# Patient Record
Sex: Male | Born: 1975
Health system: Southern US, Community
[De-identification: ages and names within clinical notes are randomized; demographics above are authoritative.]

## PROBLEM LIST (undated history)

## (undated) DIAGNOSIS — E781 Pure hyperglyceridemia: Secondary | ICD-10-CM

## (undated) DIAGNOSIS — G4730 Sleep apnea, unspecified: Secondary | ICD-10-CM

## (undated) DIAGNOSIS — F419 Anxiety disorder, unspecified: Secondary | ICD-10-CM

## (undated) DIAGNOSIS — C4490 Unspecified malignant neoplasm of skin, unspecified: Secondary | ICD-10-CM

## (undated) DIAGNOSIS — F32A Depression, unspecified: Secondary | ICD-10-CM

## (undated) DIAGNOSIS — K859 Acute pancreatitis without necrosis or infection, unspecified: Secondary | ICD-10-CM

## (undated) DIAGNOSIS — I10 Essential (primary) hypertension: Secondary | ICD-10-CM

## (undated) DIAGNOSIS — B019 Varicella without complication: Secondary | ICD-10-CM

## (undated) DIAGNOSIS — F329 Major depressive disorder, single episode, unspecified: Secondary | ICD-10-CM

## (undated) HISTORY — PX: LAPAROSCOPIC GASTRIC SLEEVE RESECTION: SHX5895

## (undated) HISTORY — PX: VASECTOMY: SHX75

## (undated) HISTORY — DX: Sleep apnea, unspecified: G47.30

## (undated) HISTORY — DX: Essential (primary) hypertension: I10

## (undated) HISTORY — PX: CHOLECYSTECTOMY: SHX55

## (undated) HISTORY — DX: Varicella without complication: B01.9

---

## 1997-10-11 DIAGNOSIS — G4730 Sleep apnea, unspecified: Secondary | ICD-10-CM

## 1997-10-11 HISTORY — DX: Sleep apnea, unspecified: G47.30

## 2001-10-11 HISTORY — PX: TONSILLECTOMY: SUR1361

## 2005-09-16 ENCOUNTER — Ambulatory Visit: Payer: Self-pay | Admitting: Urology

## 2006-10-11 HISTORY — PX: LUMBAR DISC SURGERY: SHX700

## 2007-02-20 ENCOUNTER — Emergency Department: Payer: Self-pay | Admitting: Emergency Medicine

## 2007-02-21 ENCOUNTER — Ambulatory Visit: Payer: Self-pay | Admitting: Family Medicine

## 2008-03-02 ENCOUNTER — Emergency Department: Payer: Self-pay | Admitting: Emergency Medicine

## 2008-03-04 ENCOUNTER — Inpatient Hospital Stay: Payer: Self-pay | Admitting: Internal Medicine

## 2010-09-27 ENCOUNTER — Inpatient Hospital Stay: Payer: Self-pay | Admitting: Internal Medicine

## 2011-05-11 ENCOUNTER — Inpatient Hospital Stay: Payer: Self-pay | Admitting: Student

## 2011-05-12 LAB — HM DIABETES EYE EXAM

## 2011-07-28 ENCOUNTER — Ambulatory Visit (INDEPENDENT_AMBULATORY_CARE_PROVIDER_SITE_OTHER): Payer: PRIVATE HEALTH INSURANCE | Admitting: Internal Medicine

## 2011-07-28 ENCOUNTER — Encounter: Payer: Self-pay | Admitting: Internal Medicine

## 2011-07-28 VITALS — BP 140/86 | HR 82 | Temp 98.9°F | Wt 247.0 lb

## 2011-07-28 DIAGNOSIS — G4730 Sleep apnea, unspecified: Secondary | ICD-10-CM | POA: Insufficient documentation

## 2011-07-28 DIAGNOSIS — F419 Anxiety disorder, unspecified: Secondary | ICD-10-CM

## 2011-07-28 DIAGNOSIS — E781 Pure hyperglyceridemia: Secondary | ICD-10-CM

## 2011-07-28 DIAGNOSIS — Z23 Encounter for immunization: Secondary | ICD-10-CM

## 2011-07-28 DIAGNOSIS — I10 Essential (primary) hypertension: Secondary | ICD-10-CM

## 2011-07-28 DIAGNOSIS — F411 Generalized anxiety disorder: Secondary | ICD-10-CM

## 2011-07-28 DIAGNOSIS — E119 Type 2 diabetes mellitus without complications: Secondary | ICD-10-CM

## 2011-07-28 MED ORDER — ALPRAZOLAM 0.5 MG PO TABS: 0.5000 mg | ORAL_TABLET | Freq: Every evening | ORAL | Status: AC | PRN

## 2011-07-28 MED ORDER — SERTRALINE HCL 50 MG PO TABS: 50.0000 mg | ORAL_TABLET | Freq: Every day | ORAL | Status: DC

## 2011-07-28 NOTE — Patient Instructions (Signed)
Start with 1/2 tab of sertraline daily for one week, then increase to a whole tablet.  Start with 1/2 tablet daily of alprazolam for intense emotional periods

## 2011-07-28 NOTE — Progress Notes (Signed)
  Subjective:    Patient ID: Jeanpaul Biehl, male    DOB: 1976/08/22, 35 y.o.   MRN: [id number]  HPI    Review of Systems     Objective:   Physical Exam        Assessment & Plan:   Subjective:     Ceaser Ebeling is a 35 y.o. male who presents for new evaluation and treatment of anxiety disorder. He has the following anxiety symptoms: fatigue, feelings of losing control, insomnia and irritable. Onset of symptoms was approximately 3 months ago. Symptoms have been gradually worsening since that time. He denies current suicidal and homicidal ideation. Family history significant for no psychiatric illness. Risk factors: none. Previous treatment includes none. He complains of the following medication side effects: none. The following portions of the patient's history were reviewed and updated as appropriate: allergies, current medications, past family history, past social history, past surgical history and problem list.  Review of Systems A comprehensive review of systems was negative.    Objective:    BP 140/86  Pulse 82  Temp(Src) 98.9 F (37.2 C) (Oral)  Wt 247 lb (112.038 kg)  SpO2 97% General appearance: alert, cooperative and appears stated age Head: Normocephalic, without obvious abnormality, atraumatic Eyes: conjunctivae/corneas clear. PERRL, EOM's intact. Fundi benign. Neck: no adenopathy, no carotid bruit, no JVD, supple, symmetrical, trachea midline and thyroid not enlarged, symmetric, no tenderness/mass/nodules Lungs: clear to auscultation bilaterally Heart: regular rate and rhythm, S1, S2 normal, no murmur, click, rub or gallop and regular rate and rhythm Abdomen: soft, non-tender; bowel sounds normal; no masses,  no organomegaly Extremities: extremities normal, atraumatic, no cyanosis or edema    Assessment:    anxiety disorder. Possible organic contributing causes are: endocrine/metabolic, nutritional.   Plan:    Medications: benzodiazepines alprazolam and  Zoloft.

## 2011-07-31 ENCOUNTER — Encounter: Payer: Self-pay | Admitting: Internal Medicine

## 2011-07-31 DIAGNOSIS — E781 Pure hyperglyceridemia: Secondary | ICD-10-CM | POA: Insufficient documentation

## 2011-07-31 DIAGNOSIS — E1149 Type 2 diabetes mellitus with other diabetic neurological complication: Secondary | ICD-10-CM | POA: Insufficient documentation

## 2011-07-31 DIAGNOSIS — IMO0002 Reserved for concepts with insufficient information to code with codable children: Secondary | ICD-10-CM | POA: Insufficient documentation

## 2011-07-31 DIAGNOSIS — I10 Essential (primary) hypertension: Secondary | ICD-10-CM | POA: Insufficient documentation

## 2011-07-31 NOTE — Assessment & Plan Note (Signed)
He takes fenofibrate for hypertriglyceridema.  fasting lipids due prior to next visit.

## 2011-07-31 NOTE — Assessment & Plan Note (Addendum)
Mildly elevated today, but he is very anxious.  Will reevaluate at next visit.

## 2011-07-31 NOTE — Assessment & Plan Note (Signed)
He is due for hgba1c priro to next visit

## 2011-08-30 ENCOUNTER — Encounter: Payer: Self-pay | Admitting: Internal Medicine

## 2011-08-30 ENCOUNTER — Ambulatory Visit (INDEPENDENT_AMBULATORY_CARE_PROVIDER_SITE_OTHER): Payer: PRIVATE HEALTH INSURANCE | Admitting: Internal Medicine

## 2011-08-30 DIAGNOSIS — E119 Type 2 diabetes mellitus without complications: Secondary | ICD-10-CM

## 2011-08-30 DIAGNOSIS — F411 Generalized anxiety disorder: Secondary | ICD-10-CM

## 2011-08-30 DIAGNOSIS — F419 Anxiety disorder, unspecified: Secondary | ICD-10-CM

## 2011-08-30 DIAGNOSIS — E781 Pure hyperglyceridemia: Secondary | ICD-10-CM

## 2011-08-30 MED ORDER — ALPRAZOLAM 0.5 MG PO TABS: 0.5000 mg | ORAL_TABLET | Freq: Every evening | ORAL | Status: DC | PRN

## 2011-08-30 NOTE — Assessment & Plan Note (Signed)
Managed by Dr. Eligah East at [facility] next one due in December

## 2011-08-30 NOTE — Patient Instructions (Signed)
First let's try taking zoloft in the morning with breakfast.  If this doesn't help, you may use the alprazolam at night as needed

## 2011-08-30 NOTE — Assessment & Plan Note (Signed)
Severe, with elevations to 550 0 aggravated by loss of glycemic control.  Managed by Hulda Marin at [facility] clinic with lovaza and gemfibrozil

## 2011-08-30 NOTE — Progress Notes (Signed)
Subjective:    Patient ID: Sergio [name], male    DOB: 1976-05-01, 35 y.o.   MRN: [id number]  HPIMr. Poe retursn for followup on initiation of SSRI therapy foe generalized anxiety.  He was started on zoloft and prn alprazolam one month ago and states that the difference in his mood is significant.  He takes the zoloft in the evening and has noted that occasionally he has used the alprzolam at night and has had imporved rest and wonders whtehter this can be continued safely.  He has decreased mood lability, decreased crying,  And decreased irritability with family.  Past Medical History  Diagnosis Date  . Chronic pancreatitis May 2009    secondary to hypertirglyceridemia   . Gastric ulcer 2009    by endo  . Sleep apnea 1999    uses CPAP, auto titrates,  [name]  . Chicken pox   . Diabetes mellitus   . HTN (hypertension)   . Hypertriglyceridemia     severe   Current Outpatient Prescriptions on File Prior to Visit  Medication Sig Dispense Refill  . aspirin EC 81 MG tablet Take 81 mg by mouth daily.        [name] gemfibrozil (LOPID) 600 MG tablet Take 600 mg by mouth 2 (two) times daily before a meal.        . insulin aspart (NOVOLOG) 100 UNIT/ML injection Inject into the skin 3 (three) times daily before meals. 12 units - for blood sugar 25 points over 150 take 2 additional units       . insulin glargine (LANTUS SOLOSTAR) 100 UNIT/ML injection Inject 45 Units into the skin at bedtime.        [name] lisinopril (PRINIVIL,ZESTRIL) 20 MG tablet Take 20 mg by mouth daily.        . metFORMIN (GLUCOPHAGE) 1000 MG tablet Take 1,000 mg by mouth 2 (two) times daily with a meal.        . omega-3 acid ethyl esters (LOVAZA) 1 G capsule Take 4 g by mouth daily.        [name] omeprazole (PRILOSEC) 20 MG capsule Take 20 mg by mouth daily.        . sertraline (ZOLOFT) 50 MG tablet Take 1 tablet (50 mg total) by mouth daily.  30 tablet  2    Review of Systems  Constitutional: Negative for fever, chills, diaphoresis,  activity change, appetite change, fatigue and unexpected weight change.  HENT: Negative for hearing loss, ear pain, nosebleeds, congestion, sore throat, facial swelling, rhinorrhea, sneezing, drooling, mouth sores, trouble swallowing, neck pain, neck stiffness, dental problem, voice change, postnasal drip, sinus pressure, tinnitus and ear discharge.   Eyes: Negative for photophobia, pain, discharge, redness, itching and visual disturbance.  Respiratory: Negative for apnea, cough, choking, chest tightness, shortness of breath, wheezing and stridor.   Cardiovascular: Negative for chest pain, palpitations and leg swelling.  Gastrointestinal: Negative for nausea, vomiting, abdominal pain, diarrhea, constipation, blood in stool, abdominal distention, anal bleeding and rectal pain.  Genitourinary: Negative for dysuria, urgency, frequency, hematuria, flank pain, decreased urine volume, scrotal swelling, difficulty urinating and testicular pain.  Musculoskeletal: Negative for myalgias, back pain, joint swelling, arthralgias and gait problem.  Skin: Negative for color change, rash and wound.  Neurological: Negative for dizziness, tremors, seizures, syncope, speech difficulty, weakness, light-headedness, numbness and headaches.  Psychiatric/Behavioral: Negative for suicidal ideas, hallucinations, behavioral problems, confusion, sleep disturbance, dysphoric mood, decreased concentration and agitation. The patient is not nervous/anxious.  Objective:   Physical Exam  Constitutional: He appears well-developed and well-nourished.  Eyes: Pupils are equal, round, and reactive to light.  Psychiatric: He has a normal mood and affect. His speech is normal and behavior is normal. Judgment and thought content normal. Cognition and memory are normal.          Assessment & Plan:  Anxiety:  improved with trial of zoloft 50 mg .  Since he is having mild sleep diminismnet.  Have suggested taking the zoloft in  the morning.  If this does not imporve the sleep, he can use the alrtazolam as needed for improvement in sleep.

## 2011-08-31 ENCOUNTER — Other Ambulatory Visit: Payer: Self-pay | Admitting: Internal Medicine

## 2011-08-31 NOTE — Telephone Encounter (Signed)
Ok to phone in refill of alprazolam

## 2011-11-03 ENCOUNTER — Other Ambulatory Visit: Payer: Self-pay | Admitting: Internal Medicine

## 2011-11-05 DIAGNOSIS — E781 Pure hyperglyceridemia: Secondary | ICD-10-CM | POA: Insufficient documentation

## 2012-03-17 ENCOUNTER — Other Ambulatory Visit: Payer: Self-pay | Admitting: Internal Medicine

## 2012-03-18 MED ORDER — ALPRAZOLAM 0.5 MG PO TABS: 0.5000 mg | ORAL_TABLET | Freq: Every evening | ORAL | Status: DC | PRN

## 2012-04-27 ENCOUNTER — Ambulatory Visit (INDEPENDENT_AMBULATORY_CARE_PROVIDER_SITE_OTHER): Payer: PRIVATE HEALTH INSURANCE | Admitting: Internal Medicine

## 2012-04-27 ENCOUNTER — Encounter: Payer: Self-pay | Admitting: Internal Medicine

## 2012-04-27 VITALS — BP 122/82 | HR 84 | Temp 98.8°F | Ht 70.0 in | Wt 265.0 lb

## 2012-04-27 DIAGNOSIS — E669 Obesity, unspecified: Secondary | ICD-10-CM

## 2012-04-27 DIAGNOSIS — I10 Essential (primary) hypertension: Secondary | ICD-10-CM

## 2012-04-27 DIAGNOSIS — E1165 Type 2 diabetes mellitus with hyperglycemia: Secondary | ICD-10-CM

## 2012-04-27 DIAGNOSIS — F411 Generalized anxiety disorder: Secondary | ICD-10-CM

## 2012-04-27 DIAGNOSIS — E781 Pure hyperglyceridemia: Secondary | ICD-10-CM

## 2012-04-27 DIAGNOSIS — IMO0002 Reserved for concepts with insufficient information to code with codable children: Secondary | ICD-10-CM

## 2012-04-27 DIAGNOSIS — IMO0001 Reserved for inherently not codable concepts without codable children: Secondary | ICD-10-CM

## 2012-04-27 NOTE — Patient Instructions (Addendum)
We are changing from gemfibrozil to trilipix once daily for triglycerides  .  No changes to insulin yet. We need one week of blood sugars  (fasting,  Pre meal and 2 hr post prandial same meal)    E Mail your sugar logs weekly using MyChart   Stay at current dose of zoloft for now.   Consider a Low Glycemic Index Diet and eating 6 smaller meals daily .  This frequent feeding stimulates your metabolism and the lower glycemic index foods will lower your blood sugars:   This is an example of my daily  "Low GI"  Diet:  All of the foods can be found at grocery stores and in bulk at [employer]  club   7 AM Breakfast:  Low carbohydrate Protein  Shakes (I recommend the EAS AdvantEdge "Carb Control" shakes  Or the low carb shakes by Atkins.   Both are available everywhere:  In  cases at [employer]  Or in 4 packs at grocery stores and pharmacies  2.5 carbs  (Alternative is  a toasted Arnold's Sandwhich Thin w/ peanut butter, a "Bagel Thin" with cream cheese and salmon) or  a scrambled egg burrito made with a low carb tortilla .  Avoid cereal and bananas, oatmeal too unless the old fashioned kind that takes 30-40 minutes to prepare.  the rest is overly processed, has minimal fiber, and loaded with carbohydrates!   10 AM: Protein bar by Atkins (the snack size, under 200 cal.  There are many varieties , available widely again or in bulk in limited varieties at [employer])  Other so called "protein bars" tend to be loaded with carbohydrates.  Remember, in food advertising, the word "energy" is synonymous for " carbohydrate."  Lunch: sandwich of [place], (or any lunchmeat or canned tuna), fresh avocado and cheese on a lower carbohydrate pita bread, flatbread, or tortilla . Ok to use mayonnaise. The bread is the only source or carbohydrate that can be decreased (Joseph's makes a pita bread and a flat bread  Are 50 cal and 4 net carbs ; Toufayan makes a low carb flatbread 100 cal and 9 net carbs  and  Mission makes a low carb whole wheat  tortilla  210 cal and 6 net carbs)  3 PM:  Mid day :  Another proteintttt bThe brear,  Or a  cheese stick (100 cal, 0 carbs),  Or 1 ounce of  almonds, walnuts, pistachios, pecans, peanuts,  Macadamia nuts. Or a Dannon light n Fit greek yogurt, 80 cal 8 net carbs . Avoid "granola"; the dried cranberries and raisins are loaded with carbohydrates.    6 PM  Dinner:  "mean and green:"  Meat/chicken/fish or a high protein legume; , with a green salad, and a low GI  Veggie (broccoli, cauliflower, green beans, spinach, brussel sprouts. Lima beans) : Avoid "Low fat dressings, Reyne Dumas and [address]! They are loaded with sugar! Instead use ranch, vinagrette,  Blue cheese, etc  9 PM snack : Breyer's "low carb" fudgsicle or  ice cream bar (Carb Smart line), or  Weight Watcher's ice cream bar , or anouther "no sugar added" ice cream; or another protein shake or a serving of fresh fruit with whipped cream (Avoid bananas, pineapple, grapes  and watermelon on a regular basis because they are high in sugar)   Remember that snack Substitutions should be less than 15 to 20 carbs  Per serving. Remember to subtract fiber grams to get the "net carbs."

## 2012-04-28 LAB — COMPREHENSIVE METABOLIC PANEL
ALT: 28 IU/L (ref 0–44)
AST: 18 IU/L (ref 0–40)
Albumin/Globulin Ratio: 1.4 (ref 1.1–2.5)
Alkaline Phosphatase: 48 IU/L (ref 44–102)
BUN/Creatinine Ratio: 12 (ref 8–19)
CO2: 19 mmol/L (ref 19–28)
Creatinine, Ser: 0.81 mg/dL (ref 0.76–1.27)
GFR calc non Af Amer: 116 mL/min/{1.73_m2} (ref >59)
Globulin, Total: 3.2 g/dL (ref 1.5–4.5)
Potassium: 4.4 mmol/L (ref 3.5–5.2)
Sodium: 138 mmol/L (ref 134–144)

## 2012-04-28 LAB — LIPID PANEL: HDL: 60 mg/dL (ref >39)

## 2012-04-28 LAB — MICROALBUMIN, URINE: Microalbumin, Urine: 126.9 ug/mL — ABNORMAL HIGH (ref 0.0–17.0)

## 2012-04-28 LAB — HEMOGLOBIN A1C
Est. average glucose Bld gHb Est-mCnc: 212 mg/dL
Hgb A1c MFr Bld: 9 % — ABNORMAL HIGH (ref 4.8–5.6)

## 2012-04-29 ENCOUNTER — Encounter: Payer: Self-pay | Admitting: Internal Medicine

## 2012-04-29 DIAGNOSIS — F411 Generalized anxiety disorder: Secondary | ICD-10-CM | POA: Insufficient documentation

## 2012-04-29 DIAGNOSIS — E669 Obesity, unspecified: Secondary | ICD-10-CM | POA: Insufficient documentation

## 2012-04-29 DIAGNOSIS — F419 Anxiety disorder, unspecified: Secondary | ICD-10-CM | POA: Insufficient documentation

## 2012-04-29 NOTE — Assessment & Plan Note (Signed)
I have addressed  BMI and recommended a low glycemic index diet utilizing smaller more frequent meals to increase metabolism.  I have also recommended that patient start exercising with a goal of 30 minutes of aerobic exercise a minimum of 5 days per week.  

## 2012-04-29 NOTE — Assessment & Plan Note (Signed)
Well-controlled. No medication changes today.

## 2012-04-29 NOTE — Progress Notes (Signed)
Patient ID: Sergio [name], male   DOB: [birth year], 37 y.o.   MRN: [id number]  Patient Active Problem List  Diagnosis  . Sleep apnea  . Diabetes mellitus type 2, uncontrolled  . Hypertriglyceridemia  . HTN (hypertension)    Subjective:  CC:   Chief Complaint  Patient presents with  . Follow-up    HPI:   Sergio [name] a 36 y.o. male who presents Uncontrolled diabetes and hypertriglyceridemia. Sergio [name] is accompanied by his wife Sergio [name] who is gravely concerned that his failure to control his diabetes and hyper lipidemia is going to cause his death. Both conditions are managed by Dr. Eligah East at [facility] the patient has not kept his follow up appointments with him. According to Sergio [name] he has been hospitalized several times for severe hypertriglyceridemia it causing pancreatitis. He was last seen by me in November for management of depression and was started on Zoloft. We brought him in today to have a family discussion. I suggested to him that the reason is not taking care of his illnesses is due to some underlying unresolved issue or in adequately treated depression. He reports today that he had been behaving in a passively suicidal way by not taking care of his diabetes and hypertriglyceridemia. He had been not himself or something he had done in during the past week he and his wife have discussed whatever this thing was and are getting past it.  He is remorseful today and crying a bit but appears to be emotionally stable.     Past Medical History  Diagnosis Date  . Chronic pancreatitis May 2009    secondary to hypertirglyceridemia   . Gastric ulcer 2009    by endo  . Sleep apnea 1999    uses CPAP, auto titrates,  Sergio [name])  . Chicken pox   . Diabetes mellitus   . HTN (hypertension)   . Hypertriglyceridemia     severe    Past Surgical History  Procedure Date  . Tonsillectomy 2003  . Vasectomy   . Lumbar disc surgery 2008         The following portions of the patient's history were  reviewed and updated as appropriate: Allergies, current medications, and problem list.    Review of Systems:   12 Pt  review of systems was negative except those addressed in the HPI,     History   Social History  . Marital Status: Married    Spouse Name: N/A    Number of Children: 3  . Years of Education: N/A   Occupational History  . Surveyor, minerals    Social History Main Topics  . Smoking status: Never Smoker   . Smokeless tobacco: Never Used  . Alcohol Use: Yes     once month  . Drug Use: No  . Sexually Active: Not on file   Other Topics Concern  . Not on file   Social History Narrative   Daily Caffeine Use:  2-3 week/SodaRegular Exercise -  NO    Objective:  BP 122/82  Pulse 84  Temp 98.8 F (37.1 C) (Oral)  Ht 5\' 10"  (1.778 m)  Wt 265 lb (120.203 kg)  BMI 38.02 kg/m2  SpO2 97%  General appearance: alert, cooperative and appears stated age Ears: normal TM's and external ear canals both ears Throat: lips, mucosa, and tongue normal; teeth and gums normal Neck: no adenopathy, no carotid bruit, supple, symmetrical, trachea midline and thyroid not enlarged, symmetric, no tenderness/mass/nodules Back:  symmetric, no curvature. ROM normal. No CVA tenderness. Lungs: clear to auscultation bilaterally Heart: regular rate and rhythm, S1, S2 normal, no murmur, click, rub or gallop Abdomen: soft, non-tender; bowel sounds normal; no masses,  no organomegaly Pulses: 2+ and symmetric Skin: Skin color, texture, turgor normal. No rashes or lesions Lymph nodes: Cervical, supraclavicular, and axillary nodes normal. Foot exam:  Nails are well trimmed,  No callouses,  Sensation intact to microfilament  Assessment and Plan:  HTN (hypertension) Well-controlled. No medication changes today.  Diabetes mellitus type 2, uncontrolled His Hemoglobin A1c is 9.0 on current regimen. We will not change his dose until we see a  week of blood sugars both fasting and pre-and post prandial.  He is taking metformin Lantus NovoLog sliding scale baby aspirin ACE inhibitor. Foot exam was done today. He has normal sensation.  Hypertriglyceridemia Severe with triglycerides of 1689 today. He was previously taking gemfibrozil.   I have given him samples of Trilipix which use to take daily and return in 6 weeks for repeat fasting lipids  Generalized anxiety disorder Improved since his reconciled with his wife over whatever incident he did in the past that has caused him so much emotional anguish  We will continue the Zoloft and and alprazolam for now.  Obesity (BMI 30-39.9) I have addressed  BMI and recommended a low glycemic index diet utilizing smaller more frequent meals to increase metabolism.  I have also recommended that patient start exercising with a goal of 30 minutes of aerobic exercise a minimum of 5 days per week.     Updated Medication List Outpatient Encounter Prescriptions as of 04/27/2012  Medication Sig Dispense Refill  . ALPRAZolam (XANAX) 0.5 MG tablet Take 1 tablet (0.5 mg total) by mouth at bedtime as needed.  30 tablet  5  . aspirin EC 81 MG tablet Take 81 mg by mouth daily.        . carvedilol (COREG) 6.25 MG tablet Take 6.25 mg by mouth 2 (two) times daily with a meal.        . insulin aspart (NOVOLOG) 100 UNIT/ML injection Inject into the skin 3 (three) times daily before meals. 12 units - for blood sugar 25 points over 150 take 2 additional units       . insulin glargine (LANTUS SOLOSTAR) 100 UNIT/ML injection Inject 45 Units into the skin at bedtime.        [name] lisinopril (PRINIVIL,ZESTRIL) 20 MG tablet Take 20 mg by mouth daily.        . metFORMIN (GLUCOPHAGE) 1000 MG tablet Take 1,000 mg by mouth 2 (two) times daily with a meal.        . omega-3 acid ethyl esters (LOVAZA) 1 G capsule Take 4 g by mouth daily.        [name] omeprazole (PRILOSEC) 20 MG capsule Take 20 mg by mouth daily.        . sertraline  (ZOLOFT) 50 MG tablet TAKE 1 TABLET EVERY DAY  30 tablet  6  . DISCONTD: gemfibrozil (LOPID) 600 MG tablet Take 600 mg by mouth 2 (two) times daily before a meal.       . DISCONTD: ALPRAZolam (XANAX) 0.5 MG tablet Take 1 tablet (0.5 mg total) by mouth at bedtime as needed.  30 tablet  5  . DISCONTD: ALPRAZolam (XANAX) 0.5 MG tablet TAKE ONE TABLET AT BEDTIME AS NEEDED    FOR SLEEP  30 tablet  3

## 2012-04-29 NOTE — Assessment & Plan Note (Signed)
Severe with triglycerides of 1689 today. He was previously taking gemfibrozil.   I have given him samples of Trilipix which use to take daily and return in 6 weeks for repeat fasting lipids

## 2012-04-29 NOTE — Assessment & Plan Note (Addendum)
His Hemoglobin A1c is 9.0 on current regimen. We will not change his dose until we see a week of blood sugars both fasting and pre-and post prandial.  He is taking metformin Lantus NovoLog sliding scale baby aspirin ACE inhibitor. Foot exam was done today. He has normal sensation.

## 2012-04-29 NOTE — Assessment & Plan Note (Signed)
Improved since his reconciled with his wife over whatever incident he did in the past that has caused him so much emotional anguish  We will continue the Zoloft and and alprazolam for now.

## 2012-04-30 ENCOUNTER — Encounter: Payer: Self-pay | Admitting: Internal Medicine

## 2012-05-29 ENCOUNTER — Ambulatory Visit (INDEPENDENT_AMBULATORY_CARE_PROVIDER_SITE_OTHER): Payer: PRIVATE HEALTH INSURANCE | Admitting: Internal Medicine

## 2012-05-29 ENCOUNTER — Encounter: Payer: Self-pay | Admitting: Internal Medicine

## 2012-05-29 VITALS — BP 110/70 | HR 80 | Temp 97.6°F | Resp 18 | Wt 249.0 lb

## 2012-05-29 DIAGNOSIS — R197 Diarrhea, unspecified: Secondary | ICD-10-CM

## 2012-05-29 DIAGNOSIS — K859 Acute pancreatitis without necrosis or infection, unspecified: Secondary | ICD-10-CM | POA: Insufficient documentation

## 2012-05-29 DIAGNOSIS — Z8719 Personal history of other diseases of the digestive system: Secondary | ICD-10-CM

## 2012-05-29 DIAGNOSIS — IMO0002 Reserved for concepts with insufficient information to code with codable children: Secondary | ICD-10-CM

## 2012-05-29 DIAGNOSIS — E1165 Type 2 diabetes mellitus with hyperglycemia: Secondary | ICD-10-CM

## 2012-05-29 DIAGNOSIS — IMO0001 Reserved for inherently not codable concepts without codable children: Secondary | ICD-10-CM

## 2012-05-29 MED ORDER — SERTRALINE HCL 100 MG PO TABS: 100.0000 mg | ORAL_TABLET | Freq: Every day | ORAL | Status: DC

## 2012-05-29 NOTE — Assessment & Plan Note (Signed)
Recent episode May 21 2012, admitted to [facility] , secondary to hypertriglyceridemia. Fenofribrate was changed to gemfibrozil, likely bc Sergio [name] presumed his lipid had not responded to Trilipix which he had only been taking since July 21

## 2012-05-29 NOTE — Patient Instructions (Signed)
  Increase the baseline aspart  dose from 12 units to 15 units as the base.   Stop the additional  Once your post prandials area all under 150 .  If you start having lows,  Reduce the baseline dose to 12 units again   Th alternative is to leave the base at 12 units and increase the Lantus to 50 units

## 2012-05-29 NOTE — Assessment & Plan Note (Signed)
hgba1c was 9.0 on July 21.  Will increase Lantus to 50 units from 45 given current post prandials in the 200 to 230 range.  continue sliding scale Apidra tid.

## 2012-05-29 NOTE — Assessment & Plan Note (Signed)
Secondary to loss of exocrine function, hopefully temporarily.  Advised to call if not resumed in a week or two.

## 2012-05-29 NOTE — Progress Notes (Signed)
Patient ID: Sergio [name], male   DOB: 1976/05/22, 36 y.o.   MRN: [id number]   Patient Active Problem List  Diagnosis  . Sleep apnea  . Diabetes mellitus type 2, uncontrolled  . Hypertriglyceridemia  . HTN (hypertension)  . Generalized anxiety disorder  . Obesity (BMI 30-39.9)  . Diarrhea  . History of pancreatitis    Subjective:  CC:   Chief Complaint  Patient presents with  . Follow-up    Hospital    HPI:   Sergio [name] a 36 y.o. male who presents Sergio [name] was admitted to [facility] one week ago yesterday for pancreatitis secondary to hypertriglyceridemia. He presented with severe abdominal pain and nausea without diuresis. He spent 4 days in the hospital on IV fluids and IV Dilaudid and had a case of respiratory depression secondary to overuse of his dilaudid PCA. He was discharged home on Thursday but has been staying with his parents since then. His medications were changed from Trilipix to gemfibrozil. He has an appointment with his lipidologist in the next 2-3 weeks. He has been having some postprandial diarrhea but not after every meal.  he has had pancreatitis in the past and the diarrhea has been a temporary condition.   His blood sugars have been ranging from from 150 fasting,  to 200 male and 250 postprandially.   He is using 45 units of Lantus daily and short acting insulin prior to each meal starting with 12 units at baseline and using a sliding scale for hyperglycemia.      Past Medical History  Diagnosis Date  . Chronic pancreatitis May 2009    secondary to hypertirglyceridemia   . Gastric ulcer 2009    by endo  . Sleep apnea 1999    uses CPAP, auto titrates,  Sergio [name])  . Chicken pox   . Diabetes mellitus   . HTN (hypertension)   . Hypertriglyceridemia     severe    Past Surgical History  Procedure Date  . Tonsillectomy 2003  . Vasectomy   . Lumbar disc surgery 2008         The following portions of the patient's history were  reviewed and updated as appropriate: Allergies, current medications, and problem list.    Review of Systems:   12 Pt  review of systems was negative except those addressed in the HPI,     History   Social History  . Marital Status: Married    Spouse Name: N/A    Number of Children: 3  . Years of Education: N/A   Occupational History  . Surveyor, minerals    Social History Main Topics  . Smoking status: Never Smoker   . Smokeless tobacco: Never Used  . Alcohol Use: Yes     once month  . Drug Use: No  . Sexually Active: Not on file   Other Topics Concern  . Not on file   Social History Narrative   Daily Caffeine Use:  2-3 week/SodaRegular Exercise -  NO    Objective:  BP 110/70  Pulse 80  Temp 97.6 F (36.4 C) (Oral)  Resp 18  Wt 249 lb (112.946 kg)  SpO2 97%  General appearance: alert, cooperative and appears stated age Ears: normal TM's and external ear canals both ears Throat: lips, mucosa, and tongue normal; teeth and gums normal Neck: no adenopathy, no carotid bruit, supple, symmetrical, trachea midline and thyroid not enlarged, symmetric, no tenderness/mass/nodules Back: symmetric, no  curvature. ROM normal. No CVA tenderness. Lungs: clear to auscultation bilaterally Heart: regular rate and rhythm, S1, S2 normal, no murmur, click, rub or gallop Abdomen: soft, non-tender; bowel sounds normal; no masses,  no organomegaly Pulses: 2+ and symmetric Skin: Skin color, texture, turgor normal. No rashes or lesions Lymph nodes: Cervical, supraclavicular, and axillary nodes normal.  Assessment and Plan:  History of pancreatitis Recent episode May 21 2012, admitted to [facility] , secondary to hypertriglyceridemia. Fenofribrate was changed to gemfibrozil, likely bc Sergio [name] presumed his lipid had not responded to Trilipix which he had only been taking since July 21   Diabetes mellitus type 2, uncontrolled hgba1c was  9.0 on July 21.  Will increase Lantus to 50 units from 45 given current post prandials in the 200 to 230 range.  continue sliding scale Apidra tid.   Diarrhea Secondary to loss of exocrine function, hopefully temporarily.  Advised to call if not resumed in a week or two.    Updated Medication List Outpatient Encounter Prescriptions as of 05/29/2012  Medication Sig Dispense Refill  . ALPRAZolam (XANAX) 0.5 MG tablet Take 1 tablet (0.5 mg total) by mouth at bedtime as needed.  30 tablet  5  . aspirin EC 81 MG tablet Take 81 mg by mouth daily.        . carvedilol (COREG) 6.25 MG tablet Take 6.25 mg by mouth 2 (two) times daily with a meal.        . gemfibrozil (LOPID) 600 MG tablet Take 600 mg by mouth 2 (two) times daily before a meal.      . insulin aspart (NOVOLOG) 100 UNIT/ML injection Inject into the skin 3 (three) times daily before meals. 12 units - for blood sugar 25 points over 150 take 2 additional units       . insulin glargine (LANTUS SOLOSTAR) 100 UNIT/ML injection Inject 45 Units into the skin at bedtime.        [name] lisinopril (PRINIVIL,ZESTRIL) 20 MG tablet Take 20 mg by mouth daily.        . metFORMIN (GLUCOPHAGE) 1000 MG tablet Take 1,000 mg by mouth 2 (two) times daily with a meal.        . omega-3 acid ethyl esters (LOVAZA) 1 G capsule Take 4 g by mouth daily.        [name] omeprazole (PRILOSEC) 20 MG capsule Take 20 mg by mouth daily.        . sertraline (ZOLOFT) 100 MG tablet Take 1 tablet (100 mg total) by mouth daily.  90 tablet  3  . DISCONTD: sertraline (ZOLOFT) 50 MG tablet TAKE 1 TABLET EVERY DAY  30 tablet  6

## 2012-06-30 ENCOUNTER — Ambulatory Visit (INDEPENDENT_AMBULATORY_CARE_PROVIDER_SITE_OTHER): Payer: PRIVATE HEALTH INSURANCE | Admitting: Internal Medicine

## 2012-06-30 ENCOUNTER — Encounter: Payer: Self-pay | Admitting: Internal Medicine

## 2012-06-30 VITALS — BP 140/80 | HR 88 | Temp 98.0°F | Resp 16 | Wt 258.5 lb

## 2012-06-30 DIAGNOSIS — R197 Diarrhea, unspecified: Secondary | ICD-10-CM

## 2012-06-30 DIAGNOSIS — IMO0002 Reserved for concepts with insufficient information to code with codable children: Secondary | ICD-10-CM

## 2012-06-30 DIAGNOSIS — Z8719 Personal history of other diseases of the digestive system: Secondary | ICD-10-CM

## 2012-06-30 DIAGNOSIS — IMO0001 Reserved for inherently not codable concepts without codable children: Secondary | ICD-10-CM

## 2012-06-30 DIAGNOSIS — E1165 Type 2 diabetes mellitus with hyperglycemia: Secondary | ICD-10-CM

## 2012-06-30 DIAGNOSIS — I10 Essential (primary) hypertension: Secondary | ICD-10-CM

## 2012-06-30 DIAGNOSIS — E781 Pure hyperglyceridemia: Secondary | ICD-10-CM

## 2012-06-30 LAB — LIPID PANEL: Total CHOL/HDL Ratio: 23

## 2012-06-30 NOTE — Assessment & Plan Note (Signed)
Improved by report of recent  blood sugars. He is not due for another  A1c until late October early November. He will return in December for followup.

## 2012-06-30 NOTE — Assessment & Plan Note (Signed)
Secondary to hypertriglyceridemia. Hospitalized at [facility] several months ago.  Sergio [name] He's currently asymptomatic but remains at high risk due to his hypertriglyceridemia.

## 2012-06-30 NOTE — Patient Instructions (Addendum)
I have given you samples of creon,  Which is a pancreatic enzyme supplement you take before each meal if you have diarrhea related to eating

## 2012-06-30 NOTE — Assessment & Plan Note (Signed)
Resolved, likely secondary to transient loss of exocrine function during pancreatitis episode. I have given her samples of Creon to use if he has  a recurrence of postprandial diarrhea.

## 2012-06-30 NOTE — Progress Notes (Signed)
Patient ID: Sergio [name], male   DOB: 14-May-1976, 36 y.o.   MRN: [id number]  Patient Active Problem List  Diagnosis  . Sleep apnea  . Diabetes mellitus type 2, uncontrolled  . Hypertriglyceridemia  . HTN (hypertension)  . Generalized anxiety disorder  . Obesity (BMI 30-39.9)  . Diarrhea  . History of pancreatitis    Subjective:  CC:   Chief Complaint  Patient presents with  . Follow-up    HPI:   Sergio [name] a 36 y.o. male who presents 1 month follow up on anxiety  Uncontrolled diabetes, hypertriglyceridemia and diarrhea. His diarrhea has completely resolved. His blood sugars are under better control and he reports that his morning sugars are averaging 1:30 to 140 with no hypoglycemic events. His pre-meal CBGs have been under 200 and he has rarely required more than 12 units of short acting before meals. He has gained some weight. His lipidologist, Dr. Eligah East wants him to follow a fat-free diet and reduce the size of his meals in order to lose weight. He says a fat-free diet is necessary because he is lacking lipase so he cannot metabolize the fat. His weight loss goal is been defined as 2 pounds per month per Dr. Eligah East.  He is  is requesting fasting lipids to be done today. morning sugars 130 to 140,  No lows.,  Pre meals have rarely required more than 12 units .  Has gained weight.  Dr. Eligah East wants him on a fat free diet and portion control.  Says he is lacking lipase so he cannot metabolize the fat. With goal of 2 lbs per month.  Regarding his depression he feels dramatically better on 100 mg of Zoloft. His wife and children going on vacation next week and he affect is markedly better today.  He is making good eye contact. He states that he is using alprazolam very infrequently less than 3 times per week. He is tolerating the Zoloft well.   Past Medical History  Diagnosis Date  . Chronic pancreatitis May 2009    secondary to hypertirglyceridemia   . Gastric ulcer 2009    by endo   . Sleep apnea 1999    uses CPAP, auto titrates,  [name]  . Chicken pox   . Diabetes mellitus   . HTN (hypertension)   . Hypertriglyceridemia     severe    Past Surgical History  Procedure Date  . Tonsillectomy 2003  . Vasectomy   . Lumbar disc surgery 2008         The following portions of the patient's history were reviewed and updated as appropriate: Allergies, current medications, and problem list.    Review of Systems:   12 Pt  review of systems was negative except those addressed in the HPI,     History   Social History  . Marital Status: Married    Spouse Name: N/A    Number of Children: 3  . Years of Education: N/A   Occupational History  . Surveyor, minerals    Social History Main Topics  . Smoking status: Never Smoker   . Smokeless tobacco: Never Used  . Alcohol Use: Yes     once month  . Drug Use: No  . Sexually Active: Not on file   Other Topics Concern  . Not on file   Social History Narrative   Daily Caffeine Use:  2-3 week/SodaRegular Exercise -  NO    Objective:  BP  140/80  Pulse 88  Temp 98 F (36.7 C) (Oral)  Resp 16  Wt 258 lb 8 oz (117.255 kg)  SpO2 97%  General appearance: alert, cooperative and appears stated age Ears: normal TM's and external ear canals both ears Throat: lips, mucosa, and tongue normal; teeth and gums normal Neck: no adenopathy, no carotid bruit, supple, symmetrical, trachea midline and thyroid not enlarged, symmetric, no tenderness/mass/nodules Back: symmetric, no curvature. ROM normal. No CVA tenderness. Lungs: clear to auscultation bilaterally Heart: regular rate and rhythm, S1, S2 normal, no murmur, click, rub or gallop Abdomen: soft, non-tender; bowel sounds normal; no masses,  no organomegaly Pulses: 2+ and symmetric Skin: Skin color, texture, turgor normal. No rashes or lesions Lymph nodes: Cervical, supraclavicular, and axillary nodes  normal.  Assessment and Plan:  Diabetes mellitus type 2, uncontrolled Improved by report of recent  blood sugars. He is not due for another  A1c until late October early November. He will return in December for followup.  Hypertriglyceridemia Lipids were retested today and his manual triglycerides are 10,160.  He is taking gemfibrozil and has been instructed to follow a fat free diet by Dr. Eligah East. He remains at high risk for recurrent pancreatitis.  HTN (hypertension) well controlled   Diarrhea Resolved, likely secondary to transient loss of exocrine function during pancreatitis episode. I have given her samples of Creon to use if he has  a recurrence of postprandial diarrhea.  History of pancreatitis Secondary to hypertriglyceridemia. Hospitalized at [facility] several months ago.  [name] He's currently asymptomatic but remains at high risk due to his hypertriglyceridemia.   Updated Medication List Outpatient Encounter Prescriptions as of 06/30/2012  Medication Sig Dispense Refill  . ALPRAZolam (XANAX) 0.5 MG tablet Take 1 tablet (0.5 mg total) by mouth at bedtime as needed.  30 tablet  5  . aspirin EC 81 MG tablet Take 81 mg by mouth daily.        . carvedilol (COREG) 6.25 MG tablet Take 6.25 mg by mouth 2 (two) times daily with a meal.        . gemfibrozil (LOPID) 600 MG tablet Take 600 mg by mouth 2 (two) times daily before a meal.      . insulin aspart (NOVOLOG) 100 UNIT/ML injection Inject into the skin 3 (three) times daily before meals. 12 units - for blood sugar 25 points over 150 take 2 additional units       . insulin glargine (LANTUS SOLOSTAR) 100 UNIT/ML injection Inject 50 Units into the skin at bedtime.       [name] lisinopril (PRINIVIL,ZESTRIL) 20 MG tablet Take 20 mg by mouth daily.        . metFORMIN (GLUCOPHAGE) 1000 MG tablet Take 1,000 mg by mouth 2 (two) times daily with a meal.        . omega-3 acid ethyl esters (LOVAZA) 1 G capsule Take 4 g by mouth daily.        [name] omeprazole  (PRILOSEC) 20 MG capsule Take 20 mg by mouth daily.        . sertraline (ZOLOFT) 100 MG tablet Take 1 tablet (100 mg total) by mouth daily.  90 tablet  3     Orders Placed This Encounter  Procedures  . LDL cholesterol, direct  . Lipid panel    No Follow-up on file.

## 2012-06-30 NOTE — Assessment & Plan Note (Signed)
well controlled  

## 2012-06-30 NOTE — Assessment & Plan Note (Signed)
Lipids were retested today and his manual triglycerides are 10,160.  He is taking gemfibrozil and has been instructed to follow a fat free diet by Dr. Eligah East. He remains at high risk for recurrent pancreatitis.

## 2012-08-17 ENCOUNTER — Telehealth: Payer: Self-pay

## 2012-08-17 ENCOUNTER — Ambulatory Visit (INDEPENDENT_AMBULATORY_CARE_PROVIDER_SITE_OTHER): Payer: No Typology Code available for payment source | Admitting: Psychology

## 2012-08-17 DIAGNOSIS — F331 Major depressive disorder, recurrent, moderate: Secondary | ICD-10-CM

## 2012-08-17 NOTE — Telephone Encounter (Signed)
Patient stated that he went to [facility] and saw the physcian that he was referred to see and she wants you to call her to discuss some medication changes for patient.

## 2012-08-21 ENCOUNTER — Inpatient Hospital Stay: Payer: Self-pay | Admitting: Internal Medicine

## 2012-08-21 LAB — CBC
HCT: 38.2 % — ABNORMAL LOW (ref 40.0–52.0)
MCV: 76 fL — ABNORMAL LOW (ref 80–100)
Platelet: 296 10*3/uL (ref 150–440)
RBC: 5.03 10*6/uL (ref 4.40–5.90)
RDW: 16.9 % — ABNORMAL HIGH (ref 11.5–14.5)
WBC: 7.8 10*3/uL (ref 3.8–10.6)

## 2012-08-21 LAB — LIPASE, BLOOD: Lipase: 451 U/L — ABNORMAL HIGH (ref 73–393)

## 2012-08-21 LAB — COMPREHENSIVE METABOLIC PANEL
Alkaline Phosphatase: 68 U/L (ref 50–136)
Anion Gap: 8 (ref 7–16)
BUN: 11 mg/dL (ref 7–18)
Bilirubin,Total: 0.3 mg/dL (ref 0.2–1.0)
Co2: 25 mmol/L (ref 21–32)
Creatinine: 0.78 mg/dL (ref 0.60–1.30)
EGFR (African American): >60
EGFR (Non-African Amer.): >60
Glucose: 270 mg/dL — ABNORMAL HIGH (ref 65–99)
SGOT(AST): 20 U/L (ref 15–37)
SGPT (ALT): 28 U/L (ref 12–78)
Sodium: 133 mmol/L — ABNORMAL LOW (ref 136–145)

## 2012-08-21 LAB — URINALYSIS, COMPLETE
Bilirubin,UR: NEGATIVE
Blood: NEGATIVE
Glucose,UR: >=500 mg/dL (ref 0–75)
Leukocyte Esterase: NEGATIVE
Nitrite: NEGATIVE
RBC,UR: <1 /HPF (ref 0–5)
Specific Gravity: 1.027 (ref 1.003–1.030)
Squamous Epithelial: <1
WBC UR: 1 /HPF (ref 0–5)

## 2012-08-21 LAB — LIPID PANEL
HDL Cholesterol: 20 mg/dL — ABNORMAL LOW (ref 40–60)
Triglycerides: 710 mg/dL — ABNORMAL HIGH (ref 0–200)

## 2012-08-21 LAB — LACTATE DEHYDROGENASE: LDH: 123 U/L (ref 85–241)

## 2012-08-21 NOTE — Telephone Encounter (Signed)
Behavioral phyiscian name is Evalina Field at the [facility] office, [phone]

## 2012-08-21 NOTE — Telephone Encounter (Signed)
Please get me her phone number so I can call after hours,  or have her fax me her comments.

## 2012-08-22 LAB — BASIC METABOLIC PANEL
Anion Gap: 12 (ref 7–16)
Co2: 22 mmol/L (ref 21–32)
EGFR (African American): >60
EGFR (Non-African Amer.): >60
Osmolality: 277 (ref 275–301)
Sodium: 136 mmol/L (ref 136–145)

## 2012-08-22 LAB — CBC WITH DIFFERENTIAL/PLATELET
Basophil #: 0 10*3/uL (ref 0.0–0.1)
Basophil %: 0.4 %
Eosinophil #: 0.1 10*3/uL (ref 0.0–0.7)
Eosinophil %: 1.2 %
HCT: 33.8 % — ABNORMAL LOW (ref 40.0–52.0)
HGB: 12.3 g/dL — ABNORMAL LOW (ref 13.0–18.0)
Lymphocyte #: 2.1 10*3/uL (ref 1.0–3.6)
Lymphocyte %: 24.7 %
MCH: 28.1 pg (ref 26.0–34.0)
MCHC: 36.6 g/dL — ABNORMAL HIGH (ref 32.0–36.0)
MCV: 77 fL — ABNORMAL LOW (ref 80–100)
Monocyte #: 0.4 x10 3/mm (ref 0.2–1.0)
Monocyte %: 5.1 %
Neutrophil #: 5.9 10*3/uL (ref 1.4–6.5)
Neutrophil %: 68.6 %
Platelet: 304 10*3/uL (ref 150–440)
RBC: 4.39 10*6/uL — ABNORMAL LOW (ref 4.40–5.90)
RDW: 17.1 % — ABNORMAL HIGH (ref 11.5–14.5)
WBC: 9.6 10*3/uL (ref 3.8–10.6)

## 2012-08-22 LAB — LIPASE, BLOOD: Lipase: 200 U/L (ref 73–393)

## 2012-08-23 LAB — CALCIUM: Calcium, Total: 8.6 mg/dL (ref 8.5–10.1)

## 2012-08-24 LAB — BASIC METABOLIC PANEL
Anion Gap: 10 (ref 7–16)
Calcium, Total: 8.2 mg/dL — ABNORMAL LOW (ref 8.5–10.1)
Creatinine: 0.45 mg/dL — ABNORMAL LOW (ref 0.60–1.30)
EGFR (African American): >60
Glucose: 113 mg/dL — ABNORMAL HIGH (ref 65–99)
Potassium: 3.9 mmol/L (ref 3.5–5.1)

## 2012-08-25 LAB — COMPREHENSIVE METABOLIC PANEL
Anion Gap: 9 (ref 7–16)
Bilirubin,Total: 0.4 mg/dL (ref 0.2–1.0)
Calcium, Total: 8.2 mg/dL — ABNORMAL LOW (ref 8.5–10.1)
Chloride: 103 mmol/L (ref 98–107)
Co2: 24 mmol/L (ref 21–32)
Creatinine: 0.69 mg/dL (ref 0.60–1.30)
EGFR (African American): >60
EGFR (Non-African Amer.): >60
Glucose: 108 mg/dL — ABNORMAL HIGH (ref 65–99)
Osmolality: 271 (ref 275–301)
Potassium: 3.9 mmol/L (ref 3.5–5.1)
Sodium: 136 mmol/L (ref 136–145)
Total Protein: 7.3 g/dL (ref 6.4–8.2)

## 2012-08-25 LAB — TRIGLYCERIDES: Triglycerides: 1495 mg/dL — ABNORMAL HIGH (ref 0–200)

## 2012-08-27 LAB — TRIGLYCERIDES: Triglycerides: 961 mg/dL — ABNORMAL HIGH (ref 0–200)

## 2012-08-28 LAB — TRIGLYCERIDES: Triglycerides: 813 mg/dL — ABNORMAL HIGH (ref 0–200)

## 2012-09-02 ENCOUNTER — Ambulatory Visit (INDEPENDENT_AMBULATORY_CARE_PROVIDER_SITE_OTHER): Payer: No Typology Code available for payment source | Admitting: Psychology

## 2012-09-02 DIAGNOSIS — F331 Major depressive disorder, recurrent, moderate: Secondary | ICD-10-CM

## 2012-09-13 ENCOUNTER — Ambulatory Visit (INDEPENDENT_AMBULATORY_CARE_PROVIDER_SITE_OTHER): Payer: No Typology Code available for payment source | Admitting: Psychology

## 2012-09-13 DIAGNOSIS — F331 Major depressive disorder, recurrent, moderate: Secondary | ICD-10-CM

## 2012-09-18 ENCOUNTER — Ambulatory Visit: Payer: PRIVATE HEALTH INSURANCE | Admitting: Internal Medicine

## 2012-09-20 ENCOUNTER — Ambulatory Visit (INDEPENDENT_AMBULATORY_CARE_PROVIDER_SITE_OTHER): Payer: No Typology Code available for payment source | Admitting: Psychology

## 2012-09-20 DIAGNOSIS — F331 Major depressive disorder, recurrent, moderate: Secondary | ICD-10-CM

## 2012-09-21 ENCOUNTER — Ambulatory Visit: Payer: Self-pay | Admitting: Psychology

## 2012-09-28 ENCOUNTER — Ambulatory Visit: Payer: No Typology Code available for payment source | Admitting: Psychology

## 2012-10-03 ENCOUNTER — Ambulatory Visit: Payer: No Typology Code available for payment source | Admitting: Psychology

## 2012-10-12 ENCOUNTER — Ambulatory Visit (INDEPENDENT_AMBULATORY_CARE_PROVIDER_SITE_OTHER): Payer: No Typology Code available for payment source | Admitting: Psychology

## 2012-10-12 DIAGNOSIS — F331 Major depressive disorder, recurrent, moderate: Secondary | ICD-10-CM

## 2012-11-08 ENCOUNTER — Ambulatory Visit: Payer: No Typology Code available for payment source | Admitting: Psychology

## 2012-11-15 ENCOUNTER — Ambulatory Visit (INDEPENDENT_AMBULATORY_CARE_PROVIDER_SITE_OTHER): Payer: No Typology Code available for payment source | Admitting: Psychology

## 2012-11-15 DIAGNOSIS — F331 Major depressive disorder, recurrent, moderate: Secondary | ICD-10-CM

## 2012-11-22 ENCOUNTER — Ambulatory Visit: Payer: No Typology Code available for payment source | Admitting: Psychology

## 2012-11-26 ENCOUNTER — Other Ambulatory Visit: Payer: Self-pay

## 2012-11-29 ENCOUNTER — Ambulatory Visit (INDEPENDENT_AMBULATORY_CARE_PROVIDER_SITE_OTHER): Payer: No Typology Code available for payment source | Admitting: Psychology

## 2012-11-29 DIAGNOSIS — F509 Eating disorder, unspecified: Secondary | ICD-10-CM

## 2012-12-06 ENCOUNTER — Ambulatory Visit (INDEPENDENT_AMBULATORY_CARE_PROVIDER_SITE_OTHER): Payer: No Typology Code available for payment source | Admitting: Psychology

## 2012-12-06 DIAGNOSIS — F509 Eating disorder, unspecified: Secondary | ICD-10-CM

## 2012-12-12 ENCOUNTER — Encounter: Payer: Self-pay | Admitting: Internal Medicine

## 2012-12-12 ENCOUNTER — Ambulatory Visit: Payer: Self-pay | Admitting: Internal Medicine

## 2012-12-12 ENCOUNTER — Ambulatory Visit (INDEPENDENT_AMBULATORY_CARE_PROVIDER_SITE_OTHER): Payer: PRIVATE HEALTH INSURANCE | Admitting: Internal Medicine

## 2012-12-12 VITALS — BP 140/102 | HR 88 | Temp 98.2°F | Wt 259.0 lb

## 2012-12-12 DIAGNOSIS — E1165 Type 2 diabetes mellitus with hyperglycemia: Secondary | ICD-10-CM

## 2012-12-12 DIAGNOSIS — IMO0002 Reserved for concepts with insufficient information to code with codable children: Secondary | ICD-10-CM

## 2012-12-12 DIAGNOSIS — IMO0001 Reserved for inherently not codable concepts without codable children: Secondary | ICD-10-CM

## 2012-12-12 DIAGNOSIS — E781 Pure hyperglyceridemia: Secondary | ICD-10-CM

## 2012-12-12 MED ORDER — INSULIN ASPART PROT & ASPART (70-30 MIX) 100 UNIT/ML ~~LOC~~ SUSP: SUBCUTANEOUS | Status: DC

## 2012-12-12 MED ORDER — GABAPENTIN 300 MG PO CAPS: 300.0000 mg | ORAL_CAPSULE | Freq: Three times a day (TID) | ORAL | Status: DC

## 2012-12-12 NOTE — Patient Instructions (Addendum)
We are starting gabapentin (generic Neurontin)  300 mg at bedtime to manage the neuropathy  continue lantus  Start 70/30 insulin for twice daily use:  Start with  20 units before breakfast and 30 before dinner, instead of the short acting insulin   We will adjust your doses weekly based on your blood sugars   Please get fasting labs done this week and fax to me at [phone]

## 2012-12-12 NOTE — Assessment & Plan Note (Signed)
With medication noncompiance resulting in recurrent pancreatitis.  Return for fasting lipids,  tricor changed to gemfribrozil

## 2012-12-12 NOTE — Progress Notes (Signed)
Patient ID: Sergio [name], male   DOB: 1976-08-30, 37 y.o.   MRN: [id number]  Patient Active Problem List  Diagnosis  . Sleep apnea  . DM (diabetes mellitus), type 2, uncontrolled w/neurologic complication  . Hypertriglyceridemia  . HTN (hypertension)  . Generalized anxiety disorder  . Obesity (BMI 30-39.9)  . Diarrhea  . History of pancreatitis    Subjective:  CC:   Chief Complaint  Patient presents with  . Pain    Pain in his hands and feet, interferring with him sleeping at night    HPI:   Sergio [name] a 37 y.o. male who presents with bilateral foot pain burning   For the past month. Has been moving up the leg.  Has some numbness due to  Back issues.  Has been noncompliant with tid insulin and also with her insulin and cholesterol medications  His sugars have been averaging around 350.    Past Medical History  Diagnosis Date  . Chronic pancreatitis May 2009    secondary to hypertirglyceridemia   . Gastric ulcer 2009    by endo  . Sleep apnea 1999    uses CPAP, auto titrates,  [name]  . Chicken pox   . Diabetes mellitus   . HTN (hypertension)   . Hypertriglyceridemia     severe    Past Surgical History  Procedure Laterality Date  . Tonsillectomy  2003  . Vasectomy    . Lumbar disc surgery  2008       The following portions of the patient's history were reviewed and updated as appropriate: Allergies, current medications, and problem list.    Review of Systems:    Patient denies headache, fevers, malaise, unintentional weight loss, skin rash, eye pain, sinus congestion and sinus pain, sore throat, dysphagia,  hemoptysis , cough, dyspnea, wheezing, chest pain, palpitations, orthopnea, edema, abdominal pain, nausea, melena, diarrhea, constipation, flank pain, dysuria, hematuria, urinary  Frequency, nocturia, numbness, tingling, seizures,  Focal weakness, Loss of consciousness,  Tremor, insomnia, depression, anxiety, and suicidal ideation.      History    Social History  . Marital Status: Married    Spouse Name: N/A    Number of Children: 3  . Years of Education: N/A   Occupational History  . Surveyor, minerals    Social History Main Topics  . Smoking status: Never Smoker   . Smokeless tobacco: Never Used  . Alcohol Use: Yes     Comment: once month  . Drug Use: No  . Sexually Active: Not on file   Other Topics Concern  . Not on file   Social History Narrative   Daily Caffeine Use:  2-3 week/Soda   Regular Exercise -  NO    Objective:  BP 140/102  Pulse 88  Temp(Src) 98.2 F (36.8 C) (Oral)  Wt 259 lb (117.482 kg)  BMI 37.16 kg/m2  SpO2 95%  General appearance: alert, cooperative and appears stated age Ears: normal TM's and external ear canals both ears Throat: lips, mucosa, and tongue normal; teeth and gums normal Neck: no adenopathy, no carotid bruit, supple, symmetrical, trachea midline and thyroid not enlarged, symmetric, no tenderness/mass/nodules Back: symmetric, no curvature. ROM normal. No CVA tenderness. Lungs: clear to auscultation bilaterally Heart: regular rate and rhythm, S1, S2 normal, no murmur, click, rub or gallop Abdomen: soft, non-tender; bowel sounds normal; no masses,  no organomegaly Pulses: 2+ and symmetric Skin: Skin color, texture, turgor normal. No rashes or lesions  Lymph nodes: Cervical, supraclavicular, and axillary nodes normal. Foot exam:  Nails are well trimmed,  No callouses,  Sensation is intact in 5 of 10 locations bilaterally    Assessment and Plan:  Hypertriglyceridemia With medication noncompiance resulting in recurrent pancreatitis.  Return for fasting lipids,  tricor changed to gemfribrozil   DM (diabetes mellitus), type 2, uncontrolled w/neurologic complication Changing tid lispro to 70/30 bid 20 and 30 units/  Continue Lantus. Adjust insulin via mychart weekly. neurontin for new onset neuropathy   Updated  Medication List Outpatient Encounter Prescriptions as of 12/12/2012  Medication Sig Dispense Refill  . ALPRAZolam (XANAX) 0.5 MG tablet Take 1 tablet (0.5 mg total) by mouth at bedtime as needed.  30 tablet  5  . aspirin EC 81 MG tablet Take 81 mg by mouth daily.        . carvedilol (COREG) 6.25 MG tablet Take 6.25 mg by mouth 2 (two) times daily with a meal.        . gemfibrozil (LOPID) 600 MG tablet Take 600 mg by mouth 2 (two) times daily before a meal.      . insulin aspart (NOVOLOG) 100 UNIT/ML injection Inject into the skin 3 (three) times daily before meals. 12 units - for blood sugar 25 points over 150 take 2 additional units       . insulin glargine (LANTUS SOLOSTAR) 100 UNIT/ML injection Inject 50 Units into the skin at bedtime.       [name] lisinopril (PRINIVIL,ZESTRIL) 20 MG tablet Take 20 mg by mouth daily.        . metFORMIN (GLUCOPHAGE) 1000 MG tablet Take 1,000 mg by mouth 2 (two) times daily with a meal.        . omega-3 acid ethyl esters (LOVAZA) 1 G capsule Take 4 g by mouth daily.        [name] omeprazole (PRILOSEC) 20 MG capsule Take 20 mg by mouth daily.        . sertraline (ZOLOFT) 100 MG tablet Take 1 tablet (100 mg total) by mouth daily.  90 tablet  3  . gabapentin (NEURONTIN) 300 MG capsule Take 1 capsule (300 mg total) by mouth 3 (three) times daily.  90 capsule  3  . insulin aspart protamine-insulin aspart (NOVOLOG MIX 70/30 FLEXPEN) (70-30) 100 UNIT/ML injection 20 units before breakfast ,  30 units before supper  15 mL  12   No facility-administered encounter medications on file as of 12/12/2012.     No orders of the defined types were placed in this encounter.    No Follow-up on file.

## 2012-12-12 NOTE — Assessment & Plan Note (Addendum)
Changing tid lispro to 70/30 bid 20 and 30 units/  Continue Lantus. Adjust insulin via mychart weekly. neurontin for new onset neuropathy

## 2012-12-14 ENCOUNTER — Encounter: Payer: Self-pay | Admitting: Internal Medicine

## 2013-01-22 ENCOUNTER — Other Ambulatory Visit: Payer: Self-pay | Admitting: *Deleted

## 2013-01-23 MED ORDER — ALPRAZOLAM 0.5 MG PO TABS: 0.5000 mg | ORAL_TABLET | Freq: Every evening | ORAL | Status: DC | PRN

## 2013-01-23 NOTE — Telephone Encounter (Signed)
Ok to refill,  Authorized in epic 

## 2013-01-24 NOTE — Telephone Encounter (Signed)
Refill faxed to pharmacy on 01/23/13

## 2013-02-14 LAB — CBC
HCT: 34.3 % — ABNORMAL LOW (ref 40.0–52.0)
MCV: 75 fL — ABNORMAL LOW (ref 80–100)
RBC: 4.58 10*6/uL (ref 4.40–5.90)

## 2013-02-14 LAB — COMPREHENSIVE METABOLIC PANEL
Albumin: 3.9 g/dL (ref 3.4–5.0)
Alkaline Phosphatase: 54 U/L (ref 50–136)
Anion Gap: 10 (ref 7–16)
BUN: 11 mg/dL (ref 7–18)
Bilirubin,Total: 0.3 mg/dL (ref 0.2–1.0)
Calcium, Total: 8.9 mg/dL (ref 8.5–10.1)
Chloride: 101 mmol/L (ref 98–107)
Co2: 25 mmol/L (ref 21–32)
EGFR (African American): >60
EGFR (Non-African Amer.): >60
Glucose: 279 mg/dL — ABNORMAL HIGH (ref 65–99)
Osmolality: 281 (ref 275–301)
SGOT(AST): 24 U/L (ref 15–37)
Total Protein: 8 g/dL (ref 6.4–8.2)

## 2013-02-14 LAB — LIPASE, BLOOD: Lipase: >3000 U/L (ref 73–393)

## 2013-02-15 ENCOUNTER — Inpatient Hospital Stay: Payer: Self-pay | Admitting: Student

## 2013-02-15 LAB — BASIC METABOLIC PANEL
BUN: 8 mg/dL (ref 7–18)
Calcium, Total: 6.4 mg/dL — CL (ref 8.5–10.1)
Co2: 25 mmol/L (ref 21–32)
EGFR (African American): >60
EGFR (Non-African Amer.): >60
Glucose: 179 mg/dL — ABNORMAL HIGH (ref 65–99)
Sodium: 136 mmol/L (ref 136–145)

## 2013-02-15 LAB — LIPID PANEL: Cholesterol: 409 mg/dL — ABNORMAL HIGH (ref 0–200)

## 2013-02-15 LAB — HEMOGLOBIN: HGB: 14.7 g/dL (ref 13.0–18.0)

## 2013-02-16 LAB — CBC WITH DIFFERENTIAL/PLATELET
Basophil %: 0.4 %
Eosinophil #: 0 10*3/uL (ref 0.0–0.7)
HCT: 36.1 % — ABNORMAL LOW (ref 40.0–52.0)
Lymphocyte #: 1.3 10*3/uL (ref 1.0–3.6)
MCH: 26.9 pg (ref 26.0–34.0)
MCHC: 35 g/dL (ref 32.0–36.0)
Monocyte %: 5 %
Neutrophil %: 78.3 %
Platelet: 232 10*3/uL (ref 150–440)
RDW: 17 % — ABNORMAL HIGH (ref 11.5–14.5)
WBC: 8.3 10*3/uL (ref 3.8–10.6)

## 2013-02-16 LAB — BASIC METABOLIC PANEL
BUN: 7 mg/dL (ref 7–18)
Calcium, Total: 6.9 mg/dL — CL (ref 8.5–10.1)
Co2: 24 mmol/L (ref 21–32)
Creatinine: 0.83 mg/dL (ref 0.60–1.30)
EGFR (African American): >60
EGFR (Non-African Amer.): >60
EGFR (Non-African Amer.): >60
Glucose: 209 mg/dL — ABNORMAL HIGH (ref 65–99)
Potassium: 3.6 mmol/L (ref 3.5–5.1)
Potassium: 3.5 mmol/L (ref 3.5–5.1)
Sodium: 136 mmol/L (ref 136–145)
Sodium: 133 mmol/L — ABNORMAL LOW (ref 136–145)

## 2013-02-16 LAB — URINALYSIS, COMPLETE
Bacteria: NONE SEEN
Blood: NEGATIVE
Glucose,UR: >=500 mg/dL (ref 0–75)
Nitrite: NEGATIVE
WBC UR: 4 /HPF (ref 0–5)

## 2013-02-17 LAB — CBC WITH DIFFERENTIAL/PLATELET
Basophil #: 0.1 10*3/uL (ref 0.0–0.1)
Basophil %: 1.1 %
Eosinophil %: 0 %
HCT: 33.6 % — ABNORMAL LOW (ref 40.0–52.0)
Lymphocyte #: 0.5 10*3/uL — ABNORMAL LOW (ref 1.0–3.6)
Lymphocyte %: 6.4 %
MCH: 28.2 pg (ref 26.0–34.0)
MCHC: 36.5 g/dL — ABNORMAL HIGH (ref 32.0–36.0)
Monocyte #: 0.6 x10 3/mm (ref 0.2–1.0)
Neutrophil #: 6.6 10*3/uL — ABNORMAL HIGH (ref 1.4–6.5)
Neutrophil %: 84.3 %
RBC: 4.35 10*6/uL — ABNORMAL LOW (ref 4.40–5.90)
WBC: 7.8 10*3/uL (ref 3.8–10.6)

## 2013-02-17 LAB — HEPATIC FUNCTION PANEL A (ARMC)
Bilirubin, Direct: <0.05 mg/dL (ref 0.00–0.20)
Bilirubin,Total: 0.6 mg/dL (ref 0.2–1.0)
SGOT(AST): 49 U/L — ABNORMAL HIGH (ref 15–37)
Total Protein: 6.5 g/dL (ref 6.4–8.2)

## 2013-02-17 LAB — LIPASE, BLOOD: Lipase: 144 U/L (ref 73–393)

## 2013-02-17 LAB — ALBUMIN: Albumin: 2.1 g/dL — ABNORMAL LOW (ref 3.4–5.0)

## 2013-02-17 LAB — BASIC METABOLIC PANEL
Anion Gap: 7 (ref 7–16)
Calcium, Total: 6.7 mg/dL — CL (ref 8.5–10.1)
Chloride: 97 mmol/L — ABNORMAL LOW (ref 98–107)
Co2: 25 mmol/L (ref 21–32)
EGFR (African American): >60
Osmolality: 260 (ref 275–301)

## 2013-02-17 LAB — TRIGLYCERIDES: Triglycerides: 2417 mg/dL — ABNORMAL HIGH (ref 0–200)

## 2013-02-18 LAB — BASIC METABOLIC PANEL
Anion Gap: 8 (ref 7–16)
Calcium, Total: 8 mg/dL — ABNORMAL LOW (ref 8.5–10.1)
Chloride: 96 mmol/L — ABNORMAL LOW (ref 98–107)
Co2: 28 mmol/L (ref 21–32)
EGFR (African American): >60
EGFR (Non-African Amer.): >60
Osmolality: 264 (ref 275–301)
Sodium: 132 mmol/L — ABNORMAL LOW (ref 136–145)

## 2013-02-19 LAB — BASIC METABOLIC PANEL
Anion Gap: 8 (ref 7–16)
BUN: 8 mg/dL (ref 7–18)
Calcium, Total: 8.6 mg/dL (ref 8.5–10.1)
Chloride: 100 mmol/L (ref 98–107)
Co2: 28 mmol/L (ref 21–32)
Creatinine: 0.69 mg/dL (ref 0.60–1.30)
EGFR (African American): >60
EGFR (Non-African Amer.): >60
Potassium: 2.9 mmol/L — ABNORMAL LOW (ref 3.5–5.1)
Sodium: 136 mmol/L (ref 136–145)

## 2013-02-20 ENCOUNTER — Telehealth: Payer: Self-pay | Admitting: Internal Medicine

## 2013-02-20 LAB — TRIGLYCERIDES: Triglycerides: 812 mg/dL — ABNORMAL HIGH (ref 0–200)

## 2013-02-20 NOTE — Telephone Encounter (Signed)
hosptial follow up discharged 02/20/13/[facility] nurse stated she will fax notes 5/13/[facility]  Appointment 02/27/13 with Raquel

## 2013-02-21 NOTE — Telephone Encounter (Signed)
Received discharge notes will give to Orville Govern NP for visit scheduled 02/27/13 .

## 2013-02-27 ENCOUNTER — Ambulatory Visit: Payer: Self-pay | Admitting: Adult Health

## 2013-03-01 ENCOUNTER — Telehealth: Payer: Self-pay | Admitting: *Deleted

## 2013-03-01 NOTE — Telephone Encounter (Signed)
Spoke with pt, states doing well with no complaints. Has been following up with Dr. Tedd Sias and Dr. Markham Jordan, which is why his appointment here was rescheduled to 03/19/13. Advised to call back with any concerns or problems.

## 2013-03-01 NOTE — Telephone Encounter (Signed)
Called and left message for pt, following up on hospital visit, discharged 02/20/13. Requested a call back to see how pt has been since discharge. Has a followup scheduled 03/19/13.

## 2013-03-19 ENCOUNTER — Ambulatory Visit (INDEPENDENT_AMBULATORY_CARE_PROVIDER_SITE_OTHER): Payer: PRIVATE HEALTH INSURANCE | Admitting: Internal Medicine

## 2013-03-19 ENCOUNTER — Encounter: Payer: Self-pay | Admitting: Internal Medicine

## 2013-03-19 VITALS — BP 124/88 | HR 77 | Temp 98.5°F | Resp 14 | Wt 244.8 lb

## 2013-03-19 DIAGNOSIS — I10 Essential (primary) hypertension: Secondary | ICD-10-CM

## 2013-03-19 DIAGNOSIS — IMO0002 Reserved for concepts with insufficient information to code with codable children: Secondary | ICD-10-CM

## 2013-03-19 DIAGNOSIS — E781 Pure hyperglyceridemia: Secondary | ICD-10-CM

## 2013-03-19 DIAGNOSIS — E1149 Type 2 diabetes mellitus with other diabetic neurological complication: Secondary | ICD-10-CM

## 2013-03-19 DIAGNOSIS — E1142 Type 2 diabetes mellitus with diabetic polyneuropathy: Secondary | ICD-10-CM

## 2013-03-19 DIAGNOSIS — E1165 Type 2 diabetes mellitus with hyperglycemia: Secondary | ICD-10-CM

## 2013-03-19 DIAGNOSIS — G4730 Sleep apnea, unspecified: Secondary | ICD-10-CM

## 2013-03-19 NOTE — Progress Notes (Signed)
Patient ID: Sergio [name], male   DOB: 1976-05-20, 37 y.o.   MRN: [id number]  Patient Active Problem List   Diagnosis Date Noted  . Diarrhea 05/29/2012  . History of pancreatitis 05/29/2012  . Generalized anxiety disorder 04/29/2012  . Obesity (BMI 30-39.9) 04/29/2012  . DM (diabetes mellitus), type 2, uncontrolled w/neurologic complication   . Hypertriglyceridemia   . HTN (hypertension)   . Sleep apnea     Subjective:  CC:   Chief Complaint  Patient presents with  . Follow-up    HPI:   Sergio [name] a 37 y.o. male who presentsHospital discharge for pancreatitis secondary to hypertriglyceridemia and HONK.  Wife was less supportive/enabling and it was a WAKE UP call.  Was seen by Endocrine and psychiatry (Dr Guss Bunde) .  Had several panic attacks during his stay  Because his wife [name] did not stay with him overnight, once when his CPAP tubing got coiled around his neck. And once when the 02 had to be taken off to gewt to the bathroom.  Had some respiratory depression from either the dilaudid or the IV fluids requiring 02 supplementation Has been in a 12 step program.  Church based program for addiction  .  Meets weekly with sponsor,  Saturday mornign breakfast meeting with guys,  Seeing a psychologist at [facility]. Prefers the [employer] to Evalina Field,  bc she is more confrontational .  Seeing Dr.  Tedd Sias for management of uncontrolled diabetes with plans for an insulin pump   Past Medical History  Diagnosis Date  . Chronic pancreatitis May 2009    secondary to hypertirglyceridemia   . Gastric ulcer 2009    by endo  . Sleep apnea 1999    uses CPAP, auto titrates,  [name]  . Chicken pox   . Diabetes mellitus   . HTN (hypertension)   . Hypertriglyceridemia     severe    Past Surgical History  Procedure Laterality Date  . Tonsillectomy  2003  . Vasectomy    . Lumbar disc surgery  2008    The following portions of the patient's history were reviewed and updated as  appropriate: Allergies, current medications, and problem list.    Review of Systems:  Patient denies headache, fevers, malaise, unintentional weight loss, skin rash, eye pain, sinus congestion and sinus pain, sore throat, dysphagia,  hemoptysis , cough, dyspnea, wheezing, chest pain, palpitations, orthopnea, edema, abdominal pain, nausea, melena, diarrhea, constipation, flank pain, dysuria, hematuria, urinary  Frequency, nocturia, numbness, tingling, seizures,  Focal weakness, Loss of consciousness,  Tremor, insomnia, depression, anxiety, and suicidal ideation.     History   Social History  . Marital Status: Married    Spouse Name: N/A    Number of Children: 3  . Years of Education: N/A   Occupational History  . Surveyor, minerals    Social History Main Topics  . Smoking status: Never Smoker   . Smokeless tobacco: Never Used  . Alcohol Use: Yes     Comment: once month  . Drug Use: No  . Sexually Active: Not on file   Other Topics Concern  . Not on file   Social History Narrative   Daily Caffeine Use:  2-3 week/Soda   Regular Exercise -  NO    Objective:  BP 124/88  Pulse 77  Temp(Src) 98.5 F (36.9 C) (Oral)  Resp 14  Wt 244 lb 12 oz (111.018 kg)  BMI 35.12 kg/m2  SpO2 98%  General appearance: alert, cooperative and appears stated age Ears: normal TM's and external ear canals both ears Throat: lips, mucosa, and tongue normal; teeth and gums normal Neck: no adenopathy, no carotid bruit, supple, symmetrical, trachea midline and thyroid not enlarged, symmetric, no tenderness/mass/nodules Back: symmetric, no curvature. ROM normal. No CVA tenderness. Lungs: clear to auscultation bilaterally Heart: regular rate and rhythm, S1, S2 normal, no murmur, click, rub or gallop Abdomen: soft, non-tender; bowel sounds normal; no masses,  no organomegaly Pulses: 2+ and symmetric Skin: Skin color, texture, turgor normal. No  rashes or lesions Lymph nodes: Cervical, supraclavicular, and axillary nodes normal.  Assessment and Plan:  DM (diabetes mellitus), type 2, uncontrolled w/neurologic complication Using only 60  Units of lantus basal and  25 units of novolog tid with prn  sliding scale ,  .  Blood sugars are now 150 in the morning,  awating ump.  Wt is stable   Hypertriglyceridemia Familial, with recurrent admission for pancreatitis secondary to noncompliance with medications and food addictions. .  Patient is now atteding addiction ssupport grousp and psychotherapy after wife treated him with "tough love" during recent hospitalizations.   Sleep apnea Managed with CPAP.  He had an episode of terror during hospitalization due to coiling of tubing around his neck.   HTN (hypertension) Well controlled on current regimen. Renal function stable, no changes today.  A total of 40 minutes was spent with patient more than half of which was spent in counseling, reviewing records from other prviders and coordination of care.   Updated Medication List Outpatient Encounter Prescriptions as of 03/19/2013  Medication Sig Dispense Refill  . ALPRAZolam (XANAX) 0.5 MG tablet Take 1 tablet (0.5 mg total) by mouth at bedtime as needed.  30 tablet  5  . aspirin EC 81 MG tablet Take 81 mg by mouth daily.        . carvedilol (COREG) 6.25 MG tablet Take 6.25 mg by mouth 2 (two) times daily with a meal.        . gabapentin (NEURONTIN) 300 MG capsule Take 1 capsule (300 mg total) by mouth 3 (three) times daily.  90 capsule  3  . gemfibrozil (LOPID) 600 MG tablet Take 600 mg by mouth 2 (two) times daily before a meal.      . insulin aspart (NOVOLOG) 100 UNIT/ML injection Inject into the skin 3 (three) times daily before meals. 12 units - for blood sugar 25 points over 150 take 2 additional units       . insulin glargine (LANTUS SOLOSTAR) 100 UNIT/ML injection Inject 50 Units into the skin at bedtime.       [name] lisinopril  (PRINIVIL,ZESTRIL) 20 MG tablet Take 20 mg by mouth daily.        . metFORMIN (GLUCOPHAGE) 1000 MG tablet Take 1,000 mg by mouth 2 (two) times daily with a meal.        . omega-3 acid ethyl esters (LOVAZA) 1 G capsule Take 4 g by mouth daily.        [name] omeprazole (PRILOSEC) 20 MG capsule Take 20 mg by mouth daily.        . sertraline (ZOLOFT) 100 MG tablet Take 1 tablet (100 mg total) by mouth daily.  90 tablet  3  . insulin aspart protamine-insulin aspart (NOVOLOG MIX 70/30 FLEXPEN) (70-30) 100 UNIT/ML injection 20 units before breakfast ,  30 units before supper  15 mL  12   No facility-administered encounter medications on file as of 03/19/2013.  No orders of the defined types were placed in this encounter.    No Follow-up on file.

## 2013-03-19 NOTE — Assessment & Plan Note (Signed)
Well controlled on current regimen. Renal function stable, no changes today. 

## 2013-03-19 NOTE — Assessment & Plan Note (Signed)
Managed with CPAP.  He had an episode of terror during hospitalization due to coiling of tubing around his neck.

## 2013-03-19 NOTE — Assessment & Plan Note (Signed)
Using only 60  Units of lantus basal and  25 units of novolog tid with prn  sliding scale ,  .  Blood sugars are now 150 in the morning,  awating ump.  Wt is stable

## 2013-03-19 NOTE — Assessment & Plan Note (Signed)
Familial, with recurrent admission for pancreatitis secondary to noncompliance with medications and food addictions. .  Patient is now atteding addiction ssupport grousp and psychotherapy after wife treated him with "tough love" during recent hospitalizations.

## 2013-07-06 ENCOUNTER — Other Ambulatory Visit: Payer: Self-pay | Admitting: Internal Medicine

## 2013-08-10 DIAGNOSIS — Z0279 Encounter for issue of other medical certificate: Secondary | ICD-10-CM

## 2013-08-13 ENCOUNTER — Telehealth: Payer: Self-pay | Admitting: Internal Medicine

## 2013-08-13 NOTE — Telephone Encounter (Signed)
Patient notified form s ready as requested placed up front for pick up

## 2013-08-13 NOTE — Telephone Encounter (Signed)
The patient is wanting to know if his forms are ready for the [facility]

## 2013-08-16 ENCOUNTER — Other Ambulatory Visit: Payer: Self-pay

## 2013-08-27 ENCOUNTER — Inpatient Hospital Stay: Payer: Self-pay | Admitting: Internal Medicine

## 2013-08-27 LAB — COMPREHENSIVE METABOLIC PANEL
Calcium, Total: 8.6 mg/dL (ref 8.5–10.1)
Chloride: 102 mmol/L (ref 98–107)
EGFR (African American): >60
EGFR (Non-African Amer.): >60
Osmolality: 278 (ref 275–301)
Potassium: 3.5 mmol/L (ref 3.5–5.1)
SGOT(AST): 26 U/L (ref 15–37)
SGPT (ALT): 32 U/L (ref 12–78)
Sodium: 134 mmol/L — ABNORMAL LOW (ref 136–145)
Total Protein: 7.9 g/dL (ref 6.4–8.2)

## 2013-08-27 LAB — URINALYSIS, COMPLETE
Bacteria: NONE SEEN
Bilirubin,UR: NEGATIVE
Glucose,UR: >=500 mg/dL (ref 0–75)
Leukocyte Esterase: NEGATIVE
Nitrite: NEGATIVE
Protein: 30
RBC,UR: <1 /HPF (ref 0–5)
Squamous Epithelial: NONE SEEN
WBC UR: <1 /HPF (ref 0–5)

## 2013-08-27 LAB — CBC
Platelet: 346 10*3/uL (ref 150–440)
RBC: 4.72 10*6/uL (ref 4.40–5.90)
RDW: 15.9 % — ABNORMAL HIGH (ref 11.5–14.5)

## 2013-08-27 LAB — TRIGLYCERIDES: Triglycerides: >4000 mg/dL — ABNORMAL HIGH (ref 0–200)

## 2013-08-27 LAB — LIPASE, BLOOD: Lipase: 1016 U/L — ABNORMAL HIGH (ref 73–393)

## 2013-08-28 LAB — BASIC METABOLIC PANEL
Anion Gap: 7 (ref 7–16)
BUN: 9 mg/dL (ref 7–18)
Creatinine: 0.87 mg/dL (ref 0.60–1.30)
EGFR (African American): >60
Osmolality: 276 (ref 275–301)
Potassium: 3.7 mmol/L (ref 3.5–5.1)

## 2013-08-28 LAB — WBC: WBC: 8.7 10*3/uL (ref 3.8–10.6)

## 2013-08-30 LAB — LIPASE, BLOOD: Lipase: 339 U/L (ref 73–393)

## 2013-09-12 ENCOUNTER — Ambulatory Visit: Payer: Self-pay | Admitting: Internal Medicine

## 2014-02-08 LAB — TROPONIN I

## 2014-02-09 ENCOUNTER — Inpatient Hospital Stay: Payer: Self-pay | Admitting: Internal Medicine

## 2014-02-09 LAB — COMPREHENSIVE METABOLIC PANEL
ANION GAP: 7 (ref 7–16)
Albumin: 2.8 g/dL — ABNORMAL LOW (ref 3.4–5.0)
Albumin: 3 g/dL — ABNORMAL LOW (ref 3.4–5.0)
Anion Gap: 10 (ref 7–16)
Bilirubin,Total: 2.3 mg/dL — ABNORMAL HIGH (ref 0.2–1.0)
Bilirubin,Total: 1.9 mg/dL — ABNORMAL HIGH (ref 0.2–1.0)
CHLORIDE: 84 mmol/L — AB (ref 98–107)
CHLORIDE: 92 mmol/L — AB (ref 98–107)
CO2: 27 mmol/L (ref 21–32)
Co2: 25 mmol/L (ref 21–32)
GLUCOSE: 557 mg/dL — AB (ref 65–99)
GLUCOSE: 324 mg/dL — AB (ref 65–99)
POTASSIUM: 3.2 mmol/L — AB (ref 3.5–5.1)
Potassium: 3.3 mmol/L — ABNORMAL LOW (ref 3.5–5.1)
SODIUM: 119 mmol/L — AB (ref 136–145)
SODIUM: 126 mmol/L — AB (ref 136–145)

## 2014-02-09 LAB — URINALYSIS, COMPLETE
Bacteria: NONE SEEN
Bilirubin,UR: NEGATIVE
Ketone: NEGATIVE
Leukocyte Esterase: NEGATIVE
Nitrite: NEGATIVE
PH: 5 (ref 4.5–8.0)
RBC,UR: NONE SEEN /HPF (ref 0–5)
Specific Gravity: 1.027 (ref 1.003–1.030)
Squamous Epithelial: <1

## 2014-02-09 LAB — CBC WITH DIFFERENTIAL/PLATELET
Bands: 1 %
Eosinophil: 5 %
HCT: 40.3 % (ref 40.0–52.0)
HGB: 13.5 g/dL (ref 13.0–18.0)
Lymphocytes: 39 %
MCH: 26.8 pg (ref 26.0–34.0)
MCHC: 33.5 g/dL (ref 32.0–36.0)
MCV: 80 fL (ref 80–100)
MONOS PCT: 4 %
PLATELETS: 329 10*3/uL (ref 150–440)
RBC: 5.03 10*6/uL (ref 4.40–5.90)
RDW: 15.4 % — AB (ref 11.5–14.5)
SEGMENTED NEUTROPHILS: 51 %
WBC: 8.9 10*3/uL (ref 3.8–10.6)

## 2014-02-09 LAB — LIPASE, BLOOD: Lipase: 298 U/L (ref 73–393)

## 2014-02-09 LAB — MAGNESIUM: Magnesium: 1.7 mg/dL — ABNORMAL LOW

## 2014-02-10 LAB — BASIC METABOLIC PANEL
ANION GAP: 3 — AB (ref 7–16)
Chloride: 95 mmol/L — ABNORMAL LOW (ref 98–107)
Co2: 28 mmol/L (ref 21–32)
Glucose: 278 mg/dL — ABNORMAL HIGH (ref 65–99)
POTASSIUM: 3.8 mmol/L (ref 3.5–5.1)
SODIUM: 126 mmol/L — AB (ref 136–145)

## 2014-02-10 LAB — CBC WITH DIFFERENTIAL/PLATELET
HCT: 36.6 % — ABNORMAL LOW (ref 40.0–52.0)
HGB: 12.1 g/dL — AB (ref 13.0–18.0)
Lymphocytes: 29 %
MCH: 26.5 pg (ref 26.0–34.0)
MCHC: 33.1 g/dL (ref 32.0–36.0)
MCV: 80 fL (ref 80–100)
Monocytes: 5 %
Platelet: 242 10*3/uL (ref 150–440)
RBC: 4.56 10*6/uL (ref 4.40–5.90)
RDW: 15.9 % — AB (ref 11.5–14.5)
Segmented Neutrophils: 62 %
VARIANT LYMPHOCYTE - H1-RLYMPH: 4 %
WBC: 9.8 10*3/uL (ref 3.8–10.6)

## 2014-02-10 LAB — LIPASE, BLOOD: LIPASE: 233 U/L (ref 73–393)

## 2014-02-10 LAB — BILIRUBIN, TOTAL: Bilirubin,Total: 1.7 mg/dL — ABNORMAL HIGH (ref 0.2–1.0)

## 2014-02-11 LAB — HEMOGLOBIN A1C: Hemoglobin A1C: 9.6 % — ABNORMAL HIGH (ref 4.2–6.3)

## 2014-02-11 LAB — HEMOGLOBIN: HGB: 11.9 g/dL — ABNORMAL LOW (ref 13.0–18.0)

## 2014-02-11 LAB — LIPASE, BLOOD: Lipase: 122 U/L (ref 73–393)

## 2014-02-12 ENCOUNTER — Telehealth: Payer: Self-pay | Admitting: *Deleted

## 2014-02-12 NOTE — Telephone Encounter (Signed)
Called patient to verify reason for admission for Acute Pancreatitis, Patient in hospital for approximately 48 HRs. Patient at home stated feeling much better, offered to schedule Hospital follow up patient declined at this time stated when he was sure of schedule he will call back and schedule appointment. FYI

## 2014-03-16 ENCOUNTER — Inpatient Hospital Stay: Payer: Self-pay | Admitting: Family Medicine

## 2014-03-16 LAB — CBC WITH DIFFERENTIAL/PLATELET
BANDS NEUTROPHIL: 1 %
BASOS ABS: 1 %
EOS PCT: 2 %
HCT: 41.8 % (ref 40.0–52.0)
LYMPHS PCT: 24 %
MCV: 80 fL (ref 80–100)
MONOS PCT: 4 %
Myelocyte: 1 %
Platelet: 301 10*3/uL (ref 150–440)
RBC: 5.24 10*6/uL (ref 4.40–5.90)
RDW: 16.2 % — AB (ref 11.5–14.5)
Segmented Neutrophils: 67 %
WBC: 10.9 10*3/uL — AB (ref 3.8–10.6)

## 2014-03-16 LAB — URINALYSIS, COMPLETE
BACTERIA: NONE SEEN
BILIRUBIN, UR: NEGATIVE
Glucose,UR: >=500 mg/dL (ref 0–75)
Hyaline Cast: 2
LEUKOCYTE ESTERASE: NEGATIVE
Nitrite: NEGATIVE
PH: 5 (ref 4.5–8.0)
Protein: 100
RBC,UR: 1 /HPF (ref 0–5)
Specific Gravity: 1.028 (ref 1.003–1.030)
WBC UR: NONE SEEN /HPF (ref 0–5)

## 2014-03-16 LAB — PROTIME-INR
INR: 1.1
Prothrombin Time: 14 secs (ref 11.5–14.7)

## 2014-03-16 LAB — COMPREHENSIVE METABOLIC PANEL
Albumin: 2.9 g/dL — ABNORMAL LOW (ref 3.4–5.0)
Anion Gap: 4 — ABNORMAL LOW (ref 7–16)
Bilirubin,Total: 1.8 mg/dL — ABNORMAL HIGH (ref 0.2–1.0)
CHLORIDE: 90 mmol/L — AB (ref 98–107)
CO2: 28 mmol/L (ref 21–32)
Glucose: 449 mg/dL — ABNORMAL HIGH (ref 65–99)
OSMOLALITY: 261 (ref 275–301)
POTASSIUM: 3.9 mmol/L (ref 3.5–5.1)
SODIUM: 122 mmol/L — AB (ref 136–145)
TOTAL PROTEIN: 7.3 g/dL (ref 6.4–8.2)

## 2014-03-16 LAB — LIPASE, BLOOD: Lipase: 211 U/L (ref 73–393)

## 2014-03-16 LAB — TROPONIN I

## 2014-03-16 LAB — TRIGLYCERIDES: Triglycerides: >4000 mg/dL — ABNORMAL HIGH (ref 0–200)

## 2014-03-17 LAB — COMPREHENSIVE METABOLIC PANEL
ANION GAP: 4 — AB (ref 7–16)
Albumin: 3 g/dL — ABNORMAL LOW (ref 3.4–5.0)
Alkaline Phosphatase: 56 U/L
Bilirubin,Total: 0.9 mg/dL (ref 0.2–1.0)
CHLORIDE: 96 mmol/L — AB (ref 98–107)
Co2: 31 mmol/L (ref 21–32)
Glucose: 225 mg/dL — ABNORMAL HIGH (ref 65–99)
Potassium: 3.7 mmol/L (ref 3.5–5.1)
Sodium: 131 mmol/L — ABNORMAL LOW (ref 136–145)
Total Protein: 7 g/dL (ref 6.4–8.2)

## 2014-03-17 LAB — CBC WITH DIFFERENTIAL/PLATELET
BASOS PCT: 0.3 %
Basophil #: 0 10*3/uL (ref 0.0–0.1)
EOS PCT: 1.2 %
Eosinophil #: 0.1 10*3/uL (ref 0.0–0.7)
HCT: 37.4 % — ABNORMAL LOW (ref 40.0–52.0)
HGB: 12 g/dL — AB (ref 13.0–18.0)
LYMPHS PCT: 18.1 %
Lymphocyte #: 2.1 10*3/uL (ref 1.0–3.6)
MCH: 25.5 pg — ABNORMAL LOW (ref 26.0–34.0)
MCHC: 32.1 g/dL (ref 32.0–36.0)
MCV: 80 fL (ref 80–100)
Monocyte #: 0.8 x10 3/mm (ref 0.2–1.0)
Monocyte %: 6.7 %
Neutrophil #: 8.5 10*3/uL — ABNORMAL HIGH (ref 1.4–6.5)
Neutrophil %: 73.7 %
PLATELETS: 230 10*3/uL (ref 150–440)
RBC: 4.7 10*6/uL (ref 4.40–5.90)
RDW: 16.1 % — ABNORMAL HIGH (ref 11.5–14.5)
WBC: 11.5 10*3/uL — ABNORMAL HIGH (ref 3.8–10.6)

## 2014-03-17 LAB — LIPASE, BLOOD: LIPASE: 1361 U/L — AB (ref 73–393)

## 2014-03-17 LAB — TRIGLYCERIDES: Triglycerides: 3755 mg/dL — ABNORMAL HIGH (ref 0–200)

## 2014-03-17 LAB — MAGNESIUM: MAGNESIUM: 1.3 mg/dL — AB

## 2014-03-18 LAB — BASIC METABOLIC PANEL
Anion Gap: 2 — ABNORMAL LOW (ref 7–16)
BUN: 8 mg/dL (ref 7–18)
CHLORIDE: 95 mmol/L — AB (ref 98–107)
CREATININE: 0.97 mg/dL (ref 0.60–1.30)
Calcium, Total: 7.3 mg/dL — ABNORMAL LOW (ref 8.5–10.1)
Co2: 30 mmol/L (ref 21–32)
EGFR (African American): >60
EGFR (Non-African Amer.): >60
GLUCOSE: 118 mg/dL — AB (ref 65–99)
Osmolality: 255 (ref 275–301)
Potassium: 3.6 mmol/L (ref 3.5–5.1)
Sodium: 127 mmol/L — ABNORMAL LOW (ref 136–145)

## 2014-03-18 LAB — TRIGLYCERIDES: TRIGLYCERIDES: 2319 mg/dL — AB (ref 0–200)

## 2014-03-18 LAB — MAGNESIUM: Magnesium: 1.9 mg/dL

## 2014-03-18 LAB — LIPASE, BLOOD: LIPASE: 162 U/L (ref 73–393)

## 2014-03-19 LAB — LIPASE, BLOOD: Lipase: 111 U/L (ref 73–393)

## 2014-03-19 LAB — BASIC METABOLIC PANEL
Anion Gap: 5 — ABNORMAL LOW (ref 7–16)
BUN: 6 mg/dL — AB (ref 7–18)
CHLORIDE: 95 mmol/L — AB (ref 98–107)
Calcium, Total: 7.9 mg/dL — ABNORMAL LOW (ref 8.5–10.1)
Co2: 28 mmol/L (ref 21–32)
Creatinine: 0.75 mg/dL (ref 0.60–1.30)
EGFR (African American): >60
Glucose: 104 mg/dL — ABNORMAL HIGH (ref 65–99)
OSMOLALITY: 255 (ref 275–301)
POTASSIUM: 3.3 mmol/L — AB (ref 3.5–5.1)
SODIUM: 128 mmol/L — AB (ref 136–145)

## 2014-03-19 LAB — HEMOGLOBIN A1C: HEMOGLOBIN A1C: 9.7 % — AB (ref 4.2–6.3)

## 2014-03-19 LAB — TRIGLYCERIDES: TRIGLYCERIDES: 1873 mg/dL — AB (ref 0–200)

## 2014-03-20 LAB — BASIC METABOLIC PANEL
ANION GAP: 6 — AB (ref 7–16)
BUN: 6 mg/dL — ABNORMAL LOW (ref 7–18)
CREATININE: 0.77 mg/dL (ref 0.60–1.30)
Calcium, Total: 8 mg/dL — ABNORMAL LOW (ref 8.5–10.1)
Chloride: 99 mmol/L (ref 98–107)
Co2: 29 mmol/L (ref 21–32)
EGFR (Non-African Amer.): >60
Glucose: 131 mg/dL — ABNORMAL HIGH (ref 65–99)
Osmolality: 268 (ref 275–301)
POTASSIUM: 3.2 mmol/L — AB (ref 3.5–5.1)
Sodium: 134 mmol/L — ABNORMAL LOW (ref 136–145)

## 2014-03-20 LAB — PLATELET COUNT: PLATELETS: 164 10*3/uL (ref 150–440)

## 2014-03-20 LAB — LIPASE, BLOOD: Lipase: 122 U/L (ref 73–393)

## 2014-03-20 LAB — TRIGLYCERIDES: Triglycerides: 1389 mg/dL — ABNORMAL HIGH (ref 0–200)

## 2014-03-21 LAB — LIPASE, BLOOD: Lipase: 128 U/L (ref 73–393)

## 2014-03-21 LAB — BASIC METABOLIC PANEL
Anion Gap: 7 (ref 7–16)
BUN: 5 mg/dL — ABNORMAL LOW (ref 7–18)
Calcium, Total: 8.5 mg/dL (ref 8.5–10.1)
Chloride: 101 mmol/L (ref 98–107)
Co2: 28 mmol/L (ref 21–32)
Creatinine: 0.84 mg/dL (ref 0.60–1.30)
EGFR (Non-African Amer.): >60
Glucose: 121 mg/dL — ABNORMAL HIGH (ref 65–99)
Osmolality: 270 (ref 275–301)
POTASSIUM: 3.3 mmol/L — AB (ref 3.5–5.1)
Sodium: 136 mmol/L (ref 136–145)

## 2014-03-21 LAB — TRIGLYCERIDES: TRIGLYCERIDES: 950 mg/dL — AB (ref 0–200)

## 2014-03-22 LAB — BASIC METABOLIC PANEL
ANION GAP: 7 (ref 7–16)
BUN: 7 mg/dL (ref 7–18)
CALCIUM: 8.6 mg/dL (ref 8.5–10.1)
CHLORIDE: 100 mmol/L (ref 98–107)
CREATININE: 0.82 mg/dL (ref 0.60–1.30)
Co2: 29 mmol/L (ref 21–32)
EGFR (African American): >60
EGFR (Non-African Amer.): >60
GLUCOSE: 158 mg/dL — AB (ref 65–99)
OSMOLALITY: 273 (ref 275–301)
Potassium: 3.9 mmol/L (ref 3.5–5.1)
Sodium: 136 mmol/L (ref 136–145)

## 2014-03-22 LAB — TRIGLYCERIDES: Triglycerides: 777 mg/dL — ABNORMAL HIGH (ref 0–200)

## 2014-03-23 LAB — BASIC METABOLIC PANEL
ANION GAP: 6 — AB (ref 7–16)
BUN: 9 mg/dL (ref 7–18)
CO2: 30 mmol/L (ref 21–32)
Calcium, Total: 8.8 mg/dL (ref 8.5–10.1)
Chloride: 101 mmol/L (ref 98–107)
Creatinine: 0.99 mg/dL (ref 0.60–1.30)
EGFR (African American): >60
GLUCOSE: 109 mg/dL — AB (ref 65–99)
Osmolality: 273 (ref 275–301)
Potassium: 3.7 mmol/L (ref 3.5–5.1)
SODIUM: 137 mmol/L (ref 136–145)

## 2014-03-23 LAB — LIPASE, BLOOD: LIPASE: 97 U/L (ref 73–393)

## 2014-03-23 LAB — PLATELET COUNT: Platelet: 267 10*3/uL (ref 150–440)

## 2014-03-23 LAB — TRIGLYCERIDES: Triglycerides: 828 mg/dL — ABNORMAL HIGH (ref 0–200)

## 2014-04-09 ENCOUNTER — Telehealth: Payer: Self-pay | Admitting: Internal Medicine

## 2014-04-09 DIAGNOSIS — E1149 Type 2 diabetes mellitus with other diabetic neurological complication: Secondary | ICD-10-CM

## 2014-04-09 DIAGNOSIS — E1165 Type 2 diabetes mellitus with hyperglycemia: Secondary | ICD-10-CM

## 2014-04-09 DIAGNOSIS — IMO0002 Reserved for concepts with insufficient information to code with codable children: Secondary | ICD-10-CM

## 2014-04-09 DIAGNOSIS — K859 Acute pancreatitis without necrosis or infection, unspecified: Secondary | ICD-10-CM

## 2014-04-09 MED ORDER — OMEGA-3-ACID ETHYL ESTERS 1 G PO CAPS: 4.0000 g | ORAL_CAPSULE | Freq: Every day | ORAL | Status: DC

## 2014-05-08 ENCOUNTER — Ambulatory Visit (INDEPENDENT_AMBULATORY_CARE_PROVIDER_SITE_OTHER): Payer: BC Managed Care – PPO | Admitting: Adult Health

## 2014-05-08 ENCOUNTER — Encounter: Payer: Self-pay | Admitting: Adult Health

## 2014-05-08 VITALS — BP 120/81 | HR 87 | Temp 98.7°F | Resp 16 | Ht 70.0 in | Wt 255.5 lb

## 2014-05-08 DIAGNOSIS — J0100 Acute maxillary sinusitis, unspecified: Secondary | ICD-10-CM

## 2014-05-08 MED ORDER — AMOXICILLIN-POT CLAVULANATE 875-125 MG PO TABS: 1.0000 | ORAL_TABLET | Freq: Two times a day (BID) | ORAL | Status: DC

## 2014-05-08 NOTE — Progress Notes (Signed)
Pre visit review using our clinic review tool, if applicable. No additional management support is needed unless otherwise documented below in the visit note. 

## 2014-05-08 NOTE — Progress Notes (Signed)
Patient ID: Sergio [name], male   DOB: 1976-01-24, 38 y.o.   MRN: [id number]   Subjective:    Patient ID: Sergio [name], male    DOB: 1976-03-05, 38 y.o.   MRN: [id number]  HPI  Sinus pressure, HA, sore throat, post nasal drip, greenish sinus drainage. No fever, chills. Has felt warm. Has taken Advil without any relief.  Past Medical History  Diagnosis Date  . Chronic pancreatitis May 2009    secondary to hypertirglyceridemia   . Gastric ulcer 2009    by endo  . Sleep apnea 1999    uses CPAP, auto titrates,  [place])  . Chicken pox   . Diabetes mellitus   . HTN (hypertension)   . Hypertriglyceridemia     severe    Current Outpatient Prescriptions on File Prior to Visit  Medication Sig Dispense Refill  . ALPRAZolam (XANAX) 0.5 MG tablet Take 1 tablet (0.5 mg total) by mouth at bedtime as needed.  30 tablet  5  . aspirin EC 81 MG tablet Take 81 mg by mouth daily.        . carvedilol (COREG) 6.25 MG tablet Take 6.25 mg by mouth 2 (two) times daily with a meal.        . gabapentin (NEURONTIN) 300 MG capsule TAKE ONE CAPSULE THREE TIMES A DAY  90 capsule  1  . gemfibrozil (LOPID) 600 MG tablet Take 600 mg by mouth 2 (two) times daily before a meal.      . lisinopril (PRINIVIL,ZESTRIL) 20 MG tablet Take 20 mg by mouth daily.        . metFORMIN (GLUCOPHAGE) 1000 MG tablet Take 1,000 mg by mouth 2 (two) times daily with a meal.        . omega-3 acid ethyl esters (LOVAZA) 1 G capsule Take 4 capsules (4 g total) by mouth daily.  120 capsule  11  . omeprazole (PRILOSEC) 20 MG capsule Take 20 mg by mouth daily.        . sertraline (ZOLOFT) 100 MG tablet Take 1 tablet (100 mg total) by mouth daily.  90 tablet  3   No current facility-administered medications on file prior to visit.     Review of Systems  Constitutional: Negative for fever and chills.  HENT: Positive for congestion, ear pain, postnasal drip, rhinorrhea and sinus pressure. Negative for sore throat.   Respiratory: Positive  for cough. Negative for shortness of breath and wheezing.   Gastrointestinal: Negative.   All other systems reviewed and are negative.      Objective:  BP 120/81  Pulse 87  Temp(Src) 98.7 F (37.1 C) (Oral)  Resp 16  Ht 5\' 10"  (1.778 m)  Wt 255 lb 8 oz (115.894 kg)  BMI 36.66 kg/m2  SpO2 97%   Physical Exam  Constitutional: He is oriented to person, place, and time. He appears well-developed and well-nourished. No distress.  HENT:  Head: Normocephalic and atraumatic.  Right Ear: External ear normal.  Left Ear: External ear normal.  Mouth/Throat: No oropharyngeal exudate.  Eyes: Conjunctivae and EOM are normal.  Neck: Normal range of motion. Neck supple.  Cardiovascular: Normal rate, regular rhythm and normal heart sounds.  Exam reveals no gallop and no friction rub.   No murmur heard. Pulmonary/Chest: Effort normal and breath sounds normal. No respiratory distress. He has no wheezes. He has no rales.  Musculoskeletal: Normal range of motion.  Lymphadenopathy:    He has no cervical adenopathy.  Neurological: He is  alert and oriented to person, place, and time.  Skin: Skin is warm and dry.  Psychiatric: He has a normal mood and affect. His behavior is normal. Judgment and thought content normal.      Assessment & Plan:   1. Acute maxillary sinusitis, recurrence not specified Augmentin bid x 7 days RTC if no improvement within 4-5 days

## 2014-05-20 ENCOUNTER — Encounter: Payer: Self-pay | Admitting: *Deleted

## 2014-05-27 ENCOUNTER — Inpatient Hospital Stay: Payer: Self-pay | Admitting: Internal Medicine

## 2014-05-27 LAB — CBC WITH DIFFERENTIAL/PLATELET
Basophil: 1 %
Comment - H1-Com1: NORMAL
EOS PCT: 2 %
HCT: 36.4 % — AB (ref 40.0–52.0)
LYMPHS PCT: 44 %
MCV: 78 fL — ABNORMAL LOW (ref 80–100)
MONOS PCT: 5 %
PLATELETS: 358 10*3/uL (ref 150–440)
RBC: 4.65 10*6/uL (ref 4.40–5.90)
RDW: 16.5 % — ABNORMAL HIGH (ref 11.5–14.5)
SEGMENTED NEUTROPHILS: 48 %
WBC: 9.2 10*3/uL (ref 3.8–10.6)

## 2014-05-27 LAB — COMPREHENSIVE METABOLIC PANEL
ALBUMIN: 2.2 g/dL — AB (ref 3.4–5.0)
ANION GAP: 10 (ref 7–16)
Albumin: 2.5 g/dL — ABNORMAL LOW (ref 3.4–5.0)
Anion Gap: 13 (ref 7–16)
Bilirubin,Total: 1.5 mg/dL — ABNORMAL HIGH (ref 0.2–1.0)
Bilirubin,Total: 2 mg/dL — ABNORMAL HIGH (ref 0.2–1.0)
CHLORIDE: 93 mmol/L — AB (ref 98–107)
CO2: 23 mmol/L (ref 21–32)
CO2: 24 mmol/L (ref 21–32)
Chloride: 89 mmol/L — ABNORMAL LOW (ref 98–107)
Glucose: 443 mg/dL — ABNORMAL HIGH (ref 65–99)
Glucose: 637 mg/dL (ref 65–99)
POTASSIUM: 3.4 mmol/L — AB (ref 3.5–5.1)
POTASSIUM: 3.5 mmol/L (ref 3.5–5.1)
SODIUM: 125 mmol/L — AB (ref 136–145)
Sodium: 127 mmol/L — ABNORMAL LOW (ref 136–145)

## 2014-05-27 LAB — URINALYSIS, COMPLETE
BACTERIA: NONE SEEN
BILIRUBIN, UR: NEGATIVE
Blood: NEGATIVE
Glucose,UR: >=500 mg/dL (ref 0–75)
KETONE: NEGATIVE
LEUKOCYTE ESTERASE: NEGATIVE
NITRITE: NEGATIVE
Ph: 5 (ref 4.5–8.0)
Specific Gravity: 1.03 (ref 1.003–1.030)
WBC UR: 1 /HPF (ref 0–5)

## 2014-05-27 LAB — LIPID PANEL
Cholesterol: 281 mg/dL — ABNORMAL HIGH (ref 0–200)
HDL Cholesterol: 15 mg/dL — ABNORMAL LOW (ref 40–60)
Triglycerides: >4000 mg/dL — ABNORMAL HIGH (ref 0–200)

## 2014-05-27 LAB — LIPASE, BLOOD
LIPASE: 372 U/L (ref 73–393)
Lipase: 256 U/L (ref 73–393)

## 2014-05-28 LAB — LIPASE, BLOOD: Lipase: 551 U/L — ABNORMAL HIGH (ref 73–393)

## 2014-05-28 LAB — COMPREHENSIVE METABOLIC PANEL
Albumin: 5.8 g/dL — ABNORMAL HIGH (ref 3.4–5.0)
Anion Gap: 8 (ref 7–16)
BILIRUBIN TOTAL: 0.9 mg/dL (ref 0.2–1.0)
Chloride: 95 mmol/L — ABNORMAL LOW (ref 98–107)
Co2: 26 mmol/L (ref 21–32)
Glucose: 252 mg/dL — ABNORMAL HIGH (ref 65–99)
Potassium: 4.1 mmol/L (ref 3.5–5.1)
Sodium: 129 mmol/L — ABNORMAL LOW (ref 136–145)

## 2014-05-29 LAB — TRIGLYCERIDES: Triglycerides: 3085 mg/dL — ABNORMAL HIGH (ref 0–200)

## 2014-05-30 LAB — BASIC METABOLIC PANEL
Anion Gap: 6 — ABNORMAL LOW (ref 7–16)
BUN: 7 mg/dL (ref 7–18)
CREATININE: 0.71 mg/dL (ref 0.60–1.30)
Calcium, Total: 7.9 mg/dL — ABNORMAL LOW (ref 8.5–10.1)
Chloride: 98 mmol/L (ref 98–107)
Co2: 29 mmol/L (ref 21–32)
EGFR (African American): >60
EGFR (Non-African Amer.): >60
GLUCOSE: 159 mg/dL — AB (ref 65–99)
OSMOLALITY: 268 (ref 275–301)
Potassium: 4.4 mmol/L (ref 3.5–5.1)
Sodium: 133 mmol/L — ABNORMAL LOW (ref 136–145)

## 2014-06-01 LAB — LIPASE, BLOOD: LIPASE: 96 U/L (ref 73–393)

## 2014-06-02 LAB — TRIGLYCERIDES: Triglycerides: 1990 mg/dL — ABNORMAL HIGH (ref 0–200)

## 2014-07-19 ENCOUNTER — Other Ambulatory Visit: Payer: Self-pay | Admitting: Internal Medicine

## 2014-07-21 ENCOUNTER — Encounter (HOSPITAL_COMMUNITY): Payer: Self-pay | Admitting: Emergency Medicine

## 2014-07-21 ENCOUNTER — Inpatient Hospital Stay (HOSPITAL_COMMUNITY)
Admission: EM | Admit: 2014-07-21 | Discharge: 2014-07-27 | DRG: 440 | Disposition: A | Discharge status: Home / Self Care | Payer: BC Managed Care – PPO | Attending: Internal Medicine | Admitting: Internal Medicine

## 2014-07-21 DIAGNOSIS — Z9641 Presence of insulin pump (external) (internal): Secondary | ICD-10-CM | POA: Diagnosis present

## 2014-07-21 DIAGNOSIS — IMO0002 Reserved for concepts with insufficient information to code with codable children: Secondary | ICD-10-CM | POA: Diagnosis present

## 2014-07-21 DIAGNOSIS — I10 Essential (primary) hypertension: Secondary | ICD-10-CM | POA: Diagnosis present

## 2014-07-21 DIAGNOSIS — G4730 Sleep apnea, unspecified: Secondary | ICD-10-CM | POA: Diagnosis present

## 2014-07-21 DIAGNOSIS — Z8711 Personal history of peptic ulcer disease: Secondary | ICD-10-CM

## 2014-07-21 DIAGNOSIS — E1165 Type 2 diabetes mellitus with hyperglycemia: Secondary | ICD-10-CM | POA: Diagnosis present

## 2014-07-21 DIAGNOSIS — Z794 Long term (current) use of insulin: Secondary | ICD-10-CM

## 2014-07-21 DIAGNOSIS — R109 Unspecified abdominal pain: Secondary | ICD-10-CM | POA: Diagnosis present

## 2014-07-21 DIAGNOSIS — E669 Obesity, unspecified: Secondary | ICD-10-CM

## 2014-07-21 DIAGNOSIS — R197 Diarrhea, unspecified: Secondary | ICD-10-CM

## 2014-07-21 DIAGNOSIS — Z9114 Patient's other noncompliance with medication regimen: Secondary | ICD-10-CM | POA: Diagnosis present

## 2014-07-21 DIAGNOSIS — Z886 Allergy status to analgesic agent status: Secondary | ICD-10-CM

## 2014-07-21 DIAGNOSIS — F411 Generalized anxiety disorder: Secondary | ICD-10-CM

## 2014-07-21 DIAGNOSIS — E781 Pure hyperglyceridemia: Secondary | ICD-10-CM

## 2014-07-21 DIAGNOSIS — F329 Major depressive disorder, single episode, unspecified: Secondary | ICD-10-CM | POA: Diagnosis present

## 2014-07-21 DIAGNOSIS — Z6835 Body mass index (BMI) 35.0-35.9, adult: Secondary | ICD-10-CM

## 2014-07-21 DIAGNOSIS — E1141 Type 2 diabetes mellitus with diabetic mononeuropathy: Secondary | ICD-10-CM

## 2014-07-21 DIAGNOSIS — K859 Acute pancreatitis without necrosis or infection, unspecified: Secondary | ICD-10-CM | POA: Diagnosis present

## 2014-07-21 DIAGNOSIS — E1149 Type 2 diabetes mellitus with other diabetic neurological complication: Secondary | ICD-10-CM | POA: Diagnosis present

## 2014-07-21 DIAGNOSIS — K861 Other chronic pancreatitis: Secondary | ICD-10-CM | POA: Diagnosis present

## 2014-07-21 LAB — COMPREHENSIVE METABOLIC PANEL
ALK PHOS: 69 U/L (ref 39–117)
ALT: 15 U/L (ref 0–53)
AST: 18 U/L (ref 0–37)
Albumin: 4 g/dL (ref 3.5–5.2)
Anion gap: 19 — ABNORMAL HIGH (ref 5–15)
BUN: 11 mg/dL (ref 6–23)
CO2: 20 mEq/L (ref 19–32)
Calcium: 8.7 mg/dL (ref 8.4–10.5)
Chloride: 95 mEq/L — ABNORMAL LOW (ref 96–112)
Creatinine, Ser: 0.76 mg/dL (ref 0.50–1.35)
GLUCOSE: 353 mg/dL — AB (ref 70–99)
POTASSIUM: 3.9 meq/L (ref 3.7–5.3)
SODIUM: 134 meq/L — AB (ref 137–147)
Total Bilirubin: 0.2 mg/dL — ABNORMAL LOW (ref 0.3–1.2)
Total Protein: 8.3 g/dL (ref 6.0–8.3)

## 2014-07-21 LAB — CBC
HEMATOCRIT: 38.3 % — AB (ref 39.0–52.0)
Hemoglobin: 14.5 g/dL (ref 13.0–17.0)
MCH: 30 pg (ref 26.0–34.0)
MCHC: 38.8 g/dL — ABNORMAL HIGH (ref 30.0–36.0)
MCV: 77.8 fL — ABNORMAL LOW (ref 78.0–100.0)
Platelets: 263 10*3/uL (ref 150–400)
RBC: 4.92 MIL/uL (ref 4.22–5.81)
RDW: 15.5 % (ref 11.5–15.5)
WBC: 7.4 10*3/uL (ref 4.0–10.5)

## 2014-07-21 LAB — GLUCOSE, CAPILLARY
GLUCOSE-CAPILLARY: 207 mg/dL — AB (ref 70–99)
GLUCOSE-CAPILLARY: 167 mg/dL — AB (ref 70–99)
Glucose-Capillary: 149 mg/dL — ABNORMAL HIGH (ref 70–99)

## 2014-07-21 LAB — CBG MONITORING, ED
Glucose-Capillary: 337 mg/dL — ABNORMAL HIGH (ref 70–99)
Glucose-Capillary: 279 mg/dL — ABNORMAL HIGH (ref 70–99)
Glucose-Capillary: 244 mg/dL — ABNORMAL HIGH (ref 70–99)

## 2014-07-21 LAB — LIPASE, BLOOD: Lipase: 20 U/L (ref 11–59)

## 2014-07-21 MED ORDER — SODIUM CHLORIDE 0.9 % IV SOLN
INTRAVENOUS | Status: DC
  Administered 2014-07-21 – 2014-07-25 (×11): via INTRAVENOUS

## 2014-07-21 MED ORDER — GABAPENTIN 300 MG PO CAPS
300.0000 mg | ORAL_CAPSULE | Freq: Every day | ORAL | Status: DC
  Administered 2014-07-21 – 2014-07-26 (×6): 300 mg via ORAL
  Filled 2014-07-21 (×7): qty 1

## 2014-07-21 MED ORDER — LISINOPRIL 20 MG PO TABS
20.0000 mg | ORAL_TABLET | Freq: Every day | ORAL | Status: DC
  Administered 2014-07-21 – 2014-07-27 (×7): 20 mg via ORAL
  Filled 2014-07-21 (×7): qty 1

## 2014-07-21 MED ORDER — ENOXAPARIN SODIUM 60 MG/0.6ML ~~LOC~~ SOLN
60.0000 mg | SUBCUTANEOUS | Status: DC
  Administered 2014-07-21 – 2014-07-26 (×6): 60 mg via SUBCUTANEOUS
  Filled 2014-07-21 (×7): qty 0.6

## 2014-07-21 MED ORDER — ASPIRIN EC 81 MG PO TBEC
81.0000 mg | DELAYED_RELEASE_TABLET | Freq: Every day | ORAL | Status: DC
  Administered 2014-07-21 – 2014-07-27 (×7): 81 mg via ORAL
  Filled 2014-07-21 (×7): qty 1

## 2014-07-21 MED ORDER — SERTRALINE HCL 100 MG PO TABS
100.0000 mg | ORAL_TABLET | Freq: Every day | ORAL | Status: DC
  Administered 2014-07-21 – 2014-07-27 (×7): 100 mg via ORAL
  Filled 2014-07-21 (×7): qty 1

## 2014-07-21 MED ORDER — HYDROMORPHONE HCL 1 MG/ML IJ SOLN
1.0000 mg | Freq: Once | INTRAMUSCULAR | Status: AC
  Administered 2014-07-21: 1 mg via INTRAVENOUS
  Filled 2014-07-21: qty 1

## 2014-07-21 MED ORDER — SODIUM CHLORIDE 0.9 % IV SOLN
Freq: Once | INTRAVENOUS | Status: AC
  Administered 2014-07-21: 03:00:00 via INTRAVENOUS

## 2014-07-21 MED ORDER — ONDANSETRON HCL 4 MG/2ML IJ SOLN
4.0000 mg | Freq: Once | INTRAMUSCULAR | Status: AC
  Administered 2014-07-21: 4 mg via INTRAVENOUS
  Filled 2014-07-21: qty 2

## 2014-07-21 MED ORDER — ACETAMINOPHEN 650 MG RE SUPP
650.0000 mg | Freq: Four times a day (QID) | RECTAL | Status: DC | PRN
  Filled 2014-07-21: qty 1

## 2014-07-21 MED ORDER — GEMFIBROZIL 600 MG PO TABS
600.0000 mg | ORAL_TABLET | Freq: Two times a day (BID) | ORAL | Status: DC
  Administered 2014-07-21 – 2014-07-27 (×12): 600 mg via ORAL
  Filled 2014-07-21 (×15): qty 1

## 2014-07-21 MED ORDER — PANTOPRAZOLE SODIUM 40 MG PO TBEC
40.0000 mg | DELAYED_RELEASE_TABLET | Freq: Every day | ORAL | Status: DC
  Administered 2014-07-21 – 2014-07-27 (×7): 40 mg via ORAL
  Filled 2014-07-21 (×7): qty 1

## 2014-07-21 MED ORDER — ONDANSETRON HCL 4 MG PO TABS: 4.0000 mg | ORAL_TABLET | Freq: Four times a day (QID) | ORAL | Status: DC | PRN

## 2014-07-21 MED ORDER — CARVEDILOL 6.25 MG PO TABS
6.2500 mg | ORAL_TABLET | Freq: Two times a day (BID) | ORAL | Status: DC
  Administered 2014-07-21 – 2014-07-27 (×12): 6.25 mg via ORAL
  Filled 2014-07-21 (×15): qty 1

## 2014-07-21 MED ORDER — ENOXAPARIN SODIUM 40 MG/0.4ML ~~LOC~~ SOLN: 40.0000 mg | SUBCUTANEOUS | Status: DC

## 2014-07-21 MED ORDER — SODIUM CHLORIDE 0.9 % IV SOLN
INTRAVENOUS | Status: DC
Start: 2014-07-21 — End: 2014-07-21

## 2014-07-21 MED ORDER — ALPRAZOLAM 0.5 MG PO TABS
0.5000 mg | ORAL_TABLET | Freq: Three times a day (TID) | ORAL | Status: DC | PRN
  Administered 2014-07-21 – 2014-07-26 (×11): 0.5 mg via ORAL
  Filled 2014-07-21 (×11): qty 1

## 2014-07-21 MED ORDER — SODIUM CHLORIDE 0.9 % IV BOLUS (SEPSIS)
1000.0000 mL | Freq: Once | INTRAVENOUS | Status: AC
  Administered 2014-07-21: 1000 mL via INTRAVENOUS

## 2014-07-21 MED ORDER — ACETAMINOPHEN 325 MG PO TABS
650.0000 mg | ORAL_TABLET | Freq: Four times a day (QID) | ORAL | Status: DC | PRN
  Administered 2014-07-23 – 2014-07-24 (×2): 650 mg via ORAL
  Filled 2014-07-21 (×3): qty 2

## 2014-07-21 MED ORDER — HYDROMORPHONE HCL 2 MG/ML IJ SOLN
2.0000 mg | INTRAMUSCULAR | Status: DC | PRN
  Administered 2014-07-21 – 2014-07-22 (×4): 2 mg via INTRAVENOUS
  Filled 2014-07-21 (×4): qty 1

## 2014-07-21 MED ORDER — SODIUM CHLORIDE 0.9 % IV SOLN
Freq: Once | INTRAVENOUS | Status: AC
  Administered 2014-07-21: 05:00:00 via INTRAVENOUS

## 2014-07-21 MED ORDER — HYDROMORPHONE HCL 1 MG/ML IJ SOLN
1.0000 mg | INTRAMUSCULAR | Status: DC | PRN
  Administered 2014-07-21 (×2): 1 mg via INTRAVENOUS
  Filled 2014-07-21 (×3): qty 1

## 2014-07-21 MED ORDER — OMEGA-3-ACID ETHYL ESTERS 1 G PO CAPS
4.0000 g | ORAL_CAPSULE | Freq: Every day | ORAL | Status: DC
  Administered 2014-07-21 – 2014-07-26 (×6): 4 g via ORAL
  Filled 2014-07-21 (×7): qty 4

## 2014-07-21 MED ORDER — INSULIN ASPART 100 UNIT/ML ~~LOC~~ SOLN: 0.0000 [IU] | Freq: Three times a day (TID) | SUBCUTANEOUS | Status: DC

## 2014-07-21 MED ORDER — ONDANSETRON HCL 4 MG/2ML IJ SOLN
4.0000 mg | Freq: Four times a day (QID) | INTRAMUSCULAR | Status: DC | PRN
  Administered 2014-07-21 – 2014-07-23 (×3): 4 mg via INTRAVENOUS
  Filled 2014-07-21 (×3): qty 2

## 2014-07-21 NOTE — ED Provider Notes (Signed)
Patient signed out to me by Sergio Glen, NP-C  Plan: Discharge of patient's symptoms are improving, admit if pain is not improving and remains uncontrolled.  Patient is a 38 year old male with chronic pancreatitis due to to IDDM who presents with 3 days of worsening abdominal pain, nausea, vomiting. Patient treated in the ER with pain medicine, insulin boluses given by himself or insulin pump, fluid resuscitation. On my exam patient is well-appearing in no obvious distress. Patient still complaining of 8/10 pain in his epigastric and left upper quadrant region. Patient stating that he has had multiple admissions in the past [facility], and he feels that this time he may need to be admitted for pain control and fluid resuscitation. Patient states she typically has to receive 7-8 L of fluid and typically receives an insulin drip as well. Patient's lipase is not elevated today, and patient states that it is typically not elevated when he goes through his episodes. We will consult medicine for admission.  Medicine agrees to admit patient to [place] floor.The patient appears reasonably stabilized for admission considering the current resources, flow, and capabilities available in the ED at this time, and I doubt any other [facility] requiring further screening and/or treatment in the ED prior to admission.  BP 129/75  Pulse 87  Temp(Src) 98.1 F (36.7 C) (Oral)  Resp 18  Ht 6' (1.829 m)  Wt 260 lb (117.935 kg)  BMI 35.25 kg/m2  SpO2 96%  Signed,  Dahlia Bailiff, PA-C 5:41 PM  This patient seen and discussed with Dr. Davonna Belling, M.D.  Carrie Mew, PA-C 07/21/14 [phone]

## 2014-07-21 NOTE — ED Provider Notes (Signed)
CSN: [id number]     Arrival date & time 07/21/14  0137 History   First MD Initiated Contact with Patient 07/21/14 0211     Chief Complaint  Patient presents with  . Abdominal Pain     (Consider location/radiation/quality/duration/timing/severity/associated sxs/prior Treatment) HPI Comments: Patient with chronic pancreatitis due to DM states for the past 3 days has had increased pain nausea and vomiting  Has tried bowel rest without resolution Has no antiemetics or pan medication at home has not contacted his PCP   Patient is a 38 y.o. male presenting with abdominal pain. The history is provided by the patient.  Abdominal Pain Pain location:  Epigastric and LUQ Pain quality: aching and sharp   Pain severity:  Moderate Onset quality:  Gradual Duration:  3 days Timing:  Constant Progression:  Unchanged Chronicity:  Recurrent Context: retching   Relieved by:  Nothing Worsened by:  Eating Ineffective treatments: bowel rest. Associated symptoms: nausea and vomiting   Associated symptoms: no chest pain, no chills, no diarrhea, no dysuria, no fever and no shortness of breath   Risk factors: obesity   Risk factors comment:  Diabetes   Past Medical History  Diagnosis Date  . Chronic pancreatitis May 2009    secondary to hypertirglyceridemia   . Gastric ulcer 2009    by endo  . Sleep apnea 1999    uses CPAP, auto titrates,  [place]s)  . Chicken pox   . Diabetes mellitus   . HTN (hypertension)   . Hypertriglyceridemia     severe   Past Surgical History  Procedure Laterality Date  . Tonsillectomy  2003  . Vasectomy    . Lumbar disc surgery  2008   Family History  Problem Relation Age of Onset  . Adopted: Yes  . Hypertension Mother   . Hyperlipidemia Mother   . Hypertension Father   . Hyperlipidemia Father    History  Substance Use Topics  . Smoking status: Never Smoker   . Smokeless tobacco: Never Used  . Alcohol Use: Yes     Comment: once month    Review of  Systems  Constitutional: Negative for fever and chills.  Respiratory: Negative for shortness of breath.   Cardiovascular: Negative for chest pain.  Gastrointestinal: Positive for nausea, vomiting and abdominal pain. Negative for diarrhea.  Genitourinary: Negative for dysuria.  Musculoskeletal: Negative for back pain.  All other systems reviewed and are negative.     Allergies  Codeine and Ivp dye  Home Medications   Prior to Admission medications   Medication Sig Start Date End Date Taking? Authorizing Provider  ALPRAZolam Duanne Moron) 0.5 MG tablet Take 0.5 mg by mouth 3 (three) times daily as needed for anxiety.   Yes Historical Provider, MD  aspirin EC 81 MG tablet Take 81 mg by mouth daily.     Yes Historical Provider, MD  carvedilol (COREG) 6.25 MG tablet Take 6.25 mg by mouth 2 (two) times daily with a meal.     Yes Historical Provider, MD  gabapentin (NEURONTIN) 300 MG capsule Take 300 mg by mouth at bedtime.   Yes Historical Provider, MD  gemfibrozil (LOPID) 600 MG tablet Take 600 mg by mouth 2 (two) times daily before a meal.   Yes Historical Provider, MD  lisinopril (PRINIVIL,ZESTRIL) 20 MG tablet Take 20 mg by mouth daily.     Yes Historical Provider, MD  metFORMIN (GLUCOPHAGE) 500 MG tablet Take 500 mg by mouth daily with breakfast.   Yes Historical Provider, MD  omega-3 acid ethyl esters (LOVAZA) 1 G capsule Take 4 capsules (4 g total) by mouth daily. 04/09/14  Yes Crecencio Mc, MD  omeprazole (PRILOSEC) 20 MG capsule Take 20 mg by mouth daily.     Yes Historical Provider, MD  sertraline (ZOLOFT) 100 MG tablet Take 1 tablet (100 mg total) by mouth daily. 05/29/12  Yes Crecencio Mc, MD  UNABLE TO FIND Humulin R 500 U via pump.   Yes Historical Provider, MD  ALPRAZolam Duanne Moron) 0.5 MG tablet Take 1 tablet (0.5 mg total) by mouth at bedtime as needed. 01/22/13   Crecencio Mc, MD   BP 110/66  Pulse 83  Temp(Src) 98.2 F (36.8 C) (Oral)  Resp 18  Ht 6' (1.829 m)  Wt 260 lb  (117.935 kg)  BMI 35.25 kg/m2  SpO2 94% Physical Exam  Nursing note and vitals reviewed. Constitutional: He is oriented to person, place, and time. He appears well-developed and well-nourished.  HENT:  Head: Normocephalic.  Eyes: Pupils are equal, round, and reactive to light.  Neck: Normal range of motion.  Cardiovascular: Normal rate and regular rhythm.   Pulmonary/Chest: Effort normal and breath sounds normal.  Abdominal: Soft. Bowel sounds are normal. He exhibits no distension. There is tenderness in the epigastric area and left upper quadrant.  Musculoskeletal: Normal range of motion.  Neurological: He is alert and oriented to person, place, and time.  Skin: Skin is warm and dry.    ED Course  Procedures (including critical care time) Labs Review Labs Reviewed  CBC - Abnormal; Notable for the following:    HCT 38.3 (*)    MCV 77.8 (*)    MCHC 38.8 (*)    All other components within normal limits  COMPREHENSIVE METABOLIC PANEL - Abnormal; Notable for the following:    Sodium 134 (*)    Chloride 95 (*)    Glucose, Bld 353 (*)    Total Bilirubin 0.2 (*)    Anion gap 19 (*)    All other components within normal limits  GLUCOSE, CAPILLARY - Abnormal; Notable for the following:    Glucose-Capillary 207 (*)    All other components within normal limits  GLUCOSE, CAPILLARY - Abnormal; Notable for the following:    Glucose-Capillary 167 (*)    All other components within normal limits  CBG MONITORING, ED - Abnormal; Notable for the following:    Glucose-Capillary 337 (*)    All other components within normal limits  CBG MONITORING, ED - Abnormal; Notable for the following:    Glucose-Capillary 279 (*)    All other components within normal limits  CBG MONITORING, ED - Abnormal; Notable for the following:    Glucose-Capillary 244 (*)    All other components within normal limits  LIPASE, BLOOD  CBC  COMPREHENSIVE METABOLIC PANEL  LIPID PANEL    Imaging Review No  results found.   EKG Interpretation None     After 1 L fluid, patient's blood sugar is still 279.  He was instructed to give himself an insulin bolus to his insulin pump, which he did receive 2.1 cc of his insulin in approximately 30 minutes.  He will have another CBG.  His labs have been reviewed.  He does not have overwhelming infection.  Lipase is normal, which is consistent with a patient with chronic pancreatitis.  LFTs appear to be all within normal parameters, Colace to get this patient comfortable enough that he can tolerate by mouth fluids and be discharged home  with pain control and antiemetics MDM   Final diagnoses:  Recurrent pancreatitis         Garald Balding, NP 07/21/14 2148

## 2014-07-21 NOTE — ED Notes (Signed)
Pt called out to nurse asking if the PA approved pain meds yet. Informed him I have spoken with the PA multiple times informing him of the pt's concerns. PA stated that he has to discuss pt with the attending to make a decision. Pt does not verbally acknowledge, just rolls his eyes and closes them.

## 2014-07-21 NOTE — ED Notes (Signed)
Nausea also feels better

## 2014-07-21 NOTE — ED Notes (Signed)
Hospitalist at bedside 

## 2014-07-21 NOTE — ED Notes (Signed)
Pt arrived to the ED with a complaint of abdominal pain.  Pain is located upper medial stomach radiating to the back.  Pt has chronic pancreatitis.  Pt states that he has had symptoms for a couple of days.

## 2014-07-21 NOTE — H&P (Addendum)
PCP:   [name], MD   Chief Complaint:  Abdominal pain  HPI: 38 y/o male who   has a past medical history of Chronic pancreatitis (May 2009); Gastric ulcer (2009); Sleep apnea (1999); Chicken pox; Diabetes mellitus; HTN (hypertension); and Hypertriglyceridemia. Today presents to the ED with chief complaint of abdominal pain. Patient says that he has history of recurrent pancreatitis, due to hypertriglyceridemia. Usually his triglyceride level are in thousands, his last triglyceride level was 3000 2 months ago. Patient's lipase level is 20 in the ED, he also had an MRI of the abdomen done in June 2015 which only showed hepatic steatosis. Patient says that he usually knows when he is getting the attack of pancreatitis, and lipase usually do not go up. Is complaining of epigastric pain radiating to back,  9/10 in intensity. He also had nausea vomiting but no blood in the vomitus. He denies diarrhea. No chest pain or shortness of breath.   Allergies:   Allergies  Allergen Reactions  . Codeine Anaphylaxis  . Ivp Dye [Iodinated Diagnostic Agents] Other (See Comments)    Kidneys stop working      Past Medical History  Diagnosis Date  . Chronic pancreatitis May 2009    secondary to hypertirglyceridemia   . Gastric ulcer 2009    by endo  . Sleep apnea 1999    uses CPAP, auto titrates,  Humphrey Rolls)  . Chicken pox   . Diabetes mellitus   . HTN (hypertension)   . Hypertriglyceridemia     severe    Past Surgical History  Procedure Laterality Date  . Tonsillectomy  2003  . Vasectomy    . Lumbar disc surgery  2008    Prior to Admission medications   Medication Sig Start Date End Date Taking? Authorizing Provider  ALPRAZolam Duanne Moron) 0.5 MG tablet Take 0.5 mg by mouth 3 (three) times daily as needed for anxiety.   Yes Historical Provider, MD  aspirin EC 81 MG tablet Take 81 mg by mouth daily.     Yes Historical Provider, MD  carvedilol (COREG) 6.25 MG tablet Take 6.25 mg by mouth 2  (two) times daily with a meal.     Yes Historical Provider, MD  gabapentin (NEURONTIN) 300 MG capsule Take 300 mg by mouth at bedtime.   Yes Historical Provider, MD  gemfibrozil (LOPID) 600 MG tablet Take 600 mg by mouth 2 (two) times daily before a meal.   Yes Historical Provider, MD  lisinopril (PRINIVIL,ZESTRIL) 20 MG tablet Take 20 mg by mouth daily.     Yes Historical Provider, MD  metFORMIN (GLUCOPHAGE) 500 MG tablet Take 500 mg by mouth daily with breakfast.   Yes Historical Provider, MD  omega-3 acid ethyl esters (LOVAZA) 1 G capsule Take 4 capsules (4 g total) by mouth daily. 04/09/14  Yes Crecencio Mc, MD  omeprazole (PRILOSEC) 20 MG capsule Take 20 mg by mouth daily.     Yes Historical Provider, MD  sertraline (ZOLOFT) 100 MG tablet Take 1 tablet (100 mg total) by mouth daily. 05/29/12  Yes Crecencio Mc, MD  UNABLE TO FIND Humulin R 500 U via pump.   Yes Historical Provider, MD  ALPRAZolam Duanne Moron) 0.5 MG tablet Take 1 tablet (0.5 mg total) by mouth at bedtime as needed. 01/22/13   Crecencio Mc, MD    Social History:  reports that he has never smoked. He has never used smokeless tobacco. He reports that he drinks alcohol. He reports that he does  not use illicit drugs.  Family History  Problem Relation Age of Onset  . Adopted: Yes  . Hypertension Mother   . Hyperlipidemia Mother   . Hypertension Father   . Hyperlipidemia Father      All the positives are listed in BOLD  Review of Systems:  HEENT: Headache, blurred vision, runny nose, sore throat Neck: Hypothyroidism, hyperthyroidism,,lymphadenopathy Chest : Shortness of breath, history of COPD, Asthma Heart : Chest pain, history of coronary arterey disease GI:  Nausea, vomiting, diarrhea, constipation, GERD GU: Dysuria, urgency, frequency of urination, hematuria Neuro: Stroke, seizures, syncope Psych: Depression, anxiety, hallucinations   Physical Exam: Blood pressure 129/75, pulse 87, temperature 98.1 F (36.7 C),  temperature source Oral, resp. rate 18, weight 117.935 kg (260 lb), SpO2 96.00%. Constitutional:   Patient is a well-developed and well-nourished male  in no acute distress and cooperative with exam. Head: Normocephalic and atraumatic Mouth: Mucus membranes moist Eyes: PERRL, EOMI, conjunctivae normal Neck: Supple, No Thyromegaly Cardiovascular: RRR, S1 normal, S2 normal Pulmonary/Chest: CTAB, no wheezes, rales, or rhonchi Abdominal: Soft. Positive epigastric tenderness on palpation, non-distended, bowel sounds are normal, no masses, organomegaly, or guarding present.  Neurological: A&O x3, Strenght is normal and symmetric bilaterally, cranial nerve II-XII are grossly intact, no focal motor deficit, sensory intact to light touch bilaterally.  Extremities : No Cyanosis, Clubbing or Edema  Labs on Admission:  Basic Metabolic Panel:  Recent Labs Lab 07/21/14 0232  NA 134*  K 3.9  CL 95*  CO2 20  GLUCOSE 353*  BUN 11  CREATININE 0.76  CALCIUM 8.7   Liver Function Tests:  Recent Labs Lab 07/21/14 0232  AST 18  ALT 15  ALKPHOS 69  BILITOT 0.2*  PROT 8.3  ALBUMIN 4.0    Recent Labs Lab 07/21/14 0232  LIPASE 20   No results found for this basename: AMMONIA,  in the last 168 hours CBC:  Recent Labs Lab 07/21/14 0232  WBC 7.4  HGB 14.5  HCT 38.3*  MCV 77.8*  PLT 263   Cardiac Enzymes: No results found for this basename: CKTOTAL, CKMB, CKMBINDEX, TROPONINI,  in the last 168 hours  BNP (last 3 results) No results found for this basename: PROBNP,  in the last 8760 hours CBG:  Recent Labs Lab 07/21/14 0233 07/21/14 0428 07/21/14 0618  GLUCAP 337* 279* 244*    Radiological Exams on Admission: No results found.     Assessment/Plan Principal Problem:   Recurrent pancreatitis Active Problems:   Sleep apnea   DM (diabetes mellitus), type 2, uncontrolled w/neurologic complication   Hypertriglyceridemia   HTN (hypertension)   Pancreatitis,  acute  Recurrent acute on chronic pancreatitis Likely due to hypertriglyceridemia Liver enzymes are normal Will admit the patient to MedSurg floor start IV normal saline at 125 mL per hour. Dilaudid 1 mg IV every 4 hours when necessary and Zofran 4 mg IV every 6 hours when necessary for nausea and vomiting.  Diabetes mellitus Patient has insulin pump, patient is kept n.p.o. will stop the pump in the hospital, and start lantus 10 units subcut daily. Sliding-scale insulin with NovoLog  Sleep apnea Patient uses CPAP at bedtime. We'll start CPAP each bedtime.  Hypertriglyceridemia Patient has history of hypertriglyceridemia, will obtain fasting lipid profile in a.m.  Hypertension Blood pressure is controlled, continue Coreg and lisinopril  Depression Continue Xanax 0.5 mg 3 times a day when necessary, sertraline 100 mg by mouth daily.  DVT prophylaxis Lovenox  Code status: Patient is full code  Family discussion: No family at bedside  Time Spent on Admission: 60 minutes  [facility] Pager: [phone] 07/21/2014, 10:41 AM  If 7PM-7AM, please contact night-coverage  [email or web address]  Password [email or web address]

## 2014-07-21 NOTE — ED Notes (Signed)
Secretary reports receiving RN is in room with a pt. Took contact info. RN to return call.

## 2014-07-21 NOTE — ED Notes (Signed)
Pt reports he normally gets 7-8 bags of fluid and wants another dose of dilaudid. Pt sts he always gets admitted to [facility] or ICU. Pt informed that his labwork is WNL, that we do not usually give that much fluid in the ER, and that I was informed by previous shift RN that the NP planned on discharging the pt after his 3rd bolus was completed. Pt continues to state that he does not want to be discharged. Joe, PA made aware. PA in to speak with pt.

## 2014-07-22 LAB — COMPREHENSIVE METABOLIC PANEL
ALBUMIN: 3.5 g/dL (ref 3.5–5.2)
ALK PHOS: 59 U/L (ref 39–117)
ALT: 22 U/L (ref 0–53)
AST: 31 U/L (ref 0–37)
Anion gap: 11 (ref 5–15)
BILIRUBIN TOTAL: 0.3 mg/dL (ref 0.3–1.2)
BUN: 9 mg/dL (ref 6–23)
CHLORIDE: 101 meq/L (ref 96–112)
CO2: 25 meq/L (ref 19–32)
Calcium: 8.4 mg/dL (ref 8.4–10.5)
Creatinine, Ser: 0.77 mg/dL (ref 0.50–1.35)
GFR calc Af Amer: >90 mL/min (ref >90)
Glucose, Bld: 211 mg/dL — ABNORMAL HIGH (ref 70–99)
POTASSIUM: 4.4 meq/L (ref 3.7–5.3)
Sodium: 137 mEq/L (ref 137–147)
Total Protein: 7.2 g/dL (ref 6.0–8.3)

## 2014-07-22 LAB — LIPID PANEL
CHOLESTEROL: 641 mg/dL — AB (ref 0–200)
LDL CALC: UNDETERMINED mg/dL (ref 0–99)
Triglycerides: >5000 mg/dL — ABNORMAL HIGH (ref <150)
VLDL: UNDETERMINED mg/dL (ref 0–40)

## 2014-07-22 LAB — GLUCOSE, CAPILLARY
GLUCOSE-CAPILLARY: 152 mg/dL — AB (ref 70–99)
Glucose-Capillary: 132 mg/dL — ABNORMAL HIGH (ref 70–99)
Glucose-Capillary: 152 mg/dL — ABNORMAL HIGH (ref 70–99)
Glucose-Capillary: 153 mg/dL — ABNORMAL HIGH (ref 70–99)
Glucose-Capillary: 188 mg/dL — ABNORMAL HIGH (ref 70–99)

## 2014-07-22 MED ORDER — HYDROMORPHONE HCL 1 MG/ML IJ SOLN
1.0000 mg | INTRAMUSCULAR | Status: DC | PRN
  Administered 2014-07-22 (×2): 2 mg via INTRAVENOUS
  Administered 2014-07-22: 1 mg via INTRAVENOUS
  Administered 2014-07-23 – 2014-07-26 (×17): 2 mg via INTRAVENOUS
  Administered 2014-07-26: 1 mg via INTRAVENOUS
  Administered 2014-07-26 (×2): 2 mg via INTRAVENOUS
  Administered 2014-07-26: 1 mg via INTRAVENOUS
  Administered 2014-07-27 (×2): 2 mg via INTRAVENOUS
  Filled 2014-07-22 (×9): qty 2
  Filled 2014-07-22: qty 1
  Filled 2014-07-22 (×7): qty 2
  Filled 2014-07-22 (×2): qty 1
  Filled 2014-07-22 (×3): qty 2
  Filled 2014-07-22: qty 1
  Filled 2014-07-22 (×4): qty 2
  Filled 2014-07-22: qty 1

## 2014-07-22 MED ORDER — INSULIN PUMP
Freq: Three times a day (TID) | SUBCUTANEOUS | Status: DC
  Administered 2014-07-22 (×3): via SUBCUTANEOUS
  Administered 2014-07-23: 1.1 via SUBCUTANEOUS
  Administered 2014-07-25: 1 via SUBCUTANEOUS
  Administered 2014-07-25: 0.6 via SUBCUTANEOUS
  Administered 2014-07-26: 3.8 via SUBCUTANEOUS
  Administered 2014-07-26: 0.6 via SUBCUTANEOUS
  Administered 2014-07-26: 17:00:00 via SUBCUTANEOUS
  Administered 2014-07-26: 0.5 via SUBCUTANEOUS
  Administered 2014-07-27: 5.2 via SUBCUTANEOUS
  Administered 2014-07-27: 5.8 via SUBCUTANEOUS
  Filled 2014-07-22: qty 1

## 2014-07-22 NOTE — Progress Notes (Signed)
PROGRESS NOTE  Sergio [name] UXN:[id number] DOB: 03/04/1976 DOA: 07/21/2014 PCP: [name], MD  HPI: 38 y/o male who has a past medical history of Chronic pancreatitis (May 2009); Gastric ulcer (2009); Sleep apnea (1999); Chicken pox; Diabetes mellitus; HTN (hypertension); and Hypertriglyceridemia admitted with acute pancreatitis.   Subjective/ 24 H Interval events - better this morning but still with significant epigastric pain  Assessment/Plan: Recurrent acute on chronic pancreatitis  - Likely due to hypertriglyceridemia  - continue insulin, on Pump with U500, if pump malfunctions needs insulin gtt - continue supportive treatment - apparently did not tolerate statins in the past Diabetes mellitus - continue pump Sleep apnea Patient uses CPAP at bedtime.  Hypertriglyceridemia - repeat TG tomorrow Hypertension Blood pressure is controlled, continue Coreg and lisinopril  Depression Continue Xanax 0.5 mg 3 times a day when necessary, sertraline 100 mg by mouth daily.   Diet: NPO Fluids: NS 125 cc/h DVT Prophylaxis: Lovenox  Code Status: Full Family Communication: d/w wife bedside  Disposition Plan: inpatient, home when ready   Consultants:  None   Procedures:  None    Antibiotics None    Studies  Filed Vitals:   07/21/14 1430 07/21/14 2132 07/21/14 2248 07/22/14 0525  BP:  110/66  111/73  Pulse:  83 79 75  Temp:  98.2 F (36.8 C)  97.5 F (36.4 C)  TempSrc:  Oral  Oral  Resp:  18 16 18   Height: 6' (1.829 m)     Weight: 117.935 kg (260 lb)     SpO2:  94% 97% 92%   No intake or output data in the 24 hours ending 07/22/14 1400 Filed Weights   07/21/14 0142 07/21/14 1430  Weight: 117.935 kg (260 lb) 117.935 kg (260 lb)    Exam:  General:  NAD  Cardiovascular: RRR  Respiratory: CTA biL  Abdomen: tender to palpation in epigastric area  MSK: no edema  Data Reviewed: Basic Metabolic Panel:  Recent Labs Lab 07/21/14 0232 07/22/14 0525  NA  134* 137  K 3.9 4.4  CL 95* 101  CO2 20 25  GLUCOSE 353* 211*  BUN 11 9  CREATININE 0.76 0.77  CALCIUM 8.7 8.4   Liver Function Tests:  Recent Labs Lab 07/21/14 0232 07/22/14 0525  AST 18 31  ALT 15 22  ALKPHOS 69 59  BILITOT 0.2* 0.3  PROT 8.3 7.2  ALBUMIN 4.0 3.5    Recent Labs Lab 07/21/14 0232  LIPASE 20   CBC:  Recent Labs Lab 07/21/14 0232 07/22/14 0525  WBC 7.4 5.1  HGB 14.5 12.0*  HCT 38.3* 36.5*  MCV 77.8* 80.0  PLT 263 194   CBG:  Recent Labs Lab 07/21/14 1252 07/21/14 1658 07/21/14 2130 07/22/14 0733 07/22/14 1231  GLUCAP 207* 167* 149* 188* 152*   Studies: No results found.  No results found.  Scheduled Meds: . aspirin EC  81 mg Oral Daily  . carvedilol  6.25 mg Oral BID WC  . enoxaparin (LOVENOX) injection  60 mg Subcutaneous Q24H  . gabapentin  300 mg Oral QHS  . gemfibrozil  600 mg Oral BID AC  . insulin pump   Subcutaneous TID AC, HS, 0200  . lisinopril  20 mg Oral Daily  . omega-3 acid ethyl esters  4 g Oral Daily  . pantoprazole  40 mg Oral Daily  . sertraline  100 mg Oral Daily   Continuous Infusions: . sodium chloride 125 mL/hr at 07/22/14 1206    Principal Problem:  Recurrent pancreatitis Active Problems:   Sleep apnea   DM (diabetes mellitus), type 2, uncontrolled w/neurologic complication   Hypertriglyceridemia   HTN (hypertension)   Pancreatitis, acute   Time spent: [place], MD [facility] Pager [phone]. If 7 PM - 7 AM, please contact night-coverage at [email or web address], password [facility] 07/22/2014, 2:00 PM  LOS: 1 day

## 2014-07-22 NOTE — Progress Notes (Signed)
RT checked with patient about his home CPAP. Patient stated he was fine. Patient is tolerating well. RT will assess and monitor as needed.

## 2014-07-22 NOTE — Care Management Note (Addendum)
    Page 1 of 1   07/22/2014     3:29:34 PM CARE MANAGEMENT NOTE 07/22/2014  Patient:  Sergio [name], Sergio [name]   Account Number:  [id number]  Date Initiated:  07/22/2014  Documentation initiated by:  Dessa Phi  Subjective/Objective Assessment:   38 Y/O M ADMITTED W/ABD PAIN.OI:ZTIWPYK PANCREATITIS.     Action/Plan:   FROM HOME.HAS CPAP,GLUCOMETER.HAS PCP, PHARMACY.   Anticipated DC Date:  07/25/2014   Anticipated DC Plan:  [place]  CM consult      Choice offered to / List presented to:             Status of service:  In process, will continue to follow Medicare Important Message given?   (If response is "NO", the following Medicare IM given date fields will be blank) Date Medicare IM given:   Medicare IM given by:   Date Additional Medicare IM given:   Additional Medicare IM given by:    Discharge Disposition:    Per UR Regulation:  Reviewed for med. necessity/level of care/duration of stay  If discussed at [place] of Stay Meetings, dates discussed:    Comments:  07/22/14 Arlester Keehan RN,BSN NCM [phone] TC ADMIT-CONFIRMED [email or web address] ANTICIPATED D/C NEEDS.

## 2014-07-22 NOTE — Progress Notes (Signed)
Inpatient Diabetes Program Recommendations  AACE/ADA: New Consensus Statement on Inpatient Glycemic Control (2013)  Target Ranges:  Prepandial:   less than 140 mg/dL      Peak postprandial:   less than 180 mg/dL (1-2 hours)      Critically ill patients:  140 - 180 mg/dL   Reason for Visit: Consult regarding insulin pump  Diabetes history: Type 2 diabetes Outpatient Diabetes medications: U500 insulin delivered via Insulin pump Current orders for Inpatient glycemic control: Insulin pump managed by patient  Note:  Patient receptive to my visit.  Insulin pump settings are as follows: Basal rates:   12 midnight to 0700 = 0.425 u/hr    0700 to 2200 = 0.475    2200 to 2400 = 0.425  Insulin to CHO ratio = 1:13  Sensitivity factor = 52  Patient states he is due to change set today.  Per patient and wife has plenty of pump supplies at bedside and plenty of insulin in pump.  They realize that when patient needs to refill pump, will need to have U 500 insulin from home since U 500 is not stocked in the hospital.  Informed patient of the supportive role of the Inpatient Diabetes Department and suggested he request a Diabetes Coordinator by paged between 8 am and 10 pm if he has any problems or concern with his glycemic control.    If for some reason insulin pump needs to be removed from patient, MD needs to remember that patient's insulin is five times the concentration of hospital-stocked insulins.  The GlucoStabilizer would be an effective temporary alternative to insulin pump therapy.   Thank you.  Lexani Corona S. Marcelline Mates, RN, CNS, CDE Inpatient Diabetes Program, team pager [phone]

## 2014-07-22 NOTE — ED Provider Notes (Signed)
Medical screening examination/treatment/procedure(s) were performed by non-physician practitioner and as supervising physician I was immediately available for consultation/collaboration.   EKG Interpretation None        Merryl Hacker, MD 07/22/14 1941

## 2014-07-23 ENCOUNTER — Inpatient Hospital Stay (HOSPITAL_COMMUNITY): Payer: BC Managed Care – PPO

## 2014-07-23 LAB — CBC
HEMATOCRIT: 36.5 % — AB (ref 39.0–52.0)
HEMATOCRIT: 37 % — AB (ref 39.0–52.0)
Hemoglobin: 12 g/dL — ABNORMAL LOW (ref 13.0–17.0)
Hemoglobin: 12.1 g/dL — ABNORMAL LOW (ref 13.0–17.0)
MCH: 26.3 pg (ref 26.0–34.0)
MCH: 25.7 pg — AB (ref 26.0–34.0)
MCHC: 32.9 g/dL (ref 30.0–36.0)
MCHC: 32.7 g/dL (ref 30.0–36.0)
MCV: 80 fL (ref 78.0–100.0)
MCV: 78.7 fL (ref 78.0–100.0)
PLATELETS: 194 10*3/uL (ref 150–400)
Platelets: 174 10*3/uL (ref 150–400)
RBC: 4.56 MIL/uL (ref 4.22–5.81)
RBC: 4.7 MIL/uL (ref 4.22–5.81)
RDW: 16.1 % — ABNORMAL HIGH (ref 11.5–15.5)
RDW: 15.6 % — ABNORMAL HIGH (ref 11.5–15.5)
WBC: 5.1 10*3/uL (ref 4.0–10.5)
WBC: 5.4 10*3/uL (ref 4.0–10.5)

## 2014-07-23 LAB — GLUCOSE, CAPILLARY
GLUCOSE-CAPILLARY: 148 mg/dL — AB (ref 70–99)
GLUCOSE-CAPILLARY: 133 mg/dL — AB (ref 70–99)
Glucose-Capillary: 168 mg/dL — ABNORMAL HIGH (ref 70–99)
Glucose-Capillary: 136 mg/dL — ABNORMAL HIGH (ref 70–99)
Glucose-Capillary: 131 mg/dL — ABNORMAL HIGH (ref 70–99)

## 2014-07-23 LAB — LIPASE, BLOOD: Lipase: 24 U/L (ref 11–59)

## 2014-07-23 LAB — TRIGLYCERIDES: TRIGLYCERIDES: 4478 mg/dL — AB (ref <150)

## 2014-07-23 NOTE — Progress Notes (Signed)
Patient self administers CPAP. Patient has own machine, tubing, and mask.

## 2014-07-23 NOTE — Progress Notes (Signed)
Inpatient Diabetes Program Recommendations  AACE/ADA: New Consensus Statement on Inpatient Glycemic Control (2013)  Target Ranges:  Prepandial:   less than 140 mg/dL      Peak postprandial:   less than 180 mg/dL (1-2 hours)      Critically ill patients:  140 - 180 mg/dL   Reason for Visit: Insulin pump support  Note:  Visited patient in room.  Denies any pump-related issues.   Thank you.  Khila Papp S. Marcelline Mates, RN, CNS, CDE Inpatient Diabetes Program, team pager [phone]

## 2014-07-23 NOTE — ED Provider Notes (Signed)
Medical screening examination/treatment/procedure(s) were conducted as a shared visit with non-physician practitioner(s) and myself.  I personally evaluated the patient during the encounter.   EKG Interpretation None     Patient with acute on chronic abdominal pain. Likely pancreatitis with hyperglycemia. Lipase is not elevated, but has had previous workup and previous pancreatitis. Likely will raise soon. Will admit.  Jasper Riling. Alvino Chapel, [place] 07/23/14 [phone]

## 2014-07-23 NOTE — Progress Notes (Signed)
PROGRESS NOTE  Sergio [name] EXB:[id number] DOB: October 31, 1975 DOA: 07/21/2014 PCP: [name], MD  HPI: 38 y/o male who has a past medical history of Chronic pancreatitis (May 2009); Gastric ulcer (2009); Sleep apnea (1999); Chicken pox; Diabetes mellitus; HTN (hypertension); and Hypertriglyceridemia admitted with acute pancreatitis due to significant hypertriglyceridemia.   Subjective/ 24 H Interval events - better this morning but still with significant epigastric pain - states not ready to eat today as he is afraid  Assessment/Plan: Recurrent acute on chronic pancreatitis  - Likely due to hypertriglyceridemia  - continuous insulin, on Pump with U500, if pump malfunctions needs insulin gtt - continue supportive treatment - apparently did not tolerate statins in the past - TG improving to 4478 this morning Diabetes mellitus - continue pump Sleep apnea Patient uses CPAP at bedtime.  Hypertriglyceridemia - repeat TG tomorrow Hypertension Blood pressure is controlled, continue Coreg and lisinopril  Depression Continue Xanax 0.5 mg 3 times a day when necessary, sertraline 100 mg by mouth daily.   Diet: NPO Fluids: NS 125 cc/h DVT Prophylaxis: Lovenox  Code Status: Full Family Communication: none this morning  Disposition Plan: inpatient, home when ready   Consultants:  None   Procedures:  None    Antibiotics None    Studies  Filed Vitals:   07/22/14 1500 07/22/14 2145 07/23/14 0605 07/23/14 1500  BP: 123/69 116/59 120/66 122/82  Pulse: 81 78 65 69  Temp: 98.7 F (37.1 C) 99.1 F (37.3 C) 98.5 F (36.9 C) 98.1 F (36.7 C)  TempSrc: Oral Oral Oral Oral  Resp: 18 18 18 18   Height:      Weight:      SpO2: 92% 90% 92% 95%    Intake/Output Summary (Last 24 hours) at 07/23/14 1624 Last data filed at 07/23/14 1000  Gross per 24 hour  Intake 2362.5 ml  Output    700 ml  Net 1662.5 ml   Filed Weights   07/21/14 0142 07/21/14 1430  Weight: 117.935 kg (260  lb) 117.935 kg (260 lb)   Exam:  General:  NAD  Cardiovascular: RRR  Respiratory: CTA biL  Abdomen: tender to palpation in epigastric area  MSK: no edema  Data Reviewed: Basic Metabolic Panel:  Recent Labs Lab 07/21/14 0232 07/22/14 0525 07/23/14 0534  NA 134* 137 135*  K 3.9 4.4 5.6*  CL 95* 101 98  CO2 20 25 22   GLUCOSE 353* 211* 176*  BUN 11 9 7   CREATININE 0.76 0.77 0.65  CALCIUM 8.7 8.4 8.4   Liver Function Tests:  Recent Labs Lab 07/21/14 0232 07/22/14 0525 07/23/14 0534  AST 18 31 84*  ALT 15 22 <5  ALKPHOS 69 59 48  BILITOT 0.2* 0.3 0.4  PROT 8.3 7.2 7.7  ALBUMIN 4.0 3.5 3.5    Recent Labs Lab 07/21/14 0232 07/23/14 0534  LIPASE 20 24   CBC:  Recent Labs Lab 07/21/14 0232 07/22/14 0525 07/23/14 0534  WBC 7.4 5.1 5.4  HGB 14.5 12.0* 12.1*  HCT 38.3* 36.5* 37.0*  MCV 77.8* 80.0 78.7  PLT 263 194 174   CBG:  Recent Labs Lab 07/22/14 2012 07/22/14 2342 07/23/14 0408 07/23/14 0758 07/23/14 1206  GLUCAP 132* 152* 148* 168* 133*   Studies: No results found.  No results found.  Scheduled Meds: . aspirin EC  81 mg Oral Daily  . carvedilol  6.25 mg Oral BID WC  . enoxaparin (LOVENOX) injection  60 mg Subcutaneous Q24H  . gabapentin  300 mg Oral  QHS  . gemfibrozil  600 mg Oral BID AC  . insulin pump   Subcutaneous TID AC, HS, 0200  . lisinopril  20 mg Oral Daily  . omega-3 acid ethyl esters  4 g Oral Daily  . pantoprazole  40 mg Oral Daily  . sertraline  100 mg Oral Daily   Continuous Infusions: . sodium chloride 125 mL/hr at 07/23/14 1202   Principal Problem:   Recurrent pancreatitis Active Problems:   Sleep apnea   DM (diabetes mellitus), type 2, uncontrolled w/neurologic complication   Hypertriglyceridemia   HTN (hypertension)   Pancreatitis, acute  Time spent: [place], MD [facility] Pager [phone]. If 7 PM - 7 AM, please contact night-coverage at [email or web address], password [facility] 07/23/2014,  4:24 PM  LOS: 2 days

## 2014-07-24 ENCOUNTER — Telehealth: Payer: Self-pay | Admitting: Internal Medicine

## 2014-07-24 ENCOUNTER — Inpatient Hospital Stay (HOSPITAL_COMMUNITY): Payer: BC Managed Care – PPO

## 2014-07-24 LAB — COMPREHENSIVE METABOLIC PANEL
AST: 84 U/L — ABNORMAL HIGH (ref 0–37)
Albumin: 3.5 g/dL (ref 3.5–5.2)
Alkaline Phosphatase: 48 U/L (ref 39–117)
Anion gap: 15 (ref 5–15)
BILIRUBIN TOTAL: 0.4 mg/dL (ref 0.3–1.2)
BUN: 7 mg/dL (ref 6–23)
CALCIUM: 8.4 mg/dL (ref 8.4–10.5)
CO2: 22 mEq/L (ref 19–32)
Chloride: 98 mEq/L (ref 96–112)
Creatinine, Ser: 0.65 mg/dL (ref 0.50–1.35)
GFR calc non Af Amer: >90 mL/min (ref >90)
GLUCOSE: 176 mg/dL — AB (ref 70–99)
Potassium: 5.6 mEq/L — ABNORMAL HIGH (ref 3.7–5.3)
Sodium: 135 mEq/L — ABNORMAL LOW (ref 137–147)
Total Protein: 7.7 g/dL (ref 6.0–8.3)

## 2014-07-24 LAB — BASIC METABOLIC PANEL
Anion gap: 15 (ref 5–15)
BUN: 7 mg/dL (ref 6–23)
CHLORIDE: 100 meq/L (ref 96–112)
CO2: 23 mEq/L (ref 19–32)
CREATININE: 0.72 mg/dL (ref 0.50–1.35)
Calcium: 8.6 mg/dL (ref 8.4–10.5)
GFR calc Af Amer: >90 mL/min (ref >90)
GFR calc non Af Amer: >90 mL/min (ref >90)
GLUCOSE: 110 mg/dL — AB (ref 70–99)
Potassium: 4.4 mEq/L (ref 3.7–5.3)
Sodium: 138 mEq/L (ref 137–147)

## 2014-07-24 LAB — GLUCOSE, CAPILLARY
GLUCOSE-CAPILLARY: 91 mg/dL (ref 70–99)
Glucose-Capillary: 106 mg/dL — ABNORMAL HIGH (ref 70–99)
Glucose-Capillary: 118 mg/dL — ABNORMAL HIGH (ref 70–99)
Glucose-Capillary: 138 mg/dL — ABNORMAL HIGH (ref 70–99)
Glucose-Capillary: 82 mg/dL (ref 70–99)
Glucose-Capillary: 95 mg/dL (ref 70–99)
Glucose-Capillary: 98 mg/dL (ref 70–99)

## 2014-07-24 LAB — CBC
HEMATOCRIT: 35 % — AB (ref 39.0–52.0)
HEMOGLOBIN: 12.3 g/dL — AB (ref 13.0–17.0)
MCH: 28 pg (ref 26.0–34.0)
MCHC: 35.1 g/dL (ref 30.0–36.0)
MCV: 79.5 fL (ref 78.0–100.0)
Platelets: 192 10*3/uL (ref 150–400)
RBC: 4.4 MIL/uL (ref 4.22–5.81)
RDW: 16 % — ABNORMAL HIGH (ref 11.5–15.5)
WBC: 4.6 10*3/uL (ref 4.0–10.5)

## 2014-07-24 LAB — TRIGLYCERIDES: Triglycerides: 3327 mg/dL — ABNORMAL HIGH (ref <150)

## 2014-07-24 LAB — POTASSIUM: Potassium: 4.5 mEq/L (ref 3.7–5.3)

## 2014-07-24 MED ORDER — COLESEVELAM HCL 625 MG PO TABS
625.0000 mg | ORAL_TABLET | Freq: Two times a day (BID) | ORAL | Status: DC
  Administered 2014-07-24 – 2014-07-25 (×2): 625 mg via ORAL
  Filled 2014-07-24 (×4): qty 1

## 2014-07-24 NOTE — Telephone Encounter (Signed)
Pharmacy requesting refill on Zoloft and Omeprazole ok to fill?

## 2014-07-24 NOTE — Progress Notes (Signed)
PROGRESS NOTE  Sergio [name]:[id number] DOB: 1976-06-01 DOA: 07/21/2014 PCP: [name], MD  Assessment/Plan: Acute on chronic pancreatitis -Secondary to hypertriglyceridemia -Check abdominal ultrasound -Clinically improving -Plan to advance to clear liquids 07/25/2014 if continues to improve clinically  -continue insulin with insulin pump, U500  diabetes mellitus type 2, uncontrolled  -Hemoglobin A1c  -Continue insulin pump basal rate  Hypertriglyceridemia  -Improving with oral agents  -Continue Lopid--may consider switching to fenofibrate if statin needs to be used in future -continue Lovaza -add Welchol and titrate as tolerated Hypertension  -Controlled  -Continue carvedilol and lisinopril  Depression  -Continue Xanax 0.5 mg 3 times a day when necessary, sertraline 100 mg by mouth daily.    Family Communication:   Pt at beside Disposition Plan:   Home when medically stable       Procedures/Studies: US Abdomen Limited Ruq  07-30-2014   CLINICAL DATA:  Upper abdominal pain. History of chronic pancreatitis  EXAM: US ABDOMEN LIMITED - RIGHT UPPER QUADRANT  COMPARISON:  CT abdomen and pelvis March 17, 1999  FINDINGS: Gallbladder:  Gallbladder is somewhat distended with a mild degree of sludge present. There are no gallstones present. No gallbladder wall thickening or pericholecystic fluid. No sonographic Murphy sign noted.  Common bile duct:  Diameter: 6 mm. There is no intrahepatic or extrahepatic biliary duct dilatation.  Liver:  The liver is enlarged measuring approximately 26 cm in length. There is diffuse increased echogenicity consistent with hepatic steatosis. Areas of relatively decreased echogenicity in the liver appear consistent with fatty sparing. No focal liver lesions are identified beyond what is felt to represent fatty sparing.  Most of the pancreas is obscured by gas. Pancreas cannot be adequately assessed on this study  IMPRESSION: Enlarged liver  with what most likely represents fatty sparing near the gallbladder fossa. Gallbladder distended with a small amount of sludge. No gallstones or gallbladder wall thickening. No biliary duct dilatation. Pancreas could be assessed.   Electronically Signed   By: Lowella Grip M.D.   On: Jul 30, 2014 17:36         Subjective:  patient states abdominal pain is 50% better. Has not had any vomiting in 48 hours. Denies fevers, chills, chest pain, shortness breath, dysuria, hematuria, hematochezia, melena.  Objective: Filed Vitals:   30-Jul-2014 1500 07/30/14 2156 07/24/14 0522 07/24/14 1428  BP: 122/82 130/79 115/76 89/51  Pulse: 69 75 65 67  Temp: 98.1 F (36.7 C) 98.4 F (36.9 C) 97.7 F (36.5 C) 98.3 F (36.8 C)  TempSrc: Oral Oral Oral Oral  Resp: 18 18 18 18   Height:      Weight:      SpO2: 95% 91% 92% 91%    Intake/Output Summary (Last 24 hours) at 07/24/14 1528 Last data filed at 07/24/14 0800  Gross per 24 hour  Intake   3000 ml  Output    900 ml  Net   2100 ml   Weight change:  Exam:   General:  Pt is alert, follows commands appropriately, not in acute distress  HEENT: No icterus, No thrush,  [place]/AT  Cardiovascular: RRR, S1/S2, no rubs, no gallops  Respiratory: CTA bilaterally, no wheezing, no crackles, no rhonchi  Abdomen: Soft/+BS, non tender, non distended, no guarding  Extremities: No edema, No lymphangitis, No petechiae, No rashes, no synovitis  Data Reviewed: Basic Metabolic Panel:  Recent Labs Lab 07/21/14 0232 07/22/14 0525 07/30/14 0534 07/30/2014 1900 07/24/14 0433  NA 134*  137 135*  --  138  K 3.9 4.4 5.6* 4.5 4.4  CL 95* 101 98  --  100  CO2 20 25 22   --  23  GLUCOSE 353* 211* 176*  --  110*  BUN 11 9 7   --  7  CREATININE 0.76 0.77 0.65  --  0.72  CALCIUM 8.7 8.4 8.4  --  8.6   Liver Function Tests:  Recent Labs Lab 07/21/14 0232 07/22/14 0525 07/23/14 0534  AST 18 31 84*  ALT 15 22 <5  ALKPHOS 69 59 48  BILITOT 0.2* 0.3 0.4    PROT 8.3 7.2 7.7  ALBUMIN 4.0 3.5 3.5    Recent Labs Lab 07/21/14 0232 07/23/14 0534  LIPASE 20 24   No results found for this basename: AMMONIA,  in the last 168 hours CBC:  Recent Labs Lab 07/21/14 0232 07/22/14 0525 07/23/14 0534 07/24/14 0433  WBC 7.4 5.1 5.4 4.6  HGB 14.5 12.0* 12.1* 12.3*  HCT 38.3* 36.5* 37.0* 35.0*  MCV 77.8* 80.0 78.7 79.5  PLT 263 194 174 192   Cardiac Enzymes: No results found for this basename: CKTOTAL, CKMB, CKMBINDEX, TROPONINI,  in the last 168 hours BNP: No components found with this basename: POCBNP,  CBG:  Recent Labs Lab 07/23/14 2041 07/23/14 2355 07/24/14 0401 07/24/14 0741 07/24/14 1143  GLUCAP 131* 138* 118* 98 106*    No results found for this or any previous visit (from the past 240 hour(s)).   Scheduled Meds: . aspirin EC  81 mg Oral Daily  . carvedilol  6.25 mg Oral BID WC  . colesevelam  625 mg Oral BID WC  . enoxaparin (LOVENOX) injection  60 mg Subcutaneous Q24H  . gabapentin  300 mg Oral QHS  . gemfibrozil  600 mg Oral BID AC  . insulin pump   Subcutaneous TID AC, HS, 0200  . lisinopril  20 mg Oral Daily  . omega-3 acid ethyl esters  4 g Oral Daily  . pantoprazole  40 mg Oral Daily  . sertraline  100 mg Oral Daily   Continuous Infusions: . sodium chloride 125 mL/hr at 07/24/14 0522     Brylyn Novakovich, DO  [facility] Pager [phone]  If 7PM-7AM, please contact night-coverage [email or web address] Password [email or web address] 07/24/2014, 3:28 PM   LOS: 3 days

## 2014-07-24 NOTE — Telephone Encounter (Signed)
Patient has not had an appt with me in over one  Year so I cannot/ refill meds. He is currently in the hospital and will need to get the rx's from them.

## 2014-07-25 LAB — BASIC METABOLIC PANEL
Anion gap: 17 — ABNORMAL HIGH (ref 5–15)
BUN: 7 mg/dL (ref 6–23)
CHLORIDE: 100 meq/L (ref 96–112)
CO2: 21 mEq/L (ref 19–32)
CREATININE: 0.68 mg/dL (ref 0.50–1.35)
Calcium: 8.4 mg/dL (ref 8.4–10.5)
Glucose, Bld: 89 mg/dL (ref 70–99)
Potassium: 3.5 mEq/L — ABNORMAL LOW (ref 3.7–5.3)
Sodium: 138 mEq/L (ref 137–147)

## 2014-07-25 LAB — GLUCOSE, CAPILLARY
GLUCOSE-CAPILLARY: 79 mg/dL (ref 70–99)
GLUCOSE-CAPILLARY: 144 mg/dL — AB (ref 70–99)
Glucose-Capillary: 102 mg/dL — ABNORMAL HIGH (ref 70–99)
Glucose-Capillary: 81 mg/dL (ref 70–99)
Glucose-Capillary: 83 mg/dL (ref 70–99)

## 2014-07-25 LAB — TRIGLYCERIDES: Triglycerides: 2676 mg/dL — ABNORMAL HIGH (ref <150)

## 2014-07-25 MED ORDER — COLESEVELAM HCL 625 MG PO TABS
1250.0000 mg | ORAL_TABLET | Freq: Two times a day (BID) | ORAL | Status: DC
  Administered 2014-07-25 – 2014-07-27 (×4): 1250 mg via ORAL
  Filled 2014-07-25 (×6): qty 2

## 2014-07-25 NOTE — Telephone Encounter (Signed)
Pharmacy Notified

## 2014-07-25 NOTE — Progress Notes (Signed)
PROGRESS NOTE  Sergio [name] MCN:[id number] DOB: [birth year] DOA: 07/21/2014 PCP: [name], MD  Assessment/Plan: Acute on chronic pancreatitis  -Secondary to hypertriglyceridemia  -Check abdominal ultrasound--negative for cholelithiasis or cholecystitis  -Clinically improving  -Plan to advance to clear liquids  -continue insulin with insulin pump, U500  diabetes mellitus type 2, uncontrolled  -Hemoglobin A1c  -Continue insulin pump basal rate  Hypertriglyceridemia  -Improving with oral agents  -Continue Lopid--may consider switching to fenofibrate if statin needs to be used in future  -continue Lovaza  -titrate Welchol up to 1250mg  bid Hypertension  -Controlled  -Continue carvedilol and lisinopril  Depression  -Continue Xanax 0.5 mg 3 times a day when necessary, sertraline 100 mg by mouth daily.  Family Communication: Mother updated at beside  Disposition Plan: Home when medically stable          Procedures/Studies: US Abdomen Complete  07/24/2014   CLINICAL DATA:  Acute pancreatitis  EXAM: ULTRASOUND ABDOMEN COMPLETE  COMPARISON:  None.  FINDINGS: Gallbladder: No gallstones or wall thickening visualized. No sonographic Murphy sign noted.  Common bile duct: Diameter: 5.7 mm  Liver: No focal lesion identified. Heterogeneous, increased hepatic parenchymal echogenicity.  IVC: Not visualized secondary to overlying bowel gas.  Pancreas: Suboptimally evaluated secondary to overlying bowel gas.  Spleen: Size and appearance within normal limits.  Right Kidney: Length: 14.8 cm. Echogenicity within normal limits. No mass or hydronephrosis visualized.  Left Kidney: Length: 14.6 cm. Echogenicity within normal limits. No mass or hydronephrosis visualized.  Abdominal aorta: No aneurysm visualized.  Other findings: None.  IMPRESSION: 1. Hepatic steatosis. 2. No cholelithiasis. 3. Suboptimally evaluated pancreas secondary to overlying bowel gas. 4.   Electronically Signed   By:  Kathreen Devoid   On: 07/24/2014 21:28   US Abdomen Limited Ruq  07/23/2014   CLINICAL DATA:  Upper abdominal pain. History of chronic pancreatitis  EXAM: US ABDOMEN LIMITED - RIGHT UPPER QUADRANT  COMPARISON:  CT abdomen and pelvis March 17, 1999  FINDINGS: Gallbladder:  Gallbladder is somewhat distended with a mild degree of sludge present. There are no gallstones present. No gallbladder wall thickening or pericholecystic fluid. No sonographic Murphy sign noted.  Common bile duct:  Diameter: 6 mm. There is no intrahepatic or extrahepatic biliary duct dilatation.  Liver:  The liver is enlarged measuring approximately 26 cm in length. There is diffuse increased echogenicity consistent with hepatic steatosis. Areas of relatively decreased echogenicity in the liver appear consistent with fatty sparing. No focal liver lesions are identified beyond what is felt to represent fatty sparing.  Most of the pancreas is obscured by gas. Pancreas cannot be adequately assessed on this study  IMPRESSION: Enlarged liver with what most likely represents fatty sparing near the gallbladder fossa. Gallbladder distended with a small amount of sludge. No gallstones or gallbladder wall thickening. No biliary duct dilatation. Pancreas could be assessed.   Electronically Signed   By: Lowella Grip M.D.   On: 07/23/2014 17:36         Subjective: Patient continues to feel better. He still has some mild abdominal pain and epigastric area. Denies any fevers, chills, chest pain, shortness breath, nausea, vomiting, diarrhea, dysuria. He feels hungry.  Objective: Filed Vitals:   07/24/14 1844 07/24/14 2155 07/25/14 0556 07/25/14 0855  BP: 107/75 127/77 127/86 128/82  Pulse:  68 63 72  Temp:  98.4 F (36.9 C) 97.7 F (36.5 C)   TempSrc:  Oral Oral  Resp:  18 20   Height:      Weight:      SpO2:  95% 94%     Intake/Output Summary (Last 24 hours) at 07/25/14 1153 Last data filed at 07/25/14 0900  Gross per 24 hour    Intake   2750 ml  Output   4275 ml  Net  -1525 ml   Weight change:  Exam:   General:  Pt is alert, follows commands appropriately, not in acute distress  HEENT: No icterus, No thrush, [place]/AT  Cardiovascular: RRR, S1/S2, no rubs, no gallops  Respiratory: CTA bilaterally, no wheezing, no crackles, no rhonchi  Abdomen: Soft/+BS, non tender, non distended, no guarding  Extremities: No edema, No lymphangitis, No petechiae, No rashes, no synovitis  Data Reviewed: Basic Metabolic Panel:  Recent Labs Lab 07/21/14 0232 07/22/14 0525 07/23/14 0534 07/23/14 1900 07/24/14 0433 07/25/14 0445  NA 134* 137 135*  --  138 138  K 3.9 4.4 5.6* 4.5 4.4 3.5*  CL 95* 101 98  --  100 100  CO2 20 25 22   --  23 21  GLUCOSE 353* 211* 176*  --  110* 89  BUN 11 9 7   --  7 7  CREATININE 0.76 0.77 0.65  --  0.72 0.68  CALCIUM 8.7 8.4 8.4  --  8.6 8.4   Liver Function Tests:  Recent Labs Lab 07/21/14 0232 07/22/14 0525 07/23/14 0534  AST 18 31 84*  ALT 15 22 <5  ALKPHOS 69 59 48  BILITOT 0.2* 0.3 0.4  PROT 8.3 7.2 7.7  ALBUMIN 4.0 3.5 3.5    Recent Labs Lab 07/21/14 0232 07/23/14 0534  LIPASE 20 24   No results found for this basename: AMMONIA,  in the last 168 hours CBC:  Recent Labs Lab 07/21/14 0232 07/22/14 0525 07/23/14 0534 07/24/14 0433  WBC 7.4 5.1 5.4 4.6  HGB 14.5 12.0* 12.1* 12.3*  HCT 38.3* 36.5* 37.0* 35.0*  MCV 77.8* 80.0 78.7 79.5  PLT 263 194 174 192   Cardiac Enzymes: No results found for this basename: CKTOTAL, CKMB, CKMBINDEX, TROPONINI,  in the last 168 hours BNP: No components found with this basename: POCBNP,  CBG:  Recent Labs Lab 07/24/14 1625 07/24/14 1948 07/24/14 2333 07/25/14 0433 07/25/14 0742  GLUCAP 82 95 91 102* 81    No results found for this or any previous visit (from the past 240 hour(s)).   Scheduled Meds: . aspirin EC  81 mg Oral Daily  . carvedilol  6.25 mg Oral BID WC  . colesevelam  1,250 mg Oral BID WC  .  enoxaparin (LOVENOX) injection  60 mg Subcutaneous Q24H  . gabapentin  300 mg Oral QHS  . gemfibrozil  600 mg Oral BID AC  . insulin pump   Subcutaneous TID AC, HS, 0200  . lisinopril  20 mg Oral Daily  . omega-3 acid ethyl esters  4 g Oral Daily  . pantoprazole  40 mg Oral Daily  . sertraline  100 mg Oral Daily   Continuous Infusions: . sodium chloride 125 mL/hr at 07/24/14 0522     Edia Pursifull, DO  [facility] Pager [phone]  If 7PM-7AM, please contact night-coverage [email or web address] Password [email or web address] 07/25/2014, 11:53 AM   LOS: 4 days

## 2014-07-26 LAB — GLUCOSE, CAPILLARY
GLUCOSE-CAPILLARY: 140 mg/dL — AB (ref 70–99)
GLUCOSE-CAPILLARY: 155 mg/dL — AB (ref 70–99)
GLUCOSE-CAPILLARY: 181 mg/dL — AB (ref 70–99)
Glucose-Capillary: 177 mg/dL — ABNORMAL HIGH (ref 70–99)

## 2014-07-26 LAB — BASIC METABOLIC PANEL
ANION GAP: 17 — AB (ref 5–15)
BUN: 5 mg/dL — ABNORMAL LOW (ref 6–23)
CHLORIDE: 100 meq/L (ref 96–112)
CO2: 22 meq/L (ref 19–32)
CREATININE: 0.79 mg/dL (ref 0.50–1.35)
Calcium: 9.1 mg/dL (ref 8.4–10.5)
GFR calc Af Amer: >90 mL/min (ref >90)
GFR calc non Af Amer: >90 mL/min (ref >90)
Glucose, Bld: 124 mg/dL — ABNORMAL HIGH (ref 70–99)
POTASSIUM: 4 meq/L (ref 3.7–5.3)
Sodium: 139 mEq/L (ref 137–147)

## 2014-07-26 LAB — TRIGLYCERIDES: Triglycerides: 2546 mg/dL — ABNORMAL HIGH (ref <150)

## 2014-07-26 LAB — HEMOGLOBIN A1C
Hgb A1c MFr Bld: 9.6 % — ABNORMAL HIGH (ref <5.7)
Mean Plasma Glucose: 229 mg/dL — ABNORMAL HIGH (ref <117)

## 2014-07-26 NOTE — Progress Notes (Signed)
Pt on home CPAP, machine appears to be in good working order.  Pt stated that he preferred to place himself on CPAP when ready.  The Pt stated he did not need anything for his CPAP (when I asked) and stated he preferred no humidification.  Pt was instructed to contact me should he need anything throughout the night.

## 2014-07-26 NOTE — Progress Notes (Signed)
Inpatient Diabetes Program Recommendations  AACE/ADA: New Consensus Statement on Inpatient Glycemic Control (2013)  Target Ranges:  Prepandial:   less than 140 mg/dL      Peak postprandial:   less than 180 mg/dL (1-2 hours)      Critically ill patients:  140 - 180 mg/dL     Results for DAVIONNE, DOWTY (MRN [id number]) as of 07/26/2014 11:40  Ref. Range 07/25/2014 07:42 07/25/2014 11:43 07/25/2014 17:08 07/25/2014 20:45  Glucose-Capillary Latest Range: 70-99 mg/dL 81 79 83 144 (H)     **Patient's CBGs well controlled here in hospital.  Patient is using Insulin pump with U-500 insulin to control CBGs.  **Per patient, patient changed his set/site/insulin on 10/14.  Has extra insulin pump supplies and U-500 insulin with him at bedside.  Remains alert and oriented and able to independently manage his insulin pump.    Will follow Wyn Quaker RN, MSN, CDE Diabetes Coordinator Inpatient Diabetes Program Team Pager: [phone] (8a-10p)

## 2014-07-26 NOTE — Progress Notes (Signed)
PROGRESS NOTE  Sergio [name] BLT:[id number] DOB: [birth year] DOA: 07/21/2014 PCP: [name], MD  Assessment/Plan: Acute on chronic pancreatitis  -Secondary to hypertriglyceridemia  -Check abdominal ultrasound--negative for cholelithiasis or cholecystitis  -Clinically improving  -advance to full liquids  -continue insulin with insulin pump, U500  diabetes mellitus type 2, uncontrolled  -Hemoglobin A1c--9.6 -Continue insulin pump basal rate  Hypertriglyceridemia  -Improving with oral agents  -Continue Lopid--may consider switching to fenofibrate if statin needs to be used in future  -continue Lovaza  -titrate Welchol up to 1250mg  bid  Hypertension  -Controlled  -Continue carvedilol and lisinopril  Depression  -Continue Xanax 0.5 mg 3 times a day when necessary, sertraline 100 mg by mouth daily.  Family Communication: Mother updated at beside  Disposition Plan: Home when medically stable        Procedures/Studies: US Abdomen Complete  07/24/2014   CLINICAL DATA:  Acute pancreatitis  EXAM: ULTRASOUND ABDOMEN COMPLETE  COMPARISON:  None.  FINDINGS: Gallbladder: No gallstones or wall thickening visualized. No sonographic Murphy sign noted.  Common bile duct: Diameter: 5.7 mm  Liver: No focal lesion identified. Heterogeneous, increased hepatic parenchymal echogenicity.  IVC: Not visualized secondary to overlying bowel gas.  Pancreas: Suboptimally evaluated secondary to overlying bowel gas.  Spleen: Size and appearance within normal limits.  Right Kidney: Length: 14.8 cm. Echogenicity within normal limits. No mass or hydronephrosis visualized.  Left Kidney: Length: 14.6 cm. Echogenicity within normal limits. No mass or hydronephrosis visualized.  Abdominal aorta: No aneurysm visualized.  Other findings: None.  IMPRESSION: 1. Hepatic steatosis. 2. No cholelithiasis. 3. Suboptimally evaluated pancreas secondary to overlying bowel gas. 4.   Electronically Signed   By: Kathreen Devoid   On: 07/24/2014 21:28   US Abdomen Limited Ruq  07/23/2014   CLINICAL DATA:  Upper abdominal pain. History of chronic pancreatitis  EXAM: US ABDOMEN LIMITED - RIGHT UPPER QUADRANT  COMPARISON:  CT abdomen and pelvis March 17, 1999  FINDINGS: Gallbladder:  Gallbladder is somewhat distended with a mild degree of sludge present. There are no gallstones present. No gallbladder wall thickening or pericholecystic fluid. No sonographic Murphy sign noted.  Common bile duct:  Diameter: 6 mm. There is no intrahepatic or extrahepatic biliary duct dilatation.  Liver:  The liver is enlarged measuring approximately 26 cm in length. There is diffuse increased echogenicity consistent with hepatic steatosis. Areas of relatively decreased echogenicity in the liver appear consistent with fatty sparing. No focal liver lesions are identified beyond what is felt to represent fatty sparing.  Most of the pancreas is obscured by gas. Pancreas cannot be adequately assessed on this study  IMPRESSION: Enlarged liver with what most likely represents fatty sparing near the gallbladder fossa. Gallbladder distended with a small amount of sludge. No gallstones or gallbladder wall thickening. No biliary duct dilatation. Pancreas could be assessed.   Electronically Signed   By: Lowella Grip M.D.   On: 07/23/2014 17:36         Subjective: Patient says that abdominal pain continues to improve. He denies any nausea, vomiting, diarrhea. Denies any fevers, chills, chest pain, shortness breath.  Objective: Filed Vitals:   07/25/14 1415 07/25/14 2153 07/26/14 0525 07/26/14 1400  BP: 108/64 124/78 135/88 125/83  Pulse: 65 61 62 64  Temp: 98.2 F (36.8 C) 98.7 F (37.1 C) 98.8 F (37.1 C) 98.5 F (36.9 C)  TempSrc: Axillary Oral Oral Oral  Resp: 20 18 18  18  Height:      Weight:      SpO2: 97% 96% 97% 97%    Intake/Output Summary (Last 24 hours) at 07/26/14 1815 Last data filed at 07/26/14 1400  Gross per 24 hour    Intake 2106.67 ml  Output    900 ml  Net 1206.67 ml   Weight change:  Exam:   General:  Pt is alert, follows commands appropriately, not in acute distress  HEENT: No icterus, No thrush, /AT  Cardiovascular: RRR, S1/S2, no rubs, no gallops  Respiratory: CTA bilaterally, no wheezing, no crackles, no rhonchi  Abdomen: Soft/+BS, epigastric tenderness without any peritoneal signs, non distended, no guarding  Extremities: No edema, No lymphangitis, No petechiae, No rashes, no synovitis  Data Reviewed: Basic Metabolic Panel:  Recent Labs Lab 07/22/14 0525 07/23/14 0534 07/23/14 1900 07/24/14 0433 07/25/14 0445 07/26/14 0540  NA 137 135*  --  138 138 139  K 4.4 5.6* 4.5 4.4 3.5* 4.0  CL 101 98  --  100 100 100  CO2 25 22  --  23 21 22   GLUCOSE 211* 176*  --  110* 89 124*  BUN 9 7  --  7 7 5*  CREATININE 0.77 0.65  --  0.72 0.68 0.79  CALCIUM 8.4 8.4  --  8.6 8.4 9.1   Liver Function Tests:  Recent Labs Lab 07/21/14 0232 07/22/14 0525 07/23/14 0534  AST 18 31 84*  ALT 15 22 <5  ALKPHOS 69 59 48  BILITOT 0.2* 0.3 0.4  PROT 8.3 7.2 7.7  ALBUMIN 4.0 3.5 3.5    Recent Labs Lab 07/21/14 0232 07/23/14 0534  LIPASE 20 24   No results found for this basename: AMMONIA,  in the last 168 hours CBC:  Recent Labs Lab 07/21/14 0232 07/22/14 0525 07/23/14 0534 07/24/14 0433  WBC 7.4 5.1 5.4 4.6  HGB 14.5 12.0* 12.1* 12.3*  HCT 38.3* 36.5* 37.0* 35.0*  MCV 77.8* 80.0 78.7 79.5  PLT 263 194 174 192   Cardiac Enzymes: No results found for this basename: CKTOTAL, CKMB, CKMBINDEX, TROPONINI,  in the last 168 hours BNP: No components found with this basename: POCBNP,  CBG:  Recent Labs Lab 07/25/14 1708 07/25/14 2045 07/26/14 0806 07/26/14 1219 07/26/14 1642  GLUCAP 83 144* 140* 155* 181*    No results found for this or any previous visit (from the past 240 hour(s)).   Scheduled Meds: . aspirin EC  81 mg Oral Daily  . carvedilol  6.25 mg Oral BID  WC  . colesevelam  1,250 mg Oral BID WC  . enoxaparin (LOVENOX) injection  60 mg Subcutaneous Q24H  . gabapentin  300 mg Oral QHS  . gemfibrozil  600 mg Oral BID AC  . insulin pump   Subcutaneous TID AC, HS, 0200  . lisinopril  20 mg Oral Daily  . omega-3 acid ethyl esters  4 g Oral Daily  . pantoprazole  40 mg Oral Daily  . sertraline  100 mg Oral Daily   Continuous Infusions: . sodium chloride 125 mL/hr at 07/25/14 2136     Tkai Large, DO  [facility] Pager [phone]  If 7PM-7AM, please contact night-coverage [email or web address] Password [email or web address] 07/26/2014, 6:15 PM   LOS: 5 days

## 2014-07-26 NOTE — Discharge Summary (Signed)
Physician Discharge Summary  Sergio [name] IPJ:[id number] DOB: [birth year] DOA: 07/21/2014  PCP: [name], MD  Admit date: 07/21/2014 Discharge date: 07/27/14 Recommendations for Outpatient Follow-up:  1. Pt will need to follow up with PCP in 2 weeks post discharge 2. Please obtain BMP, CBC, triglycerides in one week  Discharge Diagnoses:  Acute on chronic pancreatitis  -Secondary to hypertriglyceridemia  -Check abdominal ultrasound--negative for cholelithiasis or cholecystitis  -Clinically improving  -advance to soft diet which pt tolerated -continue insulin with insulin pump, U500  diabetes mellitus type 2, uncontrolled  -Hemoglobin A1c--9.6  -Continue insulin pump basal rate  -As the patient's diet was advanced, the patient was instructed to restart his bolusing regimen -The patient was instructed to keep a glycemic log before each meal and at bedtime with which he will take to his endocrinologist for further adjustment of his insulin regimen- Hypertriglyceridemia  -initial presentation with triglycerides >5000 -triglycerides 2542 on day of d/c, but anticipate that this will continue to decrease -Improving with oral agents  -Continue Lopid- -continue Lovaza  -d/c Welchol -Tried to introduce niacin, but patient stated that he was not able to tolerate this in the past and did not want to try it -This may need to be readdressed in the future if his shortness rest do not continue to improve -advised pt to f/u with PCP to check LFTs in 1 week Hypertension  -Controlled  -Continue carvedilol and lisinopril  Depression  -Continue Xanax 0.5 mg 3 times a day when necessary, sertraline 100 mg by mouth daily.   Discharge Condition: Stable  Disposition:  home  Diet: Carb modified Wt Readings from Last 3 Encounters:  07/21/14 117.935 kg (260 lb)  05/08/14 115.894 kg (255 lb 8 oz)  03/19/13 111.018 kg (244 lb 12 oz)    History of present illness:  38 y/o male who has a  past medical history of Chronic pancreatitis (May 2009); Gastric ulcer (2009); Sleep apnea (1999); Chicken pox; Diabetes mellitus; HTN (hypertension); and Hypertriglyceridemia admitted with acute pancreatitis due to significant hypertriglyceridemia. Abdominal ultrasound was obtained and was negative for cholelithiasis or choledocholithiasis or cholecystitis. Initial triglycerides were >5000. The patient was treated conservatively with bowel rest, IV fluids, and antiemetics. He was continued on his insulin pump. The patient was started on Lopid and Lovaza for his triglycerides.  The patient's triglycerides gradually improved.I tried to introduce niacin during the admission, but the patient stated that he has tried this before and was not able to tolerate it. At some point in the outpatient setting, this may need to be readdressed or reintroduced if his triglycerides do not continue to improve. The patient endorsed dietary indiscretion and intermittent noncompliance with his medications at home. The patient slowly improved. His diet was gradually advanced to a soft diet which he tolerated.     Discharge Exam: Filed Vitals:   07/27/14 0603  BP: 111/80  Pulse: 65  Temp: 97.5 F (36.4 C)  Resp: 18   Filed Vitals:   07/26/14 0525 07/26/14 1400 07/26/14 2104 07/27/14 0603  BP: 135/88 125/83 116/71 111/80  Pulse: 62 64 64 65  Temp: 98.8 F (37.1 C) 98.5 F (36.9 C) 98.5 F (36.9 C) 97.5 F (36.4 C)  TempSrc: Oral Oral Oral Oral  Resp: 18 18 20 18   Height:      Weight:      SpO2: 97% 97% 94% 97%   General: A&O x 3, NAD, pleasant, cooperative Cardiovascular: RRR, no rub, no gallop, no S3 Respiratory: CTAB, no  wheeze, no rhonchi Abdomen:soft, epigastric tenderness without any guarding  nondistended, positive bowel sounds Extremities: No edema, No lymphangitis, no petechiae  Discharge Instructions      Discharge Instructions   Diet - low sodium heart healthy    Complete by:  As directed        Increase activity slowly    Complete by:  As directed             Medication List         ALPRAZolam 0.5 MG tablet  Commonly known as:  XANAX  Take 0.5 mg by mouth 3 (three) times daily as needed for anxiety.     ALPRAZolam 0.5 MG tablet  Commonly known as:  XANAX  Take 1 tablet (0.5 mg total) by mouth at bedtime as needed.     aspirin EC 81 MG tablet  Take 81 mg by mouth daily.     carvedilol 6.25 MG tablet  Commonly known as:  COREG  Take 6.25 mg by mouth 2 (two) times daily with a meal.     gabapentin 300 MG capsule  Commonly known as:  NEURONTIN  Take 300 mg by mouth at bedtime.     gemfibrozil 600 MG tablet  Commonly known as:  LOPID  Take 1 tablet (600 mg total) by mouth 2 (two) times daily before a meal.     lisinopril 20 MG tablet  Commonly known as:  PRINIVIL,ZESTRIL  Take 20 mg by mouth daily.     metFORMIN 500 MG tablet  Commonly known as:  GLUCOPHAGE  Take 500 mg by mouth daily with breakfast.     niacin 500 MG CR tablet  Commonly known as:  NIASPAN  Take 1 tablet (500 mg total) by mouth at bedtime.     omega-3 acid ethyl esters 1 G capsule  Commonly known as:  LOVAZA  Take 4 capsules (4 g total) by mouth daily.     omeprazole 20 MG capsule  Commonly known as:  PRILOSEC  Take 20 mg by mouth daily.     sertraline 100 MG tablet  Commonly known as:  ZOLOFT  Take 1 tablet (100 mg total) by mouth daily.     UNABLE TO FIND  Humulin R 500 U via pump.         The results of significant diagnostics from this hospitalization (including imaging, microbiology, ancillary and laboratory) are listed below for reference.    Significant Diagnostic Studies: US Abdomen Complete  07/24/2014   CLINICAL DATA:  Acute pancreatitis  EXAM: ULTRASOUND ABDOMEN COMPLETE  COMPARISON:  None.  FINDINGS: Gallbladder: No gallstones or wall thickening visualized. No sonographic Murphy sign noted.  Common bile duct: Diameter: 5.7 mm  Liver: No focal lesion  identified. Heterogeneous, increased hepatic parenchymal echogenicity.  IVC: Not visualized secondary to overlying bowel gas.  Pancreas: Suboptimally evaluated secondary to overlying bowel gas.  Spleen: Size and appearance within normal limits.  Right Kidney: Length: 14.8 cm. Echogenicity within normal limits. No mass or hydronephrosis visualized.  Left Kidney: Length: 14.6 cm. Echogenicity within normal limits. No mass or hydronephrosis visualized.  Abdominal aorta: No aneurysm visualized.  Other findings: None.  IMPRESSION: 1. Hepatic steatosis. 2. No cholelithiasis. 3. Suboptimally evaluated pancreas secondary to overlying bowel gas. 4.   Electronically Signed   By: Kathreen Devoid   On: 07/24/2014 21:28   US Abdomen Limited Ruq  07/23/2014   CLINICAL DATA:  Upper abdominal pain. History of chronic pancreatitis  EXAM: US ABDOMEN LIMITED -  RIGHT UPPER QUADRANT  COMPARISON:  CT abdomen and pelvis March 17, 1999  FINDINGS: Gallbladder:  Gallbladder is somewhat distended with a mild degree of sludge present. There are no gallstones present. No gallbladder wall thickening or pericholecystic fluid. No sonographic Murphy sign noted.  Common bile duct:  Diameter: 6 mm. There is no intrahepatic or extrahepatic biliary duct dilatation.  Liver:  The liver is enlarged measuring approximately 26 cm in length. There is diffuse increased echogenicity consistent with hepatic steatosis. Areas of relatively decreased echogenicity in the liver appear consistent with fatty sparing. No focal liver lesions are identified beyond what is felt to represent fatty sparing.  Most of the pancreas is obscured by gas. Pancreas cannot be adequately assessed on this study  IMPRESSION: Enlarged liver with what most likely represents fatty sparing near the gallbladder fossa. Gallbladder distended with a small amount of sludge. No gallstones or gallbladder wall thickening. No biliary duct dilatation. Pancreas could be assessed.   Electronically  Signed   By: Lowella Grip M.D.   On: 07/23/2014 17:36     Microbiology: No results found for this or any previous visit (from the past 240 hour(s)).   Labs: Basic Metabolic Panel:  Recent Labs Lab 07/23/14 0534  07/24/14 0433 07/25/14 0445 07/26/14 0540 07/27/14 0520  NA 135*  --  138 138 139 135*  K 5.6*  < > 4.4 3.5* 4.0 3.6*  CL 98  --  100 100 100 98  CO2 22  --  23 21 22 22   GLUCOSE 176*  --  110* 89 124* 122*  BUN 7  --  7 7 5* 5*  CREATININE 0.65  --  0.72 0.68 0.79 0.75  CALCIUM 8.4  --  8.6 8.4 9.1 8.8  < > = values in this interval not displayed. Liver Function Tests:  Recent Labs Lab 07/21/14 0232 07/22/14 0525 07/23/14 0534  AST 18 31 84*  ALT 15 22 <5  ALKPHOS 69 59 48  BILITOT 0.2* 0.3 0.4  PROT 8.3 7.2 7.7  ALBUMIN 4.0 3.5 3.5    Recent Labs Lab 07/21/14 0232 07/23/14 0534  LIPASE 20 24   No results found for this basename: AMMONIA,  in the last 168 hours CBC:  Recent Labs Lab 07/21/14 0232 07/22/14 0525 07/23/14 0534 07/24/14 0433  WBC 7.4 5.1 5.4 4.6  HGB 14.5 12.0* 12.1* 12.3*  HCT 38.3* 36.5* 37.0* 35.0*  MCV 77.8* 80.0 78.7 79.5  PLT 263 194 174 192   Cardiac Enzymes: No results found for this basename: CKTOTAL, CKMB, CKMBINDEX, TROPONINI,  in the last 168 hours BNP: No components found with this basename: POCBNP,  CBG:  Recent Labs Lab 07/26/14 1219 07/26/14 1642 07/26/14 2101 07/27/14 0808 07/27/14 1202  GLUCAP 155* 181* 177* 146* 183*    Time coordinating discharge:  Greater than 30 minutes  Signed:  Yuritzi Kamp, DO [facility] Pager: [phone] 07/27/2014, 2:16 PM

## 2014-07-27 LAB — GLUCOSE, CAPILLARY
Glucose-Capillary: 146 mg/dL — ABNORMAL HIGH (ref 70–99)
Glucose-Capillary: 183 mg/dL — ABNORMAL HIGH (ref 70–99)

## 2014-07-27 LAB — BASIC METABOLIC PANEL
Anion gap: 15 (ref 5–15)
BUN: 5 mg/dL — ABNORMAL LOW (ref 6–23)
CALCIUM: 8.8 mg/dL (ref 8.4–10.5)
CO2: 22 mEq/L (ref 19–32)
CREATININE: 0.75 mg/dL (ref 0.50–1.35)
Chloride: 98 mEq/L (ref 96–112)
GFR calc Af Amer: >90 mL/min (ref >90)
Glucose, Bld: 122 mg/dL — ABNORMAL HIGH (ref 70–99)
Potassium: 3.6 mEq/L — ABNORMAL LOW (ref 3.7–5.3)
SODIUM: 135 meq/L — AB (ref 137–147)

## 2014-07-27 LAB — TRIGLYCERIDES: Triglycerides: 2542 mg/dL — ABNORMAL HIGH (ref <150)

## 2014-07-27 MED ORDER — FENOFIBRATE 145 MG PO TABS: 145.0000 mg | ORAL_TABLET | Freq: Every day | ORAL | Status: DC

## 2014-07-27 MED ORDER — SERTRALINE HCL 100 MG PO TABS: 100.0000 mg | ORAL_TABLET | Freq: Every day | ORAL | Status: DC

## 2014-07-27 MED ORDER — OMEGA-3-ACID ETHYL ESTERS 1 G PO CAPS: 4.0000 g | ORAL_CAPSULE | Freq: Every day | ORAL | Status: DC

## 2014-07-27 MED ORDER — NIACIN ER (ANTIHYPERLIPIDEMIC) 500 MG PO TBCR: 500.0000 mg | EXTENDED_RELEASE_TABLET | Freq: Every day | ORAL | Status: DC

## 2014-07-27 MED ORDER — NIACIN ER 500 MG PO CPCR
500.0000 mg | ORAL_CAPSULE | Freq: Every day | ORAL | Status: DC
  Filled 2014-07-27: qty 1

## 2014-07-27 MED ORDER — FENOFIBRATE 160 MG PO TABS
160.0000 mg | ORAL_TABLET | Freq: Every day | ORAL | Status: DC
  Administered 2014-07-27: 160 mg via ORAL
  Filled 2014-07-27: qty 1

## 2014-07-27 MED ORDER — GEMFIBROZIL 600 MG PO TABS: 600.0000 mg | ORAL_TABLET | Freq: Two times a day (BID) | ORAL | Status: DC

## 2014-07-27 MED ORDER — COLESEVELAM HCL 625 MG PO TABS: 1875.0000 mg | ORAL_TABLET | Freq: Two times a day (BID) | ORAL | Status: DC

## 2014-09-03 ENCOUNTER — Inpatient Hospital Stay: Payer: Self-pay | Admitting: Internal Medicine

## 2014-09-03 LAB — URINALYSIS, COMPLETE
BACTERIA: NONE SEEN
BLOOD: NEGATIVE
Bilirubin,UR: NEGATIVE
Glucose,UR: >=500 mg/dL (ref 0–75)
Ketone: NEGATIVE
LEUKOCYTE ESTERASE: NEGATIVE
Nitrite: NEGATIVE
PH: 5 (ref 4.5–8.0)
Protein: 100
RBC,UR: NONE SEEN /HPF (ref 0–5)
Specific Gravity: 1.024 (ref 1.003–1.030)
WBC UR: 1 /HPF (ref 0–5)

## 2014-09-03 LAB — LIPASE, BLOOD: Lipase: 307 U/L (ref 73–393)

## 2014-09-03 LAB — CBC
HCT: 37.6 % — ABNORMAL LOW (ref 40.0–52.0)
MCV: 78 fL — ABNORMAL LOW (ref 80–100)
PLATELETS: 277 10*3/uL (ref 150–440)
RBC: 4.8 10*6/uL (ref 4.40–5.90)
RDW: 16.9 % — ABNORMAL HIGH (ref 11.5–14.5)
WBC: 8.7 10*3/uL (ref 3.8–10.6)

## 2014-09-03 LAB — LIPID PANEL
Cholesterol: 362 mg/dL — ABNORMAL HIGH (ref 0–200)
HDL Cholesterol: 18 mg/dL — ABNORMAL LOW (ref 40–60)
Triglycerides: >4000 mg/dL — ABNORMAL HIGH (ref 0–200)

## 2014-09-03 LAB — COMPREHENSIVE METABOLIC PANEL
ALBUMIN: 2.5 g/dL — AB (ref 3.4–5.0)
Anion Gap: 12 (ref 7–16)
BILIRUBIN TOTAL: 1.6 mg/dL — AB (ref 0.2–1.0)
Chloride: 90 mmol/L — ABNORMAL LOW (ref 98–107)
Co2: 23 mmol/L (ref 21–32)
GLUCOSE: 454 mg/dL — AB (ref 65–99)
Potassium: 3.3 mmol/L — ABNORMAL LOW (ref 3.5–5.1)
Sodium: 125 mmol/L — ABNORMAL LOW (ref 136–145)

## 2014-09-03 LAB — TROPONIN I: Troponin-I: <0.02 ng/mL

## 2014-09-04 LAB — COMPREHENSIVE METABOLIC PANEL
Albumin: 2.7 g/dL — ABNORMAL LOW (ref 3.4–5.0)
Alkaline Phosphatase: 58 U/L
Anion Gap: 5 — ABNORMAL LOW (ref 7–16)
Bilirubin,Total: 0.6 mg/dL (ref 0.2–1.0)
Chloride: 95 mmol/L — ABNORMAL LOW (ref 98–107)
Co2: 31 mmol/L (ref 21–32)
Glucose: 238 mg/dL — ABNORMAL HIGH (ref 65–99)
Potassium: 4.1 mmol/L (ref 3.5–5.1)
SGOT(AST): 89 U/L — ABNORMAL HIGH (ref 15–37)
Sodium: 131 mmol/L — ABNORMAL LOW (ref 136–145)

## 2014-09-04 LAB — HEMOGLOBIN A1C: Hemoglobin A1C: 9 % — ABNORMAL HIGH (ref 4.2–6.3)

## 2014-09-04 LAB — LIPID PANEL: Cholesterol: 357 mg/dL — ABNORMAL HIGH (ref 0–200)

## 2014-09-04 LAB — LIPASE, BLOOD

## 2014-09-05 LAB — BASIC METABOLIC PANEL
ANION GAP: 10 (ref 7–16)
BUN: 5 mg/dL — ABNORMAL LOW (ref 7–18)
Chloride: 98 mmol/L (ref 98–107)
Co2: 23 mmol/L (ref 21–32)
Creatinine: 0.29 mg/dL — ABNORMAL LOW (ref 0.60–1.30)
EGFR (Non-African Amer.): >60
Glucose: 199 mg/dL — ABNORMAL HIGH (ref 65–99)
Osmolality: 266 (ref 275–301)
Potassium: 5.2 mmol/L — ABNORMAL HIGH (ref 3.5–5.1)
Sodium: 131 mmol/L — ABNORMAL LOW (ref 136–145)

## 2014-09-05 LAB — TRIGLYCERIDES: Triglycerides: 3449 mg/dL — ABNORMAL HIGH (ref 0–200)

## 2014-09-06 LAB — BASIC METABOLIC PANEL
Anion Gap: 5 — ABNORMAL LOW (ref 7–16)
BUN: 5 mg/dL — ABNORMAL LOW (ref 7–18)
Calcium, Total: 7.3 mg/dL — ABNORMAL LOW (ref 8.5–10.1)
Chloride: 99 mmol/L (ref 98–107)
Co2: 29 mmol/L (ref 21–32)
Creatinine: 0.45 mg/dL — ABNORMAL LOW (ref 0.60–1.30)
Glucose: 160 mg/dL — ABNORMAL HIGH (ref 65–99)
OSMOLALITY: 267 (ref 275–301)
Potassium: 3.8 mmol/L (ref 3.5–5.1)
Sodium: 133 mmol/L — ABNORMAL LOW (ref 136–145)

## 2014-09-06 LAB — PLATELET COUNT: PLATELETS: 198 10*3/uL (ref 150–440)

## 2014-09-06 LAB — TRIGLYCERIDES: Triglycerides: 2839 mg/dL — ABNORMAL HIGH (ref 0–200)

## 2014-09-07 LAB — TRIGLYCERIDES: Triglycerides: 1883 mg/dL — ABNORMAL HIGH (ref 0–200)

## 2014-09-08 LAB — TRIGLYCERIDES: TRIGLYCERIDES: 1363 mg/dL — AB (ref 0–200)

## 2014-10-01 ENCOUNTER — Ambulatory Visit: Payer: Self-pay | Admitting: Specialist

## 2014-10-25 ENCOUNTER — Ambulatory Visit: Payer: Self-pay | Admitting: Internal Medicine

## 2014-10-25 LAB — IRON AND TIBC

## 2014-10-25 LAB — CBC CANCER CENTER
EOS PCT: 2 %
HCT: 39.8 % — AB (ref 40.0–52.0)
Lymphocytes: 39 %
MCV: 78 fL — AB (ref 80–100)
Monocytes: 3 %
OTHER CELLS BLOOD: 6 %
Platelet: 401 x10 3/mm (ref 150–440)
RBC: 5.11 10*6/uL (ref 4.40–5.90)
RDW: 17.1 % — AB (ref 11.5–14.5)
Segmented Neutrophils: 50 %
WBC: 7.9 x10 3/mm (ref 3.8–10.6)

## 2014-10-25 LAB — APTT: ACTIVATED PTT: 36.6 s — AB (ref 23.6–35.9)

## 2014-10-25 LAB — SEDIMENTATION RATE: Erythrocyte Sed Rate: 1 mm/hr (ref 0–15)

## 2014-10-28 ENCOUNTER — Inpatient Hospital Stay: Payer: Self-pay | Admitting: Internal Medicine

## 2014-10-28 LAB — COMPREHENSIVE METABOLIC PANEL
ANION GAP: 10 (ref 7–16)
Albumin: 2.7 g/dL — ABNORMAL LOW (ref 3.4–5.0)
BILIRUBIN TOTAL: 2.3 mg/dL — AB (ref 0.2–1.0)
CHLORIDE: 85 mmol/L — AB (ref 98–107)
CO2: 27 mmol/L (ref 21–32)
Glucose: 418 mg/dL — ABNORMAL HIGH (ref 65–99)
POTASSIUM: 3.3 mmol/L — AB (ref 3.5–5.1)
SODIUM: 122 mmol/L — AB (ref 136–145)
Total Protein: 7 g/dL (ref 6.4–8.2)

## 2014-10-28 LAB — URINALYSIS, COMPLETE
BILIRUBIN, UR: NEGATIVE
Bacteria: NONE SEEN
Blood: NEGATIVE
Glucose,UR: >=500 mg/dL (ref 0–75)
LEUKOCYTE ESTERASE: NEGATIVE
Nitrite: NEGATIVE
Ph: 6 (ref 4.5–8.0)
Protein: 100
RBC,UR: 1 /HPF (ref 0–5)
Specific Gravity: 1.023 (ref 1.003–1.030)
Squamous Epithelial: <1
WBC UR: <1 /HPF (ref 0–5)

## 2014-10-28 LAB — CBC WITH DIFFERENTIAL/PLATELET
Basophil #: 0.1 10*3/uL (ref 0.0–0.1)
Basophil %: 0.8 %
Eosinophil #: 0.1 10*3/uL (ref 0.0–0.7)
Eosinophil %: 2 %
HCT: 39.2 % — AB (ref 40.0–52.0)
HGB: 16.1 g/dL (ref 13.0–18.0)
LYMPHS ABS: 2.3 10*3/uL (ref 1.0–3.6)
Lymphocyte %: 34.7 %
MONO ABS: 0.3 x10 3/mm (ref 0.2–1.0)
Monocyte %: 4.3 %
NEUTROS PCT: 58.2 %
Neutrophil #: 3.9 10*3/uL (ref 1.4–6.5)
Platelet: 195 10*3/uL (ref 150–440)
RDW: 17.2 % — AB (ref 11.5–14.5)
WBC: 6.7 10*3/uL (ref 3.8–10.6)

## 2014-10-28 LAB — TRIGLYCERIDES: Triglycerides: >4000 mg/dL — ABNORMAL HIGH (ref 0–200)

## 2014-10-28 LAB — LIPASE, BLOOD: LIPASE: 2049 U/L — AB (ref 73–393)

## 2014-10-29 LAB — COMPREHENSIVE METABOLIC PANEL
AST: 145 U/L — AB (ref 15–37)
Albumin: 2 g/dL — ABNORMAL LOW (ref 3.4–5.0)
Anion Gap: 6 — ABNORMAL LOW (ref 7–16)
BILIRUBIN TOTAL: 2 mg/dL — AB (ref 0.2–1.0)
CHLORIDE: 97 mmol/L — AB (ref 98–107)
Co2: 25 mmol/L (ref 21–32)
Creatinine: 1 mg/dL (ref 0.60–1.30)
EGFR (Non-African Amer.): >60
Glucose: 337 mg/dL — ABNORMAL HIGH (ref 65–99)
POTASSIUM: 3.3 mmol/L — AB (ref 3.5–5.1)
Sodium: 128 mmol/L — ABNORMAL LOW (ref 136–145)

## 2014-10-29 LAB — CBC WITH DIFFERENTIAL/PLATELET
Basophil: 1 %
EOS PCT: 1 %
HCT: 37.4 % — AB (ref 40.0–52.0)
HGB: 12.4 g/dL — AB (ref 13.0–18.0)
LYMPHS PCT: 43 %
MCH: 27.4 pg (ref 26.0–34.0)
MCHC: 33.3 g/dL (ref 32.0–36.0)
MCV: 82 fL (ref 80–100)
MONOS PCT: 5 %
Platelet: 258 10*3/uL (ref 150–440)
RBC: 4.55 10*6/uL (ref 4.40–5.90)
RDW: 22.9 % — AB (ref 11.5–14.5)
Segmented Neutrophils: 50 %
WBC: 5.3 10*3/uL (ref 3.8–10.6)

## 2014-10-29 LAB — TRIGLYCERIDES: Triglycerides: >4000 mg/dL — ABNORMAL HIGH (ref 0–200)

## 2014-10-30 LAB — COMPREHENSIVE METABOLIC PANEL
Albumin: 2.2 g/dL — ABNORMAL LOW (ref 3.4–5.0)
Anion Gap: 4 — ABNORMAL LOW (ref 7–16)
BILIRUBIN TOTAL: 1.1 mg/dL — AB (ref 0.2–1.0)
CHLORIDE: 97 mmol/L — AB (ref 98–107)
CO2: 28 mmol/L (ref 21–32)
Glucose: 301 mg/dL — ABNORMAL HIGH (ref 65–99)
Potassium: 3.9 mmol/L (ref 3.5–5.1)
Sodium: 129 mmol/L — ABNORMAL LOW (ref 136–145)

## 2014-10-30 LAB — BASIC METABOLIC PANEL
Anion Gap: 1 — ABNORMAL LOW (ref 7–16)
Anion Gap: 5 — ABNORMAL LOW (ref 7–16)
CHLORIDE: 95 mmol/L — AB (ref 98–107)
CO2: 34 mmol/L — AB (ref 21–32)
Chloride: 96 mmol/L — ABNORMAL LOW (ref 98–107)
Co2: 27 mmol/L (ref 21–32)
Glucose: 258 mg/dL — ABNORMAL HIGH (ref 65–99)
Glucose: 259 mg/dL — ABNORMAL HIGH (ref 65–99)
POTASSIUM: 4.9 mmol/L (ref 3.5–5.1)
Potassium: 3.9 mmol/L (ref 3.5–5.1)
SODIUM: 131 mmol/L — AB (ref 136–145)
Sodium: 127 mmol/L — ABNORMAL LOW (ref 136–145)

## 2014-10-30 LAB — TRIGLYCERIDES: Triglycerides: >4000 mg/dL — ABNORMAL HIGH (ref 0–200)

## 2014-10-30 LAB — LIPASE, BLOOD: Lipase: 141 U/L (ref 73–393)

## 2014-10-31 LAB — BASIC METABOLIC PANEL
ANION GAP: 6 — AB (ref 7–16)
Anion Gap: 8 (ref 7–16)
CHLORIDE: 94 mmol/L — AB (ref 98–107)
Chloride: 93 mmol/L — ABNORMAL LOW (ref 98–107)
Co2: 30 mmol/L (ref 21–32)
Co2: 27 mmol/L (ref 21–32)
GLUCOSE: 232 mg/dL — AB (ref 65–99)
GLUCOSE: 205 mg/dL — AB (ref 65–99)
POTASSIUM: 4.9 mmol/L (ref 3.5–5.1)
Potassium: 4.5 mmol/L (ref 3.5–5.1)
SODIUM: 129 mmol/L — AB (ref 136–145)
Sodium: 129 mmol/L — ABNORMAL LOW (ref 136–145)

## 2014-10-31 LAB — LIPASE, BLOOD: LIPASE: 35 U/L — AB (ref 73–393)

## 2014-10-31 LAB — TRIGLYCERIDES: TRIGLYCERIDES: 3797 mg/dL — AB (ref 0–200)

## 2014-11-01 LAB — BASIC METABOLIC PANEL
ANION GAP: 7 (ref 7–16)
Anion Gap: 6 — ABNORMAL LOW (ref 7–16)
CO2: 33 mmol/L — AB (ref 21–32)
Chloride: 95 mmol/L — ABNORMAL LOW (ref 98–107)
Chloride: 95 mmol/L — ABNORMAL LOW (ref 98–107)
Co2: 33 mmol/L — ABNORMAL HIGH (ref 21–32)
Creatinine: 0.53 mg/dL — ABNORMAL LOW (ref 0.60–1.30)
EGFR (Non-African Amer.): >60
GLUCOSE: 135 mg/dL — AB (ref 65–99)
GLUCOSE: 146 mg/dL — AB (ref 65–99)
Potassium: 3.6 mmol/L (ref 3.5–5.1)
Potassium: 3.6 mmol/L (ref 3.5–5.1)
SODIUM: 135 mmol/L — AB (ref 136–145)
Sodium: 134 mmol/L — ABNORMAL LOW (ref 136–145)

## 2014-11-01 LAB — HEPATIC FUNCTION PANEL A (ARMC)
ALBUMIN: 3 g/dL — AB (ref 3.4–5.0)
ALK PHOS: 47 U/L
Bilirubin,Total: 0.7 mg/dL (ref 0.2–1.0)
SGOT(AST): 53 U/L — ABNORMAL HIGH (ref 15–37)
Total Protein: 6.8 g/dL (ref 6.4–8.2)

## 2014-11-01 LAB — TRIGLYCERIDES: Triglycerides: 2586 mg/dL — ABNORMAL HIGH (ref 0–200)

## 2014-11-02 LAB — CBC WITH DIFFERENTIAL/PLATELET
Basophil #: 0.1 10*3/uL (ref 0.0–0.1)
Basophil %: 1.7 %
EOS ABS: 0.1 10*3/uL (ref 0.0–0.7)
Eosinophil %: 3.3 %
HCT: 36.8 % — ABNORMAL LOW (ref 40.0–52.0)
HGB: 12.5 g/dL — ABNORMAL LOW (ref 13.0–18.0)
LYMPHS ABS: 1.6 10*3/uL (ref 1.0–3.6)
LYMPHS PCT: 47.9 %
MCH: 27.3 pg (ref 26.0–34.0)
MCHC: 34 g/dL (ref 32.0–36.0)
MCV: 80 fL (ref 80–100)
Monocyte #: 0.4 x10 3/mm (ref 0.2–1.0)
Monocyte %: 11.1 %
NEUTROS ABS: 1.2 10*3/uL — AB (ref 1.4–6.5)
Neutrophil %: 36 %
Platelet: 156 10*3/uL (ref 150–440)
RBC: 4.58 10*6/uL (ref 4.40–5.90)
RDW: 17 % — ABNORMAL HIGH (ref 11.5–14.5)
WBC: 3.3 10*3/uL — AB (ref 3.8–10.6)

## 2014-11-02 LAB — BASIC METABOLIC PANEL
ANION GAP: 9 (ref 7–16)
BUN: 3 mg/dL — ABNORMAL LOW (ref 7–18)
CALCIUM: 7.6 mg/dL — AB (ref 8.5–10.1)
CO2: 29 mmol/L (ref 21–32)
Chloride: 99 mmol/L (ref 98–107)
Creatinine: 0.8 mg/dL (ref 0.60–1.30)
EGFR (Non-African Amer.): >60
Glucose: 181 mg/dL — ABNORMAL HIGH (ref 65–99)
Osmolality: 275 (ref 275–301)
Potassium: 3.5 mmol/L (ref 3.5–5.1)
Sodium: 137 mmol/L (ref 136–145)

## 2014-11-02 LAB — TRIGLYCERIDES: Triglycerides: 1807 mg/dL — ABNORMAL HIGH (ref 0–200)

## 2014-11-03 LAB — CBC WITH DIFFERENTIAL/PLATELET
BASOS ABS: 0.1 10*3/uL (ref 0.0–0.1)
Basophil %: 1.6 %
EOS PCT: 4.1 %
Eosinophil #: 0.1 10*3/uL (ref 0.0–0.7)
HCT: 37.1 % — ABNORMAL LOW (ref 40.0–52.0)
HGB: 12.1 g/dL — ABNORMAL LOW (ref 13.0–18.0)
LYMPHS ABS: 1.7 10*3/uL (ref 1.0–3.6)
Lymphocyte %: 50.8 %
MCH: 26.3 pg (ref 26.0–34.0)
MCHC: 32.5 g/dL (ref 32.0–36.0)
MCV: 81 fL (ref 80–100)
Monocyte #: 0.3 x10 3/mm (ref 0.2–1.0)
Monocyte %: 8.4 %
Neutrophil #: 1.1 10*3/uL — ABNORMAL LOW (ref 1.4–6.5)
Neutrophil %: 35.1 %
PLATELETS: 154 10*3/uL (ref 150–440)
RBC: 4.58 10*6/uL (ref 4.40–5.90)
RDW: 17.4 % — ABNORMAL HIGH (ref 11.5–14.5)
WBC: 3.3 10*3/uL — ABNORMAL LOW (ref 3.8–10.6)

## 2014-11-03 LAB — TRIGLYCERIDES: TRIGLYCERIDES: 1348 mg/dL — AB (ref 0–200)

## 2014-11-03 LAB — BASIC METABOLIC PANEL
ANION GAP: 5 — AB (ref 7–16)
BUN: 3 mg/dL — AB (ref 7–18)
CHLORIDE: 100 mmol/L (ref 98–107)
CO2: 33 mmol/L — AB (ref 21–32)
Calcium, Total: 7.8 mg/dL — ABNORMAL LOW (ref 8.5–10.1)
Creatinine: 0.96 mg/dL (ref 0.60–1.30)
EGFR (African American): >60
EGFR (Non-African Amer.): >60
GLUCOSE: 136 mg/dL — AB (ref 65–99)
OSMOLALITY: 274 (ref 275–301)
Potassium: 3.6 mmol/L (ref 3.5–5.1)
Sodium: 138 mmol/L (ref 136–145)

## 2014-11-04 LAB — CBC WITH DIFFERENTIAL/PLATELET
Basophil #: 0 10*3/uL (ref 0.0–0.1)
Basophil %: 0.8 %
EOS ABS: 0.2 10*3/uL (ref 0.0–0.7)
Eosinophil %: 4.2 %
HCT: 37.9 % — ABNORMAL LOW (ref 40.0–52.0)
HGB: 12.4 g/dL — ABNORMAL LOW (ref 13.0–18.0)
Lymphocyte #: 1.8 10*3/uL (ref 1.0–3.6)
Lymphocyte %: 44.7 %
MCH: 26.2 pg (ref 26.0–34.0)
MCHC: 32.7 g/dL (ref 32.0–36.0)
MCV: 80 fL (ref 80–100)
MONO ABS: 0.3 x10 3/mm (ref 0.2–1.0)
Monocyte %: 7 %
NEUTROS PCT: 43.3 %
Neutrophil #: 1.8 10*3/uL (ref 1.4–6.5)
Platelet: 163 10*3/uL (ref 150–440)
RBC: 4.74 10*6/uL (ref 4.40–5.90)
RDW: 16.9 % — ABNORMAL HIGH (ref 11.5–14.5)
WBC: 4.1 10*3/uL (ref 3.8–10.6)

## 2014-11-04 LAB — PROTIME-INR
INR: 1
Prothrombin Time: 12.8 secs (ref 11.5–14.7)

## 2014-11-04 LAB — BASIC METABOLIC PANEL
ANION GAP: 7 (ref 7–16)
BUN: 7 mg/dL (ref 7–18)
CREATININE: 0.9 mg/dL (ref 0.60–1.30)
Calcium, Total: 8.5 mg/dL (ref 8.5–10.1)
Chloride: 100 mmol/L (ref 98–107)
Co2: 30 mmol/L (ref 21–32)
EGFR (Non-African Amer.): >60
Glucose: 136 mg/dL — ABNORMAL HIGH (ref 65–99)
Osmolality: 274 (ref 275–301)
Potassium: 3.6 mmol/L (ref 3.5–5.1)
Sodium: 137 mmol/L (ref 136–145)

## 2014-11-04 LAB — LIPASE, BLOOD: Lipase: 45 U/L — ABNORMAL LOW (ref 73–393)

## 2014-11-04 LAB — TRIGLYCERIDES: Triglycerides: 1201 mg/dL — ABNORMAL HIGH (ref 0–200)

## 2014-11-11 ENCOUNTER — Ambulatory Visit: Payer: Self-pay | Admitting: Internal Medicine

## 2014-12-10 ENCOUNTER — Ambulatory Visit
Admit: 2014-12-10 | Disposition: A | Discharge status: Home / Self Care | Payer: Self-pay | Attending: Internal Medicine | Admitting: Internal Medicine

## 2014-12-17 DIAGNOSIS — N401 Enlarged prostate with lower urinary tract symptoms: Secondary | ICD-10-CM | POA: Insufficient documentation

## 2014-12-17 DIAGNOSIS — N365 Urethral false passage: Secondary | ICD-10-CM | POA: Insufficient documentation

## 2014-12-17 DIAGNOSIS — R339 Retention of urine, unspecified: Secondary | ICD-10-CM | POA: Insufficient documentation

## 2015-01-28 ENCOUNTER — Encounter: Payer: Self-pay | Admitting: Internal Medicine

## 2015-01-28 ENCOUNTER — Telehealth: Payer: Self-pay | Admitting: Internal Medicine

## 2015-01-28 NOTE — Consult Note (Signed)
CC: pancreatitis due to hypertriglycerides.  Feel much better, able to space out pain medicine longer.  Started advancing of diet slowly.  I will sign off.  Dr. Candace Cruise is on call this weekend but will not see unless called about a GI problem.  Electronic Signatures: Manya Silvas (MD)  (Signed on 15-Nov-13 18:40)  Authored  Last Updated: [id number] 18:40 by Manya Silvas (MD)

## 2015-01-28 NOTE — Telephone Encounter (Signed)
Pt is complaining of pain in his ear. Wanted to know if Dr. Derrel Nip would call in a precription? I scheduled him an appointment for tomorrow at 4:15p.m.

## 2015-01-28 NOTE — Consult Note (Signed)
CC: abd pain, he still has about same pain, lipase down to 200, US showed hepatosplenomegaly.  Fatty liver also.  No biliary disease.  Continue current therapy course, hold EGD for now.  Abd seems a little less tender on my exam.  Electronic Signatures: Manya Silvas (MD)  (Signed on 12-Nov-13 18:03)  Authored  Last Updated: 12-Nov-13 18:03 by Manya Silvas (MD)

## 2015-01-28 NOTE — Consult Note (Signed)
CC: pancreatitis due to hypertriglycerides,  pt abd starting to feel better.  Still on clear liq diet and insulin pump.  Abd still tender but not quite as much.  No other suggestions at this time.  Electronic Signatures: Manya Silvas (MD)  (Signed on [phone] 17:07)  Authored  Last Updated: 14-Nov-13 17:07 by Manya Silvas (MD)

## 2015-01-28 NOTE — Consult Note (Signed)
PATIENT NAME:  Sergio [name], Sergio [name] MR#:  [id number] DATE OF BIRTH:  1976-01-09  DATE OF CONSULTATION:  08/21/2012  REFERRING PHYSICIAN:   CONSULTING PHYSICIAN:  Manya Silvas, MD  HISTORY OF PRESENT ILLNESS: Patient is a 39 year old white male who was admitted for abdominal pain. He has a history of attacks of pancreatitis related to very elevated triglyceride levels. He was admitted about 15 months ago to [facility] with triglycerides extremely high causing pancreatitis. He follows at [place] with Dr. Arther Dames in the past. He spent a week at [facility] earlier this year. Patient takes his nighttime insulin but he does not take mealtime insulin. This has been his habit for a couple of months so far.    Two days ago he developed pain in the epigastric area, progressively got worse. He quit eating Sunday and was admitted to the hospital today.   REVIEW OF SYSTEMS: He denies vomiting. He is nauseated. No diarrhea. No fever. No asthma, wheezing, emphysema, chronic cough, shortness of breath. No exertional chest pains or skipping irregular heartbeats or known cardiac disease. No dysuria, hematuria, prostate trouble or kidney stones   HABITS: Does not smoke, does not drink.   PAST MEDICAL HISTORY:  1. Familial hypertriglyceridemia.  2. Type 2 diabetes. 3. Obesity.  4. Obstructive sleep apnea.  5. Hypertension.  6. Peptic ulcer disease on endoscopy in the past.   PAST SURGICAL HISTORY:  1. Tonsillectomy.  2. Back surgery.  3. Vasectomy.   ALLERGIES: IV contrast causes kidney weakness, codeine causes respiratory distress.   HOME MEDICATIONS:  1. Xanax 0.5 mg at bedtime p.r.n. for sleep.  2. Coreg 6.25 mg b.i.d.  3. Gemfibrozil 600 mg p.o. b.i.d.  4. Lantus 50 units subcutaneously at bedtime.  5. Lisinopril 20 mg a day.  6. Lovaza 1000 mg 4 capsules a day.  7. Metformin 1000 mg p.o. b.i.d.  8. Omeprazole 20 mg a day.  9. Zoloft 100 mg a day. 10. Supposedly NovoLog FlexPen 6 units 3 times a day before  meals. He has not been taking this for a couple of months.   FAMILY HISTORY: Patient is adopted.   SOCIAL HISTORY: Lives at home with his wife.   PHYSICAL EXAMINATION:  GENERAL: White male in no acute distress, pleasant., good historian.   HEENT: Sclerae nonicteric. Conjunctivae negative. Tongue negative. The head is atraumatic.   CHEST: Clear.   HEART: No murmurs, gallops, clicks, or rubs.   ABDOMEN: Obese. Bowel sounds are diminished. There is tenderness across the upper abdomen and in the right upper and right mid quadrants.   LABORATORY, DIAGNOSTIC AND RADIOLOGICAL DATA: White count 7.8, platelet count 296, cholesterol 546, LDL is not calculated, sodium 133, potassium 4, chloride 100, bicarbonate 25, BUN 11, creatinine 0.78, glucose 217, calcium 9.1, ALT 28, AST 20, alkaline phosphatase 68, total bilirubin 0.3, albumin 4.3, lipase 451, LDH 123.   ASSESSMENT: Abdominal pain across the abdomen in a patient with previous pancreatitis with hypertriglyceridemia who has not been compliant with diabetic therapy. Likely he does have some degree of pancreatitis with or without significantly elevated lipase. I cannot discount the possibility that he has a biliary tract problem contributing to this.   RECOMMENDATIONS:  1. Agree with insulin drip.  2. Ultrasound of the abdomen tomorrow.  3. If that is negative try MRI of the abdomen Wednesday. I would treat him with IV PPIs for now. Do not see an indication for endoscopy at this time. Will follow with you.  ____________________________ Manya Silvas,  MD rte:cms D: 08/21/2012 19:05:06 ET T: 08/22/2012 05:39:44 ET JOB#: [id number]  cc: Manya Silvas, MD, <Dictator> Manya Silvas MD ELECTRONICALLY SIGNED 08/23/2012 17:21

## 2015-01-28 NOTE — Discharge Summary (Signed)
PATIENT NAME:  Sergio [name], Sergio [name] MR#:  [id number] DATE OF BIRTH:  03/06/1976  DATE OF ADMISSION:  08/21/2012 DATE OF DISCHARGE:  08/28/2012  ADMITTING DIAGNOSIS: Acute pancreatitis.  DISCHARGE DIAGNOSES: 1. Acute on chronic pancreatitis due to hypertriglyceridemia.  2. Diabetes mellitus, insulin-dependent. Hemoglobin A1c 9.3.  3. Hypertension.  4. Obstructive sleep apnea, on CPAP at night.  5. History of depression.   DISCHARGE CONDITION: Stable.   DISCHARGE MEDICATIONS: The patient is to resume his outpatient medications which are:  1. Gemfibrozil 600 mg p.o. twice daily. 2. Metformin 1 gram p.o. twice daily. 3. Omeprazole 20 mg p.o. daily.  4. Lovaza 1 gram 4 capsules once daily. 5. Carvedilol 6.25 mg twice daily. 6. Sertraline 100 mg p.o. daily. 7. Alprazolam 0.5 mg 1 tablet at bedtime as needed.  8. Lisinopril 20 mg p.o. daily.  9. Lantus insulin 60 units subcutaneously daily at bedtime. This is a new dose.  10. NovoLog FlexPen 12 units subcutaneously 3 times daily before meals as well as sliding scale insulin which would be 2 units subcutaneously up to 4 times day as needed for blood glucose levels going higher than 150 during Accu-Cheks to add 2 units subcutaneously for each 25 grams above 150.   DO NOT TAKE: The patient is to stop his insulin Lantus 50 units subcutaneously daily dose.   DIET: 2 gram salt, low fat, low cholesterol, carbohydrate controlled diet, regular consistency.   ACTIVITY LIMITATIONS: As tolerated. The patient was advised to advance his physical activity and to lose weight if possible.   FOLLOW-UP: Follow-up appointment at [facility] Endocr[facility]ology in two days after discharge.   CONSULTANTS:  1. Dr. Gabriel Carina, Endocrinology  2. Dr. Gaylyn Cheers, Gastroenterology   RADIOLOGIC STUDIES: Ultrasound of abdomen, general survey, 08/22/2012, revealed limited study due to the patient's body habitus, splenomegaly as well as hepatomegaly with fatty infiltrative changes  noted. Gallbladder exhibited no evidence of stones or acute cholecystitis. Pancreas and abdominal aorta and inferior vena cava are obscured by bowel gas. Kidneys exhibited no acute abnormalities.   REASON FOR ADMISSION: The patient is a 39 year old Caucasian male with past medical history significant for history of diabetes mellitus, history of chronic pancreatitis in the past who presented to the hospital with complaints of abdominal pains. Please refer to Dr. Wyatt Portela admission note on 08/21/2012. He was also complaining of nausea as well as vomiting.   LABORATORY, DIAGNOSTIC, AND RADIOLOGICAL DATA: On arrival to the hospital the patient's lipase level was elevated to 450. The patient's labs overall were unremarkable except sodium level was somewhat low at 133 signifying dehydration. The patient's glucose level was high at 270. The patient's liver enzymes were unremarkable. Cholesterol level was high at 546, triglycerides were high at 710, HDL was low at 20, LDL was 123. The patient's white blood cell count was normal at 7.8. Hemoglobin was not possible to be checked, however, hematocrit was checked at 38.2. Platelet count was normal at 296 with low MCV of 76. Urinalysis revealed yellow clear urine, more than 500 glucose, negative for bilirubin, trace ketones were seen as well as specific gravity of 1.027, pH 5.0, negative for blood, 100 mg/dL protein, negative for nitrites or leukocyte esterase, less than 1 red blood cell, 1 white blood cell, trace bacteria, less than 1 epithelial cell as well as mucus was present.  HOSPITAL COURSE: The patient was admitted to the hospital. He was started on IV fluids as well as was kept n.p.o. Pain medications were also initiated. The patient was  seen by endocrinologist, Dr. Gabriel Carina, who saw the patient in consultation the same day on 08/21/2012. She felt because of the patient's significant hypertriglyceridemia the patient is supposed to be started on insulin IV drip.  Although the patient reported lab values at around 710, it was felt to be inaccurate. Dr. Gabriel Carina spoke to the lab and realized that the lab had difficulty running the sample because it was extremely lipemic. For this reason, it was sent to [facility] for a second assessment. However, according to Dr. Gabriel Carina, she felt that the patient's triglycerides were very likely above 1000 and even higher possibly. During the patient's prior hospitalization in August 2012, it was noted that the patient's initial triglyceride level was greater than 4000. Given the evidence of hypertriglyceridemia as well as pancreatitis, Dr. Gabriel Carina felt that certainly it is warranted to pursue lowering his triglyceride level as much as possible. For this reason she initiated IV insulin as well as one of the methods of lowering the patient's high triglycerides. She also recommended to continue the patient's gemfibrozil as well as Lovaza. With this therapy the patient improved. His condition improved and he was restarted on oral diet subsequently. His pain also improved as well as his lipase level and triglyceride level. Triglyceride levels were followed while the patient was in the hospital almost daily and even though initially they were elevated to 700's they were noted to be much higher, as high as 2392 on 08/26/2012. With therapy, however, the patient's triglyceride level improved and on 08/28/2012 level is 813. It was felt to be improvement from prior triglyceride level and because the patient's abdominal pain normalized and the patient had no significant discomfort even with normal regular diet it was felt that he is stable to be discharged home. The patient is to continue low fat, low cholesterol, diabetic diet. He is to follow-up with his primary care physician at [facility] as well as Endocrinology at [facility]. The patient is to continue his usual outpatient medications.  For diabetes control, the  patient's hemoglobin A1c was checked and was found to be high at 9.3. It was felt that the patient's diabetic medication, insulin Lantus, should be advanced. The patient's insulin Lantus was advanced to 60 units subcutaneously at bedtime. However, the patient was recommended to get lifestyle education outpatient center and advance his diabetic medications depending on his needs. Even though I thought that the patient would benefit from outpatient lifestyle intervention, the patient was reluctant to have any other treatment done at [facility]. He decided to return back to his [facility] endocrinologist. The patient is to follow-up with Endocrinology in the next few days after discharge. The patient is to continue gemfibrozil as well as Fish Oil.   In regards to hypertension, the patient is to continue outpatient medications.   In regards to obstructive sleep apnea, the patient is to continue CPAP at night.   The patient is to follow-up with [facility], Dr. Laurence Aly.   VITAL SIGNS ON THE DAY OF DISCHARGE: Temperature 97, pulse 70, respiration rate 20, blood pressure 136/81, saturation 97% on room air at rest.      TIME SPENT: 40 minutes.   ____________________________ Theodoro Grist, MD rv:drc D: 08/28/2012 18:33:09 ET T: 08/29/2012 10:01:04 ET JOB#: [id number]  cc: Theodoro Grist, MD, <Dictator> [facility] Endocrin[facility]  Monserat Prestigiacomo MD ELECTRONICALLY SIGNED 09/08/2012 13:13

## 2015-01-28 NOTE — H&P (Signed)
PATIENT NAME:  Sergio [name], Sergio [name] MR#:  [id number] DATE OF BIRTH:  1976/06/08  DATE OF ADMISSION:  08/21/2012  PRIMARY CARE PHYSICIAN:  Non-local.  CHIEF COMPLAINT: Abdominal pain, nausea, vomiting.   HISTORY OF PRESENT ILLNESS: Mr. Sergio [name] is a 39 year old Caucasian male with history of hypertriglyceridemia with chronic pancreatitis, type 2 diabetes mellitus on insulin at home, and hypertension who follows at [facility], and comes to the hospital secondary to abdominal pain and nausea for two days now. The patient had four or five admissions for acute pancreatitis with similar presentations in the last couple of years; the last one was in 05/2012 at [facility]. According to the wife the patient has been depressed lately and he stopped taking his medications which include insulin and hypertriglyceridemia medications, which triggers these episodes. His triglyceride level at this time is pending, though his lipase is only elevated at 450 because of his chronic pancreatitis.  He is clearly having severe abdominal pain with nausea at this time. He is being admitted for the same.  In the past also of note the patient was on insulin drip for decreasing his triglyceride level while he was hospitalized.   PAST MEDICAL HISTORY:  1. Familial hypertriglyceridemia.  2. Type 2 diabetes mellitus.  3. Obesity.  4. Obstructive sleep apnea.  5. Multiple admissions for acute pancreatitis, the last one being in 05/2012 at [facility].  6. Hypertension.   PAST SURGICAL HISTORY:  1. Tonsillectomy. 2. Back surgery.  3. Vasectomy.    ALLERGIES TO MEDICATIONS: IV contrast, codeine causes respiratory distress.  CURRENT HOME MEDICATIONS:   1. Xanax 0.5 mg at bedtime as needed for sleep.  2. Coreg 6.25 mg b.i.d.  3. Gemfibrozil 600 mg p.o. b.i.d.  4. Lantus 50 units subcutaneous at bedtime.  5. Lisinopril 20 mg p.o. daily.  6. Lovaza 1000 mg 4 capsules daily.  7. Metformin 1000 mg p.o. b.i.d.  8. NovoLog Flex pen 6 units 3 times a day  before meals.  9. Omeprazole 20 mg p.o. daily.  10. Zoloft 100 mg p.o. daily.   SOCIAL HISTORY: Lives at home with his wife. No history of any smoking, alcohol, or drug use.   FAMILY HISTORY: The patient is adopted, does not know anything about his family.     REVIEW OF SYSTEMS: CONSTITUTIONAL: Positive for fatigue, weakness. No fever. EYES:  No blurred vision, double vision, inflammation, glaucoma, or cataracts. ENT: No tinnitus, ear pain, hearing loss, epistaxis, or discharge. RESPIRATORY: No cough, wheeze, hemoptysis, or chronic obstructive pulmonary disease. CARDIOVASCULAR: No chest pain, orthopnea, edema, arrhythmia, palpitations, or syncope. GI: Positive for nausea. No vomiting. No diarrhea. Positive for abdominal pain. No hematemesis or rectal bleed.  GU: No dysuria or hematuria. Positive for oliguria today from dehydration. ENDOCRINE: No polyuria, nocturia, thyroid problems, heat or cold intolerance. HEMATOLOGIC: No anemia, easy bruising or bleeding. SKIN: No acne, rash, or lesions. MUSCULOSKELETAL: No neck, back, shoulder pain, arthritis, or gout. NEUROLOGIC: No numbness, weakness, cerebrovascular accident, transient ischemic attack, or seizures. PSYCHOLOGICAL: No anxiety, insomnia, or depression.   PHYSICAL EXAMINATION:  VITAL SIGNS: Temperature 96.6 degrees Fahrenheit, pulse 87, respirations 18, blood pressure 139/93, pulse oximetry 96% on room air.   GENERAL: Well-built, well-nourished male lying in bed, not in any acute distress.   HEENT: Normocephalic, atraumatic. Pupils equal, round, reacting to light. Anicteric sclerae. Extraocular movements intact. Oropharynx clear without erythema, mass, or exudates.   NECK: Supple. No thyromegaly, JVD, or carotid bruits. No lymphadenopathy.   LUNGS: Moving air bilaterally. No wheeze  or crackles. No use of accessory muscles for breathing.   CARDIOVASCULAR: S1, S2 regular rate and rhythm. No murmurs, rubs, or gallops.   ABDOMEN: Soft,  tenderness in the right upper quadrant. He has just received his pain medication. No hepatosplenomegaly. Hypoactive bowel sounds.   EXTREMITIES: No pedal edema. No clubbing or cyanosis. 2+ dorsalis pedis pulses palpable bilaterally.   SKIN: No acne, rash, or lesions.   LYMPH: No cervical lymphadenopathy.   NEUROLOGIC: Cranial nerves intact. No focal motor or sensory deficits.   PSYCHOLOGICAL: The patient is awake, alert, oriented times three.   LABORATORY DATA: WBC 7.8, hemoglobin-unable to calculate due to severe lipemia. Platelet count is 296. Cholesterol is 546. LDL is not calculated due to elevated LDL level. Sodium 133, potassium 4, chloride 100, bicarbonate 25, BUN 11, creatinine 0.78, glucose 217, calcium 9.1. ALT 28, AST 20, alkaline phosphatase 68, total bili 0.3, albumin 4.3. Lipase elevated at 451, LDH 123.  ASSESSMENT AND PLAN: 39 year old male with past medical history of hypertriglyceridemia with chronic pancreatitis, hypertension, and type 2 diabetes mellitus coming in with nausea and abdominal pain, lipase 450. 1. Acute on chronic abdominal pain due to hypertriglyceridemia, levels are pending. We will admit the patient, keep him n.p.o., IV fluids, and GI consult. Follow up lipase in the morning.  Because the patient had similar symptoms on past admissions CT is not being done now and also the patient is allergic to IV contrast. Continue pain medications, nausea medications.  If the triglyceride levels come up we will have to ask endocrinology to see if insulin drip can help in bringing the levels down.  On past admissions his levels have been as high as greater than 4000.  2. Diabetes mellitus. Lantus, half the dose as n.p.o. now and metformin held.  Hemoglobin A1c is ordered and again, endocrinology consult to see if the patient needs an insulin drip.  3. Hypertension. Hold p.o. medications. 4. Depression. Hold Zoloft for now until able to take clear liquids and more p.o.  medications.  5. Hypertriglyceridemia. We will have to continue oral medications to get the levels down. Follow up the levels. 6. CODE STATUS: FULL CODE.  TIME SPENT ON ADMISSION: 50 minutes.   ____________________________ Gladstone Lighter, MD rk:bjt D: 08/21/2012 15:39:48 ET T: 08/21/2012 16:48:10 ET JOB#: [id number]  cc: Gladstone Lighter, MD, <Dictator> Gladstone Lighter MD ELECTRONICALLY SIGNED 08/24/2012 13:38

## 2015-01-28 NOTE — Consult Note (Signed)
PATIENT NAME:  Sergio [name], Sergio [name] MR#:  [id number] DATE OF BIRTH:  [birth year]  DATE OF CONSULTATION:  08/21/2012  REFERRING PHYSICIAN:  Gladstone Lighter, M.D.  CONSULTING PHYSICIAN:  A. Lavone Orn, MD  CHIEF COMPLAINT: Severe hypertriglyceridemia and pancreatitis.   HISTORY OF PRESENT ILLNESS: This is a 39 year old male with a history of familial hypertriglyceridemia who presented with abdominal pain and nausea for two days. He has a history of recurrent pancreatitis and was admitted to this hospital on two prior occasions for this condition, August 2012 and December 2011. Symptoms were similar to prior presentations. He suspected he had uncontrolled hypertriglyceridemia as the cause of his symptoms. He has noted eruptive xanthomas worsening over the last month. He has not been following a low cholesterol diet. Initial labs showed a glucose of 270, triglycerides 710, and lipase of 451 with a hemoglobin A1c of 9.3%. He does have chronic type 2 diabetes which is typically managed with multiple daily injections of insulin and metformin. He reports noncompliance with his mealtime NovoLog although has been compliant with taking all of his oral medications. During a prior admission in August 2012, his severe hypertriglyceridemia was managed with IV insulin. Question for this consultant is whether is IV insulin was again warranted.   Patient follows with Dr. Nadyne Coombes at [facility]. He reports he was last seen there about two months ago. His hypertriglyceridemia is managed with gemfibrozil and Lovaza.    PAST MEDICAL HISTORY:  1. Familial hypertriglyceridemia.  2. Type 2 diabetes.  3. Morbid obesity.  4. Recurrent pancreatitis.  5. Obstructive sleep apnea.  6. Hypertension.   PAST SURGICAL HISTORY:  1. Tonsillectomy. 2. Two back surgery.  3. Vasectomy.   ALLERGIES: Codeine.   CURRENT INPATIENT MEDICATION: Protonix 40 mg IV every 12 hours   OUTPATIENT MEDICATIONS:  1. Metformin 1 gram  b.i.d.  2. Gemfibrozil 600 mg b.i.d.  3. Lantus 50 units at bedtime.  4. NovoLog 12 units t.i.d. before meals plus sliding scale.  5. Lovaza 4 grams daily.  6. Coreg 6.25 mg b.i.d.  7. Lisinopril 20 mg daily.  8. Omeprazole 20 mg daily.  9. Zoloft 100 mg daily.   SOCIAL HISTORY: Patient is married. He does not smoke or drink alcohol.   FAMILY HISTORY: Not known, patient is adopted.    REVIEW OF SYSTEMS: GENERAL: No recent weight gain or loss. No fever. HEENT: No blurred vision. No sore throat. NECK: No neck pain. No dysphasia. CARDIAC: No chest pain. No palpitations. PULMONARY: No cough, no shortness of breath. ABDOMEN: Diffuse abdominal discomfort with poor appetite. He has had nausea without emesis. GENITOURINARY: He has not been able to urinate all day. He denies any lower pelvic discomfort. He denies history of hematuria. No recent dysuria. ENDOCRINE: No heat or cold intolerance. HEMATOLOGIC: No recent bleeding or easy bruisability. SKIN: Again he has these eruptive xanthomas which has been worsening over the last month. MUSCULOSKELETAL: No weakness. No edema.   PHYSICAL EXAMINATION:  VITAL SIGNS: Weight 263 pounds, temperature 98.2, pulse 84, respirations 20, blood pressure 104/69, pulse oximetry 94% on room air.   GENERAL: Morbidly obese white male in no acute distress.   HEENT: Extraocular movements are intact. Oropharynx is clear. Mucous membranes moist.   NECK: Supple. No thyromegaly.   CARDIAC: Regular rate and rhythm without audible murmur. No carotid bruit.   PULMONARY: Clear to auscultation bilaterally. No wheeze or rhonchi.   ABDOMEN: Diffusely soft, nontender, nondistended.   EXTREMITIES: No edema is present.   SKIN:  Hypopigmented papular round lesions scattered over the extremities and trunk as well as both on the tendons and other surfaces. No rash is present.   LABORATORY, DIAGNOSTIC AND RADIOLOGICAL DATA: Glucose 270, BUN 11, sodium 133, potassium 4, total  cholesterol 546, triglyceride 710, HDL 30, lipase 451, hemoglobin A1c 9.3%, albumin 4.3, hematocrit 38.2%. Urinalysis showed greater than 500 glucose, 100 protein, negative nitrite, negative leukocyte esterase.   ASSESSMENT: This is a 39 year old male with familial hypertriglyceridemia, obesity, and type 2 diabetes presenting with nausea and abdominal pain consistent with acute pancreatitis likely due to his severe hypertriglyceridemia.   RECOMMENDATIONS:  1. Regarding his elevated triglycerides, it is thought that lab reported value of 710 is inaccurate. I spoke to the lab and they had a lot of difficulty running his sample as it was extremely lipemic. They do plan to send it out to [facility] for a second assessment. I presume his triglycerides are certainly over 5701 and perhaps even higher. During his previous hospitalization in August 2012 it should be noted that his initial triglyceride level was greater than 4000.  2. Given evidence of severe hypertriglyceridemia with pancreatitis, certainly is warranted that we pursue lowering his triglycerides as much as possible. I will initiate IV insulin as one method of lowering the triglycerides. I recommend continuing his gemfibrozil as well as the Lovaza.  3. Diabetes type 2, uncontrolled with elevated sugars. This will also be managed with IV insulin in addition to continuing his metformin.  4. Due to concerns about oliguria, this may be due to dehydration versus bladder outlet obstruction. He has had no pelvic pains so it is more likely this is dehydration although I did agree to get a bladder scan. He has been given a lot of narcotics today and this can cause urinary retention.     Thank you for your kind request for consultation. I will follow along with you.   ____________________________ A. Lavone Orn, MD ams:cms D: 08/21/2012 19:49:11 ET T: 08/22/2012 10:00:48 ET JOB#: [id number]  cc: A. Lavone Orn, MD, <Dictator>  [name]  MD ELECTRONICALLY SIGNED 08/22/2012 13:33

## 2015-01-29 ENCOUNTER — Ambulatory Visit (INDEPENDENT_AMBULATORY_CARE_PROVIDER_SITE_OTHER): Payer: BLUE CROSS/BLUE SHIELD | Admitting: Internal Medicine

## 2015-01-29 ENCOUNTER — Encounter: Payer: Self-pay | Admitting: Internal Medicine

## 2015-01-29 VITALS — BP 120/94 | HR 85 | Temp 98.5°F | Resp 16 | Ht 71.0 in | Wt 212.5 lb

## 2015-01-29 DIAGNOSIS — E1141 Type 2 diabetes mellitus with diabetic mononeuropathy: Secondary | ICD-10-CM

## 2015-01-29 DIAGNOSIS — I10 Essential (primary) hypertension: Secondary | ICD-10-CM | POA: Diagnosis not present

## 2015-01-29 DIAGNOSIS — J0100 Acute maxillary sinusitis, unspecified: Secondary | ICD-10-CM | POA: Diagnosis not present

## 2015-01-29 DIAGNOSIS — Z9884 Bariatric surgery status: Secondary | ICD-10-CM

## 2015-01-29 DIAGNOSIS — E1149 Type 2 diabetes mellitus with other diabetic neurological complication: Secondary | ICD-10-CM

## 2015-01-29 DIAGNOSIS — E781 Pure hyperglyceridemia: Secondary | ICD-10-CM | POA: Diagnosis not present

## 2015-01-29 DIAGNOSIS — Z9049 Acquired absence of other specified parts of digestive tract: Secondary | ICD-10-CM

## 2015-01-29 DIAGNOSIS — E1165 Type 2 diabetes mellitus with hyperglycemia: Secondary | ICD-10-CM

## 2015-01-29 DIAGNOSIS — IMO0002 Reserved for concepts with insufficient information to code with codable children: Secondary | ICD-10-CM

## 2015-01-29 DIAGNOSIS — Z9889 Other specified postprocedural states: Secondary | ICD-10-CM

## 2015-01-29 MED ORDER — LEVOFLOXACIN 500 MG PO TABS: 500.0000 mg | ORAL_TABLET | Freq: Every day | ORAL | Status: DC

## 2015-01-29 MED ORDER — BENZONATATE 200 MG PO CAPS: 200.0000 mg | ORAL_CAPSULE | Freq: Three times a day (TID) | ORAL | Status: DC | PRN

## 2015-01-29 NOTE — Progress Notes (Signed)
Pre-visit discussion using our clinic review tool. No additional management support is needed unless otherwise documented below in the visit note.  

## 2015-01-29 NOTE — Progress Notes (Signed)
Patient ID: Sergio [name], male   DOB: 01-11-1976, 39 y.o.   MRN: [id number]  Patient Active Problem List   Diagnosis Date Noted  . Sinusitis, acute maxillary 02/01/2015  . S/P bariatric surgery 02/01/2015  . S/P laparoscopic cholecystectomy 02/01/2015  . Pancreatitis, acute 07/21/2014  . Diarrhea 05/29/2012  . Recurrent pancreatitis 05/29/2012  . Generalized anxiety disorder 04/29/2012  . Obesity (BMI 30-39.9) 04/29/2012  . DM (diabetes mellitus), type 2, uncontrolled w/neurologic complication   . Hypertriglyceridemia   . HTN (hypertension)   . Sleep apnea     Subjective:  CC:   Chief Complaint  Patient presents with  . Otalgia    Right ear   . Sinusitis    nasal discharge sinus pressure, yellow to green mucus.    HPI:   Sergio [name] is a 39 y.o. male who presents for   Multiple issues,  Patient was last seen June 2014.  Since then he has had multiple hospitalizaitons for trigcyeride induced pancreatitis, both at [facility] and at [facility] Largely due to medication noncompliance.  He underwent bariatric surgery,  s/p gastric sleeve and cholecystectomy in Feb 2016 at [facility].  The recovery was complicated by urinary retention secondary to narcotics and unfortunately he suffered catheter induced urethral trauma requiring urologic evaluation but no surgery., which resolved under the supervision of Dr. Jacqlyn Larsen.  He reports that his previously uncontrolled Type 2 DM has resolved with his weight loss,  But his endocrinologist [name], whoh he saw  Yesterday, did not agree with his decision to stop metformin and fenofibrate . He states that his blood sugars are running normal except for one postprandial high at 174 . Average n his metter is 112. His fasting lab appt is tomorrow .   He has been under the care of a psychiatrist , Dr Derenda Mis in [facility], who is is seeing weekly  For management of GAD and ADD with zoloft and adderall  Cc today is concern for sinusitis.  Reports sinus pressure with  persistent coough and bilateral ear pain ,  Right greater than left.  Symptoms started 5 days ago with sneezing,  Itching eyes,  Rhinorrhea and congestion. He is not taking anything OTC .  He denies fevers but has purulent sinus drainage.   Request for DMV form to be filled out.  He has had to have annual medical authorization to drive .     Past Medical History  Diagnosis Date  . Chronic pancreatitis May 2009    secondary to hypertirglyceridemia   . Gastric ulcer 2009    by endo  . Sleep apnea 1999    uses CPAP, auto titrates,  Humphrey Rolls)  . Chicken pox   . Diabetes mellitus   . HTN (hypertension)   . Hypertriglyceridemia     severe    Past Surgical History  Procedure Laterality Date  . Tonsillectomy  2003  . Vasectomy    . Lumbar disc surgery  2008       The following portions of the patient's history were reviewed and updated as appropriate: Allergies, current medications, and problem list.    Review of Systems:   Patient denies headache, fevers, malaise, unintentional weight loss, skin rash, eye pain, sinus congestion and sinus pain, sore throat, dysphagia,  hemoptysis , cough, dyspnea, wheezing, chest pain, palpitations, orthopnea, edema, abdominal pain, nausea, melena, diarrhea, constipation, flank pain, dysuria, hematuria, urinary  Frequency, nocturia, numbness, tingling, seizures,  Focal weakness, Loss of consciousness,  Tremor, insomnia, depression,  anxiety, and suicidal ideation.     History   Social History  . Marital Status: Married    Spouse Name: N/A  . Number of Children: 3  . Years of Education: N/A   Occupational History  . Corporate treasurer    Social History Main Topics  . Smoking status: Never Smoker   . Smokeless tobacco: Never Used  . Alcohol Use: Yes     Comment: once month  . Drug Use: No  . Sexual Activity: Not on file   Other Topics Concern  . Not on file   Social History Narrative    Daily Caffeine Use:  2-3 week/Soda   Regular Exercise -  NO    Objective:  Filed Vitals:   01/29/15 1629  BP: 120/94  Pulse: 85  Temp: 98.5 F (36.9 C)  Resp: 16     General appearance: alert, cooperative and appears stated age Ears: normal TM's and external ear canals both ears Throat: lips, mucosa, and tongue normal; teeth and gums normal Neck: no adenopathy, no carotid bruit, supple, symmetrical, trachea midline and thyroid not enlarged, symmetric, no tenderness/mass/nodules Back: symmetric, no curvature. ROM normal. No CVA tenderness. Lungs: clear to auscultation bilaterally Heart: regular rate and rhythm, S1, S2 normal, no murmur, click, rub or gallop Abdomen: soft, non-tender; bowel sounds normal; no masses,  no organomegaly Pulses: 2+ and symmetric Skin: Skin color, texture, turgor normal. No rashes or lesions Lymph nodes: Cervical, supraclavicular, and axillary nodes normal.  Assessment and Plan:  Sinusitis, acute maxillary Given chronicity of symptoms, development of facial pain and exam consistent with bacterial URI,  Will treat with empiric antibiotics, decongestants, and saline lavage.  Adding steroid nasal spray if not already taking.    DM (diabetes mellitus), type 2, uncontrolled w/neurologic complication Previously uncontrolled, requiring use of insulin pump, last A1c was 9.8 in December ,  DM managed by Dr Lavone Orn.  Since his bariatric inducedd weight loss his BS have been controlled with diet alon and his most recent A1c is still pending. Advised him to follow up with [name]..  Foot exam was normal today.    Hypertriglyceridemia He was advised to continue fenofibrate but has chosen to discontinue medication an dis scheduled for fasting labs tomorrow.  Low GI diet reviewed an dneeed for regular exercise    HTN (hypertension) Well controlled on current regimen. Renal function  reportedly  stable, no changes today.     Updated Medication  List Outpatient Encounter Prescriptions as of 01/29/2015  Medication Sig  . ALPRAZolam (XANAX) 0.5 MG tablet Take 0.5 mg by mouth 3 (three) times daily as needed for anxiety.  [name] amphetamine-dextroamphetamine (ADDERALL) 10 MG tablet Take 10 mg by mouth daily.  . sertraline (ZOLOFT) 100 MG tablet Take 1 tablet (100 mg total) by mouth daily. (Patient taking differently: Take 50 mg by mouth daily. )  . aspirin EC 81 MG tablet Take 81 mg by mouth daily.    . benzonatate (TESSALON) 200 MG capsule Take 1 capsule (200 mg total) by mouth 3 (three) times daily as needed for cough.  . carvedilol (COREG) 6.25 MG tablet Take 6.25 mg by mouth 2 (two) times daily with a meal.    . gabapentin (NEURONTIN) 300 MG capsule Take 300 mg by mouth at bedtime.  [name] levofloxacin (LEVAQUIN) 500 MG tablet Take 1 tablet (500 mg total) by mouth daily.  [name] lisinopril (PRINIVIL,ZESTRIL) 20 MG tablet Take 20 mg by mouth  daily.    . metFORMIN (GLUCOPHAGE) 500 MG tablet Take 500 mg by mouth daily with breakfast.  . omeprazole (PRILOSEC) 20 MG capsule Take 20 mg by mouth daily.    [name] UNABLE TO FIND Humulin R 500 U via pump.  . [DISCONTINUED] ALPRAZolam (XANAX) 0.5 MG tablet Take 1 tablet (0.5 mg total) by mouth at bedtime as needed.  . [DISCONTINUED] gemfibrozil (LOPID) 600 MG tablet Take 1 tablet (600 mg total) by mouth 2 (two) times daily before a meal. (Patient not taking: Reported on 01/29/2015)  . [DISCONTINUED] niacin (NIASPAN) 500 MG CR tablet Take 1 tablet (500 mg total) by mouth at bedtime. (Patient not taking: Reported on 01/29/2015)  . [DISCONTINUED] omega-3 acid ethyl esters (LOVAZA) 1 G capsule Take 4 capsules (4 g total) by mouth daily. (Patient not taking: Reported on 01/29/2015)   A total of 40 minutes was spent with patient more than half of which was spent in counseling patient on the above mentioned issues , reviewing and explaining recent labs and imaging studies done, and coordination of care.  No orders of the  defined types were placed in this encounter.    No Follow-up on file.

## 2015-01-29 NOTE — Telephone Encounter (Signed)
No problem. i do not call in antibiotics anyway

## 2015-01-29 NOTE — Patient Instructions (Signed)
I am treating you for bacterial sinusitis which is probably a  complication from your allergies causing   persistent sinus congestion.   I am prescribing an antibiotic (levaquin)   To manage the infection and the inflammation in your ear/sinuses.   I also advise use of the following OTC meds to help with your other symptoms.   Take generic OTC benadryl 25 mg at bedtime  for the drainage,  Sudafed PE 20 mg every 6 hours  if needed for congestion,   flush your sinuses twice daily with NeilMed sinus rinse  (do over the sink because if you do it right you will spit out globs of mucus)  Use benzonatate capsules   FOR THE  COUGH.    Please take a probiotic ( Align, Flora que or Culturelle) OR A GENERIC EQUIVALENT for three weeks since you are taking an  antibiotic to prevent a very serious antibiotic associated infection  Called clostridium dificile colitis that can cause diarrhea, multi organ failure, sepsis and death if not managed.

## 2015-01-29 NOTE — Telephone Encounter (Signed)
Patient scheduled at 4.15 . This came in 4.53 I did not see yesterday sorry for late notice.

## 2015-01-31 NOTE — Consult Note (Signed)
PATIENT NAME:  Sergio [name], Sergio [name] MR#:  [id number] DATE OF BIRTH:  08-12-1976  DATE OF CONSULTATION:  02/15/2013  CONSULTING PHYSICIAN:  A. Lavone Orn, MD  REFERRING PHYSICIAN: Phillips Climes, M.D.   PRIMARY ENDOCRINOLOGY: Dr. Arther Dames at [facility]  CHIEF COMPLAINT:  Pancreatitis.    HISTORY OF PRESENT ILLNESS: This is a 39 year old male with a history of type 2 diabetes, obesity, and familial hypertriglyceridemia admitted with abdominal pain and nausea and vomiting. The patient feels quite unwell and was unable to provide any of his history. I interviewed his wife. She states he reported the abdominal pain to her last night. He had one episode of emesis. On presentation, he was found to be hemodynamically stable with the epigastric pain. Labs revealed a glucose of 279, bicarbonate 25, AST 24, ALT 31, lipase greater than 3000, and triglycerides greater than 4000. CT scan of the abdomen revealed changes consistent with acute pancreatitis. The patient was admitted to the Intensive Care Unit and placed on IV fluids and IV insulin. He has been kept n.p.o. He still reports 10/10 epigastric pain.   The patient has a history of recurrent pancreatitis and was last hospitalized for this condition in November 2013. In the past, he typically improved after several days of bowel rest and IV insulin to treat his hypertriglyceridemia.   Since his last hospitalization, his prandial insulin was changed from NovoLog to Humalog 70/30 mix at a dose of 20 units at breakfast and 30 units at supper, in addition to continuing the Lantus and metformin. The patient has not been taking his 70/30 mix in several weeks. Wife believes that he has been depressed. He has not been checking his blood sugars. He has not had a recent followup with his endocrinologist.    PAST MEDICAL HISTORY: 1. Obesity.  2. Type 2 diabetes.  3. Familial hypertriglyceridemia.  4. OSA. 5. Recurrent acute pancreatitis.  6. Hypertension.   PAST  SURGICAL HISTORY: 1. Tonsillectomy.  2. Back surgery.  3. Vasectomy.   ALLERGIES: CODEINE AND IV CONTRAST.  HOME MEDICATIONS: 1. Lantus 60 units at bedtime.  2. Humalog mix 20 units at breakfast and 30 units at supper.  3. Metformin 1000 mg b.i.d.  4. Gemfibrozil 600 mg b.i.d.  5. Lovaza 4 grams daily.  6. Sertraline 100 mg daily.  7. Lisinopril 20 mg daily.  8. Alprazolam 0.5 mg at bedtime p.r.n.  9. Coreg 6.25 b.i.d.  SOCIAL HISTORY: The patient is married. He lives with his family including his wife and children. No tobacco use.   FAMILY HISTORY: The patient is adopted and is not knowledgeable about family history.   REVIEW OF SYSTEMS:  GENERAL: Difficult to obtain as the patient was in significant pain and did not want to be interviewed. In summary, he denies shortness of breath or cough. He reports poor appetite. Denies change in bowel habits. No recent fever.   PHYSICAL EXAMINATION:  VITAL SIGNS: Height 72 inches, weight 256 pounds, BMI 34.8, pulse 104 to 111, respirations 19, blood pressure 134/79, and oxygen saturation 88% on 2 liters nasal cannula, temperature 99.3.   GENERAL: Obese white male in no acute distress, appears uncomfortable.   HEENT: Extraocular movements are intact.   OROPHARYNX: Clear. Mucous membranes moist.   NECK: Supple. No appreciable thyromegaly.   CARDIAC: Tachycardia, regular rhythm. No carotid bruit.   PULMONARY: Clear bilaterally. No wheeze.   ABDOMEN: Diffuse tenderness, decreased bowel sounds. No distention.   EXTREMITIES: No edema is present.   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 No rash is apparent.   GENERAL: Alert, oriented.   LABORATORY DATA/RADIOLOGY:  As per history of present illness.   ASSESSMENT: A 40 year old male with obesity, familial hypertriglyceridemia, uncontrolled diabetes, recurrent pancreatitis, admitted for acute pancreatitis due to  hypertriglyceridemia. Likely cause is medication noncompliance.   RECOMMENDATIONS: 1. Agree with use of IV insulin. Triglycerides should slowly improve in the upcoming days.  2. Bowel rest and conservative care for acute pancreatitis as you were doing. 3. Anticipate that IV insulin can be stopped once his clinical condition improves and triglycerides are less than 1000. At that point, could use gemfibrozil twice daily and restart fish oil.  4. Long-term, counseling for depression could potentially help with medication compliance. He has had several hospitalizations this year for this condition; and despite his suffering, he still seems to not be making an effort in terms of self-care.   I will follow along with you.   ____________________________ A. Lavone Orn, MD ams:rw D: 02/15/2013 15:52:00 ET T: 02/15/2013 16:10:58 ET JOB#: [id number]  cc: A. Lavone Orn, MD, <Dictator> Dr. Arther Dames at [place]. Lavone Orn MD ELECTRONICALLY SIGNED 02/22/2013 8:19

## 2015-01-31 NOTE — Consult Note (Signed)
Brief Consult Note: Diagnosis: Acute Pancreatitis, Hypertriglyceridemia.   Consult note dictated.   Discussed with Attending MD.   Comments: Patient seen and examined. Acute pancreatitis, recurrent, secondary to Hypertriglyceridemia. TGs >4000, Lipase >3000. C/o abdominal pain, nausea. Currently on insulin drip which we agree with. Endocrine note read and appreciated as well. CT reviewed, no current evidence of abscess or pseudocyst. Continue IVF and NPO status for now. Stressed the importance of medication compliance. Full consult being dictated.  Electronic Signatures: Sherald Barge (PA-C)  (Signed 08-May-14 14:39)  Authored: Brief Consult Note   Last Updated: 08-May-14 14:39 by Sherald Barge (PA-C)

## 2015-01-31 NOTE — Consult Note (Signed)
Pt CC pancreatitis due to elevatged lipids.  Lipase down significantly, TGY still very high, on insulin drip, on 4L bicap.  Abd a littler less tender.Dr. Allen Norris on call but will not see unless you  call about a problem.  Electronic Signatures: Manya Silvas (MD)  (Signed on 09-May-14 18:29)  Authored  Last Updated: 09-May-14 18:29 by Manya Silvas (MD)

## 2015-01-31 NOTE — Consult Note (Signed)
PATIENT NAME:  Sergio [name], Sergio [name] MR#:  [id number] DATE OF BIRTH:  [birth year]  DATE OF CONSULTATION:  08/27/2013  REFERRING PHYSICIAN:  Fritzi Mandes, MD CONSULTING PHYSICIAN:  A. Lavone Orn, MD  CHIEF COMPLAINT: Acute pancreatitis.   HISTORY OF PRESENT ILLNESS: This is a 39 year old male with a history of type 2 diabetes and recurrent pancreatitis in the setting of familial hypertriglyceridemia who presented to the ED today with one day of abdominal pain and nausea and vomiting. He had decreased appetite for several days. No fevers. Initially found to have a triglyceride level greater than 4000, lipase level greater than 1000 and was hemodynamically stable. The patient was admitted and started on IV fluids.   He has type 2 diabetes which is managed with a [employer] insulin pump. I reviewed his insulin pump settings, and they have not recently been changed. He uses Humulin U-500 insulin in the  insulin pump, approximately a total of 150 units is average typical use. In reviewing his bolus history in the pump, I can see he did not have any insulin boluses from November 11 through November 16. Yesterday, he had 1 bolus and then this morning he had 1 bolus as well. He states he has been doing well with keeping to his diet and keeping up with his medications. He seems unaware that he had not bolused any insulin in the last 5 days.   PAST MEDICAL HISTORY: 1.  Type 2 diabetes.  2.  Familial hypertriglyceridemia.  3.  Recurrent acute pancreatitis.  4.  Obstructive sleep apnea.  5.  Morbid obesity.   PAST SURGICAL HISTORY: 1.  Tonsillectomy.  2.  Back surgery.  3.  Vasectomy.   ALLERGIES: CODEINE AND IV CONTRAST DYE.   SOCIAL HISTORY: The patient is married. He is employed. He has not used tobacco or alcohol.   FAMILY HISTORY: Not known, the patient is adopted.   OUTPATIENT MEDICATIONS: 1.  Humulin U-500 insulin in insulin pump.   2.  Sertraline 100 mg daily.  3.  Omeprazole 20 mg daily.  4.   Metformin 1000 mg b.i.d.  5.  Lovaza 1000 mg 4 capsules once daily.  6.  Lisinopril 20 mg daily.  7.  Gemfibrozil 600 mg b.i.d.  8.  Coreg 6.25 mg b.i.d.  9.  Aspirin 81 mg daily.  10.  Alprazolam 0.5 mg daily.   REVIEW OF SYSTEMS:  GENERAL: No weight loss. No fever.  HEENT: No blurred vision. No sore throat.  NECK: No neck pain. No dysphasia.  CARDIAC: No chest pain. No palpitation.  PULMONARY: No cough. No shortness of breath.  ABDOMEN:  Poor appetite, he has abdominal pain. He has nausea and vomiting.  EXTREMITIES: Denies leg swelling.  SKIN: Denies rash or recent skin changes.  ENDOCRINE:  No heat or cold intolerance.  GENITOURINARY: Denies dysuria or hematuria.  NEUROLOGIC: No tremor or recent falls.   PHYSICAL EXAMINATION: VITAL SIGNS: Height 70.9 inches, weight 280 pounds, BMI 39.1. Temp 98.5, pulse 80, respirations 20, blood pressure 108/70.  GENERAL: Well-developed, white male in no acute distress.    HEENT: EOMI.  Oropharynx is clear.  NECK: Supple.  CARDIAC: Regular rate and rhythm.  PULMONARY: Clear to auscultation bilaterally. No wheeze.  ABDOMEN: Diffusely tender, no guarding, decreased bowel sounds, no focal mass.  EXTREMITIES: No edema is present.  SKIN: Several xanthomata are present on the extremities. No new rash.   LABORATORY DATA: Initial blood sugar was 276. Repeat fingerstick blood sugar at 11:30 today was 153.  BUN 13, creatinine 0.85, sodium 134, potassium 3.5. EGFR greater than 60. Triglycerides greater than 4000. Lipase 1016. Calcium 8.6. AST 26, ALT 32, albumin 3.7. Hematocrit 36.6, WBC 9.3, platelets 346. UA notable for greater than 500 glucose, 1+ ketones, pH 5, protein 30.   RADIOLOGY: Right upper quadrant ultrasound showed no cholelithiasis or acute cholecystitis, markedly hyperechoic parenchyma of the liver concerning for severe hepatic steatosis, as well as hepatomegaly.   ASSESSMENT: 1.  Acute pancreatitis due to severe hypertriglyceridemia.  2.   Familial hypertriglyceridemia, uncontrolled.  3.  Type 2 diabetes.  4.  Morbid obesity.  5.  Hepatic steatosis.   RECOMMENDATIONS: 1.  Suspect cause of severe hypertriglyceridemia may be due in part to recent worsening of blood sugars in setting of inadequate use of insulin. In reviewing the bolus history, he has not been bolusing insulin for the last several days. I discussed this with him and he seems unaware of it. I will have to review his understanding of adequate use of the insulin pump.  2.  Continue use of insulin pump at this time.  3.  Continue fluids as you are.  4.  Continue bowel rest. Recommend he be kept n.p.o. until abdominal pain is improved and clinical status permits.  5.  Could restart gemfibrozil and metformin, likely tomorrow if tolerating p.o.   Thank you for the kind request for consultation. I will follow along with you.   ____________________________ A. Lavone Orn, MD ams:dmm D: 08/27/2013 21:33:36 ET T: 08/27/2013 21:55:18 ET JOB#: [id number]  cc: A. Lavone Orn, MD, <Dictator> [name] MD ELECTRONICALLY SIGNED 09/08/2013 10:16

## 2015-01-31 NOTE — Consult Note (Signed)
CC: pancreatitis, hypertriglyceridemia, diabetes, depression, severe non compliance.  Spoke to wife, recommend psy consult when he is improved in his abd pain and is near discharge.  He was seeing a therapist and doing better but slacked off recently.  Nothing new to add to your management.  See PA note for our consult.  Electronic Signatures: Manya Silvas (MD)  (Signed on 08-May-14 17:36)  Authored  Last Updated: 08-May-14 17:36 by Manya Silvas (MD)

## 2015-01-31 NOTE — Consult Note (Signed)
PATIENT NAME:  Sergio [name], Sergio [name] MR#:  [id number] DATE OF BIRTH:  [birth year]  DATE OF CONSULTATION:  02/17/2013  CONSULTING PHYSICIAN:  Lindsee Labarre K. Sebron Mcmahill, MD  SUBJECTIVE:  The patient was seen in consultation for pancreatitis and not taking medications and noncompliant with medications, severely depressed.  The patient is a 39 year old white male who is employed as a Occupational hygienist for [employer].  The patient is married for 10 years and lives with his wife and their 3 children.    CHIEF COMPLAINT: "I came here for pancreatitis."  According to information obtained from the charts and consult, the patient is noncompliant with medications and does not take his medications for diabetes and comes back as readmission.    PAST PSYCHIATRIC HISTORY: No previous history of inpatient on psychiatry.  No history of suicide attempts.  He is being followed by Dr. Rexene Edison at [facility].  Last appointment was a month ago, next appointment is to be made.  The patient reports that he has been stabilized on Zoloft 15 mg, but he is noncompliant with his medications.    ALCOHOL AND DRUGS:  Denied.   MENTAL STATUS EXAM:  The patient is alert and oriented to place, person and time, aware of the situation that brought him for admission.  Affect is appropriate with his mood, which is low and down, though he denies feeling depressed, denies feeling hopeless and helpless, denies feeling worthless and useless.  He denies any ideas or plans to hurt himself or others.  No psychosis.  He does not appear to know the actual consequences of him not taking his medication, though judgment and insight appear to be guarded.  Memory is intact.  Cognition is intact.    IMPRESSION: Mood disorder secondary to medical illness, diabetes mellitus.   RECOMMENDATIONS:   1.  Continue Zoloft 100 mg p.o. per day.     2.  Recommend counseling on a 1 on 1 basis so that he may get insight into his behavior and  learn consequences and so that he will have better insight into his illness and will take better care of himself and will be compliant with medications.     ____________________________ Wallace Cullens. Franchot Mimes, MD skc:cb D: 02/17/2013 22:23:57 ET T: 02/18/2013 15:41:33 ET JOB#: [id number]  cc: Arlyn Leak K. Franchot Mimes, MD, <Dictator> Dewain Penning MD ELECTRONICALLY SIGNED 02/18/2013 19:56

## 2015-01-31 NOTE — Consult Note (Signed)
I will sign off.  REconsult if needed.  Electronic Signatures: Manya Silvas (MD)  (Signed on 13-May-14 07:44)  Authored  Last Updated: 13-May-14 07:44 by Manya Silvas (MD)

## 2015-01-31 NOTE — H&P (Signed)
PATIENT NAME:  Sergio [name], Sergio [name] MR#:  [id number] DATE OF BIRTH:  Jul 11, 1976  DATE OF ADMISSION:  02/15/2013  REFERRING PHYSICIAN:  Dr. Charlesetta Ivory.   PRIMARY CARE PHYSICIAN:  Dr. [name].   PRIMARY ENDOCRINOLOGY:  Dr. Arther Dames at [facility].   PRIMARY GASTROENTEROLOGY PHYSICIAN:  Dr. Vira Agar.  CHIEF COMPLAINT:  Abdominal pain, nausea, vomiting.   HISTORY OF PRESENT ILLNESS:  This is a 39 year old male with significant past medical history of hypertriglyceridemia, with admissions for recurrent pancreatitis, type 2 diabetes on insulin, hypertension, the patient's primary endocrinology at [facility], the patient has history of multiple admissions in the past due to recurrent pancreatitis from his hypertriglyceridemia, the patient presents today with symptoms developed at 9:00 p.m. including abdominal pain, nausea, vomiting, and sharp pain in the epigastric area, started at 9, the patient reports these symptoms resemble the previous episodes of pancreatitis he had, last time the patient was in November of last year at [facility] for similar episodes, where he was seen by endocrinology Dr. Gabriel Carina where he was started on insulin drip to lower his triglycerides, the patient was in significant pain upon presentation to ED, where he was given multiple doses of Dilaudid which did control his abdominal pain, the patient had elevated lipase level at 3000, his glucose level was at 279, the patient's LFTs were within normal limits, the patient's hemoglobin was unmeasurable secondary to his significant lipemia, as well patient reports he has been compliant with his medication where he is on Lovaza, and gemfibrozil for his hypertriglyceridemia, the patient reports he did develop eruptive xanthoma over the last two months.   PAST MEDICAL HISTORY: 1.  Familial hypertriglyceridemia.  2.  Type 2 diabetes.  3.  Obesity.  4.  Obstructive sleep apnea.  5.  Multiple admissions for acute pancreatitis, last one in  November 2013.  6.  Hypertension.   PAST SURGICAL HISTORY:  1.  Tonsillectomy.  2.  Back surgery.  3.  Vasectomy.   ALLERGIES TO MEDICATIONS:   1.  IV CONTRAST.  2.  CODEINE.   HOME MEDICATIONS: 1.  Lisinopril 20 mg oral daily.  2.  Sertraline 100 mg oral daily.  3.  Lantus 60 units subQ at bedtime.  4.  Metformin 1 gram oral 2 times a day.  5.  NovoLog FlexPen 12 units subQ 3 times a day before meals, as well NovoLog sliding scale.  6.  Gemfibrozil 600 mg oral 2 times a day.  7.  Alprazolam 0.5 mg at bedtime as needed for sleep.  8.  Coreg 6.25 mg oral every 12 hours.  9.  Lovaza 1000 mg oral 4 capsules daily.  10.  Omeprazole 20 mg oral daily.   SOCIAL HISTORY:  The patient lives at home with his wife.  No history of smoking, alcohol or drug use.   FAMILY HISTORY:  The patient is adopted, does not know anything about his family.   REVIEW OF SYSTEMS: CONSTITUTIONAL:  Significant for fatigue, weakness.  Denies any weight gain, weight loss, fever or chills.  EYES:  Denies blurry vision, double vision, pain, inflammation.  EARS, NOSE, THROAT:  Denies tinnitus, ear pain, hearing loss, allergy, epistaxis or discharge.  RESPIRATORY:  Denies cough, wheezing, hemoptysis, dyspnea.  CARDIOVASCULAR:  Denies chest pain, orthopnea, edema, arrhythmia, palpitations, syncope.  GASTROINTESTINAL:  Has complaints of nausea, vomiting, sharp abdominal pain.  Denies hematemesis, melena, bright red blood per rectum or melena or history of gastric ulcer or jaundice, diarrhea or constipation.  ENDOCRINE:  Denies polyuria, polydipsia, heat or cold intolerance.  Has history of diabetes, on insulin.  HEMATOLOGY:  Denies anemia, easy bruising, bleeding diathesis.  INTEGUMENTARY:  Denies rash or acne, has erupted xanthoma developed over the last two months.  MUSCULOSKELETAL:  Denies any arthritis, cramps, gout or limited activity.  NEUROLOGIC:  Denies CVA, TIA, seizures, vertigo, ataxia, dementia, or  headache.  PSYCHIATRIC:  Has history of anxiety and depression.  Denies schizophrenia, substance or alcohol abuse.   PHYSICAL EXAMINATION: VITAL SIGNS:  Temperature 98.3, pulse 96, respiratory rate 21, blood pressure 119/75, saturating 98% on room air.  GENERAL:  Obese male in mild distress due to pain.  HEENT:  Head atraumatic, normocephalic.  Pupils are equal, reactive to light.  Pink conjunctivae.  Anicteric sclerae.  Oropharynx clear without any erythema or exudate.  NECK:  Supple.  No thyromegaly.  No JVD.  No carotid bruits.  No lymphadenopathy.  LUNGS:  The patient has good air entry bilaterally.  Clear to auscultation.  No wheezing or crackles, use of accessory muscles.  CARDIOVASCULAR:  S1, S2 heard.  Regular rate and rhythm.  No rubs, murmurs or gallops.  ABDOMEN:  Tenderness to palpation in the epigastric area, but no rebound, no guarding.  No hepatosplenomegaly could be appreciated.  Bowel sounds auscultated, mildly hypoactive.  EXTREMITIES:  No edema.  No clubbing.  No cyanosis.  +2 dorsalis pedis pulses felt bilaterally.  SKIN:  No acne.  No rash.  Has eruptive xanthoma lesions in the upper and lower extremities.  LYMPHATICS:  No cervical lymphadenopathy.   NEUROLOGIC:  Cranial nerves grossly intact.  No focal motor or sensory deficits.  PSYCHOLOGICAL:  Awake, alert x 3.  Intact judgment and insight.   PERTINENT LABORATORY DATA:  Glucose 279, BUN 11, creatinine 0.83, sodium 136, potassium 3.6, chloride 101, CO2 25, total protein 8, alkaline phosphatase 54, AST 24, ALT 31, total bilirubin 0.3.  White blood cells 7.3.  Hemoglobin cannot be determined secondary to his significant lipemia.  Hematocrit 34.3, platelets 302.    ASSESSMENT AND PLAN: 1.  Abdominal pain, nausea, vomiting, this is due to acute recurrent pancreatitis secondary to his familial hypertriglyceridemia, the patient will be admitted, will be kept nothing by mouth except meds, will be started on aggressive IV fluid  hydration, will receive a total of 3 liters bolus and he will be kept on 100 mL per hour, we will continue to follow his lipase level, as well, we will recheck his lipid panel, we will have him on as needed pain medicine and nausea medicine, we will try all measures to lower the patient's hypertriglyceridemia as it is the main culprit for his recurrent pancreatitis, the patient will be started on IV insulin drip for that, as we will consult endocrinology service.  2.  Diabetes mellitus.  The patient will be started on insulin drip.  3.  Hypertension.  We will hold his by mouth meds for now until he is more stable.  4.  Depression.  We will hold sertraline for now until he is more stable.  5.  Hypertriglyceridemia, we will continue patient on Lovaza and gemfibrozil, and we will have him on insulin drip.  6.  Deep vein thrombosis prophylaxis.  SubQ heparin.  7.  Gastrointestinal prophylaxis.  On PPI.  8.  CODE STATUS:  The patient is FULL CODE.   Total time spent on admission and patient care 55 minutes.    ____________________________ Albertine Patricia, MD dse:ea D: 02/15/2013 03:14:38 ET T: 02/15/2013  04:08:11 ET JOB#: [id number]  cc: Albertine Patricia, MD, <Dictator> Menashe Kafer Graciela Husbands MD ELECTRONICALLY SIGNED 02/21/2013 7:30

## 2015-01-31 NOTE — Discharge Summary (Signed)
PATIENT NAME:  Sergio [name], Sergio [name] MR#:  [id number] DATE OF BIRTH:  1976-01-14  DATE OF ADMISSION:  02/15/2013  DATE OF DISCHARGE:   02/20/2013  CONSULTANTS:  Dr. Franchot Mimes from psychiatry, Dr. Vira Agar from GI, Dr. Gabriel Carina from endocrinology.   CHIEF COMPLAINT:  Abdominal pain, nausea and vomiting.   PRIMARY CARE PHYSICIAN:   Dr. Derrel Nip.   DISCHARGE DIAGNOSES: 1.  Acute pancreatitis, likely secondary to severe hypertriglyceridemia.  2.  Acute respiratory failure.  3.  Obstructive sleep apnea on CPAP.  4.  Uncontrolled diabetes.  5.  Familial hypertriglyceridemia.  6.  Obesity.  7.  History of recurrent pancreatitis.  8.  Hypertension.  9.  Depression.  10.  Noncompliance with medications and diet.   DISCHARGE MEDICATIONS:  Gemfibrozil 600 mg 2 times a day, metformin 1000 mg 2 times a day, omeprazole 20 mg daily, Lovaza 4 grams daily, carvedilol 6.25 mg every 12 hours, sertraline 100 mg daily, alprazolam 0.5 mg 1 tab once a day at bedtime as needed for sleep, lisinopril 20 mg daily, Lantus 60 units at bedtime, NovoLog FlexPen 2 units 4 times a day as needed for glucose level above 150, NovoLog FlexPen 20 units a.c. t.i.d. before meals, Percocet 325/5 mg 1 tab every 6 hours as needed for pain.   DIET:  Low fat, ADA, low cholesterol diet.   ACTIVITY:  As tolerated.   DISCHARGE FOLLOWUP:  Please follow with PCP and Dr. Gabriel Carina and psychiatry within 1 to 2 weeks.   DISPOSITION:  Discharge to home.   CODE STATUS: Full code.   SIGNIFICANT LABORATORIES: Initial triglycerides was greater than 4000, initial lipase greater than 3000. BUN on arrival 11, creatinine 0.83. HDL 13, total cholesterol 409. LFTs on arrival within normal limits. Initial WBC 7.3 and hematocrit was 34. Echocardiogram on May 12 showed a normal EF, borderline concentric LVH. Last triglycerides, today, of 812. Hemoglobin A1c was 10.2. CAT scan abdomen and pelvis without contrast showed pancreatitis, severe changes without any evidence  of abscess, pseudocyst or hemorrhage. X-ray of the chest showed shallow inspiration, which accentuates the lung findings.   HISTORY OF PRESENT ILLNESS AND HOSPITAL COURSE: For full details of H and P, please see the dictation on May 08 by Dr. Waldron Labs, but briefly, this is a 39 year old obese Caucasian male with a history of hypertriglyceridemia, which is familial; recurrent pancreatitis, diabetes, who presented with the above chief complaint.  The patient stated that he had been noncompliant with medications and diet and came in with acute pancreatitis and was admitted to the hospitalist service with aggressive IV fluid resuscitation, as his lipase was greater than 3000. The patient received 3 liter of boluses and was started on IV fluids with opiates and Zofran for symptomatic relief. He was also started on an insulin drip and endocrine was consulted. The patient apparently has had depression and been noncompliant with diet and medications, and his wife stated that he also has noncompliance with diet and eats anything he wants. His hemoglobin A1c was greater than 10. Here, he was started on insulin drip, as his triglycerides were significantly elevated and slowly came down with insulin drip. He did drop his sats having acute respiratory failure, which was likely secondary to not only his underlying obstructive sleep apnea and CPAP needs, which likely contributed, but also possible acute lung injury from pancreatic inflammation and required oxygen; however, he is off of it now. For his obstructive sleep apnea, his CPAP was continued. The patient was transitioned onto his outpatient  regimen of insulin, gemfibrozil and Lovaza after discontinuation of the insulin drip, and at this point, his diet has been bumped up. He will need psyche eval as an outpatient for his noncompliance and food addiction, which the wife thinks is possibly contributing to this. Furthermore, his depression has to be addressed as an  outpatient. He did have bouts of hypokalemia, which was repleted with p.o.  and IV potassium.    TOTAL TIME SPENT: 33 minutes.    ____________________________ Vivien Presto, MD sa:dmm D: 02/20/2013 18:08:36 ET T: 02/20/2013 19:33:51 ET JOB#: [id number]  cc: Vivien Presto, MD, <Dictator> [name], MD A. Lavone Orn, MD K[facility] MD ELECTRONICALLY SIGNED 03/13/2013 12:27

## 2015-01-31 NOTE — Consult Note (Signed)
Chief Complaint and History:  Referring Physician Dr. Waldron Labs   Chief Complaint hypertriglyceridemia-induced pancreatitis   Allergies:  Codeine: Hives  Assessment/Plan:  Assessment/Plan This is a 39 yo M who was seen in consultation for the above complaint. Pt known to me from prior hospitalizations. has type 2 diabetes, familial hypertriglyceridemia, and recurrent pancreatitis. He's not been taking his insulin. Wife claims he's been quite depressed. He had 1 day of abd pain and N/V. Chart reviewed, pt & wife were interviewed and pt was examined. Now on IV insulin drip and kept NPO.  A/ Type 2 diabetes, uncontrolled Familial hypertriglyceridemia Recurrent acute pancreatitis due to hyperTg Morbid obesity Medication non-compliance Depression  P/ Agree with current plan of IV insulin to improve Tg. Would continue with it until Tg level at least <1000 and his clinical condition much improved. Check Tg level q1-2 days. Agree with conservative care with IVF and NPO for pancreatitis. Once clinical condition improved and he is ready for a diet, we can transition to SQ insulin and restart gemfibrozil and fish oil.  I will follow with you. Full consult will be dictated.   Electronic Signatures: Judi Cong (MD)  (Signed [phone] 14:02)  Authored: Chief Complaint and History, ALLERGIES, Assessment/Plan   Last Updated: 08-May-14 14:02 by Judi Cong (MD)

## 2015-01-31 NOTE — Consult Note (Signed)
Chief Complaint and History:  Referring Physician Dr. Posey Pronto   Chief Complaint hypertriglyceride-induced pancreatitis   Allergies:  Codeine: Hives  Assessment/Plan:  Assessment/Plan 39 yo M seen in consultation for acute pancreatitis due to severe hypertriglyceridemia. This is recurrent acute pancreatitis, as he has had at least 3 episodes in the last year for the same. He has type 2 diabetes managed with a [employer] insulin pump. He presented with several days feeling unwell, and 1-2 days abd pain and N/V. Found to have Tg >4000, lipase >1000, and BG 276. In reviewing his pump, he had not bolused insulin from 11/11 - yesterday afternoon.   A/ 1. Acute pancreatitis 2. Severe hypertriglyercidemia 3. Uncontrolled diabetes  P/ Suspect high Tg may be due to non-compliance with his insulin pump as well as potentially with oral medications. Recommend continuing insulin pump use, as you are with RN monitoring of FSBS qACHS. Continue IVF and NPO> Expect that after a few days of bowel rest his condition will improve.  Full consult to be dictated,   Case Discussed With patient, attending physician   Electronic Signatures: Judi Cong (MD)  (Signed [phone] 21:23)  Authored: Chief Complaint and History, ALLERGIES, Assessment/Plan   Last Updated: 17-Nov-14 21:23 by Judi Cong (MD)

## 2015-01-31 NOTE — Consult Note (Signed)
PATIENT NAME:  Sergio [name], DOBEK MR#:  [id number] DATE OF BIRTH:  March 24, 1976  DATE OF CONSULTATION:  02/15/2013  REFERRING PHYSICIAN:  Loletha Grayer, MD CONSULTING PHYSICIAN:  Gaylyn Cheers, MD /  Corky Sox. Nishka Heide, PA-C  REASON FOR CONSULTATION: Acute pancreatitis.   HISTORY OF PRESENT ILLNESS: This is a pleasant 39 year old gentleman with a past medical history significant for recurrent acute pancreatitis caused by hypertriglyceridemia. Most recently was in November 2013 where he required admission and an insulin drip. He does admit that he has not been as compliant with his medications as he should be and has not been taking his insulin. Per medical records, his family members have reported some recent depression as well. Upon initial presentation, he was complaining of severe abdominal pain with accompanying nausea and vomiting. Upon further work-up, his triglycerides were found to be greater than 4000 and his lipase level was found to greater than 3000. Of note, his LFTs were within normal limits. He does not have an elevated white count. A CT of the abdomen and pelvis was obtained with oral contrast to evaluate for any necrosis and pseudocyst and findings were negative for abscess, pseudocyst or hemorrhage. There was diffuse pancreatic and peripancreatic edema however consistent with acute pancreatitis. He was admitted to the CCU and started on an insulin drip. Endocrine was also consulted and there note is appreciated. Since being admitted, the patient feels very mild improvement, but still has complaints of pretty severe abdominal pain. No fever or chills. No chest pain or shortness of breath. No further vomiting.   ALLERGIES: IV CONTRAST AND CODEINE.   HOME MEDICATIONS: Lantus, metformin, NovoLog, gemfibrozil, sertraline, lisinopril, alprazolam, Coreg, Lovaza and omeprazole.   PAST SURGICAL HISTORY: Tonsillectomy, back surgery and vasectomy.   PAST MEDICAL HISTORY: Diabetes mellitus type 2 on  insulin as well as oral, hypertriglyceridemia causing recurrent episodes of acute pancreatitis requiring multiple admissions, obstructive sleep apnea, hypertension and obesity.   SOCIAL HISTORY: Denies any current alcohol, tobacco or illicit drug use.   FAMILY HISTORY: The patient was adopted.   REVIEW OF SYSTEMS: A 10 system review was obtained on the patient. All pertinent positives are mentioned above and otherwise negative.   PHYSICAL EXAMINATION:  VITAL SIGNS: Blood pressure 131/71, heart rate 108, respirations 24, temperature 99.3, pulse ox 89%.  GENERAL: This is a pleasant 39 year old gentleman resting quietly and comfortably in the CCU, currently on A breathing machine, in mild distress.  HEAD: Atraumatic, normocephalic.  NECK: Supple. No lymphadenopathy noted. The patient is alert.  LUNGS: Respirations are even and unlabored, clear to auscultation in bilateral anterior lung fields.  HEART:  Regular rate and rhythm. S1 and S2 noted. The patient has been having intermittent sinus tachycardia.  ABDOMEN: Soft, significantly tender to even light palpation with some degree of guarding with this. No rebound. Normoactive bowel sounds are noted in all 4 quadrants.  RECTAL: Deferred.  EXTREMITIES: Negative for lower extremity edema, 2+ pulses noted bilaterally.  PSYCHIATRIC: Appropriate mood and affect.   LABORATORY DATA: White blood cells 7.3, hematocrit 34.3 and platelets 302. Sodium 136, potassium 3.6, BUN 11, creatinine 0.83 and glucose is 279. LFTs are within normal limits. Lipase greater than 3000.  Total cholesterol 409.  Glucose 279.  BMI is 34.8.  Triglycerides were greater than 4000.   IMAGING: CT of the abdomen and pelvis with oral contrast only was obtained. Diffuse pancreatic and peripancreatic edema was noted consistent with acute pancreatitis. There was no evidence of a pseudocyst, abscess or hemorrhage.  ASSESSMENT: 1.  Acute pancreatitis, this is recurrent. Likely the  etiology is hypertriglyceridemia as currently his triglycerides are greater than 4000. He does admit noncompliance with his home medications.  2.  Abdominal pain, likely secondary to above.  3.  Type 2 diabetes mellitus.   PLAN: I have discussed this patient's case in detail with Dr. Gaylyn Cheers who is involved in the development of the patient's plan of care. At this time, we did read and appreciate endocrinology's consult as well and we do agree with the patient being placed on an insulin drip. We do recommend continuing conservative management with IV hydration as well as keeping him n.p.o. status. We did stress the importance of compliance with his medications as the likely etiology is his severe hypertriglyceridemia. We did review the CT scan and there does not appear to be any evidence of pseudocyst, abscess or hemorrhage. However, we did discuss this risk of long-term damage if he continues to have episodes of acute pancreatitis. He verbalized understanding. Continue symptomatic management with pain control and antiemetics. We will continue to monitor this patient throughout hospitalization and make further recommendations per clinical course.   Thank you so much for this consultation and for allowing [place] to participate in the patient's plan of care. ____________________________ Corky Sox. Ysidro Ramsay, PA-C kme:sb D: 02/15/2013 14:53:06 ET T: 02/15/2013 15:07:35 ET JOB#: [id number]  cc: Corky Sox. Zamari Vea, PA-C, <Dictator> Benedict PA ELECTRONICALLY SIGNED 02/16/2013 11:51

## 2015-01-31 NOTE — Discharge Summary (Signed)
PATIENT NAME:  Sergio [name], Sergio [name] MR#:  [id number] DATE OF BIRTH:  1975/12/12  DATE OF ADMISSION:  08/27/2013 DATE OF DISCHARGE:  08/30/2013  PRESENTING COMPLAINT: Abdominal pain.   DISCHARGE DIAGNOSES:  1. Acute recurrent pancreatitis.  2. Familial hypertriglyceridemia.  3. Type 2 diabetes, on insulin pump.  4. Obesity.   CONDITION ON DISCHARGE: Fair.   MEDICATIONS:  1. Gemfibrozil 600 mg b.i.d.  2. Metformin 1000 mg b.i.d.  3. Omeprazole 20 mg p.o. daily.  4. Lovaza 1000 mg 4 capsules daily.  5. Carvedilol 6.25 b.i.d.  6. Sertraline 100 mg daily.  7. Alprazolam 0.5 mg 1 tablet once a day at bedtime as needed.  8. Lisinopril 20 mg p.o. daily.  9. Humulin R recombinant 500 units through insulin pump as before.  10. Aspirin 81 mg daily.  11. Gabapentin 300 mg daily.  12. Percocet 5/325 one pill q.6 hours as needed.   FOLLOWUP:  1. With Dr. Derrel Nip in 2 to 4 weeks.  2. With Dr. Gabriel Carina as needed.   DIET: Low-fat, low-cholesterol, ADA 1800 calorie diet.   ENDOCRINOLOGY CONSULTATION: With Dr. Gabriel Carina.   LABORATORIES: Lipase 339. Basic metabolic panel within normal limits except glucose of 155. White count was 8.7. UA negative for UTI. Ultrasound of the abdomen shows severe hepatic steatosis. Triglycerides on admission were more than 4000.   BRIEF SUMMARY OF HOSPITAL COURSE: Sergio [name] is a 39 year old, obese, Caucasian gentleman with history of recurrent pancreatitis and history of diabetes on insulin pump, familial hyperlipidemia. Comes in with:  1. Acute recurrent pancreatitis secondary to his familial hypertriglyceridemia and possible this time due to malfunction on his insulin pump. The patient was kept n.p.o. His lipase was 1000 on admission, went up to more than 4000, came down to 336 prior to discharge. He was given IV p.r.n. Dilaudid and p.o. Percocet. Symptoms improved. He was able to tolerate p.o. diet prior to discharge.  2. Type 2 diabetes: Dr. Gabriel Carina saw the patient.  Apparently, his insulin pump was having a malfunction which was adjusted. Sugars much better. Symptoms improved for his pancreatitis, and the patient will continue his insulin pump as before. Check his sugars and follow up with Dr. Gabriel Carina.  3. History of familial hypertriglyceridemia: On Lovaza and gemfibrozil.  4. Hypertension: On Coreg and lisinopril.   Hospital stay otherwise remained stable. The patient remained a FULL CODE.   TIME SPENT: 40 minutes.   ____________________________ Hart Rochester Posey Pronto, MD sap:gb D: 08/31/2013 07:23:41 ET T: 08/31/2013 07:31:22 ET JOB#: [id number]  cc: Ross Bender A. Posey Pronto, MD, <Dictator> A. Lavone Orn, MD Ilda Basset MD ELECTRONICALLY SIGNED 09/03/2013 14:16

## 2015-01-31 NOTE — H&P (Signed)
PATIENT NAME:  Sergio [name], PAPARELLA MR#:  [id number] DATE OF BIRTH:  1976/04/08  DATE OF ADMISSION:  08/27/2013  PRIMARY CARE PHYSICIAN: Dr. Derrel Nip.   CHIEF COMPLAINT: Abdominal pain.   HISTORY OF PRESENT ILLNESS: Sergio [name] is a 39 year old Caucasian gentleman with past medical history of recurrent pancreatitis secondary to familial hypertriglyceridemia, history of type 2 diabetes, on insulin pump, morbid obesity who comes to the Emergency Room with abdominal pain, which has been going on since yesterday. The patient says his abdominal pain at this time is 3/10. He received some Dilaudid in the Emergency Room. I also talked with his wife, who says the patient has been following up his diet at home, but does not know, "whether he is noncompliant at times with food" which the patient tells me he once in a while he tries to eat fatty food. The patient is hemodynamically stable, comes in with epigastric pain. He was found to have glucose of 276 and his lipase is 1000. Ultrasound of the abdomen shows hepatic steatosis. He denies any nausea or vomiting. He is being admitted for acute recurrent pancreatitis.   PAST MEDICAL HISTORY:  1. Obesity. 2. Type 2 diabetes, on insulin pump.  3. Familial  hypertriglyceridemia.  4. Obstructive sleep apnea.  5. Recurrent acute pancreatitis due to elevated triglycerides.  6. Hypertension.   PAST SURGICAL HISTORY: Tonsillectomy, back surgery and vasectomy.   ALLERGIES: CODEINE AND IV CONTRAST DYE.   SOCIAL HISTORY: He is married, lives with his wife and children. No tobacco or alcohol use.   MEDICATIONS: 1. Sertraline 100 mg p.o. daily.  2. Omeprazole 20 mg daily.  3. Metformin 1000 mg b.i.d.  4. Lovaza 1000 mg 4 capsules once a day.  5. Lisinopril 20 mg p.o. daily.  6. Humulin R insulin pump.  7. Gemfibrozil 600 mg 1 tablet 2 times a day.   8. Carvedilol 6.25 b.i.d.  9. Aspirin 81 mg daily.  10. Alprazolam  0.5 mg once a day at bedtime.   FAMILY HISTORY: The  patient is adopted does not know about his family.   REVIEW OF SYSTEMS:  CONSTITUTIONAL: No fever, fatigue, weakness.  EYES: No blurred or double vision.  ENT: No tinnitus, ear pain, hearing loss.  RESPIRATORY: No cough, wheeze, hemoptysis.  CARDIOVASCULAR: No chest pain, orthopnea, edema. Positive for hypertension.  GASTROINTESTINAL: Positive for epigastric pain. No nausea, vomiting, GERD or rectal bleed.  GENITOURINARY: No dysuria, hematuria and polyuria.  ENDOCRINE: No polyuria, nocturia or thyroid problems.  HEMATOLOGY: No anemia or easy bruising or bleeding disorders.  SKIN: No acne or rash.  MUSCULOSKELETAL: No arthritis or back pain or gout.  NEUROLOGIC: No CVA, TIA, tremors or vertigo.  PSYCHIATRIC: No anxiety. Positive for mild depression.   All other systems reviewed and negative.   PHYSICAL EXAM: GENERAL: The patient is awake, alert, oriented x 3, not in acute distress.  VITAL SIGNS: Afebrile, pulse is 89. Blood pressure is 128/85. Pulse oximetry is 92% on room air.  HEENT: Atraumatic, normocephalic. Pupils PERRLA  EOMI  intact. Oral mucosa is moist.  NECK: Supple. No JVD. No carotid bruit.  LUNGS: Clear to auscultation bilaterally. No rales, rhonchi, respiratory distress or labored breathing.  CARDIOVASCULAR: Both heart sounds are normal. Rate, rhythm is regular. PMI not lateralized. Chest is nontender.  EXTREMITIES: Good pedal pulses, good femoral pulses. No lower extremity edema.  ABDOMEN: Soft and there is mild tenderness present up in the epigastric area. No guarding, rigidity or any abnormal mass felt. Normal bowel  sounds.  EXTREMITIES: Good pedal pulses, good femoral pulses. No lower extremity edema.  NEUROLOGIC: Grossly intact cranial nerves II through XII. No motor or sensory deficits.  PSYCHIATRIC: The patient is awake, alert, oriented x 3.  SKIN: Warm and dry. I do not see any xanthomas on his arms.   Urinalysis negative for urinary tract infection.    Abdominal ultrasound shows severe hepatic steatosis. No cholelithiasis or acute cholecystitis.    Hematocrit is 36.6,  MCV is 78. White count is 9.3.   Comprehensive metabolic panel within normal limits. Glucose is 276, sodium is 134. Lipase is 1016.   Triglycerides are more than 4000.   ASSESSMENT AND PLAN: A 39 year old [place] with history of type 2 diabetes, on insulin pump, hypertension and hyperlipidemia, comes in with:  1. Abdominal pain and nausea, which appears to be due to acute recurrent pancreatitis secondary to his familial hypertriglyceridemia. We will admit the patient to the medical floor. Keep him n.p.o, give IV fluids. Give p.r.n. Dilaudid around-the-clock. I will let  the patient managae his insulin drip, insulin pump and have endocrinology see the patient. We will  continue to check lipase, keep the patient n.p.o. except meds and ice chips, and start diet once his pain is under control.  2. Type 2 diabetes. The patient will be continued on his insulin pump with Accu-Chek a.c. and at bedtime. I will hold off on metformin for now.  3. Hypertension. We will continue his oral medications.  4. Depression. We will continue sertraline and Xanax.  5. Hypertriglyceridemia. The patient is already on Lovaza and gemfibrozil.  6. Deep venous thrombosis prophylaxis. The patient is ambulatory.  7. Further work-up according to the patient's clinical course.   Hospital admission plan was discussed with the patient.  The patient's wife was present in the room.  TIME SPENT: 50 minutes.    ____________________________ Hart Rochester Posey Pronto, MD sap:sg D: 08/27/2013 10:48:32 ET T: 08/27/2013 11:30:14 ET JOB#: [id number]  cc: Billiejo Sorto A. Posey Pronto, MD, <Dictator> [name], MD   Ilda Basset MD ELECTRONICALLY SIGNED 09/03/2013 14:16

## 2015-02-01 DIAGNOSIS — J0100 Acute maxillary sinusitis, unspecified: Secondary | ICD-10-CM | POA: Insufficient documentation

## 2015-02-01 DIAGNOSIS — Z9884 Bariatric surgery status: Secondary | ICD-10-CM | POA: Insufficient documentation

## 2015-02-01 DIAGNOSIS — Z9049 Acquired absence of other specified parts of digestive tract: Secondary | ICD-10-CM | POA: Insufficient documentation

## 2015-02-01 NOTE — Discharge Summary (Signed)
PATIENT NAME:  Sergio [name], Sergio [name] MR#:  [id number] DATE OF BIRTH:  1975/10/18  DATE OF ADMISSION:  03/16/2014  DATE OF DISCHARGE:  03/24/2014  ADMISSION DIAGNOSIS: Acute pancreatitis.  DISCHARGE DIAGNOSES: 1.  Acute pancreatitis with possible pseudocyst. 2.  Hypertriglyceridemia.  3.  Uncontrolled diabetes, with A1c of 9.7.  4.  History of OSA.  5.  Hypertension.   CONSULTATIONS:  1.  Dr. Gabriel Carina 2.  GI.   Please refer to the interim summary dictated already on June 13 by Dr. Tressia Miners.   HOSPITAL COURSE: This is a very pleasant, 39 year old male with a history of pancreatitis in the past, and diabetes, who presented with abdominal pain, found to have acute pancreatitis. For further details, please refer to the H and P and also the dictated interim summary.   1.  Acute pancreatitis. The patient had recurrent pancreatitis. His pancreatitis is likely secondary to elevated triglycerides, as his triglyceride level was greater than 4000. The reason for his elevated triglyceride level is probably due to his uncontrolled diabetes. Therefore, Dr. Candace Cruise and Dr. Gabriel Carina from Endocrinology were consulted to help [place] with the triglyceridemia and diabetes control, and Dr. Candace Cruise for GI. The patient had an MRCP as well as a CT scan. Dr. Marina Gravel from Surgery was also consulted regarding the MRCP. Again, the pancreatitis is likely secondary to elevated triglycerides and no surgical treatment. It does appear that he may have a small pseudocyst appearing, and will need a repeat CAT scan in about 6 weeks.  2. Uncontrolled diabetes. His A1c was 9.7. He was seen by Dr. Gabriel Carina. He was initially on an insulin drip. He was switched over to his insulin pump, and medication changes were made as per Dr. Gabriel Carina.  3.  Hypertension. The patient was continued on outpatient medications.  4.  Sleep apnea. The patient is on a CPAP machine at night.  5.  Hypertriglyceridemia. The patient was continued on his outpatient medications, including  Lovaza and gemfibrozil,  but was also added simvastatin.  DISCHARGE MEDICATIONS:  1.  Coreg 6.25 b.i.d.  2.  Zoloft 100 mg daily. 3.  Xanax 0.5 mg at bedtime p.r.n.  4.  Lisinopril 20 mg daily.  5.  Humulin R insulin pump.  6.  Aspirin 81 mg daily.  7.  Gabapentin 300 mg daily.  8.  Lovaza 1000 mg 4 tablets daily.  9.  Omeprazole 20 mg daily.  10.  Gemfibrozil 600 mg b.i.d.  11.  Simvastatin 40 mg at bedtime.  12.  Ciprofloxacin 500 mg q. 12 hours for 5 days. 13.  Flagyl 500 mg q. 8 hours for 5 days.  14.  Percocet 5/325 q. 6 hours p.r.n. pain, #20.   INSTRUCTIONS: Discharge diet: Low fat, low cholesterol, ADA diet. Discharge activity as tolerated. Discharge followup:  The patient is to follow up in 3 weeks with Dr. Candace Cruise, in one week with Dr. Derrel Nip, and in one week with Dr. Gabriel Carina.    TIME SPENT: 35 minutes.   The patient was stable for discharge.    ____________________________ Rohen Kimes P. Benjie Karvonen, MD spm:mr D: 03/24/2014 10:31:00 ET T: 03/24/2014 19:06:52 ET JOB#: [id number]  cc: [name], MD Lupita Dawn. Candace Cruise, MD A. Lavone Orn, MD Bowe Sidor P. Benjie Karvonen, MD, <Dictator>     Donell Beers Hernando Reali MD ELECTRONICALLY SIGNED 03/25/2014 12:44

## 2015-02-01 NOTE — H&P (Signed)
PATIENT NAME:  Sergio [name], BRUM MR#:  [id number] DATE OF BIRTH:  Dec 25, 1975  DATE OF ADMISSION:  03/16/2014  This is a 39 year old gentleman with history of hypertriglyceridemia, pancreatitis (recurrent), type 2 diabetes, and obesity. The patient comes today with a history of abdominal pain.   PRIMARY CARE PHYSICIAN: [name], MD  ENDOCRINOLOGIST: [name], MD  HISTORY OF PRESENT ILLNESS: This is a 39 year old gentleman with known history of familial hypertriglyceridemia, recurrent pancreatitis, type 2 diabetes requiring insulin pump, morbid obesity. Comes today to Emergency Department with significant abdominal pain that started last night. The patient had pain 8 out of 10 located in the epigastric area, radiating a little bit to the mesogastric. The patient states that this is different than prior, as the position of the pain is a little bit lower. It is sharp, cramplike occasionally, not alleviated with movement or food, actually worse with pressure on the abdomen, and had some relief after Dilaudid was given. The patient complains of significant nausea but no vomiting. He states that he had a very large steak last night, and he knew that he should not do that. The patient has overall been seen in the Emergency Department, where he was found to have pancreatitis with triglycerides of more than 4000 and a lipase of 211. His CT scan shows acute pancreatitis surrounding the head of the uncinate process of the pancreas and a small amount of mesenteric inflammation. His lipase is normal right now. The patient is admitted for evaluation and control of these problems.   REVIEW OF SYSTEMS: CONSTITUTIONAL: No fever, fatigue, weight loss or weight gain.  EYES: No blurry vision, double vision.  EARS, NOSE, THROAT: No tinnitus or difficulty swallowing.  RESPIRATORY: No cough, wheezing or hemoptysis.  CARDIOVASCULAR: No chest pain or orthopnea. No palpitations.  GASTROINTESTINAL: Positive nausea.  Positive abdominal pain as described above. No hematemesis. No melena. No jaundice. No significant hemorrhoids or hernias.  GENITOURINARY: No dysuria, hematuria, changes in frequency.  ENDOCRINE: No polyuria, polydipsia or polyphagia. The patient has diabetes.  HEMATOLOGIC AND LYMPHATIC: No anemia, easy bruising or bleeding.  SKIN: No rashes or petechiae.  MUSCULOSKELETAL: No significant neck pain or back pain.  NEUROLOGIC: No numbness, tingling or CVA.  PSYCHIATRIC: No insomnia. The patient denies depression.   PAST MEDICAL HISTORY:  1.  Obesity.  2.  Familial hypertriglyceridemia.  3.  Type 2 diabetes, insulin-dependent.  4.  Obstructive sleep apnea.  5.  Acute recurrent pancreatitis due to elevated triglycerides.  6.  Hypertension.   PAST SURGICAL HISTORY: 1.  Tonsillectomy.  2.  Back surgery.  3.  Vasectomy.   ALLERGIES: CODEINE AND IV CONTRAST.   SOCIAL HISTORY: The patient is married, lives with his wife. Does not smoke, does not drink.   CURRENT MEDICATIONS: Include sertraline 100 mg once a day, senna once a day, oxycodone 5 mg every 4 hours (he is not taking anymore), omeprazole 20 mg once daily, metformin 1000 mg 2 times a day, Lovaza 1000 mg 4 capsules once daily, lisinopril 20 mg once a day, Humulin R insulin pump (settings are from midnight to 4:30 a.m. 0.85 units/hour, from 4:30 a.m. to noon 0.9 units/hour, from noon to 12 midnight 0.8 units/hour. The patient uses U-500 insulin).   PHYSICAL EXAMINATION:  VITAL SIGNS: Blood pressure 140/73, pulse 86, respirations 20, temperature 99.3, oxygen saturation 94% on room air.  GENERAL: The patient is alert, oriented x 3. No acute distress. No respiratory distress. Hemodynamically stable.  HEENT: Pupils are equal  and reactive. Extraocular movements are intact. Mucosae are moist. Anicteric sclerae. Pink conjunctivae. No oral lesions. No oropharyngeal exudates.  NECK: Supple. No JVD. No thyromegaly. No adenopathy. No carotid bruits.   CARDIOVASCULAR: Regular rate and rhythm. No murmurs, rubs or gallops are appreciated at this moment. No tenderness to palpation of anterior chest wall.  LUNGS: Clear without any wheezing or crepitus. No use of accessory muscles.  ABDOMEN: Soft, mildly tender to palpation at the epigastric area but no rebound tenderness. No guarding. Bowel sounds are positive. Mild distention. No hepatosplenomegaly.  GENITAL: Deferred.  EXTREMITIES: No edema, cyanosis or clubbing. Pulses +2. Capillary refill less than 3.  NEUROLOGIC: Cranial nerves II through XII intact. Grossly normal exam. Strength is equal in all 4 extremities.  PSYCHIATRIC: No agitation. The patient has very flat affect.  LYMPHATIC: Negative for lymphadenopathy in the neck or supraclavicular areas.  MUSCULOSKELETAL: No joint effusions or joint swelling.  SKIN: No rashes or petechiae. The patient does not seem to have any xanthomas at this moment that are active.   LABORATORY, DIAGNOSTIC, AND RADIOLOGICAL DATA: Glucose 449. BUN and creatinine are pending. Sodium 122, chloride 90. Lipase is 212. Triglycerides 4000. Bilirubin 1.8 and albumin 2.9. White count 10.9, hematocrit 41, platelet count 301.   CT of the abdomen and pelvis: As mentioned above.   EKG: Normal sinus rhythm. No ST depression or elevation.   ASSESSMENT AND PLAN: A 39 year old gentleman with recurrent pancreatitis, familial hypertriglyceridemia, comes with new acute-onset pancreatitis.  1.  Abdominal pain: This is patient who comes with a history of previous pancreatitis. His lipase is negative, but his triglycerides are above 4000. The patient had a CT scan that actually showed pancreatitis on the head of the pancreas and with some inflammation around the intestinal areas secondary to this pancreatitis. The patient is going to be admitted with IV fluids, IV pain medications including Dilaudid, aggressive hydration, and followup lipase. His triglycerides are also going to be  followed up. The patient is on insulin pump, and we need to make sure that he gets his total doses of insulin and sliding scale to correct this hypertriglyceridemia. There are some issues that are of concern. To be worth mentioning is probably noncompliance with diet, as the patient had a large steak yesterday, and in the past on the previous notes it says that he has not been taking his medications. For now we are going to admit, treat it, and monitor closely. The patient is going to be n.p.o. Endocrinology consultation placed.  2.  Diabetes: Continue IV fluids, as his blood sugars are 500. Continue insulin pump.  3.  Hypertriglyceridemia: As mentioned above, it is secondary to familial hypertension. His triglycerides are above 4000. The patient is going to continue his insulin pump for now. If we feel like this is not sufficient will transition him to insulin drip that we can manage by ourselves, but the patient wants to try his insulin pump first and we are going to give him the benefit of the doubt.  4.  Hyponatremia: Corrected, sodium is around 129. This is likely also secondary to intravascular volume depletion. We corrected his sodium with the triglycerides and with the glucose and it is  still low.  5.  Hyperbilirubinemia: Likely secondary to the inflammatory process with the pancreatitis. Continue to monitor. No stones or dilation of the bile duct.  6.  Slight elevation of white count of 10.9: Continue to monitor, as the patient also had a temperature of 99.3. The  patient will not be on antibiotics for now, but if there is any worsening, we will start antibiotics. 7.  Deep venous thrombosis prophylaxis with heparin.  8.  Gastrointestinal prophylaxis with Pepcid.   I spent about 50 minutes with this patient.    ____________________________ [place] Sink, MD rsg:jcm D: 03/16/2014 17:23:18 ET T: 03/16/2014 17:57:28 ET JOB#: [id number]  cc: [place] Sink, MD,  <Dictator> Sladen Plancarte America Brown MD ELECTRONICALLY SIGNED 03/17/2014 19:34

## 2015-02-01 NOTE — Consult Note (Signed)
Chief Complaint:  Subjective/Chief Complaint Overall better. On CPAP. Less abd pain. Trig level coming down.   VITAL SIGNS/ANCILLARY NOTES: **Vital Signs.:   11-Jun-15 03:00  Pulse Pulse 70  Respirations Respirations 16  Systolic BP Systolic BP 462  Diastolic BP (mmHg) Diastolic BP (mmHg) 863  Mean BP 115  Pulse Ox % Pulse Ox % 99  Pulse Ox Activity Level  At rest  Oxygen Delivery Room Air/ 21 %; Non-invasive ventilation (CPAP/BIPAP)  Nurse Fingerstick (mg/dL) FSBS (fasting range 65-99 mg/dL) 114   Brief Assessment:  GEN no acute distress   Cardiac Regular   Respiratory clear BS   Gastrointestinal less abd tenderness   Lab Results: Routine Chem:  10-Jun-15 04:47   Triglycerides, Serum  1389 (Result(s) reported on 20 Mar 2014 at 05:35AM.)  Lipase 122 (Result(s) reported on 20 Mar 2014 at 05:30AM.)  Glucose, Serum  131  BUN  6  Creatinine (comp) 0.77  Sodium, Serum  134  Potassium, Serum  3.2  Chloride, Serum 99  CO2, Serum 29  Calcium (Total), Serum  8.0  Anion Gap  6  Osmolality (calc) 268  eGFR (African American) >60  eGFR (Non-African American) >60 (eGFR values <28m/min/1.73 m2 may be an indication of chronic kidney disease (CKD). Calculated eGFR is useful in patients with stable renal function. The eGFR calculation will not be reliable in acutely ill patients when serum creatinine is changing rapidly. It is not useful in  patients on dialysis. The eGFR calculation may not be applicable to patients at the low and high extremes of body sizes, pregnant women, and vegetarians.)  Routine Hem:  10-Jun-15 04:47   Platelet Count (CBC) 164 (Result(s) reported on 20 Mar 2014 at [facility])   Radiology Results: CT:    06-Jun-15 15:04, CT Abdomen and Pelvis Without Contrast  CT Abdomen and Pelvis Without Contrast   REASON FOR EXAM:    (1) abd pain; (2) pel pain  COMMENTS:  lab unable to attain bun/creat due to high triglycrides     PROCEDURE: CT  - CT ABDOMEN AND  PELVIS W0  - Mar 16 2014  3:04PM     CLINICAL DATA:  Abdominal pain and nausea; history of pancreatitis    EXAM:  CT ABDOMEN AND PELVIS WITHOUT CONTRAST    TECHNIQUE:  Multidetector CT imaging of the abdomen and pelvis was performed  following the standard protocol without IV contrast. Oral contrast  was administered.  COMPARISON:  Feb 15, 2013 CT abdomen and pelvis    FINDINGS:  There is patchy bibasilar lung atelectatic change.    Liver is enlarged measuring 26.7 cm in length. There is diffuse  fatty change in the liver. No focal liver lesions are identified on  this non contrast enhanced study. There is no biliary duct  dilatation. Gallbladder wall is not thickened.    The spleen is enlarged, measuring 17.0 x 12.7 x 8.1 cm. No focal  splenic lesions are identified on this noncontrast enhanced study.  There are small splenules in the left upper quadrant.    There is inflammation surrounding the head and uncinate process of  the pancreas. The previous extensive inflammation surrounding the  remainder the pancreas is not present at this time. There is some  rather minimal stranding in the mesenteric between the stomach and  pancreas, markedly improved from the previous study. There is no  pancreatic duct dilatation. No mass or pseudocyst is seen in the  pancreatic region.    Adrenals appear normal. Kidneys  bilaterally show no mass or  hydronephrosis on either side. There is no renal or ureteral  calculus on either side.    In the pelvis, very bladder is midline with normal wall thickness.  There is no pelvic mass or fluid collection. The appendix appears  normal.  There is no bowel obstruction.  No free air or portal venous air.    There is no ascites, adenopathy, or abscess in the abdomen or  pelvis. There is no abdominal aortic aneurysm. There is degenerative  change in the lumbar spine. There are no blastic or lytic bone  lesions.     IMPRESSION:  There is acute  pancreatitis, primarily surrounding the head and  uncinate process of the pancreas. A small amount of mesenteric  inflammation is seen between the stomach and pancreatic body. There  is much less pancreatitis than on the prior study, however. No  pseudocyst or mass is seen. The greatest degree of inflammation is  located posterior to the uncinate process of the pancreas where  there is moderate mesenteric thickening. There is inflammatory  change extending to the level of the third portion of the duodenum,  although the third portion the duodenum does not appear  significantly thickened on this study.    There is generalized hepatomegaly and splenomegaly. There is diffuse  fatty change in the liver. Question a degree of underlying  cirrhosis.    There is no appreciable ascites.    There is no abscess or bowel obstruction. Appendix appears normal.  No renal or ureteral calculus. No hydronephrosis.      Electronically Signed    By: Lowella Grip M.D.    On: 03/16/2014 15:19         Verified By: Leafy Kindle. WOODRUFF, M.D.,   Assessment/Plan:  Assessment/Plan:  Assessment Acute pancreatitis. Slowly improving.   Plan Continue IV hydration. Moniter K level. For MRCP today.   Electronic Signatures: Verdie Shire (MD)  (Signed 11-Jun-15 07:21)  Authored: Chief Complaint, VITAL SIGNS/ANCILLARY NOTES, Brief Assessment, Lab Results, Radiology Results, Assessment/Plan   Last Updated: 11-Jun-15 07:21 by Verdie Shire (MD)

## 2015-02-01 NOTE — Assessment & Plan Note (Signed)
Well controlled on current regimen. Renal function  reportedly  stable, no changes today.

## 2015-02-01 NOTE — Consult Note (Signed)
PATIENT NAME:  Sergio [name], Sergio [name] MR#:  [id number] DATE OF BIRTH:  06-26-1976  DATE OF CONSULTATION:  09/03/2014  REFERRING PHYSICIAN:  Hillary Bow, MD.  CONSULTING PHYSICIAN:  A. Lavone Orn, MD  CHIEF COMPLAINT: Hypertriglyceridemia-induced acute pancreatitis and diabetes.   HISTORY OF PRESENT ILLNESS: This is a 39 year old male seen in consultation for the above complaints. He has a history of long-standing type 2 diabetes and recurrent acute pancreatitis in the setting of uncontrolled familial hypertriglyceridemia. The patient admitted with 2 days of epigastric abdominal pain similar to prior episodes of acute pancreatitis. He had nausea and vomiting yesterday. Poor appetite today, though was eating yesterday. He has long been noncompliant with his medical management of diabetes. Also not compliant with his recommended diet. He has been started on IV fluids and has been given Dilaudid for epigastric pain. The patient somewhat altered due to the Dilaudid. Wife is at the bedside, she is able to assist with the history. He does have a MiniMed Medtronic insulin pump. He follows with me for pump management, though not very compliant with keeping regular office visits. I reviewed his insulin pump as well as his blood glucose meter. He has not been checking blood sugars regularly at all. Has only checked 4 blood sugars over the last month and those blood sugars have been in the range of 180-350. He typically is bolusing insulin with his insulin pump without checking blood sugars or counting carbohydrates, 1-2 times daily. Currently the patient is maintained on his insulin pump and over the course of the day blood sugars have been in the 179-215 range.   PAST MEDICAL HISTORY:  1.  Familial hypertriglyceridemia.  2.  Recurrent acute pancreatitis.  3.  Type 2 diabetes mellitus.  4.  Morbid obesity.  5.  Obstructive sleep apnea.  6.  Hypertension.  7.  Anxiety and depression.  8.  Fatty liver disease.    PAST SURGICAL HISTORY:  1.  Tonsillectomy.  2.  Back surgery.  3.  Vasectomy.   SOCIAL HISTORY: The patient is married. He is employed. No tobacco use or alcohol use.   FAMILY HISTORY: The patient was adopted, details of family history are not known.   CURRENT INPATIENT MEDICATIONS:  1. Humulin RU-500 insulin via insulin pump.  2. Coreg 6.25 mg q. 12 hours.  3. Gemfibrozil 600 mg b.i.d.  4. Lisinopril 20 mg daily.  5. Gabapentin 300 mg at bedtime.  6. Omega-3 fatty acid 4 grams at bedtime.    7. Zoloft 100 mg daily.  8. Aspirin 81 mg daily.  9. Heparin subcutaneous 5000 units q. 8 hours.  10. Metformin 1000 mg daily.  11. Normal saline 0.9% at 150 mL/hour.   ALLERGIES: CODEINE.   REVIEW OF SYSTEMS: Unable to provide.   PHYSICAL EXAMINATION:  VITAL SIGNS: Height 71.9 inches, weight 252 pounds, BMI 34.2. Temperature 98.2, pulse 82, respirations 19, blood pressure 117/67.  GENERAL: Obese white male in no acute distress.  HEENT: EOMI. Oropharynx is clear. Mucous membranes moist.  NECK: Supple. No thyromegaly.  CARDIAC:  Regular rate and rhythm without murmur.  PULMONARY: Clear to auscultation bilaterally. No wheeze.  ABDOMEN: Epigastric tenderness to palpation, mild and without guarding. No distention.  SKIN: The patient has erupted xanthoma which are flesh-colored papules on the olecranon and forearms.  NEUROLOGIC: The patient is drowsy, unable to perform thorough neurologic exam. PSYCHIATRIC: He is calm. He is arousable and cooperative.  EXTREMITIES: No peripheral edema is present.   LABORATORY DATA: Glucose 454,  sodium 125, potassium 3.3, CO2 23. Lipase 307. Triglycerides greater than 4000, total cholesterol 362. Platelets 277,000, hematocrit 37.6. Urine, no ketones or bilirubin, greater than 500 glucose, negative nitrite and leukocyte esterase.   ASSESSMENT:  1.  Acute pancreatitis due to hypertriglyceridemia.  2.  Familial hypertriglyceridemia.  3.  Uncontrolled type  2 diabetes.  4.  Morbid obesity.  5.  Medication and dietary noncompliance.   RECOMMENDATIONS:  1.  Blood sugar is reasonably controlled on his insulin pump. Okay to continue pump at this time. However, should his pain worsen or clinical situation worsen, we could consider more rapid improvement in managing triglyceride levels with IV insulin and IV dextrose to allow to give high doses of the IV insulin.  2.  Recommended to patient that he not eat until his abdominal pain has resolved.  3.  Continue metformin.  4.  Continue gemfibrozil and fish oil.  5.  When diet is advanced, he should follow a low-carbohydrate, low-fat diet.   I will follow along with you. I will arrange for outpatient refresher with a nutritionist as well as with the insulin pump clinical diabetes educator.    ____________________________ A. Lavone Orn, MD ams:bu D: 09/03/2014 17:28:41 ET T: 09/03/2014 18:50:16 ET JOB#: [id number]  cc: A. Lavone Orn, MD, <Dictator> [name] MD ELECTRONICALLY SIGNED 09/11/2014 17:36

## 2015-02-01 NOTE — H&P (Signed)
PATIENT NAME:  Sergio [name], ARQUETTE MR#:  [id number] DATE OF BIRTH:  May 13, 1976  DATE OF ADMISSION:  09/03/2014  REFERRING PHYSICIAN: Charlesetta Ivory, MD   PRIMARY CARE PHYSICIAN: [name], MD   ADMISSION DIAGNOSIS: Intractable pain secondary to chronic pancreatitis.   HISTORY OF PRESENT ILLNESS: This is a 39 year old Caucasian male who presents to the Emergency Department with mid epigastric pain. The patient has a history of hypertriglyceridemia which is frequently associated with pancreatitis. Evaluation in the Emergency Department revealed a lipase that was not very impressive at 307, but clinical symptoms of pancreatitis. His pain was not relieved after 3 doses of Dilaudid and so the Emergency Department staff called for admission.   REVIEW OF SYSTEMS: CONSTITUTIONAL: The patient denies fever or fatigue.  EYES: Denies inflammation or blurred vision. EARS, NOSE AND THROAT: Denies tinnitus or sore throat.  RESPIRATORY: Denies cough or shortness of breath.  CARDIOVASCULAR: Denies chest pain or palpitations. GASTROINTESTINAL: Admits to some nausea, but denies vomiting or diarrhea. The patient admits to abdominal pain that is localized to the mid epigastrium and radiates some to the back.  GENITOURINARY: The patient denies dysuria, increased frequency, or hesitancy of urination.  ENDOCRINE: Denies polyuria, polydipsia.  INTEGUMENT: Denies rashes or lesions. HEMATOLOGIC AND LYMPHATIC: The patient denies easy bruising or bleeding.  MUSCULOSKELETAL: Denies arthralgias or myalgias.  NEUROLOGIC: Denies numbness in his extremities or dysarthria.  PSYCHIATRIC: Denies depression or suicidal ideation.   PAST MEDICAL HISTORY: Diabetes type 2, hypertriglyceridemia, hypertension, diabetic neuropathy, obstructive sleep apnea, and depression.   PAST SURGICAL HISTORY: Laminectomy, tonsillectomy and adenoidectomy as well as vasectomy.   FAMILY HISTORY: The patient is adopted and does not know his birth  family history.   SOCIAL HISTORY: The patient is a nonsmoker. He does not drink or do any drugs. He is married with children all of whom are in good health.   MEDICATIONS: 1.  Alprazolam 0.5 mg 1 tablet p.o. at bedtime as needed for sleep.  2.  Aspirin 81 mg 1 tab p.o. daily.  3.  Carvedilol 6.25 mg 1 tab p.o. b.i.d.  4.  Gabapentin 300 mg 1 capsule p.o. at bedtime.  5.  Gemfibrozil 600 mg 1 tablet p.o. b.i.d.  6.  Humulin R subcutaneous injections via insulin pump.  7.  Lovaza 1000 mg 4 capsules p.o. daily.  8.  Metformin 1000 mg 1 tablet p.o. at bedtime.  9.  Omeprazole 20 mg 1 capsule p.o. daily.  10.  Sertraline 100 mg 1 tablet p.o. daily.   ALLERGIES: CODEINE AND INTRAVENOUS DYE.   PERTINENT LABORATORY RESULTS AND RADIOGRAPHIC FINDINGS: Serum glucose 454. BUN is not calculable as is serum creatinine. Serum sodium 125, serum potassium 3.3, chloride 90, bicarb 23. Calcium is not calculable. Lipase 307. Serum albumin 2.5, total bili 1.6. Liver enzymes are not calculable. Troponin is negative. White blood cell count is 8.7. Hemoglobin is not calculable but hematocrit is 37.6, platelet count 277,000 and MCV 78. Urinalysis is negative for infection.   The patient does not have any imaging for this admission.   PHYSICAL EXAMINATION: VITAL SIGNS: Temperature is 98.3, pulse 86, respirations 20, blood pressure 149/94, and pulse ox is 94% on room air.  GENERAL: The patient is alert and oriented x3, in no apparent distress.  HEENT: Normocephalic, atraumatic. Pupils equal, round, and reactive to light and accommodation. Extraocular movements are intact. Mucous membranes are moist.  NECK: Trachea is midline. No adenopathy.  CHEST: Symmetric, atraumatic.  CARDIOVASCULAR: Regular rate and rhythm. Normal S1,  S2. No rubs, clicks, or murmurs appreciated.  LUNGS: Clear to auscultation bilaterally. Normal effort and excursion.  ABDOMEN: Positive bowel sounds. Soft, diffusely tender in right flank as  well as right upper quadrant radiating to the mid epigastrium. There is no voluntary guarding or rebound tenderness. He does not have any hepatosplenomegaly.  GENITOURINARY: Deferred.  MUSCULOSKELETAL: The patient moves all 4 extremities equally. There is 5/5 strength in upper and lower extremities bilaterally.  SKIN: No rashes or lesions.  EXTREMITIES: No clubbing, cyanosis, or edema.  NEUROLOGIC: Cranial nerves II through XII are grossly intact.  PSYCHIATRIC: Mood is normal. Affect is congruent.   ASSESSMENT AND PLAN: This is a 39 year old male with chronic pancreatitis secondary to hypertriglyceridemia who has intractable pain.  1.  Intractable pain secondary to chronic pancreatitis. Our goal is to manage the patient's pain so that he may start eating by mouth again.  2.  Pseudohyponatremia, secondary to high cholesterol.  3.  Diabetes type 2 with hyperglycemia. I have placed the patient on intravenous fluid and will allow him to deliver insulin via his pump based on dosing regimen to take into account his insulin sensitivity factor or correction formula for periods of time when he is hyperglycemic. Dr. Gabriel Carina has been involved in the past, and I have used her recommendations with this admission. I will leave official endocrine consultation to the discretion of the primary care team. Also continue metformin.  4.  Hypertension. Continue lisinopril and Coreg.  5.  Hypertriglyceridemia. Continue gemfibrozil. It is unclear why the patient is also not on statin therapy as he states that he has not had any cramping or side effects to this medication in the past. Also continue omega-3 fatty acids.  6.  Depression. Continue sertraline.  7.  Deep vein thrombosis prophylaxis. Lovenox.  8.  Gastrointestinal prophylaxis. None as the patient is not critically ill.   CODE STATUS: The patient is a FULL code.   TIME SPENT ON ADMISSION ORDERS AND PATIENT CARE: Approximately 35  minutes. ____________________________ Norva Riffle. Marcille Blanco, MD msd:sb D: 09/03/2014 08:33:49 ET T: 09/03/2014 08:51:28 ET JOB#: [id number]  cc: Norva Riffle. Marcille Blanco, MD, <Dictator> Norva Riffle Courtez Twaddle MD ELECTRONICALLY SIGNED 09/11/2014 23:53

## 2015-02-01 NOTE — Consult Note (Signed)
PATIENT NAME:  Sergio [name], Sergio [name] MR#:  [id number] DATE OF BIRTH:  11/03/1975  DATE OF CONSULTATION:  03/18/2014  REQUESTING PHYSICIAN:  Palestine Sink, MD  CONSULTING PHYSICIAN:  A. Lavone Orn, MD  CHIEF COMPLAINT: Hypertriglyceridemia and pancreatitis.   HISTORY OF PRESENT ILLNESS: This is a 39 year old male seen in consultation for acute pancreatitis induced by severe hypertriglyceridemia. The patient has a history of familial hypertriglyceridemia with recurrent pancreatitis, type 2 diabetes, and morbid obesity. He developed severe epigastric abdominal pain 3 days ago with nausea, vomiting, presented to the Emergency Department and was found to have an elevated lipase of 211, triglycerides greater than 4000, and inflammation surrounding the head and uncinate process of the pancreas consisting with acute pancreatitis. He was placed on IV insulin and IV fluids and his outpatient gemfibrozil and fish oil were continued. Blood sugars have been in good range. He is now on IV insulin in addition to 10% dextrose with blood sugars in the 75-130 range over the last 10 hours. Triglycerides have improved to 2319 and lipase level has normalized.   PAST MEDICAL HISTORY:  1. Familial hypertriglyceridemia.  2. Type 2 diabetes mellitus.  3. Obesity.  4. Fatty liver disease.  5. Recurrent pancreatitis.  6. Hypertension.  7. Obstructive sleep apnea.   PAST SURGICAL HISTORY:  1. Tonsillectomy.  2. Back surgery.  3. Vasectomy.   ALLERGIES: CODEINE AND IV CONTRAST.   SOCIAL HISTORY: The patient is married. He lives with his family. No tobacco use. No alcohol use. He is employed.   CURRENT MEDICATIONS:  1. IV regular insulin.  2. Neurontin 300 mg daily.  3. Lopid 600 mg b.i.d.  4. Zestril 20 mg daily.  5. Coreg 6.25 mg b.i.d.  6. Pepcid 20 mg b.i.d.  7. Flagyl 100 mg q. 8 hours.  8. Omega-3 fatty acids 2 grams daily.  9. Zoloft 100 mg daily.  10. Heparin 5000 units subcutaneously q. 8  hours.  11. D10 at 75 mL/hour.  12. 0.9% normal saline at 65 mL/hour.   REVIEW OF SYSTEMS:  GENERAL: No weight loss. No fever.  HEENT: No blurred vision. No sore throat.  NECK: No neck pain. No dysphagia.  CARDIAC: No chest pain.  No palpitation.  PULMONARY: No cough. No shortness of breath.  ABDOMEN: He has epigastric abdominal pain. Nausea has resolved.  EXTREMITIES: Denies lower extremity edema.  SKIN: Reports papules on the elbows and ankles, long-standing and chronic, not particularly worsened from prior. No acute rash or skin changes.  ENDOCRINE:  Denies heat or cold intolerance.  GENITOURINARY: Reports urine appears dark. Denies dysuria.   PHYSICAL EXAMINATION:  VITAL SIGNS: Height 71.9 inches, weight 269 pounds, BMI 36. Temperature 98.5, pulse 71, respirations 16, blood pressure 91/61, pulse oximetry 99%.  GENERAL: Obese white male in no acute distress.  HEENT: EOMI. Oropharynx is clear. Mucous membranes moist.  NECK: Supple. No thyromegaly.  CARDIAC: Regular rate and rhythm. No murmur.  PULMONARY: Clear to auscultation bilaterally. No wheeze.  ABDOMEN: Epigastric tenderness, decreased bowel sounds.  EXTREMITIES: No peripheral edema is present.  SKIN:  Tendinous xanthomas are present on the olecranon and achilles. No rash is present. No ecchymotic lesions are present.  NEUROLOGICAL:  No dysarthria. No tremor.   LABORATORY DATA: Glucose 118, BUN 8, creatinine 0.9, sodium 127, potassium 3.6, chloride 95, calcium 7.3, triglycerides 2319, lipase 162.   ASSESSMENT: A 39 year old male with familial hypertriglyceridemia, type 2 diabetes, obesity, NASH, admitted for recurrent acute pancreatitis.   RECOMMENDATIONS:  1.  Agree with use of IV insulin targeting a normal range of blood sugar in the 80-180 range. Agree with concurrent 10% dextrose to avoid hypoglycemia.  2. Once his abdominal pain has resolved and triglycerides are less than 1000, we can transition him back to his insulin  pump. Spoke with the patient and his wife about this.  They state he was managing well on his pump prior to this episode. They have all the pump supplies they need. They will bring them in and leave them at the bedside once we are ready. Anticipate this will take another 48 or more hours.  3. Continue Lopid.  4. Increase fish oil  to 4 grams daily.  5. Increase IV fluids to 100 mL/hour.  6. We will obtain a hemoglobin A1c, as this has not been done in recent months.   Thank you for the kind request for consultation. I will follow along with you.    ____________________________ A. Lavone Orn, MD ams:dd D: 03/18/2014 17:51:39 ET T: 03/18/2014 20:36:47 ET JOB#: [id number]  cc: A. Lavone Orn, MD, <Dictator> [name] MD ELECTRONICALLY SIGNED 03/20/2014 17:14

## 2015-02-01 NOTE — Consult Note (Signed)
Pt seen and examined. Full consult to follow. Pt with recurrent pancreatitis, presumably from high triglyceride levels. Not well under control. No alcohol. No GB disease. Has had multiple CT's and GB u/s over the years. Still with abd pain though less than few days ago. On IVF. Started clears. Have to worry about patient developing chronic pancreatitis later. Will order MRCP to evaluate CBD and PD and make sure there is no disruption of PD leading to recurrent bouts. Continue IV hydration. Control cholesterol better. Will follow. THanks.   Electronic Signatures: Verdie Shire (MD) (Signed on 10-Jun-15 12:21)  Authored   Last Updated: 11-Jun-15 07:22 by Verdie Shire (MD)

## 2015-02-01 NOTE — Assessment & Plan Note (Signed)
He was advised to continue fenofibrate but has chosen to discontinue medication an dis scheduled for fasting labs tomorrow.  Low GI diet reviewed an dneeed for regular exercise

## 2015-02-01 NOTE — Discharge Summary (Signed)
PATIENT NAME:  Sergio [name], Sergio [name] MR#:  [id number] DATE OF BIRTH:  11/24/1975  DATE OF ADMISSION:  02/09/2014 DATE OF DISCHARGE:  02/11/2014  ADMITTING DIAGNOSIS: Acute pancreatitis.   DISCHARGE DIAGNOSES:  1. Recurrent hypertriglyceridemia related to pancreatitis.  2. Hyperlipidemia.  3. Hypertriglyceridemia.  4.  Diabetes mellitus, insulin-dependent, uncontrolled with hemoglobin A1c 9.6 on insulin pump.  5. Dehydration.  6. Hyponatremia related to dehydration as well as pseudohyponatremia due to hyperlipidemia.  7. Hypertension.  8. Obesity.  9. Obstructive sleep apnea on CPAP at night.  DISCHARGE CONDITION: Stable.   MEDICATIONS: The patient is continue metformin 1 gram twice daily, carvedilol 6.25 mg twice daily, sertraline 100 mg p.o. daily, alprazolam 0.5 mg at bedtime as needed, lisinopril 20 mg p.o. daily, Humulin R for insulin pump,  aspirin 81 mg p.o. daily, gabapentin 300 mg p.o. once daily,  gemfibrozil 600 mg twice daily, Lovaza 1 gram 4 capsules once daily, omeprazole 10 mg p.o. daily, oxycodone 5 mg every four hours as needed, Colace 100 mg twice daily, Senna 8.6 mg once at bedtime.   HOME OXYGEN: None.   DIET: 2 grams salt, low-fat, low-cholesterol, carbohydrate-controlled diet, full liquid diet initially. Advance to regular consistency as tolerated. Diet consistency would be thin. The patient was recommended to back-up his diet to liquidy diet if abdominal pain recurs.   ACTIVITY LIMITATIONS: As tolerated.    FOLLOWUP APPOINTMENT: With Dr. Derrel Nip in two days after discharge.   CONSULTANTS: Care management, social work.   RADIOLOGIC STUDIES: None.   The patient is a 39 year old Caucasian male with history of hypertriglyceridemia who presents to the hospital with recurrent pancreatitis with abdominal pain as well as nausea. Please refer to Dr. Graciela Husbands admission on 02/09/2014. On arrival to the Emergency Room, the patient's vital signs were temperature of 98.4, pulse was  84, respiration rate was 20, blood pressure 130/81. Saturation was 93% on room air. Physical exam revealed epigastric tenderness on palpation, otherwise unremarkable. The patient's lab data done in the Emergency Room, revealed high glucose level of 567, sodium 119, potassium 3.4, otherwise BMP was unremarkable. However,  the patient's BUN as well as creatinine levels were difficult to see and estimated GFR was not done. The patient's osmolality as well as calcium level was also impossible to see due to severe hypertriglyceridemia. HDL was found to be 36. However,  the patient's blood specimen was lipemic and no further testing could be performed. The patient' total bilirubin level was 2.3. Albumin level was 2.8. Troponin level was less than 0.02. White blood cell count was normal at 8.9, hemoglobin was 13.5, platelet count 229. Urinalysis was unremarkable. EKG showed poor data quality. Interpretation may be adversely affected. Normal sinus rhythm at 88 beats per minute, normal axis, no acute ST-T changes were noted. The patient was admitted to the hospital for further evaluation. He was initiated on  IV fluids and given insulin for severe hyperglycemia. His hemoglobin A1c was checked and was found to be 9.6. The patient was treated initially with n.p.o. and then later on a clear liquid diet was initiated when his symptoms subsided. Of note, the patient's lipase level done on admission was normal at 298.  However, since symptoms were recurrent and very similar to his prior symptoms, it was thought that the patient has recurrent pancreatitis. With conservative therapy, his pancreatitis improved. Because he did have mild elevation of total bilirubin the patient underwent ultrasonic evaluation of his right upper quadrant on 02/10/2014, which revealed only hepatic steatosis; otherwise,  no abnormalities were found. The patient's diet was advanced to low fat, low cholesterol diet on 02/11/2014. There is no significant  resulting discomfort. The patient was felt to be stable to be discharged home today on 02/11/2014. He is to continue therapy with cholesterol-lowering medications. He was also recommended to watch his diet very closely. In regards to hyperlipidemia, but also due to diabetes mellitus, which was very poorly controlled. He was advised to follow up with an endocrinologist in the next few days after discharge; Dr. Gabriel Carina or other primary endocrinologist to address insulin pump issues, possibly advance insulin pump dosage.    In regards to his recurrent pancreatitis, he is also to follow up with Dr. Vira Agar and possibly discuss with him cholecystectomy. It appeared that the patient was not using his usual antihyperlipidemic medications such as gemfibrozil and Lovaza. I discussed this verification with him and he assured me that he does have prescriptions for those medications. I discussed that it is absolutely imperative for him to continue a strict diet, as well as  his hypertriglyceridemia controlled with medication. However, I told him that poor diabetes mellitus control can increase his hypertriglyceridemia level significantly.  In regards to dehydration, the patient  was rehydrated with IV fluids and his hyponatremia improved.  For history of hypertension, obesity, obstructive sleep apnea, the patient is to continue his outpatient management. He is being discharged in stable condition with the above-mentioned medications and follow-up. On the day of discharge, temperature was 98, pulse was 82, respiratory rate was 18, blood pressure 116/74, saturation was 96% on CPAP at rest.   TIME SPENT: 40 minutes. ____________________________ Theodoro Grist, MD rv:sg D: 02/11/2014 19:21:00 ET T: 02/12/2014 07:07:20 ET JOB#: [id number]  cc: [name], MD Theodoro Grist, MD, <Dictator>     [place] MD ELECTRONICALLY SIGNED 02/28/2014 15:16

## 2015-02-01 NOTE — Assessment & Plan Note (Signed)
Previously uncontrolled, requiring use of insulin pump, last A1c was 9.8 in December ,  DM managed by Dr Lavone Orn.  Since his bariatric inducedd weight loss his BS have been controlled with diet alon and his most recent A1c is still pending. Advised him to follow up with [name]..  Foot exam was normal today.

## 2015-02-01 NOTE — Discharge Summary (Signed)
PATIENT NAME:  Sergio [name], Sergio [name] MR#:  [id number] DATE OF BIRTH:  1976-09-24  DATE OF ADMISSION:  05/27/2014 DATE OF DISCHARGE:  06/02/2014  PRIMARY CARE PHYSICIAN: [name], MD  ENDOCRINOLOGIST: [name], MD  FINAL DIAGNOSES:  1.  Acute pancreatitis secondary to hypertriglyceridemia.  2.  Diabetes.  3.  Hypertriglyceridemia.  4.  Sleep apnea.  5.  Hypertension.  6.  Hyponatremia.   MEDICATIONS ON DISCHARGE: Include Coreg 6.25 mg every 12 hours, Zoloft 100 mg daily, Xanax 0.5 mg at bedtime as needed for sleep, lisinopril 20 mg daily, aspirin 81 mg daily, Lovaza 1000 mg 4 capsules once a day, omeprazole 20 mg daily, gemfibrozil 600 mg twice a day, gabapentin 300 mg at bedtime, Humulin R subcutaneous insulin pump as directed by Dr. Gabriel Carina, Percocet 5/325 one tablet every 4 hours as needed for moderate pain.   DIET: Regular consistency, low-sodium, low-carbohydrate diet.   ACTIVITY: As tolerated.   FOLLOWUP: With Dr. Gabriel Carina next week; 1-2 weeks with Dr. Derrel Nip.   HOSPITAL COURSE: The patient was admitted 05/27/2014, discharged 06/02/2014, came in with pancreatitis, hypertriglyceridemia and abdominal pain. Admitted with acute pancreatitis, started on IV fluids and pain medications IV.   LABORATORY AND RADIOLOGICAL DATA: Lipase initially was 372 on the 16th. Glucose 673, sodium 125, potassium 3.5, chloride 89, CO2 of 23, total bilirubin 1.7. Other parts of the chemistry, the blood was so lipemic that it could not be resulted. White blood cell count 9.2, hematocrit 36.4, platelet count of 358,000. Urinalysis greater than 500 mg/dL of glucose. Triglycerides greater than 4000. Triglycerides upon discharge 1990. Lipase normalized at 96.   HOSPITAL COURSE PER PROBLEM LIST:  1.  For the patient's acute pancreatitis, secondary to hypertriglyceridemia, the patient was given IV fluid hydration, pain medications IV. The patient had slight pain upon discharge, but managed with oral pain  medications. He was tolerating diet with no further pain and was discharged home in stable condition.  2.  For his diabetes, he is on the insulin pump, managed by Dr. Gabriel Carina.  3.  For his hypertriglyceridemia greater than 4000 on presentation, his blood was very lipemic. It took a few days to actually get chemistries and blood counts to show up with results upon discharge as triglycerides were 1990. I did stress diet, exercise and weight loss and continuing his Lovaza and gemfibrozil.  4.  Sleep apnea, wear CPAP at night.  5.  Hypertension. Blood pressure stable.  6.  Hyponatremia. This had improved with better control of his sugars.  TIME SPENT ON DISCHARGE: Thirty-five minutes.    ____________________________ Tana Conch. Leslye Peer, MD rjw:TT D: 06/02/2014 14:02:29 ET T: 06/02/2014 22:00:34 ET JOB#: [id number]  cc: Tana Conch. Leslye Peer, MD, <Dictator> [name], MD A. Lavone Orn, MD  Marisue Brooklyn MD ELECTRONICALLY SIGNED 06/14/2014 15:06

## 2015-02-01 NOTE — Discharge Summary (Signed)
PATIENT NAME:  Sergio [name], MENDEZ MR#:  [id number] DATE OF BIRTH:  [birth year]  DATE OF ADMISSION:  09/03/2014 DATE OF DISCHARGE:  09/08/2014  DISCHARGE DIAGNOSES:  1.  Acute pancreatitis.  2.  Severe hypertriglyceridemia.  3.  Diabetes mellitus.  4.  Hypertension.  5.  Obesity.  6.  Noncompliance.   DISCHARGE MEDICATIONS:  1.  Coreg 6.25 oral 2 times a day.  2.  Sertraline 100 mg oral once a day. 3.  Alprazolam 0.5 once a day at bedtime as needed.  4.  Lisinopril 20 mg daily.  5.  Aspirin 81 mg daily.  6.  Lovaza 4000 mg once a day.  7.  Omeprazole 20 mg daily.  8.  Gemfibrozil 600 mg oral 2 times a day.  9.  Gabapentin 300 mg oral once a day.  10.  Insulin pump continue as before.  11.  Metformin 1000 mg oral once a day.  12.  Acetaminophen/ oxycodone 325/10 one tablet 4 times a day as needed.   DISCHARGE INSTRUCTIONS: Low-fat, low-cholesterol, carb -controlled diet. Activity as tolerated. Follow up with Dr. Gabriel Carina in 1 to 2 weeks. Need to be compliant with checking blood sugars and diabetes. The patient has been counseled extensively regarding lifestyle changes, weight loss and exercise and diet program.   IMAGING STUDIES: None.   ADMITTING HISTORY AND PHYSICAL: Please see detailed H and P dictated by Dr. Marcille Blanco. In brief, a 39 year old morbidly obese Caucasian male patient with history of hypertriglyceridemia, prior history of recurrent pancreatitis, presented to the Emergency Room complaining of acute onset of abdominal pain, which is progressively worsening along with nausea and vomiting similar to his prior episodes of pancreatitis. The patient was found to have severe hypertriglyceridemia, admitted for acute pancreatitis although his lipase levels were normal.   HOSPITAL COURSE:  Acute pancreatitis secondary to severe hypertriglyceridemia. Initially, the patient was treated symptomatically with IV fluids and nausea medication and pain medication. But with worsening triglyceride  levels, his insulin pump was stopped, started on an insulin drip along with D5. Because his blood sugars were fairly stable, he had q. 1 hour Accu-Cheks. Triglycerides have improved significantly, around 1800. Pain is well controlled. He has tolerated food and is being discharged back home. I have  had a lengthy discussion with him on multiple occasions regarding lifestyle changes, losing weight, regular exercise and being compliant with his Accu-Cheks and diabetes control.   His hypertension was fairly controlled in the hospital stay.   Prior to discharge, the patient has mild tenderness in the epigastric area. No discrete guarding. Bowel sounds present. Lungs are clear.   TIME SPENT ON DAY OF DISCHARGE IN DISCHARGE ACTIVITY: Forty minutes.   ____________________________ Sergio Alf Lynix Bonine, MD srs:TT D: 09/09/2014 14:45:57 ET T: 09/09/2014 16:26:21 ET JOB#: [id number]  cc: Alveta Heimlich R. Fusae Florio, MD, <Dictator> A. Lavone Orn, MD Alveta Heimlich Arlice Colt MD ELECTRONICALLY SIGNED 09/26/2014 13:05

## 2015-02-01 NOTE — Consult Note (Signed)
Chief Complaint:  Subjective/Chief Complaint Pt feeling better. MRI results discussed with patient. No problems with PD or CBD but early pseudocyst possible.   VITAL SIGNS/ANCILLARY NOTES: **Vital Signs.:   12-Jun-15 12:00  Vital Signs Type Q 4hr  Temperature Temperature (F) 98.7  Celsius 37  Temperature Source oral  Pulse Pulse 73  Respirations Respirations 18  Systolic BP Systolic BP 147  Diastolic BP (mmHg) Diastolic BP (mmHg) 72  Mean BP 90  Pulse Ox % Pulse Ox % 95  Pulse Ox Activity Level  At rest  Oxygen Delivery Room Air/ 21 %   Brief Assessment:  GEN no acute distress   Cardiac Regular   Respiratory clear BS   Gastrointestinal minimal tenderness   Lab Results: Routine Chem:  11-Jun-15 04:52   Glucose, Serum  121  BUN  5  Creatinine (comp) 0.84  Sodium, Serum 136  Potassium, Serum  3.3  Chloride, Serum 101  CO2, Serum 28  Calcium (Total), Serum 8.5  Anion Gap 7  Osmolality (calc) 270  eGFR (African American) >60  eGFR (Non-African American) >60 (eGFR values <29m/min/1.73 m2 may be an indication of chronic kidney disease (CKD). Calculated eGFR is useful in patients with stable renal function. The eGFR calculation will not be reliable in acutely ill patients when serum creatinine is changing rapidly. It is not useful in  patients on dialysis. The eGFR calculation may not be applicable to patients at the low and high extremes of body sizes, pregnant women, and vegetarians.)  Triglycerides, Serum  950 (Result(s) reported on 21 Mar 2014 at 0[facility])  Lipase 128 (Result(s) reported on 21 Mar 2014 at 0[facility])  12-Jun-15 07:37   Glucose, Serum  158  BUN 7  Creatinine (comp) 0.82  Sodium, Serum 136  Potassium, Serum 3.9  Chloride, Serum 100  CO2, Serum 29  Calcium (Total), Serum 8.6  Anion Gap 7  Osmolality (calc) 273  eGFR (African American) >60  eGFR (Non-African American) >60 (eGFR values <667mmin/1.73 m2 may be an indication of chronic kidney  disease (CKD). Calculated eGFR is useful in patients with stable renal function. The eGFR calculation will not be reliable in acutely ill patients when serum creatinine is changing rapidly. It is not useful in  patients on dialysis. The eGFR calculation may not be applicable to patients at the low and high extremes of body sizes, pregnant women, and vegetarians.)  Triglycerides, Serum  777 (Result(s) reported on 22 Mar 2014 at 08:27AM.)   Radiology Results: MRI:    11-Jun-15 12:06, MRCP MR Cholangiogram  MRCP MR Cholangiogram   REASON FOR EXAM:    recurrent pancreatitis  COMMENTS:       PROCEDURE: MR  - MR MRCP  - Mar 21 2014 12:06PM     CLINICAL DATA:  Abdominal pain for 1 week.  Recurrent pancreatitis.    EXAM:  MRI ABDOMEN WITHOUT AND WITH CONTRAST (INCLUDING MRCP)    TECHNIQUE:  Multiplanar multisequence MR imaging of the abdomen was performed  both before and after the administration of intravenous contrast.  Heavily T2-weighted images of the biliary and pancreatic ducts were  obtained, and three-dimensional MRCP images were rendered by post  processing.    CONTRAST:  20 cc MultiHance    COMPARISON:  03/16/2014 CT.  02/10/2014 ultrasound.    FINDINGS:  Portions of exam are mild to moderately motion degraded.    Development of patchy bibasilar airspace disease. Trace bilateral  pleural fluid which may be physiologic.    Hepatomegaly and moderate  to marked hepatic steatosis. The liver  measures 25.2 cm craniocaudal.  Splenomegaly, with the spleen measuring 17.1 cm craniocaudal.    Normal stomach, gallbladder.    Moderate peripancreatic edema, most apparent adjacent the pancreatic  head and uncinate process. Extension of edema along the right-sided  anterior pararenal space.    A tiny focus of T2 hyperintensity and postcontrast hypo enhancement  is identified within the inferior aspect of the uncinate process of  the pancreas. This measures 1.2 cm on image  25/series 15 and image  28/series 24.    All regional vascular structures enhance normally.  No biliary ductal dilatation or evidence choledocholithiasis. The  common duct measures 6 mm on image 23/series 8.    Normal adrenal glands.  Tiny bilateral renal cysts.    No abdominal adenopathy or ascites.     IMPRESSION:  1. Mild to moderately motion degraded exam.  2. Similar mild to moderate pancreatitis, most apparent within the  head and uncinate process.  3. An area of uncinate process T2 hyperintensity and hypoenhancement  is favored to represent a developing pseudocyst. An area of  pancreatic necrosis could look similar.  4. No evidence of cholelithiasis, biliary obstruction, or  choledocholithiasis.  5. Hepatomegaly and hepatic steatosis.      Electronically Signed    By: Abigail Miyamoto M.D.    On: 03/21/2014 12:32         Verified By: Areta Haber, M.D.,   Assessment/Plan:  Assessment/Plan:  Assessment Pancreatitis resolving slowly. Early signs of chronic pancreatitis on MRCP?   Plan Advance diet gradually as tolerated. Wil need repeat CT in few months to see if pseudocyst present. Will follow. Thanks.   Electronic Signatures: Verdie Shire (MD)  (Signed 12-Jun-15 14:24)  Authored: Chief Complaint, VITAL SIGNS/ANCILLARY NOTES, Brief Assessment, Lab Results, Radiology Results, Assessment/Plan   Last Updated: 12-Jun-15 14:24 by Verdie Shire (MD)

## 2015-02-01 NOTE — Consult Note (Signed)
PATIENT NAME:  Sergio [name], Sergio [name] MR#:  [id number] DATE OF BIRTH:  Feb 28, 1976  DATE OF CONSULTATION:  05/27/2014  REFERRING PHYSICIAN:  [name], MD CONSULTING PHYSICIAN:  A. Lavone Orn, MD  CHIEF COMPLAINT: Uncontrolled diabetes and abdominal pain.   HISTORY OF PRESENT ILLNESS: This is a 39 year old male seen in consultation at the request of Dr. Margaretmary Eddy, who presented today with abdominal pain. The patient has a history of familial hypertriglyceridemia with recurrent acute pancreatitis as well as type 2 diabetes managed with an insulin pump. He presented with progressive worsening abdominal pain over the last several days. He has not had any nausea, vomiting. He has had poor p.o. intake for the last day. Denies fevers or chills. States pain was slightly different than typical abdominal pain. Pain is located in the epigastric region.  He has a history, as mentioned, of recurrent acute pancreatitis, has been hospitalized for this several times per year, with last hospitalization about 4 months ago. Also with a history of type 2 diabetes. The patient was started on insulin pump last year. He follows with me in the endocrinology clinic, although has been fairly noncompliant. In reviewing his insulin pump use today, I see that he has not been taking insulin for his carbohydrate intake recently. I suspect he infrequently checks blood sugars. He does have his insulin pump in place. Last hemoglobin A1c on 02/21/2014 was 10.1%. Diabetes is chronically uncontrolled. On presenting to the ED, an initial serum blood sugar was greater than 600. He has been given subcutaneous insulin and IV fluids, and currently blood sugars are in the 200 range. His insulin pump has been removed.   PAST MEDICAL HISTORY:  1.  Familial hypertriglyceridemia.  2.  Acute recurrent pancreatitis.  3.  Type 2 diabetes mellitus.  4.  Obesity.  5.  Fatty liver disease.  6.  Hypertension.  7.  Obstructive sleep apnea.   PAST SURGICAL  HISTORY: 1.  Tonsillectomy.  2.  Back surgery.  3.  Vasectomy.   ALLERGIES: CODEINE AND IV CONTRAST.   SOCIAL HISTORY: The patient is married. He lives with his family. No tobacco or alcohol use.   CURRENT MEDICATIONS:  1.   Normal saline 0.9% at 180 mL/hr.  2.  Coreg 6.25 mg b.i.d.  3.  Gemfibrozil 600 mg b.i.d.  4.  Dilaudid 1 mg as needed.  5.  Lisinopril 20 mg daily.  6.  Pantoprazole 40 mg b.i.d.  7.  Zoloft 100 mg daily.  8.  NovoLog insulin sliding scale.  REVIEW OF SYSTEMS:  GENERAL: No weight loss. No fever.  HEENT: No blurred vision. No sore throat.  NECK: No neck pain or dysphagia.  CARDIAC: No chest pain or palpitation.  PULMONARY: No cough or shortness of breath.  ABDOMEN: He reports epigastric abdominal pain, reports nausea, reports poor appetite.  EXTREMITIES: Denies lower extremity swelling.  SKIN: Denies recent skin changes or rash.  ENDOCRINE: Denies heat or cold intolerance.  HEMATOLOGIC: Denies easy bruisability or recent bleeding.  GENITOURINARY: Denies dysuria.   PHYSICAL EXAMINATION:  VITAL SIGNS: Height 71.9 inches, weight 252 pounds. Temperature 98.2, pulse 71, respirations 19, BP 117/66, pulse oximetry 94%.  GENERAL: Well-developed white male in no acute distress.  HEENT: EOMI. Oropharynx is clear. Mucous membranes moist.  NECK: Supple. No thyromegaly.  CARDIAC: Regular rate and rhythm without murmur.  PULMONARY: Clear to auscultation bilaterally. No wheeze.  ABDOMEN: He has epigastric tenderness to palpation. He has positive bowel sounds.  EXTREMITIES: No peripheral edema is  present.  SKIN: No rash or dermatopathy is present. Tendinous xanthomas are present on the flexor surfaces, especially upper extremities.  NEUROLOGIC: No focal deficits. Alert and oriented.  PSYCHIATRIC: Calm and cooperative.   LABORATORY DATA: Glucose 443, sodium 127, potassium 3.4, chloride 93, CO2 of 24. Triglycerides greater than 4000. Total cholesterol 281. Albumin 2.2.  Hematocrit 36.4%, MCV 78. Urinalysis notable for greater than 500 glucose, otherwise negative.   ASSESSMENT: A 39 year old male with familial hypertriglyceridemia, history of acute pancreatitis, admitted with abdominal pain and poor p.o. intake; however, with normal lipase level. May have mild pancreatitis for alternative cause of his abdominal pain. Secondary issue is uncontrolled diabetes. Third issue is severe hypertriglyceridemia, which likely is at baseline, with levels in the 5000-10,000 range.   RECOMMENDATIONS:  1.  Continue with IV fluids as you are.  2.  Continue with pain control as you are.  3.  Continue with keeping patient n.p.o. as you are.  4.  Recommend restarting patient's insulin pump. Insulin therapy will actually additionally help lower his triglyceride levels. No clear role for ever stopping his insulin pump. Pump is designed to give a basal amount of insulin to control blood sugars in fasting state. Therefore, even if patient has a prolonged fast, he really should not develop hypoglycemia if pump is programmed correctly. I had him restart the insulin pump while I watched him at the bedside.  5.  Will stop the insulin sliding scale.  6.  Will have nursing continue to check finger stick blood sugars before meals and at bedtime.  7.  Patient to manage high blood sugars, as needed, with calculator on insulin pump. 8.  Counseled patient as well as his wife on the importance of proper use of the insulin pump. He has a good awareness. He also has good insight as to his noncompliance. In the past, he has had psychologic counseling for this. At this point, there does not seem to be an acute psychiatric need. 9.  As patient's pancreatitis is, at best, mild, I would expect a very short hospital stay as per primary team.  10.  Will additionally add fish oil to assist with lowering triglyceride levels. He should be taking 4 g daily.  Thank you for the kind request for consultation. I will  follow along with you.   ____________________________ A. Lavone Orn, MD ams:ST D: 05/27/2014 21:17:49 ET T: 05/27/2014 21:47:00 ET JOB#: [id number]  cc: A. Lavone Orn, MD, <Dictator> [name] MD ELECTRONICALLY SIGNED 06/10/2014 21:54

## 2015-02-01 NOTE — Consult Note (Signed)
PATIENT NAME:  Sergio [name], Sergio [name] MR#:  [id number] DATE OF BIRTH:  07-Jan-1976  DATE OF CONSULTATION:  03/20/2014  REFERRING PHYSICIAN:   CONSULTING PHYSICIAN:  Lupita Dawn. Candace Cruise, MD  REASON FOR CONSULTATION: Recurrent pancreatitis.   HISTORY OF PRESENT ILLNESS: The patient is a 39 year old white male with known history of hypertriglyceridemia, recurrent pancreatitis and type 2 diabetes who got admitted on June 6th with acute abdominal pain. This pain started the night before admission. The patient's pain was in the epigastric area. This started after he ate some steak. He also has significant nausea but no vomiting.   When he was brought in, ER ordered some blood tests. His lipase was 211, but his triglyceride level was greater than 4000. CT scan of the abdomen showed acute pancreatitis involving the head of the pancreas. As a result, the patient was brought in for admission, evaluation and management.   According to the wife, the patient has had recurrent pancreatitis, at least for 5 to 6 years. The patient has required multiple admissions to the hospital for the same reasons. Several CT scans have been done over the years a few which confirmed acute pancreatitis. Ultrasounds were also done several different times. Gallbladder always looked normal. There was no evidence of stones or sludge in either the ultrasound or CT scan.   The patient denies alcohol use. There is no family history of pancreatitis. Since being admitted the pain is still there, but it is less than when he presented to the Emergency Room.   REVIEW OF SYSTEMS: There are no fevers or chills or weight changes. There are no visual or hearing changes. No coughing or shortness of breath. GI symptoms are notable for nausea and abdominal pain. There is no change in the bowel habits. There is no gross hematochezia or melena. The rest of the review of systems is negative.   PAST MEDICAL HISTORY: Notable for familial hypertriglyceridemia. He has type  2 diabetes. He also has obstructive sleep apnea for which he uses CPAP at night. Other history includes hypertension, obesity, and recurrent pancreatitis.   PAST SURGICAL HISTORY: Includes vasectomy, back surgery, and tonsillectomy.  ALLERGIES: CODEINE, IV CONTRAST.  SOCIAL HISTORY: He does not smoke or drink.    MEDICATIONS AT HOME: Include sertraline 100 mg daily, oxycodone as needed, omeprazole 20 mg daily, metformin 1000 mg 2 tablets a day, Lovaza 1000 mg 4 capsules daily, lisinopril 20 mg daily, insulin pump.   PHYSICAL EXAMINATION: GENERAL: The patient is an obese male in no acute distress.  VITAL SIGNS: He is afebrile. His vital signs are stable.  HEENT: Normocephalic, atraumatic head. Pupils are equally reactive. Throat was clear.  NECK: Supple.  CARDIAC: Regular rate and rhythm.  LUNGS: Clear bilaterally.  ABDOMEN: Shows moderate tenderness in the epigastrium. There is no rebound or guarding. He has active bowel sounds. There is slight distention of the abdomen. There is no hepatomegaly.  EXTREMITIES: No clubbing, cyanosis, or edema.  NEUROLOGIC: Nonfocal.  SKIN: Normal.   DIAGNOSTIC DATA: In terms of the labs, from June 10th, sodium was 134, potassium 3.2, chloride 99, CO2 29, BUN 6, glucose 133, and triglyceride level is 1389. It was greater than 4000 when he presented. Lipase was 311 on admission, but it did go up to 1363 by June the 7th. It went back to normal at 162 by June 8th.    In terms of the CT scan, there is evidence of acute pancreatitis primarily involving the head of the uncinate  process  of the pancreas. There was some mesenteric inflammation. No pseudocyst or mass was seen.   ASSESSMENT AND PLAN: This is a patient with acute but recurrent pancreatitis that is probably related to hypertriglyceridemia which has not been adequately controlled despite multiple hospitalizations for the same thing. The patient was started on some clear liquid diet, which he was able to  tolerate. I am concerned that the patient may develop chronic pancreatitis later because of the recurrent pancreatitis. Will order a MRCP to evaluate the common bile duct and the pancreatic duct to make sure there is no obstruction of the pancreatic duct leading to recurrent bouts. We will also check for any sludge or stone present in the bile duct. I agree with aggressive IV hydration. We need to control the cholesterol level better. Hopefully, over the next few days, the pancreatitis will gradually improve.   Thank you for the referral.  ____________________________ Lupita Dawn. Candace Cruise, MD pyo:sb D: 03/21/2014 07:29:47 ET T: 03/21/2014 07:44:04 ET JOB#: [id number]  cc: Lupita Dawn. Candace Cruise, MD, <Dictator> Lupita Dawn Sharifa Bucholz MD ELECTRONICALLY SIGNED 03/22/2014 11:46

## 2015-02-01 NOTE — H&P (Signed)
PATIENT NAME:  Sergio [name], ONG MR#:  [id number] DATE OF BIRTH:  06-21-1976  DATE OF ADMISSION:  02/09/2014  REFERRING PHYSICIAN: Dr. Robet Leu  PRIMARY CARE PHYSICIAN: Dr. Derrel Nip   ENDOCRINOLOGIST: Dr. Gabriel Carina   CHIEF COMPLAINT: Abdominal pain, nausea.   HISTORY OF PRESENT ILLNESS: This 39 year old male, with known history of recurrent pancreatitis secondary to familial hypertriglyceridemia, history of type 2 diabetes, on insulin pump, morbid obesity, who comes to the Emergency Room today complaining of abdominal pain and nausea. Denies any vomiting. The patient reports symptoms have been going on for a few days.  This time, lipase was within normal level at 324. The patient was found to have elevated glucose level of 557 and sodium level at 119. The patient required multiple IV pain medications in the Emergency Department, so hospitalist requested to admit the patient.   PAST MEDICAL HISTORY: 1.  Obesity.  2.  Type 2 diabetes on insulin pump.  3.  Familial hypertriglyceridemia.  4.  Obstructive sleep apnea.  5.  Recurrent acute pancreatitis due to elevated triglycerides.  6.  Hypertension.    PAST SURGICAL HISTORY: 1.  Tonsillectomy.  2.  Back surgery.  3.  Vasectomy.    ALLERGIES: CODEINE AND IV CONTRAST DYE.   SOCIAL HISTORY: Married, lives with his wife. No tobacco. No alcohol use.   HOME MEDICATIONS: Aspirin 81 mg daily, lisinopril 20 mg daily, gabapentin 300 mg daily, sertraline 100 mg daily, insulin pump, metformin 1 gram oral 2 times a day, gemfibrozil 600 mg 2 times a day, alprazolam 0.5 mg at bedtime as needed, Coreg 6.25 mg every 12 hours, Lovaza 1000 mg 4 capsules daily, omeprazole 20 mg oral daily.   REVIEW OF SYSTEMS:  CONSTITUTIONAL:  Denies fever, chills, fatigue, weakness.  EYES: Denies blurry vision, double vision, inflammation, glaucoma.  ENT: Denies tinnitus, ear pain, hearing loss, epistaxis.  RESPIRATORY: Denies cough, wheezing, hemoptysis.  CARDIOVASCULAR:  Denies chest pain, orthopnea, edema.  GASTROINTESTINAL: Reports nausea, abdominal pain. Denies vomiting, diarrhea, hematemesis, melena.  GENITOURINARY: Denies dysuria, hematuria, renal colic.  ENDOCRINE: Denies polyuria or polydipsia, heat or cold intolerance.  HEMATOLOGIC: Denies anemia, easy bruising, bleeding diathesis.  INTEGUMENT: Denies acne, rash or skin lesion.  MUSCULOSKELETAL: Denies any joint effusion erythema, any arthritis, gout or cramps.  NEUROLOGIC: Denies CVA, transient ischemic attack, headache, tremors.  PSYCHIATRIC: Reports history of depression. Denies anxiety or schizophrenia.   PHYSICAL EXAMINATION: VITAL SIGNS: Temperature 98.4, pulse 84, respiratory rate 20, blood pressure 130/81, O2 saturation 93% on room air.  GENERAL: Obese male, looks comfortable in bed, in no apparent distress.  HEENT: Head atraumatic, normocephalic. Pupils equal, reactive to light. Pink conjunctivae. Anicteric sclerae. Moist oral mucosa.  NECK: Supple. No thyromegaly. No JVD.  CHEST: Good air entry bilaterally. No wheezing, rales, rhonchi.  CARDIOVASCULAR: S1, S2 heard. No rubs, murmur or gallops.  ABDOMEN: He has epigastric tenderness to palpation. Bowel sounds present. No rebound, no guarding.  EXTREMITIES: No edema. No clubbing. No cyanosis. Pedal and radial pulses felt bilaterally.  PSYCHIATRIC: Appropriate affect. Awake, alert x3. Intact judgment and insight.  NEUROLOGIC: Cranial nerves grossly intact. Motor 5/5. No focal deficits.  SKIN: Warm and dry.   PERTINENT LABORATORY DATA: Chloride 84, CO2 25, sodium 119, potassium 3.3, glucose 557, lipase 324. Troponin less than 0.02. White blood cell 8.9, hemoglobin 13.5, hematocrit 40.3, platelet 329.   ASSESSMENT AND PLAN: 1.  Abdominal pain. Even though the patient has negative lipase, but it might be due to recurrent pancreatitis. We will repeat  lipase level in the morning. As well, patient will be admitted as he is requiring multiple IV  pain medications. So, he will be admitted, kept on clear liquid diet, on aggressive IV hydration. We will follow up lipase in the morning. As well, we will start him on insulin drip to help lower his triglycerides. As well, we will keep him on as-needed pain and nausea medicine.  2.  Diabetes mellitus, uncontrolled. Will start the patient on insulin drip.  3.  Hypertension. Blood pressure acceptable. Continue with home medications.   4.  Depression. Continue with sertraline.  5.  Hypertriglyceridemia. Continue with Lovaza and gemfibrozil.  6.  Deep venous thrombosis prophylaxis. Subcutaneous heparin.   CODE STATUS: Full code.   TOTAL TIME SPENT ON ADMISSION AND PATIENT CARE: 50 minutes.      ____________________________ Albertine Patricia, MD dse:cg D: 02/09/2014 03:43:11 ET T: 02/09/2014 04:25:15 ET JOB#: [id number]  cc: Albertine Patricia, MD, <Dictator> Isidro Monks Graciela Husbands MD ELECTRONICALLY SIGNED 02/11/2014 0:07

## 2015-02-01 NOTE — Assessment & Plan Note (Signed)
Given chronicity of symptoms, development of facial pain and exam consistent with bacterial URI,  Will treat with empiric antibiotics, decongestants, and saline lavage.  Adding steroid nasal spray if not already taking.  °

## 2015-02-01 NOTE — H&P (Signed)
PATIENT NAME:  Sergio [name], Sergio [name] MR#:  [id number] DATE OF BIRTH:  [birth year]  DATE OF ADMISSION:  05/27/2014  REFERRING PHYSICIAN: Dr. Charlesetta Ivory.   PRIMARY CARE DOCTOR: Dr. [name]  ADMITTING DIAGNOSIS: Pancreatitis, hypertriglyceridemia and abdominal pain.   HISTORY OF PRESENT ILLNESS: This is a 39 year old Caucasian male who presents to the Emergency Department with mid epigastric pain as well as nausea. The patient has a history of chronic pancreatitis and has been diagnosed with hypertriglyceridemia. He states that in his abdominal pain began two days ago and has progressively worsened. He has had normal bowel movements and not been able to vomit, which he which he hoped would relieve his pain. The patient states his diabetes has been well controlled, as he has an insulin pump. He was surprised to find his sugar to be significantly elevated in the Emergency Department. He denies any antecedent illnesses. His last episode of pancreatitis was four months ago.   REVIEW OF SYSTEMS:  CONSTITUTIONAL: The patient denies fever or weakness.  ENT: The patient denies nosebleeds or sore throat.  EYES: The patient denies decrease in visual acuity or inflammation.  RESPIRATORY: The patient denies cough or shortness of breath.  CARDIOVASCULAR: The patient denies chest pain or palpitations.  GASTROINTESTINAL: The patient admits to nausea and abdominal pain but denies vomiting.  GENITOURINARY: The patient denies dysuria, increased frequency or hesitancy.  ENDOCRINE: The patient denies polyuria or nocturia.  HEMATOLOGIC AND LYMPHATIC: The patient denies easy bruising or bleeding.  MUSCULOSKELETAL: The patient denies arthralgias or myalgias.  INTEGUMENT: The patient denies rashes or lesions.  NEUROLOGIC: The patient denies numbness or weakness.  PSYCHIATRIC: The patient denies depression or suicidal ideation.   PAST MEDICAL HISTORY: Significant for diabetes type 2, hypertriglyceridemia,  hypertension, depression, diabetic neuropathy, obstructive sleep apnea.   PAST SURGICAL HISTORY: Tonsillectomy and adenoidectomy, laminectomy and vasectomy.   FAMILY HISTORY: The patient is adopted and is unaware of his family history.   SOCIAL HISTORY: The patient is married with children, all of whom are in good health. He does not smoke, drink or do any drugs.   MEDICATIONS:  1.  Citrulline 100 mg 1 tab p.o. daily.  2.  Omeprazole 20 mg 1 capsule p.o. daily. 3.  Lovaza 1000 mg 4 capsules p.o. daily. 4.  Lisinopril 20 mg 1 tab p.o. daily.  5.  Humulin R delivered via insulin pump.  6.  Gemfibrozil 600 mg 1 tab p.o. b.i.d.  7.  Gabapentin 300 mg 1 capsule p.o. daily.  8.  Carvedilol 6.25 mg 1 tab p.o. b.i.d.  9.  Aspirin 81 mg 1 tablet p.o. daily.  10.  Alprazolam 0.5 mg 1 tab p.o. at bedtime as needed for sleep.    ALLERGIES: CODEINE AND INTRAVENOUS DYE.   PERTINENT LABORATORY RESULTS AND RADIOLOGIC FINDINGS: Initial blood sugar 637, currently 223. Sodium initially 125, now 127, potassium 3.4, cholesterol 281, triglycerides more than 4000, HDL 15. Lipase is was initially 372, now is 256. White blood cell count 9.2, hematocrit is 36.4, MCV 78. Urinalysis is negative. Lactic acid is 1.5.    PHYSICAL EXAMINATION: VITAL SIGNS: Temperature 98.8, pulse 86, respirations 18, blood pressure 135/88, pulse oximetry is 99% on room air.  GENERAL: The patient is alert and oriented x3. He is in mild distress from abdominal pain.   HEENT: Normocephalic, atraumatic. Pupils equal, round, and reactive to light. Extraocular movements are intact. Mucous membranes are moist.  NECK: Trachea is midline. No adenopathy.  CHEST: Symmetric and atraumatic.  CARDIOVASCULAR: Regular rate and rhythm. Normal S1, S2. No rubs, clicks or murmurs.  LUNGS: Clear to auscultation bilaterally. Normal effort and excursion.  ABDOMEN: Positive bowel sounds. Soft, nondistended. No hepatosplenomegaly. The patient is exquisitely  tender in the mid epigastrium. He does not have any guarding or rebound tenderness.  GENITOURINARY: Deferred.  MUSCULOSKELETAL: The patient moves all four extremities equally.  SKIN: No rashes or lesions. The port for the insulin pump is in place.  EXTREMITIES: No clubbing, cyanosis, or edema.  NEUROLOGIC: Cranial nerves II through XII are grossly intact.  PSYCHIATRIC: Mood is normal. Affect is congruent.   ASSESSMENT AND PLAN: This is a 39 year old male with chronic pancreatitis secondary to hypertriglyceridemia.   1.  Pancreatitis. No sepsis. Repeat BMP showed some mild improvement in his sodium, which was low due to initial hyperglycemia. He had no anion gap. Lactic acidosis is likely secondary to some dehydration. We will aggressively hydrate the patient. He will remain on nothing by mouth except for medications for now.  2.  Diabetes, type 2. We will place the patient on sliding scale insulin for now. He will not use his pump.  3.  Hypertriglyceridemia. The patient's lipid profile is reportedly better today that it has been in the past, but obviously contributes to his pancreatitis.  4.  Deep vein thrombosis prophylaxis. Sequential compression devices.  5.  Gastrointestinal prophylaxis. IV Nexium.   TIME SPENT ON ADMISSION ORDERS AND PATIENT CARE: 40 minutes.    ____________________________ Norva Riffle. Marcille Blanco, MD msd:cg D: 05/27/2014 08:04:02 ET T: 05/27/2014 08:35:18 ET JOB#: [id number]  cc: Norva Riffle. Marcille Blanco, MD, <Dictator> Norva Riffle Omaya Nieland MD ELECTRONICALLY SIGNED 05/28/2014 5:13

## 2015-02-03 ENCOUNTER — Telehealth: Payer: Self-pay | Admitting: Internal Medicine

## 2015-02-03 LAB — SURGICAL PATHOLOGY

## 2015-02-03 NOTE — Telephone Encounter (Signed)
My portion of [place] DMV form has been completed. Please charge $29 and make copy for chart.  In red folder

## 2015-02-04 ENCOUNTER — Telehealth: Payer: Self-pay | Admitting: Internal Medicine

## 2015-02-04 NOTE — Telephone Encounter (Signed)
Patient has been notified to pick up form for DMV. A copy sent to billing and to scan

## 2015-02-04 NOTE — Telephone Encounter (Signed)
Called patient and left voicemail to let him know that he could come by and pick up DMV paperwork from the front desk. 02/04/15  AN

## 2015-02-09 DIAGNOSIS — Z7689 Persons encountering health services in other specified circumstances: Secondary | ICD-10-CM

## 2015-02-09 NOTE — Discharge Summary (Signed)
PATIENT NAME:  Sergio [name], Sergio [name] MR#:  [id number] DATE OF BIRTH:  [birth year]  DATE OF ADMISSION:  10/28/2014 DATE OF DISCHARGE:  11/05/2014  DISCHARGE DIAGNOSES:  1. Acute pancreatitis due to hypertriglyceridemia.  2. Hypertension.  3. Diabetes.  4. Reflux esophagitis.   DISCHARGE MEDICATIONS:  1. Carvedilol 6.25 mg oral tablet every 12 hours.  2. Alprazolam 0.5 mg oral tablet once a day.  3. Lisinopril 20 mg once a day.  4. Aspirin 81 mg once a day.  5. Lovaza 1000 mg oral capsule once a day.  6. Gemfibrozil 600 mg oral tablet 2 times a day.  7. Humulin R subcutaneous pump of insulin as directed by Dr. Gabriel Carina in the office.  8. Gabapentin 300 mg oral capsule once a day.  9. Sertraline 100 mg oral tablet once a day.  10. Acetaminophen and oxycodone 325/5 mg oral 4 times a day as needed for pain.  11. Sucralfate 1 gram oral 4 times a day before meals and at bedtime.  12. Pantoprazole 40 mg delayed-release 2 times a day   DIET ON DISCHARGE: Low-sodium, low-fat, low-cholesterol, carbohydrate-controlled diet.   DIET CONSISTENCY: Regular.   ACTIVITY: As tolerated.   TIMEFRAME TO FOLLOWUP: Within 1 to 2 weeks in GI clinic. Advised to follow with Dr. Donnella Sham and Dr. Helene Kelp Tullo's office.   HISTORY OF PRESENTING ILLNESS: A 39 year old Caucasian male with history of type 2 diabetes, insulin-requiring, complicated by peripheral neuropathy, hypertriglyceridemia, presenting to emergency room with abdominal pain. Pain was in epigastric location, going to the back, sharp, 9 to 10 in intensity. Was with any oral intake. No relieving factor. Associated with nausea and vomiting. Admitted with pancreatitis. His triglyceride level was noted to be more than 4000.   HOSPITAL COURSE AND STAY: Because of hypertriglyceridemia pancreatitis, he was admitted to the critical care unit and started and continued on insulin IV drip to help with hypertriglyceridemia. His triglyceride level gradually came down, up to  1200, and then we stopped his insulin drip. Continued oral medications.   For pancreatitis, we managed symptomatically for pain and nausea medication and with IV fluid. Lipase level came down within just 1 to 2 days, but his pain was still there, which was significant, and not coming down and he was not able to tolerate oral intake, so we suspected him having some other etiology for that, suspicious for ulcer or gastritis was there, and so started him on sucralfate and b.i.d. PPI therapy and GI consult was called in. GI saw the patient and they did endoscopy after the weekend, which showed mild reflux esophagitis, but not much finding, so suggested to upgrade his diet and discharge him home and follow in the office, advised to continue sucralfate and b.i.d. PPI therapy, which we continued on discharge.   Pseudohyponatremia, likely because of hypoglycemia. This was present on admission, collected in hospital.   Hypertension. We continued carvedilol and lisinopril.   Type 2 diabetes. Initially, he was on insulin drip, but later on once we started on diet, we resumed his insulin pump, what he was taking before admission.   Depression and anxiety. Continued Zoloft and Xanax.   [place]: GI consult with Dr. Donnella Sham.   IMPORTANT LABORATORY RESULTS: WBC count 7.9, RBC is 5.11, platelet count is 401. Activated PTT 36.6, potassium 3.3, CO2 is 27, chloride is 85. Lipase is 2049, and triglycerides were more than 4000 on admission. On October 30, 2014, sodium 129 and potassium 3.9. Triglyceride level came down to  1800 on November 02, 2014, and 1348 on November 03, 2014.   TOTAL TIME SPENT ON THIS DISCHARGE: 50 minutes.   ____________________________ Ceasar Lund Anselm Jungling, MD vgv:ap D: 11/08/2014 14:00:33 ET T: 11/08/2014 15:17:09 ET JOB#: [id number]  cc: Ceasar Lund. Anselm Jungling, MD, <Dictator> Lollie Sails, MD [name], MD Vaughan Basta MD ELECTRONICALLY SIGNED  11/25/2014 9:56

## 2015-02-09 NOTE — Consult Note (Signed)
Chief Complaint:  Subjective/Chief Complaint seen for pancreatitis, epigastric pain.  about the same as yesterday.  describes pain as 4-5/10.  no n/v passing flatus. breathing better today, no sob.   VITAL SIGNS/ANCILLARY NOTES: **Vital Signs.:   25-Jan-16 05:07  Temperature Temperature (F) 98.3  Celsius 36.8  Temperature Source oral  Pulse Pulse 56  Respirations Respirations 20  Systolic BP Systolic BP 130  Diastolic BP (mmHg) Diastolic BP (mmHg) 67  Mean BP 80  Pulse Ox % Pulse Ox % 97  Pulse Ox Activity Level  At rest  Oxygen Delivery Room Air/ 21 %  Pulse Ox Heart Rate 71   Brief Assessment:  Cardiac Regular   Respiratory clear BS   Gastrointestinal details normal Soft  Nondistended  No rebound tenderness  mild tenderness epigastrum, llq, rlq.  no masses rebound or organomegaly   Lab Results: Routine Chem:  25-Jan-16 04:27   Triglycerides, Serum  1201 (Result(s) reported on 04 Nov 2014 at 04:51AM.)  Lipase  45 (Result(s) reported on 04 Nov 2014 at 05:04AM.)  Glucose, Serum  136  BUN 7  Creatinine (comp) 0.90  Sodium, Serum 137  Potassium, Serum 3.6  Chloride, Serum 100  CO2, Serum 30  Calcium (Total), Serum 8.5  Anion Gap 7  Osmolality (calc) 274  eGFR (African American) >60  eGFR (Non-African American) >60 (eGFR values <84m/min/1.73 m2 may be an indication of chronic kidney disease (CKD). Calculated eGFR, using the MRDR Study equation, is useful in  patients with stable renal function. The eGFR calculation will not be reliable in acutely ill patients when serum creatinine is changing rapidly. It is not useful in patients on dialysis. The eGFR calculation may not be applicable to patients at the low and high extremes of body sizes, pregnant women, and vegetarians.)  Routine Coag:  25-Jan-16 04:27   Prothrombin 12.8  INR 1.0 (INR reference interval applies to patients on anticoagulant therapy. A single INR therapeutic range for coumarins is not optimal for  all indications; however, the suggested range for most indications is 2.0 - 3.0. Exceptions to the INR Reference Range may include: Prosthetic heart valves, acute myocardial infarction, prevention of myocardial infarction, and combinations of aspirin and anticoagulant. The need for a higher or lower target INR must be assessed individually. Reference: The Pharmacology and Management of the Vitamin K  antagonists: the seventh ACCP Conference on Antithrombotic and Thrombolytic Therapy. CQMVHQ.4696Sept:126 (3suppl): [id number] A HCT value >55% may artifactually increase the PT.  In one study,  the increase was an average of 25%. Reference:  "Effect on Routine and Special Coagulation Testing Values of Citrate Anticoagulant Adjustment in Patients with High HCT Values." American Journal of Clinical Pathology 2006;126:400-405.)  Routine Hem:  25-Jan-16 04:27   WBC (CBC) 4.1  RBC (CBC) 4.74  Hemoglobin (CBC)  12.4  Hematocrit (CBC)  37.9  Platelet Count (CBC) 163  MCV 80  MCH 26.2  MCHC 32.7  RDW  16.9  Neutrophil % 43.3  Lymphocyte % 44.7  Monocyte % 7.0  Eosinophil % 4.2  Basophil % 0.8  Neutrophil # 1.8  Lymphocyte # 1.8  Monocyte # 0.3  Eosinophil # 0.2  Basophil # 0.0 (Result(s) reported on 04 Nov 2014 at 05:04AM.)   Assessment/Plan:  Assessment/Plan:  Assessment 1) pancreatitis, secondary to hypertriglycerides.  lipase normal, continued epigastric pain, not yet at baseline.  concern for possible other etiology/gastric.  H/O [place]  2) OSA/on cpap 3) patient considering bariatric surgery.  4) medical issues including IDDM, htn,  depression.   Plan 1) egd today.  I have discussed the risks benefits dn complications of egd to include not limited to bleeding infection perforation and sedation and he wishes to proceed.   Electronic Signatures: Loistine Simas (MD)  (Signed 25-Jan-16 12:01)  Authored: Chief Complaint, VITAL SIGNS/ANCILLARY NOTES, Brief Assessment, Lab Results,  Assessment/Plan   Last Updated: 25-Jan-16 12:01 by Loistine Simas (MD)

## 2015-02-09 NOTE — Consult Note (Signed)
PATIENT NAME:  Sergio [name], Sergio [name] MR#:  [id number] DATE OF BIRTH:  1976/06/01  DATE OF CONSULTATION:  11/05/2014  REQUESTING PROVIDER:   Gladstone Lighter, MD CONSULTING PHYSICIAN:  A. Lavone Orn, MD  CHIEF COMPLAINT: Hypertriglyceridemia.   HISTORY OF PRESENT ILLNESS: This is a 39 year old male with familial hypertriglyceridemia and type 2 diabetes, admitted on 10/28/2014, with acute pancreatitis. He has a history of recurrent acute pancreatitis in the setting of severely uncontrolled hypertriglyceridemia. The patient follows with me at [facility]. Diabetes is managed with an insulin pump. Chart was reviewed. It is noted that the patient's presents with an initial triglycerides of over 4000, lipase 2049, and blood glucose 418. His insulin pump was stopped. He was placed on insulin drip. As blood sugars normalized, IV dextrose was added. The patient was continued on IV insulin until January 24 and on January 25, the patient was restarted on his insulin pump. Blood sugars today have been in the range of 130-218. He has had gradual improvement over the course of the hospitalization in his abdominal pain as well as  normalization of his lipase level and improvement in the triglycerides. Triglyceride level yesterday was 1201. He is now consuming a full diet.   PAST MEDICAL HISTORY: 1.  Type 2 diabetes mellitus.  2.  Familial hypertriglyceridemia.  3.  Obesity.  4.  Acute recurrent pancreatitis in setting of hypertriglyceridemia.  5.  Hypertension.  6.  Obstructive sleep apnea on CPAP.  7.  Depression.   SOCIAL HISTORY: The patient is married. He has several children. He is employed. No tobacco use. No alcohol use.   FAMILY HISTORY: The patient is adopted.   CURRENT MEDICATIONS: 1.  NovoLog insulin via insulin pump.  2.  Coreg 6.25 mg q. 12 hours.  3.  Neurontin 300 mg at bedtime.  4.  Lopid 600 mg b.i.d.  5.  Zestril 20 mg daily.  6.  Omega-3 fatty acid 4 grams daily.  7.   Protonix 40 mg b.i.d.  8.  Zoloft 100 mg daily.  9.  Carafate 1 gram q. a.c., at bedtime.  10.  Aspirin 81 mg daily.  11.  Heparin 5000 units subcutaneous q. 8 hours.   REVIEW OF SYSTEMS:  GENERAL: He has had weight loss. Reports fatigue.  HEENT: Denies blurred vision. Denies sore throat.  NECK: Denies neck pain or dysphagia.  CARDIAC: Denies chest pain or palpitation.  PULMONARY: Denies cough or shortness of breath.  ABDOMEN: Abdominal pain chiefly resolved. No nausea.  EXTREMITIES: Denies lower extremity swelling.  SKIN: No recent skin changes. No rash.  ENDOCRINE:  Denies heat and cold intolerance.  GENITOURINARY: Denies dysuria and hematuria.  NEUROLOGIC: No recent falls. No tremor.   PHYSICAL EXAMINATION: VITAL SIGNS: Height 70 inches, weight 258 pounds, BMI 37, temperature 98.2, pulse 57, respirations 17, BP 102/65.  GENERAL: Obese white male in no acute distress.  HEENT: EOMI, oropharynx is clear.  NECK: Supple. No thyromegaly.  CARDIAC: Regular rate and rhythm without murmur.  PULMONARY: Clear to auscultation bilaterally. No wheeze.  ABDOMEN: Diffusely soft, nontender, nondistended.  EXTREMITIES: No peripheral edema is present.  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 No rash.  PSYCHIATRIC: Calm, cooperative, alert, and oriented.   LABORATORY DATA: Glucose 136, BUN 7, creatinine 0.9, sodium 137, potassium 3.6, chloride 100, bicarbonate 30, triglycerides 1201, lipase 45, hematocrit 37.9. WBC 4.1, platelets 163,000.  INR 1.0.   ASSESSMENT: 1.  Familial hypertriglyceridemia.  2.  Acute pancreatitis due  to hypertriglyceridemia, resolved.  3.  Diabetes mellitus, type 2, uncontrolled.  4.  Obesity.  5.  Use of insulin pump.   RECOMMENDATIONS: 1.  Patient much improved now. Agree with resumption of his outpatient insulin pump.  2.  Continue fibrate and fish oil.  3. Advised to follow a low-carbohydrate, low-fat diet. The patient has been  counseled extensively in the past. He can arrange for additional counseling by nutritionist on followup.  4.  Patient at risk for chronic pancreatitis. He has also been advised about this previously as well. 5.  I strongly recommend he follow a low-carbohydrate, low-fat diet and improve glycemic control to avoid recurrent pancreatitis, which will provoke chronic pancreatitis eventually. 6.  Needs more consistent blood sugar monitoring, q. a.c., at bedtime. Needs better compliance with use of insulin pump. Needs to enter all carbohydrates into pump. Has been educated about use of pump in the past and we can certainly arrange for additional followup as an outpatient for counseling regarding pump use if needed.  7.  Anticipate a followup with patient next 2-4 weeks as outpatient.   Thank you for the kind request for consultation    ____________________________ A. Lavone Orn, MD ams:LT D: 11/05/2014 17:41:27 ET T: 11/05/2014 18:59:47 ET JOB#: [id number]  cc: A. Lavone Orn, MD, <Dictator> [name] MD ELECTRONICALLY SIGNED 11/13/2014 13:03

## 2015-02-09 NOTE — Consult Note (Signed)
Chief Complaint:  Subjective/Chief Complaint seen for pancreatitis.  continues with epigastric abdominal pain, 4/10-improving.  no emesis, minimal nauea, tolerating clears.  passing flatus.   VITAL SIGNS/ANCILLARY NOTES: **Vital Signs.:   24-Jan-16 11:36  Temperature Temperature (F) 97.8  Celsius 36.5  Pulse Pulse 61  Respirations Respirations 18  Systolic BP Systolic BP 048  Diastolic BP (mmHg) Diastolic BP (mmHg) 70  Mean BP 83  Pulse Ox % Pulse Ox % 97  Oxygen Delivery Room Air/ 21 %   Brief Assessment:  Cardiac Regular   Respiratory clear BS   Gastrointestinal details normal Soft  Nondistended  Bowel sounds normal  No rebound tenderness  mild epigastric/ruq discomfort to palpation   Lab Results: Routine Chem:  24-Jan-16 06:22   Glucose, Serum  136  BUN  3  Creatinine (comp) 0.96  Sodium, Serum 138  Potassium, Serum 3.6  Chloride, Serum 100  CO2, Serum  33  Calcium (Total), Serum  7.8  Anion Gap  5  Osmolality (calc) 274  eGFR (African American) >60  eGFR (Non-African American) >60 (eGFR values <57m/min/1.73 m2 may be an indication of chronic kidney disease (CKD). Calculated eGFR, using the MRDR Study equation, is useful in  patients with stable renal function. The eGFR calculation will not be reliable in acutely ill patients when serum creatinine is changing rapidly. It is not useful in patients on dialysis. The eGFR calculation may not be applicable to patients at the low and high extremes of body sizes, pregnant women, and vegetarians.)  Triglycerides, Serum  1348 (Result(s) reported on 03 Nov 2014 at 07:08AM.)  Routine Hem:  24-Jan-16 06:22   WBC (CBC)  3.3  RBC (CBC) 4.58  Hemoglobin (CBC)  12.1  Hematocrit (CBC)  37.1  Platelet Count (CBC) 154  MCV 81  MCH 26.3  MCHC 32.5  RDW  17.4  Neutrophil % 35.1  Lymphocyte % 50.8  Monocyte % 8.4  Eosinophil % 4.1  Basophil % 1.6  Neutrophil #  1.1  Lymphocyte # 1.7  Monocyte # 0.3  Eosinophil # 0.1   Basophil # 0.1 (Result(s) reported on 03 Nov 2014 at 06:39AM.)   Radiology Results: [place]    23-Jan-16 09:31, [place]  [place]   REASON FOR EXAM:    recurrent pancreatitis, r/o pancreatic pseudocyst.  COMMENTS:       PROCEDURE: [place]  - Nov 02 2014  9:31AM     CLINICAL DATA:  History of recurrent pancreatitis, evaluate for  possible pancreatic pseudocyst    EXAM:  ULTRASOUND ABDOMEN COMPLETE    COMPARISON:  07/24/2014    FINDINGS:  Gallbladder: Minimal gallbladder sludge is noted. No wall thickening  or cholelithiasis is seen.    Common bile duct: Diameter: 4 mm    Liver: Diffusely increased in echogenicity consistent with fatty  infiltration. No definitive mass lesion is noted.    IVC: Not well visualized    Pancreas: Not well visualized    Spleen: Size and appearance within normal limits.    Right Kidney: Length: 12.9 cm.. Echogenicity within normal limits.  No mass or hydronephrosis visualized.  Left Kidney: Length: 13.2 cm.. Echogenicity within normal limits. No  mass or hydronephrosis visualized.    Abdominal aorta: No aneurysm visualized.    Other findings: None.     IMPRESSION:  Minimal gallbladder sludge.    Fatty infiltration of the liver.    Pancreas is not well visualized due to overlying  bowel gas.    Electronically Signed    By: Inez Catalina M.D.    On: 11/02/2014 09:39         Verified By: Everlene Farrier, M.D.,   Assessment/Plan:  Assessment/Plan:  Assessment 1) recurrent pancreatitis, secondary to hypertriglycerides.  slow resolution of abdominal pain, concern for possible pud.  2) OSA/on cpap 3) patient considering bariatric surgery.  4) medical issues including IDDM, htn, depression.   Plan 1) possible EGD tomorrow pm, depending on respiratory status.  will recheck labs in am. I have discussed the risks benefits and complications of egd to include not limited to bleeding  infection perforation and sedation and he wishes to proceed.   Electronic Signatures: Loistine Simas (MD)  (Signed 24-Jan-16 13:28)  Authored: Chief Complaint, VITAL SIGNS/ANCILLARY NOTES, Brief Assessment, Lab Results, Radiology Results, Assessment/Plan   Last Updated: 24-Jan-16 13:28 by Loistine Simas (MD)

## 2015-02-09 NOTE — Consult Note (Signed)
Chief Complaint and History:  Referring Physician Dr. Tressia Miners   Chief Complaint Hypertriglyceridemia   Allergies:  Codeine: Hives  IVP Dye: Other  Assessment/Plan:  Assessment/Plan 39 yo M seen in consultation for hypertriglyceridemia -- reviewed chart, interviewed and examined patient.  Recommendations: -Agree with DC medications -Needs better compliance with insulin pump. Stressed at length the need to caount carbs and bolus insulin before meals appropriately. Needs to check sugars at least 4x daily. -Will have him meet as out-pt with CDE for nutritonal couseling -Will see him in f/up in 2-4 weeks.  Full consult will be dictated.   Electronic Signatures: Judi Cong (MD)  (Signed 26-Jan-16 17:27)  Authored: Chief Complaint and History, ALLERGIES, Assessment/Plan   Last Updated: 26-Jan-16 17:27 by Judi Cong (MD)

## 2015-02-09 NOTE — Consult Note (Signed)
Chief Complaint:  Subjective/Chief Complaint some increased abdominal discomfort after [place] this am.  pain currently 5/10.  no nauea or emesis,   VITAL SIGNS/ANCILLARY NOTES: **Vital Signs.:   23-Jan-16 08:00  Temperature Temperature (F) 98.3  Celsius 36.8  Pulse Pulse 60  Respirations Respirations 12  Systolic BP Systolic BP 932  Diastolic BP (mmHg) Diastolic BP (mmHg) 77  Mean BP 90  Pulse Ox % Pulse Ox % 100  Pulse Ox Activity Level  At rest  Oxygen Delivery Non-invasive ventilation (CPAP/BIPAP)   Brief Assessment:  Cardiac Regular   Respiratory clear BS   Gastrointestinal details normal Soft  Nondistended  No masses palpable  Bowel sounds normal  No rebound tenderness  mild to moderate discomfort in the epigastrum.   Lab Results: Routine Chem:  23-Jan-16 04:51   Glucose, Serum  181  BUN  3  Creatinine (comp) 0.80  Sodium, Serum 137  Potassium, Serum 3.5  Chloride, Serum 99  CO2, Serum 29  Calcium (Total), Serum  7.6  Anion Gap 9  Osmolality (calc) 275  eGFR (African American) >60  eGFR (Non-African American) >60 (eGFR values <27m/min/1.73 m2 may be an indication of chronic kidney disease (CKD). Calculated eGFR, using the MRDR Study equation, is useful in  patients with stable renal function. The eGFR calculation will not be reliable in acutely ill patients when serum creatinine is changing rapidly. It is not useful in patients on dialysis. The eGFR calculation may not be applicable to patients at the low and high extremes of body sizes, pregnant women, and vegetarians.)  Result Comment POTASSIUM/BUN/CREATININE - Slight hemolysis, interpret results with  - caution.  Result(s) reported on 02 Nov 2014 at 06:13AM.  Result Comment CBC - SMEAR SCANNED  Result(s) reported on 02 Nov 2014 at [facility]  Triglycerides, Serum  1807 (Result(s) reported on 02 Nov 2014 at 06:14AM.)  Routine Hem:  23-Jan-16 04:51   WBC (CBC)  3.3  RBC (CBC) 4.58  Hemoglobin (CBC)  12.5   Hematocrit (CBC)  36.8  Platelet Count (CBC) 156  MCV 80  MCH 27.3  MCHC 34.0  RDW  17.0  Neutrophil % 36.0  Lymphocyte % 47.9  Monocyte % 11.1  Eosinophil % 3.3  Basophil % 1.7  Neutrophil #  1.2  Lymphocyte # 1.6  Monocyte # 0.4  Eosinophil # 0.1  Basophil # 0.1   Radiology Results: [place]    23-Jan-16 09:31, [place] General [place]  [place]   REASON FOR EXAM:    recurrent pancreatitis, r/o pancreatic pseudocyst.  COMMENTS:       PROCEDURE: [place] - [place]DOMEN GENERAL SURVEY  - Nov 02 2014  9:31AM     CLINICAL DATA:  History of recurrent pancreatitis, evaluate for  possible pancreatic pseudocyst    EXAM:  ULTRASOUND ABDOMEN COMPLETE    COMPARISON:  07/24/2014    FINDINGS:  Gallbladder: Minimal gallbladder sludge is noted. No wall thickening  or cholelithiasis is seen.    Common bile duct: Diameter: 4 mm    Liver: Diffusely increased in echogenicity consistent with fatty  infiltration. No definitive mass lesion is noted.    IVC: Not well visualized    Pancreas: Not well visualized    Spleen: Size and appearance within normal limits.    Right Kidney: Length: 12.9 cm.. Echogenicity within normal limits.  No mass or hydronephrosis visualized.  Left Kidney: Length: 13.2 cm.. Echogenicity within normal limits. No  mass or hydronephrosis visualized.    Abdominal  aorta: No aneurysm visualized.    Other findings: None.     IMPRESSION:  Minimal gallbladder sludge.    Fatty infiltration of the liver.    Pancreas is not well visualized due to overlying bowel gas.    Electronically Signed    By: Inez Catalina M.D.    On: 11/02/2014 09:39         Verified By: Everlene Farrier, M.D.,   Assessment/Plan:  Assessment/Plan:  Assessment 1) recurrent pancreatitis, likely related to hypertriglyceridemia. stable, triglycerides improving slowly, still elevated.  Pancreas not well visualized on [place], CBD normal dimension. Of note there is some  gallbladder sludge-this not seen on last [place], likely not contributatory to current episode.  2) IDDM   Plan 1) continue current, will plan for egd early in the week hopefully monday.  continue ppi and carafate as you are. following.   Electronic Signatures: Loistine Simas (MD)  (Signed 23-Jan-16 13:14)  Authored: Chief Complaint, VITAL SIGNS/ANCILLARY NOTES, Brief Assessment, Lab Results, Radiology Results, Assessment/Plan   Last Updated: 23-Jan-16 13:14 by Loistine Simas (MD)

## 2015-02-09 NOTE — Consult Note (Signed)
Brief Consult Note: Diagnosis: recurrent pancreatitis secondary to hypertriglyceridemia, epigastric abdominal pain.   Patient was seen by consultant.   Consult note dictated.   Recommend further assessment or treatment.   Comments: Please see full Gi consult [phone].  Patietn admitted with epigastric abd pain and evaluation c/w recurrent a pancreatitis from elevated triglycerides.   Triglycerides stiil very elevated (over 2500 today, threshold for pancreatitis from this etiology though to be about 600), with resolution of lipase.  Positive h/o DU and esophagitis, h pylori negativwe in 02/2008.  Patietn takes a ppi daily and does not take nsaids except 81 asa.   Recommend abd [place] for imaging of pancreas as previous studies showed concern for pseudocyst, EGD on monday, continue current meds/ppi/carafate.  Will follow with you.  Electronic Signatures: Loistine Simas (MD)  (Signed 22-Jan-16 15:27)  Authored: Brief Consult Note   Last Updated: 22-Jan-16 15:27 by Loistine Simas (MD)

## 2015-02-09 NOTE — H&P (Signed)
PATIENT NAME:  Sergio [name], Sergio [name] MR#:  [id number] DATE OF BIRTH:  1976-04-08  DATE OF ADMISSION:  10/28/2014  REFERRING PHYSICIAN: Larae Grooms, MD   PRIMARY CARE PHYSICIAN: [name], MD  CHIEF COMPLAINT: Abdominal pain.  HISTORY OF PRESENT ILLNESS: A 39 year old Caucasian male with history of type 2 diabetes insulin requiring complicated by peripheral neuropathy, as well as hypertriglyceridemia, presenting with abdominal pain. He states "I have pancreatitis" and describes 1 day duration of abdominal pain, epigastric in location, radiation to the back, sharp in quality, 9-10 intensity, worse with p.o. intake, no relieving factors, associated with nausea and vomiting. He has had a multitude of admissions related to pancreatitis secondary to hypertriglyceridemia with triglycerides registering greater than 4000. After receiving some IV Dilaudid, his pain has decreased to a 4/10.   REVIEW OF SYSTEMS: CONSTITUTIONAL: Denies fevers, chills, fatigue. Positive for weakness. EYES: Denies blurred vision, double vision, eye pain. EARS, NOSE, AND THROAT: Denies tinnitus, ear pain, hearing loss. RESPIRATORY: Denies cough, wheeze, shortness of breath. CARDIOVASCULAR: Denies chest pain, palpitations, edema. GASTROINTESTINAL: Positive for nausea, vomiting, abdominal pain as described above. Denies any diarrhea or constipation.  GENITOURINARY: Denies dysuria or hematuria.  ENDOCRINE: Denies nocturia or thyroid problems. HEMATOLOGIC AND LYMPHATICS: Denies easy bruising or bleeding. SKIN: Denies rash or lesion. MUSCULOSKELETAL: Denies pain in neck, back, shoulder, knees, hips or arthritic symptoms. NEUROLOGICAL: Denies paralysis, paresthesias. PSYCHIATRIC: Denies anxiety or depression.  Otherwise, full review of systems performed by me is negative.   PAST MEDICAL HISTORY: Includes type 2 diabetes insulin requiring complicated by peripheral neuropathy, hypertriglyceridemia with recurrent pancreatitis,  essential hypertension, obstructive sleep apnea on CPAP, as well as depression not otherwise specified.   SOCIAL HISTORY: Denies any alcohol, tobacco or drug usage.   FAMILY HISTORY: The patient is adopted, does not know his family history.   ALLERGIES: CODEINE AS WELL AS IV CONTRAST DYE.   HOME MEDICATIONS: Include aspirin 81 mg p.o. daily, lisinopril 20 mg p.o. daily, gabapentin 300 mg p.o. at bedtime, sertraline 100 mg p.o. at bedtime, Humulin insulin via insulin pump, gemfibrozil 600 mg p.o. b.i.d., alprazolam 0.5 mg p.o. at bedtime as needed for sleep, Coreg 6.25 mg p.o. b.i.d., Lovaza 1000 mg 4 capsules daily, Prilosec 20 mg p.o. daily.   PHYSICAL EXAMINATION:  VITAL SIGNS: Temperature 98.3, heart rate of 92, respirations of 18, blood pressure 135/93, saturating 96% on room air. Weight 115.7 kg, BMI of 36.6.   GENERAL: Well-nourished, well-developed Caucasian gentleman who is currently in minimal distress given abdominal pain.  HEAD: Normocephalic, atraumatic.  EYES: Pupils equal, round, reactive to light. Extraocular muscles intact. No scleral icterus.  MOUTH: Moist mucosal membranes. Dentition intact. No abscess noted.  EARS, NOSE, THROAT: Clear without exudates. No external lesions.  NECK: Supple. No thyromegaly. No nodules. No JVD.  PULMONARY: Clear to auscultation bilaterally without wheezes, rales or rhonchi. No use of accessory muscles. Good respiratory effort.  CHEST: Nontender to palpation.  CARDIOVASCULAR: S1, S2, regular rate and rhythm. No murmurs, rubs or gallops. No edema. Pedal pulses 2+ bilaterally. ABDOMEN: Soft, nondistended. Minimal tenderness to palpation over epigastric region without rebound, guarding or motion tenderness. Hypoactive bowel sounds. No hepatosplenomegaly.  MUSCULOSKELETAL: No swelling, clubbing or edema. Range of motion full in all extremities.  NEUROLOGIC: Cranial nerves II-XII intact. No gross focal neurological deficits. Sensation intact. Reflexes  intact  SKIN: Area over the right elbow of erupted xanthoma. Otherwise, no further lesions, rashes or cyanosis. Skin warm, dry. Turgor intact.  PSYCHIATRIC: Mood and affect within  normal limits. The patient is awake, alert, oriented x 3. Insight and judgment intact.   LABORATORY DATA: Some of his information is still pending; however, sodium of 122, potassium 3.3, chloride of 85, bicarb of 27, and glucose of 418. Lipase of 2049. LFTs: Albumin of 2.7, bilirubin of 2.3 WBC of 6.7, hemoglobin 16.1, platelets of 195,000. Urinalysis negative for evidence of infection.   ASSESSMENT AND PLAN: A 39 year old Caucasian gentleman with history of diabetes insulin requiring complicated by neuropathy as well as hypertriglyceridemia with recurrent pancreatitis.  1.  Acute pancreatitis secondary hypertriglyceridemia. Documented triglycerides greater than 4000 in the past. We will initiate insulin drip at 0.1 units/kg/hour, which is a non-titratable drip with goal blood glucose between 150 and 200. We will give him dextrose if required. Will stay on insulin drip until triglycerides are less than 500 and followup the triglyceride level every 24 hours. Continue his gemfibrozil as well as Lovaza. For now, IV fluid hydration with normal saline and provide pain medications as required.  2.  Type 2 diabetes complicated by neuropathy. Hold his insulin home dose oral agents. Add insulin drip as stated above.  3.  Hypertension. Lisinopril and Coreg.  4.  Hypokalemia. Replace potassium, goal 4 to 5.  5.  Neuropathy. Continue with gabapentin.  6.  Gastroesophageal reflux disease. PPI therapy. 7.  Venous thromboembolism prophylaxis with heparin subcutaneous.   CODE STATUS: The patient is a full code.   TIME SPENT: Forty-five minutes.    ____________________________ Aaron Mose. Hower, MD dkh:TT D: 10/28/2014 21:50:33 ET T: 10/28/2014 22:08:04 ET JOB#: [id number]  cc: Aaron Mose. Hower, MD, <Dictator> DAVID Woodfin Ganja  MD ELECTRONICALLY SIGNED 10/30/2014 3:20

## 2015-02-09 NOTE — Consult Note (Signed)
PATIENT NAME:  Sergio [name], Sergio [name] MR#:  [id number] DATE OF BIRTH:  [birth year]  DATE OF CONSULTATION:  11/01/2014  REFERRING PHYSICIAN:  Dr. Lavetta Nielsen CONSULTING PHYSICIAN:  Lollie Sails, MD  REASON FOR CONSULTATION: Concern for possible gastric ulcer/peptic ulcer disease.   HISTORY OF PRESENT ILLNESS: Sergio [name] is a 39 year old Caucasian male who was in his usual state of health until a couple of days ago. At that time, he began to have some epigastric pain associated with nausea and vomiting. He had had similar symptoms in the past and a known history of pancreatitis. He felt this was the same thing and he came to the hospital. He was admitted on January 18th, that is 4 days ago. Although his pancreatic enzymes have trended down, he continues to have significant pain. Currently, he states he is a 5 to 6/10. His history of pancreatitis is secondary to hypertriglyceridemia. He states the last time he had any emesis was yesterday. He denies any hematemesis or black stools. He has a history of peptic ulcer disease with an EGD being done on 03/04/2008 showing a duodenal ulcer, as well as some reflux-related esophagitis. He has been taking omeprazole on a regular basis at home. It is of note that biopsies taken during his EGD in 2009 were negative for Helicobacter pylori. He does take an 81 mg aspirin daily, but denies the use of other aspirins or NSAID products. His last bout of pancreatitis was in December of 2015. Currently, he is hemodynamically stable, feeling some better than on admission, although continues with some epigastric abdominal pain.   PAST MEDICAL HISTORY: 1.  Type 2 insulin requiring diabetes with multiple complications including peripheral neuropathy and hypertriglyceridemia.  2.  Recurrent pancreatitis secondary to hypertriglyceridemia.  3.  Hypertension.  4.  Obstructive sleep apnea, uses a CPAP.  5.  Depression.   It is of note that his triglycerides were greater than 4000 on  admission and today were at 2586. His lipase on admission was 2049; it was 44 yesterday. Multiple of his testing has been inaccurate or incomplete due to the hypertriglyceridemia perturbing results.   SOCIAL HISTORY: He does not use any alcohol, tobacco or illicit drugs.   GASTROINTESTINAL FAMILY HISTORY: Not known. The patient is adopted.  REVIEW OF SYSTEMS: Ten systems reviewed, per admission history and physical, agree with same.   OUTPATIENT MEDICATIONS: Include alprazolam 0.5 mg once a day p.r.n. for sleep, 81 mg aspirin daily, carvedilol 6.25 mg q. 12 hours, gabapentin 300 mg once a day at bedtime, gemfibrozil 600 mg twice a day, Humulin R, lisinopril 20 mg once a day, Lovaza 1000 mg capsule 4 capsules once a day, omeprazole 20 mg once a day, sertraline 100 mg once a day.   ALLERGIES: CODEINE AND IVP DYE.  PHYSICAL EXAMINATION: VITAL SIGNS: Temperature is 98.6, pulse 78, respirations 14, blood pressure 106/75, pulse ox 92%, up to 98%.  GENERAL: He is a well-appearing 39 year old Caucasian male in no acute distress.  HEENT: Normocephalic, atraumatic. Eyes are anicteric. Nose with septum midline. No lesions. Oropharynx with no lesions.  NECK: Supple. No JVD.  HEART: Regular rate and rhythm.  LUNGS: Clear.  ABDOMEN: Soft. There is some tenderness to palpation in the epigastric region. There is minimal tenderness throughout the rest of the abdomen, perhaps somewhat in the left lower quadrant. Bowel sounds positive, normoactive. The patient does report passing flatus today. There are no masses, rebound, or organomegaly.  EXTREMITIES: No clubbing, cyanosis, or edema.  NEUROLOGICAL: Cranial nerves  II through XII grossly intact. Muscle strength bilaterally equal and symmetric.   DIAGNOSTIC DATA: Laboratories today include the following: He had a glucose of 146, BUN and creatinine could not be resulted, Sodium was 134, potassium 3.6, chloride 95, bicarb 33. His anion gap was 6. His triglycerides  today 2,586. A hepatic profile showed a total protein of 6.8, albumin 3, total bilirubin 0.7, direct bilirubin less than 0.1, alkaline phosphatase 47, AST 53, and ALT could not be resulted. His last hemogram was done 3 days ago showing a white count of 5.3, H and H 12.4/37.4, platelet count 256,000 and MCV was 82.   On admission to the hospital, he had a urinalysis that was negative, with the exception of 100 mg/dL protein and trace of ketones.   He had an EKG with sinus rhythm, possible PACs with aberrant conduction, otherwise normal.   ASSESSMENT: Recurrent pancreatitis, likely secondary to hypertriglyceridemia with extremely high triglycerides. Differential diagnosis would include biliary. He does not drink alcohol. Low probability of peptic ulcer disease as he takes a proton pump inhibitor on a regular basis at home. It is of note the patient had a MRCP on 03/21/2014 showing pancreatitis, mostly in the head and uncinate process of the pancreas. He did show hepatomegaly as well as hepatic steatosis.   RECOMMENDATIONS: 1.  Continue pantoprazole and Carafate as you are. Agree with clear liquid diet at this point. Typically pancreatitis secondary to hypertriglyceridemia resolves more slowly.  2.  Agree with EGD for further luminal evaluation. Recommend doing this on Monday. It is of note that the patient is planning to undergo gastric bypass surgery soon.  3.  I would repeat imaging, this being abdominal ultrasound. There was not any evidence of obstruction on his hepatic panel this morning. However, previous imaging did indicate possibility of early pseudocyst in the past.  4.  We will follow with you.   ____________________________ Lollie Sails, MD mus:sb D: 11/01/2014 15:22:47 ET T: 11/01/2014 16:12:59 ET JOB#: [id number]  cc: Lollie Sails, MD, <Dictator> Lollie Sails MD ELECTRONICALLY SIGNED 11/04/2014 13:41

## 2015-04-30 IMAGING — CR DG CHEST 2V
1 series · 2 of 2 positions shown · non-contrast
Comparison: 02/17/2013

CLINICAL DATA: Bariatric surgery clearance for morbid obesity.

EXAM:
CHEST  2 VIEW

[Series 1: dxr chest pa (or ap) and lateral · 0.14mm/px · 2 of 2 slices shown]
[im 1/2]
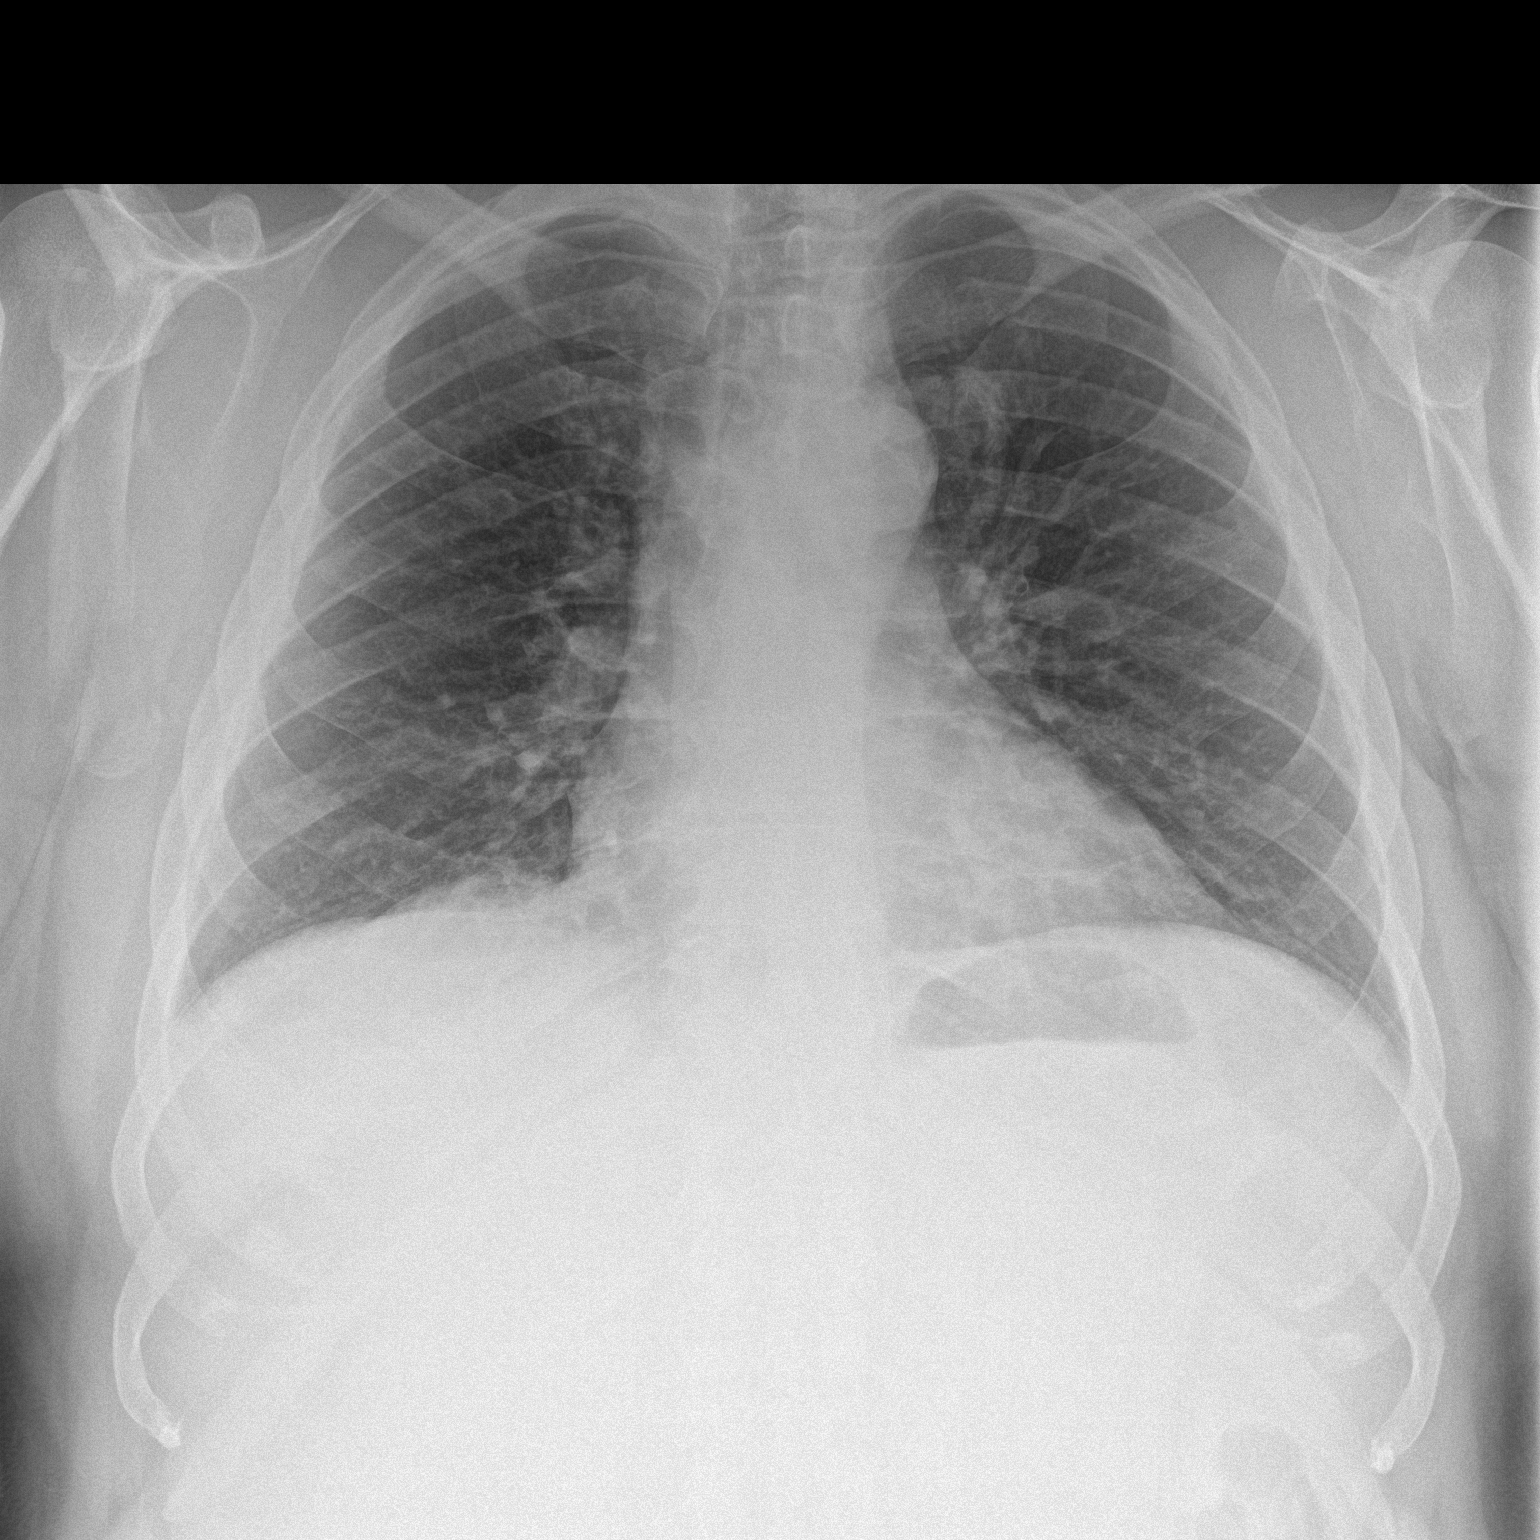
[im 2/2]
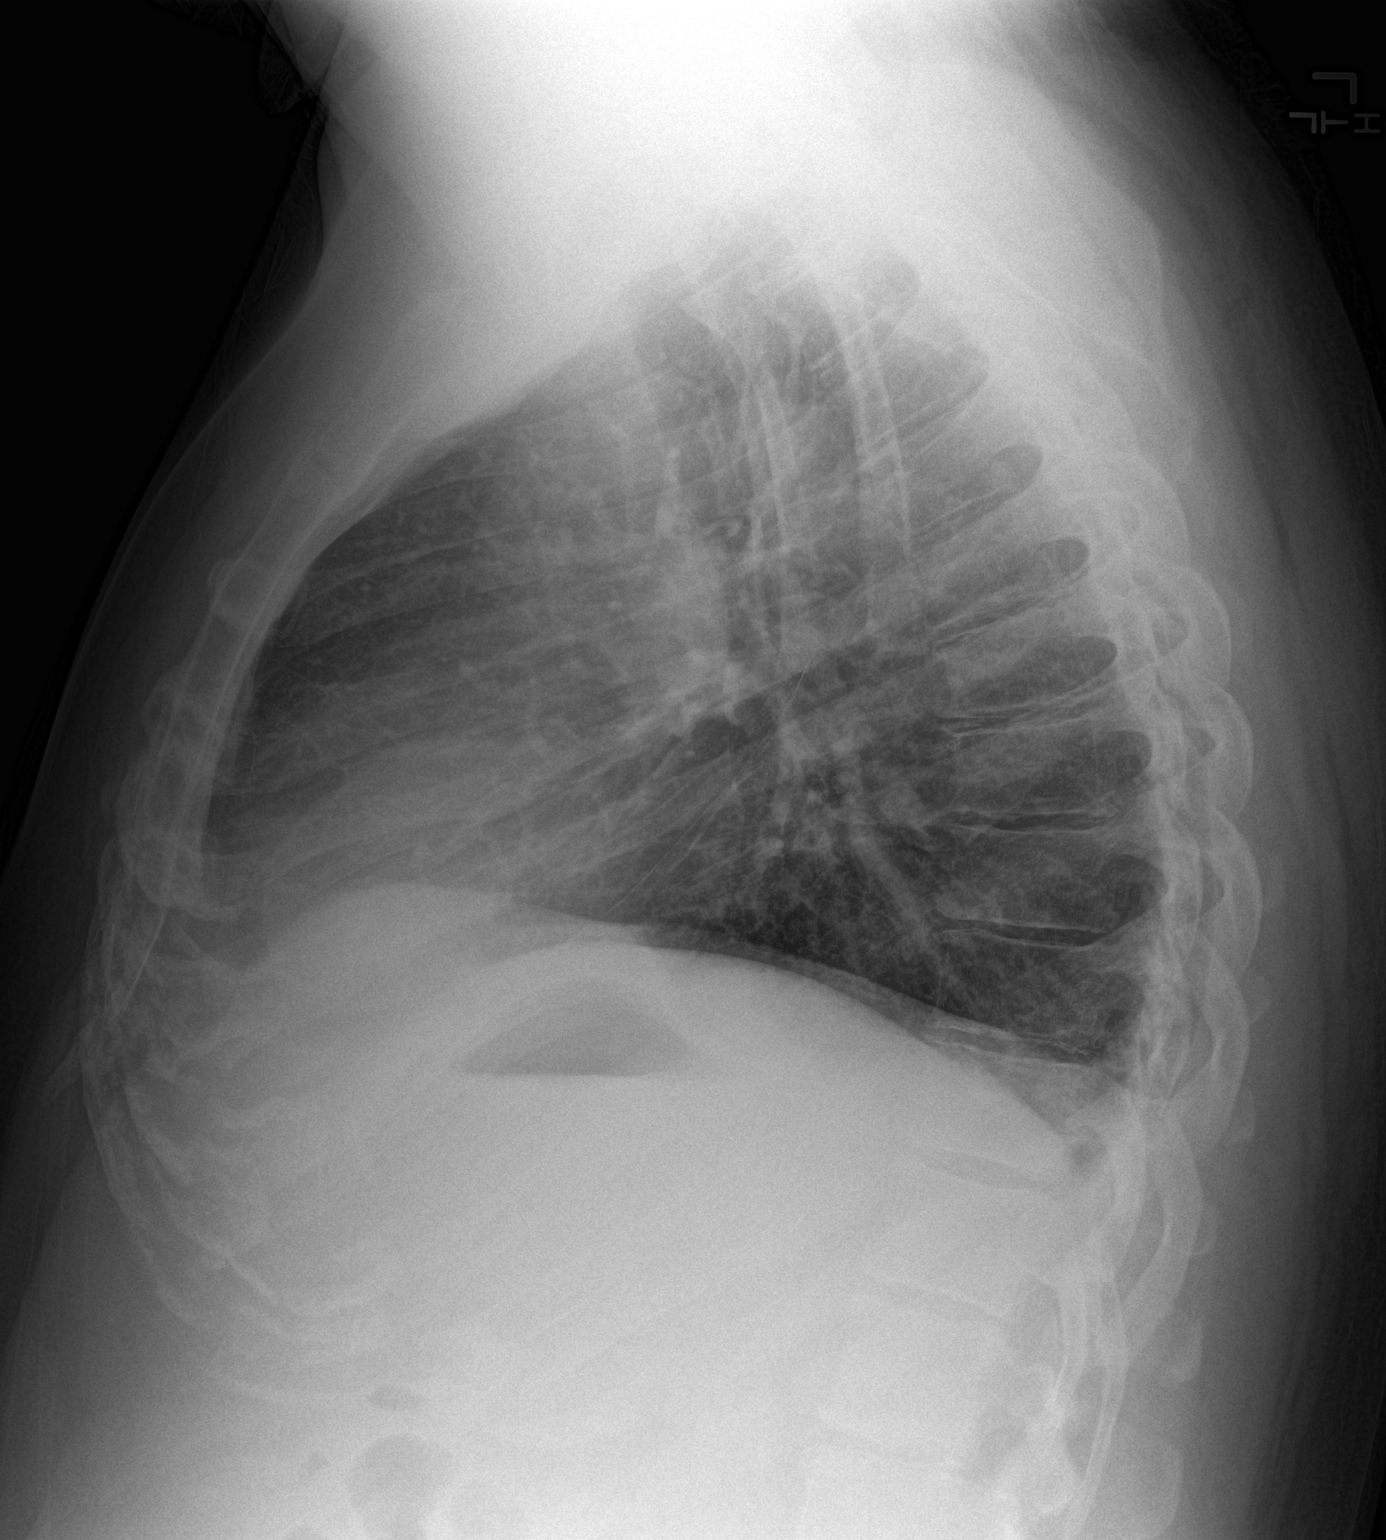

[2 of 2 positions shown; findings below may reference images not displayed]

FINDINGS: Low lung volumes accentuate cardiac size and bronchovascular
markings. Accounting for this, The heart size and mediastinal
contours are within normal limits. Both lungs are clear. The
visualized skeletal structures are unremarkable.
IMPRESSION: No active cardiopulmonary disease.  Similar appearance priors.

## 2015-08-01 ENCOUNTER — Encounter: Payer: Self-pay | Admitting: *Deleted

## 2015-08-01 ENCOUNTER — Inpatient Hospital Stay
Admission: EM | Admit: 2015-08-01 | Discharge: 2015-08-15 | DRG: 642 | Disposition: A | Discharge status: Home / Self Care | Payer: BLUE CROSS/BLUE SHIELD | Attending: Internal Medicine | Admitting: Internal Medicine

## 2015-08-01 DIAGNOSIS — Z9884 Bariatric surgery status: Secondary | ICD-10-CM

## 2015-08-01 DIAGNOSIS — F419 Anxiety disorder, unspecified: Secondary | ICD-10-CM | POA: Diagnosis present

## 2015-08-01 DIAGNOSIS — D892 Hypergammaglobulinemia, unspecified: Secondary | ICD-10-CM | POA: Diagnosis present

## 2015-08-01 DIAGNOSIS — I10 Essential (primary) hypertension: Secondary | ICD-10-CM | POA: Diagnosis present

## 2015-08-01 DIAGNOSIS — Z79891 Long term (current) use of opiate analgesic: Secondary | ICD-10-CM

## 2015-08-01 DIAGNOSIS — Z6832 Body mass index (BMI) 32.0-32.9, adult: Secondary | ICD-10-CM

## 2015-08-01 DIAGNOSIS — E871 Hypo-osmolality and hyponatremia: Secondary | ICD-10-CM | POA: Diagnosis present

## 2015-08-01 DIAGNOSIS — E781 Pure hyperglyceridemia: Secondary | ICD-10-CM | POA: Diagnosis not present

## 2015-08-01 DIAGNOSIS — Z794 Long term (current) use of insulin: Secondary | ICD-10-CM | POA: Diagnosis not present

## 2015-08-01 DIAGNOSIS — E876 Hypokalemia: Secondary | ICD-10-CM | POA: Diagnosis present

## 2015-08-01 DIAGNOSIS — T8172XA Complication of vein following a procedure, not elsewhere classified, initial encounter: Secondary | ICD-10-CM | POA: Diagnosis not present

## 2015-08-01 DIAGNOSIS — E1165 Type 2 diabetes mellitus with hyperglycemia: Secondary | ICD-10-CM | POA: Diagnosis present

## 2015-08-01 DIAGNOSIS — G4733 Obstructive sleep apnea (adult) (pediatric): Secondary | ICD-10-CM | POA: Diagnosis present

## 2015-08-01 DIAGNOSIS — E669 Obesity, unspecified: Secondary | ICD-10-CM | POA: Diagnosis present

## 2015-08-01 DIAGNOSIS — Y92239 Unspecified place in hospital as the place of occurrence of the external cause: Secondary | ICD-10-CM | POA: Diagnosis not present

## 2015-08-01 DIAGNOSIS — Z9049 Acquired absence of other specified parts of digestive tract: Secondary | ICD-10-CM

## 2015-08-01 DIAGNOSIS — E875 Hyperkalemia: Secondary | ICD-10-CM | POA: Diagnosis present

## 2015-08-01 DIAGNOSIS — F329 Major depressive disorder, single episode, unspecified: Secondary | ICD-10-CM | POA: Diagnosis present

## 2015-08-01 DIAGNOSIS — K859 Acute pancreatitis without necrosis or infection, unspecified: Secondary | ICD-10-CM | POA: Diagnosis present

## 2015-08-01 DIAGNOSIS — I809 Phlebitis and thrombophlebitis of unspecified site: Secondary | ICD-10-CM | POA: Diagnosis not present

## 2015-08-01 DIAGNOSIS — Z91041 Radiographic dye allergy status: Secondary | ICD-10-CM | POA: Diagnosis not present

## 2015-08-01 DIAGNOSIS — Y848 Other medical procedures as the cause of abnormal reaction of the patient, or of later complication, without mention of misadventure at the time of the procedure: Secondary | ICD-10-CM | POA: Diagnosis not present

## 2015-08-01 DIAGNOSIS — K219 Gastro-esophageal reflux disease without esophagitis: Secondary | ICD-10-CM | POA: Diagnosis present

## 2015-08-01 DIAGNOSIS — Z885 Allergy status to narcotic agent status: Secondary | ICD-10-CM

## 2015-08-01 DIAGNOSIS — F411 Generalized anxiety disorder: Secondary | ICD-10-CM | POA: Diagnosis present

## 2015-08-01 DIAGNOSIS — Z79899 Other long term (current) drug therapy: Secondary | ICD-10-CM

## 2015-08-01 DIAGNOSIS — R1013 Epigastric pain: Secondary | ICD-10-CM

## 2015-08-01 HISTORY — DX: Pure hyperglyceridemia: E78.1

## 2015-08-01 HISTORY — DX: Morbid (severe) obesity due to excess calories: E66.01

## 2015-08-01 HISTORY — DX: Acute pancreatitis without necrosis or infection, unspecified: K85.90

## 2015-08-01 LAB — BASIC METABOLIC PANEL
Anion gap: 9 (ref 5–15)
BUN: 14 mg/dL (ref 6–20)
CHLORIDE: 84 mmol/L — AB (ref 101–111)
CO2: 21 mmol/L — AB (ref 22–32)
Calcium: 8.2 mg/dL — ABNORMAL LOW (ref 8.9–10.3)
Glucose, Bld: 172 mg/dL — ABNORMAL HIGH (ref 65–99)
POTASSIUM: 3 mmol/L — AB (ref 3.5–5.1)
Sodium: 114 mmol/L — CL (ref 135–145)

## 2015-08-01 LAB — CBC
HEMATOCRIT: 39.3 % — AB (ref 40.0–52.0)
HEMOGLOBIN: 14.4 g/dL (ref 13.0–18.0)
MCH: 34.9 pg — AB (ref 26.0–34.0)
MCHC: 36.6 g/dL — AB (ref 32.0–36.0)
MCV: 81.3 fL (ref 80.0–100.0)
Platelets: 294 10*3/uL (ref 150–440)
RBC: 4.84 MIL/uL (ref 4.40–5.90)
RDW: 15.7 % — ABNORMAL HIGH (ref 11.5–14.5)
WBC: 7.4 10*3/uL (ref 3.8–10.6)

## 2015-08-01 LAB — COMPREHENSIVE METABOLIC PANEL
ALBUMIN: 5.4 g/dL — AB (ref 3.5–5.0)
ALK PHOS: UNDETERMINED U/L (ref 38–126)
ALT: UNDETERMINED U/L (ref 17–63)
ANION GAP: 10 (ref 5–15)
AST: UNDETERMINED U/L (ref 15–41)
BUN: UNDETERMINED mg/dL (ref 6–20)
CHLORIDE: 83 mmol/L — AB (ref 101–111)
CO2: 21 mmol/L — AB (ref 22–32)
Calcium: 8.4 mg/dL — ABNORMAL LOW (ref 8.9–10.3)
Creatinine, Ser: UNDETERMINED mg/dL (ref 0.61–1.24)
GLUCOSE: 183 mg/dL — AB (ref 65–99)
POTASSIUM: 3.2 mmol/L — AB (ref 3.5–5.1)
SODIUM: 114 mmol/L — AB (ref 135–145)
Total Bilirubin: <0.1 mg/dL — ABNORMAL LOW (ref 0.3–1.2)
Total Protein: 3.9 g/dL — ABNORMAL LOW (ref 6.5–8.1)

## 2015-08-01 LAB — LIPID PANEL
CHOLESTEROL: 718 mg/dL — AB (ref 0–200)
HDL: UNDETERMINED mg/dL (ref >40)
Triglycerides: UNDETERMINED mg/dL (ref <150)

## 2015-08-01 LAB — LIPASE, BLOOD: LIPASE: 38 U/L (ref 11–51)

## 2015-08-01 MED ORDER — HEPARIN SODIUM (PORCINE) 5000 UNIT/ML IJ SOLN
5000.0000 [IU] | Freq: Three times a day (TID) | INTRAMUSCULAR | Status: DC
  Administered 2015-08-02 – 2015-08-15 (×41): 5000 [IU] via SUBCUTANEOUS
  Filled 2015-08-01 (×41): qty 1

## 2015-08-01 MED ORDER — ACETAMINOPHEN 325 MG PO TABS: 650.0000 mg | ORAL_TABLET | Freq: Four times a day (QID) | ORAL | Status: DC | PRN

## 2015-08-01 MED ORDER — ONDANSETRON 4 MG PO TBDP
ORAL_TABLET | ORAL | Status: AC
Start: 2015-08-01 — End: 2015-08-02
  Filled 2015-08-01: qty 1

## 2015-08-01 MED ORDER — HYDROMORPHONE HCL 1 MG/ML IJ SOLN
1.0000 mg | Freq: Once | INTRAMUSCULAR | Status: AC
  Administered 2015-08-01: 1 mg via INTRAVENOUS
  Filled 2015-08-01: qty 1

## 2015-08-01 MED ORDER — ACETAMINOPHEN 650 MG RE SUPP: 650.0000 mg | Freq: Four times a day (QID) | RECTAL | Status: DC | PRN

## 2015-08-01 MED ORDER — SODIUM CHLORIDE 0.9 % IV SOLN
INTRAVENOUS | Status: DC
  Administered 2015-08-02 (×2): 10.8 [IU]/h via INTRAVENOUS
  Filled 2015-08-01 (×2): qty 2.5

## 2015-08-01 MED ORDER — SODIUM CHLORIDE 0.9 % IV SOLN
Freq: Once | INTRAVENOUS | Status: AC
  Administered 2015-08-01: 22:00:00 via INTRAVENOUS

## 2015-08-01 MED ORDER — ONDANSETRON HCL 4 MG/2ML IJ SOLN
4.0000 mg | Freq: Four times a day (QID) | INTRAMUSCULAR | Status: DC | PRN
  Administered 2015-08-03 – 2015-08-04 (×4): 4 mg via INTRAVENOUS
  Filled 2015-08-01 (×5): qty 2

## 2015-08-01 MED ORDER — ONDANSETRON 4 MG PO TBDP
4.0000 mg | ORAL_TABLET | Freq: Once | ORAL | Status: AC | PRN
  Administered 2015-08-01: 4 mg via ORAL

## 2015-08-01 MED ORDER — ONDANSETRON HCL 4 MG PO TABS: 4.0000 mg | ORAL_TABLET | Freq: Four times a day (QID) | ORAL | Status: DC | PRN

## 2015-08-01 MED ORDER — HYDROMORPHONE HCL 1 MG/ML IJ SOLN
1.0000 mg | INTRAMUSCULAR | Status: DC | PRN
  Administered 2015-08-02 (×2): 1 mg via INTRAVENOUS
  Filled 2015-08-01 (×3): qty 1

## 2015-08-01 MED ORDER — SODIUM CHLORIDE 0.9 % IV SOLN
INTRAVENOUS | Status: DC
  Administered 2015-08-02: via INTRAVENOUS

## 2015-08-01 MED ORDER — ONDANSETRON HCL 4 MG/2ML IJ SOLN
4.0000 mg | Freq: Once | INTRAMUSCULAR | Status: AC
  Administered 2015-08-01: 4 mg via INTRAVENOUS
  Filled 2015-08-01: qty 2

## 2015-08-01 NOTE — ED Provider Notes (Signed)
[facility] Emergency Department Provider Note     Time seen: ----------------------------------------- 10:04 PM on 08/01/2015 -----------------------------------------    I have reviewed the triage vital signs and the nursing notes.   HISTORY  Chief Complaint Abdominal Pain    HPI Sergio [name] is a 39 y.o. male who presents to ER complaining of epigastric pain since 1:00. Patient states he ate prior to experience the pain, has vomited twice since the pain started. It is sharp pain, nothing makes better or worse. Patient states he is a long history of pancreatitis for which she received a gastric sleeve. Since the gastric sleeve has not been diabetic and has not had pancreatitis since February. His pancreatitis  is thought to be from hypertriglyceridemia but has been under control since the gastric sleeve.   Past Medical History  Diagnosis Date  . Chronic pancreatitis [facility]) May 2009    secondary to hypertirglyceridemia   . Gastric ulcer 2009    by endo  . Sleep apnea 1999    uses CPAP, auto titrates,  [facility])  . Chicken pox   . Diabetes mellitus   . HTN (hypertension)   . Hypertriglyceridemia     severe    Patient Active Problem List   Diagnosis Date Noted  . Sinusitis, acute maxillary 02/01/2015  . S/P bariatric surgery 02/01/2015  . S/P laparoscopic cholecystectomy 02/01/2015  . Pancreatitis, acute 07/21/2014  . Diarrhea 05/29/2012  . Recurrent pancreatitis ([place]) 05/29/2012  . Generalized anxiety disorder 04/29/2012  . Obesity (BMI 30-39.9) 04/29/2012  . DM (diabetes mellitus), type 2, uncontrolled w/neurologic complication ([place])   . Hypertriglyceridemia   . HTN (hypertension)   . Sleep apnea     Past Surgical History  Procedure Laterality Date  . Tonsillectomy  2003  . Vasectomy    . Lumbar disc surgery  2008  . Cholecystectomy    . Laparoscopic gastric sleeve resection      Allergies Codeine and Ivp dye  Social  History Social History  Substance Use Topics  . Smoking status: Never Smoker   . Smokeless tobacco: Never Used  . Alcohol Use: No     Comment: once month    Review of Systems Constitutional: Negative for fever. Eyes: Negative for visual changes. ENT: Negative for sore throat. Cardiovascular: Negative for chest pain. Respiratory: Negative for shortness of breath. Gastrointestinal: Positive for abdominal pain and vomiting Genitourinary: Negative for dysuria. Musculoskeletal: Negative for back pain. Skin: Negative for rash. Neurological: Negative for headaches, focal weakness or numbness.  10-point ROS otherwise negative.  ____________________________________________   PHYSICAL EXAM:  VITAL SIGNS: ED Triage Vitals  Enc Vitals Group     BP 08/01/15 2023 158/101 mmHg     Pulse Rate 08/01/15 2023 88     Resp 08/01/15 2023 20     Temp 08/01/15 2023 98.4 F (36.9 C)     Temp Source 08/01/15 2023 Oral     SpO2 08/01/15 2023 97 %     Weight 08/01/15 2023 240 lb (108.863 kg)     Height 08/01/15 2023 6' (1.829 m)     Head Cir --      Peak Flow --      Pain Score 08/01/15 2024 9     Pain Loc --      Pain Edu? --      Excl. in [place]? --     Constitutional: Alert and oriented. Well appearing and in no distress. Eyes: Conjunctivae are normal. PERRL. Normal extraocular movements.  ENT   Head: Normocephalic and atraumatic.   Nose: No congestion/rhinnorhea.   Mouth/Throat: Mucous membranes are moist.   Neck: No stridor. Cardiovascular: Normal rate, regular rhythm. Normal and symmetric distal pulses are present in all extremities. No murmurs, rubs, or gallops. Respiratory: Normal respiratory effort without tachypnea nor retractions. Breath sounds are clear and equal bilaterally. No wheezes/rales/rhonchi. Gastrointestinal: Epigastric tenderness, no rebound or guarding. Normal bowel sounds. Musculoskeletal: Nontender with normal range of motion in all extremities. No joint  effusions.  No lower extremity tenderness nor edema. Neurologic:  Normal speech and language. No gross focal neurologic deficits are appreciated. Speech is normal. No gait instability. Skin:  Skin is warm, dry and intact. No rash noted. Psychiatric: Mood and affect are normal. Speech and behavior are normal. Patient exhibits appropriate insight and judgment. ____________________________________________  EKG: Interpreted by me. Sinus rhythm with normal axis normal intervals. No evidence of hypertrophy or acute infarction. Rate is 87 bpm  ____________________________________________  ED COURSE:  Pertinent labs & imaging results that were available during my care of the patient were reviewed by me and considered in my medical decision making (see chart for details).  ____________________________________________    LABS (pertinent positives/negatives)  Labs Reviewed  COMPREHENSIVE METABOLIC PANEL - Abnormal; Notable for the following:    Sodium 114 (*)    Potassium 3.2 (*)    Chloride 83 (*)    CO2 21 (*)    Glucose, Bld 183 (*)    Calcium 8.4 (*)    Total Protein 3.9 (*)    Albumin 5.4 (*)    Total Bilirubin <0.1 (*)    All other components within normal limits  CBC - Abnormal; Notable for the following:    HCT 39.3 (*)    MCH 34.9 (*)    MCHC 36.6 (*)    RDW 15.7 (*)    All other components within normal limits  LIPASE, BLOOD  URINALYSIS COMPLETEWITH MICROSCOPIC ([facility] ONLY)  BASIC METABOLIC PANEL  LIPID PANEL   ____________________________________________  FINAL ASSESSMENT AND PLAN  Hyponatremia  Plan: Patient with labs and imaging as dictated above. Patient with markedly abnormal abnormalities including a sodium of 114. He is been started on saline here as well as given IV Dilaudid and Zofran for what is presumed to be pancreatitis. Patient states typically his presenting lipase is normal. We'll recommend admission and GI consultation with pain control.   Earleen Newport, MD   Earleen Newport, MD 08/01/15 2219

## 2015-08-01 NOTE — ED Notes (Signed)
Pt c/o epigastric pain since 1300. Pt states he ate prior to experiencing this pain. Pt c/o vomiting x 2 since the pain started. Pt denies taking meds of any kind for his pain.

## 2015-08-02 LAB — URINALYSIS COMPLETE WITH MICROSCOPIC (ARMC ONLY)
BILIRUBIN URINE: NEGATIVE
Bacteria, UA: NONE SEEN
Glucose, UA: 150 mg/dL — AB
Hgb urine dipstick: NEGATIVE
Leukocytes, UA: NEGATIVE
NITRITE: NEGATIVE
PH: 6 (ref 5.0–8.0)
Protein, ur: 100 mg/dL — AB
SPECIFIC GRAVITY, URINE: 1.025 (ref 1.005–1.030)
Squamous Epithelial / LPF: NONE SEEN

## 2015-08-02 LAB — GLUCOSE, CAPILLARY
GLUCOSE-CAPILLARY: 155 mg/dL — AB (ref 65–99)
GLUCOSE-CAPILLARY: 120 mg/dL — AB (ref 65–99)
GLUCOSE-CAPILLARY: 97 mg/dL (ref 65–99)
GLUCOSE-CAPILLARY: 116 mg/dL — AB (ref 65–99)
GLUCOSE-CAPILLARY: 123 mg/dL — AB (ref 65–99)
GLUCOSE-CAPILLARY: 74 mg/dL (ref 65–99)
GLUCOSE-CAPILLARY: 139 mg/dL — AB (ref 65–99)
GLUCOSE-CAPILLARY: 165 mg/dL — AB (ref 65–99)
GLUCOSE-CAPILLARY: 149 mg/dL — AB (ref 65–99)
GLUCOSE-CAPILLARY: 151 mg/dL — AB (ref 65–99)
Glucose-Capillary: 138 mg/dL — ABNORMAL HIGH (ref 65–99)
Glucose-Capillary: 112 mg/dL — ABNORMAL HIGH (ref 65–99)
Glucose-Capillary: 92 mg/dL (ref 65–99)
Glucose-Capillary: 76 mg/dL (ref 65–99)
Glucose-Capillary: 130 mg/dL — ABNORMAL HIGH (ref 65–99)
Glucose-Capillary: 148 mg/dL — ABNORMAL HIGH (ref 65–99)
Glucose-Capillary: 166 mg/dL — ABNORMAL HIGH (ref 65–99)
Glucose-Capillary: 155 mg/dL — ABNORMAL HIGH (ref 65–99)
Glucose-Capillary: 176 mg/dL — ABNORMAL HIGH (ref 65–99)
Glucose-Capillary: 154 mg/dL — ABNORMAL HIGH (ref 65–99)

## 2015-08-02 LAB — CBC
HCT: 35.2 % — ABNORMAL LOW (ref 40.0–52.0)
Hemoglobin: 17.9 g/dL (ref 13.0–18.0)
MCV: 80.9 fL (ref 80.0–100.0)
Platelets: 243 K/uL (ref 150–440)
RBC: 4.35 MIL/uL — ABNORMAL LOW (ref 4.40–5.90)
RDW: 15.4 % — ABNORMAL HIGH (ref 11.5–14.5)
WBC: 7.5 K/uL (ref 3.8–10.6)

## 2015-08-02 LAB — BASIC METABOLIC PANEL WITH GFR
Anion gap: 5 (ref 5–15)
CO2: 24 mmol/L (ref 22–32)
Calcium: 8 mg/dL — ABNORMAL LOW (ref 8.9–10.3)
Chloride: 93 mmol/L — ABNORMAL LOW (ref 101–111)
Glucose, Bld: 80 mg/dL (ref 65–99)
Potassium: 2.7 mmol/L — CL (ref 3.5–5.1)
Sodium: 122 mmol/L — ABNORMAL LOW (ref 135–145)

## 2015-08-02 MED ORDER — PANTOPRAZOLE SODIUM 40 MG PO TBEC
40.0000 mg | DELAYED_RELEASE_TABLET | Freq: Every day | ORAL | Status: DC
  Administered 2015-08-02 – 2015-08-15 (×13): 40 mg via ORAL
  Filled 2015-08-02 (×14): qty 1

## 2015-08-02 MED ORDER — ASPIRIN EC 81 MG PO TBEC
81.0000 mg | DELAYED_RELEASE_TABLET | Freq: Every day | ORAL | Status: DC
Start: 2015-08-02 — End: 2015-08-15
  Administered 2015-08-02 – 2015-08-15 (×13): 81 mg via ORAL
  Filled 2015-08-02 (×13): qty 1

## 2015-08-02 MED ORDER — SERTRALINE HCL 50 MG PO TABS
50.0000 mg | ORAL_TABLET | Freq: Every day | ORAL | Status: DC
Start: 2015-08-02 — End: 2015-08-15
  Administered 2015-08-02 – 2015-08-15 (×13): 50 mg via ORAL
  Filled 2015-08-02 (×14): qty 1

## 2015-08-02 MED ORDER — ALPRAZOLAM 0.5 MG PO TABS
0.5000 mg | ORAL_TABLET | Freq: Three times a day (TID) | ORAL | Status: DC | PRN
  Administered 2015-08-02 – 2015-08-14 (×13): 0.5 mg via ORAL
  Filled 2015-08-02 (×13): qty 1

## 2015-08-02 MED ORDER — CARVEDILOL 6.25 MG PO TABS
6.2500 mg | ORAL_TABLET | Freq: Two times a day (BID) | ORAL | Status: DC
  Filled 2015-08-02: qty 1

## 2015-08-02 MED ORDER — DEXTROSE 10 % IV SOLN
INTRAVENOUS | Status: DC
  Administered 2015-08-02: 150 mL via INTRAVENOUS
  Administered 2015-08-02 – 2015-08-14 (×28): via INTRAVENOUS
  Administered 2015-08-14: 150 mL/h via INTRAVENOUS

## 2015-08-02 MED ORDER — HYDROMORPHONE HCL 1 MG/ML IJ SOLN
1.0000 mg | Freq: Once | INTRAMUSCULAR | Status: AC
  Administered 2015-08-02: 1 mg via INTRAVENOUS

## 2015-08-02 MED ORDER — SERTRALINE HCL 50 MG PO TABS: 50.0000 mg | ORAL_TABLET | Freq: Every day | ORAL | Status: DC

## 2015-08-02 MED ORDER — AMPHETAMINE-DEXTROAMPHETAMINE 5 MG PO TABS
10.0000 mg | ORAL_TABLET | Freq: Every day | ORAL | Status: DC
  Filled 2015-08-02: qty 2

## 2015-08-02 MED ORDER — DEXTROSE 50 % IV SOLN
50.0000 mL | INTRAVENOUS | Status: AC
  Administered 2015-08-02: 50 mL via INTRAVENOUS

## 2015-08-02 MED ORDER — LISINOPRIL 20 MG PO TABS
20.0000 mg | ORAL_TABLET | Freq: Every day | ORAL | Status: DC
  Filled 2015-08-02: qty 1

## 2015-08-02 MED ORDER — HYDROCODONE-ACETAMINOPHEN 5-325 MG PO TABS
1.0000 | ORAL_TABLET | ORAL | Status: DC | PRN
  Administered 2015-08-02 – 2015-08-06 (×3): 1 via ORAL
  Filled 2015-08-02 (×3): qty 1

## 2015-08-02 MED ORDER — DEXTROSE 50 % IV SOLN
INTRAVENOUS | Status: AC
  Administered 2015-08-02: 50 mL via INTRAVENOUS
  Filled 2015-08-02: qty 50

## 2015-08-02 MED ORDER — POTASSIUM CHLORIDE 10 MEQ/100ML IV SOLN
10.0000 meq | INTRAVENOUS | Status: AC
  Administered 2015-08-02 (×4): 10 meq via INTRAVENOUS
  Filled 2015-08-02 (×4): qty 100

## 2015-08-02 MED ORDER — GABAPENTIN 300 MG PO CAPS: 300.0000 mg | ORAL_CAPSULE | Freq: Every day | ORAL | Status: DC

## 2015-08-02 MED ORDER — HYDROMORPHONE HCL 1 MG/ML IJ SOLN
2.0000 mg | INTRAMUSCULAR | Status: DC | PRN
  Administered 2015-08-02 – 2015-08-13 (×71): 2 mg via INTRAVENOUS
  Filled 2015-08-02 (×41): qty 2
  Filled 2015-08-02: qty 1
  Filled 2015-08-02 (×29): qty 2

## 2015-08-02 NOTE — Progress Notes (Signed)
[place] at [place] NAME: Sergio [name]    MR#:  [id number]  DATE OF BIRTH:  June 18, 1976  SUBJECTIVE:  CHIEF COMPLAINT:   Chief Complaint  Patient presents with  . Abdominal Pain   Patient here due to epigastric abdominal pain and noted to have severe hypertriglyceridemia. Having significant abdominal pain. Patient's wife is at bedside.  REVIEW OF SYSTEMS:    Review of Systems  Constitutional: Negative for fever and chills.  HENT: Negative for congestion and tinnitus.   Eyes: Negative for blurred vision and double vision.  Respiratory: Negative for cough, shortness of breath and wheezing.   Cardiovascular: Negative for chest pain, orthopnea and PND.  Gastrointestinal: Positive for nausea and abdominal pain. Negative for vomiting and diarrhea.  Genitourinary: Negative for dysuria and hematuria.  Neurological: Negative for dizziness, sensory change and focal weakness.  All other systems reviewed and are negative.   Nutrition: Clear liquid Tolerating Diet: Yes Tolerating PT:  Await evaluation    DRUG ALLERGIES:   Allergies  Allergen Reactions  . Codeine Anaphylaxis  . Ivp Dye [Iodinated Diagnostic Agents] Other (See Comments)    Kidneys stop working    VITALS:  Blood pressure 122/87, pulse 73, temperature 97.9 F (36.6 C), temperature source Oral, resp. rate 12, height 6' (1.829 m), weight 107.1 kg (236 lb 1.8 oz), SpO2 98 %.  PHYSICAL EXAMINATION:   Physical Exam  GENERAL:  39 y.o.-year-old obese patient lying in the bed in no acute distress.  EYES: Pupils equal, round, reactive to light and accommodation. No scleral icterus. Extraocular muscles intact.  HEENT: Head atraumatic, normocephalic. Oropharynx and nasopharynx clear.  NECK:  Supple, no jugular venous distention. No thyroid enlargement, no tenderness.  LUNGS: Normal breath sounds bilaterally, no wheezing, rales, rhonchi. No use of accessory muscles of  respiration.  CARDIOVASCULAR: S1, S2 normal. No murmurs, rubs, or gallops.  ABDOMEN: Soft, nondistended, tender in the epigastric area but no rebound, rigidity. Bowel sounds present. No organomegaly or mass.  EXTREMITIES: No cyanosis, clubbing or edema b/l.    NEUROLOGIC: Cranial nerves II through XII are intact. No focal Motor or sensory deficits b/l.   PSYCHIATRIC: The patient is alert and oriented x 3. Good affect SKIN: No obvious rash, lesion, or ulcer.    LABORATORY PANEL:   CBC  Recent Labs Lab 08/02/15 0456  WBC 7.5  HGB 17.9  HCT 35.2*  PLT 243   ------------------------------------------------------------------------------------------------------------------  Chemistries   Recent Labs Lab 08/01/15 2035  08/02/15 0456  NA 114*  < > 122*  K 3.2*  < > 2.7*  CL 83*  < > 93*  CO2 21*  < > 24  GLUCOSE 183*  < > 80  BUN UNABLE TO REPORT DUE TO LIPEMIC INTERFERENCE  < > SEE COMMENTS  CREATININE UNABLE TO REPORT DUE TO LIPEMIC INTERFERENCE  < > SEE COMMENTS  CALCIUM 8.4*  < > 8.0*  AST UNABLE TO REPORT DUE TO LIPEMIC INTERFERENCE  --   --   ALT UNABLE TO REPORT DUE TO LIPEMIC INTERFERENCE  --   --   ALKPHOS UNABLE TO REPORT DUE TO LIPEMIC INTERFERENCE  --   --   BILITOT <0.1*  --   --   < > = values in this interval not displayed. ------------------------------------------------------------------------------------------------------------------  Cardiac Enzymes No results for input(s): TROPONINI in the last 168 hours. ------------------------------------------------------------------------------------------------------------------  RADIOLOGY:  No results found.   ASSESSMENT AND PLAN:   39 year old male with past  medical history of hypertriglyceridemia, acute pancreatitis, diabetes, morbid obesity, obstructive sleep apnea, who presents to the hospital abdominal pain and noted to have significant hypertriglyceridemia.  #1 acute pancreatitis with  hypertriglyceridemia-continue supportive care with IV fluids, antiemetics, pain control. -Patient's lipase is actually within normal range.  #2 severe hypertriglyceridemia-continue supportive care. Continue insulin drip, dextrose. -Follow and continue insulin drip until triglyceride levels drop below 500. -Await endocrinology consult  #3 hyponatremia-likely pseudohyponatremia from the paraproteinemia and hypertriglyceridemia. -We will follow sodium levels.  #4 anxiety-continue Xanax.  #5 depression-continue Zoloft.   All the records are reviewed and case discussed with Care Management/Social Workerr. Management plans discussed with the patient, family and they are in agreement.  CODE STATUS: Full  DVT Prophylaxis: Heparin subcutaneous  TOTAL TIME TAKING CARE OF THIS PATIENT: 35 minutes.   POSSIBLE D/C IN 2-3 DAYS, DEPENDING ON CLINICAL CONDITION.   Henreitta Leber M.D on 08/02/2015 at 12:24 PM  Between 7am to 6pm - Pager - [phone]  After 6pm go to [email or web address] - password [email or web address] [facility]  Office  [phone]  CC: Primary care physician; Crecencio Mc, MD

## 2015-08-02 NOTE — H&P (Signed)
[place] at [place] NAME: Sergio [name]    MR#:  [id number]  DATE OF BIRTH:  [birth year]   DATE OF ADMISSION:  08/01/2015  PRIMARY CARE PHYSICIAN: Crecencio Mc, MD   REQUESTING/REFERRING PHYSICIAN: Jimmye Norman  CHIEF COMPLAINT:   Chief Complaint  Patient presents with  . Abdominal Pain    HISTORY OF PRESENT ILLNESS:  Sergio [name]  is a 39 y.o. male with a known history of hypertriglyceridemia who is presenting with abdominal pain. Acute onset abdominal pain epigastric in location and sharp in quality 10/10 intensity radiation to the back and no worsening/relieving factors. Of note since his last presentation to the hospital he actually underwent gastric sleeve which improved some of his symptoms he was able to come off of insulin as well as his cholesterol medication.  PAST MEDICAL HISTORY:   Past Medical History  Diagnosis Date  . Chronic pancreatitis [facility]) May 2009    secondary to hypertirglyceridemia   . Gastric ulcer 2009    by endo  . Sleep apnea 1999    uses CPAP, auto titrates,  [facility]ph[facility] Rolls)  . Chicken pox   . Diabetes mellitus   . HTN (hypertension)   . Hypertriglyceridemia     severe    PAST SURGICAL HISTORY:   Past Surgical History  Procedure Laterality Date  . Tonsillectomy  2003  . Vasectomy    . Lumbar disc surgery  2008  . Cholecystectomy    . Laparoscopic gastric sleeve resection      SOCIAL HISTORY:   Social History  Substance Use Topics  . Smoking status: Never Smoker   . Smokeless tobacco: Never Used  . Alcohol Use: No     Comment: once month    FAMILY HISTORY:   Family History  Problem Relation Age of Onset  . Adopted: Yes  . Hypertension Mother   . Hyperlipidemia Mother   . Hypertension Father   . Hyperlipidemia Father     DRUG ALLERGIES:   Allergies  Allergen Reactions  . Codeine Anaphylaxis  . Ivp Dye [Iodinated Diagnostic Agents] Other (See Comments)    Kidneys stop  working    REVIEW OF SYSTEMS:  REVIEW OF SYSTEMS:  CONSTITUTIONAL: Denies fevers, chills, fatigue, weakness.  EYES: Denies blurred vision, double vision, or eye pain.  EARS, NOSE, THROAT: Denies tinnitus, ear pain, hearing loss.  RESPIRATORY: denies cough, shortness of breath, wheezing  CARDIOVASCULAR: Denies chest pain, palpitations, edema.  GASTROINTESTINAL: Denies nausea, vomiting, diarrhea, positive abdominal pain.  GENITOURINARY: Denies dysuria, hematuria.  ENDOCRINE: Denies nocturia or thyroid problems. HEMATOLOGIC AND LYMPHATIC: Denies easy bruising or bleeding.  SKIN: Denies rash or lesions.  MUSCULOSKELETAL: Denies pain in neck, back, shoulder, knees, hips, or further arthritic symptoms.  NEUROLOGIC: Denies paralysis, paresthesias.  PSYCHIATRIC: Denies anxiety or depressive symptoms. Otherwise full review of systems performed by me is negative.   MEDICATIONS AT HOME:   Prior to Admission medications   Medication Sig Start Date End Date Taking? Authorizing Provider  ALPRAZolam Duanne Moron) 0.5 MG tablet Take 0.5 mg by mouth 3 (three) times daily as needed for anxiety.   Yes Historical Provider, MD  sertraline (ZOLOFT) 50 MG tablet Take 50 mg by mouth daily.   Yes Historical Provider, MD  amphetamine-dextroamphetamine (ADDERALL) 10 MG tablet Take 10 mg by mouth daily.    Historical Provider, MD  aspirin EC 81 MG tablet Take 81 mg by mouth daily.      Historical Provider, MD  benzonatate (TESSALON) 200 MG capsule Take 1 capsule (200 mg total) by mouth 3 (three) times daily as needed for cough. 01/29/15   Crecencio Mc, MD  carvedilol (COREG) 6.25 MG tablet Take 6.25 mg by mouth 2 (two) times daily with a meal.      Historical Provider, MD  gabapentin (NEURONTIN) 300 MG capsule Take 300 mg by mouth at bedtime.    Historical Provider, MD  levofloxacin (LEVAQUIN) 500 MG tablet Take 1 tablet (500 mg total) by mouth daily. 01/29/15   Crecencio Mc, MD  lisinopril (PRINIVIL,ZESTRIL) 20 MG  tablet Take 20 mg by mouth daily.      Historical Provider, MD  metFORMIN (GLUCOPHAGE) 500 MG tablet Take 500 mg by mouth daily with breakfast.    Historical Provider, MD  omeprazole (PRILOSEC) 20 MG capsule Take 20 mg by mouth daily.      Historical Provider, MD  sertraline (ZOLOFT) 100 MG tablet Take 1 tablet (100 mg total) by mouth daily. Patient taking differently: Take 50 mg by mouth daily.  07/27/14   Orson Eva, MD  UNABLE TO FIND Humulin R 500 U via pump.    Historical Provider, MD      VITAL SIGNS:  Blood pressure 145/96, pulse 88, temperature 97.9 F (36.6 C), temperature source Oral, resp. rate 16, height 6' (1.829 m), weight 240 lb (108.863 kg), SpO2 97 %.  PHYSICAL EXAMINATION:  VITAL SIGNS: Filed Vitals:   08/01/15 2300  BP: 145/96  Pulse: 88  Temp:   Resp: 16   GENERAL:39 y.o.male currently in no acute distress.  HEAD: Normocephalic, atraumatic.  EYES: Pupils equal, round, reactive to light. Extraocular muscles intact. No scleral icterus.  MOUTH: Moist mucosal membrane. Dentition intact. No abscess noted.  EAR, NOSE, THROAT: Clear without exudates. No external lesions.  NECK: Supple. No thyromegaly. No nodules. No JVD.  PULMONARY: Clear to ascultation, without wheeze rails or rhonci. No use of accessory muscles, Good respiratory effort. good air entry bilaterally CHEST: Nontender to palpation.  CARDIOVASCULAR: S1 and S2. Regular rate and rhythm. No murmurs, rubs, or gallops. No edema. Pedal pulses 2+ bilaterally.  GASTROINTESTINAL: Soft, minimal tenderness upon palpation no rebound, nondistended. No masses. Positive bowel sounds. No hepatosplenomegaly.  MUSCULOSKELETAL: No swelling, clubbing, or edema. Range of motion full in all extremities.  NEUROLOGIC: Cranial nerves II through XII are intact. No gross focal neurological deficits. Sensation intact. Reflexes intact.  SKIN: Eruptive xanthoma No ulceration, lesions, rashes, or cyanosis. Skin warm and dry. Turgor  intact.  PSYCHIATRIC: Mood, affect within normal limits. The patient is awake, alert and oriented x 3. Insight, judgment intact.    LABORATORY PANEL:   CBC  Recent Labs Lab 08/01/15 2035  WBC 7.4  HGB 14.4  HCT 39.3*  PLT 294   ------------------------------------------------------------------------------------------------------------------  Chemistries   Recent Labs Lab 08/01/15 2035 08/01/15 2209  NA 114* 114*  K 3.2* 3.0*  CL 83* 84*  CO2 21* 21*  GLUCOSE 183* 172*  BUN UNABLE TO REPORT DUE TO LIPEMIC INTERFERENCE 14  CREATININE UNABLE TO REPORT DUE TO LIPEMIC INTERFERENCE SEE COMMENTS  CALCIUM 8.4* 8.2*  AST UNABLE TO REPORT DUE TO LIPEMIC INTERFERENCE  --   ALT UNABLE TO REPORT DUE TO LIPEMIC INTERFERENCE  --   ALKPHOS UNABLE TO REPORT DUE TO LIPEMIC INTERFERENCE  --   BILITOT <0.1*  --    ------------------------------------------------------------------------------------------------------------------  Cardiac Enzymes No results for input(s): TROPONINI in the last 168 hours. ------------------------------------------------------------------------------------------------------------------  RADIOLOGY:  No results found.  EKG:   Orders placed or performed during the hospital encounter of 08/01/15  . EKG 12-Lead  . EKG 12-Lead    IMPRESSION AND PLAN:   39 year old Caucasian gentleman history of hypertriglyceridemia coming in with abdominal pain  1. Hypertriglyceridemia with pancreatitis: Initiate insulin drip 0.1 unit/kilo/hour non-titratable goal triglyceride level less than 500 check on daily basis, consult endocrinology, goal blood glucose 150-200 he may require dextrose in his IV fluids, pain medication 2. Essential hypertension Coreg, lisinopril 3. GERD without esophagitis: PPI therapy 4. Venous thromboembolism prophylactic: Heparin subcutaneous  All the records are reviewed and case discussed with ED provider. Management plans discussed with the  patient, family and they are in agreement.  CODE STATUS: Full code  TOTAL TIME TAKING CARE OF THIS PATIENT: 45 critical minutes.    Roya Gieselman,  [name].D on 08/02/2015 at 12:03 AM  Between 7am to 6pm - Pager - [phone]  After 6pm: House Pager: - [phone]  [facility]  Office  [phone]  CC: Primary care physician; Crecencio Mc, MD

## 2015-08-02 NOTE — Progress Notes (Signed)
CRITICAL VALUE ALERT  Critical value received:  Potassium    Date of notification:  08/02/2015  Time of notification:  6767  Critical value read back:Yes  Nurse who received alert:  E. Lovena Le RN  MD notified (1st page):  Dr. Marcille Blanco  Time of first page:    MD notified (2nd page):  Time of second page:  Responding MD:  Dr. Marcille Blanco  Time MD responded:  [phone]

## 2015-08-02 NOTE — Progress Notes (Signed)
Normal saline discontinued. Replaced with D10. Hyponatremia noted. Likely secondary to dilution due to extreme hypertriglyceridemia. Priority is to keep insulin drip running. We'll replete potassium IV as well.

## 2015-08-02 NOTE — Progress Notes (Signed)
Patient resting between care, complaints of pain currently unrelieved with dilauded 1mg  IV. Insulin gtt continues at the prescribed rate of 10.8. NS replaced with D10 to maintain blood glucose.  Further vital signs WDL, see CHL for further assessment. Will continue to assess for changes/need.

## 2015-08-02 NOTE — Progress Notes (Signed)
Order received to continue patient Insulin rate at 10.8 until seen by Endocrinology. Currently will not be using phase II of the ICU glycemic control protocol. Will continue to assess for patient changes/need.

## 2015-08-02 NOTE — Progress Notes (Signed)
Initial Nutrition Assessment   INTERVENTION:   Coordination of Care: await diet progression as medically able   NUTRITION DIAGNOSIS:   Inadequate oral intake related to acute illness as evidenced by  (CL diet order, taking sips of water).  GOAL:   Patient will meet greater than or equal to 90% of their needs  MONITOR:    (Energy Intake, Anthropometrics, Electrolyte and Renal Profile, Gastrointestinal Profile, Glucose Profile)  REASON FOR ASSESSMENT:   Diagnosis    ASSESSMENT:   Pt admitted with abdominal pain secondary to pancreatitis. Pt with h/o pancreatitis and hypertriglyceridemia. Per MD note, pt s/p gastric sleeve procedure in February 2016. Noted Endocrinology consulted.  Past Medical History  Diagnosis Date  . Chronic pancreatitis [facility]) May 2009    secondary to hypertirglyceridemia   . Gastric ulcer 2009    by endo  . Sleep apnea 1999    uses CPAP, auto titrates,  [facility] Rolls)  . Chicken pox   . Diabetes mellitus   . HTN (hypertension)   . Hypertriglyceridemia     severe   Past Surgical History  Procedure Laterality Date  . Tonsillectomy  2003  . Vasectomy    . Lumbar disc surgery  2008  . Cholecystectomy    . Laparoscopic gastric sleeve resection      Diet Order:  Diet clear liquid Room service appropriate?: Yes; Fluid consistency:: Thin    Current Nutrition: Pt reports taking sips of water this am not wanting a CL tray.   Food/Nutrition-Related History: Pt reports eating chicken nuggets yesterday. Pt reports appetite was doing well PTA.   Scheduled Medications:  . aspirin EC  81 mg Oral Daily  . heparin  5,000 Units Subcutaneous 3 times per day  . pantoprazole  40 mg Oral Daily  . potassium chloride  10 mEq Intravenous Q1 Hr x 4  . sertraline  50 mg Oral Daily    Continuous Medications:  . dextrose 150 mL/hr at 08/02/15 0700  . insulin (NOVOLIN-R) infusion 10.8 Units/hr (08/02/15 0700)     Electrolyte/Renal Profile and Glucose Profile:    Recent Labs Lab 08/01/15 2035 08/01/15 2209 08/02/15 0456  NA 114* 114* 122*  K 3.2* 3.0* 2.7*  CL 83* 84* 93*  CO2 21* 21* 24  BUN UNABLE TO REPORT DUE TO LIPEMIC INTERFERENCE 14 SEE COMMENTS  CREATININE UNABLE TO REPORT DUE TO LIPEMIC INTERFERENCE SEE COMMENTS SEE COMMENTS  CALCIUM 8.4* 8.2* 8.0*  GLUCOSE 183* 172* 80   Protein Profile:  Recent Labs Lab 08/01/15 2035  ALBUMIN 5.4*   Lipase     Component Value Date/Time   LIPASE 38 08/01/2015 2035    Lipid Panel     Component Value Date/Time   CHOL 718* 08/01/2015 2209   CHOL 357* 09/04/2014 0328   CHOL 1116* 04/27/2012 0939   TRIG UNABLE TO PERFORM DUE TO LIPEMIC INTERFERENCE 08/01/2015 2209   TRIG 1201* 11/04/2014 0427   HDL UNABLE TO PERFORM DUE TO LIPEMIC INTERFERENCE 08/01/2015 2209   HDL SEE COMMENT 09/04/2014 0328   HDL 60 04/27/2012 0939   CHOLHDL SEE COMMENTS 08/01/2015 2209   CHOLHDL 18.6* 04/27/2012 0939   VLDL SEE COMMENTS 08/01/2015 2209   VLDL SEE COMMENT 09/04/2014 0328   LDLCALC SEE COMMENTS 08/01/2015 2209   LDLCALC SEE COMMENT 09/04/2014 0328   LDLCALC Comment 04/27/2012 0939   LDLDIRECT 111.4 06/30/2012 0950    Gastrointestinal Profile: Last BM: unknown   Nutrition-Focused Physical Exam Findings:  Unable to complete Nutrition-Focused physical exam at this  time.    Weight Change: Pt s/p gastric sleeve performed in February 2016. RD notes per CHL, weight gain since last recorded weight in April 2016.  Height:   Ht Readings from Last 1 Encounters:  08/02/15 6' (1.829 m)    Weight:   Wt Readings from Last 1 Encounters:  08/02/15 236 lb 1.8 oz (107.1 kg)    Wt Readings from Last 10 Encounters:  08/02/15 236 lb 1.8 oz (107.1 kg)  01/29/15 212 lb 8 oz (96.389 kg)  07/21/14 260 lb (117.935 kg)  05/08/14 255 lb 8 oz (115.894 kg)  03/19/13 244 lb 12 oz (111.018 kg)  12/12/12 259 lb (117.482 kg)  06/30/12 258 lb 8 oz (117.255 kg)  05/29/12 249 lb (112.946 kg)  04/27/12 265 lb  (120.203 kg)  08/30/11 253 lb 12 oz (115.1 kg)    Ideal Body Weight:   80.9kg  BMI:  Body mass index is 32.02 kg/(m^2).  Estimated Nutritional Needs:   Kcal:  BEE: 1768kcals, TEE: (IF 1.1-1.3)(AF 1.2) 2332-2756kcals, using IBW of 80.9kg  Protein:  65-89g protein (0.8-1.0g/kg)   Fluid:  2023-2482mL of fluid (25-63mL/kG)  EDUCATION NEEDS:   Education needs no appropriate at this time    [place], RD, LDN Pager [phone]

## 2015-08-02 NOTE — Progress Notes (Signed)
Notified Dr. Verdell Carmine that patient is having pain and current schedule of medications he states is not helping. Oral dose of pain medication ordered and will discuss pain schedule with patient. Also notified him that there is no one here today for endocrinology and that patient is on insulin drip and D10 fluids at this time. Per night shift this drip was not ordered for titration or glucostabilizer. Dr. Verdell Carmine stated that he would evaluate when he comes to assess patient.

## 2015-08-03 LAB — GLUCOSE, CAPILLARY
GLUCOSE-CAPILLARY: 139 mg/dL — AB (ref 65–99)
GLUCOSE-CAPILLARY: 122 mg/dL — AB (ref 65–99)
GLUCOSE-CAPILLARY: 148 mg/dL — AB (ref 65–99)
GLUCOSE-CAPILLARY: 126 mg/dL — AB (ref 65–99)
GLUCOSE-CAPILLARY: 130 mg/dL — AB (ref 65–99)
GLUCOSE-CAPILLARY: 130 mg/dL — AB (ref 65–99)
GLUCOSE-CAPILLARY: 153 mg/dL — AB (ref 65–99)
GLUCOSE-CAPILLARY: 170 mg/dL — AB (ref 65–99)
GLUCOSE-CAPILLARY: 160 mg/dL — AB (ref 65–99)
GLUCOSE-CAPILLARY: 143 mg/dL — AB (ref 65–99)
GLUCOSE-CAPILLARY: 149 mg/dL — AB (ref 65–99)
GLUCOSE-CAPILLARY: 161 mg/dL — AB (ref 65–99)
Glucose-Capillary: 124 mg/dL — ABNORMAL HIGH (ref 65–99)
Glucose-Capillary: 129 mg/dL — ABNORMAL HIGH (ref 65–99)
Glucose-Capillary: 129 mg/dL — ABNORMAL HIGH (ref 65–99)
Glucose-Capillary: 145 mg/dL — ABNORMAL HIGH (ref 65–99)
Glucose-Capillary: 145 mg/dL — ABNORMAL HIGH (ref 65–99)
Glucose-Capillary: 145 mg/dL — ABNORMAL HIGH (ref 65–99)
Glucose-Capillary: 145 mg/dL — ABNORMAL HIGH (ref 65–99)
Glucose-Capillary: 146 mg/dL — ABNORMAL HIGH (ref 65–99)
Glucose-Capillary: 158 mg/dL — ABNORMAL HIGH (ref 65–99)

## 2015-08-03 LAB — CBC
HEMATOCRIT: 36.4 % — AB (ref 40.0–52.0)
HEMOGLOBIN: 13.3 g/dL (ref 13.0–18.0)
MCH: UNDETERMINED pg (ref 26.0–34.0)
MCHC: UNDETERMINED g/dL (ref 32.0–36.0)
MCV: 81 fL (ref 80.0–100.0)
Platelets: 211 10*3/uL (ref 150–440)
RBC: 4.49 MIL/uL (ref 4.40–5.90)
RDW: 15.4 % — ABNORMAL HIGH (ref 11.5–14.5)
WBC: 5.9 10*3/uL (ref 3.8–10.6)

## 2015-08-03 LAB — BASIC METABOLIC PANEL
ANION GAP: 7 (ref 5–15)
BUN: 7 mg/dL (ref 6–20)
CHLORIDE: 90 mmol/L — AB (ref 101–111)
CO2: 26 mmol/L (ref 22–32)
Calcium: 8.3 mg/dL — ABNORMAL LOW (ref 8.9–10.3)
Creatinine, Ser: 0.73 mg/dL (ref 0.61–1.24)
GFR calc non Af Amer: >60 mL/min (ref >60)
Glucose, Bld: 121 mg/dL — ABNORMAL HIGH (ref 65–99)
Potassium: 3.1 mmol/L — ABNORMAL LOW (ref 3.5–5.1)
Sodium: 123 mmol/L — ABNORMAL LOW (ref 135–145)

## 2015-08-03 LAB — TRIGLYCERIDES: Triglycerides: UNDETERMINED mg/dL (ref <150)

## 2015-08-03 MED ORDER — SODIUM CHLORIDE 0.9 % IV SOLN
INTRAVENOUS | Status: DC
  Administered 2015-08-03 – 2015-08-04 (×2): 10.8 [IU]/h via INTRAVENOUS
  Administered 2015-08-05: 23:00:00 via INTRAVENOUS
  Administered 2015-08-06 – 2015-08-07 (×2): 10.8 [IU]/h via INTRAVENOUS
  Administered 2015-08-08 – 2015-08-09 (×2): via INTRAVENOUS
  Administered 2015-08-10 – 2015-08-12 (×3): 10.8 [IU]/h via INTRAVENOUS
  Filled 2015-08-03 (×12): qty 2.5

## 2015-08-03 MED ORDER — POTASSIUM CHLORIDE 20 MEQ PO PACK
40.0000 meq | PACK | Freq: Once | ORAL | Status: AC
  Administered 2015-08-03: 40 meq via ORAL
  Filled 2015-08-03: qty 2

## 2015-08-03 NOTE — Progress Notes (Signed)
[place] at [place] NAME: Sergio [name]    MR#:  [id number]  DATE OF BIRTH:  02-10-1976  SUBJECTIVE:  CHIEF COMPLAINT:   Chief Complaint  Patient presents with  . Abdominal Pain   Patient here due to epigastric abdominal pain and noted to have severe hypertriglyceridemia. Abdominal pain has improved.  No N/V.   REVIEW OF SYSTEMS:    Review of Systems  Constitutional: Negative for fever and chills.  HENT: Negative for congestion and tinnitus.   Eyes: Negative for blurred vision and double vision.  Respiratory: Negative for cough, shortness of breath and wheezing.   Cardiovascular: Negative for chest pain, orthopnea and PND.  Gastrointestinal: Positive for nausea and abdominal pain. Negative for vomiting and diarrhea.  Genitourinary: Negative for dysuria and hematuria.  Neurological: Negative for dizziness, sensory change and focal weakness.  All other systems reviewed and are negative.   Nutrition: Clear liquid Tolerating Diet: Yes Tolerating PT:  Await evaluation    DRUG ALLERGIES:   Allergies  Allergen Reactions  . Codeine Anaphylaxis  . Ivp Dye [Iodinated Diagnostic Agents] Other (See Comments)    Kidneys stop working    VITALS:  Blood pressure 128/85, pulse 75, temperature 99.6 F (37.6 C), temperature source Oral, resp. rate 10, height 6' (1.829 m), weight 107.1 kg (236 lb 1.8 oz), SpO2 97 %.  PHYSICAL EXAMINATION:   Physical Exam  GENERAL:  39 y.o.-year-old obese patient lying in the bed in no acute distress.  EYES: Pupils equal, round, reactive to light and accommodation. No scleral icterus. Extraocular muscles intact.  HEENT: Head atraumatic, normocephalic. Oropharynx and nasopharynx clear.  NECK:  Supple, no jugular venous distention. No thyroid enlargement, no tenderness.  LUNGS: Normal breath sounds bilaterally, no wheezing, rales, rhonchi. No use of accessory muscles of respiration.  CARDIOVASCULAR:  S1, S2 normal. No murmurs, rubs, or gallops.  ABDOMEN: Soft, nondistended, tender in the epigastric area but no rebound, rigidity. Bowel sounds present. No organomegaly or mass.  EXTREMITIES: No cyanosis, clubbing or edema b/l.    NEUROLOGIC: Cranial nerves II through XII are intact. No focal Motor or sensory deficits b/l.   PSYCHIATRIC: The patient is alert and oriented x 3. Good affect SKIN: No obvious rash, lesion, or ulcer.    LABORATORY PANEL:   CBC  Recent Labs Lab 08/03/15 0515  WBC 5.9  HGB 13.3  HCT 36.4*  PLT 211   ------------------------------------------------------------------------------------------------------------------  Chemistries   Recent Labs Lab 08/01/15 2035  08/03/15 0515  NA 114*  < > 123*  K 3.2*  < > 3.1*  CL 83*  < > 90*  CO2 21*  < > 26  GLUCOSE 183*  < > 121*  BUN UNABLE TO REPORT DUE TO LIPEMIC INTERFERENCE  < > 7  CREATININE UNABLE TO REPORT DUE TO LIPEMIC INTERFERENCE  < > 0.73  CALCIUM 8.4*  < > 8.3*  AST UNABLE TO REPORT DUE TO LIPEMIC INTERFERENCE  --   --   ALT UNABLE TO REPORT DUE TO LIPEMIC INTERFERENCE  --   --   ALKPHOS UNABLE TO REPORT DUE TO LIPEMIC INTERFERENCE  --   --   BILITOT <0.1*  --   --   < > = values in this interval not displayed. ------------------------------------------------------------------------------------------------------------------  Cardiac Enzymes No results for input(s): TROPONINI in the last 168 hours. ------------------------------------------------------------------------------------------------------------------  RADIOLOGY:  No results found.   ASSESSMENT AND PLAN:   39 year old male with past medical history of  hypertriglyceridemia, acute pancreatitis, diabetes, morbid obesity, obstructive sleep apnea, who presents to the hospital abdominal pain and noted to have significant hypertriglyceridemia.  #1 acute pancreatitis with hypertriglyceridemia-continue supportive care with IV fluids,  antiemetics, pain control and it's improving.    #2 severe hypertriglyceridemia-continue supportive care. Continue insulin drip, dextrose. -Follow and continue insulin drip until triglyceride levels drop below 500. Will repeat Triglyceride level today.   -Await endocrinology consult  #3 hyponatremia-likely pseudohyponatremia from the paraproteinemia and hypertriglyceridemia. - improving and will monitor.   #4 anxiety-continue Xanax.  #5 depression-continue Zoloft.  #6 Hypokalemia - will place on oral potassium supplements and monitor level.     All the records are reviewed and case discussed with Care Management/Social Workerr. Management plans discussed with the patient, family and they are in agreement.  CODE STATUS: Full  DVT Prophylaxis: Heparin subcutaneous  TOTAL TIME TAKING CARE OF THIS PATIENT: 25 minutes.   POSSIBLE D/C IN 2-3 DAYS, DEPENDING ON CLINICAL CONDITION.   Henreitta Leber M.D on 08/03/2015 at 11:45 AM  Between 7am to 6pm - Pager - [phone]  After 6pm go to [email or web address] - password [email or web address] [facility]  Office  [phone]  CC: Primary care physician; Crecencio Mc, MD

## 2015-08-04 LAB — BASIC METABOLIC PANEL
ANION GAP: 6 (ref 5–15)
BUN: <5 mg/dL — ABNORMAL LOW (ref 6–20)
CALCIUM: 8.4 mg/dL — AB (ref 8.9–10.3)
CO2: 29 mmol/L (ref 22–32)
CREATININE: 0.55 mg/dL — AB (ref 0.61–1.24)
Chloride: 91 mmol/L — ABNORMAL LOW (ref 101–111)
Glucose, Bld: 169 mg/dL — ABNORMAL HIGH (ref 65–99)
Potassium: 3.8 mmol/L (ref 3.5–5.1)
SODIUM: 126 mmol/L — AB (ref 135–145)

## 2015-08-04 LAB — GLUCOSE, CAPILLARY
GLUCOSE-CAPILLARY: 145 mg/dL — AB (ref 65–99)
GLUCOSE-CAPILLARY: 146 mg/dL — AB (ref 65–99)
GLUCOSE-CAPILLARY: 146 mg/dL — AB (ref 65–99)
GLUCOSE-CAPILLARY: 147 mg/dL — AB (ref 65–99)
GLUCOSE-CAPILLARY: 159 mg/dL — AB (ref 65–99)
GLUCOSE-CAPILLARY: 162 mg/dL — AB (ref 65–99)
GLUCOSE-CAPILLARY: 163 mg/dL — AB (ref 65–99)
GLUCOSE-CAPILLARY: 168 mg/dL — AB (ref 65–99)
GLUCOSE-CAPILLARY: 170 mg/dL — AB (ref 65–99)
Glucose-Capillary: 145 mg/dL — ABNORMAL HIGH (ref 65–99)
Glucose-Capillary: 146 mg/dL — ABNORMAL HIGH (ref 65–99)
Glucose-Capillary: 154 mg/dL — ABNORMAL HIGH (ref 65–99)
Glucose-Capillary: 155 mg/dL — ABNORMAL HIGH (ref 65–99)
Glucose-Capillary: 169 mg/dL — ABNORMAL HIGH (ref 65–99)
Glucose-Capillary: 180 mg/dL — ABNORMAL HIGH (ref 65–99)
Glucose-Capillary: 175 mg/dL — ABNORMAL HIGH (ref 65–99)

## 2015-08-04 LAB — HEMOGLOBIN A1C: HEMOGLOBIN A1C: 11.7 % — AB (ref 4.0–6.0)

## 2015-08-04 LAB — MAGNESIUM: MAGNESIUM: 3.5 mg/dL — AB (ref 1.7–2.4)

## 2015-08-04 LAB — MRSA PCR SCREENING: MRSA by PCR: NEGATIVE

## 2015-08-04 NOTE — Progress Notes (Signed)
Pts VS WNL, on RA with no reports of SOB or dyspnea, CPAP during sleep. Pt A&O, afebrile and NSR, NPO at this time. Pt requiring Q3hr IV dilaudid to manage pain. Pt reporting minimal relief and states that his pain only decreases some but never goes away. Family at bedside, will continue to monitor.

## 2015-08-04 NOTE — Progress Notes (Signed)
Stable on insulin drip, non titratable. Accu-Cheks this at to every 3 hours reasonable to transfer out of ICU

## 2015-08-04 NOTE — Consult Note (Signed)
ENDOCRINOLOGY CONSULTATION  REFERRING PHYSICIAN: Valentino Nose, MD CONSULTING PHYSICIAN:  A. Lavone Orn, MD.  CHIEF COMPLAINT:  Diabetic mellitus  HISTORY OF PRESENT ILLNESS:  39 y.o. male seen in consultation for acute pancreatitis due to hypertriglyceridemia. Sergio [name] is well known to me and has a h/o multiple prior admissions for this diagnosis. He reports acute onset abdominal pain 3 nights ago. Pain lasted a few hours prior to presenting to the ER. He also had N/V/poor appetite. Pain was similar to prior hospitalizations. He was hemodynamically stable. He has not had imaging. He was started on IV fluids. Now on IV insulin (rate 10.5 units/hr) and IV D5 (rate 150 cc/hr) with sguars in the 140 - 180 range today. He has a liquid diet ordered but he has no appetite and does not want to eat. Denies nausea. Reports pain in abdomen is controlled with Dilaudid.  He has a h/o familial hypertriglyceridemia. He stopped his fibrate and fish oil after bariatric surgery in 01/2015. He has not had lipids repeated since surgery. Last hospitalization for acute pancreatitis due to hypertriglyceridemia was in 10/2014.   Also with h/o type 2 diabetes. Also stopped diabetes medications (insulin pump therapy) after bariatric surgery. He has not been checking fingerstick blood sugars. No recent Hgb A1c.   He was seen in clinic by me in 01/2015 and 04/2015. Labs were ordered on both occasions, but he elected not to have them drawn. Admits to wanting to be in denial about needing medications again. This has been a chronic problem and was being addressed in therapy with his psychiatrist (Dr Derenda Mis in [place] in the past. He last had psychiatry follow up about 1 month ago.    PAST MEDICAL HISTORY:  Past Medical History  Diagnosis Date  . Chronic pancreatitis [facility]) May 2009    secondary to hypertirglyceridemia   . Gastric ulcer 2009    by endo  . Sleep apnea 1999    uses CPAP, auto titrates,  [facility])  . Chicken pox    . Diabetes mellitus   . HTN (hypertension)   . Familial hypertriglyceridemia      CURRENT MEDICATIONS:  . aspirin EC  81 mg Oral Daily  . heparin  5,000 Units Subcutaneous 3 times per day  . pantoprazole  40 mg Oral Daily  . sertraline  50 mg Oral Daily  IV dextrose IV insulin   SOCIAL HISTORY:  Married. Employed.  Social History  Substance Use Topics  . Smoking status: Never Smoker   . Smokeless tobacco: Never Used  . Alcohol Use: No     Comment: once month     FAMILY HISTORY:   Family History  Problem Relation Age of Onset  . Adopted: Yes    ALLERGIES:  Allergies  Allergen Reactions  . Codeine Anaphylaxis  . Ivp Dye [Iodinated Diagnostic Agents] Other (See Comments)    Kidneys stop working    REVIEW OF SYSTEMS:  GENERAL: He has had a 20 lb weight gain in the last 3 months. No fever.  HEENT:  No blurred vision. No sore throat.  NECK:  No neck pain or dysphagia.  CARDIAC:  No chest pain or palpitation.  PULMONARY:  No cough or shortness of breath.  ABDOMEN:  "Slight" abdominal pain - controlled at rest. No constipation. EXTREMITIES:  No lower extremity swelling.  ENDOCRINE:  No heat or cold intolerance.  GENITOURINARY:  No dysuria or hematuria. SKIN:  No recent rash or skin changes.   PHYSICAL EXAMINATION:  BP  111/72 mmHg  Pulse 72  Temp(Src) 98.5 F (36.9 C) (Oral)  Resp 8  Ht 6' (1.829 m)  Wt 107.1 kg (236 lb 1.8 oz)  BMI 32.02 kg/m2  SpO2 97%  GENERAL: Overweight white  male in NAD. HEENT:  EOMI.  Oropharynx is clear.  NECK:  Supple.  No thyromegaly.  No neck tenderness.  CARDIAC:  Regular rate and rhythm without murmur.  PULMONARY:  Clear to auscultation bilaterally.  ABDOMEN:  Diffusely soft, nontender, nondistended.  EXTREMITIES:  No peripheral edema is present.    SKIN:  No rash or dermatopathy. Isolated few xanthomas on the flelxor surfaces of the upper ext b/l NEUROLOGIC:  No dysarthria.  No tremor. PSYCHIATRIC:  Alert and oriented,  calm, cooperative.   LABORATORY DATA:  Results for orders placed or performed during the hospital encounter of 08/01/15 (from the past 24 hour(s))  Glucose, capillary     Status: Abnormal   Collection Time: 08/03/15  6:04 PM  Result Value Ref Range   Glucose-Capillary 170 (H) 65 - 99 mg/dL  Glucose, capillary     Status: Abnormal   Collection Time: 08/03/15  7:57 PM  Result Value Ref Range   Glucose-Capillary 160 (H) 65 - 99 mg/dL  Glucose, capillary     Status: Abnormal   Collection Time: 08/03/15  9:03 PM  Result Value Ref Range   Glucose-Capillary 143 (H) 65 - 99 mg/dL  Glucose, capillary     Status: Abnormal   Collection Time: 08/03/15  9:57 PM  Result Value Ref Range   Glucose-Capillary 149 (H) 65 - 99 mg/dL  Glucose, capillary     Status: Abnormal   Collection Time: 08/03/15 10:56 PM  Result Value Ref Range   Glucose-Capillary 161 (H) 65 - 99 mg/dL  Glucose, capillary     Status: Abnormal   Collection Time: 08/03/15 11:53 PM  Result Value Ref Range   Glucose-Capillary 158 (H) 65 - 99 mg/dL  Glucose, capillary     Status: Abnormal   Collection Time: 08/04/15 12:50 AM  Result Value Ref Range   Glucose-Capillary 170 (H) 65 - 99 mg/dL  Glucose, capillary     Status: Abnormal   Collection Time: 08/04/15  1:59 AM  Result Value Ref Range   Glucose-Capillary 145 (H) 65 - 99 mg/dL  Glucose, capillary     Status: Abnormal   Collection Time: 08/04/15  1:59 AM  Result Value Ref Range   Glucose-Capillary 145 (H) 65 - 99 mg/dL  Glucose, capillary     Status: Abnormal   Collection Time: 08/04/15  2:56 AM  Result Value Ref Range   Glucose-Capillary 146 (H) 65 - 99 mg/dL  Glucose, capillary     Status: Abnormal   Collection Time: 08/04/15  3:58 AM  Result Value Ref Range   Glucose-Capillary 146 (H) 65 - 99 mg/dL  Glucose, capillary     Status: Abnormal   Collection Time: 08/04/15  4:54 AM  Result Value Ref Range   Glucose-Capillary 154 (H) 65 - 99 mg/dL  Basic metabolic panel      Status: Abnormal   Collection Time: 08/04/15  5:48 AM  Result Value Ref Range   Sodium 126 (L) 135 - 145 mmol/L   Potassium 3.8 3.5 - 5.1 mmol/L   Chloride 91 (L) 101 - 111 mmol/L   CO2 29 22 - 32 mmol/L   Glucose, Bld 169 (H) 65 - 99 mg/dL   BUN <5 (L) 6 - 20 mg/dL   Creatinine, Ser  0.55 (L) 0.61 - 1.24 mg/dL   Calcium 8.4 (L) 8.9 - 10.3 mg/dL   GFR calc non Af Amer >60 >60 mL/min   GFR calc Af Amer >60 >60 mL/min   Anion gap 6 5 - 15  Hemoglobin A1c     Status: Abnormal   Collection Time: 08/04/15  5:48 AM  Result Value Ref Range   Hgb A1c MFr Bld 11.7 (H) 4.0 - 6.0 %  Magnesium     Status: Abnormal   Collection Time: 08/04/15  5:48 AM  Result Value Ref Range   Magnesium 3.5 (H) 1.7 - 2.4 mg/dL  Glucose, capillary     Status: Abnormal   Collection Time: 08/04/15  5:56 AM  Result Value Ref Range   Glucose-Capillary 155 (H) 65 - 99 mg/dL  Glucose, capillary     Status: Abnormal   Collection Time: 08/04/15  6:59 AM  Result Value Ref Range   Glucose-Capillary 169 (H) 65 - 99 mg/dL  Glucose, capillary     Status: Abnormal   Collection Time: 08/04/15  8:19 AM  Result Value Ref Range   Glucose-Capillary 180 (H) 65 - 99 mg/dL  Glucose, capillary     Status: Abnormal   Collection Time: 08/04/15  9:06 AM  Result Value Ref Range   Glucose-Capillary 175 (H) 65 - 99 mg/dL  Glucose, capillary     Status: Abnormal   Collection Time: 08/04/15  9:54 AM  Result Value Ref Range   Glucose-Capillary 168 (H) 65 - 99 mg/dL  Glucose, capillary     Status: Abnormal   Collection Time: 08/04/15 12:07 PM  Result Value Ref Range   Glucose-Capillary 162 (H) 65 - 99 mg/dL  Glucose, capillary     Status: Abnormal   Collection Time: 08/04/15  1:08 PM  Result Value Ref Range   Glucose-Capillary 159 (H) 65 - 99 mg/dL  Glucose, capillary     Status: Abnormal   Collection Time: 08/04/15  2:19 PM  Result Value Ref Range   Glucose-Capillary 163 (H) 65 - 99 mg/dL  Glucose, capillary     Status:  Abnormal   Collection Time: 08/04/15  4:47 PM  Result Value Ref Range   Glucose-Capillary 147 (H) 65 - 99 mg/dL    ASSESSMENT:  1. Acute pancreatitis due to hypertriglyceridemia. 2.   Familial hypertriglyceridemia 3.   Hyperglycemia 4.   History of bariatric surgery 5.   Obesity (BMI 32)  PLAN: 1. I recommend continued use of IV insulin and IV dextrose. Aim to keep sugars in the 100-180 range.  2. Keep NPO 3. Trend triglycerides. He likely will need IV insulin until serum Tg level <1000. Could request lab perform serial dilution to calculate accurate Tg level if it remains too high to be detected on standard lab. 4. Will obtain a repeat Hgb A1c.  5. Counseled patient about life-long need for medical therapy for dyslipidemia. 6. Will consider restarting fibrate and fish oil in next 1-3 days, depending on clinical condition. 7. No need for imaging at this time as pain is controlled, he is stable, and clinical condition is improving.  I will follow along with you.

## 2015-08-04 NOTE — Progress Notes (Signed)
[place] at [place] NAME: Sergio [name]    MR#:  [id number]  DATE OF BIRTH:  [birth year]  SUBJECTIVE:  CHIEF COMPLAINT:   Chief Complaint  Patient presents with  . Abdominal Pain   Still having significant abdominal pain. No nausea/vomiting. Triglycerides is still undetectable.  REVIEW OF SYSTEMS:    Review of Systems  Constitutional: Negative for fever and chills.  HENT: Negative for congestion and tinnitus.   Eyes: Negative for blurred vision and double vision.  Respiratory: Negative for cough, shortness of breath and wheezing.   Cardiovascular: Negative for chest pain, orthopnea and PND.  Gastrointestinal: Positive for nausea and abdominal pain. Negative for vomiting and diarrhea.  Genitourinary: Negative for dysuria and hematuria.  Neurological: Negative for dizziness, sensory change and focal weakness.  All other systems reviewed and are negative.   Nutrition: NPO Tolerating Diet: No Tolerating PT:  Await evaluation    DRUG ALLERGIES:   Allergies  Allergen Reactions  . Codeine Anaphylaxis  . Ivp Dye [Iodinated Diagnostic Agents] Other (See Comments)    Kidneys stop working    VITALS:  Blood pressure 125/87, pulse 78, temperature 98.5 F (36.9 C), temperature source Oral, resp. rate 15, height 6' (1.829 m), weight 107.1 kg (236 lb 1.8 oz), SpO2 99 %.  PHYSICAL EXAMINATION:   Physical Exam  GENERAL:  39 y.o.-year-old obese patient lying in the bed in no acute distress.  EYES: Pupils equal, round, reactive to light and accommodation. No scleral icterus. Extraocular muscles intact.  HEENT: Head atraumatic, normocephalic. Oropharynx and nasopharynx clear.  NECK:  Supple, no jugular venous distention. No thyroid enlargement, no tenderness.  LUNGS: Normal breath sounds bilaterally, no wheezing, rales, rhonchi. No use of accessory muscles of respiration.  CARDIOVASCULAR: S1, S2 normal. No murmurs, rubs, or gallops.   ABDOMEN: Soft, nondistended, tender in the epigastric area but no rebound, rigidity. Bowel sounds present. No organomegaly or mass.  EXTREMITIES: No cyanosis, clubbing or edema b/l.    NEUROLOGIC: Cranial nerves II through XII are intact. No focal Motor or sensory deficits b/l.   PSYCHIATRIC: The patient is alert and oriented x 3. Good affect SKIN: No obvious rash, lesion, or ulcer.    LABORATORY PANEL:   CBC  Recent Labs Lab 08/03/15 0515  WBC 5.9  HGB 13.3  HCT 36.4*  PLT 211   ------------------------------------------------------------------------------------------------------------------  Chemistries   Recent Labs Lab 08/01/15 2035  08/04/15 0548  NA 114*  < > 126*  K 3.2*  < > 3.8  CL 83*  < > 91*  CO2 21*  < > 29  GLUCOSE 183*  < > 169*  BUN UNABLE TO REPORT DUE TO LIPEMIC INTERFERENCE  < > <5*  CREATININE UNABLE TO REPORT DUE TO LIPEMIC INTERFERENCE  < > 0.55*  CALCIUM 8.4*  < > 8.4*  AST UNABLE TO REPORT DUE TO LIPEMIC INTERFERENCE  --   --   ALT UNABLE TO REPORT DUE TO LIPEMIC INTERFERENCE  --   --   ALKPHOS UNABLE TO REPORT DUE TO LIPEMIC INTERFERENCE  --   --   BILITOT <0.1*  --   --   < > = values in this interval not displayed. ------------------------------------------------------------------------------------------------------------------  Cardiac Enzymes No results for input(s): TROPONINI in the last 168 hours. ------------------------------------------------------------------------------------------------------------------  RADIOLOGY:  No results found.   ASSESSMENT AND PLAN:   39 year old male with past medical history of hypertriglyceridemia, acute pancreatitis, diabetes, morbid obesity, obstructive sleep apnea, who presents  to the hospital abdominal pain and noted to have significant hypertriglyceridemia.  #1 acute pancreatitis with hypertriglyceridemia-slow to improved.  - continue supportive care with IV fluids, antiemetics, pain control  and cont. To monitor.     #2 severe hypertriglyceridemia-continue supportive care. Continue insulin drip, dextrose. -Follow and continue insulin drip until triglyceride levels drop below 1000. Will repeat Triglyceride level in a.m. Tomorrow.  - discussed w/ Endocrinology (Dr. Gabriel Carina) and cont. Current supportive care.  Will place a scheduled dose of insulin gtt along with dextrose so it doesn't need to be titrated and therefore pt. Can go to the floor.   #3 hyponatremia-likely pseudohyponatremia from the paraproteinemia and hypertriglyceridemia. - improving and will monitor.   #4 anxiety-continue Xanax.  #5 depression-continue Zoloft.  #6 Hypokalemia - improved with supplementation.  - will check Mg. Level.   All the records are reviewed and case discussed with Care Management/Social Workerr. Management plans discussed with the patient, family and they are in agreement.  CODE STATUS: Full  DVT Prophylaxis: Heparin subcutaneous  TOTAL TIME TAKING CARE OF THIS PATIENT: 25 minutes.   POSSIBLE D/C IN 2-3 DAYS, DEPENDING ON CLINICAL CONDITION.   Henreitta Leber M.D on 08/04/2015 at 2:32 PM  Between 7am to 6pm - Pager - [phone]  After 6pm go to [email or web address] - password [email or web address] [facility]  Office  [phone]  CC: Primary care physician; Crecencio Mc, MD

## 2015-08-04 NOTE — Progress Notes (Signed)
Patient awoke half the night. Periods of nausea and pain accompanied by PRN meds to help subside. Patient dangled from the bed at times and got up without assistance and moved around room. Patient was advised to leave his blood pressure cuff and pulse oximetry on several times. While in bed (asleep and awoke), patient had on CPAP machine.

## 2015-08-05 ENCOUNTER — Encounter: Payer: Self-pay | Admitting: Internal Medicine

## 2015-08-05 LAB — BASIC METABOLIC PANEL
ANION GAP: 4 — AB (ref 5–15)
CHLORIDE: 91 mmol/L — AB (ref 101–111)
CO2: 32 mmol/L (ref 22–32)
Calcium: 8.5 mg/dL — ABNORMAL LOW (ref 8.9–10.3)
Creatinine, Ser: 1.27 mg/dL — ABNORMAL HIGH (ref 0.61–1.24)
Glucose, Bld: 101 mg/dL — ABNORMAL HIGH (ref 65–99)
POTASSIUM: 6.8 mmol/L — AB (ref 3.5–5.1)
SODIUM: 127 mmol/L — AB (ref 135–145)

## 2015-08-05 LAB — GLUCOSE, CAPILLARY
GLUCOSE-CAPILLARY: 101 mg/dL — AB (ref 65–99)
GLUCOSE-CAPILLARY: 116 mg/dL — AB (ref 65–99)
GLUCOSE-CAPILLARY: 141 mg/dL — AB (ref 65–99)
GLUCOSE-CAPILLARY: 152 mg/dL — AB (ref 65–99)
Glucose-Capillary: 137 mg/dL — ABNORMAL HIGH (ref 65–99)
Glucose-Capillary: 112 mg/dL — ABNORMAL HIGH (ref 65–99)
Glucose-Capillary: 117 mg/dL — ABNORMAL HIGH (ref 65–99)
Glucose-Capillary: 131 mg/dL — ABNORMAL HIGH (ref 65–99)

## 2015-08-05 LAB — MISC LABCORP TEST (SEND OUT): LABCORP TEST CODE: 1172

## 2015-08-05 LAB — TRIGLYCERIDES

## 2015-08-05 LAB — POTASSIUM: POTASSIUM: 5.7 mmol/L — AB (ref 3.5–5.1)

## 2015-08-05 NOTE — Progress Notes (Signed)
[place] at [place] NAME: Sergio [name]    MR#:  [id number]  DATE OF BIRTH:  [birth year]  SUBJECTIVE:  CHIEF COMPLAINT:   Chief Complaint  Patient presents with  . Abdominal Pain   Still having some abdominal pain but improved.  No N/V.  Trig level > 5000.    REVIEW OF SYSTEMS:    Review of Systems  Constitutional: Negative for fever and chills.  HENT: Negative for congestion and tinnitus.   Eyes: Negative for blurred vision and double vision.  Respiratory: Negative for cough, shortness of breath and wheezing.   Cardiovascular: Negative for chest pain, orthopnea and PND.  Gastrointestinal: Positive for nausea and abdominal pain. Negative for vomiting and diarrhea.  Genitourinary: Negative for dysuria and hematuria.  Neurological: Negative for dizziness, sensory change and focal weakness.  All other systems reviewed and are negative.   Nutrition: NPO Tolerating Diet: No Tolerating PT: Ambulatory   DRUG ALLERGIES:   Allergies  Allergen Reactions  . Codeine Anaphylaxis  . Ivp Dye [Iodinated Diagnostic Agents] Other (See Comments)    Kidneys stop working    VITALS:  Blood pressure 116/66, pulse 70, temperature 96.6 F (35.9 C), temperature source Axillary, resp. rate 19, height 6' (1.829 m), weight 107.1 kg (236 lb 1.8 oz), SpO2 95 %.  PHYSICAL EXAMINATION:   Physical Exam  GENERAL:  39 y.o.-year-old obese patient lying in the bed in no acute distress.  EYES: Pupils equal, round, reactive to light and accommodation. No scleral icterus. Extraocular muscles intact.  HEENT: Head atraumatic, normocephalic. Oropharynx and nasopharynx clear.  NECK:  Supple, no jugular venous distention. No thyroid enlargement, no tenderness.  LUNGS: Normal breath sounds bilaterally, no wheezing, rales, rhonchi. No use of accessory muscles of respiration.  CARDIOVASCULAR: S1, S2 normal. No murmurs, rubs, or gallops.  ABDOMEN: Soft,  nondistended, tender in the epigastric area but no rebound, rigidity. Bowel sounds present. No organomegaly or mass.  EXTREMITIES: No cyanosis, clubbing or edema b/l.    NEUROLOGIC: Cranial nerves II through XII are intact. No focal Motor or sensory deficits b/l.   PSYCHIATRIC: The patient is alert and oriented x 3. Good affect SKIN: No obvious rash, lesion, or ulcer.    LABORATORY PANEL:   CBC  Recent Labs Lab 08/03/15 0515  WBC 5.9  HGB 13.3  HCT 36.4*  PLT 211   ------------------------------------------------------------------------------------------------------------------  Chemistries   Recent Labs Lab 08/01/15 2035  08/04/15 0548 08/05/15 1126  NA 114*  < > 126* 127*  K 3.2*  < > 3.8 6.8*  CL 83*  < > 91* 91*  CO2 21*  < > 29 32  GLUCOSE 183*  < > 169* 101*  BUN UNABLE TO REPORT DUE TO LIPEMIC INTERFERENCE  < > <5* <5*  CREATININE UNABLE TO REPORT DUE TO LIPEMIC INTERFERENCE  < > 0.55* 1.27*  CALCIUM 8.4*  < > 8.4* 8.5*  MG  --   --  3.5*  --   AST UNABLE TO REPORT DUE TO LIPEMIC INTERFERENCE  --   --   --   ALT UNABLE TO REPORT DUE TO LIPEMIC INTERFERENCE  --   --   --   ALKPHOS UNABLE TO REPORT DUE TO LIPEMIC INTERFERENCE  --   --   --   BILITOT <0.1*  --   --   --   < > = values in this interval not displayed. ------------------------------------------------------------------------------------------------------------------  Cardiac Enzymes No results for input(s): TROPONINI  in the last 168 hours. ------------------------------------------------------------------------------------------------------------------  RADIOLOGY:  No results found.   ASSESSMENT AND PLAN:   39 year old male with past medical history of hypertriglyceridemia, acute pancreatitis, diabetes, morbid obesity, obstructive sleep apnea, who presents to the hospital abdominal pain and noted to have significant hypertriglyceridemia.  #1 acute pancreatitis with hypertriglyceridemia-slow to  improve.  - continue supportive care with IV fluids, antiemetics, pain control. - Trig level after dilution today > 5000.    #2 severe hypertriglyceridemia-continue supportive care. Continue insulin drip, dextrose. -Follow and continue insulin drip until triglyceride levels drop below 1000.  - Trig level today > 5000.  - discussed w/ Endocrinology (Dr. Gabriel Carina) and cont. Current supportive care.   #3 hyponatremia-likely pseudohyponatremia from the paraproteinemia and hypertriglyceridemia. - stable and will monitor.   #4 anxiety-continue Xanax.  #5 depression-continue Zoloft.  #6 Hyperkalemia - ?? Lab error/hemolysis. Will repeat value.  Check ECG.  - will monitor.    All the records are reviewed and case discussed with Care Management/Social Workerr. Management plans discussed with the patient, family and they are in agreement.  CODE STATUS: Full  DVT Prophylaxis: Heparin subcutaneous  TOTAL TIME TAKING CARE OF THIS PATIENT: 25 minutes.   POSSIBLE D/C IN 2-3 DAYS, DEPENDING ON CLINICAL CONDITION.   Henreitta Leber M.D on 08/05/2015 at 12:59 PM  Between 7am to 6pm - Pager - [phone]  After 6pm go to [email or web address] - password [email or web address] [facility]  Office  [phone]  CC: Primary care physician; Crecencio Mc, MD

## 2015-08-05 NOTE — Progress Notes (Signed)
Paged MD with critical lab for potassium of 6.8.

## 2015-08-05 NOTE — Progress Notes (Signed)
Sergio [name] is a 39 y.o. male seen in follow up for hypertriglyceridemia induced acute pancreatitis. He has been transferred to the floor. Still maintained on IV insulin at 10.8 units/hr and IV D5 at 150 cc/hr. Sugars have been in the 110-130 range. He continues to report abd pain, now 6/10. With Dilaudid, pain may be reduced at times to 3/10. He continues to lack an appetite. No N/V.   Medical history Past Medical History  Diagnosis Date  . Recurrent acute pancreatitis     secondary to hypertriglyceridemia   . Gastric ulcer 2009  . Sleep apnea 1999    uses CPAP  . Chicken pox   . Diabetes mellitus   . HTN (hypertension)   . Familial hypertriglyceridemia     severe  . Morbid obesity [facility])     s/p bariatric sleeve surgery 01/2015    Surgical history Past Surgical History  Procedure Laterality Date  . Tonsillectomy  2003  . Vasectomy    . Lumbar disc surgery  2008  . Cholecystectomy    . Laparoscopic gastric sleeve resection       Medications . aspirin EC  81 mg Oral Daily  . heparin  5,000 Units Subcutaneous 3 times per day  . pantoprazole  40 mg Oral Daily  . sertraline  50 mg Oral Daily  IV insulin IV dextrose   Social history  Married. Employed. Social History  Substance Use Topics  . Smoking status: Never Smoker   . Smokeless tobacco: Never Used  . Alcohol Use: No     Comment: once month     Family history Family History  Problem Relation Age of Onset  . Adopted: Yes  . Hypertension Mother   . Hyperlipidemia Mother   . Hypertension Father   . Hyperlipidemia Father      Review of systems CV: no chest pain or palpitations PULM: no cough or shortness of breath GEN: No fevers or chills  Physical Exam BP 126/81 mmHg  Pulse 73  Temp(Src) 97.9 F (36.6 C) (Axillary)  Resp 18  Ht 6' (1.829 m)  Wt 107.1 kg (236 lb 1.8 oz)  BMI 32.02 kg/m2  SpO2 96%  GEN: Obese white  male, in NAD. HEENT: No proptosis, EOMI, lid lag or stare. Oropharynx is  clear.  NECK: supple, trachea midline.   RESPIRATORY: clear bilaterally, no wheeze, good inspiratory effort. CV: No carotid bruits, RRR. MUSCULOSKELETAL: normal gait and station, no clubbing, no tremor ABD: soft, +diffuse non focal TTP, no guarding EXT: no peripheral edema SKIN: no dermatopathy or rash or acanthosis nigricans.  LYMPH: no submandibular or supraclavicular LAD NEURO: PERRL, 2+ knee DTRs equal and symmetric.  PSYC: alert and oriented, good insight  Labs Results for orders placed or performed during the hospital encounter of 08/01/15 (from the past 24 hour(s))  Glucose, capillary     Status: Abnormal   Collection Time: 08/04/15  9:15 PM  Result Value Ref Range   Glucose-Capillary 146 (H) 65 - 99 mg/dL  Glucose, capillary     Status: Abnormal   Collection Time: 08/04/15 11:58 PM  Result Value Ref Range   Glucose-Capillary 141 (H) 65 - 99 mg/dL  Glucose, capillary     Status: Abnormal   Collection Time: 08/05/15  2:46 AM  Result Value Ref Range   Glucose-Capillary 137 (H) 65 - 99 mg/dL  Triglycerides     Status: Abnormal   Collection Time: 08/05/15  4:51 AM  Result Value Ref Range   Triglycerides >  5000 (H) <150 mg/dL  Glucose, capillary     Status: Abnormal   Collection Time: 08/05/15  5:59 AM  Result Value Ref Range   Glucose-Capillary 112 (H) 65 - 99 mg/dL  Glucose, capillary     Status: Abnormal   Collection Time: 08/05/15  8:52 AM  Result Value Ref Range   Glucose-Capillary 117 (H) 65 - 99 mg/dL  Basic metabolic panel     Status: Abnormal   Collection Time: 08/05/15 11:26 AM  Result Value Ref Range   Sodium 127 (L) 135 - 145 mmol/L   Potassium 6.8 (HH) 3.5 - 5.1 mmol/L   Chloride 91 (L) 101 - 111 mmol/L   CO2 32 22 - 32 mmol/L   Glucose, Bld 101 (H) 65 - 99 mg/dL   BUN <5 (L) 6 - 20 mg/dL   Creatinine, Ser 1.27 (H) 0.61 - 1.24 mg/dL   Calcium 8.5 (L) 8.9 - 10.3 mg/dL   GFR calc non Af Amer >60 >60 mL/min   GFR calc Af Amer >60 >60 mL/min   Anion gap 4  (L) 5 - 15  Glucose, capillary     Status: Abnormal   Collection Time: 08/05/15 11:47 AM  Result Value Ref Range   Glucose-Capillary 101 (H) 65 - 99 mg/dL  Potassium     Status: Abnormal   Collection Time: 08/05/15  1:09 PM  Result Value Ref Range   Potassium 5.7 (H) 3.5 - 5.1 mmol/L  Glucose, capillary     Status: Abnormal   Collection Time: 08/05/15  3:06 PM  Result Value Ref Range   Glucose-Capillary 116 (H) 65 - 99 mg/dL  Glucose, capillary     Status: Abnormal   Collection Time: 08/05/15  7:52 PM  Result Value Ref Range   Glucose-Capillary 131 (H) 65 - 99 mg/dL     Assessment Acute pancreatitis due to hypertriglyceridemia Familial hypertriglyceridemia Uncontrolled type 2 diabetes -- Hgb A1c 11.7%  Plan 1. Continue IV insulin and IV dextrose until serum Tg level <1000 2. Follow daily Tg level (today lab reported Tg level = 6422) 3. Keep NPO 4. Anticipate starting fibrate and fish oil in next 1-2 days, depending on clinical condition 5. Anticipate starting metformin and SQ insulin once ready to transition off IV insulin  Will follow along with you.

## 2015-08-05 NOTE — Progress Notes (Signed)
Patient uses CPAP while resting in bed.  Complains of abdominal pain that is being managed by IV Dilaudid.  Patient insulin drip to continue  Per Dr. Gabriel Carina for next couple of days.

## 2015-08-05 NOTE — Progress Notes (Signed)
MD called to repeat potassium, and ordered EKG.    Lab called afterwards to advise that blood was drawn with largest needle and appears to have hemolyzed.  Repeating lab draw.

## 2015-08-05 NOTE — Progress Notes (Signed)
Potassium rechecked and MD notified.  Plan to continue to watch and recheck tomorrow.  Patient already on telemetry

## 2015-08-05 NOTE — Progress Notes (Signed)
Pastoral Care and prayer provided for patient. °

## 2015-08-05 NOTE — Progress Notes (Addendum)
Paged by Durel Salts, RN, Director of 1A Orthopedics regarding patient on unit with insulin drip. Patient was transferred out of ICU on IV insulin drip during the night around 1 am to the orthopedic unit. Current scope of practice for titratable insulin drips using the GlucoStabilizer at [facility] is for any patient on a titratable insulin drip to be in ICU as an ICU or step down patient. With this particular patient, insulin drip is not being titrated and glucose is being monitored every 3 hours and patient is on telemetry at this time. Discussed with Curlene Labrum further in detail to determine if patient needs to be transferred back to the ICU. Under the current scope of service for nursing staff on 1A, if a patient has frequent monitoring (not to exceed Q4H for greater than 1-2 hours) then patient is not suitable to be cared for on this particular unit. Therefore, appropriate level of care and placement needs to be re-evaluated.  Thanks, Barnie Alderman, RN, MSN, CCRN, CDE Diabetes Coordinator Inpatient Diabetes Program [phone] (Team Pager from [place] to [place]) [phone] ([facility] office) [phone] [facility] office) [phone] [facility] office)

## 2015-08-06 LAB — GLUCOSE, CAPILLARY
GLUCOSE-CAPILLARY: 96 mg/dL (ref 65–99)
GLUCOSE-CAPILLARY: 103 mg/dL — AB (ref 65–99)
GLUCOSE-CAPILLARY: 113 mg/dL — AB (ref 65–99)
Glucose-Capillary: 118 mg/dL — ABNORMAL HIGH (ref 65–99)
Glucose-Capillary: 110 mg/dL — ABNORMAL HIGH (ref 65–99)

## 2015-08-06 LAB — TRIGLYCERIDES: Triglycerides: 3554 mg/dL — ABNORMAL HIGH (ref <150)

## 2015-08-06 LAB — PLATELET COUNT: Platelets: 279 10*3/uL (ref 150–440)

## 2015-08-06 LAB — BASIC METABOLIC PANEL
Anion gap: 7 (ref 5–15)
BUN: <5 mg/dL — ABNORMAL LOW (ref 6–20)
CHLORIDE: 91 mmol/L — AB (ref 101–111)
CO2: 32 mmol/L (ref 22–32)
Calcium: 8.3 mg/dL — ABNORMAL LOW (ref 8.9–10.3)
Creatinine, Ser: 0.92 mg/dL (ref 0.61–1.24)
GFR calc Af Amer: >60 mL/min (ref >60)
GFR calc non Af Amer: >60 mL/min (ref >60)
Glucose, Bld: 126 mg/dL — ABNORMAL HIGH (ref 65–99)
POTASSIUM: 3.6 mmol/L (ref 3.5–5.1)
SODIUM: 130 mmol/L — AB (ref 135–145)

## 2015-08-06 MED ORDER — GEMFIBROZIL 600 MG PO TABS
600.0000 mg | ORAL_TABLET | Freq: Two times a day (BID) | ORAL | Status: DC
  Administered 2015-08-07 – 2015-08-15 (×17): 600 mg via ORAL
  Filled 2015-08-06 (×17): qty 1

## 2015-08-06 MED ORDER — OMEGA-3-ACID ETHYL ESTERS 1 G PO CAPS
1.0000 g | ORAL_CAPSULE | Freq: Two times a day (BID) | ORAL | Status: DC
  Administered 2015-08-07 – 2015-08-11 (×9): 1 g via ORAL
  Filled 2015-08-06 (×9): qty 1

## 2015-08-06 MED ORDER — HYDROCODONE-ACETAMINOPHEN 5-325 MG PO TABS
2.0000 | ORAL_TABLET | ORAL | Status: DC | PRN
  Administered 2015-08-07 – 2015-08-09 (×6): 2 via ORAL
  Filled 2015-08-06 (×6): qty 2

## 2015-08-06 NOTE — Progress Notes (Signed)
Nutrition Follow-up   INTERVENTION:   Coordination of Care: await diet advancement as medically able. RD notes per Endocrinology MD note, remain NPO at this time. Recommend new weight as last weight on 10/22. Education: will review/educate pt on hypertriglyceridemia/carbohydrate modified nutrition therapy when pt able to eat and tolerate solids on follow Medical Food Supplement Therapy: will recommend supplement once diet order advanced as needed   NUTRITION DIAGNOSIS:   Inadequate oral intake related to acute illness as evidenced by  (CL diet order, taking sips of water).  GOAL:   Patient will meet greater than or equal to 90% of their needs  MONITOR:    (Energy Intake, Anthropometrics, Electrolyte and Renal Profile, Gastrointestinal Profile, Glucose Profile)  REASON FOR ASSESSMENT:   Diagnosis    ASSESSMENT:   Pt admitted with abdominal pain secondary to pancreatitis. Pt with h/o pancreatitis and hypertriglyceridemia. Per MD note, pt s/p gastric sleeve procedure in February 2016.  Per endocrinology MD note, pt stopped taking fibrate, fish oil, and diabetic medications after bariatric surgery April 2016 and has not been checking blood sugars. Currently pt maintained on IV insulin and D10 IVF, monitoring TG and FSBS. Pt continues to c/o abdominal pain but improving per MD note.  Diet Order:  Diet NPO time specified Except for: Ice Chips, Sips with Meds    Current Nutrition: Pt NPO/CL since admission day 5   Gastrointestinal Profile: Last BM: 08/04/2015   Scheduled Medications:  . aspirin EC  81 mg Oral Daily  . heparin  5,000 Units Subcutaneous 3 times per day  . pantoprazole  40 mg Oral Daily  . sertraline  50 mg Oral Daily    Pt receiving dilaudid, norco, zofran prn per documentation  Continuous Medications:  . dextrose 150 mL/hr at 08/06/15 1458  . insulin (NOVOLIN-R) infusion 10.8 mL/hr at 08/06/15 1458     Electrolyte/Renal Profile and Glucose Profile:    Recent Labs Lab 08/04/15 0548 08/05/15 1126 08/05/15 1309 08/06/15 0424  NA 126* 127*  --  130*  K 3.8 6.8* 5.7* 3.6  CL 91* 91*  --  91*  CO2 29 32  --  32  BUN <5* <5*  --  <5*  CREATININE 0.55* 1.27*  --  0.92  CALCIUM 8.4* 8.5*  --  8.3*  MG 3.5*  --   --   --   GLUCOSE 169* 101*  --  126*   Protein Profile:  Recent Labs Lab 08/01/15 2035  ALBUMIN 5.4*   Lipid Panel     Component Value Date/Time   CHOL 718* 08/01/2015 2209   CHOL 357* 09/04/2014 0328   CHOL 1116* 04/27/2012 0939   TRIG 3554* 08/06/2015 0424   TRIG 1201* 11/04/2014 0427   HDL UNABLE TO PERFORM DUE TO LIPEMIC INTERFERENCE 08/01/2015 2209   HDL SEE COMMENT 09/04/2014 0328   HDL 60 04/27/2012 0939   CHOLHDL SEE COMMENTS 08/01/2015 2209   CHOLHDL 18.6* 04/27/2012 0939   VLDL SEE COMMENTS 08/01/2015 2209   VLDL SEE COMMENT 09/04/2014 0328   LDLCALC SEE COMMENTS 08/01/2015 2209   LDLCALC SEE COMMENT 09/04/2014 0328   LDLCALC Comment 04/27/2012 0939   LDLDIRECT 111.4 06/30/2012 0950    Lab Results  Component Value Date   HGBA1C 11.7* 08/04/2015    Weight Trend since Admission: Filed Weights   08/01/15 2023 08/02/15 0100  Weight: 240 lb (108.863 kg) 236 lb 1.8 oz (107.1 kg)    BMI:  Body mass index is 32.02 kg/(m^2).  Estimated Nutritional  Needs:   Kcal:  BEE: 1768kcals, TEE: (IF 1.1-1.3)(AF 1.2) 2332-2756kcals, using IBW of 80.9kg  Protein:  65-89g protein (0.8-1.0g/kg)   Fluid:  2023-2471mL of fluid (25-53mL/kG)  EDUCATION NEEDS:   Education needs no appropriate at this time   [place], RD, LDN Pager [phone]

## 2015-08-06 NOTE — Progress Notes (Addendum)
Sergio [name] is a 39 y.o. male seen in follow up for hypertriglyceridemia induced acute pancreatitis. Still maintained on IV insulin at 10.8 units/hr and IV D5 at 150 cc/hr. Sugars have been in the 90-130 range. He continues to report abd pain, now 6/10. Reports he has a slight appetite and is willing to try clear liquids.   Family at bedside. Notified [place] and his wife about his A1c of 11.7% c/w uncontrolled diabetes. Again, he has been off all medications for hypertriglyceridemia and diabetes since prior to bariatric surgery, in 01/2015. He was managed previously with an insulin pump. Reports he has since given away his pump. He is willing to restart insulin and restart insulin pump therapy. Wife is concerned his diet prior to admission was not good and that he's been eating fast food. He has had about a 20 lb weight gain in the last 3 months. He denies having had inc'd frequency of urination or blurred vision.  Medical history Past Medical History  Diagnosis Date  . Recurrent acute pancreatitis     secondary to hypertriglyceridemia   . Gastric ulcer 2009  . Sleep apnea 1999    uses CPAP  . Chicken pox   . Diabetes mellitus   . HTN (hypertension)   . Familial hypertriglyceridemia     severe  . Morbid obesity [facility])     s/p bariatric sleeve surgery 01/2015    Surgical history Past Surgical History  Procedure Laterality Date  . Tonsillectomy  2003  . Vasectomy    . Lumbar disc surgery  2008  . Cholecystectomy    . Laparoscopic gastric sleeve resection       Medications . aspirin EC  81 mg Oral Daily  . heparin  5,000 Units Subcutaneous 3 times per day  . pantoprazole  40 mg Oral Daily  . sertraline  50 mg Oral Daily  IV insulin IV dextrose   Social history  Married. Employed. Social History  Substance Use Topics  . Smoking status: Never Smoker   . Smokeless tobacco: Never Used  . Alcohol Use: No     Comment: once month     Family history Family History  Problem  Relation Age of Onset  . Adopted: Yes  . Hypertension Mother   . Hyperlipidemia Mother   . Hypertension Father   . Hyperlipidemia Father      Review of systems CV: no chest pain or palpitations PULM: no cough or shortness of breath GEN: No fevers or chills  Physical Exam BP 124/69 mmHg  Pulse 80  Temp(Src) 98.8 F (37.1 C) (Oral)  Resp 16  Ht 6' (1.829 m)  Wt 107.1 kg (236 lb 1.8 oz)  BMI 32.02 kg/m2  SpO2 98%  GEN: Obese white  male, in NAD. HEENT: No proptosis, EOMI, lid lag or stare. Oropharynx is clear.  NECK: supple, trachea midline.   RESPIRATORY: clear bilaterally, no wheeze, good inspiratory effort. CV: No carotid bruits, RRR. MUSCULOSKELETAL: normal gait and station, no clubbing, no tremor ABD: soft, +diffuse non focal TTP, no guarding EXT: no peripheral edema SKIN: no dermatopathy or rash or acanthosis nigricans.  LYMPH: no submandibular or supraclavicular LAD NEURO: PERRL, 2+ knee DTRs equal and symmetric.  PSYC: alert and oriented, good insight  Labs Results for orders placed or performed during the hospital encounter of 08/01/15 (from the past 24 hour(s))  Glucose, capillary     Status: Abnormal   Collection Time: 08/05/15  7:52 PM  Result Value Ref Range  Glucose-Capillary 131 (H) 65 - 99 mg/dL  Glucose, capillary     Status: Abnormal   Collection Time: 08/05/15 11:43 PM  Result Value Ref Range   Glucose-Capillary 152 (H) 65 - 99 mg/dL  Platelet count     Status: None   Collection Time: 08/06/15  4:24 AM  Result Value Ref Range   Platelets 279 150 - 440 K/uL  Basic metabolic panel     Status: Abnormal   Collection Time: 08/06/15  4:24 AM  Result Value Ref Range   Sodium 130 (L) 135 - 145 mmol/L   Potassium 3.6 3.5 - 5.1 mmol/L   Chloride 91 (L) 101 - 111 mmol/L   CO2 32 22 - 32 mmol/L   Glucose, Bld 126 (H) 65 - 99 mg/dL   BUN <5 (L) 6 - 20 mg/dL   Creatinine, Ser 0.92 0.61 - 1.24 mg/dL   Calcium 8.3 (L) 8.9 - 10.3 mg/dL   GFR calc non Af  Amer >60 >60 mL/min   GFR calc Af Amer >60 >60 mL/min   Anion gap 7 5 - 15  Triglycerides     Status: Abnormal   Collection Time: 08/06/15  4:24 AM  Result Value Ref Range   Triglycerides 3554 (H) <150 mg/dL  Glucose, capillary     Status: None   Collection Time: 08/06/15  5:26 AM  Result Value Ref Range   Glucose-Capillary 96 65 - 99 mg/dL  Glucose, capillary     Status: Abnormal   Collection Time: 08/06/15  7:26 AM  Result Value Ref Range   Glucose-Capillary 103 (H) 65 - 99 mg/dL   Comment 1 Notify RN   Glucose, capillary     Status: Abnormal   Collection Time: 08/06/15 11:10 AM  Result Value Ref Range   Glucose-Capillary 113 (H) 65 - 99 mg/dL   Comment 1 Notify RN   Glucose, capillary     Status: Abnormal   Collection Time: 08/06/15  3:32 PM  Result Value Ref Range   Glucose-Capillary 118 (H) 65 - 99 mg/dL   Comment 1 Notify RN    Lab Results  Component Value Date   HGBA1C 11.7* 08/04/2015     Assessment Acute pancreatitis due to hypertriglyceridemia Familial hypertriglyceridemia Uncontrolled type 2 diabetes -- Hgb A1c 11.7%  Plan 1. Continue IV insulin and IV dextrose until serum Tg level <1000 2. Follow daily Tg level (today lab reported Tg level = 6422) 3. Will start sugar free liquids tonight. 4. Will restart fibrate and fish oil tomorrow. 5. If he tolerates a diet over next 24-48 hrs, then will add metformin. 6. Anticipate transitioning to SQ insulin once his Tg level <1000. 7. Will plan to get new insulin pump as out-patient. Plan to have him completed pump paperwork during this hospitalization and he and wife are in agreement. Answered their questions at length about diet and use of insulin pump and use of a continuous glucose monitor.  Time spent with patient was greater than 45 min.  Patient will be seen tomorrow by Dr. Graceann Congress.  I will return on Friday 08/08/15.

## 2015-08-06 NOTE — Progress Notes (Signed)
Patient cooperative with care.  MD advanced diet later in afternoon.  Pain being managed with IV Dilaudid.  Per Dr. Verdell Carmine, Dr. Gabriel Carina wants to continue IV pain meds at this present time.   When transitioning to po pain med patient stated today that 2 Norco have worked in the past.  CBG levels have remained constant this shift.

## 2015-08-06 NOTE — Progress Notes (Signed)
[place] [facility] [place] NAME: Sergio [name]    MR#:  [id number]  DATE OF BIRTH:  May 27, 1976  SUBJECTIVE:  CHIEF COMPLAINT:   Chief Complaint  Patient presents with  . Abdominal Pain   Still having some abdominal pain but improved.  No N/V.  Trig level >3000.   REVIEW OF SYSTEMS:    Review of Systems  Constitutional: Negative for fever and chills.  HENT: Negative for congestion and tinnitus.   Eyes: Negative for blurred vision and double vision.  Respiratory: Negative for cough, shortness of breath and wheezing.   Cardiovascular: Negative for chest pain, orthopnea and PND.  Gastrointestinal: Positive for nausea and abdominal pain. Negative for vomiting and diarrhea.  Genitourinary: Negative for dysuria and hematuria.  Neurological: Negative for dizziness, sensory change and focal weakness.  All other systems reviewed and are negative.   Nutrition: NPO Tolerating Diet: No Tolerating PT: Ambulatory   DRUG ALLERGIES:   Allergies  Allergen Reactions  . Codeine Anaphylaxis  . Ivp Dye [Iodinated Diagnostic Agents] Other (See Comments)    Kidneys stop working    VITALS:  Blood pressure 118/73, pulse 77, temperature 96.9 F (36.1 C), temperature source Axillary, resp. rate 16, height 6' (1.829 m), weight 107.1 kg (236 lb 1.8 oz), SpO2 98 %.  PHYSICAL EXAMINATION:   Physical Exam  GENERAL:  39 y.o.-year-old obese patient lying in the bed in no acute distress.  EYES: Pupils equal, round, reactive to light and accommodation. No scleral icterus. Extraocular muscles intact.  HEENT: Head atraumatic, normocephalic. Oropharynx and nasopharynx clear.  NECK:  Supple, no jugular venous distention. No thyroid enlargement, no tenderness.  LUNGS: Normal breath sounds bilaterally, no wheezing, rales, rhonchi. No use of accessory muscles of respiration.  CARDIOVASCULAR: S1, S2 normal. No murmurs, rubs, or gallops.  ABDOMEN: Soft,  nondistended, tender in the epigastric area but no rebound, rigidity. Bowel sounds present. No organomegaly or mass.  EXTREMITIES: No cyanosis, clubbing or edema b/l.    NEUROLOGIC: Cranial nerves II through XII are intact. No focal Motor or sensory deficits b/l.   PSYCHIATRIC: The patient is alert and oriented x 3. Good affect SKIN: No obvious rash, lesion, or ulcer.    LABORATORY PANEL:   CBC  Recent Labs Lab 08/03/15 0515 08/06/15 0424  WBC 5.9  --   HGB 13.3  --   HCT 36.4*  --   PLT 211 279   ------------------------------------------------------------------------------------------------------------------  Chemistries   Recent Labs Lab 08/01/15 2035  08/04/15 0548  08/06/15 0424  NA 114*  < > 126*  < > 130*  K 3.2*  < > 3.8  < > 3.6  CL 83*  < > 91*  < > 91*  CO2 21*  < > 29  < > 32  GLUCOSE 183*  < > 169*  < > 126*  BUN UNABLE TO REPORT DUE TO LIPEMIC INTERFERENCE  < > <5*  < > <5*  CREATININE UNABLE TO REPORT DUE TO LIPEMIC INTERFERENCE  < > 0.55*  < > 0.92  CALCIUM 8.4*  < > 8.4*  < > 8.3*  MG  --   --  3.5*  --   --   AST UNABLE TO REPORT DUE TO LIPEMIC INTERFERENCE  --   --   --   --   ALT UNABLE TO REPORT DUE TO LIPEMIC INTERFERENCE  --   --   --   --   ALKPHOS UNABLE TO REPORT  DUE TO LIPEMIC INTERFERENCE  --   --   --   --   BILITOT <0.1*  --   --   --   --   < > = values in this interval not displayed. ------------------------------------------------------------------------------------------------------------------  Cardiac Enzymes No results for input(s): TROPONINI in the last 168 hours. ------------------------------------------------------------------------------------------------------------------  RADIOLOGY:  No results found.   ASSESSMENT AND PLAN:   39 year old male with past medical history of hypertriglyceridemia, acute pancreatitis, diabetes, morbid obesity, obstructive sleep apnea, who presents to the hospital abdominal pain and noted to  have significant hypertriglyceridemia.  #1 acute pancreatitis with hypertriglyceridemia-slow to improve.  - continue supportive care with IV fluids, antiemetics, pain control. - Trig level after dilution today > 3554.    #2 severe hypertriglyceridemia-continue supportive care. Continue insulin drip, dextrose. -Follow and continue insulin drip until triglyceride levels drop below 1000.  - Trig level today > 3554 today.  - appreciate Endocrine input and will initiate Fibrate therapy in a day or two.   #3 hyponatremia-likely pseudohyponatremia from the paraproteinemia and hypertriglyceridemia. - improving.   #4 anxiety-continue Xanax.  #5 depression-continue Zoloft.  #6 Hyperkalemia -it was hemolyzed as per Lab.  Potassium level normal now.  - will d/c tele.   All the records are reviewed and case discussed with Care Management/Social Workerr. Management plans discussed with the patient, family and they are in agreement.  CODE STATUS: Full  DVT Prophylaxis: Heparin subcutaneous  TOTAL TIME TAKING CARE OF THIS PATIENT: 25 minutes.   POSSIBLE D/C IN 2-3 DAYS, DEPENDING ON CLINICAL CONDITION.   Henreitta Leber M.D on 08/06/2015 at 1:08 PM  Between 7am to 6pm - Pager - [phone]  After 6pm go to [email or web address] - password [email or web address]  Office  [phone]  CC: Primary care physician; Crecencio Mc, MD

## 2015-08-07 LAB — GLUCOSE, CAPILLARY
GLUCOSE-CAPILLARY: 103 mg/dL — AB (ref 65–99)
GLUCOSE-CAPILLARY: 120 mg/dL — AB (ref 65–99)
Glucose-Capillary: 105 mg/dL — ABNORMAL HIGH (ref 65–99)
Glucose-Capillary: 105 mg/dL — ABNORMAL HIGH (ref 65–99)
Glucose-Capillary: 110 mg/dL — ABNORMAL HIGH (ref 65–99)
Glucose-Capillary: 116 mg/dL — ABNORMAL HIGH (ref 65–99)
Glucose-Capillary: 136 mg/dL — ABNORMAL HIGH (ref 65–99)

## 2015-08-07 LAB — CBC
HEMATOCRIT: 37.1 % — AB (ref 40.0–52.0)
HEMOGLOBIN: 13.6 g/dL (ref 13.0–18.0)
MCH: 30.1 pg (ref 26.0–34.0)
MCHC: 36.7 g/dL — ABNORMAL HIGH (ref 32.0–36.0)
MCV: 82.2 fL (ref 80.0–100.0)
Platelets: 267 10*3/uL (ref 150–440)
RBC: 4.51 MIL/uL (ref 4.40–5.90)
RDW: 15.7 % — ABNORMAL HIGH (ref 11.5–14.5)
WBC: 3.9 10*3/uL (ref 3.8–10.6)

## 2015-08-07 LAB — TRIGLYCERIDES: Triglycerides: 3144 mg/dL — ABNORMAL HIGH (ref <150)

## 2015-08-07 NOTE — Progress Notes (Signed)
Pt states that he will place his CPAP when he is ready for sleep. Pt doesn't need any help with placement.

## 2015-08-07 NOTE — Progress Notes (Signed)
Sergio [name] is a 39 y.o. male seen in follow up for hypertriglyceridemia induced acute pancreatitis. Still maintained on IV insulin at 10.8 units/hr and IV D5 at 150 cc/hr. Sugars have been in the 90-110 range.   He is tolerating clear liquids, denies nausea or vomiting. He still has significant abdominal pain but reports improvement since presentation. Receiving norco and dilaudid prn.  Medical history Past Medical History  Diagnosis Date  . Recurrent acute pancreatitis     secondary to hypertriglyceridemia   . Gastric ulcer 2009  . Sleep apnea 1999    uses CPAP  . Chicken pox   . Diabetes mellitus   . HTN (hypertension)   . Familial hypertriglyceridemia     severe  . Morbid obesity [facility])     s/p bariatric sleeve surgery 01/2015    Surgical history Past Surgical History  Procedure Laterality Date  . Tonsillectomy  2003  . Vasectomy    . Lumbar disc surgery  2008  . Cholecystectomy    . Laparoscopic gastric sleeve resection       Medications . aspirin EC  81 mg Oral Daily  . gemfibrozil  600 mg Oral BID AC  . heparin  5,000 Units Subcutaneous 3 times per day  . omega-3 acid ethyl esters  1 g Oral BID  . pantoprazole  40 mg Oral Daily  . sertraline  50 mg Oral Daily  IV insulin IV dextrose   Social history  Married. Employed. Social History  Substance Use Topics  . Smoking status: Never Smoker   . Smokeless tobacco: Never Used  . Alcohol Use: No     Comment: once month     Family history Family History  Problem Relation Age of Onset  . Adopted: Yes  . Hypertension Mother   . Hyperlipidemia Mother   . Hypertension Father   . Hyperlipidemia Father      Review of systems CV: no chest pain or palpitations PULM: no cough or shortness of breath GEN: No fevers or chills  Physical Exam BP 125/84 mmHg  Pulse 71  Temp(Src) 98.5 F (36.9 C) (Oral)  Resp 20  Ht 6' (1.829 m)  Wt 107.1 kg (236 lb 1.8 oz)  BMI 32.02 kg/m2  SpO2 98%  GEN: Obese white   male, in NAD.  HEENT: eyes anicteric, MMM RESPIRATORY: breathing unlabored on room air EXT: no peripheral edema SKIN: no rash  NEURO: AAOx3, nonfocal exam.  PSYC: alert and oriented, good insight  Labs Results for orders placed or performed during the hospital encounter of 08/01/15 (from the past 24 hour(s))  Glucose, capillary     Status: Abnormal   Collection Time: 08/06/15  3:32 PM  Result Value Ref Range   Glucose-Capillary 118 (H) 65 - 99 mg/dL   Comment 1 Notify RN   Glucose, capillary     Status: Abnormal   Collection Time: 08/06/15  9:12 PM  Result Value Ref Range   Glucose-Capillary 110 (H) 65 - 99 mg/dL   Comment 1 Notify RN   Glucose, capillary     Status: Abnormal   Collection Time: 08/07/15 12:03 AM  Result Value Ref Range   Glucose-Capillary 105 (H) 65 - 99 mg/dL   Comment 1 Notify RN   CBC     Status: Abnormal   Collection Time: 08/07/15  4:20 AM  Result Value Ref Range   WBC 3.9 3.8 - 10.6 K/uL   RBC 4.51 4.40 - 5.90 MIL/uL   Hemoglobin 13.6 13.0 -  18.0 g/dL   HCT 37.1 (L) 40.0 - 52.0 %   MCV 82.2 80.0 - 100.0 fL   MCH 30.1 26.0 - 34.0 pg   MCHC 36.7 (H) 32.0 - 36.0 g/dL   RDW 15.7 (H) 11.5 - 14.5 %   Platelets 267 150 - 440 K/uL  Triglycerides     Status: Abnormal   Collection Time: 08/07/15  4:20 AM  Result Value Ref Range   Triglycerides 3144 (H) <150 mg/dL  Glucose, capillary     Status: Abnormal   Collection Time: 08/07/15  4:33 AM  Result Value Ref Range   Glucose-Capillary 105 (H) 65 - 99 mg/dL   Comment 1 Notify RN   Glucose, capillary     Status: Abnormal   Collection Time: 08/07/15  8:33 AM  Result Value Ref Range   Glucose-Capillary 103 (H) 65 - 99 mg/dL   Lab Results  Component Value Date   HGBA1C 11.7* 08/04/2015     Assessment Acute pancreatitis due to hypertriglyceridemia Familial hypertriglyceridemia Uncontrolled type 2 diabetes -- Hgb A1c 11.7%  Plan 1. Continue IV insulin and IV dextrose until serum Tg level <1000 2.  Follow daily Tg level (today lab reported Tg level = 3144) 3. Cont sugar free liquids. 4. Cont fibrate and fish oil. 5. May add metformin tomorrow depending on how he does. 6. Anticipate transitioning to SQ insulin once his Tg level <1000. 7. Plan is to start new insulin pump as out-patient. Pt and his wife discussed this at length with Dr. Gabriel Carina. He has the necessary paperwork and will complete today. Dr. Gabriel Carina will return to see him tomorrow.    Atha Starks, MD [facility] Endocrinology

## 2015-08-07 NOTE — Progress Notes (Signed)
[place] at [place] NAME: Sergio [name]    MR#:  [id number]  DATE OF BIRTH:  03/20/1976  SUBJECTIVE:  CHIEF COMPLAINT:   Chief Complaint  Patient presents with  . Abdominal Pain   Still having some abdominal pain but improving.  No N/V.  Trig level 3144. Remains on Insulin gtt and Dextrose.   REVIEW OF SYSTEMS:    Review of Systems  Constitutional: Negative for fever and chills.  HENT: Negative for congestion and tinnitus.   Eyes: Negative for blurred vision and double vision.  Respiratory: Negative for cough, shortness of breath and wheezing.   Cardiovascular: Negative for chest pain, orthopnea and PND.  Gastrointestinal: Positive for nausea and abdominal pain. Negative for vomiting and diarrhea.  Genitourinary: Negative for dysuria and hematuria.  Neurological: Negative for dizziness, sensory change and focal weakness.  All other systems reviewed and are negative.   Nutrition: NPO Tolerating Diet: No Tolerating PT: Ambulatory   DRUG ALLERGIES:   Allergies  Allergen Reactions  . Codeine Anaphylaxis  . Ivp Dye [Iodinated Diagnostic Agents] Other (See Comments)    Kidneys stop working    VITALS:  Blood pressure 125/84, pulse 71, temperature 98.5 F (36.9 C), temperature source Oral, resp. rate 20, height 6' (1.829 m), weight 107.1 kg (236 lb 1.8 oz), SpO2 98 %.  PHYSICAL EXAMINATION:   Physical Exam  GENERAL:  39 y.o.-year-old obese patient lying in the bed in no acute distress.  EYES: Pupils equal, round, reactive to light and accommodation. No scleral icterus. Extraocular muscles intact.  HEENT: Head atraumatic, normocephalic. Oropharynx and nasopharynx clear.  NECK:  Supple, no jugular venous distention. No thyroid enlargement, no tenderness.  LUNGS: Normal breath sounds bilaterally, no wheezing, rales, rhonchi. No use of accessory muscles of respiration.  CARDIOVASCULAR: S1, S2 normal. No murmurs, rubs, or  gallops.  ABDOMEN: Soft, nondistended, tender in the epigastric area but no rebound, rigidity. Bowel sounds present. No organomegaly or mass.  EXTREMITIES: No cyanosis, clubbing or edema b/l.    NEUROLOGIC: Cranial nerves II through XII are intact. No focal Motor or sensory deficits b/l.   PSYCHIATRIC: The patient is alert and oriented x 3. Good affect SKIN: No obvious rash, lesion, or ulcer.    LABORATORY PANEL:   CBC  Recent Labs Lab 08/07/15 0420  WBC 3.9  HGB 13.6  HCT 37.1*  PLT 267   ------------------------------------------------------------------------------------------------------------------  Chemistries   Recent Labs Lab 08/01/15 2035  08/04/15 0548  08/06/15 0424  NA 114*  < > 126*  < > 130*  K 3.2*  < > 3.8  < > 3.6  CL 83*  < > 91*  < > 91*  CO2 21*  < > 29  < > 32  GLUCOSE 183*  < > 169*  < > 126*  BUN UNABLE TO REPORT DUE TO LIPEMIC INTERFERENCE  < > <5*  < > <5*  CREATININE UNABLE TO REPORT DUE TO LIPEMIC INTERFERENCE  < > 0.55*  < > 0.92  CALCIUM 8.4*  < > 8.4*  < > 8.3*  MG  --   --  3.5*  --   --   AST UNABLE TO REPORT DUE TO LIPEMIC INTERFERENCE  --   --   --   --   ALT UNABLE TO REPORT DUE TO LIPEMIC INTERFERENCE  --   --   --   --   ALKPHOS UNABLE TO REPORT DUE TO LIPEMIC INTERFERENCE  --   --   --   --  BILITOT <0.1*  --   --   --   --   < > = values in this interval not displayed. ------------------------------------------------------------------------------------------------------------------  Cardiac Enzymes No results for input(s): TROPONINI in the last 168 hours. ------------------------------------------------------------------------------------------------------------------  RADIOLOGY:  No results found.   ASSESSMENT AND PLAN:   39 year old male with past medical history of hypertriglyceridemia, acute pancreatitis, diabetes, morbid obesity, obstructive sleep apnea, who presents to the hospital abdominal pain and noted to have  significant hypertriglyceridemia.  #1 acute pancreatitis with hypertriglyceridemia-slow to improve.  - continue supportive care with IV fluids, antiemetics, pain control. - Trig level after dilution today 3144 and trending down.    #2 severe hypertriglyceridemia-continue supportive care. Continue insulin drip, dextrose. -Follow and continue insulin drip until triglyceride levels drop below 1000.  - Trig level 3544 --> 3144 today. Cont. Fibrate as per Endocrinology.   #3 hyponatremia-likely pseudohyponatremia from the paraproteinemia and hypertriglyceridemia. - resolved as triglyceride level is improving.   #4 anxiety-continue Xanax.  #5 depression-continue Zoloft.  #6 Hyperkalemia - improved & resolved.   All the records are reviewed and case discussed with Care Management/Social Workerr. Management plans discussed with the patient, family and they are in agreement.  CODE STATUS: Full  DVT Prophylaxis: Heparin subcutaneous  TOTAL TIME TAKING CARE OF THIS PATIENT: 37 minutes.   POSSIBLE D/C IN 2-3 DAYS, DEPENDING ON CLINICAL CONDITION.   Henreitta Leber M.D on 08/07/2015 at 12:17 PM  Between 7am to 6pm - Pager - [phone]  After 6pm go to [email or web address] - password [email or web address] [place] [facility]  Office  [phone]  CC: Primary care physician; Crecencio Mc, MD

## 2015-08-08 LAB — BASIC METABOLIC PANEL
BUN: <5 mg/dL — ABNORMAL LOW (ref 6–20)
CALCIUM: 8.5 mg/dL — AB (ref 8.9–10.3)
CO2: 27 mmol/L (ref 22–32)
Chloride: 95 mmol/L — ABNORMAL LOW (ref 101–111)
Creatinine, Ser: 0.82 mg/dL (ref 0.61–1.24)
GFR calc Af Amer: >60 mL/min (ref >60)
GFR calc non Af Amer: >60 mL/min (ref >60)
GLUCOSE: 102 mg/dL — AB (ref 65–99)
POTASSIUM: 3.1 mmol/L — AB (ref 3.5–5.1)
Sodium: UNDETERMINED mmol/L (ref 135–145)

## 2015-08-08 LAB — GLUCOSE, CAPILLARY
GLUCOSE-CAPILLARY: 121 mg/dL — AB (ref 65–99)
GLUCOSE-CAPILLARY: 88 mg/dL (ref 65–99)
GLUCOSE-CAPILLARY: 226 mg/dL — AB (ref 65–99)
Glucose-Capillary: 112 mg/dL — ABNORMAL HIGH (ref 65–99)
Glucose-Capillary: 156 mg/dL — ABNORMAL HIGH (ref 65–99)
Glucose-Capillary: 198 mg/dL — ABNORMAL HIGH (ref 65–99)

## 2015-08-08 LAB — TRIGLYCERIDES: Triglycerides: 2418 mg/dL — ABNORMAL HIGH (ref <150)

## 2015-08-08 MED ORDER — POTASSIUM CHLORIDE CRYS ER 20 MEQ PO TBCR
40.0000 meq | EXTENDED_RELEASE_TABLET | Freq: Once | ORAL | Status: AC
  Administered 2015-08-08: 40 meq via ORAL
  Filled 2015-08-08: qty 2

## 2015-08-08 MED ORDER — POTASSIUM CHLORIDE CRYS ER 20 MEQ PO TBCR
20.0000 meq | EXTENDED_RELEASE_TABLET | Freq: Two times a day (BID) | ORAL | Status: DC
  Administered 2015-08-09 – 2015-08-15 (×13): 20 meq via ORAL
  Filled 2015-08-08 (×13): qty 1

## 2015-08-08 NOTE — Care Management (Signed)
Triglyceride >2000 still requiring insulin drip.

## 2015-08-08 NOTE — Progress Notes (Signed)
[place] at [place] NAME: Sergio [name]    MR#:  [id number]  DATE OF BIRTH:  02-Jul-1976  SUBJECTIVE:  CHIEF COMPLAINT:   Chief Complaint  Patient presents with  . Abdominal Pain   Abdominal pain improved.  Tolerating clear liquid diet. No N/V.  Trig level 2418. Remains on Insulin gtt and Dextrose.   REVIEW OF SYSTEMS:    Review of Systems  Constitutional: Negative for fever and chills.  HENT: Negative for congestion and tinnitus.   Eyes: Negative for blurred vision and double vision.  Respiratory: Negative for cough, shortness of breath and wheezing.   Cardiovascular: Negative for chest pain, orthopnea and PND.  Gastrointestinal: Positive for nausea and abdominal pain. Negative for vomiting and diarrhea.  Genitourinary: Negative for dysuria and hematuria.  Neurological: Negative for dizziness, sensory change and focal weakness.  All other systems reviewed and are negative.   Nutrition: Clear liquid Tolerating Diet: Yes Tolerating PT: Ambulatory   DRUG ALLERGIES:   Allergies  Allergen Reactions  . Codeine Anaphylaxis  . Ivp Dye [Iodinated Diagnostic Agents] Other (See Comments)    Kidneys stop working    VITALS:  Blood pressure 118/78, pulse 66, temperature 97.7 F (36.5 C), temperature source Oral, resp. rate 18, height 6' (1.829 m), weight 107.1 kg (236 lb 1.8 oz), SpO2 96 %.  PHYSICAL EXAMINATION:   Physical Exam  GENERAL:  39 y.o.-year-old patient lying in the bed in no acute distress.  EYES: Pupils equal, round, reactive to light and accommodation. No scleral icterus. Extraocular muscles intact.  HEENT: Head atraumatic, normocephalic. Oropharynx and nasopharynx clear.  NECK:  Supple, no jugular venous distention. No thyroid enlargement, no tenderness.  LUNGS: Normal breath sounds bilaterally, no wheezing, rales, rhonchi. No use of accessory muscles of respiration.  CARDIOVASCULAR: S1, S2 normal. No murmurs,  rubs, or gallops.  ABDOMEN: Soft, nondistended, tender in the epigastric area but no rebound, rigidity. Bowel sounds present. No organomegaly or mass.  EXTREMITIES: No cyanosis, clubbing or edema b/l.    NEUROLOGIC: Cranial nerves II through XII are intact. No focal Motor or sensory deficits b/l.   PSYCHIATRIC: The patient is alert and oriented x 3. Good affect SKIN: No obvious rash, lesion, or ulcer.    LABORATORY PANEL:   CBC  Recent Labs Lab 08/07/15 0420  WBC 3.9  HGB 13.6  HCT 37.1*  PLT 267   ------------------------------------------------------------------------------------------------------------------  Chemistries   Recent Labs Lab 08/01/15 2035  08/04/15 0548  08/06/15 0424  NA 114*  < > 126*  < > 130*  K 3.2*  < > 3.8  < > 3.6  CL 83*  < > 91*  < > 91*  CO2 21*  < > 29  < > 32  GLUCOSE 183*  < > 169*  < > 126*  BUN UNABLE TO REPORT DUE TO LIPEMIC INTERFERENCE  < > <5*  < > <5*  CREATININE UNABLE TO REPORT DUE TO LIPEMIC INTERFERENCE  < > 0.55*  < > 0.92  CALCIUM 8.4*  < > 8.4*  < > 8.3*  MG  --   --  3.5*  --   --   AST UNABLE TO REPORT DUE TO LIPEMIC INTERFERENCE  --   --   --   --   ALT UNABLE TO REPORT DUE TO LIPEMIC INTERFERENCE  --   --   --   --   ALKPHOS UNABLE TO REPORT DUE TO LIPEMIC INTERFERENCE  --   --   --   --  BILITOT <0.1*  --   --   --   --   < > = values in this interval not displayed. ------------------------------------------------------------------------------------------------------------------  Cardiac Enzymes No results for input(s): TROPONINI in the last 168 hours. ------------------------------------------------------------------------------------------------------------------  RADIOLOGY:  No results found.   ASSESSMENT AND PLAN:   39 year old male with past medical history of hypertriglyceridemia, acute pancreatitis, diabetes, morbid obesity, obstructive sleep apnea, who presents to the hospital abdominal pain and noted to  have significant hypertriglyceridemia.  #1 acute pancreatitis with hypertriglyceridemia-slow to improve.  - continue supportive care with IV fluids, antiemetics, pain control. - Trig level after dilution today 2418 and trending down.   - tolerating clear liquid diet and will advance to full liquid.   #2 severe hypertriglyceridemia-continue supportive care. Continue insulin drip, dextrose. -Follow and continue insulin drip until triglyceride levels drop below 1000.  - Trig level 3544 --> 3144 --> 2418 today. Cont. Fibrate as per Endocrinology.   #3 hyponatremia-likely pseudohyponatremia from the paraproteinemia and hypertriglyceridemia. - resolved as triglyceride level is improving.   #4 anxiety-continue Xanax.  #5 depression-continue Zoloft.  #6 Hyperkalemia - resolved.    All the records are reviewed and case discussed with Care Management/Social Workerr. Management plans discussed with the patient, family and they are in agreement.  CODE STATUS: Full  DVT Prophylaxis: Heparin subcutaneous  TOTAL TIME TAKING CARE OF THIS PATIENT: 25 minutes.   POSSIBLE D/C IN 2-3 DAYS, DEPENDING ON CLINICAL CONDITION.   Henreitta Leber M.D on 08/08/2015 at 12:28 PM  Between 7am to 6pm - Pager - [phone]  After 6pm go to [email or web address] - password [email or web address] [facility] [facility]  Office  [phone]  CC: Primary care physician; Crecencio Mc, MD

## 2015-08-08 NOTE — Progress Notes (Signed)
[place] notified about pts potasium of 3.1, also of sodium (UNABLE TO PERFORM DUE TO [employer]) result . (Orders given for potasium, see MAR). Will continue to monitor.

## 2015-08-08 NOTE — Progress Notes (Signed)
Nutrition Follow-up      INTERVENTION:  Meals and snacks: Cater to pt preferences, discussed sugar free options with pt while on full liquids    NUTRITION DIAGNOSIS:   Inadequate oral intake related to acute illness as evidenced by  (CL diet order, taking sips of water), tolerating full liquids    GOAL:   Patient will meet greater than or equal to 90% of their needs    MONITOR:    (Energy Intake, Anthropometrics, Electrolyte and Renal Profile, Gastrointestinal Profile, Glucose Profile)  REASON FOR ASSESSMENT:   Diagnosis    ASSESSMENT:     Current Nutrition: tolerating liquids, upset with limited sugar free options available   Gastrointestinal Profile: Last BM: 10/27   Scheduled Medications:  . aspirin EC  81 mg Oral Daily  . gemfibrozil  600 mg Oral BID AC  . heparin  5,000 Units Subcutaneous 3 times per day  . omega-3 acid ethyl esters  1 g Oral BID  . pantoprazole  40 mg Oral Daily  . sertraline  50 mg Oral Daily    Continuous Medications:  . dextrose 150 mL/hr at 08/08/15 1030  . insulin (NOVOLIN-R) infusion 10.8 mL/hr at 08/08/15 1300     Electrolyte/Renal Profile and Glucose Profile:   Recent Labs Lab 08/04/15 0548 08/05/15 1126 08/05/15 1309 08/06/15 0424  NA 126* 127*  --  130*  K 3.8 6.8* 5.7* 3.6  CL 91* 91*  --  91*  CO2 29 32  --  32  BUN <5* <5*  --  <5*  CREATININE 0.55* 1.27*  --  0.92  CALCIUM 8.4* 8.5*  --  8.3*  MG 3.5*  --   --   --   GLUCOSE 169* 101*  --  126*   Protein Profile:  Recent Labs Lab 08/01/15 2035  ALBUMIN 5.4*        Weight Trend since Admission: Filed Weights   08/01/15 2023 08/02/15 0100  Weight: 240 lb (108.863 kg) 236 lb 1.8 oz (107.1 kg)      Diet Order:  Diet full liquid Room service appropriate?: Yes; Fluid consistency:: Thin  Skin:   reviewed   Height:   Ht Readings from Last 1 Encounters:  08/02/15 6' (1.829 m)    Weight:   Wt Readings from Last 1 Encounters:   08/02/15 236 lb 1.8 oz (107.1 kg)     BMI:  Body mass index is 32.02 kg/(m^2).  Estimated Nutritional Needs:   Kcal:  BEE: 1768kcals, TEE: (IF 1.1-1.3)(AF 1.2) 2332-2756kcals, using IBW of 80.9kg  Protein:  65-89g protein (0.8-1.0g/kg)   Fluid:  2023-2449mL of fluid (25-76mL/kG)  EDUCATION NEEDS:   Education needs no appropriate at this time  [place]. Zenia Resides, [place], [place] (pager)

## 2015-08-08 NOTE — Progress Notes (Signed)
Endocrinology follow up   39 y.o. male seen in follow up for hypertriglyceridemia induced acute pancreatitis. Still maintained on IV insulin at 10.8 units/hr and IV D5 at 150 cc/hr. Sugars have been in the 88-130 range. He continues to report abd pain. Tolerating liquid diet. He was advanced to full liquids today and lunch provided includes regular ice cream and regular pudding.  He has not yet started the application for the insulin pump.  Medical history Past Medical History  Diagnosis Date  . Recurrent acute pancreatitis     secondary to hypertriglyceridemia   . Gastric ulcer 2009  . Sleep apnea 1999    uses CPAP  . Chicken pox   . Diabetes mellitus   . HTN (hypertension)   . Familial hypertriglyceridemia     severe  . Morbid obesity [facility])     s/p bariatric sleeve surgery 01/2015    Surgical history Past Surgical History  Procedure Laterality Date  . Tonsillectomy  2003  . Vasectomy    . Lumbar disc surgery  2008  . Cholecystectomy    . Laparoscopic gastric sleeve resection       Medications . aspirin EC  81 mg Oral Daily  . gemfibrozil  600 mg Oral BID AC  . heparin  5,000 Units Subcutaneous 3 times per day  . omega-3 acid ethyl esters  1 g Oral BID  . pantoprazole  40 mg Oral Daily  . sertraline  50 mg Oral Daily  IV insulin IV dextrose   Social history  Married. Employed. Social History  Substance Use Topics  . Smoking status: Never Smoker   . Smokeless tobacco: Never Used  . Alcohol Use: No     Comment: once month     Family history Family History  Problem Relation Age of Onset  . Adopted: Yes  . Hypertension Mother   . Hyperlipidemia Mother   . Hypertension Father   . Hyperlipidemia Father      Review of systems CV: no chest pain or palpitations PULM: no cough or shortness of breath GEN: No fevers or chills  Physical Exam BP 118/78 mmHg  Pulse 66   Temp(Src) 97.7 F (36.5 C) (Oral)  Resp 18  Ht 6' (1.829 m)  Wt 107.1 kg (236 lb 1.8 oz)  BMI 32.02 kg/m2  SpO2 96%  GEN: Obese white  male, in NAD. HEENT: No proptosis, EOMI, lid lag or stare. Oropharynx is clear.  NECK: supple, trachea midline.   RESPIRATORY: clear bilaterally, no wheeze, good inspiratory effort. CV: No carotid bruits, RRR. MUSCULOSKELETAL: normal gait and station, no clubbing, no tremor ABD: soft, +diffuse non focal TTP, no guarding EXT: no peripheral edema SKIN: few xanthomas scattered on flexor surfaces of upper extremities LYMPH: no submandibular or supraclavicular LAD NEURO: PERRL, no dysarthria PSYC: alert and oriented, good insight  Labs Results for orders placed or performed during the hospital encounter of 08/01/15 (from the past 24 hour(s))  Glucose, capillary     Status: Abnormal   Collection Time: 08/07/15  3:44 PM  Result Value Ref Range   Glucose-Capillary 110 (H) 65 - 99 mg/dL   Comment 1 Notify RN   Glucose, capillary     Status: Abnormal   Collection Time: 08/07/15  7:53 PM  Result Value Ref Range   Glucose-Capillary 116 (H) 65 - 99 mg/dL  Glucose, capillary     Status: Abnormal   Collection Time: 08/07/15 11:41 PM  Result Value Ref Range   Glucose-Capillary 136 (H) 65 -  99 mg/dL  Glucose, capillary     Status: Abnormal   Collection Time: 08/08/15  4:23 AM  Result Value Ref Range   Glucose-Capillary 112 (H) 65 - 99 mg/dL   Comment 1 Notify RN   Triglycerides     Status: Abnormal   Collection Time: 08/08/15  5:40 AM  Result Value Ref Range   Triglycerides 2418 (H) <150 mg/dL  Glucose, capillary     Status: Abnormal   Collection Time: 08/08/15  7:29 AM  Result Value Ref Range   Glucose-Capillary 121 (H) 65 - 99 mg/dL   Comment 1 Notify RN   Glucose, capillary     Status: None   Collection Time: 08/08/15 10:59 AM  Result Value Ref Range   Glucose-Capillary 88 65 - 99 mg/dL   Comment 1 Notify RN    Lab Results  Component Value  Date   HGBA1C 11.7* 08/04/2015     Assessment Acute pancreatitis due to hypertriglyceridemia Familial hypertriglyceridemia Uncontrolled type 2 diabetes   Plan 1. Continue IV insulin and IV dextrose until serum Tg level <1000 2. Follow daily Tg level  3. Continue fibrate and fish oil. 4. Change diet to SUGAR FREE + specifically no ice cream. Counseled patient on how to make low sugar low fat dietary choices.  5. Encouraged him to start the application for the insulin pump. His lack of effort on this is discouraging and suggests that he is going to still be resistant to treatment as out-patient. 6. Anticipate transitioning to SQ insulin once his Tg level <1000. 7. As Endocrine is not available for consult over the upcoming weekend, I recommend that once Tg <1000, then start: Lantus 40 units daily NovoLog 12 units tid AC NovoLog intermediate SSI qACHS Stop IV insulin and IV dextrose 2 hours after the Lantus is given.  8. If patient is discharged before I can follow up with him on Monday 08/11/15, then I recommend he go home with prescriptions for: Lantus SoloStar pen - 40 units qhs #15 ml NovoLog Flex pen - 12 units tid AC  #15 ml Insulin pen needles for use 4 times daily #100 Gemfibrozil 600 mg bid  Plus instructions to take: fish oil 2g bid (over the counter)  9. If diet is modified again, always specific LOW CARB and SUGAR FREE. 10. Metformin will be started after hospital discharge. 11. Patient to see him in follow up 1-2 weeks after hospital discharge.  Time spent with patient and coordinating care was greater than 40 min.  I will return on Monday 08/11/15

## 2015-08-09 LAB — GLUCOSE, CAPILLARY
GLUCOSE-CAPILLARY: 100 mg/dL — AB (ref 65–99)
GLUCOSE-CAPILLARY: 196 mg/dL — AB (ref 65–99)
GLUCOSE-CAPILLARY: 240 mg/dL — AB (ref 65–99)
Glucose-Capillary: 104 mg/dL — ABNORMAL HIGH (ref 65–99)
Glucose-Capillary: 193 mg/dL — ABNORMAL HIGH (ref 65–99)
Glucose-Capillary: 224 mg/dL — ABNORMAL HIGH (ref 65–99)

## 2015-08-09 LAB — BASIC METABOLIC PANEL WITH GFR
BUN: <5 mg/dL — ABNORMAL LOW (ref 6–20)
CO2: 29 mmol/L (ref 22–32)
Calcium: 8.9 mg/dL (ref 8.9–10.3)
Chloride: 99 mmol/L — ABNORMAL LOW (ref 101–111)
Creatinine, Ser: 0.85 mg/dL (ref 0.61–1.24)
GFR calc Af Amer: >60 mL/min
GFR calc non Af Amer: >60 mL/min
Glucose, Bld: 129 mg/dL — ABNORMAL HIGH (ref 65–99)
Potassium: 3.5 mmol/L (ref 3.5–5.1)

## 2015-08-09 LAB — TRIGLYCERIDES: TRIGLYCERIDES: 2050 mg/dL — AB (ref <150)

## 2015-08-09 NOTE — Progress Notes (Signed)
Patient ID: Sergio [name], male   DOB: [birth year], 39 y.o.   MRN: [id number] [facility] PROGRESS NOTE  PCP: Crecencio Mc, MD  HPI/Subjective: Patient with very little abdominal pain at this point. He is asking to advance his diet. Offers no other complaints.  Objective: Filed Vitals:   08/09/15 0729  BP: 126/81  Pulse: 65  Temp:   Resp: 18    Filed Weights   08/01/15 2023 08/02/15 0100  Weight: 108.863 kg (240 lb) 107.1 kg (236 lb 1.8 oz)    ROS: Review of Systems  Constitutional: Negative for fever and chills.  Eyes: Negative for blurred vision.  Respiratory: Negative for cough and shortness of breath.   Cardiovascular: Negative for chest pain.  Gastrointestinal: Positive for abdominal pain. Negative for nausea, vomiting, diarrhea and constipation.  Genitourinary: Negative for dysuria.  Musculoskeletal: Negative for joint pain.  Neurological: Negative for dizziness and headaches.   Exam: Physical Exam  Constitutional: He is oriented to person, place, and time.  HENT:  Nose: No mucosal edema.  Mouth/Throat: No oropharyngeal exudate or posterior oropharyngeal edema.  Eyes: Conjunctivae, EOM and lids are normal. Pupils are equal, round, and reactive to light.  Neck: No JVD present. Carotid bruit is not present. No edema present. No thyroid mass and no thyromegaly present.  Cardiovascular: S1 normal and S2 normal.  Exam reveals no gallop.   No murmur heard. Pulses:      Dorsalis pedis pulses are 2+ on the right side, and 2+ on the left side.  Respiratory: No respiratory distress. He has no wheezes. He has no rhonchi. He has no rales.  GI: Soft. Bowel sounds are normal. There is no tenderness.  Musculoskeletal:       Right ankle: He exhibits no swelling.       Left ankle: He exhibits no swelling.  Lymphadenopathy:    He has no cervical adenopathy.  Neurological: He is alert and oriented to person, place, and time. No cranial nerve deficit.  Skin:  Skin is warm. No rash noted. Nails show no clubbing.  Psychiatric: He has a normal mood and affect.   Data Reviewed: Basic Metabolic Panel:  Recent Labs Lab 08/04/15 0548 08/05/15 1126 08/05/15 1309 08/06/15 0424 08/08/15 0540 08/09/15 0341  NA 126* 127*  --  130* UNABLE TO PERFORM DUE TO LIPEMIC INTERFERENCE SEE COMMENTS  K 3.8 6.8* 5.7* 3.6 3.1* 3.5  CL 91* 91*  --  91* 95* 99*  CO2 29 32  --  32 27 29  GLUCOSE 169* 101*  --  126* 102* 129*  BUN <5* <5*  --  <5* <5* <5*  CREATININE 0.55* 1.27*  --  0.92 0.82 0.85  CALCIUM 8.4* 8.5*  --  8.3* 8.5* 8.9  MG 3.5*  --   --   --   --   --    CBC:  Recent Labs Lab 08/03/15 0515 08/06/15 0424 08/07/15 0420  WBC 5.9  --  3.9  HGB 13.3  --  13.6  HCT 36.4*  --  37.1*  MCV 81.0  --  82.2  PLT 211 279 267    CBG:  Recent Labs Lab 08/08/15 1617 08/08/15 2029 08/08/15 2358 08/09/15 0513 08/09/15 0731  GLUCAP 226* 198* 156* 104* 100*    Recent Results (from the past 240 hour(s))  MRSA PCR Screening     Status: None   Collection Time: 08/04/15  6:39 PM  Result Value Ref Range Status   MRSA  by PCR NEGATIVE NEGATIVE Final    Comment:        The GeneXpert MRSA Assay (FDA approved for NASAL specimens only), is one component of a comprehensive MRSA colonization surveillance program. It is not intended to diagnose MRSA infection nor to guide or monitor treatment for MRSA infections.       Scheduled Meds: . aspirin EC  81 mg Oral Daily  . gemfibrozil  600 mg Oral BID AC  . heparin  5,000 Units Subcutaneous 3 times per day  . omega-3 acid ethyl esters  1 g Oral BID  . pantoprazole  40 mg Oral Daily  . potassium chloride  20 mEq Oral BID  . sertraline  50 mg Oral Daily   Continuous Infusions: . dextrose 150 mL/hr at 08/09/15 0825  . insulin (NOVOLIN-R) infusion 10.8 mL/hr at 08/08/15 1300    Assessment/Plan:  1. Acute pancreatitis secondary to severe hypertriglyceridemia. As per Dr. Gabriel Carina endocrinology,  to continue insulin drip until triglyceride level is under 1000. Since patient is having less abdominal pain, I will advance the patient's diet. Patient will need to continue gemfibrozil and omega-3 fatty acids. Patient likely home once triglyceride level under thousand. 2. Type 2 diabetes mellitus- patient is looking towards getting an insulin pump. Currently on insulin drip. 3. Obesity and sleep apnea- patient had a gastric sleeve procedure. CPAP at night. 4. Depression- on Zoloft  Code Status:     Code Status Orders        Start     Ordered   08/01/15 2335  Full code   Continuous     08/01/15 2335     Disposition Plan: Home once triglyceride is under thousand  Time spent: 20 minutes  Loletha Grayer  [facility]

## 2015-08-09 NOTE — Progress Notes (Signed)
Pt states that he doesn't need assistance with his home CPAP machine

## 2015-08-10 LAB — GLUCOSE, CAPILLARY
GLUCOSE-CAPILLARY: 239 mg/dL — AB (ref 65–99)
GLUCOSE-CAPILLARY: 179 mg/dL — AB (ref 65–99)
GLUCOSE-CAPILLARY: 187 mg/dL — AB (ref 65–99)
Glucose-Capillary: 147 mg/dL — ABNORMAL HIGH (ref 65–99)
Glucose-Capillary: 157 mg/dL — ABNORMAL HIGH (ref 65–99)

## 2015-08-10 LAB — BASIC METABOLIC PANEL
ANION GAP: UNDETERMINED (ref 5–15)
BUN: <5 mg/dL — ABNORMAL LOW (ref 6–20)
Chloride: 100 mmol/L — ABNORMAL LOW (ref 101–111)
Creatinine, Ser: 0.84 mg/dL (ref 0.61–1.24)
GFR calc Af Amer: >60 mL/min (ref >60)
GFR calc non Af Amer: >60 mL/min (ref >60)

## 2015-08-10 LAB — TRIGLYCERIDES: Triglycerides: 3408 mg/dL — ABNORMAL HIGH (ref <150)

## 2015-08-10 NOTE — Progress Notes (Signed)
Iv insulin drip to reduce triglycerides. Decreased D10 from 150 to 100cc/hr. Per Jannifer Franklin MD due to rising FSBS and patient eating more solids . Taking iv dilaudid for abdominal pain. Per regularly. uses urinal to void. Labs improving.

## 2015-08-10 NOTE — Progress Notes (Signed)
Patient ID: Sergio [name], male   DOB: Jul 08, 1976, 39 y.o.   MRN: [id number] [facility] PROGRESS NOTE  PCP: Crecencio Mc, MD  HPI/Subjective: Patient with very little abdominal pain at this point. Very concerned that his triglycerides went up today.  Objective: Filed Vitals:   08/10/15 0731  BP: 125/75  Pulse: 72  Temp: 97.5 F (36.4 C)  Resp: 18    Filed Weights   08/01/15 2023 08/02/15 0100  Weight: 108.863 kg (240 lb) 107.1 kg (236 lb 1.8 oz)    ROS: Review of Systems  Constitutional: Negative for fever and chills.  Eyes: Negative for blurred vision.  Respiratory: Negative for cough and shortness of breath.   Cardiovascular: Negative for chest pain.  Gastrointestinal: Positive for abdominal pain. Negative for nausea, vomiting, diarrhea and constipation.  Genitourinary: Negative for dysuria.  Musculoskeletal: Negative for joint pain.  Neurological: Negative for dizziness and headaches.   Exam: Physical Exam  Constitutional: He is oriented to person, place, and time.  HENT:  Nose: No mucosal edema.  Mouth/Throat: No oropharyngeal exudate or posterior oropharyngeal edema.  Eyes: Conjunctivae, EOM and lids are normal. Pupils are equal, round, and reactive to light.  Neck: No JVD present. Carotid bruit is not present. No edema present. No thyroid mass and no thyromegaly present.  Cardiovascular: S1 normal and S2 normal.  Exam reveals no gallop.   No murmur heard. Pulses:      Dorsalis pedis pulses are 2+ on the right side, and 2+ on the left side.  Respiratory: No respiratory distress. He has no wheezes. He has no rhonchi. He has no rales.  GI: Soft. Bowel sounds are normal. There is no tenderness.  Musculoskeletal:       Right ankle: He exhibits no swelling.       Left ankle: He exhibits no swelling.  Lymphadenopathy:    He has no cervical adenopathy.  Neurological: He is alert and oriented to person, place, and time. No cranial nerve deficit.   Skin: Skin is warm. No rash noted. Nails show no clubbing.  Psychiatric: He has a normal mood and affect.   Data Reviewed: Basic Metabolic Panel:  Recent Labs Lab 08/04/15 0548 08/05/15 1126 08/05/15 1309 08/06/15 0424 08/08/15 0540 08/09/15 0341 08/10/15 0331  NA 126* 127*  --  130* UNABLE TO PERFORM DUE TO LIPEMIC INTERFERENCE SEE COMMENTS SEE COMMENTS  K 3.8 6.8* 5.7* 3.6 3.1* 3.5 SEE COMMENTS  CL 91* 91*  --  91* 95* 99* 100*  CO2 29 32  --  32 27 29 SEE COMMENTS  GLUCOSE 169* 101*  --  126* 102* 129* SEE COMMENTS  BUN <5* <5*  --  <5* <5* <5* <5*  CREATININE 0.55* 1.27*  --  0.92 0.82 0.85 0.84  CALCIUM 8.4* 8.5*  --  8.3* 8.5* 8.9 SEE COMMENTS  MG 3.5*  --   --   --   --   --   --    CBC:  Recent Labs Lab 08/06/15 0424 08/07/15 0420  WBC  --  3.9  HGB  --  13.6  HCT  --  37.1*  MCV  --  82.2  PLT 279 267    CBG:  Recent Labs Lab 08/09/15 1945 08/09/15 2341 08/10/15 0416 08/10/15 0733 08/10/15 1123  GLUCAP 240* 224* 239* 179* 147*    Recent Results (from the past 240 hour(s))  MRSA PCR Screening     Status: None   Collection Time: 08/04/15  6:39 PM  Result Value Ref Range Status   MRSA by PCR NEGATIVE NEGATIVE Final    Comment:        The GeneXpert MRSA Assay (FDA approved for NASAL specimens only), is one component of a comprehensive MRSA colonization surveillance program. It is not intended to diagnose MRSA infection nor to guide or monitor treatment for MRSA infections.       Scheduled Meds: . aspirin EC  81 mg Oral Daily  . gemfibrozil  600 mg Oral BID AC  . heparin  5,000 Units Subcutaneous 3 times per day  . omega-3 acid ethyl esters  1 g Oral BID  . pantoprazole  40 mg Oral Daily  . potassium chloride  20 mEq Oral BID  . sertraline  50 mg Oral Daily   Continuous Infusions: . dextrose 100 mL/hr at 08/10/15 0849  . insulin (NOVOLIN-R) infusion 10.8 mL/hr at 08/08/15 1300    Assessment/Plan:  1. Acute pancreatitis  secondary to severe hypertriglyceridemia. As per Dr. Gabriel Carina endocrinology, to continue insulin drip until triglyceride level is under 1000. Triglycerides went up to 3408 today. Place back on full liquid diet. Continue gemfibrozil and omega-3 fatty acid. Check triglyceride and lipase in the morning. 2. Type 2 diabetes mellitus- patient is looking towards getting an insulin pump. Currently on insulin drip. 3. Obesity and sleep apnea- patient had a gastric sleeve procedure. CPAP at night. 4. Depression- on Zoloft  Code Status:     Code Status Orders        Start     Ordered   08/01/15 2335  Full code   Continuous     08/01/15 2335     Disposition Plan: Home once triglyceride is under one thousand  Time spent: 20 minutes  Loletha Grayer  [facility]

## 2015-08-10 NOTE — Progress Notes (Signed)
Dr. Earleen Newport put pt on bariatric full liquid diet. Patient asked if he could be on a regular liquid diet. Spoke to Dr. Earleen Newport & he agreed to patient request. Changed to full liquid diet.

## 2015-08-11 LAB — GLUCOSE, CAPILLARY
GLUCOSE-CAPILLARY: 111 mg/dL — AB (ref 65–99)
GLUCOSE-CAPILLARY: 117 mg/dL — AB (ref 65–99)
GLUCOSE-CAPILLARY: 129 mg/dL — AB (ref 65–99)
Glucose-Capillary: 121 mg/dL — ABNORMAL HIGH (ref 65–99)
Glucose-Capillary: 110 mg/dL — ABNORMAL HIGH (ref 65–99)
Glucose-Capillary: 98 mg/dL (ref 65–99)

## 2015-08-11 LAB — BASIC METABOLIC PANEL
Anion gap: 7 (ref 5–15)
BUN: 5 mg/dL — AB (ref 6–20)
CHLORIDE: 100 mmol/L — AB (ref 101–111)
CO2: 27 mmol/L (ref 22–32)
CREATININE: 0.79 mg/dL (ref 0.61–1.24)
Calcium: 9 mg/dL (ref 8.9–10.3)
GFR calc Af Amer: >60 mL/min (ref >60)
GFR calc non Af Amer: >60 mL/min (ref >60)
GLUCOSE: 117 mg/dL — AB (ref 65–99)
Potassium: 4.5 mmol/L (ref 3.5–5.1)
SODIUM: 134 mmol/L — AB (ref 135–145)

## 2015-08-11 LAB — TRIGLYCERIDES: Triglycerides: 3289 mg/dL — ABNORMAL HIGH (ref <150)

## 2015-08-11 LAB — LIPASE, BLOOD: LIPASE: 20 U/L (ref 11–51)

## 2015-08-11 MED ORDER — OMEGA-3-ACID ETHYL ESTERS 1 G PO CAPS
2.0000 g | ORAL_CAPSULE | Freq: Two times a day (BID) | ORAL | Status: DC
  Administered 2015-08-11 – 2015-08-15 (×8): 2 g via ORAL
  Filled 2015-08-11 (×8): qty 2

## 2015-08-11 MED ORDER — ROSUVASTATIN CALCIUM 10 MG PO TABS
10.0000 mg | ORAL_TABLET | Freq: Every day | ORAL | Status: DC
  Administered 2015-08-11 – 2015-08-14 (×4): 10 mg via ORAL
  Filled 2015-08-11 (×4): qty 1

## 2015-08-11 NOTE — Progress Notes (Signed)
Patient ID: Sergio [name], male   DOB: 03-Oct-1976, 39 y.o.   MRN: [id number] [facility] PROGRESS NOTE  PCP: Crecencio Mc, MD  HPI/Subjective: Patient still with abdominal pain.  Concerned that Lipase is still elevated. No nausea  Objective: Filed Vitals:   08/11/15 0729  BP: 116/77  Pulse: 69  Temp:   Resp: 16    Filed Weights   08/01/15 2023 08/02/15 0100  Weight: 108.863 kg (240 lb) 107.1 kg (236 lb 1.8 oz)    ROS: Review of Systems  Constitutional: Negative for fever and chills.  Eyes: Negative for blurred vision.  Respiratory: Negative for cough and shortness of breath.   Cardiovascular: Negative for chest pain.  Gastrointestinal: Positive for abdominal pain. Negative for nausea, vomiting, diarrhea and constipation.  Genitourinary: Negative for dysuria.  Musculoskeletal: Negative for joint pain.  Neurological: Negative for dizziness and headaches.   Exam: Physical Exam  Constitutional: He is oriented to person, place, and time.  HENT:  Nose: No mucosal edema.  Mouth/Throat: No oropharyngeal exudate or posterior oropharyngeal edema.  Eyes: Conjunctivae, EOM and lids are normal. Pupils are equal, round, and reactive to light.  Neck: No JVD present. Carotid bruit is not present. No edema present. No thyroid mass and no thyromegaly present.  Cardiovascular: S1 normal and S2 normal.  Exam reveals no gallop.   No murmur heard. Pulses:      Dorsalis pedis pulses are 2+ on the right side, and 2+ on the left side.  Respiratory: No respiratory distress. He has no wheezes. He has no rhonchi. He has no rales.  GI: Soft. Bowel sounds are normal. There is tenderness in the epigastric area.  Musculoskeletal:       Right ankle: He exhibits no swelling.       Left ankle: He exhibits no swelling.  Lymphadenopathy:    He has no cervical adenopathy.  Neurological: He is alert and oriented to person, place, and time. No cranial nerve deficit.  Skin: Skin is warm.  No rash noted. Nails show no clubbing.  Psychiatric: He has a normal mood and affect.   Data Reviewed: Basic Metabolic Panel:  Recent Labs Lab 08/06/15 0424 08/08/15 0540 08/09/15 0341 08/10/15 0331 08/11/15 0342  NA 130* UNABLE TO PERFORM DUE TO LIPEMIC INTERFERENCE SEE COMMENTS SEE COMMENTS 134*  K 3.6 3.1* 3.5 SEE COMMENTS 4.5  CL 91* 95* 99* 100* 100*  CO2 32 27 29 SEE COMMENTS 27  GLUCOSE 126* 102* 129* SEE COMMENTS 117*  BUN <5* <5* <5* <5* 5*  CREATININE 0.92 0.82 0.85 0.84 0.79  CALCIUM 8.3* 8.5* 8.9 SEE COMMENTS 9.0   CBC:  Recent Labs Lab 08/06/15 0424 08/07/15 0420  WBC  --  3.9  HGB  --  13.6  HCT  --  37.1*  MCV  --  82.2  PLT 279 267    CBG:  Recent Labs Lab 08/10/15 1936 08/11/15 0003 08/11/15 0422 08/11/15 0727 08/11/15 1147  GLUCAP 157* 129* 121* 117* 110*    Recent Results (from the past 240 hour(s))  MRSA PCR Screening     Status: None   Collection Time: 08/04/15  6:39 PM  Result Value Ref Range Status   MRSA by PCR NEGATIVE NEGATIVE Final    Comment:        The GeneXpert MRSA Assay (FDA approved for NASAL specimens only), is one component of a comprehensive MRSA colonization surveillance program. It is not intended to diagnose MRSA infection nor to guide  or monitor treatment for MRSA infections.       Scheduled Meds: . aspirin EC  81 mg Oral Daily  . gemfibrozil  600 mg Oral BID AC  . heparin  5,000 Units Subcutaneous 3 times per day  . omega-3 acid ethyl esters  1 g Oral BID  . pantoprazole  40 mg Oral Daily  . potassium chloride  20 mEq Oral BID  . sertraline  50 mg Oral Daily   Continuous Infusions: . dextrose 100 mL/hr at 08/11/15 0608  . insulin (NOVOLIN-R) infusion 10.8 Units/hr (08/10/15 1500)    Assessment/Plan:  1. Acute pancreatitis secondary to severe hypertriglyceridemia. Continue gemfibrozil and omega-3 fatty acid. Check triglyceride daily. Elevation of triglyceride could be secondary to advancing diet  too soon.  Cut back to clear liquid diet with no sweets. On IV insulin drip and ivf hydration. Lipase normalized.  Case discussed with [name] endocrinology. 2. Type 2 diabetes mellitus- patient is looking towards getting an insulin pump. Currently on insulin drip. 3. Obesity and sleep apnea- patient had a gastric sleeve procedure. CPAP at night. 4. Depression- on Zoloft  Code Status:     Code Status Orders        Start     Ordered   08/01/15 2335  Full code   Continuous     08/01/15 2335     Disposition Plan: Home once triglyceride is under one thousand  Time spent: 20 minutes  Loletha Grayer  [facility]

## 2015-08-11 NOTE — Progress Notes (Signed)
Endocrinology follow up   39 y.o. male seen in follow up for hypertriglyceridemia induced acute pancreatitis. Still maintained on IV insulin at 10.8 units/hr and IV D5 at 100 cc/hr. Oral medications include gemfibrozil and fish oil 1 g BID. Diet was advanced on Saturday. Tg increased from 2050 to 3408 from Sat morning to Sunday morning. Diet then put back to clears Sunday and Tg level today at 3289.He continues to report abd pain. Tolerating liquid diet. He was advanced to full liquids today and lunch provided includes regular ice cream and regular pudding.  He has completed the application for the insulin pump.  Medical history Past Medical History  Diagnosis Date  . Recurrent acute pancreatitis     secondary to hypertriglyceridemia   . Gastric ulcer 2009  . Sleep apnea 1999    uses CPAP  . Chicken pox   . Diabetes mellitus   . HTN (hypertension)   . Familial hypertriglyceridemia     severe  . Morbid obesity [facility])     s/p bariatric sleeve surgery 01/2015    Surgical history Past Surgical History  Procedure Laterality Date  . Tonsillectomy  2003  . Vasectomy    . Lumbar disc surgery  2008  . Cholecystectomy    . Laparoscopic gastric sleeve resection       Medications . aspirin EC  81 mg Oral Daily  . gemfibrozil  600 mg Oral BID AC  . heparin  5,000 Units Subcutaneous 3 times per day  . omega-3 acid ethyl esters  2 g Oral BID  . pantoprazole  40 mg Oral Daily  . potassium chloride  20 mEq Oral BID  . rosuvastatin  10 mg Oral q1800  . sertraline  50 mg Oral Daily  IV insulin IV dextrose   Social history  Married. Employed. Social History  Substance Use Topics  . Smoking status: Never Smoker   . Smokeless tobacco: Never Used  . Alcohol Use: No     Comment: once month     Family history Family History  Problem Relation Age of Onset  . Adopted: Yes  . Hypertension Mother   .  Hyperlipidemia Mother   . Hypertension Father   . Hyperlipidemia Father      Review of systems CV: no chest pain or palpitations PULM: no cough or shortness of breath GEN: No fevers or chills  Physical Exam BP 116/77 mmHg  Pulse 69  Temp(Src) 97.8 F (36.6 C) (Axillary)  Resp 16  Ht 6' (1.829 m)  Wt 107.1 kg (236 lb 1.8 oz)  BMI 32.02 kg/m2  SpO2 96%  GEN: Obese white  male, in NAD. HEENT: No proptosis, EOMI, lid lag or stare. Oropharynx is clear.  NECK: supple, trachea midline.   RESPIRATORY: clear bilaterally, no wheeze, good inspiratory effort. CV: No carotid bruits, RRR. MUSCULOSKELETAL: normal gait and station, no clubbing, no tremor ABD: soft, +diffuse non focal TTP, no guarding EXT: no peripheral edema SKIN: few xanthomas scattered on flexor surfaces of upper extremities LYMPH: no submandibular or supraclavicular LAD NEURO: PERRL, no dysarthria PSYC: alert and oriented, good insight  Labs Results for orders placed or performed during the hospital encounter of 08/01/15 (from the past 24 hour(s))  Glucose, capillary     Status: Abnormal   Collection Time: 08/10/15  4:22 PM  Result Value Ref Range   Glucose-Capillary 187 (H) 65 - 99 mg/dL   Comment 1 Notify RN   Glucose, capillary     Status: Abnormal  Collection Time: 08/10/15  7:36 PM  Result Value Ref Range   Glucose-Capillary 157 (H) 65 - 99 mg/dL  Glucose, capillary     Status: Abnormal   Collection Time: 08/11/15 12:03 AM  Result Value Ref Range   Glucose-Capillary 129 (H) 65 - 99 mg/dL  Triglycerides     Status: Abnormal   Collection Time: 08/11/15  3:42 AM  Result Value Ref Range   Triglycerides 3289 (H) <150 mg/dL  Lipase, blood     Status: None   Collection Time: 08/11/15  3:42 AM  Result Value Ref Range   Lipase 20 11 - 51 U/L  Basic metabolic panel     Status: Abnormal   Collection Time: 08/11/15  3:42 AM  Result Value Ref Range   Sodium 134 (L) 135 - 145 mmol/L   Potassium 4.5 3.5 - 5.1  mmol/L   Chloride 100 (L) 101 - 111 mmol/L   CO2 27 22 - 32 mmol/L   Glucose, Bld 117 (H) 65 - 99 mg/dL   BUN 5 (L) 6 - 20 mg/dL   Creatinine, Ser 0.79 0.61 - 1.24 mg/dL   Calcium 9.0 8.9 - 10.3 mg/dL   GFR calc non Af Amer >60 >60 mL/min   GFR calc Af Amer >60 >60 mL/min   Anion gap 7 5 - 15  Glucose, capillary     Status: Abnormal   Collection Time: 08/11/15  4:22 AM  Result Value Ref Range   Glucose-Capillary 121 (H) 65 - 99 mg/dL  Glucose, capillary     Status: Abnormal   Collection Time: 08/11/15  7:27 AM  Result Value Ref Range   Glucose-Capillary 117 (H) 65 - 99 mg/dL   Comment 1 Notify RN   Glucose, capillary     Status: Abnormal   Collection Time: 08/11/15 11:47 AM  Result Value Ref Range   Glucose-Capillary 110 (H) 65 - 99 mg/dL   Comment 1 Notify RN    Lab Results  Component Value Date   HGBA1C 11.7* 08/04/2015     Assessment Acute pancreatitis due to hypertriglyceridemia Familial hypertriglyceridemia Uncontrolled type 2 diabetes   Plan 1. Continue IV insulin and IV dextrose until serum Tg level <1000 2. Follow daily Tg level  3. Continue fibrate and fish oil. Increase fish oil to 2 G bid. 4. Add Crestor 10 mg daily. 5. Continue only liquid diet. He was given a regular soda and pudding today. Will clarify to dietary that only sugar free items are acceptable. 6. Will fax application off for insulin pump.    I will follow along with you.

## 2015-08-11 NOTE — Progress Notes (Signed)
Continued mid upper abdominal pain relieved with dilaudid. Triglyceride level trending slowly. Diet reduced to clear liquids

## 2015-08-11 NOTE — Progress Notes (Signed)
Pharmacy Note - Drug-Drug interaction  Discussed drug interaction with rosuvastatin and gemfibrozil with Dr. Gabriel Carina. Patient ordered gemfibrozil 600mg  PO BID, new orders for rosuvastatin 10mg  PO QHS.  Recommendations: When possible, avoid concurrent use of gemfibrozil or consider using micronized fenofibrate in patients receiving rosuvastatin. For patients in whom concomitant use of gemfibrozil can not be avoided, limit rosuvastatin to 10 mg/day ([place] labeling recommendation) Monitor closely for signs and symptoms of rhabdomyolysis with concomitant use.  Per Dr. Gabriel Carina, patient has been on this combination in the past. Will continue current orders and monitor for signs of adverse effects, eg myopathies, etc.  Rexene Edison, PharmD Clinical Pharmacist  08/11/2015 2:12 PM

## 2015-08-12 LAB — CBC WITH DIFFERENTIAL/PLATELET
BASOS PCT: 1 %
Basophils Absolute: 0.1 10*3/uL (ref 0–0.1)
Eosinophils Absolute: 0.2 10*3/uL (ref 0–0.7)
Eosinophils Relative: 5 %
HEMATOCRIT: 39.9 % — AB (ref 40.0–52.0)
HEMOGLOBIN: 13.5 g/dL (ref 13.0–18.0)
LYMPHS ABS: 1.8 10*3/uL (ref 1.0–3.6)
LYMPHS PCT: 46 %
MCH: 27.7 pg (ref 26.0–34.0)
MCHC: 33.8 g/dL (ref 32.0–36.0)
MCV: 81.8 fL (ref 80.0–100.0)
MONO ABS: 0.4 10*3/uL (ref 0.2–1.0)
MONOS PCT: 11 %
NEUTROS ABS: 1.4 10*3/uL (ref 1.4–6.5)
NEUTROS PCT: 37 %
Platelets: 151 10*3/uL (ref 150–440)
RBC: 4.87 MIL/uL (ref 4.40–5.90)
RDW: 15.7 % — AB (ref 11.5–14.5)
WBC: 3.8 10*3/uL (ref 3.8–10.6)

## 2015-08-12 LAB — GLUCOSE, CAPILLARY
GLUCOSE-CAPILLARY: 107 mg/dL — AB (ref 65–99)
GLUCOSE-CAPILLARY: 113 mg/dL — AB (ref 65–99)
GLUCOSE-CAPILLARY: 77 mg/dL (ref 65–99)
GLUCOSE-CAPILLARY: 96 mg/dL (ref 65–99)
Glucose-Capillary: 83 mg/dL (ref 65–99)
Glucose-Capillary: 92 mg/dL (ref 65–99)
Glucose-Capillary: 121 mg/dL — ABNORMAL HIGH (ref 65–99)

## 2015-08-12 LAB — BASIC METABOLIC PANEL
BUN: 6 mg/dL (ref 6–20)
CHLORIDE: 99 mmol/L — AB (ref 101–111)
CO2: 27 mmol/L (ref 22–32)
CREATININE: 0.86 mg/dL (ref 0.61–1.24)
Calcium: 8.9 mg/dL (ref 8.9–10.3)
GFR calc Af Amer: >60 mL/min (ref >60)
GFR calc non Af Amer: >60 mL/min (ref >60)
Glucose, Bld: 105 mg/dL — ABNORMAL HIGH (ref 65–99)
POTASSIUM: 3.9 mmol/L (ref 3.5–5.1)
SODIUM: UNDETERMINED mmol/L (ref 135–145)

## 2015-08-12 LAB — TRIGLYCERIDES: Triglycerides: 2189 mg/dL — ABNORMAL HIGH (ref <150)

## 2015-08-12 MED ORDER — DEXTROSE 50 % IV SOLN
12.5000 g | Freq: Once | INTRAVENOUS | Status: AC
  Administered 2015-08-12: 12.5 g via INTRAVENOUS
  Filled 2015-08-12: qty 50

## 2015-08-12 NOTE — Progress Notes (Signed)
Patient blood sugar trending down.Nursing interventions taking with slight increase. Dr. Jannifer Franklin notified new orders giving.

## 2015-08-12 NOTE — Progress Notes (Signed)
Patient ID: Sergio [name], male   DOB: 03/22/1976, 39 y.o.   MRN: [id number] [facility] PROGRESS NOTE  PCP: Crecencio Mc, MD  HPI/Subjective: Patient still with abdominal pain, pain medications help. At the most his pain is 6-7 out of 10 intensity but relieved with pain medication.  Tolerating liquid diet.  Objective: Filed Vitals:   08/12/15 0751  BP: 111/82  Pulse: 73  Temp: 96.8 F (36 C)  Resp: 17    Filed Weights   08/01/15 2023 08/02/15 0100  Weight: 108.863 kg (240 lb) 107.1 kg (236 lb 1.8 oz)    ROS: Review of Systems  Constitutional: Negative for fever and chills.  Eyes: Negative for blurred vision.  Respiratory: Negative for cough and shortness of breath.   Cardiovascular: Negative for chest pain.  Gastrointestinal: Positive for abdominal pain. Negative for nausea, vomiting, diarrhea and constipation.  Genitourinary: Negative for dysuria.  Musculoskeletal: Negative for joint pain.  Neurological: Negative for dizziness and headaches.   Exam: Physical Exam  Constitutional: He is oriented to person, place, and time.  HENT:  Nose: No mucosal edema.  Mouth/Throat: No oropharyngeal exudate or posterior oropharyngeal edema.  Eyes: Conjunctivae, EOM and lids are normal. Pupils are equal, round, and reactive to light.  Neck: No JVD present. Carotid bruit is not present. No edema present. No thyroid mass and no thyromegaly present.  Cardiovascular: S1 normal and S2 normal.  Exam reveals no gallop.   No murmur heard. Pulses:      Dorsalis pedis pulses are 2+ on the right side, and 2+ on the left side.  Respiratory: No respiratory distress. He has no wheezes. He has no rhonchi. He has no rales.  GI: Soft. Bowel sounds are normal. There is tenderness in the epigastric area.  Musculoskeletal:       Right ankle: He exhibits no swelling.       Left ankle: He exhibits no swelling.  Lymphadenopathy:    He has no cervical adenopathy.  Neurological: He is  alert and oriented to person, place, and time. No cranial nerve deficit.  Skin: Skin is warm. No rash noted. Nails show no clubbing.  Psychiatric: He has a normal mood and affect.   Data Reviewed: Basic Metabolic Panel:  Recent Labs Lab 08/08/15 0540 08/09/15 0341 08/10/15 0331 08/11/15 0342 08/12/15 0600  NA UNABLE TO PERFORM DUE TO LIPEMIC INTERFERENCE SEE COMMENTS SEE COMMENTS 134* UNABLE TO REPORT DUE TO LIPEMIC INTERFERENCE  K 3.1* 3.5 SEE COMMENTS 4.5 3.9  CL 95* 99* 100* 100* 99*  CO2 27 29 SEE COMMENTS 27 27  GLUCOSE 102* 129* SEE COMMENTS 117* 105*  BUN <5* <5* <5* 5* 6  CREATININE 0.82 0.85 0.84 0.79 0.86  CALCIUM 8.5* 8.9 SEE COMMENTS 9.0 8.9   CBC:  Recent Labs Lab 08/06/15 0424 08/07/15 0420 08/12/15 0600  WBC  --  3.9 3.8  NEUTROABS  --   --  1.4  HGB  --  13.6 13.5  HCT  --  37.1* 39.9*  MCV  --  82.2 81.8  PLT 279 267 151    CBG:  Recent Labs Lab 08/12/15 0015 08/12/15 0109 08/12/15 0401 08/12/15 0750 08/12/15 1159  GLUCAP 77 83 113* 107* 92    Recent Results (from the past 240 hour(s))  MRSA PCR Screening     Status: None   Collection Time: 08/04/15  6:39 PM  Result Value Ref Range Status   MRSA by PCR NEGATIVE NEGATIVE Final  Comment:        The GeneXpert MRSA Assay (FDA approved for NASAL specimens only), is one component of a comprehensive MRSA colonization surveillance program. It is not intended to diagnose MRSA infection nor to guide or monitor treatment for MRSA infections.       Scheduled Meds: . aspirin EC  81 mg Oral Daily  . gemfibrozil  600 mg Oral BID AC  . heparin  5,000 Units Subcutaneous 3 times per day  . omega-3 acid ethyl esters  2 g Oral BID  . pantoprazole  40 mg Oral Daily  . potassium chloride  20 mEq Oral BID  . rosuvastatin  10 mg Oral q1800  . sertraline  50 mg Oral Daily   Continuous Infusions: . dextrose 125 mL/hr at 08/12/15 1249  . insulin (NOVOLIN-R) infusion 10.8 Units/hr (08/11/15  1409)    Assessment/Plan:  1. Acute pancreatitis secondary to severe hypertriglyceridemia. Continue gemfibrozil and omega-3 fatty acid. Check triglyceride daily. On IV insulin drip and ivf hydration. Lipase normalized. Triglycerides down to 2189. Patient will be here in the hospital until triglycerides less than 1000. 2. Type 2 diabetes mellitus- Currently on insulin drip. 3. Obesity and sleep apnea- patient had a gastric sleeve procedure. CPAP at night. 4. Depression- on Zoloft  Code Status:     Code Status Orders        Start     Ordered   08/01/15 2335  Full code   Continuous     08/01/15 2335     Disposition Plan: Home once triglyceride is under one thousand  Time spent: 20 minutes  Loletha Grayer  [facility]

## 2015-08-12 NOTE — Care Management (Signed)
Still on liquid diet. TG Results for EINO, WHITNER (MRN [id number]) as of 08/12/2015 10:26  Ref. Range 08/12/2015 06:00  Triglycerides Latest Ref Range: <150 mg/dL 2189 (H)  With goal of <1,000 per endocrinology.

## 2015-08-12 NOTE — Progress Notes (Signed)
Endocrinology follow up   39 y.o. male seen in follow up for hypertriglyceridemia induced acute pancreatitis. Still maintained on IV insulin at 10.8 units/hr and IV D5 at 100 cc/hr. Blood sugars have been in the range of 77 - 113 over last 24 hrs. Oral medications include gemfibrozil 600 mg bid, rosuvastatin 10 mg daily, and fish oil 2 g BID. Tg decreased from 3289 yesterday to 2189 today. He continues to report abd pain. Tolerating liquid diet.   Medical history Past Medical History  Diagnosis Date  . Recurrent acute pancreatitis     secondary to hypertriglyceridemia   . Gastric ulcer 2009  . Sleep apnea 1999    uses CPAP  . Chicken pox   . Diabetes mellitus   . HTN (hypertension)   . Familial hypertriglyceridemia     severe  . Morbid obesity [facility])     s/p bariatric sleeve surgery 01/2015    Surgical history Past Surgical History  Procedure Laterality Date  . Tonsillectomy  2003  . Vasectomy    . Lumbar disc surgery  2008  . Cholecystectomy    . Laparoscopic gastric sleeve resection       Medications . aspirin EC  81 mg Oral Daily  . gemfibrozil  600 mg Oral BID AC  . heparin  5,000 Units Subcutaneous 3 times per day  . omega-3 acid ethyl esters  2 g Oral BID  . pantoprazole  40 mg Oral Daily  . potassium chloride  20 mEq Oral BID  . rosuvastatin  10 mg Oral q1800  . sertraline  50 mg Oral Daily  IV insulin IV dextrose   Social history  Married. Employed. Social History  Substance Use Topics  . Smoking status: Never Smoker   . Smokeless tobacco: Never Used  . Alcohol Use: No     Comment: once month     Family history Family History  Problem Relation Age of Onset  . Adopted: Yes  . Hypertension Mother   . Hyperlipidemia Mother   . Hypertension Father   . Hyperlipidemia Father      Review of systems CV: no chest pain or palpitations PULM: no cough or shortness of  breath GEN: No fevers or chills  Physical Exam BP 111/82 mmHg  Pulse 73  Temp(Src) 96.8 F (36 C) (Axillary)  Resp 17  Ht 6' (1.829 m)  Wt 107.1 kg (236 lb 1.8 oz)  BMI 32.02 kg/m2  SpO2 97%  GEN: Obese white  male, in NAD. HEENT: No proptosis, EOMI, lid lag or stare. Oropharynx is clear.  NECK: supple, trachea midline.   RESPIRATORY: clear bilaterally, no wheeze, good inspiratory effort. CV: No carotid bruits, RRR. ABD: soft, +diffuse non focal TTP, no guarding EXT: no peripheral edema SKIN: few xanthomas scattered on flexor surfaces of upper extremities LYMPH: no submandibular or supraclavicular LAD NEURO: PERRL, no dysarthria PSYC: alert and oriented, good insight  Labs Results for orders placed or performed during the hospital encounter of 08/01/15 (from the past 24 hour(s))  Glucose, capillary     Status: Abnormal   Collection Time: 08/11/15  3:54 PM  Result Value Ref Range   Glucose-Capillary 111 (H) 65 - 99 mg/dL   Comment 1 Notify RN   Glucose, capillary     Status: None   Collection Time: 08/11/15  8:13 PM  Result Value Ref Range   Glucose-Capillary 98 65 - 99 mg/dL   Comment 1 Notify RN   Glucose, capillary     Status:  None   Collection Time: 08/12/15 12:15 AM  Result Value Ref Range   Glucose-Capillary 77 65 - 99 mg/dL   Comment 1 Notify RN   Glucose, capillary     Status: None   Collection Time: 08/12/15  1:09 AM  Result Value Ref Range   Glucose-Capillary 83 65 - 99 mg/dL  Glucose, capillary     Status: Abnormal   Collection Time: 08/12/15  4:01 AM  Result Value Ref Range   Glucose-Capillary 113 (H) 65 - 99 mg/dL   Comment 1 Notify RN   CBC with Differential/Platelet     Status: Abnormal   Collection Time: 08/12/15  6:00 AM  Result Value Ref Range   WBC 3.8 3.8 - 10.6 K/uL   RBC 4.87 4.40 - 5.90 MIL/uL   Hemoglobin 13.5 13.0 - 18.0 g/dL   HCT 39.9 (L) 40.0 - 52.0 %   MCV 81.8 80.0 - 100.0 fL   MCH 27.7 26.0 - 34.0 pg   MCHC 33.8 32.0 - 36.0  g/dL   RDW 15.7 (H) 11.5 - 14.5 %   Platelets 151 150 - 440 K/uL   Neutrophils Relative % 37 %   Neutro Abs 1.4 1.4 - 6.5 K/uL   Lymphocytes Relative 46 %   Lymphs Abs 1.8 1.0 - 3.6 K/uL   Monocytes Relative 11 %   Monocytes Absolute 0.4 0.2 - 1.0 K/uL   Eosinophils Relative 5 %   Eosinophils Absolute 0.2 0 - 0.7 K/uL   Basophils Relative 1 %   Basophils Absolute 0.1 0 - 0.1 K/uL  Triglycerides     Status: Abnormal   Collection Time: 08/12/15  6:00 AM  Result Value Ref Range   Triglycerides 2189 (H) <150 mg/dL  Basic metabolic panel     Status: Abnormal (Preliminary result)   Collection Time: 08/12/15  6:00 AM  Result Value Ref Range   Sodium UNABLE TO REPORT DUE TO LIPEMIC INTERFERENCE 135 - 145 mmol/L   Potassium 3.9 3.5 - 5.1 mmol/L   Chloride 99 (L) 101 - 111 mmol/L   CO2 27 22 - 32 mmol/L   Glucose, Bld 105 (H) 65 - 99 mg/dL   BUN 6 6 - 20 mg/dL   Creatinine, Ser 0.86 0.61 - 1.24 mg/dL   Calcium 8.9 8.9 - 10.3 mg/dL   GFR calc non Af Amer >60 >60 mL/min   GFR calc Af Amer >60 >60 mL/min   Anion gap PENDING 5 - 15  Glucose, capillary     Status: Abnormal   Collection Time: 08/12/15  7:50 AM  Result Value Ref Range   Glucose-Capillary 107 (H) 65 - 99 mg/dL  Glucose, capillary     Status: None   Collection Time: 08/12/15 11:59 AM  Result Value Ref Range   Glucose-Capillary 92 65 - 99 mg/dL   Lab Results  Component Value Date   HGBA1C 11.7* 08/04/2015     Assessment Acute pancreatitis due to hypertriglyceridemia Familial hypertriglyceridemia Uncontrolled type 2 diabetes   Plan 1. Continue IV insulin and IV dextrose until serum Tg level <1000 2. Follow daily Tg level  3. Continue fibrate and fish oil and statin. 4. Continue only liquid diet.  5. Once Tg level <1000, will transition first to SQ insulin and then will advance diet.  I will follow along with you.

## 2015-08-13 LAB — GLUCOSE, CAPILLARY
GLUCOSE-CAPILLARY: 105 mg/dL — AB (ref 65–99)
GLUCOSE-CAPILLARY: 75 mg/dL (ref 65–99)
GLUCOSE-CAPILLARY: 86 mg/dL (ref 65–99)
GLUCOSE-CAPILLARY: 87 mg/dL (ref 65–99)
GLUCOSE-CAPILLARY: 96 mg/dL (ref 65–99)
GLUCOSE-CAPILLARY: 99 mg/dL (ref 65–99)
Glucose-Capillary: 130 mg/dL — ABNORMAL HIGH (ref 65–99)

## 2015-08-13 LAB — TRIGLYCERIDES: TRIGLYCERIDES: 1777 mg/dL — AB (ref <150)

## 2015-08-13 MED ORDER — OXYCODONE-ACETAMINOPHEN 5-325 MG PO TABS
2.0000 | ORAL_TABLET | Freq: Four times a day (QID) | ORAL | Status: DC | PRN
  Administered 2015-08-13 – 2015-08-15 (×6): 2 via ORAL
  Filled 2015-08-13: qty 2
  Filled 2015-08-13: qty 1
  Filled 2015-08-13 (×4): qty 2
  Filled 2015-08-13: qty 1

## 2015-08-13 MED ORDER — HYDROMORPHONE HCL 1 MG/ML IJ SOLN
2.0000 mg | INTRAMUSCULAR | Status: DC | PRN
  Administered 2015-08-13 – 2015-08-14 (×5): 2 mg via INTRAVENOUS
  Filled 2015-08-13 (×5): qty 2

## 2015-08-13 NOTE — Progress Notes (Signed)
Patient ID: ABOU STERKEL, male   DOB: 1976-02-21, 39 y.o.   MRN: [id number] [facility] PROGRESS NOTE  PCP: Crecencio Mc, MD  HPI/Subjective: Patient's still with abdominal pain 5 out of 10 intensity at times. With pain medication it does go down. Patient was advised to stop the IV pain medication and go with the oral pain medication. Patient complains of pain in bilateral arms at IV sites.  Objective: Filed Vitals:   08/13/15 0735  BP: 123/76  Pulse: 63  Temp: 97.5 F (36.4 C)  Resp: 18    Filed Weights   08/01/15 2023 08/02/15 0100  Weight: 108.863 kg (240 lb) 107.1 kg (236 lb 1.8 oz)    ROS: Review of Systems  Constitutional: Negative for fever and chills.  Eyes: Negative for blurred vision.  Respiratory: Negative for cough and shortness of breath.   Cardiovascular: Negative for chest pain.  Gastrointestinal: Positive for abdominal pain. Negative for nausea, vomiting, diarrhea and constipation.  Genitourinary: Negative for dysuria.  Musculoskeletal: Positive for myalgias. Negative for joint pain.  Neurological: Negative for dizziness and headaches.   Exam: Physical Exam  Constitutional: He is oriented to person, place, and time.  HENT:  Nose: No mucosal edema.  Mouth/Throat: No oropharyngeal exudate or posterior oropharyngeal edema.  Eyes: Conjunctivae, EOM and lids are normal. Pupils are equal, round, and reactive to light.  Neck: No JVD present. Carotid bruit is not present. No edema present. No thyroid mass and no thyromegaly present.  Cardiovascular: S1 normal and S2 normal.  Exam reveals no gallop.   No murmur heard. Pulses:      Dorsalis pedis pulses are 2+ on the right side, and 2+ on the left side.  Respiratory: No respiratory distress. He has no wheezes. He has no rhonchi. He has no rales.  GI: Soft. Bowel sounds are normal. There is tenderness in the epigastric area.  Musculoskeletal:       Right ankle: He exhibits no swelling.   Left ankle: He exhibits no swelling.  Lymphadenopathy:    He has no cervical adenopathy.  Neurological: He is alert and oriented to person, place, and time. No cranial nerve deficit.  Skin: Skin is warm. Nails show no clubbing.  Superficial thrombophlebitis bilateral arms. No signs of infection at this point. The tenderness left arm greater than the right arm.  Psychiatric: He has a normal mood and affect.   Data Reviewed: Basic Metabolic Panel:  Recent Labs Lab 08/08/15 0540 08/09/15 0341 08/10/15 0331 08/11/15 0342 08/12/15 0600  NA UNABLE TO PERFORM DUE TO LIPEMIC INTERFERENCE SEE COMMENTS SEE COMMENTS 134* UNABLE TO REPORT DUE TO LIPEMIC INTERFERENCE  K 3.1* 3.5 SEE COMMENTS 4.5 3.9  CL 95* 99* 100* 100* 99*  CO2 27 29 SEE COMMENTS 27 27  GLUCOSE 102* 129* SEE COMMENTS 117* 105*  BUN <5* <5* <5* 5* 6  CREATININE 0.82 0.85 0.84 0.79 0.86  CALCIUM 8.5* 8.9 SEE COMMENTS 9.0 8.9   CBC:  Recent Labs Lab 08/07/15 0420 08/12/15 0600  WBC 3.9 3.8  NEUTROABS  --  1.4  HGB 13.6 13.5  HCT 37.1* 39.9*  MCV 82.2 81.8  PLT 267 151    CBG:  Recent Labs Lab 08/12/15 2359 08/13/15 0102 08/13/15 0416 08/13/15 0733 08/13/15 1131  GLUCAP 75 130* 86 96 87    Recent Results (from the past 240 hour(s))  MRSA PCR Screening     Status: None   Collection Time: 08/04/15  6:39 PM  Result  Value Ref Range Status   MRSA by PCR NEGATIVE NEGATIVE Final    Comment:        The GeneXpert MRSA Assay (FDA approved for NASAL specimens only), is one component of a comprehensive MRSA colonization surveillance program. It is not intended to diagnose MRSA infection nor to guide or monitor treatment for MRSA infections.       Scheduled Meds: . aspirin EC  81 mg Oral Daily  . gemfibrozil  600 mg Oral BID AC  . heparin  5,000 Units Subcutaneous 3 times per day  . omega-3 acid ethyl esters  2 g Oral BID  . pantoprazole  40 mg Oral Daily  . potassium chloride  20 mEq Oral BID  .  rosuvastatin  10 mg Oral q1800  . sertraline  50 mg Oral Daily   Continuous Infusions: . dextrose 125 mL/hr at 08/13/15 1137  . insulin (NOVOLIN-R) infusion 10.8 Units/hr (08/12/15 1604)    Assessment/Plan:  1. Acute pancreatitis secondary to severe hypertriglyceridemia. Continue gemfibrozil and omega-3 fatty acid. Check triglyceride tomorrow a.m. On IV insulin drip and ivf hydration. Lipase normalized. Triglycerides down to 1777. Dr. Gabriel Carina is hopefully going to switch over to subcutaneous insulin tomorrow. 2. We will watch a day in the hospital and potential discharge on Friday 3. Type 2 diabetes mellitus- Currently on insulin drip. 4. Obesity and sleep apnea- patient had a gastric sleeve procedure. CPAP at night. 5. Depression- on Zoloft 6. Superficial thrombophlebitis bilateral arms left greater than right. No signs of infection continue ice.  Code Status:     Code Status Orders        Start     Ordered   08/01/15 2335  Full code   Continuous     08/01/15 2335     Disposition Plan: Home potentially Friday  Time spent: 25 minutes  Loletha Grayer  [facility]

## 2015-08-13 NOTE — Progress Notes (Signed)
Endocrinology follow up   39 y.o. male seen in follow up for hypertriglyceridemia induced acute pancreatitis. Still maintained on IV insulin at 10.8 units/hr and IV D5 at 25 cc/hr. Blood sugars have been in the range of 75-130 over last 24 hrs. Oral medications include gemfibrozil 600 mg bid, rosuvastatin 10 mg daily, and fish oil 2 g BID. Tg decreased from 2189 yesterday to 1777 today. He continues to report abd pain. Tolerating liquid diet. Complains of discomfort in left antecubital region where an IV was placed, which has since been removed.   Medical history Past Medical History  Diagnosis Date  . Recurrent acute pancreatitis     secondary to hypertriglyceridemia   . Gastric ulcer 2009  . Sleep apnea 1999    uses CPAP  . Chicken pox   . Diabetes mellitus   . HTN (hypertension)   . Familial hypertriglyceridemia     severe  . Morbid obesity [employer])     s/p bariatric sleeve surgery 01/2015    Surgical history Past Surgical History  Procedure Laterality Date  . Tonsillectomy  2003  . Vasectomy    . Lumbar disc surgery  2008  . Cholecystectomy    . Laparoscopic gastric sleeve resection       Medications . aspirin EC  81 mg Oral Daily  . gemfibrozil  600 mg Oral BID AC  . heparin  5,000 Units Subcutaneous 3 times per day  . omega-3 acid ethyl esters  2 g Oral BID  . pantoprazole  40 mg Oral Daily  . potassium chloride  20 mEq Oral BID  . rosuvastatin  10 mg Oral q1800  . sertraline  50 mg Oral Daily  IV insulin IV dextrose   Social history  Married. Employed. Social History  Substance Use Topics  . Smoking status: Never Smoker   . Smokeless tobacco: Never Used  . Alcohol Use: No     Comment: once month     Family history Family History  Problem Relation Age of Onset  . Adopted: Yes  . Hypertension Mother   . Hyperlipidemia Mother   . Hypertension Father   . Hyperlipidemia Father       Review of systems CV: no chest pain or palpitations PULM: no cough or shortness of breath GEN: No fevers or chills  Physical Exam BP 123/76 mmHg  Pulse 63  Temp(Src) 97.5 F (36.4 C) (Axillary)  Resp 18  Ht 6' (1.829 m)  Wt 107.1 kg (236 lb 1.8 oz)  BMI 32.02 kg/m2  SpO2 99%  GEN: Obese white  male, in NAD. HEENT: No proptosis, EOMI, lid lag or stare. Oropharynx is clear.  NECK: supple, trachea midline.   RESPIRATORY: clear bilaterally, no wheeze, good inspiratory effort. CV: No carotid bruits, RRR. ABD: soft, +diffuse non focal TTP, no guarding EXT: no peripheral edema SKIN: few xanthomas scattered on flexor surfaces of upper extremities LYMPH: no submandibular or supraclavicular LAD NEURO: PERRL, no dysarthria PSYC: alert and oriented, good insight  Labs Results for orders placed or performed during the hospital encounter of 08/01/15 (from the past 24 hour(s))  Glucose, capillary     Status: None   Collection Time: 08/12/15  4:11 PM  Result Value Ref Range   Glucose-Capillary 96 65 - 99 mg/dL  Glucose, capillary     Status: Abnormal   Collection Time: 08/12/15  8:01 PM  Result Value Ref Range   Glucose-Capillary 121 (H) 65 - 99 mg/dL  Glucose, capillary  Status: None   Collection Time: 08/12/15 11:59 PM  Result Value Ref Range   Glucose-Capillary 75 65 - 99 mg/dL  Glucose, capillary     Status: Abnormal   Collection Time: 08/13/15  1:02 AM  Result Value Ref Range   Glucose-Capillary 130 (H) 65 - 99 mg/dL  Glucose, capillary     Status: None   Collection Time: 08/13/15  4:16 AM  Result Value Ref Range   Glucose-Capillary 86 65 - 99 mg/dL  Triglycerides     Status: Abnormal   Collection Time: 08/13/15  6:03 AM  Result Value Ref Range   Triglycerides 1777 (H) <150 mg/dL  Glucose, capillary     Status: None   Collection Time: 08/13/15  7:33 AM  Result Value Ref Range   Glucose-Capillary 96 65 - 99 mg/dL  Glucose, capillary     Status: None    Collection Time: 08/13/15 11:31 AM  Result Value Ref Range   Glucose-Capillary 87 65 - 99 mg/dL   Comment 1 Notify RN    Lab Results  Component Value Date   HGBA1C 11.7* 08/04/2015     Assessment Acute pancreatitis due to hypertriglyceridemia Familial hypertriglyceridemia Uncontrolled type 2 diabetes   Plan 1. Tg level slowly improving with current regimen. Still plan to continue IV insulin and IV dextrose at this time. If his Tg level tomorrow morning is improved from today then likely will consider transitioning to SQ insulin and consider advancing diet. 2. Will follow up on his order for insulin pump as out-patient. 3. Continue fibrate and fish oil and statin. 4. Continue only liquid diet for now.  I will follow along with you.

## 2015-08-13 NOTE — Progress Notes (Signed)
Inpatient Diabetes Program Recommendations  AACE/ADA: New Consensus Statement on Inpatient Glycemic Control (2015)  Target Ranges:  Prepandial:   less than 140 mg/dL      Peak postprandial:   less than 180 mg/dL (1-2 hours)      Critically ill patients:  140 - 180 mg/dL   Review of Glycemic Control:  Results for EREZ, MCCALLUM (MRN [id number]) as of 08/13/2015 10:50  Ref. Range 08/12/2015 23:59 08/13/2015 01:02 08/13/2015 04:16 08/13/2015 06:03 08/13/2015 07:33  Glucose-Capillary Latest Ref Range: 65-99 mg/dL 75 130 (H) 86  96   Patient remains on insulin drip at continuous rate for triglycerides.  Spoke with patient regarding insulin pump/CGM.  He has done application and is hoping to get the insulin pump sooner than later.  He states that in the past he was on the insulin pump for 2 years and used CGM.  He seems eager to get back on insulin pump and is very interested in the new closed loop system (when it is available).  Note patient being followed closely by endocrinology.  Will follow.  Thanks, Adah Perl, RN, BC-ADM Inpatient Diabetes Coordinator Pager [phone] ([id number])

## 2015-08-13 NOTE — Progress Notes (Signed)
Nutrition Follow-up   INTERVENTION:   Coordination of care: Per Endocrinology MD note, pt to remain on CL until TG <1000, TG today 1777. And per MD [place] pt to remain CL today. Will continue to follow poc.  Also recommend new weight as last weight from admission >10days ago. Also recommend monitoring bowel regimen as pt last documented BM 10/29. Medical Food Supplement Therapy: if pt to remain on CL may need to consider Boost Breeze for additional nutrition however each supplement with 54g of carbohydrates.    NUTRITION DIAGNOSIS:   Inadequate oral intake related to acute illness as evidenced by  (CL diet order, taking sips of water).  GOAL:   Patient will meet greater than or equal to 90% of their needs; ongoing  MONITOR:    (Energy Intake, Anthropometrics, Electrolyte and Renal Profile, Gastrointestinal Profile, Glucose Profile)   ASSESSMENT:   Pt admitted with abdominal pain secondary to pancreatitis. Pt with h/o pancreatitis and hypertriglyceridemia. Per MD note, pt s/p gastric sleeve procedure in February 2016.   Per endocrinology MD note, pt stopped fibrate, fish oil, and diabetic medications after bariatric surgery April 2016 and has not been checking blood sugars. Currently pt maintained on IV insulin and D10 IVF, monitoring TG and FSBS. Pt continues to c/o abdominal pain but improving per MD note.  Pt diet order advanced to FL and Carb modified solid foods over the weekend, however pt TG elevated and diet order downgraded back to CL.  Diet Order:  Diet clear liquid Room service appropriate?: Yes; Fluid consistency:: Thin   Current Nutrition: Per documentation, pt ate average 50% of solid foods on 10/29th and 75% of meal trays on average of FL trays on 10/30. Pt has eaten 80-100% of CL trays since 10/31. Pt has been tolerating CL with no juice, sugar free items.   Gastrointestinal Profile: Last BM: 08/09/2015   Scheduled Medications:  . aspirin EC  81 mg Oral Daily   . gemfibrozil  600 mg Oral BID AC  . heparin  5,000 Units Subcutaneous 3 times per day  . omega-3 acid ethyl esters  2 g Oral BID  . pantoprazole  40 mg Oral Daily  . potassium chloride  20 mEq Oral BID  . rosuvastatin  10 mg Oral q1800  . sertraline  50 mg Oral Daily    Continuous Medications:  . dextrose 125 mL/hr at 08/13/15 1137  . insulin (NOVOLIN-R) infusion 10.8 Units/hr (08/12/15 1604)   Note: D10 at 138mL/hr provides 1020kcals in 24 hours   Electrolyte/Renal Profile and Glucose Profile:   Recent Labs Lab 08/10/15 0331 08/11/15 0342 08/12/15 0600  NA SEE COMMENTS 134* UNABLE TO REPORT DUE TO LIPEMIC INTERFERENCE  K SEE COMMENTS 4.5 3.9  CL 100* 100* 99*  CO2 SEE COMMENTS 27 27  BUN <5* 5* 6  CREATININE 0.84 0.79 0.86  CALCIUM SEE COMMENTS 9.0 8.9  GLUCOSE SEE COMMENTS 117* 105*   Protein Profile: No results for input(s): ALBUMIN in the last 168 hours.  Lipid Panel     Component Value Date/Time   CHOL 718* 08/01/2015 2209   CHOL 357* 09/04/2014 0328   CHOL 1116* 04/27/2012 0939   TRIG 1777* 08/13/2015 0603   TRIG 1201* 11/04/2014 0427   HDL UNABLE TO PERFORM DUE TO LIPEMIC INTERFERENCE 08/01/2015 2209   HDL SEE COMMENT 09/04/2014 0328   HDL 60 04/27/2012 0939   CHOLHDL SEE COMMENTS 08/01/2015 2209   CHOLHDL 18.6* 04/27/2012 0939   VLDL SEE COMMENTS 08/01/2015 2209  VLDL SEE COMMENT 09/04/2014 0328   LDLCALC SEE COMMENTS 08/01/2015 2209   LDLCALC SEE COMMENT 09/04/2014 0328   LDLCALC Comment 04/27/2012 0939   LDLDIRECT 111.4 06/30/2012 0950    Weight Trend since Admission: Filed Weights   08/01/15 2023 08/02/15 0100  Weight: 240 lb (108.863 kg) 236 lb 1.8 oz (107.1 kg)    BMI:  Body mass index is 32.02 kg/(m^2).  Estimated Nutritional Needs:   Kcal:  BEE: 1768kcals, TEE: (IF 1.1-1.3)(AF 1.2) 2332-2756kcals, using IBW of 80.9kg  Protein:  65-89g protein (0.8-1.0g/kg)   Fluid:  2023-2425mL of fluid (25-14mL/kG)  EDUCATION NEEDS:    Education needs no appropriate at this time   [place], RD, LDN Pager [phone]

## 2015-08-14 LAB — GLUCOSE, CAPILLARY
GLUCOSE-CAPILLARY: 104 mg/dL — AB (ref 65–99)
GLUCOSE-CAPILLARY: 87 mg/dL (ref 65–99)
GLUCOSE-CAPILLARY: 103 mg/dL — AB (ref 65–99)
GLUCOSE-CAPILLARY: 177 mg/dL — AB (ref 65–99)
Glucose-Capillary: 161 mg/dL — ABNORMAL HIGH (ref 65–99)
Glucose-Capillary: 65 mg/dL (ref 65–99)
Glucose-Capillary: 82 mg/dL (ref 65–99)
Glucose-Capillary: 87 mg/dL (ref 65–99)

## 2015-08-14 LAB — TRIGLYCERIDES: Triglycerides: 1285 mg/dL — ABNORMAL HIGH (ref <150)

## 2015-08-14 MED ORDER — HYDROMORPHONE HCL 1 MG/ML IJ SOLN
1.0000 mg | Freq: Four times a day (QID) | INTRAMUSCULAR | Status: DC | PRN
  Administered 2015-08-14 – 2015-08-15 (×3): 1 mg via INTRAVENOUS
  Filled 2015-08-14 (×3): qty 1

## 2015-08-14 MED ORDER — INSULIN GLARGINE 100 UNIT/ML ~~LOC~~ SOLN
50.0000 [IU] | SUBCUTANEOUS | Status: AC
Start: 2015-08-14 — End: 2015-08-14
  Administered 2015-08-14: 50 [IU] via SUBCUTANEOUS
  Filled 2015-08-14: qty 0.5

## 2015-08-14 MED ORDER — INSULIN ASPART 100 UNIT/ML ~~LOC~~ SOLN
0.0000 [IU] | Freq: Three times a day (TID) | SUBCUTANEOUS | Status: DC
  Administered 2015-08-14 – 2015-08-15 (×3): 3 [IU] via SUBCUTANEOUS
  Filled 2015-08-14 (×3): qty 3

## 2015-08-14 MED ORDER — INSULIN ASPART 100 UNIT/ML ~~LOC~~ SOLN
0.0000 [IU] | Freq: Every day | SUBCUTANEOUS | Status: DC
  Administered 2015-08-14: 4 [IU] via SUBCUTANEOUS
  Filled 2015-08-14: qty 4

## 2015-08-14 NOTE — Progress Notes (Signed)
Endocrinology follow up   39 y.o. male seen in follow up for hypertriglyceridemia induced acute pancreatitis. Still maintained on IV insulin at 10.8 units/hr and IV D5 at 25 cc/hr. Blood sugars have been in the range of 80 - 120 over last 24 hrs. Oral medications include gemfibrozil 600 mg bid, rosuvastatin 10 mg daily, and fish oil 2 g BID. Tg decreased from 1777 yesterday to 1285 today. Abd pain improved. Tolerating diet.   Medical history Past Medical History  Diagnosis Date  . Recurrent acute pancreatitis     secondary to hypertriglyceridemia   . Gastric ulcer 2009  . Sleep apnea 1999    uses CPAP  . Chicken pox   . Diabetes mellitus   . HTN (hypertension)   . Familial hypertriglyceridemia     severe  . Morbid obesity [facility])     s/p bariatric sleeve surgery 01/2015    Surgical history Past Surgical History  Procedure Laterality Date  . Tonsillectomy  2003  . Vasectomy    . Lumbar disc surgery  2008  . Cholecystectomy    . Laparoscopic gastric sleeve resection       Medications . aspirin EC  81 mg Oral Daily  . gemfibrozil  600 mg Oral BID AC  . heparin  5,000 Units Subcutaneous 3 times per day  . insulin aspart  0-12 Units Subcutaneous QHS  . insulin aspart  0-15 Units Subcutaneous TID WC  . omega-3 acid ethyl esters  2 g Oral BID  . pantoprazole  40 mg Oral Daily  . potassium chloride  20 mEq Oral BID  . rosuvastatin  10 mg Oral q1800  . sertraline  50 mg Oral Daily  IV insulin IV dextrose   Social history  Married. Employed. Social History  Substance Use Topics  . Smoking status: Never Smoker   . Smokeless tobacco: Never Used  . Alcohol Use: No     Comment: once month     Family history Family History  Problem Relation Age of Onset  . Adopted: Yes  . Hypertension Mother   . Hyperlipidemia Mother   . Hypertension Father   . Hyperlipidemia Father      Review of  systems CV: no chest pain or palpitations PULM: no cough or shortness of breath GEN: No fevers or chills  Physical Exam BP 114/77 mmHg  Pulse 74  Temp(Src) 97.6 F (36.4 C) (Oral)  Resp 16  Ht 6' (1.829 m)  Wt 106.414 kg (234 lb 9.6 oz)  BMI 31.81 kg/m2  SpO2 98%  GEN: Obese white  male, in NAD. HEENT: No proptosis, EOMI, lid lag or stare. Oropharynx is clear.  NECK: supple, trachea midline.   RESPIRATORY: clear bilaterally, no wheeze, good inspiratory effort. CV: No carotid bruits, RRR. ABD: soft, +diffuse non focal TTP, no guarding EXT: no peripheral edema SKIN: few xanthomas scattered on flexor surfaces of upper extremities LYMPH: no submandibular or supraclavicular LAD NEURO: PERRL, no dysarthria PSYC: alert and oriented, good insight  Labs Results for orders placed or performed during the hospital encounter of 08/01/15 (from the past 24 hour(s))  Glucose, capillary     Status: Abnormal   Collection Time: 08/13/15  4:13 PM  Result Value Ref Range   Glucose-Capillary 105 (H) 65 - 99 mg/dL  Glucose, capillary     Status: None   Collection Time: 08/13/15  8:50 PM  Result Value Ref Range   Glucose-Capillary 99 65 - 99 mg/dL   Comment 1 Notify RN  Glucose, capillary     Status: None   Collection Time: 08/14/15 12:44 AM  Result Value Ref Range   Glucose-Capillary 82 65 - 99 mg/dL   Comment 1 Notify RN   Glucose, capillary     Status: Abnormal   Collection Time: 08/14/15  3:37 AM  Result Value Ref Range   Glucose-Capillary 104 (H) 65 - 99 mg/dL   Comment 1 Notify RN   Triglycerides     Status: Abnormal   Collection Time: 08/14/15  4:49 AM  Result Value Ref Range   Triglycerides 1285 (H) <150 mg/dL  Glucose, capillary     Status: None   Collection Time: 08/14/15  7:43 AM  Result Value Ref Range   Glucose-Capillary 87 65 - 99 mg/dL   Comment 1 Notify RN   Glucose, capillary     Status: None   Collection Time: 08/14/15 11:11 AM  Result Value Ref Range    Glucose-Capillary 87 65 - 99 mg/dL   Comment 1 Notify RN    Lab Results  Component Value Date   HGBA1C 11.7* 08/04/2015     Assessment Acute pancreatitis due to hypertriglyceridemia Familial hypertriglyceridemia Uncontrolled type 2 diabetes   Plan 1. Tg level improved. Will transition to SQ insulin. 2. Give Lantus 50 units now. 3. DC IV insulin and IV dextrose in 2 hrs. 4. Change diet to sugar free, full liquid. 5. Tomorrow am will give low carb, sugar free. Consider egg whites + veg at breakfast. 6. Will follow up on his order for insulin pump as out-patient. 7. Continue fibrate and fish oil and statin.   I will follow along with you.

## 2015-08-14 NOTE — Progress Notes (Signed)
Spoke with patient.  He was very tired and wasn't up to a visit at this time.  Nice man. [place] [address]

## 2015-08-14 NOTE — Progress Notes (Signed)
Patient ID: Sergio [name], male   DOB: 02-24-1976, 39 y.o.   MRN: [id number] [facility] PROGRESS NOTE  PCP: Crecencio Mc, MD  HPI/Subjective: Patient still has abdominal pain. Pain in abdomen comes now with pain medication.   Objective: Filed Vitals:   08/14/15 1518  BP: 111/67  Pulse: 72  Temp: 98 F (36.7 C)  Resp: 16    Filed Weights   08/01/15 2023 08/02/15 0100 08/13/15 1451  Weight: 108.863 kg (240 lb) 107.1 kg (236 lb 1.8 oz) 106.414 kg (234 lb 9.6 oz)    ROS: Review of Systems  Constitutional: Negative for fever and chills.  Eyes: Negative for blurred vision.  Respiratory: Negative for cough and shortness of breath.   Cardiovascular: Negative for chest pain.  Gastrointestinal: Positive for abdominal pain. Negative for nausea, vomiting, diarrhea and constipation.  Genitourinary: Negative for dysuria.  Musculoskeletal: Negative for joint pain.  Neurological: Negative for dizziness and headaches.   Exam: Physical Exam  Constitutional: He is oriented to person, place, and time.  HENT:  Nose: No mucosal edema.  Mouth/Throat: No oropharyngeal exudate or posterior oropharyngeal edema.  Eyes: Conjunctivae, EOM and lids are normal. Pupils are equal, round, and reactive to light.  Neck: No JVD present. Carotid bruit is not present. No edema present. No thyroid mass and no thyromegaly present.  Cardiovascular: S1 normal and S2 normal.  Exam reveals no gallop.   No murmur heard. Pulses:      Dorsalis pedis pulses are 2+ on the right side, and 2+ on the left side.  Respiratory: No respiratory distress. He has no wheezes. He has no rhonchi. He has no rales.  GI: Soft. Bowel sounds are normal. There is tenderness in the epigastric area.  Musculoskeletal:       Right ankle: He exhibits no swelling.       Left ankle: He exhibits no swelling.  Lymphadenopathy:    He has no cervical adenopathy.  Neurological: He is alert and oriented to person, place, and  time. No cranial nerve deficit.  Skin: Skin is warm. No rash noted. Nails show no clubbing.  Psychiatric: He has a normal mood and affect.   Data Reviewed: Basic Metabolic Panel:  Recent Labs Lab 08/08/15 0540 08/09/15 0341 08/10/15 0331 08/11/15 0342 08/12/15 0600  NA UNABLE TO PERFORM DUE TO LIPEMIC INTERFERENCE SEE COMMENTS SEE COMMENTS 134* UNABLE TO REPORT DUE TO LIPEMIC INTERFERENCE  K 3.1* 3.5 SEE COMMENTS 4.5 3.9  CL 95* 99* 100* 100* 99*  CO2 27 29 SEE COMMENTS 27 27  GLUCOSE 102* 129* SEE COMMENTS 117* 105*  BUN <5* <5* <5* 5* 6  CREATININE 0.82 0.85 0.84 0.79 0.86  CALCIUM 8.5* 8.9 SEE COMMENTS 9.0 8.9   CBC:  Recent Labs Lab 08/12/15 0600  WBC 3.8  NEUTROABS 1.4  HGB 13.5  HCT 39.9*  MCV 81.8  PLT 151    CBG:  Recent Labs Lab 08/14/15 0337 08/14/15 0743 08/14/15 1111 08/14/15 1319 08/14/15 1407  GLUCAP 104* 87 87 65 103*    Recent Results (from the past 240 hour(s))  MRSA PCR Screening     Status: None   Collection Time: 08/04/15  6:39 PM  Result Value Ref Range Status   MRSA by PCR NEGATIVE NEGATIVE Final    Comment:        The GeneXpert MRSA Assay (FDA approved for NASAL specimens only), is one component of a comprehensive MRSA colonization surveillance program. It is not intended to diagnose  MRSA infection nor to guide or monitor treatment for MRSA infections.       Scheduled Meds: . aspirin EC  81 mg Oral Daily  . gemfibrozil  600 mg Oral BID AC  . heparin  5,000 Units Subcutaneous 3 times per day  . insulin aspart  0-12 Units Subcutaneous QHS  . insulin aspart  0-15 Units Subcutaneous TID WC  . omega-3 acid ethyl esters  2 g Oral BID  . pantoprazole  40 mg Oral Daily  . potassium chloride  20 mEq Oral BID  . rosuvastatin  10 mg Oral q1800  . sertraline  50 mg Oral Daily   Assessment/Plan:  1. Acute pancreatitis secondary to severe hypertriglyceridemia. Continue gemfibrozil and omega-3 fatty acid.  Patient triglycerides  came thousand to 1285 today. Dr. Gabriel Carina switched from insulin drip over to insulin coverage. I will let Dr. Gabriel Carina advance the diet. Potential discharge home tomorrow. 2. Type 2 diabetes mellitus- converted by Dr. Gabriel Carina over to sliding scale and Lantus. 3. Obesity and sleep apnea- patient had a gastric sleeve procedure. CPAP at night. 4. Depression- on Zoloft  Code Status:     Code Status Orders        Start     Ordered   08/01/15 2335  Full code   Continuous     08/01/15 2335     Disposition Plan: Home potentially tomorrow  Time spent: 20 minutes  Loletha Grayer  [facility]

## 2015-08-15 ENCOUNTER — Telehealth: Payer: Self-pay | Admitting: Internal Medicine

## 2015-08-15 LAB — GLUCOSE, CAPILLARY
GLUCOSE-CAPILLARY: 119 mg/dL — AB (ref 65–99)
GLUCOSE-CAPILLARY: 169 mg/dL — AB (ref 65–99)
Glucose-Capillary: 111 mg/dL — ABNORMAL HIGH (ref 65–99)
Glucose-Capillary: 172 mg/dL — ABNORMAL HIGH (ref 65–99)

## 2015-08-15 MED ORDER — INSULIN ASPART 100 UNIT/ML ~~LOC~~ SOLN
10.0000 [IU] | Freq: Three times a day (TID) | SUBCUTANEOUS | Status: DC
Start: 2015-08-15 — End: 2015-08-15
  Administered 2015-08-15: 10 [IU] via SUBCUTANEOUS
  Filled 2015-08-15: qty 10

## 2015-08-15 MED ORDER — OXYCODONE-ACETAMINOPHEN 10-325 MG PO TABS: 1.0000 | ORAL_TABLET | Freq: Four times a day (QID) | ORAL | Status: DC | PRN

## 2015-08-15 MED ORDER — INSULIN GLARGINE 100 UNIT/ML ~~LOC~~ SOLN: 50.0000 [IU] | Freq: Every day | SUBCUTANEOUS | Status: DC

## 2015-08-15 MED ORDER — ROSUVASTATIN CALCIUM 10 MG PO TABS: 10.0000 mg | ORAL_TABLET | Freq: Every day | ORAL | Status: DC

## 2015-08-15 MED ORDER — OMEGA-3-ACID ETHYL ESTERS 1 G PO CAPS: 2.0000 g | ORAL_CAPSULE | Freq: Two times a day (BID) | ORAL | Status: DC

## 2015-08-15 MED ORDER — INSULIN ASPART 100 UNIT/ML ~~LOC~~ SOLN: 10.0000 [IU] | Freq: Three times a day (TID) | SUBCUTANEOUS | Status: DC

## 2015-08-15 MED ORDER — ALPRAZOLAM 0.5 MG PO TABS: 0.5000 mg | ORAL_TABLET | Freq: Every evening | ORAL | Status: DC | PRN

## 2015-08-15 MED ORDER — INSULIN GLARGINE 100 UNIT/ML ~~LOC~~ SOLN
50.0000 [IU] | Freq: Every day | SUBCUTANEOUS | Status: DC
  Administered 2015-08-15: 50 [IU] via SUBCUTANEOUS
  Filled 2015-08-15: qty 0.5

## 2015-08-15 MED ORDER — GEMFIBROZIL 600 MG PO TABS: 600.0000 mg | ORAL_TABLET | Freq: Two times a day (BID) | ORAL | Status: DC

## 2015-08-15 NOTE — Telephone Encounter (Signed)
Thank you. Will continue to follow up with call and appointment

## 2015-08-15 NOTE — Plan of Care (Signed)
Problem: Discharge Progression Outcomes Goal: Other Discharge Outcomes/Goals Outcome: Completed/Met Date Met:  08/15/15 Pt is alert and oriented x 4, up in room independently, on room air, vital signs stable, c/o abdominal pain, prn oxy and dilaudid given, no bm throughout shift, denies n/v, toleratingg diet. Pt is d/c to home, rx provided to patient, education provided on lantus and apidra and monitoring blood glucose. Pt is to f/u with Dr. Della Goo on 11/11 and is to f/u with pcp Dr. Derrel Nip.  Pt reports discharge instructions and has no further questions at this time. Triglyceride levels trending down.

## 2015-08-15 NOTE — Telephone Encounter (Signed)
HFU, Pt is being discharged today. Diagnosis is Pancreatitis. 10:00am pt will be discharged. No appt avail to sch. Let me know where to sch please and thank you!

## 2015-08-15 NOTE — Progress Notes (Signed)
Endocrinology follow up   39 y.o. male seen in follow up for hypertriglyceridemia induced acute pancreatitis. Yesterday the IV insulin and IV dextrose were stopped. No ow Lantus 50 units daily and Novolog qACHS SSI. Sugars have been in the 110 - 180 range today. Oral medications include gemfibrozil 600 mg bid, rosuvastatin 10 mg daily, and fish oil 2 g BID. Abd pain controlled on PRN Percocet. Tolerating diet.   Medical history Past Medical History  Diagnosis Date  . Recurrent acute pancreatitis     secondary to hypertriglyceridemia   . Gastric ulcer 2009  . Sleep apnea 1999    uses CPAP  . Chicken pox   . Diabetes mellitus   . HTN (hypertension)   . Familial hypertriglyceridemia     severe  . Morbid obesity [facility])     s/p bariatric sleeve surgery 01/2015    Surgical history Past Surgical History  Procedure Laterality Date  . Tonsillectomy  2003  . Vasectomy    . Lumbar disc surgery  2008  . Cholecystectomy    . Laparoscopic gastric sleeve resection       Medications . aspirin EC  81 mg Oral Daily  . gemfibrozil  600 mg Oral BID AC  . heparin  5,000 Units Subcutaneous 3 times per day  . insulin aspart  0-12 Units Subcutaneous QHS  . insulin aspart  0-15 Units Subcutaneous TID WC  . insulin aspart  10 Units Subcutaneous TID WC  . insulin glargine  50 Units Subcutaneous Daily  . omega-3 acid ethyl esters  2 g Oral BID  . pantoprazole  40 mg Oral Daily  . potassium chloride  20 mEq Oral BID  . rosuvastatin  10 mg Oral q1800  . sertraline  50 mg Oral Daily  IV insulin IV dextrose   Social history  Married. Employed. Social History  Substance Use Topics  . Smoking status: Never Smoker   . Smokeless tobacco: Never Used  . Alcohol Use: No     Comment: once month     Family history Family History  Problem Relation Age of Onset  . Adopted: Yes  . Hypertension Mother   .  Hyperlipidemia Mother   . Hypertension Father   . Hyperlipidemia Father      Review of systems No N/V. Tol diet. No sob or cough. No fever, No dysuria.  Physical Exam BP 124/77 mmHg  Pulse 70  Temp(Src) 98.3 F (36.8 C) (Oral)  Resp 18  Ht 6' (1.829 m)  Wt 104.509 kg (230 lb 6.4 oz)  BMI 31.24 kg/m2  SpO2 97%  GEN: Obese white  male, in NAD.  Labs Results for orders placed or performed during the hospital encounter of 08/01/15 (from the past 24 hour(s))  Glucose, capillary     Status: None   Collection Time: 08/14/15  1:19 PM  Result Value Ref Range   Glucose-Capillary 65 65 - 99 mg/dL   Comment 1 Notify RN   Glucose, capillary     Status: Abnormal   Collection Time: 08/14/15  2:07 PM  Result Value Ref Range   Glucose-Capillary 103 (H) 65 - 99 mg/dL   Comment 1 Notify RN   Glucose, capillary     Status: Abnormal   Collection Time: 08/14/15  4:23 PM  Result Value Ref Range   Glucose-Capillary 177 (H) 65 - 99 mg/dL   Comment 1 Notify RN   Glucose, capillary     Status: Abnormal   Collection Time: 08/14/15  7:59  PM  Result Value Ref Range   Glucose-Capillary 161 (H) 65 - 99 mg/dL   Comment 1 Notify RN   Glucose, capillary     Status: Abnormal   Collection Time: 08/15/15 12:07 AM  Result Value Ref Range   Glucose-Capillary 119 (H) 65 - 99 mg/dL   Comment 1 Notify RN   Glucose, capillary     Status: Abnormal   Collection Time: 08/15/15  4:07 AM  Result Value Ref Range   Glucose-Capillary 111 (H) 65 - 99 mg/dL   Comment 1 Notify RN   Glucose, capillary     Status: Abnormal   Collection Time: 08/15/15  7:24 AM  Result Value Ref Range   Glucose-Capillary 169 (H) 65 - 99 mg/dL  Glucose, capillary     Status: Abnormal   Collection Time: 08/15/15 11:15 AM  Result Value Ref Range   Glucose-Capillary 172 (H) 65 - 99 mg/dL   Comment 1 Notify RN    Lab Results  Component Value Date   HGBA1C 11.7* 08/04/2015     Assessment Acute pancreatitis due to  hypertriglyceridemia Familial hypertriglyceridemia Uncontrolled type 2 diabetes   Plan 1. Add scheduled NovoLog 10 units tid AC. 2. Continue Lantus 50 units q24 hrs. 3. DC IV insulin and IV dextrose in 2 hrs. 4. Continue fibrate, fish oil and statin. 5. Pt to DC home today. He will be seen in my clinic in 1-2 weeks.

## 2015-08-15 NOTE — Telephone Encounter (Signed)
Hospital follow up?

## 2015-08-15 NOTE — Discharge Summary (Signed)
[facility] [place] NAME: Gregary Blackard    MR#:  [id number]  DATE OF BIRTH:  [birth year]  DATE OF ADMISSION:  08/01/2015 ADMITTING PHYSICIAN: Lytle Butte, MD  DATE OF DISCHARGE: 08/15/2015  PRIMARY CARE PHYSICIAN: Crecencio Mc, MD    ADMISSION DIAGNOSIS:  Hyponatremia [E87.1] Epigastric pain [R10.13]  DISCHARGE DIAGNOSIS:  Principal Problem:   Hypertriglyceridemia   SECONDARY DIAGNOSIS:   Past Medical History  Diagnosis Date  . Recurrent acute pancreatitis     secondary to hypertriglyceridemia   . Gastric ulcer 2009  . Sleep apnea 1999    uses CPAP  . Chicken pox   . Diabetes mellitus   . HTN (hypertension)   . Familial hypertriglyceridemia     severe  . Morbid obesity ([place])     s/p bariatric sleeve surgery 01/2015    HOSPITAL COURSE:   1. Severe hypertriglyceridemia. Patient's triglycerides were greater than 5000 on presentation and continued to remain high the entire hospital stay. The patient was on insulin drip, Crestor, gemfibrozil, omega-3 fatty acids. The triglycerides came down to about 1200 yesterday. We advanced the diet and did not check them on the day of discharge. Patient needs to be on medication and follow-up as outpatient. Has to stay strict with his low carbohydrate diet. 2. Acute pancreatitis secondary to hypertriglyceridemia. The patient had abdominal pain during the entire hospital course pain. Pain was relieved with pain medications. 3. Sleep apnea on CPAP 4. Superficial phlebitis secondary to IV sites- no signs of infection continue warm compress at home. 5. History of hypertension- blood pressure stable off meds at this point 6. Type 2 diabetes mellitus- patient was on insulin drip the entire hospital course. Switched over to subcutaneous insulin yesterday by Dr. Gabriel Carina. I prescribed Lantus pen 50 units subcutaneous injection daily and Apidra pen 10 units prior to meals. 7. Abnormal readings  on blood test secondary to the blood being lipemic. 8. Hypokalemia- secondary to the patient being on liquid diet for most of the hospital stay.  DISCHARGE CONDITIONS:   Satisfactory  CONSULTS OBTAINED:  Treatment Team:  Lytle Butte, MD Abby Percell Locus, MD  DRUG ALLERGIES:   Allergies  Allergen Reactions  . Codeine Anaphylaxis  . Ivp Dye [Iodinated Diagnostic Agents] Other (See Comments)    Kidneys stop working    DISCHARGE MEDICATIONS:   Current Discharge Medication List    START taking these medications   Details  gemfibrozil (LOPID) 600 MG tablet Take 1 tablet (600 mg total) by mouth 2 (two) times daily before a meal. Qty: 60 tablet, Refills: 0    insulin aspart (NOVOLOG) 100 UNIT/ML injection Inject 10 Units into the skin 3 (three) times daily with meals. Qty: 10 mL, Refills: 11    insulin glargine (LANTUS) 100 UNIT/ML injection Inject 0.5 mLs (50 Units total) into the skin daily. Qty: 10 mL, Refills: 11    omega-3 acid ethyl esters (LOVAZA) 1 G capsule Take 2 capsules (2 g total) by mouth 2 (two) times daily. Qty: 120 capsule, Refills: 0    oxyCODONE-acetaminophen (PERCOCET) 10-325 MG tablet Take 1 tablet by mouth every 6 (six) hours as needed for pain. Qty: 60 tablet, Refills: 0    rosuvastatin (CRESTOR) 10 MG tablet Take 1 tablet (10 mg total) by mouth daily at 6 PM. Qty: 30 tablet, Refills: 0      CONTINUE these medications which have CHANGED   Details  ALPRAZolam (XANAX) 0.5 MG tablet  Take 1 tablet (0.5 mg total) by mouth at bedtime as needed for anxiety. Qty: 15 tablet, Refills: 0      CONTINUE these medications which have NOT CHANGED   Details  sertraline (ZOLOFT) 50 MG tablet Take 50 mg by mouth daily.    aspirin EC 81 MG tablet Take 81 mg by mouth daily.        STOP taking these medications     amphetamine-dextroamphetamine (ADDERALL) 10 MG tablet      benzonatate (TESSALON) 200 MG capsule      carvedilol (COREG) 6.25 MG tablet       gabapentin (NEURONTIN) 300 MG capsule      levofloxacin (LEVAQUIN) 500 MG tablet      lisinopril (PRINIVIL,ZESTRIL) 20 MG tablet      metFORMIN (GLUCOPHAGE) 500 MG tablet      omeprazole (PRILOSEC) 20 MG capsule      UNABLE TO FIND          DISCHARGE INSTRUCTIONS:   Follow-up with Dr. Gabriel Carina one week for consideration of insulin pump Follow-up with Dr. Derrel Nip 2 weeks  If you experience worsening of your admission symptoms, develop shortness of breath, life threatening emergency, suicidal or homicidal thoughts you must seek medical attention immediately by calling 911 or calling your MD immediately  if symptoms less severe.  You Must read complete instructions/literature along with all the possible adverse reactions/side effects for all the Medicines you take and that have been prescribed to you. Take any new Medicines after you have completely understood and accept all the possible adverse reactions/side effects.   Please note  You were cared for by a hospitalist during your hospital stay. If you have any questions about your discharge medications or the care you received while you were in the hospital after you are discharged, you can call the unit and asked to speak with the hospitalist on call if the hospitalist that took care of you is not available. Once you are discharged, your primary care physician will handle any further medical issues. Please note that NO REFILLS for any discharge medications will be authorized once you are discharged, as it is imperative that you return to your primary care physician (or establish a relationship with a primary care physician if you do not have one) for your aftercare needs so that they can reassess your need for medications and monitor your lab values.    Today   CHIEF COMPLAINT:   Chief Complaint  Patient presents with  . Abdominal Pain    HISTORY OF PRESENT ILLNESS:  Sergio [name]  is a 39 y.o. male with a known history of  hypertriglyceridemia presented with abdominal pain found to have an acute pancreatitis secondary to hypertriglyceridemia   VITAL SIGNS:  Blood pressure 124/77, pulse 70, temperature 98.3 F (36.8 C), temperature source Oral, resp. rate 18, height 6' (1.829 m), weight 104.509 kg (230 lb 6.4 oz), SpO2 97 %.    PHYSICAL EXAMINATION:  GENERAL:  39 y.o.-year-old patient lying in the bed with no acute distress.  EYES: Pupils equal, round, reactive to light and accommodation. No scleral icterus. Extraocular muscles intact.  HEENT: Head atraumatic, normocephalic. Oropharynx and nasopharynx clear.  NECK:  Supple, no jugular venous distention. No thyroid enlargement, no tenderness.  LUNGS: Normal breath sounds bilaterally, no wheezing, rales,rhonchi or crepitation. No use of accessory muscles of respiration.  CARDIOVASCULAR: S1, S2 normal. No murmurs, rubs, or gallops.  ABDOMEN: Soft, slight tenderness epigastric area, non-distended. Bowel sounds present. No  organomegaly or mass.  EXTREMITIES: No pedal edema, cyanosis, or clubbing.  NEUROLOGIC: Cranial nerves II through XII are intact. Muscle strength 5/5 in all extremities. Sensation intact. Gait not checked.  PSYCHIATRIC: The patient is alert and oriented x 3.  SKIN: Left antecubital fullness and pain to palpation but no redness. Right antecubital some fullness and less pain. No signs of infection.  DATA REVIEW:   CBC  Recent Labs Lab 08/12/15 0600  WBC 3.8  HGB 13.5  HCT 39.9*  PLT 151    Chemistries   Recent Labs Lab 08/12/15 0600  NA UNABLE TO REPORT DUE TO LIPEMIC INTERFERENCE  K 3.9  CL 99*  CO2 27  GLUCOSE 105*  BUN 6  CREATININE 0.86  CALCIUM 8.9    Cardiac Enzymes No results for input(s): TROPONINI in the last 168 hours.  Microbiology Results  Results for orders placed or performed during the hospital encounter of 08/01/15  MRSA PCR Screening     Status: None   Collection Time: 08/04/15  6:39 PM  Result Value  Ref Range Status   MRSA by PCR NEGATIVE NEGATIVE Final    Comment:        The GeneXpert MRSA Assay (FDA approved for NASAL specimens only), is one component of a comprehensive MRSA colonization surveillance program. It is not intended to diagnose MRSA infection nor to guide or monitor treatment for MRSA infections.    Management plans discussed with the patient, and he is in agreement.  CODE STATUS:     Code Status Orders        Start     Ordered   08/01/15 2335  Full code   Continuous     08/01/15 2335      TOTAL TIME TAKING CARE OF THIS PATIENT: 35 minutes.    Loletha Grayer M.D on 08/15/2015 at 8:53 AM  Between 7am to 6pm - Pager - [phone]  After 6pm go to [email or web address] - password [email or web address] [facility]  Office  [phone]  CC: Primary care physician; Crecencio Mc, MD

## 2015-08-19 ENCOUNTER — Telehealth: Payer: Self-pay

## 2015-08-19 NOTE — Telephone Encounter (Signed)
Transition Care Management Follow-up Telephone Call   Date discharged? 08/15/15   How have you been since you were released from the hospital? Not sleeping well.  Concerns of withdrawals from pain medication while hospitalized.  Denies pain, n/v/d.     Do you understand why you were in the hospital? Yes, abdominal pain   Do you understand the discharge instructions? Yes  Where were you discharged to? Home  Items Reviewed:  Medications reviewed: Started CRESTOR and has noticed some flushing that lasts for about 20 minutes.  Continuing/stopped all other medications as ordered.    Allergies reviewed: Yes, no change  Dietary changes reviewed: Yes  Referrals referrals:   Yes   Functional Questionnaire:   Activities of Daily Living (ADLs):   He states they are independent in the following: Independent in all ADLs States they require assistance with the following: No assistance required at this time   Any transportation issues/concerns?: No   Any patient concerns? Yes, concerns about A1C being 11.2, appointment to Endo made.    Confirmed importance and date/time of follow-up visits scheduled Yes appointment scheduled 08/22/15 at 230p  Provider Appointment booked with Dr. Derrel Nip (PCP)  Confirmed with patient if condition begins to worsen call PCP or go to the ER.  Patient was given the office number and encouraged to call back with question or concerns.  : Yes, patient verbalized understanding.

## 2015-08-22 ENCOUNTER — Encounter: Payer: Self-pay | Admitting: Internal Medicine

## 2015-08-22 ENCOUNTER — Ambulatory Visit (INDEPENDENT_AMBULATORY_CARE_PROVIDER_SITE_OTHER): Payer: BLUE CROSS/BLUE SHIELD | Admitting: Internal Medicine

## 2015-08-22 VITALS — BP 120/90 | HR 89 | Temp 98.3°F | Resp 12 | Wt 231.4 lb

## 2015-08-22 DIAGNOSIS — E781 Pure hyperglyceridemia: Secondary | ICD-10-CM

## 2015-08-22 DIAGNOSIS — K861 Other chronic pancreatitis: Secondary | ICD-10-CM | POA: Diagnosis not present

## 2015-08-22 DIAGNOSIS — F411 Generalized anxiety disorder: Secondary | ICD-10-CM

## 2015-08-22 DIAGNOSIS — K859 Acute pancreatitis without necrosis or infection, unspecified: Secondary | ICD-10-CM

## 2015-08-22 DIAGNOSIS — IMO0002 Reserved for concepts with insufficient information to code with codable children: Secondary | ICD-10-CM

## 2015-08-22 DIAGNOSIS — E1165 Type 2 diabetes mellitus with hyperglycemia: Secondary | ICD-10-CM

## 2015-08-22 DIAGNOSIS — Z794 Long term (current) use of insulin: Secondary | ICD-10-CM

## 2015-08-22 DIAGNOSIS — E1144 Type 2 diabetes mellitus with diabetic amyotrophy: Secondary | ICD-10-CM

## 2015-08-22 NOTE — Progress Notes (Signed)
Pre visit review using our clinic review tool, if applicable. No additional management support is needed unless otherwise documented below in the visit note. 

## 2015-08-22 NOTE — Progress Notes (Signed)
Subjective:  Patient ID: Sergio Sergio [name], male    DOB: Jan 09, 1976  Age: 39 y.o. MRN: [id number]  CC: The primary encounter diagnosis was Generalized anxiety disorder. 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  HPI Sergio Sergio [name] presents for hospital follow up.  Patient was admitted to [facility] on  Octoer 21 for acute. Pancreatitis secondary to hypertriglyceridemia,  And discharged on November 4th. This was his  first hospitalization since he underwent bariatric surgery in February  2016.  He states that the episode was caused by overeating.  He had stopped his statin and fenofibrate and had missed several lab appts with [name].  He started stress eating due to pressures at work,  Sweet tea , cady bars, and cookies. .   Admission  Triglycerides were > 5000 and A1c was 11.7 .  He was treated with IV insulin , IV fluids, and dilaudid followed by Percocet  for pain control .  He has not been sleeping well due to narcotics withdrawal. . He saw [name] today and was prescribed trazodone 100 mg QHS and advised him to stop all narcotic .    He is dealing with the disappointment that despite the surgery,  He  Remains insulin dependent and fenofibrate dependent.  He is now taking 4 grams fish oil daily   Sees a psychiatrist ,  [name] whose office is in [place] since his bariatric surgery in February .   Outpatient Prescriptions Prior to Visit  Medication Sig Dispense Refill  . ALPRAZolam (XANAX) 0.5 MG tablet Take 1 tablet (0.5 mg total) by mouth at bedtime as needed for anxiety. 15 tablet 0  . gemfibrozil (LOPID) 600 MG tablet Take 1 tablet (600 mg total) by mouth 2 (two) times daily before a meal. 60 tablet 0  . insulin aspart (NOVOLOG) 100 UNIT/ML injection Inject 10 Units into the skin 3 (three) times daily with meals.  10 mL 11  . insulin glargine (LANTUS) 100 UNIT/ML injection Inject 0.5 mLs (50 Units total) into the skin daily. 10 mL 11  . omega-3 acid ethyl esters (LOVAZA) 1 G capsule Take 2 capsules (2 g total) by mouth 2 (two) times daily. 120 capsule 0  . rosuvastatin (CRESTOR) 10 MG tablet Take 1 tablet (10 mg total) by mouth daily at 6 PM. 30 tablet 0  . sertraline (ZOLOFT) 50 MG tablet Take 50 mg by mouth daily.    [name] aspirin EC 81 MG tablet Take 81 mg by mouth daily.      [name] oxyCODONE-acetaminophen (PERCOCET) 10-325 MG tablet Take 1 tablet by mouth every 6 (six) hours as needed for pain. (Patient not taking: Reported on 08/22/2015) 60 tablet 0   No facility-administered medications prior to visit.    Review of Systems;  Patient denies headache, fevers, malaise, unintentional weight loss, skin rash, eye pain, sinus congestion and sinus pain, sore throat, dysphagia,  hemoptysis , cough, dyspnea, wheezing, chest pain, palpitations, orthopnea, edema, abdominal pain, nausea, melena, diarrhea, constipation, flank pain, dysuria, hematuria, urinary  Frequency, nocturia, numbness, tingling, seizures,  Focal weakness, Loss of consciousness,  Tremor, insomnia, depression, anxiety, and suicidal ideation.      Objective:  BP 120/90 mmHg  Pulse 89  Temp(Src) 98.3 F (36.8 C) (Oral)  Wt 231 lb 6.4 oz (104.962 kg)  SpO2 97%  BP Readings from Last 3 Encounters:  08/22/15 120/90  08/15/15 124/77  01/29/15 120/94    Wt Readings from Last 3 Encounters:  08/22/15 231 lb 6.4 oz (104.962 kg)  08/15/15 230 lb 6.4 oz (104.509 kg)  01/29/15 212 lb 8 oz (96.389 kg)    General appearance: alert, cooperative and appears stated age Ears: normal TM's and external ear canals both ears Throat: lips, mucosa, and tongue normal; teeth and gums normal Neck: no adenopathy, no carotid bruit, supple, symmetrical, trachea midline and thyroid not enlarged, symmetric, no tenderness/mass/nodules Back: symmetric, no curvature.  ROM normal. No CVA tenderness. Lungs: clear to auscultation bilaterally Heart: regular rate and rhythm, S1, S2 normal, no murmur, click, rub or gallop Abdomen: soft, non-tender; bowel sounds normal; no masses,  no organomegaly Pulses: 2+ and symmetric Skin: Skin color, texture, turgor normal. No rashes or lesions Lymph nodes: Cervical, supraclavicular, and axillary nodes normal.  Lab Results  Component Value Date   HGBA1C 11.7* 08/04/2015   HGBA1C 9.0* 09/04/2014   HGBA1C 9.6* 07/26/2014    Lab Results  Component Value Date   CREATININE 0.86 08/12/2015   CREATININE 0.79 08/11/2015   CREATININE 0.84 08/10/2015    Lab Results  Component Value Date   WBC 3.8 08/12/2015   HGB 13.5 08/12/2015   HCT 39.9* 08/12/2015   PLT 151 08/12/2015   GLUCOSE 105* 08/12/2015   CHOL 718* 08/01/2015   TRIG 1285* 08/14/2015   HDL UNABLE TO PERFORM DUE TO LIPEMIC INTERFERENCE 08/01/2015   LDLDIRECT 111.4 06/30/2012   LDLCALC SEE COMMENTS 08/01/2015   ALT UNABLE TO REPORT DUE TO LIPEMIC INTERFERENCE 08/01/2015   AST UNABLE TO REPORT DUE TO LIPEMIC INTERFERENCE 08/01/2015   NA UNABLE TO REPORT DUE TO LIPEMIC INTERFERENCE 08/12/2015   K 3.9 08/12/2015   CL 99* 08/12/2015   CREATININE 0.86 08/12/2015   BUN 6 08/12/2015   CO2 27 08/12/2015   INR 1.0 11/04/2014   HGBA1C 11.7* 08/04/2015    No results found.  Assessment & Plan:   Problem List Items Addressed This Visit    DM (diabetes mellitus), type 2, uncontrolled w/neurologic complication ([place])    Managed by [name] , last a1c 11.7, with Lantus  And Novolog tid       Hypertriglyceridemia    Now managed with Crestor, Lopid and fish oil.       Generalized anxiety disorder - Primary    Agree with addition of trazodone for insomnia. Follow up with psychiatry for assistance with narcotics dependence,      Recurrent pancreatitis ([place])    Secondary to trigs > 5000 du eto medication and dietary noncompliance despite gastric bypass  surgery.          I am having Mr. Estepa maintain his aspirin EC, sertraline, ALPRAZolam, gemfibrozil, omega-3 acid ethyl esters, rosuvastatin, insulin aspart, insulin glargine, and oxyCODONE-acetaminophen.  No orders of the defined types were placed in this encounter.    There are no discontinued medications.  Follow-up: Return in about 6 months (around 02/19/2016).   Crecencio Mc, MD

## 2015-08-24 ENCOUNTER — Encounter: Payer: Self-pay | Admitting: Internal Medicine

## 2015-08-24 NOTE — Assessment & Plan Note (Signed)
Agree with addition of trazodone for insomnia. Follow up with psychiatry for assistance with narcotics dependence,

## 2015-08-24 NOTE — Assessment & Plan Note (Signed)
Managed by [name] , last a1c 11.7, with Lantus  And Novolog tid

## 2015-08-24 NOTE — Assessment & Plan Note (Signed)
Secondary to trigs > 5000 du eto medication and dietary noncompliance despite gastric bypass surgery.

## 2015-08-24 NOTE — Assessment & Plan Note (Signed)
Now managed with Crestor, Lopid and fish oil.

## 2015-09-12 ENCOUNTER — Encounter: Payer: Self-pay | Admitting: Internal Medicine

## 2015-09-12 ENCOUNTER — Other Ambulatory Visit: Payer: Self-pay | Admitting: Family Medicine

## 2015-09-12 ENCOUNTER — Other Ambulatory Visit: Payer: Self-pay | Admitting: Internal Medicine

## 2015-09-12 NOTE — Telephone Encounter (Signed)
Patient is needing a refill. This medication was refilled 08/15/15. Please advise?

## 2015-09-17 ENCOUNTER — Telehealth: Payer: Self-pay

## 2015-09-17 ENCOUNTER — Other Ambulatory Visit: Payer: Self-pay | Admitting: Internal Medicine

## 2015-09-17 MED ORDER — ALPRAZOLAM 0.5 MG PO TABS: 0.5000 mg | ORAL_TABLET | Freq: Every evening | ORAL | Status: DC | PRN

## 2015-09-17 NOTE — Telephone Encounter (Signed)
See prior response.

## 2015-09-17 NOTE — Telephone Encounter (Signed)
Pt is requesting a rx for Xanax.

## 2015-09-19 ENCOUNTER — Other Ambulatory Visit: Payer: Self-pay | Admitting: Internal Medicine

## 2015-09-19 MED ORDER — ALPRAZOLAM 0.5 MG PO TABS: 0.5000 mg | ORAL_TABLET | Freq: Every evening | ORAL | Status: DC | PRN

## 2015-12-11 ENCOUNTER — Ambulatory Visit: Payer: Self-pay | Admitting: Internal Medicine

## 2015-12-12 ENCOUNTER — Ambulatory Visit (INDEPENDENT_AMBULATORY_CARE_PROVIDER_SITE_OTHER): Payer: BLUE CROSS/BLUE SHIELD | Admitting: Internal Medicine

## 2015-12-12 ENCOUNTER — Encounter: Payer: Self-pay | Admitting: Internal Medicine

## 2015-12-12 VITALS — BP 126/82 | HR 85 | Temp 98.9°F | Resp 14 | Ht 71.0 in | Wt 250.2 lb

## 2015-12-12 DIAGNOSIS — J0100 Acute maxillary sinusitis, unspecified: Secondary | ICD-10-CM | POA: Diagnosis not present

## 2015-12-12 DIAGNOSIS — Z79899 Other long term (current) drug therapy: Secondary | ICD-10-CM

## 2015-12-12 DIAGNOSIS — F411 Generalized anxiety disorder: Secondary | ICD-10-CM

## 2015-12-12 MED ORDER — HYDROCOD POLST-CPM POLST ER 10-8 MG/5ML PO SUER: 5.0000 mL | Freq: Every evening | ORAL | Status: DC | PRN

## 2015-12-12 MED ORDER — AMOXICILLIN-POT CLAVULANATE 875-125 MG PO TABS: 1.0000 | ORAL_TABLET | Freq: Two times a day (BID) | ORAL | Status: DC

## 2015-12-12 MED ORDER — ALPRAZOLAM 0.5 MG PO TABS: 0.5000 mg | ORAL_TABLET | Freq: Two times a day (BID) | ORAL | Status: DC | PRN

## 2015-12-12 MED ORDER — BENZONATATE 200 MG PO CAPS: 200.0000 mg | ORAL_CAPSULE | Freq: Three times a day (TID) | ORAL | Status: DC | PRN

## 2015-12-12 NOTE — Patient Instructions (Signed)
You have a sinus/ear infection   .  I am prescribing an antibiotic   To manage the infectin and the inflammation in your ear/sinuses.   I also advise use of the following OTC meds to help with your other symptoms.   Take generic OTC benadryl 25 mg at bedtime for the  drainage,  Sudafed PE  10 to 30 mg every 8 hours for the congestion, you may substitute Afrin nasal spray for the nighttime dose of sudafed PE  If needed to prevent insomnia.  flushes your sinuses twice daily with Milta Deiters meds Sinus rinse  (do over the sink because if you do it right you will spit out globs of mucus)  Use benzonatate capsules   FOR THE COUGH.  Gargle with salt water as needed for sore throat.   Please take a probiotic ( Align, Floraque or Culturelle), the generic version of one of these over the counter medications, or an alternative form (kombucha,  Yogurt, or another dietary source) for a minimum of 3 weeks to prevent a serious antibiotic associated diarrhea  Called clostridium dificile colitis.  Taking a probiotic may also prevent vaginitis due to yeast infections and can be continued indefinitely if you feel that it improves your digestion or your elimination (bowels).

## 2015-12-12 NOTE — Progress Notes (Signed)
Subjective:  Patient ID: KHYIR SOWELLS, male    DOB: [birth year]  Age: 40 y.o. MRN: [id number]  CC: The primary encounter diagnosis was Controlled substance agreement signed. Diagnoses of Generalized anxiety disorder and Acute maxillary sinusitis, recurrence not specified were also pertinent to this visit.  HPI Rannie E Stinger presents for SINUS CONGESTION  symptoms began  A week ago with pressure across forehead,  Right ear and right teeth hurting   Sore throat.    Started diffusely congested,  Then locaoled to right side   Gastric sleeve feb 29th 2016.  weight loss has stopped and he has gained 19lbs since November   Insulin pump managed by Dr Rayetta Pigg 1285 Nov 2016 , over 3000 last month at [facility]   Dr [facility] his psychiatrist does not accept insurance  Cost is $300/visit. Needs me to manage zoloft and alprazolam .  0.5 mg bid zoloft 100 mg 1/2 tablet     Outpatient Prescriptions Prior to Visit  Medication Sig Dispense Refill  . aspirin EC 81 MG tablet Take 81 mg by mouth daily.      [name] gemfibrozil (LOPID) 600 MG tablet Take 1 tablet (600 mg total) by mouth 2 (two) times daily before a meal. 60 tablet 0  . insulin aspart (NOVOLOG) 100 UNIT/ML injection Inject 10 Units into the skin 3 (three) times daily with meals. 10 mL 11  . insulin glargine (LANTUS) 100 UNIT/ML injection Inject 0.5 mLs (50 Units total) into the skin daily. 10 mL 11  . omega-3 acid ethyl esters (LOVAZA) 1 G capsule Take 2 capsules (2 g total) by mouth 2 (two) times daily. 120 capsule 0  . rosuvastatin (CRESTOR) 10 MG tablet Take 1 tablet (10 mg total) by mouth daily at 6 PM. 30 tablet 0  . sertraline (ZOLOFT) 50 MG tablet Take 50 mg by mouth daily.    [name] ALPRAZolam (XANAX) 0.5 MG tablet Take 1 tablet (0.5 mg total) by mouth at bedtime as needed for anxiety. 15 tablet 0   No facility-administered medications prior to visit.    Review of Systems;  Patient denies headache, fevers, malaise, unintentional weight  loss, skin rash, eye pain, sinus congestion and sinus pain, sore throat, dysphagia,  hemoptysis , cough, dyspnea, wheezing, chest pain, palpitations, orthopnea, edema, abdominal pain, nausea, melena, diarrhea, constipation, flank pain, dysuria, hematuria, urinary  Frequency, nocturia, numbness, tingling, seizures,  Focal weakness, Loss of consciousness,  Tremor, insomnia, depression, anxiety, and suicidal ideation.      Objective:  BP 126/82 mmHg  Pulse 85  Temp(Src) 98.9 F (37.2 C) (Oral)  Resp 14  Ht 5\' 11"  (1.803 m)  Wt 250 lb 3.2 oz (113.49 kg)  BMI 34.91 kg/m2  SpO2 96%  BP Readings from Last 3 Encounters:  12/12/15 126/82  08/22/15 120/90  08/15/15 124/77    Wt Readings from Last 3 Encounters:  12/12/15 250 lb 3.2 oz (113.49 kg)  08/22/15 231 lb 6.4 oz (104.962 kg)  08/15/15 230 lb 6.4 oz (104.509 kg)    HEENT: Head: Normocephalic, without obvious abnormality, atraumatic, sinuses tender to percussion Eyes: conjunctivae/corneas clear. PERRL, EOM's intact. Fundi benign. Ears: right TM erythematous with  wth effusion  and external ear canals both ears Nose: Nares normal. Septum midline. Mucosa injected and red . Maxillary  sinus tenderness bilateral, no crusting or bleeding points Throat: lips, mucosa, and tongue normal; teeth and gums normal  Back: symmetric, no curvature. ROM normal. No CVA tenderness. Lungs: clear to auscultation  bilaterally Heart: regular rate and rhythm, S1, S2 normal, no murmur, click, rub or gallop Abdomen: soft, non-tender; bowel sounds normal; no masses,  no organomegaly Pulses: 2+ and symmetric Skin: Skin color, texture, turgor normal. No rashes or lesions Lymph nodes: Cervical, supraclavicular, and axillary nodes normal.  Lab Results  Component Value Date   HGBA1C 11.7* 08/04/2015   HGBA1C 9.0* 09/04/2014   HGBA1C 9.6* 07/26/2014    Lab Results  Component Value Date   CREATININE 0.86 08/12/2015   CREATININE 0.79 08/11/2015    CREATININE 0.84 08/10/2015    Lab Results  Component Value Date   WBC 3.8 08/12/2015   HGB 13.5 08/12/2015   HCT 39.9* 08/12/2015   PLT 151 08/12/2015   GLUCOSE 105* 08/12/2015   CHOL 718* 08/01/2015   TRIG 1285* 08/14/2015   HDL UNABLE TO PERFORM DUE TO LIPEMIC INTERFERENCE 08/01/2015   LDLDIRECT 111.4 06/30/2012   LDLCALC SEE COMMENTS 08/01/2015   ALT UNABLE TO REPORT DUE TO LIPEMIC INTERFERENCE 08/01/2015   AST UNABLE TO REPORT DUE TO LIPEMIC INTERFERENCE 08/01/2015   NA UNABLE TO REPORT DUE TO LIPEMIC INTERFERENCE 08/12/2015   K 3.9 08/12/2015   CL 99* 08/12/2015   CREATININE 0.86 08/12/2015   BUN 6 08/12/2015   CO2 27 08/12/2015   INR 1.0 11/04/2014   HGBA1C 11.7* 08/04/2015    No results found.  Assessment & Plan:   Problem List Items Addressed This Visit    Generalized anxiety disorder    He has asked if I will assume management of his GAD because his psychiatrist is costing him $300/session.  I will assume management of stable condition. Controlled substance contract signed.        Sinusitis, acute maxillary    Given chronicity of symptoms, development of facial pain and exam consistent with bacterial URI,  Will treat with empiric antibiotics, decongestants, and saline lavage.        Relevant Medications   amoxicillin-clavulanate (AUGMENTIN) 875-125 MG tablet   benzonatate (TESSALON) 200 MG capsule    Other Visit Diagnoses    Controlled substance agreement signed    -  Primary    Relevant Orders    POCT Urine Drug Screen       I have discontinued Mr. Michalczyk chlorpheniramine-HYDROcodone. I am also having him start on amoxicillin-clavulanate and benzonatate. Additionally, I am having him maintain his aspirin EC, sertraline, gemfibrozil, omega-3 acid ethyl esters, rosuvastatin, insulin aspart, insulin glargine, and ALPRAZolam.  Meds ordered this encounter  Medications  . amoxicillin-clavulanate (AUGMENTIN) 875-125 MG tablet    Sig: Take 1 tablet by mouth  2 (two) times daily.    Dispense:  14 tablet    Refill:  0  . DISCONTD: chlorpheniramine-HYDROcodone (TUSSIONEX PENNKINETIC ER) 10-8 MG/5ML SUER    Sig: Take 5 mLs by mouth at bedtime as needed for cough.    Dispense:  140 mL    Refill:  0  . DISCONTD: ALPRAZolam (XANAX) 0.5 MG tablet    Sig: Take 1 tablet (0.5 mg total) by mouth 2 (two) times daily as needed for anxiety.    Dispense:  60 tablet    Refill:  5  . benzonatate (TESSALON) 200 MG capsule    Sig: Take 1 capsule (200 mg total) by mouth 3 (three) times daily as needed for cough.    Dispense:  60 capsule    Refill:  1  . DISCONTD: ALPRAZolam (XANAX) 0.5 MG tablet    Sig: Take 1 tablet (0.5 mg total) by  mouth 2 (two) times daily as needed for anxiety.    Dispense:  60 tablet    Refill:  5  . ALPRAZolam (XANAX) 0.5 MG tablet    Sig: Take 1 tablet (0.5 mg total) by mouth 2 (two) times daily as needed for anxiety.    Dispense:  60 tablet    Refill:  0    Medications Discontinued During This Encounter  Medication Reason  . ALPRAZolam (XANAX) 0.5 MG tablet Reorder  . chlorpheniramine-HYDROcodone (TUSSIONEX PENNKINETIC ER) 10-8 MG/5ML SUER   . ALPRAZolam (XANAX) 0.5 MG tablet Reorder  . ALPRAZolam (XANAX) 0.5 MG tablet Reorder    Follow-up: No Follow-up on file.   Crecencio Mc, MD

## 2015-12-12 NOTE — Progress Notes (Signed)
Pre visit review using our clinic review tool, if applicable. No additional management support is needed unless otherwise documented below in the visit note. 

## 2015-12-13 DIAGNOSIS — J0100 Acute maxillary sinusitis, unspecified: Secondary | ICD-10-CM | POA: Insufficient documentation

## 2015-12-13 NOTE — Assessment & Plan Note (Signed)
Given chronicity of symptoms, development of facial pain and exam consistent with bacterial URI,  Will treat with empiric antibiotics, decongestants, and saline lavage.   

## 2015-12-13 NOTE — Assessment & Plan Note (Signed)
He has asked if I will assume management of his GAD because his psychiatrist is costing him $300/session.  I will assume management of stable condition. Controlled substance contract signed.

## 2015-12-19 LAB — HM DIABETES EYE EXAM

## 2016-01-16 DIAGNOSIS — Z9884 Bariatric surgery status: Secondary | ICD-10-CM

## 2016-01-16 DIAGNOSIS — Z6836 Body mass index (BMI) 36.0-36.9, adult: Secondary | ICD-10-CM

## 2016-01-16 DIAGNOSIS — E871 Hypo-osmolality and hyponatremia: Secondary | ICD-10-CM | POA: Diagnosis present

## 2016-01-16 DIAGNOSIS — E876 Hypokalemia: Secondary | ICD-10-CM | POA: Diagnosis present

## 2016-01-16 DIAGNOSIS — R339 Retention of urine, unspecified: Secondary | ICD-10-CM | POA: Diagnosis present

## 2016-01-16 DIAGNOSIS — N312 Flaccid neuropathic bladder, not elsewhere classified: Secondary | ICD-10-CM | POA: Diagnosis present

## 2016-01-16 DIAGNOSIS — E781 Pure hyperglyceridemia: Secondary | ICD-10-CM | POA: Diagnosis present

## 2016-01-16 DIAGNOSIS — E669 Obesity, unspecified: Secondary | ICD-10-CM | POA: Diagnosis present

## 2016-01-16 DIAGNOSIS — F329 Major depressive disorder, single episode, unspecified: Secondary | ICD-10-CM | POA: Diagnosis present

## 2016-01-16 DIAGNOSIS — F419 Anxiety disorder, unspecified: Secondary | ICD-10-CM | POA: Diagnosis present

## 2016-01-16 DIAGNOSIS — I10 Essential (primary) hypertension: Secondary | ICD-10-CM | POA: Diagnosis present

## 2016-01-16 DIAGNOSIS — K859 Acute pancreatitis without necrosis or infection, unspecified: Secondary | ICD-10-CM | POA: Diagnosis not present

## 2016-01-16 DIAGNOSIS — E1165 Type 2 diabetes mellitus with hyperglycemia: Secondary | ICD-10-CM | POA: Diagnosis present

## 2016-01-16 DIAGNOSIS — Z91041 Radiographic dye allergy status: Secondary | ICD-10-CM

## 2016-01-16 DIAGNOSIS — Z9641 Presence of insulin pump (external) (internal): Secondary | ICD-10-CM | POA: Diagnosis present

## 2016-01-16 DIAGNOSIS — E1142 Type 2 diabetes mellitus with diabetic polyneuropathy: Secondary | ICD-10-CM | POA: Diagnosis present

## 2016-01-16 DIAGNOSIS — Z7982 Long term (current) use of aspirin: Secondary | ICD-10-CM

## 2016-01-16 DIAGNOSIS — Z794 Long term (current) use of insulin: Secondary | ICD-10-CM

## 2016-01-16 DIAGNOSIS — K861 Other chronic pancreatitis: Secondary | ICD-10-CM | POA: Diagnosis present

## 2016-01-16 DIAGNOSIS — G4733 Obstructive sleep apnea (adult) (pediatric): Secondary | ICD-10-CM | POA: Diagnosis present

## 2016-01-16 DIAGNOSIS — E785 Hyperlipidemia, unspecified: Secondary | ICD-10-CM | POA: Diagnosis present

## 2016-01-16 MED ORDER — ONDANSETRON 4 MG PO TBDP
4.0000 mg | ORAL_TABLET | Freq: Once | ORAL | Status: AC | PRN
Start: 2016-01-16 — End: 2016-01-16
  Administered 2016-01-16: 4 mg via ORAL
  Filled 2016-01-16: qty 1

## 2016-01-16 NOTE — ED Notes (Signed)
Pt ambulatory to triage with no difficulty. Pt reports hx of pancreatitis and today developed upper abd pain and nausea.

## 2016-01-16 NOTE — ED Notes (Signed)
Pt unable to given urine specimen.given cup and instructed to bring it to the front desk when did.

## 2016-01-17 ENCOUNTER — Inpatient Hospital Stay
Admission: EM | Admit: 2016-01-17 | Discharge: 2016-01-27 | DRG: 439 | Disposition: A | Discharge status: Home / Self Care | Payer: BLUE CROSS/BLUE SHIELD | Attending: Internal Medicine | Admitting: Internal Medicine

## 2016-01-17 ENCOUNTER — Encounter: Payer: Self-pay | Admitting: Emergency Medicine

## 2016-01-17 DIAGNOSIS — Z9641 Presence of insulin pump (external) (internal): Secondary | ICD-10-CM | POA: Diagnosis present

## 2016-01-17 DIAGNOSIS — E871 Hypo-osmolality and hyponatremia: Secondary | ICD-10-CM | POA: Diagnosis present

## 2016-01-17 DIAGNOSIS — K861 Other chronic pancreatitis: Secondary | ICD-10-CM | POA: Diagnosis present

## 2016-01-17 DIAGNOSIS — E669 Obesity, unspecified: Secondary | ICD-10-CM | POA: Diagnosis present

## 2016-01-17 DIAGNOSIS — N312 Flaccid neuropathic bladder, not elsewhere classified: Secondary | ICD-10-CM | POA: Diagnosis present

## 2016-01-17 DIAGNOSIS — E876 Hypokalemia: Secondary | ICD-10-CM | POA: Diagnosis present

## 2016-01-17 DIAGNOSIS — E785 Hyperlipidemia, unspecified: Secondary | ICD-10-CM | POA: Diagnosis present

## 2016-01-17 DIAGNOSIS — Z7982 Long term (current) use of aspirin: Secondary | ICD-10-CM | POA: Diagnosis not present

## 2016-01-17 DIAGNOSIS — R109 Unspecified abdominal pain: Secondary | ICD-10-CM | POA: Diagnosis present

## 2016-01-17 DIAGNOSIS — Z9884 Bariatric surgery status: Secondary | ICD-10-CM | POA: Diagnosis not present

## 2016-01-17 DIAGNOSIS — Z6836 Body mass index (BMI) 36.0-36.9, adult: Secondary | ICD-10-CM | POA: Diagnosis not present

## 2016-01-17 DIAGNOSIS — E1165 Type 2 diabetes mellitus with hyperglycemia: Secondary | ICD-10-CM | POA: Diagnosis present

## 2016-01-17 DIAGNOSIS — E781 Pure hyperglyceridemia: Secondary | ICD-10-CM | POA: Diagnosis present

## 2016-01-17 DIAGNOSIS — I10 Essential (primary) hypertension: Secondary | ICD-10-CM | POA: Diagnosis present

## 2016-01-17 DIAGNOSIS — G4733 Obstructive sleep apnea (adult) (pediatric): Secondary | ICD-10-CM | POA: Diagnosis present

## 2016-01-17 DIAGNOSIS — R338 Other retention of urine: Secondary | ICD-10-CM

## 2016-01-17 DIAGNOSIS — Z794 Long term (current) use of insulin: Secondary | ICD-10-CM | POA: Diagnosis not present

## 2016-01-17 DIAGNOSIS — F419 Anxiety disorder, unspecified: Secondary | ICD-10-CM | POA: Diagnosis present

## 2016-01-17 DIAGNOSIS — R339 Retention of urine, unspecified: Secondary | ICD-10-CM | POA: Diagnosis present

## 2016-01-17 DIAGNOSIS — E1142 Type 2 diabetes mellitus with diabetic polyneuropathy: Secondary | ICD-10-CM | POA: Diagnosis present

## 2016-01-17 DIAGNOSIS — N319 Neuromuscular dysfunction of bladder, unspecified: Secondary | ICD-10-CM

## 2016-01-17 DIAGNOSIS — K859 Acute pancreatitis without necrosis or infection, unspecified: Secondary | ICD-10-CM | POA: Diagnosis present

## 2016-01-17 DIAGNOSIS — F329 Major depressive disorder, single episode, unspecified: Secondary | ICD-10-CM | POA: Diagnosis present

## 2016-01-17 DIAGNOSIS — Z91041 Radiographic dye allergy status: Secondary | ICD-10-CM | POA: Diagnosis not present

## 2016-01-17 HISTORY — DX: Depression, unspecified: F32.A

## 2016-01-17 HISTORY — DX: Major depressive disorder, single episode, unspecified: F32.9

## 2016-01-17 HISTORY — DX: Anxiety disorder, unspecified: F41.9

## 2016-01-17 LAB — URINALYSIS COMPLETE WITH MICROSCOPIC (ARMC ONLY)
Bacteria, UA: NONE SEEN
Bilirubin Urine: NEGATIVE
Glucose, UA: 50 mg/dL — AB
Ketones, ur: NEGATIVE mg/dL
Leukocytes, UA: NEGATIVE
Nitrite: NEGATIVE
Protein, ur: 30 mg/dL — AB
Specific Gravity, Urine: 1.019 (ref 1.005–1.030)
pH: 5 (ref 5.0–8.0)

## 2016-01-17 LAB — BASIC METABOLIC PANEL
ANION GAP: 8 (ref 5–15)
Anion gap: 9 (ref 5–15)
BUN: 10 mg/dL (ref 6–20)
CALCIUM: 8 mg/dL — AB (ref 8.9–10.3)
CALCIUM: 7.8 mg/dL — AB (ref 8.9–10.3)
CO2: 26 mmol/L (ref 22–32)
CO2: 24 mmol/L (ref 22–32)
Chloride: 89 mmol/L — ABNORMAL LOW (ref 101–111)
Chloride: 88 mmol/L — ABNORMAL LOW (ref 101–111)
Creatinine, Ser: <0.3 mg/dL — ABNORMAL LOW (ref 0.61–1.24)
Creatinine, Ser: 0.7 mg/dL (ref 0.61–1.24)
GFR calc Af Amer: >60 mL/min (ref >60)
GLUCOSE: 89 mg/dL (ref 65–99)
GLUCOSE: 89 mg/dL (ref 65–99)
Potassium: 2.8 mmol/L — CL (ref 3.5–5.1)
Potassium: 3.1 mmol/L — ABNORMAL LOW (ref 3.5–5.1)
Sodium: 123 mmol/L — ABNORMAL LOW (ref 135–145)
Sodium: 121 mmol/L — ABNORMAL LOW (ref 135–145)

## 2016-01-17 LAB — CBC
HCT: 39.3 % — ABNORMAL LOW (ref 40.0–52.0)
Hemoglobin: 12.5 g/dL — ABNORMAL LOW (ref 13.0–18.0)
MCH: 24.8 pg — AB (ref 26.0–34.0)
MCHC: 31.8 g/dL — AB (ref 32.0–36.0)
MCV: 78 fL — AB (ref 80.0–100.0)
PLATELETS: 257 10*3/uL (ref 150–440)
RBC: 5.03 MIL/uL (ref 4.40–5.90)
RDW: 15.9 % — AB (ref 11.5–14.5)
WBC: 7.1 10*3/uL (ref 3.8–10.6)

## 2016-01-17 LAB — COMPREHENSIVE METABOLIC PANEL
ALT: UNDETERMINED U/L (ref 17–63)
AST: UNDETERMINED U/L (ref 15–41)
Albumin: 5.3 g/dL — ABNORMAL HIGH (ref 3.5–5.0)
Alkaline Phosphatase: 45 U/L (ref 38–126)
Anion gap: 10 (ref 5–15)
BUN: UNDETERMINED mg/dL (ref 6–20)
CHLORIDE: 83 mmol/L — AB (ref 101–111)
CO2: 23 mmol/L (ref 22–32)
CREATININE: UNDETERMINED mg/dL (ref 0.61–1.24)
Calcium: 8.6 mg/dL — ABNORMAL LOW (ref 8.9–10.3)
GLUCOSE: 201 mg/dL — AB (ref 65–99)
Potassium: 3 mmol/L — ABNORMAL LOW (ref 3.5–5.1)
SODIUM: 116 mmol/L — AB (ref 135–145)
Total Bilirubin: <0.1 mg/dL — ABNORMAL LOW (ref 0.3–1.2)
Total Protein: 4.5 g/dL — ABNORMAL LOW (ref 6.5–8.1)

## 2016-01-17 LAB — TRIGLYCERIDES
Triglycerides: >5000 mg/dL — ABNORMAL HIGH (ref <150)
Triglycerides: >5000 mg/dL — ABNORMAL HIGH (ref <150)

## 2016-01-17 LAB — LIPID PANEL
CHOLESTEROL: 969 mg/dL — AB (ref 0–200)
HDL: 21 mg/dL — ABNORMAL LOW (ref >40)
LDL Cholesterol: UNDETERMINED mg/dL (ref 0–99)
VLDL: UNDETERMINED mg/dL (ref 0–40)

## 2016-01-17 LAB — GLUCOSE, CAPILLARY
Glucose-Capillary: 110 mg/dL — ABNORMAL HIGH (ref 65–99)
Glucose-Capillary: 117 mg/dL — ABNORMAL HIGH (ref 65–99)
Glucose-Capillary: 120 mg/dL — ABNORMAL HIGH (ref 65–99)
Glucose-Capillary: 149 mg/dL — ABNORMAL HIGH (ref 65–99)
Glucose-Capillary: 164 mg/dL — ABNORMAL HIGH (ref 65–99)
Glucose-Capillary: 207 mg/dL — ABNORMAL HIGH (ref 65–99)
Glucose-Capillary: 80 mg/dL (ref 65–99)
Glucose-Capillary: 84 mg/dL (ref 65–99)
Glucose-Capillary: 85 mg/dL (ref 65–99)
Glucose-Capillary: 86 mg/dL (ref 65–99)
Glucose-Capillary: 86 mg/dL (ref 65–99)
Glucose-Capillary: 89 mg/dL (ref 65–99)
Glucose-Capillary: 92 mg/dL (ref 65–99)
Glucose-Capillary: 97 mg/dL (ref 65–99)
Glucose-Capillary: 98 mg/dL (ref 65–99)

## 2016-01-17 LAB — HEMOGLOBIN A1C: Hgb A1c MFr Bld: 9.7 % — ABNORMAL HIGH (ref 4.0–6.0)

## 2016-01-17 LAB — LIPASE, BLOOD: LIPASE: 45 U/L (ref 11–51)

## 2016-01-17 LAB — TSH: TSH: 4.629 u[IU]/mL — ABNORMAL HIGH (ref 0.350–4.500)

## 2016-01-17 LAB — MRSA PCR SCREENING: MRSA BY PCR: NEGATIVE

## 2016-01-17 MED ORDER — POTASSIUM CHLORIDE 10 MEQ/100ML IV SOLN
10.0000 meq | INTRAVENOUS | Status: AC
  Administered 2016-01-17 (×4): 10 meq via INTRAVENOUS
  Filled 2016-01-17 (×4): qty 100

## 2016-01-17 MED ORDER — SERTRALINE HCL 50 MG PO TABS
50.0000 mg | ORAL_TABLET | Freq: Every day | ORAL | Status: DC
  Administered 2016-01-17 – 2016-01-27 (×11): 50 mg via ORAL
  Filled 2016-01-17 (×11): qty 1

## 2016-01-17 MED ORDER — HYDROMORPHONE HCL 1 MG/ML IJ SOLN
2.0000 mg | Freq: Once | INTRAMUSCULAR | Status: AC
  Administered 2016-01-17: 2 mg via INTRAVENOUS
  Filled 2016-01-17: qty 2

## 2016-01-17 MED ORDER — OMEGA-3-ACID ETHYL ESTERS 1 G PO CAPS: 4.0000 g | ORAL_CAPSULE | Freq: Two times a day (BID) | ORAL | Status: DC

## 2016-01-17 MED ORDER — SODIUM CHLORIDE 0.9 % IV BOLUS (SEPSIS)
1000.0000 mL | Freq: Once | INTRAVENOUS | Status: AC
  Administered 2016-01-17: 1000 mL via INTRAVENOUS

## 2016-01-17 MED ORDER — PREGABALIN 75 MG PO CAPS
300.0000 mg | ORAL_CAPSULE | Freq: Every day | ORAL | Status: DC
  Administered 2016-01-17 – 2016-01-26 (×11): 300 mg via ORAL
  Filled 2016-01-17 (×3): qty 4
  Filled 2016-01-17 (×7): qty 6
  Filled 2016-01-17: qty 4

## 2016-01-17 MED ORDER — ACETAMINOPHEN 650 MG RE SUPP: 650.0000 mg | Freq: Four times a day (QID) | RECTAL | Status: DC | PRN

## 2016-01-17 MED ORDER — GEMFIBROZIL 600 MG PO TABS
600.0000 mg | ORAL_TABLET | Freq: Two times a day (BID) | ORAL | Status: DC
  Administered 2016-01-17 – 2016-01-27 (×21): 600 mg via ORAL
  Filled 2016-01-17 (×23): qty 1

## 2016-01-17 MED ORDER — ALPRAZOLAM 0.5 MG PO TABS
0.5000 mg | ORAL_TABLET | Freq: Two times a day (BID) | ORAL | Status: DC | PRN
  Administered 2016-01-17 – 2016-01-22 (×7): 0.5 mg via ORAL
  Filled 2016-01-17 (×7): qty 1

## 2016-01-17 MED ORDER — ENOXAPARIN SODIUM 40 MG/0.4ML ~~LOC~~ SOLN
40.0000 mg | SUBCUTANEOUS | Status: DC
  Administered 2016-01-17 – 2016-01-18 (×2): 40 mg via SUBCUTANEOUS
  Filled 2016-01-17 (×2): qty 0.4

## 2016-01-17 MED ORDER — ONDANSETRON HCL 4 MG/2ML IJ SOLN
4.0000 mg | Freq: Four times a day (QID) | INTRAMUSCULAR | Status: DC | PRN
  Administered 2016-01-17 – 2016-01-26 (×14): 4 mg via INTRAVENOUS
  Filled 2016-01-17 (×15): qty 2

## 2016-01-17 MED ORDER — ONDANSETRON HCL 4 MG PO TABS: 4.0000 mg | ORAL_TABLET | Freq: Four times a day (QID) | ORAL | Status: DC | PRN

## 2016-01-17 MED ORDER — OMEGA-3-ACID ETHYL ESTERS 1 G PO CAPS
4.0000 g | ORAL_CAPSULE | Freq: Every day | ORAL | Status: DC
  Administered 2016-01-17 – 2016-01-26 (×10): 4 g via ORAL
  Filled 2016-01-17 (×10): qty 4

## 2016-01-17 MED ORDER — DEXTROSE 5 % IV SOLN
INTRAVENOUS | Status: DC
  Administered 2016-01-17: 10:00:00 via INTRAVENOUS

## 2016-01-17 MED ORDER — POTASSIUM CL IN DEXTROSE 5% 20 MEQ/L IV SOLN
20.0000 meq | INTRAVENOUS | Status: DC
  Administered 2016-01-17: 20 meq via INTRAVENOUS
  Filled 2016-01-17 (×3): qty 1000

## 2016-01-17 MED ORDER — SODIUM CHLORIDE 4 MEQ/ML IV SOLN
INTRAVENOUS | Status: DC
  Administered 2016-01-17 – 2016-01-19 (×3): via INTRAVENOUS
  Filled 2016-01-17 (×8): qty 1000

## 2016-01-17 MED ORDER — SODIUM CHLORIDE 0.9 % IV SOLN
INTRAVENOUS | Status: DC
  Administered 2016-01-17 (×2): via INTRAVENOUS

## 2016-01-17 MED ORDER — OMEGA-3-ACID ETHYL ESTERS 1 G PO CAPS
2.0000 g | ORAL_CAPSULE | Freq: Two times a day (BID) | ORAL | Status: DC
  Administered 2016-01-17: 03:00:00 2 g via ORAL
  Filled 2016-01-17: qty 2

## 2016-01-17 MED ORDER — HYDROMORPHONE HCL 1 MG/ML IJ SOLN
2.0000 mg | INTRAMUSCULAR | Status: DC | PRN
  Administered 2016-01-17 – 2016-01-26 (×55): 2 mg via INTRAVENOUS
  Filled 2016-01-17 (×16): qty 2
  Filled 2016-01-17: qty 1
  Filled 2016-01-17 (×7): qty 2
  Filled 2016-01-17: qty 1
  Filled 2016-01-17 (×31): qty 2

## 2016-01-17 MED ORDER — OMEGA-3-ACID ETHYL ESTERS 1 G PO CAPS
2.0000 g | ORAL_CAPSULE | Freq: Once | ORAL | Status: AC
  Administered 2016-01-17: 2 g via ORAL
  Filled 2016-01-17: qty 2

## 2016-01-17 MED ORDER — DOCUSATE SODIUM 100 MG PO CAPS
100.0000 mg | ORAL_CAPSULE | Freq: Two times a day (BID) | ORAL | Status: DC
  Administered 2016-01-19 – 2016-01-24 (×2): 100 mg via ORAL
  Filled 2016-01-17 (×13): qty 1

## 2016-01-17 MED ORDER — ACETAMINOPHEN 325 MG PO TABS
650.0000 mg | ORAL_TABLET | Freq: Four times a day (QID) | ORAL | Status: DC | PRN
  Administered 2016-01-19: 650 mg via ORAL
  Filled 2016-01-17: qty 2

## 2016-01-17 MED ORDER — SODIUM CHLORIDE 0.9% FLUSH
3.0000 mL | Freq: Two times a day (BID) | INTRAVENOUS | Status: DC
  Administered 2016-01-17 – 2016-01-18 (×3): 3 mL via INTRAVENOUS
  Administered 2016-01-19: 10 mL via INTRAVENOUS
  Administered 2016-01-19 – 2016-01-27 (×11): 3 mL via INTRAVENOUS

## 2016-01-17 MED ORDER — ROSUVASTATIN CALCIUM 10 MG PO TABS
10.0000 mg | ORAL_TABLET | Freq: Every day | ORAL | Status: DC
  Administered 2016-01-17 – 2016-01-18 (×2): 10 mg via ORAL
  Filled 2016-01-17 (×2): qty 1

## 2016-01-17 MED ORDER — INSULIN ASPART 100 UNIT/ML ~~LOC~~ SOLN: 0.0000 [IU] | Freq: Three times a day (TID) | SUBCUTANEOUS | Status: DC

## 2016-01-17 MED ORDER — POTASSIUM CHLORIDE 10 MEQ/100ML IV SOLN
10.0000 meq | INTRAVENOUS | Status: AC
Start: 2016-01-17 — End: 2016-01-18
  Administered 2016-01-17 – 2016-01-18 (×4): 10 meq via INTRAVENOUS
  Filled 2016-01-17 (×4): qty 100

## 2016-01-17 MED ORDER — PREGABALIN 75 MG PO CAPS: 300.0000 mg | ORAL_CAPSULE | Freq: Every day | ORAL | Status: DC

## 2016-01-17 MED ORDER — ONDANSETRON HCL 4 MG/2ML IJ SOLN
4.0000 mg | Freq: Once | INTRAMUSCULAR | Status: AC | PRN
  Administered 2016-01-17: 4 mg via INTRAVENOUS
  Filled 2016-01-17: qty 2

## 2016-01-17 MED ORDER — TAMSULOSIN HCL 0.4 MG PO CAPS
0.4000 mg | ORAL_CAPSULE | Freq: Every day | ORAL | Status: DC
  Administered 2016-01-17 – 2016-01-27 (×11): 0.4 mg via ORAL
  Filled 2016-01-17 (×11): qty 1

## 2016-01-17 MED ORDER — SODIUM CHLORIDE 0.9 % IV SOLN
12.0000 [IU]/h | INTRAVENOUS | Status: DC
  Administered 2016-01-17 – 2016-01-23 (×8): 12 [IU]/h via INTRAVENOUS
  Filled 2016-01-17 (×9): qty 2.5

## 2016-01-17 MED ORDER — INSULIN GLARGINE 100 UNIT/ML ~~LOC~~ SOLN
40.0000 [IU] | Freq: Every day | SUBCUTANEOUS | Status: DC
  Filled 2016-01-17: qty 0.4

## 2016-01-17 MED ORDER — TRAZODONE HCL 100 MG PO TABS
100.0000 mg | ORAL_TABLET | Freq: Every day | ORAL | Status: DC
  Administered 2016-01-19 – 2016-01-26 (×8): 100 mg via ORAL
  Filled 2016-01-17 (×10): qty 1

## 2016-01-17 NOTE — H&P (Signed)
Sergio [name] is an 40 y.o. male.   Chief Complaint: Abdominal pain HPI: The patient with past medical history of chronic pancreatitis secondary to familial hypertriglyceridemia presents emergency department complaining of upper epigastric pain. He states that it began approximately 6-8 hours prior to presentation. He denies vomiting but admits to nausea. He also denies fever, shortness of breath or chest pain. In the emergency department the patient was found to have a normal lipase but profound hyponatremia. Due to his abdominal pain is well as low-sodium emergency [facility] staff called for admission.  Past Medical History  Diagnosis Date  . Recurrent acute pancreatitis     secondary to hypertriglyceridemia   . Gastric ulcer 2009  . Sleep apnea 1999    uses CPAP  . Chicken pox   . Diabetes mellitus   . HTN (hypertension)   . Familial hypertriglyceridemia     severe  . Morbid obesity ([place])     s/p bariatric sleeve surgery 01/2015  . Anxiety   . Depression     Past Surgical History  Procedure Laterality Date  . Tonsillectomy  2003  . Vasectomy    . Lumbar disc surgery  2008  . Cholecystectomy    . Laparoscopic gastric sleeve resection      Family History  Problem Relation Age of Onset  . Adopted: Yes  . Hypertension Mother   . Hyperlipidemia Mother   . Hypertension Father   . Hyperlipidemia Father    Social History:  reports that he has never smoked. He has never used smokeless tobacco. He reports that he does not drink alcohol or use illicit drugs.  Allergies:  Allergies  Allergen Reactions  . Codeine Anaphylaxis  . Ivp Dye [Iodinated Diagnostic Agents] Other (See Comments)    Kidneys stop working    Medications Prior to Admission  Medication Sig Dispense Refill  . ALPRAZolam (XANAX) 0.5 MG tablet Take 1 tablet (0.5 mg total) by mouth 2 (two) times daily as needed for anxiety. 60 tablet 0  . gemfibrozil (LOPID) 600 MG tablet Take 1 tablet (600 mg total) by mouth 2  (two) times daily before a meal. 60 tablet 0  . insulin aspart (NOVOLOG) 100 UNIT/ML injection Inject 1 Dose into the skin See admin instructions. Use up to 120 units daily via insulin pump    . omega-3 acid ethyl esters (LOVAZA) 1 g capsule Take 4 g by mouth at bedtime.    . pregabalin (LYRICA) 300 MG capsule Take 300 mg by mouth at bedtime.     . rosuvastatin (CRESTOR) 10 MG tablet Take 1 tablet (10 mg total) by mouth daily at 6 PM. 30 tablet 0  . sertraline (ZOLOFT) 50 MG tablet Take 50 mg by mouth daily.    . traZODone (DESYREL) 100 MG tablet Take 100 mg by mouth at bedtime.      Results for orders placed or performed during the hospital encounter of 01/17/16 (from the past 48 hour(s))  Lipase, blood     Status: None   Collection Time: 01/16/16 11:10 PM  Result Value Ref Range   Lipase 45 11 - 51 U/L  Comprehensive metabolic panel     Status: Abnormal   Collection Time: 01/16/16 11:10 PM  Result Value Ref Range   Sodium 116 (LL) 135 - 145 mmol/L    Comment: CRITICAL RESULT CALLED TO, READ BACK BY AND VERIFIED WITH ANN CALES AT 0008 01/17/16.PMH   Potassium 3.0 (L) 3.5 - 5.1 mmol/L   Chloride 83 (  L) 101 - 111 mmol/L   CO2 23 22 - 32 mmol/L   Glucose, Bld 201 (H) 65 - 99 mg/dL   BUN UNABLE TO REPORT DUE TO LIPEMIC INTERFERENCE 6 - 20 mg/dL   Creatinine, Ser UNABLE TO REPORT DUE TO LIPEMIC INTERFERENCE 0.61 - 1.24 mg/dL   Calcium 8.6 (L) 8.9 - 10.3 mg/dL   Total Protein 4.5 (L) 6.5 - 8.1 g/dL   Albumin 5.3 (H) 3.5 - 5.0 g/dL   AST UNABLE TO PERFORM DUE TO LIPEMIC INTERFERENCE 15 - 41 U/L   ALT UNABLE TO PERFORM DUE TO LIPEMIC INTERFERENCE 17 - 63 U/L   Alkaline Phosphatase 45 38 - 126 U/L   Total Bilirubin <0.1 (L) 0.3 - 1.2 mg/dL   GFR calc non Af Amer NOT CALCULATED >60 mL/min   GFR calc Af Amer NOT CALCULATED >60 mL/min    Comment: (NOTE) The eGFR has been calculated using the CKD EPI equation. This calculation has not been validated in all clinical situations. eGFR's  persistently <60 mL/min signify possible Chronic Kidney Disease.    Anion gap 10 5 - 15  CBC     Status: Abnormal   Collection Time: 01/16/16 11:10 PM  Result Value Ref Range   WBC 7.1 3.8 - 10.6 K/uL   RBC 5.03 4.40 - 5.90 MIL/uL   Hemoglobin 12.5 (L) 13.0 - 18.0 g/dL    Comment: CORRECTED FOR LIPEMIA   HCT 39.3 (L) 40.0 - 52.0 %   MCV 78.0 (L) 80.0 - 100.0 fL   MCH 24.8 (L) 26.0 - 34.0 pg    Comment: CORRECTED FOR LIPEMIA   MCHC 31.8 (L) 32.0 - 36.0 g/dL    Comment: CORRECTED FOR LIPEMIA   RDW 15.9 (H) 11.5 - 14.5 %   Platelets 257 150 - 440 K/uL  Lipid panel     Status: Abnormal   Collection Time: 01/16/16 11:10 PM  Result Value Ref Range   Cholesterol 969 (H) 0 - 200 mg/dL   Triglycerides >5000 (H) <150 mg/dL    Comment: RESULTS CONFIRMED BY MANUAL DILUTION   HDL 21 (L) >40 mg/dL   Total CHOL/HDL Ratio NOT REPORTED DUE TO HIGH TRIGLYCERIDES RATIO   VLDL UNABLE TO CALCULATE IF TRIGLYCERIDE OVER 400 mg/dL 0 - 40 mg/dL   LDL Cholesterol UNABLE TO CALCULATE IF TRIGLYCERIDE OVER 400 mg/dL 0 - 99 mg/dL  TSH     Status: Abnormal   Collection Time: 01/16/16 11:10 PM  Result Value Ref Range   TSH 4.629 (H) 0.350 - 4.500 uIU/mL  Glucose, capillary     Status: Abnormal   Collection Time: 01/17/16 12:39 AM  Result Value Ref Range   Glucose-Capillary 207 (H) 65 - 99 mg/dL   No results found.  Review of Systems  Constitutional: Negative for fever and chills.  HENT: Negative for sore throat and tinnitus.   Eyes: Negative for blurred vision and redness.  Respiratory: Negative for cough and shortness of breath.   Cardiovascular: Negative for chest pain, palpitations, orthopnea and PND.  Gastrointestinal: Positive for abdominal pain. Negative for nausea, vomiting and diarrhea.  Genitourinary: Negative for dysuria, urgency and frequency.  Musculoskeletal: Negative for myalgias and joint pain.  Skin: Negative for rash.       No lesions  Neurological: Negative for speech change,  focal weakness and weakness.  Endo/Heme/Allergies: Does not bruise/bleed easily.       No temperature intolerance  Psychiatric/Behavioral: Negative for depression and suicidal ideas.    Blood pressure 140/93,  pulse 94, temperature 98.6 F (37 C), temperature source Oral, resp. rate 20, height 5' 10"  (1.778 m), weight 112.991 kg (249 lb 1.6 oz), SpO2 94 %. Physical Exam  Nursing note and vitals reviewed. Constitutional: He is oriented to person, place, and time. He appears well-developed and well-nourished. No distress.  HENT:  Head: Normocephalic and atraumatic.  Mouth/Throat: Oropharynx is clear and moist.  Eyes: Conjunctivae and EOM are normal. Pupils are equal, round, and reactive to light. No scleral icterus.  Neck: Normal range of motion. Neck supple. No JVD present. No tracheal deviation present. No thyromegaly present.  Cardiovascular: Normal rate, regular rhythm and normal heart sounds.  Exam reveals no gallop and no friction rub.   No murmur heard. Respiratory: Effort normal and breath sounds normal. No respiratory distress. He has no wheezes.  GI: Soft. Bowel sounds are normal. He exhibits no distension and no mass. There is tenderness. There is no rebound and no guarding.  Genitourinary:  Deferred  Musculoskeletal: Normal range of motion. He exhibits no edema.  Lymphadenopathy:    He has no cervical adenopathy.  Neurological: He is alert and oriented to person, place, and time. He has normal reflexes.  Skin: Skin is warm and dry. No rash noted. No erythema.  Psychiatric: He has a normal mood and affect. His behavior is normal. Judgment and thought content normal.     Assessment/Plan This is a 40 year old male admitted for chronic pancreatitis and hyponatremia. 1. Pancreatitis: Chronic; secondary to hypertriglyceridemia. At the time of admission the patient's lipid panel had not yet resulted. I had admitted him to the MedSurg floor and started basal insulin as well as fast  acting insulin. Once the patient's labs had resulted and concluded that he would benefit most from an insulin drip which will require him to move to stepdown status. Once a bed is available we will transfer the patient and start insulin therapy. He is nothing by mouth except for oral medications. Manage pain. 2. Hyponatremia: Pseudo-; secondary to hypertriglyceridemia. Hydrate with intravenous fluid 3. Diabetes mellitus type 2: The patient is on an insulin pump at home but has taken the insulin pump off at this time. Consider reprogramming insulin pump for a higher NovoLog dosing for a basal rate when the patient developed pancreatitis in the future. 4. Obstructive sleep apnea: CPAP per home settings 5. Familial hypertriglyceridemia: Continue gemfibrozil, Lovaza and Crestor 6. Depression: Continue Lyrica, Zoloft and trazodone 7. DVT prophylaxis: Lovenox 8. GI prophylaxis: None The patient is a full code. Time spent on admission orders and critical patient care approximately 45 minutes  Harrie Foreman, MD 01/17/2016, 4:24 AM

## 2016-01-17 NOTE — Progress Notes (Signed)
MEDICATION RELATED CONSULT NOTE - INITIAL   Pharmacy Consult for Insulin drip for hypertriglyceridemia   Allergies  Allergen Reactions  . Codeine Anaphylaxis  . Ivp Dye [Iodinated Diagnostic Agents] Other (See Comments)    Kidneys stop working    Patient Measurements: Height: 5\' 10"  (177.8 cm) Weight: 249 lb 1.6 oz (112.991 kg) IBW/kg (Calculated) : 73   Vital Signs: Temp: 98.4 F (36.9 C) (04/08 0800) Temp Source: Oral (04/08 0722) BP: 133/96 mmHg (04/08 0800) Pulse Rate: 78 (04/08 0800)  Lipid Panel     Component Value Date/Time   CHOL 969* 01/16/2016 2310   CHOL 357* 09/04/2014 0328   CHOL 1116* 04/27/2012 0939   TRIG >5000* 01/16/2016 2310   TRIG 1201* 11/04/2014 0427   HDL 21* 01/16/2016 2310   HDL SEE COMMENT 09/04/2014 0328   HDL 60 04/27/2012 0939   CHOLHDL NOT REPORTED DUE TO HIGH TRIGLYCERIDES 01/16/2016 2310   CHOLHDL 18.6* 04/27/2012 0939   VLDL UNABLE TO CALCULATE IF TRIGLYCERIDE OVER 400 mg/dL 01/16/2016 2310   VLDL SEE COMMENT 09/04/2014 0328   LDLCALC UNABLE TO CALCULATE IF TRIGLYCERIDE OVER 400 mg/dL 01/16/2016 2310   LDLCALC SEE COMMENT 09/04/2014 0328   LDLCALC Comment 04/27/2012 0939   LDLDIRECT 111.4 06/30/2012 0950   Medical History: Past Medical History  Diagnosis Date  . Recurrent acute pancreatitis     secondary to hypertriglyceridemia   . Gastric ulcer 2009  . Sleep apnea 1999    uses CPAP  . Chicken pox   . Diabetes mellitus   . HTN (hypertension)   . Familial hypertriglyceridemia     severe  . Morbid obesity ([place])     s/p bariatric sleeve surgery 01/2015  . Anxiety   . Depression    Assessment: 40 yo male with chronic pancreatitis secondary to familial hypertriglyceridemia.  Pharmacy was consulted for initiation of insulin drip for  Hypertriglyceridemia.  Goal of Therapy:  Discontinue drip when Triglyceridemia < [phone]  Plan:  After discussion with Dr. Tressia Miners insulin infusion will be  started at 12 units/hour along  with D5W infusion at 61ml/hour. CBGs every hour and orders for when to notify MD have been entered.  BMP scheduled to 1400 today and tomorrow AM at 0500. Triglycerides ordered every 8 hours.   Nancy Fetter, PharmD Pharmacy Resident  01/17/2016,10:07 AM

## 2016-01-17 NOTE — Progress Notes (Signed)
Patient tried to void and unable therefore RN bladder scanned patient with greatest result 429 ml.  Patient stated "I dont want a catheter, I will try to pee again in a little bit." RN will continue to monitor.

## 2016-01-17 NOTE — ED Notes (Signed)
Dr. Diamond at bedside, transport delayed 

## 2016-01-17 NOTE — Progress Notes (Signed)
Bladder scan result 773ml at 1710.  Patient voided 225cc at 1800.  This RN is leaving therefore shift change report given to Adin Hector, RN who will notify MD.

## 2016-01-17 NOTE — Progress Notes (Signed)
RN spoke with Dr. Tressia Miners on phone and asked MD about ordering insulin drip for patient.  MD acknowledged.

## 2016-01-17 NOTE — Progress Notes (Signed)
[facility] NAME: Sergio [name]    MR#:  [id number]  DATE OF BIRTH:  [birth year]  SUBJECTIVE:  CHIEF COMPLAINT:   Chief Complaint  Patient presents with  . Abdominal Pain   -Admitted for pancreatitis related to hypertriglyceridemia. Triglycerides greater than 5000. -Still complains of nausea and vomiting. Also has abdominal pain. -No diarrhea  REVIEW OF SYSTEMS:  Review of Systems  Constitutional: Positive for malaise/fatigue. Negative for fever and chills.  HENT: Negative for ear discharge, ear pain and nosebleeds.   Eyes: Negative for blurred vision, double vision and photophobia.  Respiratory: Negative for cough, shortness of breath and wheezing.   Cardiovascular: Negative for chest pain and palpitations.  Gastrointestinal: Positive for nausea, vomiting and abdominal pain. Negative for diarrhea and constipation.  Genitourinary: Negative for dysuria and urgency.  Musculoskeletal: Negative for myalgias and neck pain.  Neurological: Negative for dizziness, sensory change, speech change, focal weakness, seizures and headaches.  Psychiatric/Behavioral: Negative for depression.    DRUG ALLERGIES:   Allergies  Allergen Reactions  . Codeine Anaphylaxis  . Ivp Dye [Iodinated Diagnostic Agents] Other (See Comments)    Kidneys stop working    VITALS:  Blood pressure 141/81, pulse 69, temperature 98.4 F (36.9 C), temperature source Oral, resp. rate 10, height 5\' 10"  (1.778 m), weight 112.991 kg (249 lb 1.6 oz), SpO2 96 %.  PHYSICAL EXAMINATION:  Physical Exam  GENERAL:  40 y.o.-year-old patient lying in the bed with minimal distress due to abdominal pain.  EYES: Pupils equal, round, reactive to light and accommodation. No scleral icterus. Extraocular muscles intact.  HEENT: Head atraumatic, normocephalic. Oropharynx and nasopharynx clear.  NECK:  Supple, no jugular venous distention. No thyroid enlargement, no  tenderness.  LUNGS: Normal breath sounds bilaterally, no wheezing, rales,rhonchi or crepitation. No use of accessory muscles of respiration.  CARDIOVASCULAR: S1, S2 normal. No murmurs, rubs, or gallops.  ABDOMEN: Soft, but tender to palpation in the epigastric and left upper quadrant areas, nondistended. Bowel sounds present. No organomegaly or mass.  EXTREMITIES: No pedal edema, cyanosis, or clubbing.  NEUROLOGIC: Cranial nerves II through XII are intact. Muscle strength 5/5 in all extremities. Sensation intact. Gait not checked.  PSYCHIATRIC: The patient is alert and oriented x 3.  SKIN: No obvious rash, lesion, or ulcer.    LABORATORY PANEL:   CBC  Recent Labs Lab 01/16/16 2310  WBC 7.1  HGB 12.5*  HCT 39.3*  PLT 257   ------------------------------------------------------------------------------------------------------------------  Chemistries   Recent Labs Lab 01/16/16 2310  NA 116*  K 3.0*  CL 83*  CO2 23  GLUCOSE 201*  BUN UNABLE TO REPORT DUE TO LIPEMIC INTERFERENCE  CREATININE UNABLE TO REPORT DUE TO LIPEMIC INTERFERENCE  CALCIUM 8.6*  AST UNABLE TO PERFORM DUE TO LIPEMIC INTERFERENCE  ALT UNABLE TO PERFORM DUE TO LIPEMIC INTERFERENCE  ALKPHOS 37  BILITOT <0.1*   ------------------------------------------------------------------------------------------------------------------  Cardiac Enzymes No results for input(s): TROPONINI in the last 168 hours. ------------------------------------------------------------------------------------------------------------------  RADIOLOGY:  No results found.  EKG:   Orders placed or performed during the hospital encounter of 01/17/16  . EKG 12-Lead  . EKG 12-Lead    ASSESSMENT AND PLAN:   40 year old male with past medical history significant for type 1 diabetes mellitus, hypertension, hypertriglyceridemia causing acute pancreatitis episodes presents to the hospital secondary to abdominal pain nausea and  vomiting.  #1 acute pancreatitis-related to hypertriglyceridemia again. -Keep nothing by mouth, supportive medications for nausea and abdominal pain. -IV  fluids and get triglycerides controlled.  #2 hypertriglyceridemia-unsure if has family history as patient is adopted. -Triglycerides greater than 5000. Started on insulin drip. Recheck triglycerides and cut the drip to half if the triglycerides are less than 1500. -Also D5 drip started while on insulin drip -Restart gemfibrozil and Crestor after his nausea and vomiting are better  #3 hyponatremia-likely pseudohyponatremia secondary to hypertriglyceridemia. -Monitor sodium levels. On normal saline.  #4 hypokalemia-replaced aggressively especially as patient on insulin drip.  #5 obstructive sleep apnea-continue CPAP  #6 depression-continue home medications after his nausea and vomiting are better.  #7 DVT prophylaxis-on Lovenox   All the records are reviewed and case discussed with Care Management/Social Workerr. Management plans discussed with the patient, family and they are in agreement.  CODE STATUS: Full code  TOTAL TIME TAKING CARE OF THIS PATIENT: 38 minutes.   POSSIBLE D/C IN 2-3 DAYS, DEPENDING ON CLINICAL CONDITION.   Gladstone Lighter M.D on 01/17/2016 at 12:07 PM  Between 7am to 6pm - Pager - [phone]  After 6pm go to [email or web address] - password [email or web address] [facility]  Office  [phone]  CC: Primary care physician; Crecencio Mc, MD

## 2016-01-17 NOTE — ED Notes (Signed)
Pt's o2sat 87% on RA.  Pt placed on 2L o2 via [place].  Dr. Dahlia Client informed.

## 2016-01-17 NOTE — ED Provider Notes (Signed)
[facility] Emergency Department Provider Note  ____________________________________________  Time seen: Approximately 0035 AM  I have reviewed the triage vital signs and the nursing notes.   HISTORY  Chief Complaint Abdominal Pain    HPI Sergio [name] is a 40 y.o. male who comes into the hospital today with the pancreatitis flare. The patient has a history of elevated triglycerides and pancreatitis who comes in with some epigastric pain that started this afternoon. The patient reports that he did not take anything for the pain. He is having some nausea with no vomiting. He reports that the pain is in his epigastric area and goes through to his back. He's had no fevers no chest pain or shortness of breath no dizziness and lightheadedness. History he decided to come in and get checked out. He reports that he knows how bad this can get anyone to take care of it soon. The patient rates his pain a 10 out of 10 in intensity.Pain is sharp pain.   Past Medical History  Diagnosis Date  . Recurrent acute pancreatitis     secondary to hypertriglyceridemia   . Gastric ulcer 2009  . Sleep apnea 1999    uses CPAP  . Chicken pox   . Diabetes mellitus   . HTN (hypertension)   . Familial hypertriglyceridemia     severe  . Morbid obesity [facility])     s/p bariatric sleeve surgery 01/2015    Patient Active Problem List   Diagnosis Date Noted  . Sinusitis, acute maxillary 12/13/2015  . S/P bariatric surgery 02/01/2015  . S/P laparoscopic cholecystectomy 02/01/2015  . Diarrhea 05/29/2012  . Recurrent pancreatitis ([place]) 05/29/2012  . Generalized anxiety disorder 04/29/2012  . Obesity (BMI 30-39.9) 04/29/2012  . DM (diabetes mellitus), type 2, uncontrolled w/neurologic complication ([place])   . Hypertriglyceridemia   . HTN (hypertension)   . Sleep apnea     Past Surgical History  Procedure Laterality Date  . Tonsillectomy  2003  . Vasectomy    . Lumbar disc surgery   2008  . Cholecystectomy    . Laparoscopic gastric sleeve resection      Current Outpatient Rx  Name  Route  Sig  Dispense  Refill  . ALPRAZolam (XANAX) 0.5 MG tablet   Oral   Take 1 tablet (0.5 mg total) by mouth 2 (two) times daily as needed for anxiety.   60 tablet   0   . gemfibrozil (LOPID) 600 MG tablet   Oral   Take 1 tablet (600 mg total) by mouth 2 (two) times daily before a meal.   60 tablet   0   . omega-3 acid ethyl esters (LOVAZA) 1 G capsule   Oral   Take 2 capsules (2 g total) by mouth 2 (two) times daily.   120 capsule   0   . pregabalin (LYRICA) 300 MG capsule   Oral   Take 300 mg by mouth daily.         . rosuvastatin (CRESTOR) 10 MG tablet   Oral   Take 1 tablet (10 mg total) by mouth daily at 6 PM.   30 tablet   0   . sertraline (ZOLOFT) 50 MG tablet   Oral   Take 50 mg by mouth daily.         . traZODone (DESYREL) 100 MG tablet   Oral   Take 100 mg by mouth at bedtime.         [name] amoxicillin-clavulanate (AUGMENTIN) 875-125  MG tablet   Oral   Take 1 tablet by mouth 2 (two) times daily.   14 tablet   0   . aspirin EC 81 MG tablet   Oral   Take 81 mg by mouth daily.           . benzonatate (TESSALON) 200 MG capsule   Oral   Take 1 capsule (200 mg total) by mouth 3 (three) times daily as needed for cough.   60 capsule   1   . insulin aspart (NOVOLOG) 100 UNIT/ML injection   Subcutaneous   Inject 10 Units into the skin 3 (three) times daily with meals.   10 mL   11   . insulin glargine (LANTUS) 100 UNIT/ML injection   Subcutaneous   Inject 0.5 mLs (50 Units total) into the skin daily.   10 mL   11     Allergies Codeine and Ivp dye  Family History  Problem Relation Age of Onset  . Adopted: Yes  . Hypertension Mother   . Hyperlipidemia Mother   . Hypertension Father   . Hyperlipidemia Father     Social History Social History  Substance Use Topics  . Smoking status: Never Smoker   . Smokeless tobacco: Never  Used  . Alcohol Use: No     Comment: once month    Review of Systems Constitutional: No fever/chills Eyes: No visual changes. ENT: No sore throat. Cardiovascular: Denies chest pain. Respiratory: Denies shortness of breath. Gastrointestinal:  abdominal pain and nausea, no vomiting.  No diarrhea.  No constipation. Genitourinary: Negative for dysuria. Musculoskeletal: Negative for back pain. Skin: Negative for rash. Neurological: Negative for headaches, focal weakness or numbness.  10-point ROS otherwise negative.  ____________________________________________   PHYSICAL EXAM:  VITAL SIGNS: ED Triage Vitals  Enc Vitals Group     BP 01/16/16 2306 152/107 mmHg     Pulse Rate 01/16/16 2306 89     Resp 01/16/16 2306 18     Temp 01/16/16 2306 98.2 F (36.8 C)     Temp Source 01/16/16 2306 Oral     SpO2 01/16/16 2306 97 %     Weight 01/16/16 2306 250 lb (113.399 kg)     Height 01/16/16 2306 5\' 10"  (1.778 m)     Head Cir --      Peak Flow --      Pain Score 01/16/16 2306 10     Pain Loc --      Pain Edu? --      Excl. in [place]? --     Constitutional: Alert and oriented. Well appearing and in moderate distress. Eyes: Conjunctivae are normal. PERRL. EOMI. Head: Atraumatic. Nose: No congestion/rhinnorhea. Mouth/Throat: Mucous membranes are moist.  Oropharynx non-erythematous. Cardiovascular: Normal rate, regular rhythm. Grossly normal heart sounds.  Good peripheral circulation. Respiratory: Normal respiratory effort.  No retractions. Lungs CTAB. Gastrointestinal: Soft epigastric tenderness to palpation. No distention. Positive bowel sounds Musculoskeletal: No lower extremity tenderness nor edema.  Neurologic:  Normal speech and language.  Skin:  Skin is warm, dry and intact. Psychiatric: Mood and affect are normal.   ____________________________________________   LABS (all labs ordered are listed, but only abnormal results are displayed)  Labs Reviewed  COMPREHENSIVE  METABOLIC PANEL - Abnormal; Notable for the following:    Sodium 116 (*)    Potassium 3.0 (*)    Chloride 83 (*)    Glucose, Bld 201 (*)    Calcium 8.6 (*)    Total Protein 4.5 (*)  Albumin 5.3 (*)    Total Bilirubin <0.1 (*)    All other components within normal limits  CBC - Abnormal; Notable for the following:    Hemoglobin 12.5 (*)    HCT 39.3 (*)    MCV 78.0 (*)    MCH 24.8 (*)    MCHC 31.8 (*)    RDW 15.9 (*)    All other components within normal limits  GLUCOSE, CAPILLARY - Abnormal; Notable for the following:    Glucose-Capillary 207 (*)    All other components within normal limits  LIPASE, BLOOD  URINALYSIS COMPLETEWITH MICROSCOPIC ([facility] ONLY)   ____________________________________________  EKG  ED ECG REPORT I, Loney Hering, the attending physician, personally viewed and interpreted this ECG.   Date: 01/16/2016  EKG Time: 2309  Rate: 85  Rhythm: normal sinus rhythm  Axis: normal  Intervals:none  ST&T Change: none  ____________________________________________  RADIOLOGY  None ____________________________________________   PROCEDURES  Procedure(s) performed: None  Critical Care performed: No  ____________________________________________   INITIAL IMPRESSION / ASSESSMENT AND PLAN / ED COURSE  Pertinent labs & imaging results that were available during my care of the patient were reviewed by me and considered in my medical decision making (see chart for details).  This is a 40 year old male who comes into the hospital today with some epigastric pain and nausea. The patient has a history of pancreatitis and reports that this feels just like his pancreatitis flares. The patient had some blood work done and it appears as though he has some hyponatremia that is severe with a sodium of 116. Patient reports his sodium goes low whenever his pancreatitis flares up. I will give the patient a liter of normal saline as well as 2 mg of Dilaudid. He  will be admitted to the hospital for further evaluation. ____________________________________________   FINAL CLINICAL IMPRESSION(S) / ED DIAGNOSES  Final diagnoses:  Hyponatremia  Other chronic pancreatitis (HCC)      Loney Hering, MD 01/17/16 [phone]

## 2016-01-17 NOTE — Progress Notes (Signed)
Patient arrived to 1C Room 115. Patient denies pain and all questions answered. Patient oriented to unit. Skin assessment completed with Jarrett Soho RN and skin intact. A&Ox4, VSS, and NSR on tele box #[id number]. Nursing staff will continue to monitor. Earleen Reaper, RN

## 2016-01-17 NOTE — ED Notes (Signed)
PT states unable to give urine at this time

## 2016-01-17 NOTE — Consult Note (Signed)
Reason for Consult: Urinary retention with hypocontractile bladder, History of urethral stricture  Referring Physician: Hillary Bow MD  Sergio [name] is an 40 y.o. male.   HPI:   1 - Urinary retention with hypocontractile bladder - pt with infrequent  voids noted durign admission 01/2016 for recurrent pancreatitis. H/o L spine disease / surgery with chronic lower extremity neuropathy. Denies any significant chance in voiding behavior by primary team quite concerned abotu frank retention. Bedside cysto 01/17/16 reveals wide open urethra and bladder neck, 800cc urine evacuated with foley placement suggesting hypocontractility as principal cause of retention, not recurrent stricture. Current Cr <0.5.   2 - History of urethral stricture - s/p bedside dilation procedure at [facility] in [facility] 2016 for what sound like urethral false pass peri-operatively at time of gastric sleeve. Cysto 01/2016 without stricture whatsoever.  PMH sig for recurrent pancreatitis, diabetes, depression, L spin surgery with post-op neuropathy, gastric sleeve, OSA/CPAP.  Today "Sergio [name]" is seen as urgent consult for urinary retention and rule out recurrent stricture. He normally follows with Dr. Jacqlyn Larsen with Urology in [place].   Past Medical History  Diagnosis Date  . Recurrent acute pancreatitis     secondary to hypertriglyceridemia   . Gastric ulcer 2009  . Sleep apnea 1999    uses CPAP  . Chicken pox   . Diabetes mellitus   . HTN (hypertension)   . Familial hypertriglyceridemia     severe  . Morbid obesity ([place])     s/p bariatric sleeve surgery 01/2015  . Anxiety   . Depression     Past Surgical History  Procedure Laterality Date  . Tonsillectomy  2003  . Vasectomy    . Lumbar disc surgery  2008  . Cholecystectomy    . Laparoscopic gastric sleeve resection      Family History  Problem Relation Age of Onset  . Adopted: Yes  . Hypertension Mother   . Hyperlipidemia Mother   . Hypertension Father    . Hyperlipidemia Father     Social History:  reports that he has never smoked. He has never used smokeless tobacco. He reports that he does not drink alcohol or use illicit drugs.  Allergies:  Allergies  Allergen Reactions  . Codeine Anaphylaxis  . Ivp Dye [Iodinated Diagnostic Agents] Other (See Comments)    Kidneys stop working      Medications: I have reviewed the patient's current medications.   Review of Systems  Constitutional: Positive for malaise/fatigue.  HENT: Negative.   Eyes: Negative.   Respiratory: Negative.   Cardiovascular: Negative.   Gastrointestinal: Positive for abdominal pain.  Genitourinary: Positive for frequency. Negative for dysuria, urgency and flank pain.       Straining to void  Musculoskeletal: Negative.   Skin: Negative.   Neurological:       LE neuropathy  Endo/Heme/Allergies: Negative.   Psychiatric/Behavioral: Negative.    Blood pressure 128/71, pulse 88, temperature 97.8 F (36.6 C), temperature source Axillary, resp. rate 17, height 5\' 10"  (1.778 m), weight 112.991 kg (249 lb 1.6 oz), SpO2 96 %. Physical Exam  Constitutional: He appears well-developed.  HENT:  Head: Normocephalic.  allopecia noted  Eyes: Pupils are equal, round, and reactive to light.  Neck: Normal range of motion.  Cardiovascular: Normal rate.   Respiratory:  On CPAP, non-labored breathing  GI: Soft.  Genitourinary: Penis normal.  Circ'd, phallus straigfht, testes descnded w/o palpable mass  Musculoskeletal: Normal range of motion.  Neurological: He is alert.  Skin: Skin  is warm.  Psychiatric: He has a normal mood and affect. His behavior is normal. Judgment and thought content normal.   [place] PVR >658mL  CYSTOSCOPY: Penis prepped with iodine. Flex cystoscopy perforemd with 62F flexible cystoscopy. Widely patent pendulous, membranous, prostatic urethera and bladder neck. No bladder masses. Bladder very capacious but non-trabeculated. No evidence of mass or  false pass. Sensor wire placed over which 74F council catheter placed with 10cc sterile water in balloon. Immediate efflux 800cc clear yellow urine.   Penis prepped with   Assessment/Plan:  1 - Urinary retention with hypocontractile bladder - this appears to be chronic problem likely related to neuropathy from prior back surgery and diabetes. No anatomic blockage with cysto. Discussed with pt he would benefit from urodynamics in elective setting to help determine true bladder sensation and contractility. As his Cr is normal and he denies any sort of acute change from recent baseline voiding, I feel acceptable to remove current foley prior to discharge when not needed for accurate UOP monitoring and pt can f/u with his Urologsit Dr. Jacqlyn Larsen or we are happy to see with [place]. Discussed with pt frankly that if left untreated this could lead to renal demise in long term.   2 - History of urethral stricture - no recurrence by bedside cysto.  3 - We will follow prn going forward. Please call me directly with any questions.   PMH sig for recurrent pancreatitis, diabtetes, depression, L spin surgery with post-op neuropathy, gastric sleeve, OSA/CPAP.  Today "Sergio [name]" is seen as urgent consult for urinary retention and rule out recurrent stricture. He normally follows with Dr. Jacqlyn Larsen with Urology in [place].   Sergio [name] 01/17/2016, 10:18 PM

## 2016-01-17 NOTE — Progress Notes (Addendum)
MEDICATION RELATED CONSULT NOTE - INITIAL   Pharmacy Consult for Insulin drip for hypertriglyceridemia  Allergies  Allergen Reactions  . Codeine Anaphylaxis  . Ivp Dye [Iodinated Diagnostic Agents] Other (See Comments)    Kidneys stop working   Patient Measurements: Height: 5\' 10"  (177.8 cm) Weight: 249 lb 1.6 oz (112.991 kg) IBW/kg (Calculated) : 73  Vital Signs: Temp: 97.8 F (36.6 C) (04/08 1600) Temp Source: Axillary (04/08 1600) BP: 128/71 mmHg (04/08 1800) Pulse Rate: 88 (04/08 1800)  Lipid Panel     Component Value Date/Time   CHOL 969* 01/16/2016 2310   CHOL 357* 09/04/2014 0328   CHOL 1116* 04/27/2012 0939   TRIG >5000* 01/17/2016 1606   TRIG 1201* 11/04/2014 0427   HDL 21* 01/16/2016 2310   HDL SEE COMMENT 09/04/2014 0328   HDL 60 04/27/2012 0939   CHOLHDL NOT REPORTED DUE TO HIGH TRIGLYCERIDES 01/16/2016 2310   CHOLHDL 18.6* 04/27/2012 0939   VLDL UNABLE TO CALCULATE IF TRIGLYCERIDE OVER 400 mg/dL 01/16/2016 2310   VLDL SEE COMMENT 09/04/2014 0328   LDLCALC UNABLE TO CALCULATE IF TRIGLYCERIDE OVER 400 mg/dL 01/16/2016 2310   LDLCALC SEE COMMENT 09/04/2014 0328   LDLCALC Comment 04/27/2012 0939   LDLDIRECT 111.4 06/30/2012 0950   Medical History: Past Medical History  Diagnosis Date  . Recurrent acute pancreatitis     secondary to hypertriglyceridemia   . Gastric ulcer 2009  . Sleep apnea 1999    uses CPAP  . Chicken pox   . Diabetes mellitus   . HTN (hypertension)   . Familial hypertriglyceridemia     severe  . Morbid obesity ([place])     s/p bariatric sleeve surgery 01/2015  . Anxiety   . Depression    Assessment: 40 yo male with chronic pancreatitis secondary to familial hypertriglyceridemia.  Pharmacy was consulted for initiation of insulin drip for  Hypertriglyceridemia.  Goal of Therapy:  Discontinue drip when Triglyceridemia < [phone]  Plan:  After discussion with Dr. Tressia Miners insulin infusion will be  started at 12 units/hour along  with D5W infusion at 15ml/hour. CBGs every hour and orders for when to notify MD have been entered.  BMP scheduled to 1400 today and tomorrow AM at 0500. Triglycerides ordered every 8 hours.   4/8 K @ 2100 remains low at 3.1 despite replacement. Patient is not on fluids with K. Will order another 40 mEq IV. Recheck K with AM labs tomorrow.   4/9 00:00 TG >5000 still. Continue insulin drip at current rate.  Theodis Shove, PharmD  01/17/2016,10:00 PM

## 2016-01-17 NOTE — Progress Notes (Signed)
RN notified Dr. Tressia Miners that patient is unable to void and has tried several times and has not voided since coming to the hospital.  Patient is refusing a catheter including in and out due to past urology issues and is requesting flomax to try. MD stated she would order flomax.

## 2016-01-18 LAB — GLUCOSE, CAPILLARY
Glucose-Capillary: 103 mg/dL — ABNORMAL HIGH (ref 65–99)
Glucose-Capillary: 121 mg/dL — ABNORMAL HIGH (ref 65–99)
Glucose-Capillary: 60 mg/dL — ABNORMAL LOW (ref 65–99)
Glucose-Capillary: 65 mg/dL (ref 65–99)
Glucose-Capillary: 67 mg/dL (ref 65–99)
Glucose-Capillary: 71 mg/dL (ref 65–99)
Glucose-Capillary: 73 mg/dL (ref 65–99)
Glucose-Capillary: 75 mg/dL (ref 65–99)
Glucose-Capillary: 77 mg/dL (ref 65–99)
Glucose-Capillary: 82 mg/dL (ref 65–99)
Glucose-Capillary: 82 mg/dL (ref 65–99)
Glucose-Capillary: 86 mg/dL (ref 65–99)
Glucose-Capillary: 87 mg/dL (ref 65–99)
Glucose-Capillary: 87 mg/dL (ref 65–99)
Glucose-Capillary: 87 mg/dL (ref 65–99)
Glucose-Capillary: 88 mg/dL (ref 65–99)
Glucose-Capillary: 89 mg/dL (ref 65–99)
Glucose-Capillary: 90 mg/dL (ref 65–99)
Glucose-Capillary: 92 mg/dL (ref 65–99)
Glucose-Capillary: 92 mg/dL (ref 65–99)
Glucose-Capillary: 94 mg/dL (ref 65–99)

## 2016-01-18 LAB — BASIC METABOLIC PANEL
ANION GAP: 7 (ref 5–15)
BUN: 6 mg/dL (ref 6–20)
CALCIUM: 7.9 mg/dL — AB (ref 8.9–10.3)
CO2: 26 mmol/L (ref 22–32)
CREATININE: 0.56 mg/dL — AB (ref 0.61–1.24)
Chloride: 91 mmol/L — ABNORMAL LOW (ref 101–111)
GLUCOSE: 87 mg/dL (ref 65–99)
Potassium: 3.3 mmol/L — ABNORMAL LOW (ref 3.5–5.1)
Sodium: 124 mmol/L — ABNORMAL LOW (ref 135–145)

## 2016-01-18 LAB — POTASSIUM: POTASSIUM: 3.7 mmol/L (ref 3.5–5.1)

## 2016-01-18 LAB — TRIGLYCERIDES: Triglycerides: >5000 mg/dL — ABNORMAL HIGH (ref <150)

## 2016-01-18 MED ORDER — DEXTROSE 50 % IV SOLN
25.0000 mL | Freq: Once | INTRAVENOUS | Status: AC
  Administered 2016-01-18: 25 mL via INTRAVENOUS
  Filled 2016-01-18: qty 50

## 2016-01-18 MED ORDER — POTASSIUM CHLORIDE 10 MEQ/100ML IV SOLN
10.0000 meq | INTRAVENOUS | Status: AC
  Administered 2016-01-18 (×2): 10 meq via INTRAVENOUS
  Filled 2016-01-18 (×2): qty 100

## 2016-01-18 NOTE — Progress Notes (Signed)
MEDICATION RELATED CONSULT NOTE -Follow up  Pharmacy Consult for Insulin drip for hypertriglyceridemia   Allergies  Allergen Reactions  . Codeine Anaphylaxis  . Ivp Dye [Iodinated Diagnostic Agents] Other (See Comments)    Kidneys stop working    Patient Measurements: Height: 5\' 10"  (177.8 cm) Weight: 256 lb 2.8 oz (116.2 kg) IBW/kg (Calculated) : 73   Vital Signs: Temp: 98.6 F (37 C) (04/09 2000) Temp Source: Oral (04/09 2000) BP: 109/69 mmHg (04/09 2000) Pulse Rate: 94 (04/09 2000)  Lipid Panel     Component Value Date/Time   CHOL 969* 01/16/2016 2310   CHOL 357* 09/04/2014 0328   CHOL 1116* 04/27/2012 0939   TRIG >5000* 01/18/2016 1236   TRIG 1201* 11/04/2014 0427   HDL 21* 01/16/2016 2310   HDL SEE COMMENT 09/04/2014 0328   HDL 60 04/27/2012 0939   CHOLHDL NOT REPORTED DUE TO HIGH TRIGLYCERIDES 01/16/2016 2310   CHOLHDL 18.6* 04/27/2012 0939   VLDL UNABLE TO CALCULATE IF TRIGLYCERIDE OVER 400 mg/dL 01/16/2016 2310   VLDL SEE COMMENT 09/04/2014 0328   LDLCALC UNABLE TO CALCULATE IF TRIGLYCERIDE OVER 400 mg/dL 01/16/2016 2310   LDLCALC SEE COMMENT 09/04/2014 0328   LDLCALC Comment 04/27/2012 0939   LDLDIRECT 111.4 06/30/2012 0950   Medical History: Past Medical History  Diagnosis Date  . Recurrent acute pancreatitis     secondary to hypertriglyceridemia   . Gastric ulcer 2009  . Sleep apnea 1999    uses CPAP  . Chicken pox   . Diabetes mellitus   . HTN (hypertension)   . Familial hypertriglyceridemia     severe  . Morbid obesity ([place])     s/p bariatric sleeve surgery 01/2015  . Anxiety   . Depression    4/9 0638 Triglycerides still > 5000  Assessment: 40 yo male with chronic pancreatitis secondary to familial hypertriglyceridemia.  Pharmacy was consulted for initiation of insulin drip for  Hypertriglyceridemia.  Goal of Therapy:  Discontinue drip when Triglyceridemia < [phone]  Plan:  After discussion with Dr. Tressia Miners insulin infusion will  be  started at 12 units/hour (non-titrating) along with D5W infusion at 60ml/hour. CBGs every hour and orders for when to notify MD have been entered.   4/9:  K= 3.7   0638 Triglycerides still > 5000      BG 103, 86, 87.      Patient now on IV fluids- D 10%/1/2 NS w/ KCL 40 meq/      Liter at 100 ml/hr. No additional supplementation needed at this time.      1230 TG still > 5000  BMP scheduled tomorrow AM at 0500.  Triglycerides ordered every 8 hours.   Theodis Shove PharmD Clinical Pharmacist  01/18/2016,8:48 PM

## 2016-01-18 NOTE — Progress Notes (Signed)
Pt alert and oriented; on and off cpap O2 sats mid 90's; c/o pain administered prn pain medication with relief of symptoms; nausea improved; adequate uop via foley; blood sugars decreased to mid 60's Dr. Tressia Miners aware following interventions blood sugars improved; pt remains NPO except medication; NSR on cardiac monitor; will continue to monitor and assess pt

## 2016-01-18 NOTE — Progress Notes (Signed)
MEDICATION RELATED CONSULT NOTE -Follow up  Pharmacy Consult for Insulin drip for hypertriglyceridemia   Allergies  Allergen Reactions  . Codeine Anaphylaxis  . Ivp Dye [Iodinated Diagnostic Agents] Other (See Comments)    Kidneys stop working    Patient Measurements: Height: 5\' 10"  (177.8 cm) Weight: 256 lb 2.8 oz (116.2 kg) IBW/kg (Calculated) : 73   Vital Signs: Temp: 100.5 F (38.1 C) (04/09 0718) Temp Source: Oral (04/09 0718) BP: 119/70 mmHg (04/09 1000) Pulse Rate: 96 (04/09 1000)  Lipid Panel     Component Value Date/Time   CHOL 969* 01/16/2016 2310   CHOL 357* 09/04/2014 0328   CHOL 1116* 04/27/2012 0939   TRIG >5000* 01/18/2016 0638   TRIG 1201* 11/04/2014 0427   HDL 21* 01/16/2016 2310   HDL SEE COMMENT 09/04/2014 0328   HDL 60 04/27/2012 0939   CHOLHDL NOT REPORTED DUE TO HIGH TRIGLYCERIDES 01/16/2016 2310   CHOLHDL 18.6* 04/27/2012 0939   VLDL UNABLE TO CALCULATE IF TRIGLYCERIDE OVER 400 mg/dL 01/16/2016 2310   VLDL SEE COMMENT 09/04/2014 0328   LDLCALC UNABLE TO CALCULATE IF TRIGLYCERIDE OVER 400 mg/dL 01/16/2016 2310   LDLCALC SEE COMMENT 09/04/2014 0328   LDLCALC Comment 04/27/2012 0939   LDLDIRECT 111.4 06/30/2012 0950   Medical History: Past Medical History  Diagnosis Date  . Recurrent acute pancreatitis     secondary to hypertriglyceridemia   . Gastric ulcer 2009  . Sleep apnea 1999    uses CPAP  . Chicken pox   . Diabetes mellitus   . HTN (hypertension)   . Familial hypertriglyceridemia     severe  . Morbid obesity ([place])     s/p bariatric sleeve surgery 01/2015  . Anxiety   . Depression    4/9 0638 Triglycerides still > 5000  Assessment: 40 yo [name] with chronic pancreatitis secondary to familial hypertriglyceridemia.  Pharmacy was consulted for initiation of insulin drip for  Hypertriglyceridemia.  Goal of Therapy:  Discontinue drip when Triglyceridemia < [phone]  Plan:  After discussion with Dr. Tressia Miners insulin infusion  will be  started at 12 units/hour (non-titrating) along with D5W infusion at 20ml/hour. CBGs every hour and orders for when to notify MD have been entered.   4/9:  K= 3.3   Patient on IV fluids- D 10%/1/2 NS w/ KCL 40 meq/ Liter at 100 ml/hr. Will give KCL 19meq IV x 1. Recheck K+ at 1800. BG 103, 86, 87. 4/9 0638 Triglycerides still > 5000  BMP scheduled tomorrow AM at 0500. Triglycerides ordered every 8 hours.   Chinita Greenland PharmD Clinical Pharmacist  01/18/2016,10:48 AM

## 2016-01-18 NOTE — Progress Notes (Signed)
Called into patient's room to examine IV that was causing him discomfort.  IV site was swollen and the IV catheter was visible on the skin.  Fluids were temporarily paused, IV was removed, arm was elevated, and ice was applied.  Another IV was inserted by this RN on the other arm, patient tolerated well.  Will continue to monitor.

## 2016-01-18 NOTE — Progress Notes (Signed)
[place] [facility] [place] NAME: Sergio [name]    MR#:  [id number]  DATE OF BIRTH:  [birth year]  SUBJECTIVE:  CHIEF COMPLAINT:   Chief Complaint  Patient presents with  . Abdominal Pain   -Admitted for pancreatitis related to hypertriglyceridemia. Triglycerides greater than 5000. -Still has abdominal pain, nausea and vomiting improved - low grade fever this AM, urinary retention last night- requiring foley  REVIEW OF SYSTEMS:  Review of Systems  Constitutional: Positive for malaise/fatigue. Negative for fever and chills.  HENT: Negative for ear discharge, ear pain and nosebleeds.   Eyes: Negative for blurred vision, double vision and photophobia.  Respiratory: Negative for cough, shortness of breath and wheezing.   Cardiovascular: Negative for chest pain and palpitations.  Gastrointestinal: Positive for nausea, vomiting and abdominal pain. Negative for diarrhea and constipation.  Genitourinary: Negative for dysuria and urgency.       Urinary retention  Musculoskeletal: Negative for myalgias and neck pain.  Neurological: Negative for dizziness, sensory change, speech change, focal weakness, seizures and headaches.  Psychiatric/Behavioral: Negative for depression.    DRUG ALLERGIES:   Allergies  Allergen Reactions  . Codeine Anaphylaxis  . Ivp Dye [Iodinated Diagnostic Agents] Other (See Comments)    Kidneys stop working    VITALS:  Blood pressure 115/84, pulse 102, temperature 100.5 F (38.1 C), temperature source Oral, resp. rate 8, height 5\' 10"  (1.778 m), weight 116.2 kg (256 lb 2.8 oz), SpO2 92 %.  PHYSICAL EXAMINATION:  Physical Exam  GENERAL:  40 y.o.-year-old patient lying in the bed with minimal distress due to abdominal pain.  EYES: Pupils equal, round, reactive to light and accommodation. No scleral icterus. Extraocular muscles intact.  HEENT: Head atraumatic, normocephalic. Oropharynx and nasopharynx clear.  NECK:   Supple, no jugular venous distention. No thyroid enlargement, no tenderness.  LUNGS: Normal breath sounds bilaterally, no wheezing, rales,rhonchi or crepitation. No use of accessory muscles of respiration.  CARDIOVASCULAR: S1, S2 normal. No murmurs, rubs, or gallops.  ABDOMEN: Soft, but tender to palpation in the epigastric and left upper quadrant areas, nondistended. Bowel sounds present. No organomegaly or mass.  EXTREMITIES: No pedal edema, cyanosis, or clubbing.  NEUROLOGIC: Cranial nerves II through XII are intact. Muscle strength 5/5 in all extremities. Sensation intact. Gait not checked.  PSYCHIATRIC: The patient is alert and oriented x 3.  SKIN: No obvious rash, lesion, or ulcer.    LABORATORY PANEL:   CBC  Recent Labs Lab 01/16/16 2310  WBC 7.1  HGB 12.5*  HCT 39.3*  PLT 257   ------------------------------------------------------------------------------------------------------------------  Chemistries   Recent Labs Lab 01/16/16 2310  01/18/16 0638  NA 116*  < > 124*  K 3.0*  < > 3.3*  CL 83*  < > 91*  CO2 23  < > 26  GLUCOSE 201*  < > 87  BUN UNABLE TO REPORT DUE TO LIPEMIC INTERFERENCE  < > 6  CREATININE UNABLE TO REPORT DUE TO LIPEMIC INTERFERENCE  < > 0.56*  CALCIUM 8.6*  < > 7.9*  AST UNABLE TO PERFORM DUE TO LIPEMIC INTERFERENCE  --   --   ALT UNABLE TO PERFORM DUE TO LIPEMIC INTERFERENCE  --   --   ALKPHOS 45  --   --   BILITOT <0.1*  --   --   < > = values in this interval not displayed. ------------------------------------------------------------------------------------------------------------------  Cardiac Enzymes No results for input(s): TROPONINI in the last 168 hours. ------------------------------------------------------------------------------------------------------------------  RADIOLOGY:  No results found.  EKG:   Orders placed or performed during the hospital encounter of 01/17/16  . EKG 12-Lead  . EKG 12-Lead    ASSESSMENT AND  PLAN:   40 year old male with past medical history significant for type 1 diabetes mellitus, hypertension, hypertriglyceridemia causing acute pancreatitis episodes presents to the hospital secondary to abdominal pain nausea and vomiting.  #1 acute pancreatitis-related to hypertriglyceridemia again. -Keep nothing by mouth, supportive medications for nausea and abdominal pain. -IV fluids and get triglycerides controlled.  #2 hypertriglyceridemia-unsure if has family history as patient is adopted. -Triglycerides greater than 5000. on insulin drip. Recheck triglycerides and cut the drip to half if the triglycerides are less than 1500. -Also D10 drip started while on insulin drip as sugars are on the lower side -Restarted gemfibrozil and Crestor after his nausea and vomiting are better  #3 hyponatremia-likely pseudohyponatremia secondary to hypertriglyceridemia. So patient receiving D10 drip -Monitor sodium levels.   #4 hypokalemia-replaced aggressively especially as patient on insulin drip.  #5 obstructive sleep apnea-continue CPAP  #6 depression-continue home medications- on trazodone, Zoloft and Xanax  #7 DVT prophylaxis-on Lovenox   All the records are reviewed and case discussed with Care Management/Social Workerr. Management plans discussed with the patient, family and they are in agreement.  CODE STATUS: Full code  TOTAL TIME TAKING CARE OF THIS PATIENT: 38 minutes.   POSSIBLE D/C IN 2-3 DAYS, DEPENDING ON CLINICAL CONDITION.   Gladstone Lighter M.D on 01/18/2016 at 11:45 AM  Between 7am to 6pm - Pager - [phone]  After 6pm go to [email or web address] - password [email or web address] [facility]  Office  [phone]  CC: Primary care physician; Crecencio Mc, MD

## 2016-01-18 NOTE — Progress Notes (Signed)
Spoke with Dr. Tressia Miners informed md pts blood sugar 67 md gave orders to increase D10W1/2NS with 40 meq K+ to 125 ml/hr

## 2016-01-18 NOTE — Progress Notes (Signed)
MEDICATION RELATED CONSULT NOTE -Follow up  Pharmacy Consult for Insulin drip for hypertriglyceridemia   Allergies  Allergen Reactions  . Codeine Anaphylaxis  . Ivp Dye [Iodinated Diagnostic Agents] Other (See Comments)    Kidneys stop working    Patient Measurements: Height: 5\' 10"  (177.8 cm) Weight: 256 lb 2.8 oz (116.2 kg) IBW/kg (Calculated) : 73   Vital Signs: Temp: 100.6 F (38.1 C) (04/09 1245) Temp Source: Oral (04/09 1245) BP: 108/58 mmHg (04/09 1600) Pulse Rate: 96 (04/09 1648)  Lipid Panel     Component Value Date/Time   CHOL 969* 01/16/2016 2310   CHOL 357* 09/04/2014 0328   CHOL 1116* 04/27/2012 0939   TRIG >5000* 01/18/2016 1236   TRIG 1201* 11/04/2014 0427   HDL 21* 01/16/2016 2310   HDL SEE COMMENT 09/04/2014 0328   HDL 60 04/27/2012 0939   CHOLHDL NOT REPORTED DUE TO HIGH TRIGLYCERIDES 01/16/2016 2310   CHOLHDL 18.6* 04/27/2012 0939   VLDL UNABLE TO CALCULATE IF TRIGLYCERIDE OVER 400 mg/dL 01/16/2016 2310   VLDL SEE COMMENT 09/04/2014 0328   LDLCALC UNABLE TO CALCULATE IF TRIGLYCERIDE OVER 400 mg/dL 01/16/2016 2310   LDLCALC SEE COMMENT 09/04/2014 0328   LDLCALC Comment 04/27/2012 0939   LDLDIRECT 111.4 06/30/2012 0950   Medical History: Past Medical History  Diagnosis Date  . Recurrent acute pancreatitis     secondary to hypertriglyceridemia   . Gastric ulcer 2009  . Sleep apnea 1999    uses CPAP  . Chicken pox   . Diabetes mellitus   . HTN (hypertension)   . Familial hypertriglyceridemia     severe  . Morbid obesity ([place])     s/p bariatric sleeve surgery 01/2015  . Anxiety   . Depression    4/9 0638 Triglycerides still > 5000  Assessment: 40 yo male with chronic pancreatitis secondary to familial hypertriglyceridemia.  Pharmacy was consulted for initiation of insulin drip for  Hypertriglyceridemia.  Goal of Therapy:  Discontinue drip when Triglyceridemia < [phone]  Plan:  After discussion with Dr. Tressia Miners insulin infusion  will be  started at 12 units/hour (non-titrating) along with D5W infusion at 72ml/hour. CBGs every hour and orders for when to notify MD have been entered.   4/9:  K= 3.3   [id number] Triglycerides still > 5000      BG 103, 86, 87.      Patient now on IV fluids- D 10%/1/2 NS w/ KCL 40 meq/      Liter at 100 ml/hr. Will give KCL 50meq IV x 1.       Recheck K+ at 2000.      1230 TG still > 5000  BMP scheduled tomorrow AM at 0500.  Triglycerides ordered every 8 hours.   Chinita Greenland PharmD Clinical Pharmacist  01/18/2016,4:59 PM

## 2016-01-19 DIAGNOSIS — N319 Neuromuscular dysfunction of bladder, unspecified: Secondary | ICD-10-CM

## 2016-01-19 DIAGNOSIS — R338 Other retention of urine: Secondary | ICD-10-CM

## 2016-01-19 LAB — BASIC METABOLIC PANEL
ANION GAP: 6 (ref 5–15)
ANION GAP: 14 (ref 5–15)
BUN: <5 mg/dL — ABNORMAL LOW (ref 6–20)
CALCIUM: 9 mg/dL (ref 8.9–10.3)
CHLORIDE: 94 mmol/L — AB (ref 101–111)
CO2: 28 mmol/L (ref 22–32)
CO2: 22 mmol/L (ref 22–32)
Calcium: 8.5 mg/dL — ABNORMAL LOW (ref 8.9–10.3)
Chloride: 97 mmol/L — ABNORMAL LOW (ref 101–111)
Creatinine, Ser: 0.85 mg/dL (ref 0.61–1.24)
Creatinine, Ser: 0.71 mg/dL (ref 0.61–1.24)
GFR calc Af Amer: >60 mL/min (ref >60)
Glucose, Bld: 101 mg/dL — ABNORMAL HIGH (ref 65–99)
Glucose, Bld: 124 mg/dL — ABNORMAL HIGH (ref 65–99)
POTASSIUM: UNDETERMINED mmol/L (ref 3.5–5.1)
Potassium: 4.6 mmol/L (ref 3.5–5.1)
SODIUM: 128 mmol/L — AB (ref 135–145)
SODIUM: 133 mmol/L — AB (ref 135–145)

## 2016-01-19 LAB — GLUCOSE, CAPILLARY
Glucose-Capillary: 100 mg/dL — ABNORMAL HIGH (ref 65–99)
Glucose-Capillary: 101 mg/dL — ABNORMAL HIGH (ref 65–99)
Glucose-Capillary: 106 mg/dL — ABNORMAL HIGH (ref 65–99)
Glucose-Capillary: 106 mg/dL — ABNORMAL HIGH (ref 65–99)
Glucose-Capillary: 107 mg/dL — ABNORMAL HIGH (ref 65–99)
Glucose-Capillary: 109 mg/dL — ABNORMAL HIGH (ref 65–99)
Glucose-Capillary: 110 mg/dL — ABNORMAL HIGH (ref 65–99)
Glucose-Capillary: 111 mg/dL — ABNORMAL HIGH (ref 65–99)
Glucose-Capillary: 111 mg/dL — ABNORMAL HIGH (ref 65–99)
Glucose-Capillary: 111 mg/dL — ABNORMAL HIGH (ref 65–99)
Glucose-Capillary: 111 mg/dL — ABNORMAL HIGH (ref 65–99)
Glucose-Capillary: 116 mg/dL — ABNORMAL HIGH (ref 65–99)
Glucose-Capillary: 117 mg/dL — ABNORMAL HIGH (ref 65–99)
Glucose-Capillary: 118 mg/dL — ABNORMAL HIGH (ref 65–99)
Glucose-Capillary: 126 mg/dL — ABNORMAL HIGH (ref 65–99)
Glucose-Capillary: 143 mg/dL — ABNORMAL HIGH (ref 65–99)

## 2016-01-19 LAB — TRIGLYCERIDES
TRIGLYCERIDES: 4924 mg/dL — AB (ref <150)
TRIGLYCERIDES: 4469 mg/dL — AB (ref <150)
Triglycerides: 3614 mg/dL — ABNORMAL HIGH (ref <150)

## 2016-01-19 LAB — MAGNESIUM: MAGNESIUM: 2.1 mg/dL (ref 1.7–2.4)

## 2016-01-19 LAB — POTASSIUM
POTASSIUM: UNDETERMINED mmol/L (ref 3.5–5.1)
POTASSIUM: 5.3 mmol/L — AB (ref 3.5–5.1)

## 2016-01-19 LAB — PHOSPHORUS: Phosphorus: 4 mg/dL (ref 2.5–4.6)

## 2016-01-19 MED ORDER — ENOXAPARIN SODIUM 40 MG/0.4ML ~~LOC~~ SOLN
40.0000 mg | SUBCUTANEOUS | Status: DC
  Administered 2016-01-19 – 2016-01-26 (×8): 40 mg via SUBCUTANEOUS
  Filled 2016-01-19 (×8): qty 0.4

## 2016-01-19 MED ORDER — SODIUM CHLORIDE 4 MEQ/ML IV SOLN
INTRAVENOUS | Status: DC
  Filled 2016-01-19 (×3): qty 1000

## 2016-01-19 MED ORDER — SODIUM CHLORIDE 4 MEQ/ML IV SOLN
INTRAVENOUS | Status: DC
  Administered 2016-01-19 – 2016-01-24 (×14): via INTRAVENOUS
  Filled 2016-01-19 (×28): qty 1000

## 2016-01-19 MED ORDER — DEXTROSE 50 % IV SOLN
50.0000 mL | INTRAVENOUS | Status: DC | PRN
  Administered 2016-01-24: 50 mL via INTRAVENOUS
  Filled 2016-01-19: qty 50

## 2016-01-19 MED ORDER — DEXTROSE 50 % IV SOLN: 25.0000 mL | INTRAVENOUS | Status: DC | PRN

## 2016-01-19 MED ORDER — DEXTROSE 10 % IV SOLN: INTRAVENOUS | Status: DC

## 2016-01-19 MED ORDER — SODIUM CHLORIDE 4 MEQ/ML IV SOLN
INTRAVENOUS | Status: DC
  Filled 2016-01-19 (×3): qty 980.7

## 2016-01-19 MED ORDER — ROSUVASTATIN CALCIUM 10 MG PO TABS
20.0000 mg | ORAL_TABLET | Freq: Every day | ORAL | Status: DC
  Administered 2016-01-19 – 2016-01-26 (×8): 20 mg via ORAL
  Filled 2016-01-19 (×2): qty 2
  Filled 2016-01-19 (×2): qty 1
  Filled 2016-01-19: qty 2
  Filled 2016-01-19 (×3): qty 1

## 2016-01-19 NOTE — Consult Note (Signed)
ENDOCRINOLOGY CONSULTATION  REFERRING PHYSICIAN:  Paula Compton, MD. CONSULTING PHYSICIAN:  A. Lavone Orn, MD. PRIMARY CARE PROVIDER:  [name], MD.  CHIEF COMPLAINT:  Diabetes mellitus  HISTORY OF PRESENT ILLNESS:  40 y.o. male with type 2 diabetes and familial hypertriglyceridemia with recurrent acute pancreatitis due to hypertriglyceridemia was admitted on 01/17/16 with acute abd pain similiar to prior episodes of acute pancreatitis. He was found to have triglyceride level >5000 and glucose of 201 with a normal lipase level of 45. He was started on IVF and IV insulin. He has been NPO. Now on IV Dextrose to maintain insulin infusion. On admission, abd pain was 10/10 and now his abd pain is 7/10. He reports no appetite. Mild nausea. No emesis.   Patient is well known to me. Diabetes is managed with an insulin pump. However he reports he ran out of pump supplies one week prior to admission. Not clear if he ordered more. He did not contact my office. Over the last week, he has been taking Lantus 25 units SQ once daily and not taking any NovoLog. Hypertriglyceridemia typically managed with fibrate + fish oil. He reports no dietary indiscretions. Denies alcohol use.    PAST MEDICAL HISTORY:  Past Medical History  Diagnosis Date  . Recurrent acute pancreatitis     secondary to hypertriglyceridemia   . Gastric ulcer 2009  . Sleep apnea 1999    uses CPAP  . Chicken pox   . Diabetes mellitus   . HTN (hypertension)   . Familial hypertriglyceridemia     severe  . Morbid obesity ([place])     s/p bariatric sleeve surgery 01/2015  . Anxiety   . Depression      CURRENT MEDICATIONS:  . docusate sodium  100 mg Oral BID  . enoxaparin (LOVENOX) injection  40 mg Subcutaneous Q24H  . gemfibrozil  600 mg Oral BID AC  . omega-3 acid ethyl esters  4 g Oral Daily  . pregabalin  300 mg Oral Daily  . rosuvastatin  10 mg Oral q1800  . sertraline  50 mg Oral Daily  . sodium chloride flush  3 mL  Intravenous Q12H  . tamsulosin  0.4 mg Oral Daily  . traZODone  100 mg Oral QHS  IV insulin (NS 0.9% with insulin 250 units) at 12 ml/hr   SOCIAL HISTORY:  Social History  Substance Use Topics  . Smoking status: Never Smoker   . Smokeless tobacco: Never Used  . Alcohol Use: No     Comment: once month     FAMILY HISTORY:   Family History  Problem Relation Age of Onset  . Adopted: Yes  . Hypertension Mother   . Hyperlipidemia Mother   . Hypertension Father   . Hyperlipidemia Father      ALLERGIES:  Allergies  Allergen Reactions  . Codeine Anaphylaxis  . Ivp Dye [Iodinated Diagnostic Agents] Other (See Comments)    Kidneys stop working    REVIEW OF SYSTEMS:  GENERAL:  No weight loss.  No fever.  HEENT:  No blurred vision. No sore throat.  NECK:  No neck pain or dysphagia.  CARDIAC:  No chest pain or palpitation.  PULMONARY:  No cough or shortness of breath.  ABDOMEN:  No constipation. EXTREMITIES:  No lower extremity swelling.  ENDOCRINE:  No heat or cold intolerance.  GU: +urinary retention. Required Foley catheter on admission. GENITOURINARY:  No dysuria or hematuria. SKIN:  No recent rash or skin changes.   PHYSICAL EXAMINATION:  BP 120/71 mmHg  Pulse 87  Temp(Src) 98.6 F (37 C) (Axillary)  Resp 11  Ht 5\' 10"  (1.778 m)  Wt 113.9 kg (251 lb 1.7 oz)  BMI 36.03 kg/m2  SpO2 95%  GENERAL:  Obese white  male in NAD. HEENT:  EOMI.  Oropharynx is clear.  NECK:  Supple.  No thyromegaly.  No neck tenderness.  CARDIAC:  Regular rate and rhythm without murmur.  PULMONARY:  Clear to auscultation bilaterally.  ABDOMEN:  Diffusely soft. +Tenderness. Decreased BS.   EXTREMITIES:  No peripheral edema is present.    SKIN:  No rash or dermatopathy. No xanthomata.  NEUROLOGIC:  No dysarthria.  No tremor. PSYCHIATRIC:  Alert and oriented, calm, cooperative.   LABORATORY DATA:  Results for orders placed or performed during the hospital encounter of 01/17/16 (from the  past 24 hour(s))  Glucose, capillary     Status: None   Collection Time: 01/18/16  1:36 PM  Result Value Ref Range   Glucose-Capillary 82 65 - 99 mg/dL  Glucose, capillary     Status: None   Collection Time: 01/18/16  2:32 PM  Result Value Ref Range   Glucose-Capillary 67 65 - 99 mg/dL  Glucose, capillary     Status: None   Collection Time: 01/18/16  2:58 PM  Result Value Ref Range   Glucose-Capillary 65 65 - 99 mg/dL  Glucose, capillary     Status: None   Collection Time: 01/18/16  3:30 PM  Result Value Ref Range   Glucose-Capillary 75 65 - 99 mg/dL  Glucose, capillary     Status: None   Collection Time: 01/18/16  4:31 PM  Result Value Ref Range   Glucose-Capillary 73 65 - 99 mg/dL  Glucose, capillary     Status: None   Collection Time: 01/18/16  6:30 PM  Result Value Ref Range   Glucose-Capillary 87 65 - 99 mg/dL  Glucose, capillary     Status: None   Collection Time: 01/18/16  7:32 PM  Result Value Ref Range   Glucose-Capillary 77 65 - 99 mg/dL  Triglycerides     Status: Abnormal   Collection Time: 01/18/16  7:37 PM  Result Value Ref Range   Triglycerides >5000 (H) <150 mg/dL  Potassium     Status: None   Collection Time: 01/18/16  7:37 PM  Result Value Ref Range   Potassium 3.7 3.5 - 5.1 mmol/L  Glucose, capillary     Status: Abnormal   Collection Time: 01/18/16  9:39 PM  Result Value Ref Range   Glucose-Capillary 60 (L) 65 - 99 mg/dL  Glucose, capillary     Status: Abnormal   Collection Time: 01/18/16 10:37 PM  Result Value Ref Range   Glucose-Capillary 121 (H) 65 - 99 mg/dL  Glucose, capillary     Status: Abnormal   Collection Time: 01/19/16 12:09 AM  Result Value Ref Range   Glucose-Capillary 107 (H) 65 - 99 mg/dL  Glucose, capillary     Status: Abnormal   Collection Time: 01/19/16  2:04 AM  Result Value Ref Range   Glucose-Capillary 110 (H) 65 - 99 mg/dL  Glucose, capillary     Status: Abnormal   Collection Time: 01/19/16  4:05 AM  Result Value Ref Range    Glucose-Capillary 106 (H) 65 - 99 mg/dL  Triglycerides     Status: Abnormal   Collection Time: 01/19/16  4:23 AM  Result Value Ref Range   Triglycerides 4924 (H) <150 mg/dL  Basic metabolic panel  Status: Abnormal   Collection Time: 01/19/16  4:23 AM  Result Value Ref Range   Sodium 128 (L) 135 - 145 mmol/L   Potassium UNABLE TO REPORT DUE TO LIPEMIC INTERFERENCE 3.5 - 5.1 mmol/L   Chloride 94 (L) 101 - 111 mmol/L   CO2 28 22 - 32 mmol/L   Glucose, Bld 101 (H) 65 - 99 mg/dL   BUN <5 (L) 6 - 20 mg/dL   Creatinine, Ser 0.85 0.61 - 1.24 mg/dL   Calcium 8.5 (L) 8.9 - 10.3 mg/dL   GFR calc non Af Amer >60 >60 mL/min   GFR calc Af Amer >60 >60 mL/min   Anion gap 6 5 - 15  Glucose, capillary     Status: Abnormal   Collection Time: 01/19/16  7:40 AM  Result Value Ref Range   Glucose-Capillary 109 (H) 65 - 99 mg/dL  Glucose, capillary     Status: Abnormal   Collection Time: 01/19/16  8:32 AM  Result Value Ref Range   Glucose-Capillary 111 (H) 65 - 99 mg/dL  Glucose, capillary     Status: Abnormal   Collection Time: 01/19/16  9:47 AM  Result Value Ref Range   Glucose-Capillary 100 (H) 65 - 99 mg/dL  Glucose, capillary     Status: Abnormal   Collection Time: 01/19/16 10:38 AM  Result Value Ref Range   Glucose-Capillary 106 (H) 65 - 99 mg/dL  Glucose, capillary     Status: Abnormal   Collection Time: 01/19/16 11:36 AM  Result Value Ref Range   Glucose-Capillary 117 (H) 65 - 99 mg/dL  Triglycerides     Status: Abnormal   Collection Time: 01/19/16 11:57 AM  Result Value Ref Range   Triglycerides 4469 (H) <150 mg/dL   Lab Results  Component Value Date   HGBA1C 9.7* 01/16/2016     ASSESSMENT:  1.Acute pancreatitis due to hypertriglyceridemia. 2. Familial hypertriglyceridemia 3. Type 2 diabetes with hyperglycemia, uncontrolled 4. Type 2 diabetes with peripheral neuropathy 5. Obesity (BMI 36)  PLAN: 1. I recommend continued use of IV insulin and IV dextrose.  Aim to keep sugars in the 100-180 range.  2. Keep NPO 3. Trend triglycerides. He likely will need IV insulin until serum Tg level <1000. 4. Will obtain a repeat Hgb A1c.  5. Continue fibrate + fish oil for dyslipidemia. 6. He will need to restart insulin pump once ready to transition off the IV insulin or once her has pump supplies, if available.   I will follow along with you.

## 2016-01-19 NOTE — Progress Notes (Signed)
  Subjective:  1 - Urinary retention with hypocontractile bladder - pt with infrequent voids noted durign admission 01/2016 for recurrent pancreatitis. H/o L spine disease / surgery with chronic lower extremity neuropathy. Denies any significant chance in voiding behavior. Bedside cysto 01/17/16 reveals wide open urethra and bladder neck, 800cc urine evacuated with foley placement suggesting hypocontractility as principal cause of retention, not recurrent stricture. Current Cr <0.5.   2 - History of urethral stricture - s/p bedside dilation procedure at [place] in [facility] 2016 for what sound like urethral false pass peri-operatively at time of gastric sleeve. Cysto 01/2016 without stricture whatsoever.  Today "Sergio [name]" is stable. Improving hyponatremia and abd pain somewhat. Copious UOP. Cr stable.   Objective: Vital signs in last 24 hours: Temp:  [98.3 F (36.8 C)-100.6 F (38.1 C)] 98.6 F (37 C) (04/10 0800) Pulse Rate:  [86-102] 91 (04/10 0800) Resp:  [5-21] 15 (04/10 0800) BP: (96-134)/(51-91) 134/88 mmHg (04/10 0800) SpO2:  [92 %-100 %] 100 % (04/10 0800) Weight:  [113.9 kg (251 lb 1.7 oz)] 113.9 kg (251 lb 1.7 oz) (04/10 0602) Last BM Date: 01/16/16  Intake/Output from previous day: 04/09 0701 - 04/10 0700 In: 3064.7 [I.V.:3064.7] Out: 3750 [Urine:3750] Intake/Output this shift: Total I/O In: 137 [I.V.:137] Out: 400 [Urine:400]  General appearance: alert, cooperative and appears stated age Head: Normocephalic, without obvious abnormality, atraumatic Back: symmetric, no curvature. ROM normal. No CVA tenderness. Resp: non-labored on CPAP Cardio: Nl rate by bedside monitor GI: soft, non-tender; bowel sounds normal; no masses,  no organomegaly Male genitalia: normal, foley c/d/i with copious clear yellow urine.  Extremities: extremities normal, atraumatic, no cyanosis or edema Skin: Skin color, texture, turgor normal. No rashes or lesions Lymph nodes: Cervical,  supraclavicular, and axillary nodes normal. Neurologic: Grossly normal  Lab Results:   Recent Labs  01/16/16 2310  WBC 7.1  HGB 12.5*  HCT 39.3*  PLT 257   BMET  Recent Labs  01/18/16 0638 01/18/16 1937 01/19/16 0423  NA 124*  --  128*  K 3.3* 3.7 UNABLE TO REPORT DUE TO LIPEMIC INTERFERENCE  CL 91*  --  94*  CO2 26  --  28  GLUCOSE 87  --  101*  BUN 6  --  <5*  CREATININE 0.56*  --  0.85  CALCIUM 7.9*  --  8.5*   PT/INR No results for input(s): LABPROT, INR in the last 72 hours. ABG No results for input(s): PHART, HCO3 in the last 72 hours.  Invalid input(s): PCO2, PO2  Studies/Results: No results found.  Anti-infectives: Anti-infectives    None      Assessment/Plan:  1 - Urinary retention with hypocontractile bladder - this appears to be chronic problem likely related to neuropathy from prior back surgery and diabetes. No anatomic blockage with cysto.  I feel acceptable to remove current foley prior to discharge when not needed for accurate UOP monitoring and pt can f/u with his Urologsit Dr. Jacqlyn Larsen or we are happy to see with [place].   2 - History of urethral stricture - no recurrence by bedside cysto.  3 - We will follow prn going forward. Please call me directly with any questions.   [facility], Sergio [name] 01/19/2016

## 2016-01-19 NOTE — Progress Notes (Signed)
MEDICATION RELATED CONSULT NOTE -Follow up  Pharmacy Consult for Insulin drip for hypertriglyceridemia; electrolyte management.    Allergies  Allergen Reactions  . Codeine Anaphylaxis  . Ivp Dye [Iodinated Diagnostic Agents] Other (See Comments)    Kidneys stop working    Patient Measurements: Height: 5\' 10"  (177.8 cm) Weight: 251 lb 1.7 oz (113.9 kg) IBW/kg (Calculated) : 73   Vital Signs: Temp: 98.6 F (37 C) (04/10 0800) BP: 128/81 mmHg (04/10 1400) Pulse Rate: 85 (04/10 1400)  Lipid Panel     Component Value Date/Time   CHOL 969* 01/16/2016 2310   CHOL 357* 09/04/2014 0328   CHOL 1116* 04/27/2012 0939   TRIG 4469* 01/19/2016 1157   TRIG 1201* 11/04/2014 0427   HDL 21* 01/16/2016 2310   HDL SEE COMMENT 09/04/2014 0328   HDL 60 04/27/2012 0939   CHOLHDL NOT REPORTED DUE TO HIGH TRIGLYCERIDES 01/16/2016 2310   CHOLHDL 18.6* 04/27/2012 0939   VLDL UNABLE TO CALCULATE IF TRIGLYCERIDE OVER 400 mg/dL 01/16/2016 2310   VLDL SEE COMMENT 09/04/2014 0328   LDLCALC UNABLE TO CALCULATE IF TRIGLYCERIDE OVER 400 mg/dL 01/16/2016 2310   LDLCALC SEE COMMENT 09/04/2014 0328   LDLCALC Comment 04/27/2012 0939   LDLDIRECT 111.4 06/30/2012 0950   Medical History: Past Medical History  Diagnosis Date  . Recurrent acute pancreatitis     secondary to hypertriglyceridemia   . Gastric ulcer 2009  . Sleep apnea 1999    uses CPAP  . Chicken pox   . Diabetes mellitus   . HTN (hypertension)   . Familial hypertriglyceridemia     severe  . Morbid obesity ([place])     s/p bariatric sleeve surgery 01/2015  . Anxiety   . Depression    4/9 0638 Triglycerides still > 5000  Assessment: 40 yo male with chronic pancreatitis secondary to familial hypertriglyceridemia.  Pharmacy was consulted for initiation of insulin drip for hypertriglyceridemia.Patient is also receiving D10/0.45%NS with 74mEq of potassium at 11mL/hr.   Goal of Therapy:  Discontinue drip when Triglyceridemia <  [phone] Blood Glucose 100-180.   Plan:  Continue insulin drip at 12 units/hr. Continue D10/0.45%NS with 22mEq of potassium at 171mL/hr.   Potassium is unavailable due to lipemic interference. Will continue with q12hr potassium checks. Will order magnesium and phosphorus with 1700 labs.   Continue triglyceride checks Q8hr.   Pharmacy will continue to monitor and adjust per consult.   Currie Paris   01/19/2016,4:13 PM

## 2016-01-19 NOTE — Care Management (Signed)
Patient  admitted with chronic pancreatitis and found to have triglyceride of > 5000.  He is on insulin drip. Uses insulin pump.  Has been seen by endocrinology

## 2016-01-19 NOTE — Progress Notes (Signed)
Patient has rested well throughout the night.  He has complained of some abdominal pain that has been helped by PRN medication.  Vital signs have remained stable and urine output has been adequate.  Report given to day shift RN.

## 2016-01-19 NOTE — Progress Notes (Signed)
Triglyerides are trending down. Remains in a lot of abdominal pain.

## 2016-01-19 NOTE — Progress Notes (Signed)
[facility] at [place] NAME: Sergio [name]    MR#:  [id number]  DATE OF BIRTH:  [birth year]  SUBJECTIVE:  CHIEF COMPLAINT:   Chief Complaint  Patient presents with  . Abdominal Pain   -Admitted for pancreatitis related to hypertriglyceridemia -Abdominal pain and nausea, vomiting are improving. Triglycerides for the first time are less than 5000. There are at 4900 today. -Sugars have been on the lower side while on the insulin drip. D10 drip had to be increased to 125 cc/h  REVIEW OF SYSTEMS:  Review of Systems  Constitutional: Positive for malaise/fatigue. Negative for fever and chills.  HENT: Negative for ear discharge, ear pain and nosebleeds.   Eyes: Negative for blurred vision, double vision and photophobia.  Respiratory: Negative for cough, shortness of breath and wheezing.   Cardiovascular: Negative for chest pain and palpitations.  Gastrointestinal: Positive for abdominal pain. Negative for nausea, vomiting, diarrhea and constipation.       Improving abdominal pain  Genitourinary: Negative for dysuria and urgency.       Urinary retention  Musculoskeletal: Negative for myalgias and neck pain.  Neurological: Negative for dizziness, sensory change, speech change, focal weakness, seizures and headaches.  Psychiatric/Behavioral: Negative for depression.    DRUG ALLERGIES:   Allergies  Allergen Reactions  . Codeine Anaphylaxis  . Ivp Dye [Iodinated Diagnostic Agents] Other (See Comments)    Kidneys stop working    VITALS:  Blood pressure 117/65, pulse 94, temperature 99.1 F (37.3 C), temperature source Axillary, resp. rate 8, height 5\' 10"  (1.778 m), weight 113.9 kg (251 lb 1.7 oz), SpO2 96 %.  PHYSICAL EXAMINATION:  Physical Exam  GENERAL:  40 y.o.-year-old patient lying in the bed with minimal distress due to abdominal pain.  EYES: Pupils equal, round, reactive to light and accommodation. No scleral icterus.  Extraocular muscles intact.  HEENT: Head atraumatic, normocephalic. Oropharynx and nasopharynx clear.  NECK:  Supple, no jugular venous distention. No thyroid enlargement, no tenderness.  LUNGS: Normal breath sounds bilaterally, no wheezing, rales,rhonchi or crepitation. No use of accessory muscles of respiration.  CARDIOVASCULAR: S1, S2 normal. No murmurs, rubs, or gallops.  ABDOMEN: Soft, but tender to palpation in the epigastric and left upper quadrant areas, nondistended. Bowel sounds present. No organomegaly or mass.  EXTREMITIES: No pedal edema, cyanosis, or clubbing.  NEUROLOGIC: Cranial nerves II through XII are intact. Muscle strength 5/5 in all extremities. Sensation intact. Gait not checked.  PSYCHIATRIC: The patient is alert and oriented x 3.  SKIN: No obvious rash, lesion, or ulcer.    LABORATORY PANEL:   CBC  Recent Labs Lab 01/16/16 2310  WBC 7.1  HGB 12.5*  HCT 39.3*  PLT 257   ------------------------------------------------------------------------------------------------------------------  Chemistries   Recent Labs Lab 01/16/16 2310  01/19/16 0423  NA 116*  < > 128*  K 3.0*  < > UNABLE TO REPORT DUE TO LIPEMIC INTERFERENCE  CL 83*  < > 94*  CO2 23  < > 28  GLUCOSE 201*  < > 101*  BUN UNABLE TO REPORT DUE TO LIPEMIC INTERFERENCE  < > <5*  CREATININE UNABLE TO REPORT DUE TO LIPEMIC INTERFERENCE  < > 0.85  CALCIUM 8.6*  < > 8.5*  AST UNABLE TO PERFORM DUE TO LIPEMIC INTERFERENCE  --   --   ALT UNABLE TO PERFORM DUE TO LIPEMIC INTERFERENCE  --   --   ALKPHOS 45  --   --   BILITOT <0.1*  --   --   < > =  values in this interval not displayed. ------------------------------------------------------------------------------------------------------------------  Cardiac Enzymes No results for input(s): TROPONINI in the last 168 hours. ------------------------------------------------------------------------------------------------------------------  RADIOLOGY:   No results found.  EKG:   Orders placed or performed during the hospital encounter of 01/17/16  . EKG 12-Lead  . EKG 12-Lead    ASSESSMENT AND PLAN:   40 year old male with past medical history significant for type 1 diabetes mellitus, hypertension, hypertriglyceridemia causing acute pancreatitis episodes presents to the hospital secondary to abdominal pain nausea and vomiting.  #1 Acute pancreatitis-related to hypertriglyceridemia again. -Keep nothing by mouth, supportive medications for nausea and abdominal pain. -IV fluids and get triglycerides controlled. - If symptoms are better later today, will start on clear liquid diet.  #2 hypertriglyceridemia-unsure if has family history as patient is adopted. -Triglycerides greater than 5000. on insulin drip. Recheck triglycerides and cut the drip to half if the triglycerides are less than 1500. -Also D10 drip started while on insulin drip as sugars are on the lower side -Restarted gemfibrozil and Crestor - Endocrinology consult for managing insulin drip and D10 drip  #3 hyponatremia-likely pseudohyponatremia secondary to hypertriglyceridemia. Also patient receiving D10 drip -Monitor sodium levels. Improving today  #4 hypokalemia-replaced aggressively especially as patient on insulin drip.  #5 obstructive sleep apnea-continue CPAP  #6 depression-continue home medications- on trazodone, Zoloft and Xanax  #7 DVT prophylaxis-on Lovenox   All the records are reviewed and case discussed with Care Management/Social Workerr. Management plans discussed with the patient, family and they are in agreement.  CODE STATUS: Full code  TOTAL TIME TAKING CARE OF THIS PATIENT: 38 minutes.   POSSIBLE D/C IN 2-3 DAYS, DEPENDING ON CLINICAL CONDITION.   Gladstone Lighter M.D on 01/19/2016 at 7:50 AM  Between 7am to 6pm - Pager - [phone]  After 6pm go to [email or web address] - password [email or web address] [facility]  Office   [phone]  CC: Primary care physician; Crecencio Mc, MD

## 2016-01-19 NOTE — Progress Notes (Addendum)
MEDICATION RELATED CONSULT NOTE -Follow up  Pharmacy Consult for Insulin drip for hypertriglyceridemia; electrolyte management.    Allergies  Allergen Reactions  . Codeine Anaphylaxis  . Ivp Dye [Iodinated Diagnostic Agents] Other (See Comments)    Kidneys stop working    Patient Measurements: Height: 5\' 10"  (177.8 cm) Weight: 251 lb 1.7 oz (113.9 kg) IBW/kg (Calculated) : 73   Vital Signs: Temp: 98.3 F (36.8 C) (04/10 1945) Temp Source: Oral (04/10 1945) BP: 122/83 mmHg (04/10 1900) Pulse Rate: 82 (04/10 1900)  Lipid Panel     Component Value Date/Time   CHOL 969* 01/16/2016 2310   CHOL 357* 09/04/2014 0328   CHOL 1116* 04/27/2012 0939   TRIG 4469* 01/19/2016 1157   TRIG 1201* 11/04/2014 0427   HDL 21* 01/16/2016 2310   HDL SEE COMMENT 09/04/2014 0328   HDL 60 04/27/2012 0939   CHOLHDL NOT REPORTED DUE TO HIGH TRIGLYCERIDES 01/16/2016 2310   CHOLHDL 18.6* 04/27/2012 0939   VLDL UNABLE TO CALCULATE IF TRIGLYCERIDE OVER 400 mg/dL 01/16/2016 2310   VLDL SEE COMMENT 09/04/2014 0328   LDLCALC UNABLE TO CALCULATE IF TRIGLYCERIDE OVER 400 mg/dL 01/16/2016 2310   LDLCALC SEE COMMENT 09/04/2014 0328   LDLCALC Comment 04/27/2012 0939   LDLDIRECT 111.4 06/30/2012 0950   Medical History: Past Medical History  Diagnosis Date  . Recurrent acute pancreatitis     secondary to hypertriglyceridemia   . Gastric ulcer 2009  . Sleep apnea 1999    uses CPAP  . Chicken pox   . Diabetes mellitus   . HTN (hypertension)   . Familial hypertriglyceridemia     severe  . Morbid obesity ([place])     s/p bariatric sleeve surgery 01/2015  . Anxiety   . Depression    4/9 0638 Triglycerides still > 5000  Assessment: 40 yo male with chronic pancreatitis secondary to familial hypertriglyceridemia.  Pharmacy was consulted for initiation of insulin drip for hypertriglyceridemia.Patient is also receiving D10/0.45%NS with 40mEq of potassium at 130mL/hr.   Goal of Therapy:  Discontinue drip  when Triglyceridemia < [phone] Blood Glucose 100-180.   Plan:  Continue insulin drip at 12 units/hr. Continue D10/0.45%NS with 37mEq of potassium at 17mL/hr.   Potassium is unavailable due to lipemic interference. Will continue with q12hr potassium checks. Will order magnesium and phosphorus with 1700 labs.   Continue triglyceride checks Q8hr.   Pharmacy will continue to monitor and adjust per consult.   4/10 @ 1800; Mag and Phos WNL, potassium 5.3, however sample post-ultracentrifugation and hemolyzed, likely falsely high. Discussed with Dr. Verdell Carmine. No changes at this time. Continue Q 12hr potassium checks  Rexene Edison, PharmD Clinical Pharmacist  01/19/2016 7:55 PM

## 2016-01-20 LAB — TRIGLYCERIDES
TRIGLYCERIDES: 3258 mg/dL — AB (ref <150)
Triglycerides: 2661 mg/dL — ABNORMAL HIGH (ref <150)

## 2016-01-20 LAB — BASIC METABOLIC PANEL
Anion gap: 11 (ref 5–15)
CHLORIDE: 95 mmol/L — AB (ref 101–111)
CO2: 25 mmol/L (ref 22–32)
Calcium: 9 mg/dL (ref 8.9–10.3)
Creatinine, Ser: 0.64 mg/dL (ref 0.61–1.24)
GFR calc Af Amer: >60 mL/min (ref >60)
GFR calc non Af Amer: >60 mL/min (ref >60)
GLUCOSE: 192 mg/dL — AB (ref 65–99)
POTASSIUM: 4.5 mmol/L (ref 3.5–5.1)
Sodium: 131 mmol/L — ABNORMAL LOW (ref 135–145)

## 2016-01-20 LAB — POTASSIUM: POTASSIUM: 3.9 mmol/L (ref 3.5–5.1)

## 2016-01-20 LAB — GLUCOSE, CAPILLARY
Glucose-Capillary: 129 mg/dL — ABNORMAL HIGH (ref 65–99)
Glucose-Capillary: 130 mg/dL — ABNORMAL HIGH (ref 65–99)
Glucose-Capillary: 136 mg/dL — ABNORMAL HIGH (ref 65–99)
Glucose-Capillary: 136 mg/dL — ABNORMAL HIGH (ref 65–99)
Glucose-Capillary: 141 mg/dL — ABNORMAL HIGH (ref 65–99)
Glucose-Capillary: 145 mg/dL — ABNORMAL HIGH (ref 65–99)
Glucose-Capillary: 150 mg/dL — ABNORMAL HIGH (ref 65–99)
Glucose-Capillary: 151 mg/dL — ABNORMAL HIGH (ref 65–99)
Glucose-Capillary: 155 mg/dL — ABNORMAL HIGH (ref 65–99)
Glucose-Capillary: 158 mg/dL — ABNORMAL HIGH (ref 65–99)
Glucose-Capillary: 160 mg/dL — ABNORMAL HIGH (ref 65–99)
Glucose-Capillary: 162 mg/dL — ABNORMAL HIGH (ref 65–99)
Glucose-Capillary: 176 mg/dL — ABNORMAL HIGH (ref 65–99)
Glucose-Capillary: 180 mg/dL — ABNORMAL HIGH (ref 65–99)
Glucose-Capillary: 191 mg/dL — ABNORMAL HIGH (ref 65–99)
Glucose-Capillary: 202 mg/dL — ABNORMAL HIGH (ref 65–99)

## 2016-01-20 LAB — MAGNESIUM: Magnesium: 1.9 mg/dL (ref 1.7–2.4)

## 2016-01-20 LAB — PHOSPHORUS: Phosphorus: 4.1 mg/dL (ref 2.5–4.6)

## 2016-01-20 MED ORDER — HYDROMORPHONE HCL 1 MG/ML IJ SOLN
1.0000 mg | Freq: Once | INTRAMUSCULAR | Status: AC
  Administered 2016-01-20: 1 mg via INTRAVENOUS
  Filled 2016-01-20: qty 1

## 2016-01-20 NOTE — Progress Notes (Signed)
Chaplain rounded the united and offered a compassionate presence and support to the patient and family. Chaplain Calissa Swenor [phone] 

## 2016-01-20 NOTE — Progress Notes (Signed)
Endocrinology follow up  History of present illness Sergio [name] is a 40 y.o. male seen in follow up for acute pancreatitis due to severe hypertriglyceridemia. He c/o nausea with emesis yesterday. Appetite poor. He remains NPO. Abd pain remains 7/10. Triglycerides slowly improving. Sugars are in the 100 - 180 range. Father at bedside.    Medical history Past Medical History  Diagnosis Date  . Recurrent acute pancreatitis     secondary to hypertriglyceridemia   . Gastric ulcer 2009  . Sleep apnea 1999    uses CPAP  . Chicken pox   . Diabetes mellitus   . HTN (hypertension)   . Familial hypertriglyceridemia     severe  . Morbid obesity ([place])     s/p bariatric sleeve surgery 01/2015  . Anxiety   . Depression      Surgical history Past Surgical History  Procedure Laterality Date  . Tonsillectomy  2003  . Vasectomy    . Lumbar disc surgery  2008  . Cholecystectomy    . Laparoscopic gastric sleeve resection       Medications . docusate sodium  100 mg Oral BID  . enoxaparin (LOVENOX) injection  40 mg Subcutaneous Q24H  . gemfibrozil  600 mg Oral BID AC  . omega-3 acid ethyl esters  4 g Oral Daily  . pregabalin  300 mg Oral Daily  . rosuvastatin  20 mg Oral q1800  . sertraline  50 mg Oral Daily  . sodium chloride flush  3 mL Intravenous Q12H  . tamsulosin  0.4 mg Oral Daily  . traZODone  100 mg Oral QHS     Social history Social History  Substance Use Topics  . Smoking status: Never Smoker   . Smokeless tobacco: Never Used  . Alcohol Use: No     Comment: once month     Family history Family History  Problem Relation Age of Onset  . Adopted: Yes  . Hypertension Mother   . Hyperlipidemia Mother   . Hypertension Father   . Hyperlipidemia Father      Review of systems CV: no chest pain or palpitations PULM: no cough or shortness of breath GU: +urinary retention with Foley catheter in pace  Physical Exam BP 109/65 mmHg  Pulse 79  Temp(Src) 96.9 F  (36.1 C) (Axillary)  Resp 11  Ht 5\' 10"  (1.778 m)  Wt 110.1 kg (242 lb 11.6 oz)  BMI 34.83 kg/m2  SpO2 94%  GEN: Obese white  male, in NAD. HEENT: No proptosis, EOMI, lid lag or stare. Oropharynx is clear.  NECK: supple, trachea midline.   RESPIRATORY: clear bilaterally, no wheeze, good inspiratory effort. CV: No carotid bruits, RRR. MUSCULOSKELETAL: normal gait and station, no clubbing, no tremor ABD: soft, + diffusely tender, decreased BS EXT: no peripheral edema SKIN: no dermatopathy or rash or acanthosis nigricans. No xanthomata. LYMPH: no submandibular or supraclavicular LAD PSYC: alert and oriented, good insight  Labs Results for orders placed or performed during the hospital encounter of 01/17/16 (from the past 24 hour(s))  Glucose, capillary     Status: Abnormal   Collection Time: 01/19/16  5:38 PM  Result Value Ref Range   Glucose-Capillary 111 (H) 65 - 99 mg/dL  Glucose, capillary     Status: Abnormal   Collection Time: 01/19/16  6:50 PM  Result Value Ref Range   Glucose-Capillary 126 (H) 65 - 99 mg/dL  Triglycerides     Status: Abnormal   Collection Time: 01/19/16  7:52 PM  Result Value Ref Range   Triglycerides 3614 (H) <150 mg/dL  Glucose, capillary     Status: Abnormal   Collection Time: 01/19/16  8:11 PM  Result Value Ref Range   Glucose-Capillary 118 (H) 65 - 99 mg/dL  Basic metabolic panel     Status: Abnormal   Collection Time: 01/19/16  9:01 PM  Result Value Ref Range   Sodium 133 (L) 135 - 145 mmol/L   Potassium 4.6 3.5 - 5.1 mmol/L   Chloride 97 (L) 101 - 111 mmol/L   CO2 22 22 - 32 mmol/L   Glucose, Bld 124 (H) 65 - 99 mg/dL   BUN <5 (L) 6 - 20 mg/dL   Creatinine, Ser 0.71 0.61 - 1.24 mg/dL   Calcium 9.0 8.9 - 10.3 mg/dL   GFR calc non Af Amer >60 >60 mL/min   GFR calc Af Amer >60 >60 mL/min   Anion gap 14 5 - 15  Glucose, capillary     Status: Abnormal   Collection Time: 01/19/16  9:58 PM  Result Value Ref Range   Glucose-Capillary 143 (H)  65 - 99 mg/dL  Glucose, capillary     Status: Abnormal   Collection Time: 01/20/16 12:24 AM  Result Value Ref Range   Glucose-Capillary 176 (H) 65 - 99 mg/dL  Triglycerides     Status: Abnormal   Collection Time: 01/20/16  3:34 AM  Result Value Ref Range   Triglycerides 3258 (H) <150 mg/dL  Basic metabolic panel     Status: Abnormal   Collection Time: 01/20/16  3:34 AM  Result Value Ref Range   Sodium 131 (L) 135 - 145 mmol/L   Potassium 4.5 3.5 - 5.1 mmol/L   Chloride 95 (L) 101 - 111 mmol/L   CO2 25 22 - 32 mmol/L   Glucose, Bld 192 (H) 65 - 99 mg/dL   BUN <5 (L) 6 - 20 mg/dL   Creatinine, Ser 0.64 0.61 - 1.24 mg/dL   Calcium 9.0 8.9 - 10.3 mg/dL   GFR calc non Af Amer >60 >60 mL/min   GFR calc Af Amer >60 >60 mL/min   Anion gap 11 5 - 15  Magnesium     Status: None   Collection Time: 01/20/16  3:34 AM  Result Value Ref Range   Magnesium 1.9 1.7 - 2.4 mg/dL  Phosphorus     Status: None   Collection Time: 01/20/16  3:34 AM  Result Value Ref Range   Phosphorus 4.1 2.5 - 4.6 mg/dL  Glucose, capillary     Status: Abnormal   Collection Time: 01/20/16  3:55 AM  Result Value Ref Range   Glucose-Capillary 191 (H) 65 - 99 mg/dL  Glucose, capillary     Status: Abnormal   Collection Time: 01/20/16  6:10 AM  Result Value Ref Range   Glucose-Capillary 180 (H) 65 - 99 mg/dL  Glucose, capillary     Status: Abnormal   Collection Time: 01/20/16  7:49 AM  Result Value Ref Range   Glucose-Capillary 202 (H) 65 - 99 mg/dL  Glucose, capillary     Status: Abnormal   Collection Time: 01/20/16  9:22 AM  Result Value Ref Range   Glucose-Capillary 158 (H) 65 - 99 mg/dL  Glucose, capillary     Status: Abnormal   Collection Time: 01/20/16 10:22 AM  Result Value Ref Range   Glucose-Capillary 162 (H) 65 - 99 mg/dL  Glucose, capillary     Status: Abnormal   Collection Time: 01/20/16 11:31 AM  Result Value Ref Range   Glucose-Capillary 160 (H) 65 - 99 mg/dL  Glucose, capillary     Status:  Abnormal   Collection Time: 01/20/16  1:36 PM  Result Value Ref Range   Glucose-Capillary 136 (H) 65 - 99 mg/dL  Glucose, capillary     Status: Abnormal   Collection Time: 01/20/16  2:39 PM  Result Value Ref Range   Glucose-Capillary 145 (H) 65 - 99 mg/dL  Glucose, capillary     Status: Abnormal   Collection Time: 01/20/16  3:26 PM  Result Value Ref Range   Glucose-Capillary 136 (H) 65 - 99 mg/dL  Glucose, capillary     Status: Abnormal   Collection Time: 01/20/16  4:45 PM  Result Value Ref Range   Glucose-Capillary 129 (H) 65 - 99 mg/dL    Lab Results  Component Value Date   HGBA1C 9.7* 01/16/2016     Assessment 1.Acute pancreatitis due to hypertriglyceridemia. 2. Familial hypertriglyceridemia 3. Type 2 diabetes with hyperglycemia, uncontrolled 4. Type 2 diabetes with peripheral neuropathy 5. Obesity (BMI 36)2  Plan 1. Continue use of IV insulin and IV dextrose. Aim to keep sugars in the 100-180 range.  2. Keep NPO until off the IV insulin. 3. Trend triglycerides. Continue IV insulin until serum Tg level <1000. 4. Continue fibrate + fish oil + statin. 5. Patient now has received pump supplies. Will likely have him restart the pump before hospital discharge. Perhaps will transition to pump once we are able to stop the IV insulin. Wife will need to bring in those supplies, when we are ready.  I will follow along with you.

## 2016-01-20 NOTE — Plan of Care (Addendum)
Problem: Nutrition: Goal: Adequate nutrition will be maintained Outcome: Not Progressing Patient NPO except for ice chips and medications.

## 2016-01-20 NOTE — Progress Notes (Signed)
MEDICATION RELATED CONSULT NOTE -Follow up  Pharmacy Consult for Insulin drip for hypertriglyceridemia; electrolyte management.    Allergies  Allergen Reactions  . Codeine Anaphylaxis  . Ivp Dye [Iodinated Diagnostic Agents] Other (See Comments)    Kidneys stop working    Patient Measurements: Height: 5\' 10"  (177.8 cm) Weight: 242 lb 11.6 oz (110.1 kg) IBW/kg (Calculated) : 73   Vital Signs: Temp: 97.8 F (36.6 C) (04/11 1130) Temp Source: Oral (04/11 1130) BP: 121/77 mmHg (04/11 1401) Pulse Rate: 85 (04/11 1401)  Lipid Panel     Component Value Date/Time   CHOL 969* 01/16/2016 2310   CHOL 357* 09/04/2014 0328   CHOL 1116* 04/27/2012 0939   TRIG 3258* 01/20/2016 0334   TRIG 1201* 11/04/2014 0427   HDL 21* 01/16/2016 2310   HDL SEE COMMENT 09/04/2014 0328   HDL 60 04/27/2012 0939   CHOLHDL NOT REPORTED DUE TO HIGH TRIGLYCERIDES 01/16/2016 2310   CHOLHDL 18.6* 04/27/2012 0939   VLDL UNABLE TO CALCULATE IF TRIGLYCERIDE OVER 400 mg/dL 01/16/2016 2310   VLDL SEE COMMENT 09/04/2014 0328   LDLCALC UNABLE TO CALCULATE IF TRIGLYCERIDE OVER 400 mg/dL 01/16/2016 2310   LDLCALC SEE COMMENT 09/04/2014 0328   LDLCALC Comment 04/27/2012 0939   LDLDIRECT 111.4 06/30/2012 0950   Medical History: Past Medical History  Diagnosis Date  . Recurrent acute pancreatitis     secondary to hypertriglyceridemia   . Gastric ulcer 2009  . Sleep apnea 1999    uses CPAP  . Chicken pox   . Diabetes mellitus   . HTN (hypertension)   . Familial hypertriglyceridemia     severe  . Morbid obesity ([place])     s/p bariatric sleeve surgery 01/2015  . Anxiety   . Depression     Assessment: 40 yo male with chronic pancreatitis secondary to familial hypertriglyceridemia.  Pharmacy was consulted for initiation of insulin drip for hypertriglyceridemia.Patient is also receiving D10/0.45%NS with 59mEq of potassium at 118mL/hr.   Goal of Therapy:  Discontinue drip when Triglyceridemia <  [phone] Blood Glucose 100-180.   Plan:  Continue insulin drip at 12 units/hr. Continue D10/0.45%NS with 9mEq of potassium at 153mL/hr.   Continue to monitor Potassium Q12hr.   Continue triglyceride checks Q8hr.   Pharmacy will continue to monitor and adjust per consult.   Currie Paris   01/20/2016,2:51 PM

## 2016-01-20 NOTE — Progress Notes (Signed)
[facility] [place] NAME: Sergio [name]    MR#:  [id number]  DATE OF BIRTH:  04/29/1976  SUBJECTIVE:  CHIEF COMPLAINT:   Chief Complaint  Patient presents with  . Abdominal Pain   - Still complains of significant abdominal pain with nausea and vomiting. -Triglycerides slowly improving. Down to 3200 today -Continue nothing by mouth status with ice chips  REVIEW OF SYSTEMS:  Review of Systems  Constitutional: Positive for malaise/fatigue. Negative for fever and chills.  HENT: Negative for ear discharge, ear pain and nosebleeds.   Eyes: Negative for blurred vision, double vision and photophobia.  Respiratory: Negative for cough, shortness of breath and wheezing.   Cardiovascular: Negative for chest pain and palpitations.  Gastrointestinal: Positive for nausea, vomiting and abdominal pain. Negative for diarrhea and constipation.  Genitourinary: Negative for dysuria and urgency.       Urinary retention  Musculoskeletal: Negative for myalgias and neck pain.  Neurological: Negative for dizziness, sensory change, speech change, focal weakness, seizures and headaches.  Psychiatric/Behavioral: Negative for depression.    DRUG ALLERGIES:   Allergies  Allergen Reactions  . Codeine Anaphylaxis  . Ivp Dye [Iodinated Diagnostic Agents] Other (See Comments)    Kidneys stop working    VITALS:  Blood pressure 108/67, pulse 84, temperature 97.8 F (36.6 C), temperature source Axillary, resp. rate 15, height 5\' 10"  (1.778 m), weight 110.1 kg (242 lb 11.6 oz), SpO2 96 %.  PHYSICAL EXAMINATION:  Physical Exam  GENERAL:  40 y.o.-year-old patient lying in the bed with minimal distress due to abdominal pain.  EYES: Pupils equal, round, reactive to light and accommodation. No scleral icterus. Extraocular muscles intact.  HEENT: Head atraumatic, normocephalic. Oropharynx and nasopharynx clear.  NECK:  Supple, no jugular venous distention. No  thyroid enlargement, no tenderness.  LUNGS: Normal breath sounds bilaterally, no wheezing, rales,rhonchi or crepitation. No use of accessory muscles of respiration.  CARDIOVASCULAR: S1, S2 normal. No murmurs, rubs, or gallops.  ABDOMEN: Soft, but tender to palpation in the epigastric and left upper quadrant areas, nondistended. Bowel sounds present. No organomegaly or mass.  EXTREMITIES: No pedal edema, cyanosis, or clubbing.  NEUROLOGIC: Cranial nerves II through XII are intact. Muscle strength 5/5 in all extremities. Sensation intact. Gait not checked.  PSYCHIATRIC: The patient is alert and oriented x 3.  SKIN: No obvious rash, lesion, or ulcer.    LABORATORY PANEL:   CBC  Recent Labs Lab 01/16/16 2310  WBC 7.1  HGB 12.5*  HCT 39.3*  PLT 257   ------------------------------------------------------------------------------------------------------------------  Chemistries   Recent Labs Lab 01/16/16 2310  01/19/16 1448 01/19/16 2101  NA 116*  < >  --  133*  K 3.0*  < > 5.3* 4.6  CL 83*  < >  --  97*  CO2 23  < >  --  22  GLUCOSE 201*  < >  --  124*  BUN UNABLE TO REPORT DUE TO LIPEMIC INTERFERENCE  < >  --  <5*  CREATININE UNABLE TO REPORT DUE TO LIPEMIC INTERFERENCE  < >  --  0.71  CALCIUM 8.6*  < >  --  9.0  MG  --   --  2.1  --   AST UNABLE TO PERFORM DUE TO LIPEMIC INTERFERENCE  --   --   --   ALT UNABLE TO PERFORM DUE TO LIPEMIC INTERFERENCE  --   --   --   ALKPHOS 45  --   --   --  BILITOT <0.1*  --   --   --   < > = values in this interval not displayed. ------------------------------------------------------------------------------------------------------------------  Cardiac Enzymes No results for input(s): TROPONINI in the last 168 hours. ------------------------------------------------------------------------------------------------------------------  RADIOLOGY:  No results found.  EKG:   Orders placed or performed during the hospital encounter of  01/17/16  . EKG 12-Lead  . EKG 12-Lead    ASSESSMENT AND PLAN:   40 year old male with past medical history significant for type 1 diabetes mellitus, hypertension, hypertriglyceridemia causing acute pancreatitis episodes presents to the hospital secondary to abdominal pain nausea and vomiting.  #1 Acute pancreatitis-related to hypertriglyceridemia again. -Keep nothing by mouth, supportive medications for nausea and abdominal pain. -IV fluids and get triglycerides controlled. -Once symptoms improve and triglycerides are down, can start on clear liquids.  #2 Hypertriglyceridemia-unsure if has family history as patient is adopted. -Triglycerides greater than 5000 on admisison. on insulin drip. And D10 drip for sugars - Now improving down to 3258 - continue insulin drip until TG <1000 -Restarted gemfibrozil and Crestor. Crestor dose increased. - Endocrinology consult appreciated  #3 hyponatremia-likely pseudohyponatremia secondary to hypertriglyceridemia. Also patient receiving D10 drip -Monitor sodium levels. Improving now  #4 hypokalemia-replaced aggressively, now normalized. Continue to monitor if we need to back down on the KCl in the fluids  #5 obstructive sleep apnea-continue CPAP  #6 depression-continue home medications- on trazodone, Zoloft and Xanax  #7 DVT prophylaxis-on Lovenox   All the records are reviewed and case discussed with Care Management/Social Workerr. Management plans discussed with the patient, family and they are in agreement.  CODE STATUS: Full code  TOTAL TIME TAKING CARE OF THIS PATIENT: 32 minutes.   POSSIBLE D/C IN 2-3 DAYS, DEPENDING ON CLINICAL CONDITION.   Gladstone Lighter M.D on 01/20/2016 at 8:35 AM  Between 7am to 6pm - Pager - [phone]  After 6pm go to [email or web address] - password [email or web address] [facility]  Office  [phone]  CC: Primary care physician; Crecencio Mc, MD

## 2016-01-20 NOTE — Plan of Care (Signed)
Problem: Nutrition: Goal: Adequate nutrition will be maintained Outcome: Not Progressing Patient NPO currently per MD order. Allowed ice chips today, patient did not want many throughout shift. Nauseous and had abdominal pain mutliple times per shift, helped by prn zofran and dilaudid.  Problem: Bowel/Gastric: Goal: Will not experience complications related to bowel motility Outcome: Not Progressing No bowel movement since 4/7 but, as discussed in interdisciplinary round, patient refuses stool softner ("i'm not eating so I don't need it).

## 2016-01-20 NOTE — Progress Notes (Signed)
MEDICATION RELATED CONSULT NOTE -Follow up  Pharmacy Consult for Insulin drip for hypertriglyceridemia; electrolyte management.    Allergies  Allergen Reactions  . Codeine Anaphylaxis  . Ivp Dye [Iodinated Diagnostic Agents] Other (See Comments)    Kidneys stop working    Patient Measurements: Height: 5\' 10"  (177.8 cm) Weight: 242 lb 11.6 oz (110.1 kg) IBW/kg (Calculated) : 73   Vital Signs: Temp: 96.9 F (36.1 C) (04/11 1557) Temp Source: Axillary (04/11 1557) BP: 109/65 mmHg (04/11 1500) Pulse Rate: 79 (04/11 1557)  Lipid Panel     Component Value Date/Time   CHOL 969* 01/16/2016 2310   CHOL 357* 09/04/2014 0328   CHOL 1116* 04/27/2012 0939   TRIG 3258* 01/20/2016 0334   TRIG 1201* 11/04/2014 0427   HDL 21* 01/16/2016 2310   HDL SEE COMMENT 09/04/2014 0328   HDL 60 04/27/2012 0939   CHOLHDL NOT REPORTED DUE TO HIGH TRIGLYCERIDES 01/16/2016 2310   CHOLHDL 18.6* 04/27/2012 0939   VLDL UNABLE TO CALCULATE IF TRIGLYCERIDE OVER 400 mg/dL 01/16/2016 2310   VLDL SEE COMMENT 09/04/2014 0328   LDLCALC UNABLE TO CALCULATE IF TRIGLYCERIDE OVER 400 mg/dL 01/16/2016 2310   LDLCALC SEE COMMENT 09/04/2014 0328   LDLCALC Comment 04/27/2012 0939   LDLDIRECT 111.4 06/30/2012 0950   Medical History: Past Medical History  Diagnosis Date  . Recurrent acute pancreatitis     secondary to hypertriglyceridemia   . Gastric ulcer 2009  . Sleep apnea 1999    uses CPAP  . Chicken pox   . Diabetes mellitus   . HTN (hypertension)   . Familial hypertriglyceridemia     severe  . Morbid obesity ([place])     s/p bariatric sleeve surgery 01/2015  . Anxiety   . Depression     Assessment: 40 yo male with chronic pancreatitis secondary to familial hypertriglyceridemia.  Pharmacy was consulted for initiation of insulin drip for hypertriglyceridemia.Patient is also receiving D10/0.45%NS with 74mEq of potassium at 168mL/hr.   4/11:  K @ 17:00 = 3.9   Goal of Therapy:  Discontinue drip when  Triglyceridemia < [phone] Blood Glucose 100-180.   Plan:  Continue insulin drip at 12 units/hr. Continue D10/0.45%NS with 54mEq of potassium at 155mL/hr.   Continue to monitor Potassium Q12hr.   Continue triglyceride checks Q8hr.   Pharmacy will continue to monitor and adjust per consult.  Bary Limbach D   01/20/2016,8:07 PM

## 2016-01-21 LAB — GLUCOSE, CAPILLARY
Glucose-Capillary: 108 mg/dL — ABNORMAL HIGH (ref 65–99)
Glucose-Capillary: 111 mg/dL — ABNORMAL HIGH (ref 65–99)
Glucose-Capillary: 111 mg/dL — ABNORMAL HIGH (ref 65–99)
Glucose-Capillary: 114 mg/dL — ABNORMAL HIGH (ref 65–99)
Glucose-Capillary: 115 mg/dL — ABNORMAL HIGH (ref 65–99)
Glucose-Capillary: 120 mg/dL — ABNORMAL HIGH (ref 65–99)
Glucose-Capillary: 121 mg/dL — ABNORMAL HIGH (ref 65–99)
Glucose-Capillary: 126 mg/dL — ABNORMAL HIGH (ref 65–99)
Glucose-Capillary: 132 mg/dL — ABNORMAL HIGH (ref 65–99)
Glucose-Capillary: 133 mg/dL — ABNORMAL HIGH (ref 65–99)
Glucose-Capillary: 134 mg/dL — ABNORMAL HIGH (ref 65–99)
Glucose-Capillary: 140 mg/dL — ABNORMAL HIGH (ref 65–99)
Glucose-Capillary: 146 mg/dL — ABNORMAL HIGH (ref 65–99)
Glucose-Capillary: 147 mg/dL — ABNORMAL HIGH (ref 65–99)
Glucose-Capillary: 148 mg/dL — ABNORMAL HIGH (ref 65–99)
Glucose-Capillary: 154 mg/dL — ABNORMAL HIGH (ref 65–99)
Glucose-Capillary: 157 mg/dL — ABNORMAL HIGH (ref 65–99)
Glucose-Capillary: 169 mg/dL — ABNORMAL HIGH (ref 65–99)

## 2016-01-21 LAB — TRIGLYCERIDES
Triglycerides: 1850 mg/dL — ABNORMAL HIGH (ref <150)
Triglycerides: 1424 mg/dL — ABNORMAL HIGH (ref <150)

## 2016-01-21 LAB — BASIC METABOLIC PANEL
Anion gap: 12 (ref 5–15)
CALCIUM: 8.7 mg/dL — AB (ref 8.9–10.3)
CHLORIDE: 96 mmol/L — AB (ref 101–111)
CO2: 28 mmol/L (ref 22–32)
CREATININE: 0.78 mg/dL (ref 0.61–1.24)
Glucose, Bld: 158 mg/dL — ABNORMAL HIGH (ref 65–99)
Potassium: 3.9 mmol/L (ref 3.5–5.1)
SODIUM: 136 mmol/L (ref 135–145)

## 2016-01-21 LAB — MAGNESIUM: MAGNESIUM: 2 mg/dL (ref 1.7–2.4)

## 2016-01-21 LAB — POTASSIUM: Potassium: 4.3 mmol/L (ref 3.5–5.1)

## 2016-01-21 MED ORDER — ONDANSETRON HCL 4 MG/2ML IJ SOLN
4.0000 mg | Freq: Once | INTRAMUSCULAR | Status: AC
  Administered 2016-01-21: 4 mg via INTRAVENOUS
  Filled 2016-01-21: qty 2

## 2016-01-21 NOTE — Progress Notes (Signed)
MEDICATION RELATED CONSULT NOTE -Follow up  Pharmacy Consult for Insulin drip for hypertriglyceridemia; electrolyte management.    Allergies  Allergen Reactions  . Codeine Anaphylaxis  . Ivp Dye [Iodinated Diagnostic Agents] Other (See Comments)    Kidneys stop working    Patient Measurements: Height: 5\' 10"  (177.8 cm) Weight: 248 lb 10.9 oz (112.8 kg) IBW/kg (Calculated) : 73   Vital Signs: Temp: 98 F (36.7 C) (04/12 1300) Temp Source: Oral (04/12 1300) BP: 105/80 mmHg (04/12 1500) Pulse Rate: 85 (04/12 1500)  Lipid Panel     Component Value Date/Time   CHOL 969* 01/16/2016 2310   CHOL 357* 09/04/2014 0328   CHOL 1116* 04/27/2012 0939   TRIG 1850* 01/21/2016 0418   TRIG 1201* 11/04/2014 0427   HDL 21* 01/16/2016 2310   HDL SEE COMMENT 09/04/2014 0328   HDL 60 04/27/2012 0939   CHOLHDL NOT REPORTED DUE TO HIGH TRIGLYCERIDES 01/16/2016 2310   CHOLHDL 18.6* 04/27/2012 0939   VLDL UNABLE TO CALCULATE IF TRIGLYCERIDE OVER 400 mg/dL 01/16/2016 2310   VLDL SEE COMMENT 09/04/2014 0328   LDLCALC UNABLE TO CALCULATE IF TRIGLYCERIDE OVER 400 mg/dL 01/16/2016 2310   LDLCALC SEE COMMENT 09/04/2014 0328   LDLCALC Comment 04/27/2012 0939   LDLDIRECT 111.4 06/30/2012 0950   Medical History: Past Medical History  Diagnosis Date  . Recurrent acute pancreatitis     secondary to hypertriglyceridemia   . Gastric ulcer 2009  . Sleep apnea 1999    uses CPAP  . Chicken pox   . Diabetes mellitus   . HTN (hypertension)   . Familial hypertriglyceridemia     severe  . Morbid obesity ([place])     s/p bariatric sleeve surgery 01/2015  . Anxiety   . Depression     Assessment: 40 yo male with chronic pancreatitis secondary to familial hypertriglyceridemia.  Pharmacy was consulted for initiation of insulin drip for hypertriglyceridemia.Patient is also receiving D10/0.45%NS with 7mEq of potassium at 170mL/hr.    Goal of Therapy:  Discontinue drip when Triglyceridemia < 1000 Blood  Glucose 100-180.   Plan:  Continue insulin drip at 12 units/hr. Continue D10/0.45%NS with 43mEq of potassium at 157mL/hr.   Continue to monitor Potassium Q12hr.   Continue triglyceride checks 12hr.   Pharmacy will continue to monitor and adjust per consult.   [name]  01/21/2016,4:22 PM

## 2016-01-21 NOTE — Progress Notes (Signed)
[facility] [place] NAME: Sergio [name]    MR#:  [id number]  DATE OF BIRTH:  10/18/1975  SUBJECTIVE:  CHIEF COMPLAINT:   Chief Complaint  Patient presents with  . Abdominal Pain   - Much improved nausea, still has abdominal pain. Rates at a 5 now, mostly in LLQ and epigastric region. - remains on insulin drip and D10 drip - TG down to 1850 this morning - foley catheter still present.  REVIEW OF SYSTEMS:  Review of Systems  Constitutional: Negative for fever, chills and malaise/fatigue.  HENT: Negative for ear discharge, ear pain and nosebleeds.   Eyes: Negative for blurred vision, double vision and photophobia.  Respiratory: Negative for cough, shortness of breath and wheezing.   Cardiovascular: Negative for chest pain and palpitations.  Gastrointestinal: Positive for nausea and abdominal pain. Negative for vomiting, diarrhea and constipation.  Genitourinary: Negative for dysuria and urgency.       Urinary retention  Musculoskeletal: Negative for myalgias and neck pain.  Neurological: Negative for dizziness, sensory change, speech change, focal weakness, seizures and headaches.  Psychiatric/Behavioral: Negative for depression.    DRUG ALLERGIES:   Allergies  Allergen Reactions  . Codeine Anaphylaxis  . Ivp Dye [Iodinated Diagnostic Agents] Other (See Comments)    Kidneys stop working    VITALS:  Blood pressure 117/79, pulse 73, temperature 97.7 F (36.5 C), temperature source Oral, resp. rate 12, height 5\' 10"  (1.778 m), weight 112.8 kg (248 lb 10.9 oz), SpO2 96 %.  PHYSICAL EXAMINATION:  Physical Exam  GENERAL:  40 y.o.-year-old patient lying in the bed with minimal distress due to abdominal pain.  EYES: Pupils equal, round, reactive to light and accommodation. No scleral icterus. Extraocular muscles intact.  HEENT: Head atraumatic, normocephalic. Oropharynx and nasopharynx clear.  NECK:  Supple, no jugular venous  distention. No thyroid enlargement, no tenderness.  LUNGS: Normal breath sounds bilaterally, no wheezing, rales,rhonchi or crepitation. No use of accessory muscles of respiration.  CARDIOVASCULAR: S1, S2 normal. No murmurs, rubs, or gallops.  ABDOMEN: Soft, but improving tenderness to palpation in the epigastric and left upper quadrant areas, nondistended. Bowel sounds present. No organomegaly or mass.  EXTREMITIES: No  cyanosis, or clubbing. Trace pedal edema NEUROLOGIC: Cranial nerves II through XII are intact. Muscle strength 5/5 in all extremities. Sensation intact. Gait not checked.  PSYCHIATRIC: The patient is alert and oriented x 3.  SKIN: No obvious rash, lesion, or ulcer.    LABORATORY PANEL:   CBC  Recent Labs Lab 01/16/16 2310  WBC 7.1  HGB 12.5*  HCT 39.3*  PLT 257   ------------------------------------------------------------------------------------------------------------------  Chemistries   Recent Labs Lab 01/16/16 2310  01/20/16 0334 01/20/16 1755  NA 116*  < > 131*  --   K 3.0*  < > 4.5 3.9  CL 83*  < > 95*  --   CO2 23  < > 25  --   GLUCOSE 201*  < > 192*  --   BUN UNABLE TO REPORT DUE TO LIPEMIC INTERFERENCE  < > <5*  --   CREATININE UNABLE TO REPORT DUE TO LIPEMIC INTERFERENCE  < > 0.64  --   CALCIUM 8.6*  < > 9.0  --   MG  --   < > 1.9  --   AST UNABLE TO PERFORM DUE TO LIPEMIC INTERFERENCE  --   --   --   ALT UNABLE TO PERFORM DUE TO LIPEMIC INTERFERENCE  --   --   --  ALKPHOS 45  --   --   --   BILITOT <0.1*  --   --   --   < > = values in this interval not displayed. ------------------------------------------------------------------------------------------------------------------  Cardiac Enzymes No results for input(s): TROPONINI in the last 168 hours. ------------------------------------------------------------------------------------------------------------------  RADIOLOGY:  No results found.  EKG:   Orders placed or performed  during the hospital encounter of 01/17/16  . EKG 12-Lead  . EKG 12-Lead    ASSESSMENT AND PLAN:   40 year old male with past medical history significant for type 1 diabetes mellitus, hypertension, hypertriglyceridemia causing acute pancreatitis episodes presents to the hospital secondary to abdominal pain nausea and vomiting.  #1 Acute pancreatitis-related to hypertriglyceridemia again. -Keep nothing by mouth, supportive medications for nausea and abdominal pain. -IV fluids and get triglycerides controlled. -Once symptoms improve and triglycerides are down <1000, can start on clear liquids.  #2 Hypertriglyceridemia-unsure if has family history as patient is adopted.Likely familial. -Triglycerides greater than 5000 on admisison. on insulin drip. And D10 drip for sugars - Now improving down to 1850 this AM - continue insulin drip until TG <1000 -Restarted gemfibrozil and Crestor. Crestor dose increased. - Endocrinology consult appreciated  #3 hyponatremia-likely pseudohyponatremia secondary to hypertriglyceridemia on admission -. Also patient receiving D10 drip - Improving sodium now -continue to monitor  #4 hypokalemia-being replaced aggressively, now normalized.  -Continue to monitor if we need to back down on the KCl in the fluids  #5 obstructive sleep apnea-continue CPAP  #6 depression-continue home medications- on trazodone, Zoloft and Xanax  #7 DM- on insulin pump at home, currently on hold as on insulin drip - monitor and restart once off of drip. Appreciate endocrinology input.  #8 DVT prophylaxis-on Lovenox  Possible discharge in 2 days as TG improve and patient tolerating diet.   All the records are reviewed and case discussed with Care Management/Social Workerr. Management plans discussed with the patient, family and they are in agreement.  CODE STATUS: Full code  TOTAL TIME TAKING CARE OF THIS PATIENT: 33 minutes.   POSSIBLE D/C IN 2 DAYS, DEPENDING ON  CLINICAL CONDITION.   Gladstone Lighter M.D on 01/21/2016 at 7:58 AM  Between 7am to 6pm - Pager - [phone]  After 6pm go to [email or web address] - password [email or web address] [facility]  Office  [phone]  CC: Primary care physician; Crecencio Mc, MD

## 2016-01-21 NOTE — Progress Notes (Signed)
Endocrinology follow up  History of present illness Sergio [name] is a 40 y.o. male seen in follow up for acute pancreatitis due to severe hypertriglyceridemia. No acute events. He reports persistent nausea. No emesis in since 2 nights ago.  Appetite poor. He remains NPO. Abd pain now in the 5-7/10 range. Triglycerides slowly improving - level now 1850.  Sugars are in the 100 - 180 range. Generally clinically improving. He has his pump supplies with him but needs to double check to make sure he has everything he will need.    Medical history Past Medical History  Diagnosis Date  . Recurrent acute pancreatitis     secondary to hypertriglyceridemia   . Gastric ulcer 2009  . Sleep apnea 1999    uses CPAP  . Chicken pox   . Diabetes mellitus   . HTN (hypertension)   . Familial hypertriglyceridemia     severe  . Morbid obesity ([place])     s/p bariatric sleeve surgery 01/2015  . Anxiety   . Depression      Surgical history Past Surgical History  Procedure Laterality Date  . Tonsillectomy  2003  . Vasectomy    . Lumbar disc surgery  2008  . Cholecystectomy    . Laparoscopic gastric sleeve resection       Medications . docusate sodium  100 mg Oral BID  . enoxaparin (LOVENOX) injection  40 mg Subcutaneous Q24H  . gemfibrozil  600 mg Oral BID AC  . omega-3 acid ethyl esters  4 g Oral Daily  . pregabalin  300 mg Oral Daily  . rosuvastatin  20 mg Oral q1800  . sertraline  50 mg Oral Daily  . sodium chloride flush  3 mL Intravenous Q12H  . tamsulosin  0.4 mg Oral Daily  . traZODone  100 mg Oral QHS     Social history Social History  Substance Use Topics  . Smoking status: Never Smoker   . Smokeless tobacco: Never Used  . Alcohol Use: No     Comment: once month     Family history Family History  Problem Relation Age of Onset  . Adopted: Yes  . Hypertension Mother   . Hyperlipidemia Mother   . Hypertension Father   . Hyperlipidemia Father      Review of  systems CV: no chest pain or palpitations PULM: no cough or shortness of breath GU: +urinary retention with Foley catheter in pace  Physical Exam BP 114/75 mmHg  Pulse 78  Temp(Src) 98 F (36.7 C) (Oral)  Resp 9  Ht 5\' 10"  (1.778 m)  Wt 112.8 kg (248 lb 10.9 oz)  BMI 35.68 kg/m2  SpO2 96%  GEN: Obese white  male, in NAD. HEENT: No proptosis, EOMI, lid lag or stare. Oropharynx is clear.  NECK: supple, trachea midline.   RESPIRATORY: clear bilaterally, no wheeze, good inspiratory effort. CV: No carotid bruits, RRR. ABD: soft, + diffusely tender, decreased BS EXT: no peripheral edema SKIN: no dermatopathy or rash or acanthosis nigricans. No xanthomata. LYMPH: no submandibular or supraclavicular LAD PSYC: alert and oriented, good insight  Labs Results for orders placed or performed during the hospital encounter of 01/17/16 (from the past 24 hour(s))  Potassium     Status: None   Collection Time: 01/20/16  5:55 PM  Result Value Ref Range   Potassium 3.9 3.5 - 5.1 mmol/L  Triglycerides     Status: Abnormal   Collection Time: 01/20/16  5:55 PM  Result Value Ref Range  Triglycerides 2661 (H) <150 mg/dL  Glucose, capillary     Status: Abnormal   Collection Time: 01/20/16  7:33 PM  Result Value Ref Range   Glucose-Capillary 141 (H) 65 - 99 mg/dL  Glucose, capillary     Status: Abnormal   Collection Time: 01/20/16  8:33 PM  Result Value Ref Range   Glucose-Capillary 155 (H) 65 - 99 mg/dL  Glucose, capillary     Status: Abnormal   Collection Time: 01/20/16  9:46 PM  Result Value Ref Range   Glucose-Capillary 130 (H) 65 - 99 mg/dL  Glucose, capillary     Status: Abnormal   Collection Time: 01/20/16 10:44 PM  Result Value Ref Range   Glucose-Capillary 150 (H) 65 - 99 mg/dL  Glucose, capillary     Status: Abnormal   Collection Time: 01/20/16 11:28 PM  Result Value Ref Range   Glucose-Capillary 151 (H) 65 - 99 mg/dL  Glucose, capillary     Status: Abnormal   Collection Time:  01/21/16 12:45 AM  Result Value Ref Range   Glucose-Capillary 133 (H) 65 - 99 mg/dL  Glucose, capillary     Status: Abnormal   Collection Time: 01/21/16  1:28 AM  Result Value Ref Range   Glucose-Capillary 134 (H) 65 - 99 mg/dL  Glucose, capillary     Status: Abnormal   Collection Time: 01/21/16  2:29 AM  Result Value Ref Range   Glucose-Capillary 154 (H) 65 - 99 mg/dL  Glucose, capillary     Status: Abnormal   Collection Time: 01/21/16  3:26 AM  Result Value Ref Range   Glucose-Capillary 169 (H) 65 - 99 mg/dL  Basic metabolic panel     Status: Abnormal   Collection Time: 01/21/16  4:18 AM  Result Value Ref Range   Sodium 136 135 - 145 mmol/L   Potassium 3.9 3.5 - 5.1 mmol/L   Chloride 96 (L) 101 - 111 mmol/L   CO2 28 22 - 32 mmol/L   Glucose, Bld 158 (H) 65 - 99 mg/dL   BUN <5 (L) 6 - 20 mg/dL   Creatinine, Ser 0.78 0.61 - 1.24 mg/dL   Calcium 8.7 (L) 8.9 - 10.3 mg/dL   GFR calc non Af Amer >60 >60 mL/min   GFR calc Af Amer >60 >60 mL/min   Anion gap 12 5 - 15  Magnesium     Status: None   Collection Time: 01/21/16  4:18 AM  Result Value Ref Range   Magnesium 2.0 1.7 - 2.4 mg/dL  Triglycerides     Status: Abnormal   Collection Time: 01/21/16  4:18 AM  Result Value Ref Range   Triglycerides 1850 (H) <150 mg/dL  Glucose, capillary     Status: Abnormal   Collection Time: 01/21/16  7:23 AM  Result Value Ref Range   Glucose-Capillary 147 (H) 65 - 99 mg/dL  Glucose, capillary     Status: Abnormal   Collection Time: 01/21/16  8:28 AM  Result Value Ref Range   Glucose-Capillary 140 (H) 65 - 99 mg/dL  Glucose, capillary     Status: Abnormal   Collection Time: 01/21/16  9:30 AM  Result Value Ref Range   Glucose-Capillary 126 (H) 65 - 99 mg/dL  Glucose, capillary     Status: Abnormal   Collection Time: 01/21/16 10:26 AM  Result Value Ref Range   Glucose-Capillary 157 (H) 65 - 99 mg/dL  Glucose, capillary     Status: Abnormal   Collection Time: 01/21/16 11:51 AM  Result  Value Ref Range   Glucose-Capillary 132 (H) 65 - 99 mg/dL  Glucose, capillary     Status: Abnormal   Collection Time: 01/21/16 12:40 PM  Result Value Ref Range   Glucose-Capillary 148 (H) 65 - 99 mg/dL  Glucose, capillary     Status: Abnormal   Collection Time: 01/21/16  2:25 PM  Result Value Ref Range   Glucose-Capillary 146 (H) 65 - 99 mg/dL  Glucose, capillary     Status: Abnormal   Collection Time: 01/21/16  4:23 PM  Result Value Ref Range   Glucose-Capillary 120 (H) 65 - 99 mg/dL    Lab Results  Component Value Date   HGBA1C 9.7* 01/16/2016     Assessment 1.Acute pancreatitis due to hypertriglyceridemia. 2. Familial hypertriglyceridemia 3. Type 2 diabetes with hyperglycemia, uncontrolled 4. Type 2 diabetes with peripheral neuropathy 5. Obesity (BMI 36)2  Plan 1. Clinically improved. Continue use of IV insulin and IV dextrose. Aim to keep sugars in the 100-180 range.  2. Keep NPO until off the IV insulin. 3. Trend triglycerides. Continue IV insulin until serum Tg level <1000. 4. Continue fibrate + fish oil + statin. 5. Patient has his pump supplies. He will double check to make sure he has everything he needs. Tentative plan is to transition stright from IV insulin to insulin pump, once Tg <100. There does not need to be an overlap in the insulins. The pump can be placed and started and then IV insulin immediately stopped. At that point, sugars should be checked either q4 hrs while awake and 1-2 times overnight while NPO and the qACHS + at 2 AM once given a diet. 6. Once diet is ordered - please be sure that diet orders in the comments it is writte "NO JUICE, NO CONCENTRATED SWEETS".  I will follow along with you.

## 2016-01-21 NOTE — Progress Notes (Signed)
MEDICATION RELATED CONSULT NOTE -Follow up  Pharmacy Consult for Insulin drip for hypertriglyceridemia; electrolyte management.    Allergies  Allergen Reactions  . Codeine Anaphylaxis  . Ivp Dye [Iodinated Diagnostic Agents] Other (See Comments)    Kidneys stop working    Patient Measurements: Height: 5\' 10"  (177.8 cm) Weight: 248 lb 10.9 oz (112.8 kg) IBW/kg (Calculated) : 73   Vital Signs: Temp: 98.8 F (37.1 C) (04/12 1738) Temp Source: Oral (04/12 1738) BP: 131/82 mmHg (04/12 1740) Pulse Rate: 89 (04/12 1746)  Lipid Panel     Component Value Date/Time   CHOL 969* 01/16/2016 2310   CHOL 357* 09/04/2014 0328   CHOL 1116* 04/27/2012 0939   TRIG 1850* 01/21/2016 0418   TRIG 1201* 11/04/2014 0427   HDL 21* 01/16/2016 2310   HDL SEE COMMENT 09/04/2014 0328   HDL 60 04/27/2012 0939   CHOLHDL NOT REPORTED DUE TO HIGH TRIGLYCERIDES 01/16/2016 2310   CHOLHDL 18.6* 04/27/2012 0939   VLDL UNABLE TO CALCULATE IF TRIGLYCERIDE OVER 400 mg/dL 01/16/2016 2310   VLDL SEE COMMENT 09/04/2014 0328   LDLCALC UNABLE TO CALCULATE IF TRIGLYCERIDE OVER 400 mg/dL 01/16/2016 2310   LDLCALC SEE COMMENT 09/04/2014 0328   LDLCALC Comment 04/27/2012 0939   LDLDIRECT 111.4 06/30/2012 0950   Medical History: Past Medical History  Diagnosis Date  . Recurrent acute pancreatitis     secondary to hypertriglyceridemia   . Gastric ulcer 2009  . Sleep apnea 1999    uses CPAP  . Chicken pox   . Diabetes mellitus   . HTN (hypertension)   . Familial hypertriglyceridemia     severe  . Morbid obesity ([place])     s/p bariatric sleeve surgery 01/2015  . Anxiety   . Depression     Assessment: 40 yo male with chronic pancreatitis secondary to familial hypertriglyceridemia.  Pharmacy was consulted for initiation of insulin drip for hypertriglyceridemia.Patient is also receiving D10/0.45%NS with 51mEq of potassium at 155mL/hr.    Goal of Therapy:  Discontinue drip when Triglyceridemia <  1000 Blood Glucose 100-180.   Plan:  Continue insulin drip at 12 units/hr. Continue D10/0.45%NS with 36mEq of potassium at 197mL/hr.   Continue to monitor Potassium Q12hr. Potassium @ 1647 this evening is within normal limits at 4.3. No additional supplementation needed  Continue triglyceride checks 12hr.   Pharmacy will continue to monitor and adjust per consult.   Lenis Noon, PharmD Clinical Pharmacist  01/21/2016,6:21 PM

## 2016-01-22 ENCOUNTER — Encounter: Payer: Self-pay | Admitting: Internal Medicine

## 2016-01-22 LAB — GLUCOSE, CAPILLARY
Glucose-Capillary: 102 mg/dL — ABNORMAL HIGH (ref 65–99)
Glucose-Capillary: 105 mg/dL — ABNORMAL HIGH (ref 65–99)
Glucose-Capillary: 106 mg/dL — ABNORMAL HIGH (ref 65–99)
Glucose-Capillary: 107 mg/dL — ABNORMAL HIGH (ref 65–99)
Glucose-Capillary: 109 mg/dL — ABNORMAL HIGH (ref 65–99)
Glucose-Capillary: 110 mg/dL — ABNORMAL HIGH (ref 65–99)
Glucose-Capillary: 112 mg/dL — ABNORMAL HIGH (ref 65–99)
Glucose-Capillary: 112 mg/dL — ABNORMAL HIGH (ref 65–99)
Glucose-Capillary: 82 mg/dL (ref 65–99)
Glucose-Capillary: 89 mg/dL (ref 65–99)
Glucose-Capillary: 91 mg/dL (ref 65–99)
Glucose-Capillary: 92 mg/dL (ref 65–99)
Glucose-Capillary: 93 mg/dL (ref 65–99)
Glucose-Capillary: 94 mg/dL (ref 65–99)
Glucose-Capillary: 96 mg/dL (ref 65–99)
Glucose-Capillary: 97 mg/dL (ref 65–99)
Glucose-Capillary: 97 mg/dL (ref 65–99)
Glucose-Capillary: 99 mg/dL (ref 65–99)
Glucose-Capillary: 99 mg/dL (ref 65–99)

## 2016-01-22 LAB — BASIC METABOLIC PANEL
Anion gap: 6 (ref 5–15)
BUN: <5 mg/dL — ABNORMAL LOW (ref 6–20)
CALCIUM: 8.4 mg/dL — AB (ref 8.9–10.3)
CHLORIDE: 99 mmol/L — AB (ref 101–111)
CO2: 28 mmol/L (ref 22–32)
CREATININE: 0.79 mg/dL (ref 0.61–1.24)
GFR calc Af Amer: >60 mL/min (ref >60)
GFR calc non Af Amer: >60 mL/min (ref >60)
GLUCOSE: 114 mg/dL — AB (ref 65–99)
Potassium: 3.7 mmol/L (ref 3.5–5.1)
Sodium: 133 mmol/L — ABNORMAL LOW (ref 135–145)

## 2016-01-22 LAB — TRIGLYCERIDES: Triglycerides: 1525 mg/dL — ABNORMAL HIGH (ref <150)

## 2016-01-22 LAB — POTASSIUM: Potassium: 3.5 mmol/L (ref 3.5–5.1)

## 2016-01-22 NOTE — Progress Notes (Signed)
[facility] [place] NAME: Myra Giacone    MRN#:  [id number]  DATE OF BIRTH:  April 26, 1976  SUBJECTIVE:  Hospital Day: 5 days Zakkery Melhorn is a 40 y.o. male presenting with Abdominal Pain .   Overnight events: No overnight events Interval Events: Remains on insulin drip states abdominal pain has improved 5/10 intensity  REVIEW OF SYSTEMS:  CONSTITUTIONAL: No fever, fatigue or weakness.  EYES: No blurred or double vision.  EARS, NOSE, AND THROAT: No tinnitus or ear pain.  RESPIRATORY: No cough, shortness of breath, wheezing or hemoptysis.  CARDIOVASCULAR: No chest pain, orthopnea, edema.  GASTROINTESTINAL: No nausea, vomiting, diarrhea positive abdominal pain.  GENITOURINARY: No dysuria, hematuria.  ENDOCRINE: No polyuria, nocturia,  HEMATOLOGY: No anemia, easy bruising or bleeding SKIN: No rash or lesion. MUSCULOSKELETAL: No joint pain or arthritis.   NEUROLOGIC: No tingling, numbness, weakness.  PSYCHIATRY: No anxiety or depression.   DRUG ALLERGIES:   Allergies  Allergen Reactions  . Codeine Anaphylaxis  . Ivp Dye [Iodinated Diagnostic Agents] Other (See Comments)    Kidneys stop working    VITALS:  Blood pressure 125/91, pulse 83, temperature 98.2 F (36.8 C), temperature source Oral, resp. rate 13, height 5\' 10"  (1.778 m), weight 114.3 kg (251 lb 15.8 oz), SpO2 93 %.  PHYSICAL EXAMINATION:  VITAL SIGNS: Filed Vitals:   01/22/16 1100 01/22/16 1200  BP: 124/94 125/91  Pulse: 76 83  Temp:    Resp: 13 13   GENERAL:40 y.o.male currently in no acute distress.  HEAD: Normocephalic, atraumatic.  EYES: Pupils equal, round, reactive to light. Extraocular muscles intact. No scleral icterus.  MOUTH: Moist mucosal membrane. Dentition intact. No abscess noted.  EAR, NOSE, THROAT: Clear without exudates. No external lesions.  NECK: Supple. No thyromegaly. No nodules. No JVD.  PULMONARY: Clear to ascultation, without wheeze  rails or rhonci. No use of accessory muscles, Good respiratory effort. good air entry bilaterally CHEST: Nontender to palpation.  CARDIOVASCULAR: S1 and S2. Regular rate and rhythm. No murmurs, rubs, or gallops. No edema. Pedal pulses 2+ bilaterally.  GASTROINTESTINAL: Soft, nontender, nondistended. No masses. Positive bowel sounds. No hepatosplenomegaly.  MUSCULOSKELETAL: No swelling, clubbing, or edema. Range of motion full in all extremities.  NEUROLOGIC: Cranial nerves II through XII are intact. No gross focal neurological deficits. Sensation intact. Reflexes intact.  SKIN: No ulceration, lesions, rashes, or cyanosis. Skin warm and dry. Turgor intact.  PSYCHIATRIC: Mood, affect within normal limits. The patient is awake, alert and oriented x 3. Insight, judgment intact.      LABORATORY PANEL:   CBC  Recent Labs Lab 01/16/16 2310  WBC 7.1  HGB 12.5*  HCT 39.3*  PLT 257   ------------------------------------------------------------------------------------------------------------------  Chemistries   Recent Labs Lab 01/16/16 2310  01/21/16 0418  01/22/16 0402  NA 116*  < > 136  --  133*  K 3.0*  < > 3.9  < > 3.7  CL 83*  < > 96*  --  99*  CO2 23  < > 28  --  28  GLUCOSE 201*  < > 158*  --  114*  BUN UNABLE TO REPORT DUE TO LIPEMIC INTERFERENCE  < > <5*  --  <5*  CREATININE UNABLE TO REPORT DUE TO LIPEMIC INTERFERENCE  < > 0.78  --  0.79  CALCIUM 8.6*  < > 8.7*  --  8.4*  MG  --   < > 2.0  --   --  AST UNABLE TO PERFORM DUE TO LIPEMIC INTERFERENCE  --   --   --   --   ALT UNABLE TO PERFORM DUE TO LIPEMIC INTERFERENCE  --   --   --   --   ALKPHOS 45  --   --   --   --   BILITOT <0.1*  --   --   --   --   < > = values in this interval not displayed. ------------------------------------------------------------------------------------------------------------------  Cardiac Enzymes No results for input(s): TROPONINI in the last 168  hours. ------------------------------------------------------------------------------------------------------------------  RADIOLOGY:  No results found.  EKG:   Orders placed or performed during the hospital encounter of 01/17/16  . EKG 12-Lead  . EKG 12-Lead    ASSESSMENT AND PLAN:   Antonios Tintle is a 40 y.o. male presenting with Abdominal Pain . Admitted 01/17/2016 : Day #: 5 days   1. Hypertriglyceridemia induced pancreatitis -Continue insulin drip 0.1 unit per kilo per hour adjusting IV fluids to maintain blood glucose Follow triglycerides daily -Once symptoms improve and triglycerides are down <1000, can start on clear liquids. -Restarted gemfibrozil and Crestor. Crestor dose increased. - Endocrinology consult appreciated  2 hypokalemia-being replaced aggressively, now normalized.  -Continue to monitor if we need to back down on the KCl in the fluids  3 obstructive sleep apnea-continue CPAP  4 depression-continue home medications- on trazodone, Zoloft and Xanax  5 DM- on insulin pump at home, currently on hold as on insulin drip - monitor and restart once off of drip. Appreciate endocrinology input.  6 DVT prophylaxis-on Lovenox   All the records are reviewed and case discussed with Care Management/Social Workerr. Management plans discussed with the patient, family and they are in agreement.  CODE STATUS: full TOTAL TIME TAKING CARE OF THIS PATIENT: 28 minutes.   POSSIBLE D/C IN 2-3DAYS, DEPENDING ON CLINICAL CONDITION.   Hower,  [name].D on 01/22/2016 at 12:23 PM  Between 7am to 6pm - Pager - [phone]  After 6pm: House Pager: - [facility]  Office  [phone]  CC: Primary care physician; Crecencio Mc, MD

## 2016-01-22 NOTE — Progress Notes (Signed)
MEDICATION RELATED CONSULT NOTE -Follow up  Pharmacy Consult for Insulin drip for hypertriglyceridemia; electrolyte management.    Allergies  Allergen Reactions  . Codeine Anaphylaxis  . Ivp Dye [Iodinated Diagnostic Agents] Other (See Comments)    Kidneys stop working    Patient Measurements: Height: 5\' 10"  (177.8 cm) Weight: 251 lb 15.8 oz (114.3 kg) IBW/kg (Calculated) : 73   Vital Signs: Temp: 98 F (36.7 C) (04/13 1200) Temp Source: Oral (04/13 1200) BP: 117/92 mmHg (04/13 1800) Pulse Rate: 75 (04/13 1800)  Lipid Panel     Component Value Date/Time   CHOL 969* 01/16/2016 2310   CHOL 357* 09/04/2014 0328   CHOL 1116* 04/27/2012 0939   TRIG 1525* 01/22/2016 0745   TRIG 1201* 11/04/2014 0427   HDL 21* 01/16/2016 2310   HDL SEE COMMENT 09/04/2014 0328   HDL 60 04/27/2012 0939   CHOLHDL NOT REPORTED DUE TO HIGH TRIGLYCERIDES 01/16/2016 2310   CHOLHDL 18.6* 04/27/2012 0939   VLDL UNABLE TO CALCULATE IF TRIGLYCERIDE OVER 400 mg/dL 01/16/2016 2310   VLDL SEE COMMENT 09/04/2014 0328   LDLCALC UNABLE TO CALCULATE IF TRIGLYCERIDE OVER 400 mg/dL 01/16/2016 2310   LDLCALC SEE COMMENT 09/04/2014 0328   LDLCALC Comment 04/27/2012 0939   LDLDIRECT 111.4 06/30/2012 0950   Medical History: Past Medical History  Diagnosis Date  . Recurrent acute pancreatitis     secondary to hypertriglyceridemia   . Gastric ulcer 2009  . Sleep apnea 1999    uses CPAP  . Chicken pox   . Diabetes mellitus   . HTN (hypertension)   . Familial hypertriglyceridemia     severe  . Morbid obesity ([place])     s/p bariatric sleeve surgery 01/2015  . Anxiety   . Depression     Assessment: 40 yo male with chronic pancreatitis secondary to familial hypertriglyceridemia.  Pharmacy was consulted for initiation of insulin drip for hypertriglyceridemia.Patient is also receiving D10/0.45%NS with 90mEq of potassium at 125 mL/hr.    Goal of Therapy:  Discontinue drip when Triglyceridemia < 1000  (transition to insulin pump per Endocrine) Blood Glucose 100-180.   Plan:  Continue insulin drip at 12 units/hr. Continue D10/0.45%NS with 17mEq of potassium at 125 mL/hr.   Continue to monitor Potassium Q12hr. Will f/u with next level at 1700.  4/13:  K @ 17:00 = 3.5.   Will continue with D101/2NS with 40 mEq KCL and recheck K on 4/14 @ 5:00.   Next TG in AM.   Pharmacy will continue to monitor and adjust per consult.   Hayven Fatima D  01/22/2016,6:10 PM

## 2016-01-22 NOTE — Care Management (Signed)
Barriers- remains on insulin drip.  Triglycerides 1525 this day which is up from 1424 on 4/12.

## 2016-01-22 NOTE — Progress Notes (Signed)
MEDICATION RELATED CONSULT NOTE -Follow up  Pharmacy Consult for Insulin drip for hypertriglyceridemia; electrolyte management.    Allergies  Allergen Reactions  . Codeine Anaphylaxis  . Ivp Dye [Iodinated Diagnostic Agents] Other (See Comments)    Kidneys stop working    Patient Measurements: Height: 5\' 10"  (177.8 cm) Weight: 251 lb 15.8 oz (114.3 kg) IBW/kg (Calculated) : 73   Vital Signs: Temp: 98.2 F (36.8 C) (04/13 0800) Temp Source: Oral (04/13 0800) BP: 125/86 mmHg (04/13 0900) Pulse Rate: 72 (04/13 0900)  Lipid Panel     Component Value Date/Time   CHOL 969* 01/16/2016 2310   CHOL 357* 09/04/2014 0328   CHOL 1116* 04/27/2012 0939   TRIG 1525* 01/22/2016 0745   TRIG 1201* 11/04/2014 0427   HDL 21* 01/16/2016 2310   HDL SEE COMMENT 09/04/2014 0328   HDL 60 04/27/2012 0939   CHOLHDL NOT REPORTED DUE TO HIGH TRIGLYCERIDES 01/16/2016 2310   CHOLHDL 18.6* 04/27/2012 0939   VLDL UNABLE TO CALCULATE IF TRIGLYCERIDE OVER 400 mg/dL 01/16/2016 2310   VLDL SEE COMMENT 09/04/2014 0328   LDLCALC UNABLE TO CALCULATE IF TRIGLYCERIDE OVER 400 mg/dL 01/16/2016 2310   LDLCALC SEE COMMENT 09/04/2014 0328   LDLCALC Comment 04/27/2012 0939   LDLDIRECT 111.4 06/30/2012 0950   Medical History: Past Medical History  Diagnosis Date  . Recurrent acute pancreatitis     secondary to hypertriglyceridemia   . Gastric ulcer 2009  . Sleep apnea 1999    uses CPAP  . Chicken pox   . Diabetes mellitus   . HTN (hypertension)   . Familial hypertriglyceridemia     severe  . Morbid obesity ([place])     s/p bariatric sleeve surgery 01/2015  . Anxiety   . Depression     Assessment: 40 yo male with chronic pancreatitis secondary to familial hypertriglyceridemia.  Pharmacy was consulted for initiation of insulin drip for hypertriglyceridemia.Patient is also receiving D10/0.45%NS with 12mEq of potassium at 125 mL/hr.    Goal of Therapy:  Discontinue drip when Triglyceridemia < 1000  (transition to insulin pump per Endocrine) Blood Glucose 100-180.   Plan:  Continue insulin drip at 12 units/hr. Continue D10/0.45%NS with 24mEq of potassium at 125 mL/hr.   Continue to monitor Potassium Q12hr. Will f/u with next level at 1700.  Next TG in AM.   Pharmacy will continue to monitor and adjust per consult.   Edmon, Evilsizer D  01/22/2016,11:16 AM

## 2016-01-22 NOTE — Plan of Care (Signed)
Problem: Safety: Goal: Ability to remain free from injury will improve Outcome: Progressing No injury  Problem: Health Behavior/Discharge Planning: Goal: Ability to manage health-related needs will improve Outcome: Progressing Plans home at discharge. Has all insulin pump supplies in room to mange blood sugars when ready to transition  Problem: Pain Managment: Goal: General experience of comfort will improve Outcome: Progressing Dilaudid 2mg  iv q 3-4 hr prn abdominal pain. Brings pain to level "2" or sleeping  Problem: Physical Regulation: Goal: Ability to maintain clinical measurements within normal limits will improve Outcome: Progressing Blood sugars maintaied 97- 104 on D10%1/2NS with 7meq KCL at 134ml/hr. Insulin gtt continues at 12u/hr. Last triglycerides 1525 Goal: Will remain free from infection Outcome: Progressing Afebrile. WBC 7.1. Lungs clear and decreased bases.Urine clear yellow  Problem: Skin Integrity: Goal: Risk for impaired skin integrity will decrease Outcome: Progressing Redness from wearing his home cpap mask. Redness is below nasal septum and on bridge of nose. Gel cushions provided  Problem: Activity: Goal: Risk for activity intolerance will decrease Outcome: Progressing Bedrest today due to abdominal pain  Problem: Fluid Volume: Goal: Ability to maintain a balanced intake and output will improve Outcome: Progressing Good uop  Problem: Nutrition: Goal: Adequate nutrition will be maintained Outcome: Not Progressing Remains NPO  Problem: Bowel/Gastric: Goal: Will not experience complications related to bowel motility Outcome: Progressing Bowel sounds present .  Abdomen tender but soft. Refused stool softener. Stated stool yesterday.

## 2016-01-22 NOTE — Progress Notes (Signed)
Endocrinology follow up  History of present illness Sergio [name] is a 40 y.o. male seen in follow up for acute pancreatitis due to severe hypertriglyceridemia. No acute events. He reports persistent nausea. No emesis in since 2 nights ago.  Appetite poor. He remains NPO. Abd pain now in the 5-7/10 range. Triglycerides slowly improving - level now 1850.  Sugars are in the 100 - 180 range. Generally clinically improving. He has his pump supplies with him but needs to double check to make sure he has everything he will need.    Medical history Past Medical History  Diagnosis Date  . Recurrent acute pancreatitis     secondary to hypertriglyceridemia   . Gastric ulcer 2009  . Sleep apnea 1999    uses CPAP  . Chicken pox   . Diabetes mellitus   . HTN (hypertension)   . Familial hypertriglyceridemia     severe  . Morbid obesity ([place])     s/p bariatric sleeve surgery 01/2015  . Anxiety   . Depression      Surgical history Past Surgical History  Procedure Laterality Date  . Tonsillectomy  2003  . Vasectomy    . Lumbar disc surgery  2008  . Cholecystectomy    . Laparoscopic gastric sleeve resection       Medications . docusate sodium  100 mg Oral BID  . enoxaparin (LOVENOX) injection  40 mg Subcutaneous Q24H  . gemfibrozil  600 mg Oral BID AC  . omega-3 acid ethyl esters  4 g Oral Daily  . pregabalin  300 mg Oral Daily  . rosuvastatin  20 mg Oral q1800  . sertraline  50 mg Oral Daily  . sodium chloride flush  3 mL Intravenous Q12H  . tamsulosin  0.4 mg Oral Daily  . traZODone  100 mg Oral QHS     Social history Social History  Substance Use Topics  . Smoking status: Never Smoker   . Smokeless tobacco: Never Used  . Alcohol Use: No     Comment: once month     Family history Family History  Problem Relation Age of Onset  . Adopted: Yes  . Hypertension Mother   . Hyperlipidemia Mother   . Hypertension Father   . Hyperlipidemia Father      Review of  systems CV: no chest pain or palpitations PULM: no cough or shortness of breath GU: +urinary retention with Foley catheter in pace  Physical Exam BP 114/82 mmHg  Pulse 78  Temp(Src) 98 F (36.7 C) (Oral)  Resp 9  Ht 5\' 10"  (1.778 m)  Wt 114.3 kg (251 lb 15.8 oz)  BMI 36.16 kg/m2  SpO2 95%  GEN: Obese white  male, in NAD. HEENT: No proptosis, EOMI, lid lag or stare. Oropharynx is clear.  NECK: supple, trachea midline.   RESPIRATORY: clear bilaterally, no wheeze, good inspiratory effort. CV: No carotid bruits, RRR. ABD: soft, + diffusely tender, decreased BS EXT: no peripheral edema SKIN: no dermatopathy or rash or acanthosis nigricans. No xanthomata. LYMPH: no submandibular or supraclavicular LAD PSYC: alert and oriented, good insight  Labs Results for orders placed or performed during the hospital encounter of 01/17/16 (from the past 24 hour(s))  Glucose, capillary     Status: Abnormal   Collection Time: 01/21/16  4:23 PM  Result Value Ref Range   Glucose-Capillary 120 (H) 65 - 99 mg/dL  Potassium     Status: None   Collection Time: 01/21/16  4:47 PM  Result Value  Ref Range   Potassium 4.3 3.5 - 5.1 mmol/L  Glucose, capillary     Status: Abnormal   Collection Time: 01/21/16  5:44 PM  Result Value Ref Range   Glucose-Capillary 111 (H) 65 - 99 mg/dL  Glucose, capillary     Status: Abnormal   Collection Time: 01/21/16  6:34 PM  Result Value Ref Range   Glucose-Capillary 121 (H) 65 - 99 mg/dL  Glucose, capillary     Status: Abnormal   Collection Time: 01/21/16  7:30 PM  Result Value Ref Range   Glucose-Capillary 114 (H) 65 - 99 mg/dL  Triglycerides     Status: Abnormal   Collection Time: 01/21/16  8:42 PM  Result Value Ref Range   Triglycerides 1424 (H) <150 mg/dL  Glucose, capillary     Status: Abnormal   Collection Time: 01/21/16  8:56 PM  Result Value Ref Range   Glucose-Capillary 111 (H) 65 - 99 mg/dL  Glucose, capillary     Status: Abnormal   Collection Time:  01/21/16 10:11 PM  Result Value Ref Range   Glucose-Capillary 108 (H) 65 - 99 mg/dL  Glucose, capillary     Status: Abnormal   Collection Time: 01/21/16 11:04 PM  Result Value Ref Range   Glucose-Capillary 115 (H) 65 - 99 mg/dL  Glucose, capillary     Status: Abnormal   Collection Time: 01/22/16 12:10 AM  Result Value Ref Range   Glucose-Capillary 109 (H) 65 - 99 mg/dL  Glucose, capillary     Status: Abnormal   Collection Time: 01/22/16  1:13 AM  Result Value Ref Range   Glucose-Capillary 107 (H) 65 - 99 mg/dL  Glucose, capillary     Status: Abnormal   Collection Time: 01/22/16  2:10 AM  Result Value Ref Range   Glucose-Capillary 106 (H) 65 - 99 mg/dL  Glucose, capillary     Status: Abnormal   Collection Time: 01/22/16  3:04 AM  Result Value Ref Range   Glucose-Capillary 112 (H) 65 - 99 mg/dL  Basic metabolic panel     Status: Abnormal   Collection Time: 01/22/16  4:02 AM  Result Value Ref Range   Sodium 133 (L) 135 - 145 mmol/L   Potassium 3.7 3.5 - 5.1 mmol/L   Chloride 99 (L) 101 - 111 mmol/L   CO2 28 22 - 32 mmol/L   Glucose, Bld 114 (H) 65 - 99 mg/dL   BUN <5 (L) 6 - 20 mg/dL   Creatinine, Ser 0.79 0.61 - 1.24 mg/dL   Calcium 8.4 (L) 8.9 - 10.3 mg/dL   GFR calc non Af Amer >60 >60 mL/min   GFR calc Af Amer >60 >60 mL/min   Anion gap 6 5 - 15  Glucose, capillary     Status: Abnormal   Collection Time: 01/22/16  4:59 AM  Result Value Ref Range   Glucose-Capillary 110 (H) 65 - 99 mg/dL  Glucose, capillary     Status: None   Collection Time: 01/22/16  6:19 AM  Result Value Ref Range   Glucose-Capillary 99 65 - 99 mg/dL  Glucose, capillary     Status: None   Collection Time: 01/22/16  7:01 AM  Result Value Ref Range   Glucose-Capillary 96 65 - 99 mg/dL  Triglycerides     Status: Abnormal   Collection Time: 01/22/16  7:45 AM  Result Value Ref Range   Triglycerides 1525 (H) <150 mg/dL  Glucose, capillary     Status: Abnormal   Collection Time: 01/22/16  8:09 AM   Result Value Ref Range   Glucose-Capillary 112 (H) 65 - 99 mg/dL   Comment 1 Notify RN   Glucose, capillary     Status: None   Collection Time: 01/22/16  9:46 AM  Result Value Ref Range   Glucose-Capillary 99 65 - 99 mg/dL  Glucose, capillary     Status: None   Collection Time: 01/22/16 11:03 AM  Result Value Ref Range   Glucose-Capillary 97 65 - 99 mg/dL  Glucose, capillary     Status: None   Collection Time: 01/22/16 11:56 AM  Result Value Ref Range   Glucose-Capillary 82 65 - 99 mg/dL  Glucose, capillary     Status: None   Collection Time: 01/22/16 12:58 PM  Result Value Ref Range   Glucose-Capillary 94 65 - 99 mg/dL  Glucose, capillary     Status: None   Collection Time: 01/22/16  2:34 PM  Result Value Ref Range   Glucose-Capillary 97 65 - 99 mg/dL    Lab Results  Component Value Date   HGBA1C 9.7* 01/16/2016     Assessment 1.Acute pancreatitis due to hypertriglyceridemia. 2. Familial hypertriglyceridemia 3. Type 2 diabetes with hyperglycemia, uncontrolled 4. Type 2 diabetes with peripheral neuropathy 5. Obesity (BMI 36)  Plan 1. Clinically improved. Continue use of IV insulin and IV dextrose. Adjust IV fluids to maintain the insulin at 12 units/hr with sugars  in the 80-180 range. Do not reduce insulin rate, if possible.  2. Keep NPO until off the IV insulin. 3. Trend triglycerides. Continue IV insulin until serum Tg level <1000. 4. Continue fibrate + fish oil + statin. 5. Patient has his pump supplies. Plan is to transition stright from IV insulin to insulin pump, once Tg <100. There does not need to be an overlap in the insulins. The pump can be placed and started and then IV insulin immediately stopped. At that point, sugars should be checked either q4 hrs while awake and 1-2 times overnight while NPO and the qACHS + at 2 AM once given a diet. Pump settings: Basal rates 12 am 2.85 units/hr 8 am 2.95 units/hr 12 pm 2.6 units/hr 24-hr basal = 65.8  units  Bolus settings I:C ratio 12 am 3, 3 pm 2 Sensitivity 12 am 19, 4 pm 17 Target 100-110  6. Once diet is ordered - please be sure that diet orders in the comments it is writte "NO JUICE, NO CONCENTRATED SWEETS". Please also indicate that wife can bring in [place] to share with patient, as this has worked well in the past.   I am not available to round over the long weekend. I am available by page if needed at [phone]. I will see patient on 01/26/16 if he remains hospitalized. If discharged, then schedule follow up with me 1-2 weeks after discharge.

## 2016-01-23 LAB — GLUCOSE, CAPILLARY
Glucose-Capillary: 102 mg/dL — ABNORMAL HIGH (ref 65–99)
Glucose-Capillary: 103 mg/dL — ABNORMAL HIGH (ref 65–99)
Glucose-Capillary: 104 mg/dL — ABNORMAL HIGH (ref 65–99)
Glucose-Capillary: 115 mg/dL — ABNORMAL HIGH (ref 65–99)
Glucose-Capillary: 115 mg/dL — ABNORMAL HIGH (ref 65–99)
Glucose-Capillary: 73 mg/dL (ref 65–99)
Glucose-Capillary: 74 mg/dL (ref 65–99)
Glucose-Capillary: 76 mg/dL (ref 65–99)
Glucose-Capillary: 77 mg/dL (ref 65–99)
Glucose-Capillary: 82 mg/dL (ref 65–99)
Glucose-Capillary: 82 mg/dL (ref 65–99)
Glucose-Capillary: 89 mg/dL (ref 65–99)
Glucose-Capillary: 91 mg/dL (ref 65–99)
Glucose-Capillary: 94 mg/dL (ref 65–99)
Glucose-Capillary: 96 mg/dL (ref 65–99)

## 2016-01-23 LAB — CBC
HEMATOCRIT: 36.7 % — AB (ref 40.0–52.0)
HEMOGLOBIN: 12.5 g/dL — AB (ref 13.0–18.0)
MCH: 27.4 pg (ref 26.0–34.0)
MCHC: 34 g/dL (ref 32.0–36.0)
MCV: 80.5 fL (ref 80.0–100.0)
Platelets: 172 10*3/uL (ref 150–440)
RBC: 4.56 MIL/uL (ref 4.40–5.90)
RDW: 15.8 % — AB (ref 11.5–14.5)
WBC: 4 10*3/uL (ref 3.8–10.6)

## 2016-01-23 LAB — TRIGLYCERIDES: TRIGLYCERIDES: 1121 mg/dL — AB (ref <150)

## 2016-01-23 LAB — POTASSIUM
POTASSIUM: 3.7 mmol/L (ref 3.5–5.1)
Potassium: 3.6 mmol/L (ref 3.5–5.1)

## 2016-01-23 MED ORDER — ALPRAZOLAM 0.5 MG PO TABS
0.5000 mg | ORAL_TABLET | Freq: Four times a day (QID) | ORAL | Status: DC | PRN
  Administered 2016-01-23 – 2016-01-25 (×4): 0.5 mg via ORAL
  Filled 2016-01-23 (×4): qty 1

## 2016-01-23 NOTE — Progress Notes (Signed)
Patient resting comfortably overnight, initial complaints of abdominal pain relieved with IV dilauded. IV insulin continues at 12units/hr.  Patients VSS see [facility] for further assessment.

## 2016-01-23 NOTE — Progress Notes (Signed)
MEDICATION RELATED CONSULT NOTE -Follow up  Pharmacy Consult for Insulin drip for hypertriglyceridemia; electrolyte management.    Allergies  Allergen Reactions  . Codeine Anaphylaxis  . Ivp Dye [Iodinated Diagnostic Agents] Other (See Comments)    Kidneys stop working    Patient Measurements: Height: 5\' 10"  (177.8 cm) Weight: 251 lb 15.8 oz (114.3 kg) IBW/kg (Calculated) : 73   Vital Signs: Temp: 97.9 F (36.6 C) (04/14 0800) Temp Source: Oral (04/14 0800) BP: 112/79 mmHg (04/14 1600) Pulse Rate: 71 (04/14 1600)  Lipid Panel     Component Value Date/Time   CHOL 969* 01/16/2016 2310   CHOL 357* 09/04/2014 0328   CHOL 1116* 04/27/2012 0939   TRIG 1121* 01/23/2016 0606   TRIG 1201* 11/04/2014 0427   HDL 21* 01/16/2016 2310   HDL SEE COMMENT 09/04/2014 0328   HDL 60 04/27/2012 0939   CHOLHDL NOT REPORTED DUE TO HIGH TRIGLYCERIDES 01/16/2016 2310   CHOLHDL 18.6* 04/27/2012 0939   VLDL UNABLE TO CALCULATE IF TRIGLYCERIDE OVER 400 mg/dL 01/16/2016 2310   VLDL SEE COMMENT 09/04/2014 0328   LDLCALC UNABLE TO CALCULATE IF TRIGLYCERIDE OVER 400 mg/dL 01/16/2016 2310   LDLCALC SEE COMMENT 09/04/2014 0328   LDLCALC Comment 04/27/2012 0939   LDLDIRECT 111.4 06/30/2012 0950   Medical History: Past Medical History  Diagnosis Date  . Recurrent acute pancreatitis     secondary to hypertriglyceridemia   . Gastric ulcer 2009  . Sleep apnea 1999    uses CPAP  . Chicken pox   . Diabetes mellitus   . HTN (hypertension)   . Familial hypertriglyceridemia     severe  . Morbid obesity ([place])     s/p bariatric sleeve surgery 01/2015  . Anxiety   . Depression     Assessment: 40 yo male with chronic pancreatitis secondary to familial hypertriglyceridemia.  Pharmacy was consulted for initiation of insulin drip for hypertriglyceridemia.Patient is also receiving D10/0.45%NS with 42mEq of potassium at 125 mL/hr.   Potassium at 1712 this evening was within normal limits at 3.7. No  additional supplementation needed.    Goal of Therapy:  Discontinue drip when Triglyceridemia < 1000 (transition to insulin pump per Endocrine) Blood Glucose 100-180.   Plan:  Continue insulin drip at 12 units/hr. Continue D10/0.45%NS with 27mEq of potassium at 150 mL/hr.   Continue to monitor Potassium Q12hr. Will f/u with next level with AM labs tomorrow.  Next TG in AM.   Pharmacy will continue to monitor and adjust per consult.   Lenis Noon, PharmD Clinical Pharmacist  01/23/2016,5:43 PM

## 2016-01-23 NOTE — Progress Notes (Signed)
MEDICATION RELATED CONSULT NOTE -Follow up  Pharmacy Consult for Insulin drip for hypertriglyceridemia; electrolyte management.    Allergies  Allergen Reactions  . Codeine Anaphylaxis  . Ivp Dye [Iodinated Diagnostic Agents] Other (See Comments)    Kidneys stop working    Patient Measurements: Height: 5\' 10"  (177.8 cm) Weight: 251 lb 15.8 oz (114.3 kg) IBW/kg (Calculated) : 73   Vital Signs: Temp: 97.9 F (36.6 C) (04/14 0800) Temp Source: Oral (04/14 0800) BP: 110/77 mmHg (04/14 1200) Pulse Rate: 84 (04/14 1200)  Lipid Panel     Component Value Date/Time   CHOL 969* 01/16/2016 2310   CHOL 357* 09/04/2014 0328   CHOL 1116* 04/27/2012 0939   TRIG 1121* 01/23/2016 0606   TRIG 1201* 11/04/2014 0427   HDL 21* 01/16/2016 2310   HDL SEE COMMENT 09/04/2014 0328   HDL 60 04/27/2012 0939   CHOLHDL NOT REPORTED DUE TO HIGH TRIGLYCERIDES 01/16/2016 2310   CHOLHDL 18.6* 04/27/2012 0939   VLDL UNABLE TO CALCULATE IF TRIGLYCERIDE OVER 400 mg/dL 01/16/2016 2310   VLDL SEE COMMENT 09/04/2014 0328   LDLCALC UNABLE TO CALCULATE IF TRIGLYCERIDE OVER 400 mg/dL 01/16/2016 2310   LDLCALC SEE COMMENT 09/04/2014 0328   LDLCALC Comment 04/27/2012 0939   LDLDIRECT 111.4 06/30/2012 0950   Medical History: Past Medical History  Diagnosis Date  . Recurrent acute pancreatitis     secondary to hypertriglyceridemia   . Gastric ulcer 2009  . Sleep apnea 1999    uses CPAP  . Chicken pox   . Diabetes mellitus   . HTN (hypertension)   . Familial hypertriglyceridemia     severe  . Morbid obesity ([employer])     s/p bariatric sleeve surgery 01/2015  . Anxiety   . Depression     Assessment: 40 yo male with chronic pancreatitis secondary to familial hypertriglyceridemia.  Pharmacy was consulted for initiation of insulin drip for hypertriglyceridemia.Patient is also receiving D10/0.45%NS with 84mEq of potassium at 125 mL/hr.    Goal of Therapy:  Discontinue drip when Triglyceridemia < 1000  (transition to insulin pump per Endocrine) Blood Glucose 100-180.   Plan:  Continue insulin drip at 12 units/hr. Continue D10/0.45%NS with 10mEq of potassium at 150 mL/hr.   Continue to monitor Potassium Q12hr. Will f/u with next level at 1700.  Next TG in AM.   Pharmacy will continue to monitor and adjust per consult.   Sergio [name], Sergio [name]  01/23/2016,2:13 PM

## 2016-01-23 NOTE — Progress Notes (Signed)
[facility] at [place] NAME: Sergio [name]    MRN#:  [id number]  DATE OF BIRTH:  02-Feb-1976  SUBJECTIVE:  Hospital Day: 6 days Sergio [name] is a 40 y.o. male presenting with Abdominal Pain .   Overnight events: No overnight events Interval Events: Remains on insulin drip states abdominal pain has improved but still present  REVIEW OF SYSTEMS:  CONSTITUTIONAL: No fever, fatigue or weakness.  EYES: No blurred or double vision.  EARS, NOSE, AND THROAT: No tinnitus or ear pain.  RESPIRATORY: No cough, shortness of breath, wheezing or hemoptysis.  CARDIOVASCULAR: No chest pain, orthopnea, edema.  GASTROINTESTINAL: No nausea, vomiting, diarrhea positive abdominal pain.  GENITOURINARY: No dysuria, hematuria.  ENDOCRINE: No polyuria, nocturia,  HEMATOLOGY: No anemia, easy bruising or bleeding SKIN: No rash or lesion. MUSCULOSKELETAL: No joint pain or arthritis.   NEUROLOGIC: No tingling, numbness, weakness.  PSYCHIATRY: No anxiety or depression.   DRUG ALLERGIES:   Allergies  Allergen Reactions  . Codeine Anaphylaxis  . Ivp Dye [Iodinated Diagnostic Agents] Other (See Comments)    Kidneys stop working    VITALS:  Blood pressure 110/77, pulse 84, temperature 97.9 F (36.6 C), temperature source Oral, resp. rate 12, height 5\' 10"  (1.778 m), weight 114.3 kg (251 lb 15.8 oz), SpO2 89 %.  PHYSICAL EXAMINATION:  VITAL SIGNS: Filed Vitals:   01/23/16 1000 01/23/16 1200  BP: 108/68 110/77  Pulse: 71 84  Temp:    Resp: 12 12   GENERAL:40 y.o.male currently in no acute distress.  HEAD: Normocephalic, atraumatic.  EYES: Pupils equal, round, reactive to light. Extraocular muscles intact. No scleral icterus.  MOUTH: Moist mucosal membrane. Dentition intact. No abscess noted.  EAR, NOSE, THROAT: Clear without exudates. No external lesions.  NECK: Supple. No thyromegaly. No nodules. No JVD.  PULMONARY: Clear to ascultation, without  wheeze rails or rhonci. No use of accessory muscles, Good respiratory effort. good air entry bilaterally CHEST: Nontender to palpation.  CARDIOVASCULAR: S1 and S2. Regular rate and rhythm. No murmurs, rubs, or gallops. No edema. Pedal pulses 2+ bilaterally.  GASTROINTESTINAL: Soft, nontender, nondistended. No masses. Positive bowel sounds. No hepatosplenomegaly.  MUSCULOSKELETAL: No swelling, clubbing, or edema. Range of motion full in all extremities.  NEUROLOGIC: Cranial nerves II through XII are intact. No gross focal neurological deficits. Sensation intact. Reflexes intact.  SKIN: No ulceration, lesions, rashes, or cyanosis. Skin warm and dry. Turgor intact.  PSYCHIATRIC: Mood, affect within normal limits. The patient is awake, alert and oriented x 3. Insight, judgment intact.      LABORATORY PANEL:   CBC  Recent Labs Lab 01/23/16 0606  WBC 4.0  HGB 12.5*  HCT 36.7*  PLT 172   ------------------------------------------------------------------------------------------------------------------  Chemistries   Recent Labs Lab 01/16/16 2310  01/21/16 0418  01/22/16 0402  01/23/16 0606  NA 116*  < > 136  --  133*  --   --   K 3.0*  < > 3.9  < > 3.7  < > 3.6  CL 83*  < > 96*  --  99*  --   --   CO2 23  < > 28  --  28  --   --   GLUCOSE 201*  < > 158*  --  114*  --   --   BUN UNABLE TO REPORT DUE TO LIPEMIC INTERFERENCE  < > <5*  --  <5*  --   --   CREATININE UNABLE TO REPORT DUE  TO LIPEMIC INTERFERENCE  < > 0.78  --  0.79  --   --   CALCIUM 8.6*  < > 8.7*  --  8.4*  --   --   MG  --   < > 2.0  --   --   --   --   AST UNABLE TO PERFORM DUE TO LIPEMIC INTERFERENCE  --   --   --   --   --   --   ALT UNABLE TO PERFORM DUE TO LIPEMIC INTERFERENCE  --   --   --   --   --   --   ALKPHOS 45  --   --   --   --   --   --   BILITOT <0.1*  --   --   --   --   --   --   < > = values in this interval not  displayed. ------------------------------------------------------------------------------------------------------------------  Cardiac Enzymes No results for input(s): TROPONINI in the last 168 hours. ------------------------------------------------------------------------------------------------------------------  RADIOLOGY:  No results found.  EKG:   Orders placed or performed during the hospital encounter of 01/17/16  . EKG 12-Lead  . EKG 12-Lead    ASSESSMENT AND PLAN:   Sergio [name] is a 40 y.o. male presenting with Abdominal Pain . Admitted 01/17/2016 : Day #: 6 days   1. Hypertriglyceridemia induced pancreatitis -Continue insulin drip 0.1 unit per kilo per hour adjusting IV fluids to maintain blood glucose Follow triglycerides daily -Once symptoms improve and triglycerides are down <1000, can start on clear liquids. -Restarted gemfibrozil and Crestor. Crestor dose increased. - Endocrinology consult appreciated  2 hypokalemia-being replaced aggressively, now normalized.  -Continue to monitor if we need to back down on the KCl in the fluids  3 obstructive sleep apnea-continue CPAP  4 depression-continue home medications- on trazodone, Zoloft and Xanax  5 DM- on insulin pump at home, currently on hold as on insulin drip - monitor and restart once off of drip. Appreciate endocrinology input.  6 DVT prophylaxis-on Lovenox   All the records are reviewed and case discussed with Care Management/Social Workerr. Management plans discussed with the patient, family and they are in agreement.  CODE STATUS: full TOTAL TIME TAKING CARE OF THIS PATIENT: 28 minutes.   POSSIBLE D/C IN 2-3DAYS, DEPENDING ON CLINICAL CONDITION.   Yanil Dawe,  [name].D on 01/23/2016 at 12:45 PM  Between 7am to 6pm - Pager - [phone]  After 6pm: House Pager: - [facility]  Office  [phone]  CC: Primary care physician; Crecencio Mc, MD

## 2016-01-24 LAB — GLUCOSE, CAPILLARY
Glucose-Capillary: 106 mg/dL — ABNORMAL HIGH (ref 65–99)
Glucose-Capillary: 131 mg/dL — ABNORMAL HIGH (ref 65–99)
Glucose-Capillary: 160 mg/dL — ABNORMAL HIGH (ref 65–99)
Glucose-Capillary: 69 mg/dL (ref 65–99)
Glucose-Capillary: 71 mg/dL (ref 65–99)
Glucose-Capillary: 74 mg/dL (ref 65–99)
Glucose-Capillary: 78 mg/dL (ref 65–99)
Glucose-Capillary: 81 mg/dL (ref 65–99)
Glucose-Capillary: 83 mg/dL (ref 65–99)
Glucose-Capillary: 93 mg/dL (ref 65–99)

## 2016-01-24 LAB — POTASSIUM: Potassium: 4.2 mmol/L (ref 3.5–5.1)

## 2016-01-24 LAB — BASIC METABOLIC PANEL
Anion gap: 5 (ref 5–15)
BUN: 5 mg/dL — AB (ref 6–20)
CHLORIDE: 98 mmol/L — AB (ref 101–111)
CO2: 29 mmol/L (ref 22–32)
CREATININE: 1.03 mg/dL (ref 0.61–1.24)
Calcium: 8.5 mg/dL — ABNORMAL LOW (ref 8.9–10.3)
GFR calc Af Amer: >60 mL/min (ref >60)
GFR calc non Af Amer: >60 mL/min (ref >60)
GLUCOSE: 220 mg/dL — AB (ref 65–99)
POTASSIUM: 3.5 mmol/L (ref 3.5–5.1)
Sodium: 132 mmol/L — ABNORMAL LOW (ref 135–145)

## 2016-01-24 LAB — TRIGLYCERIDES: Triglycerides: 913 mg/dL — ABNORMAL HIGH (ref <150)

## 2016-01-24 MED ORDER — NYSTATIN 100000 UNIT/GM EX POWD
Freq: Two times a day (BID) | CUTANEOUS | Status: DC
  Administered 2016-01-24 – 2016-01-25 (×3): via TOPICAL
  Filled 2016-01-24: qty 15

## 2016-01-24 MED ORDER — INSULIN PUMP
Freq: Three times a day (TID) | SUBCUTANEOUS | Status: DC
  Administered 2016-01-24: 12:00:00 via SUBCUTANEOUS
  Administered 2016-01-24: 22.6 via SUBCUTANEOUS
  Administered 2016-01-25 – 2016-01-27 (×9): via SUBCUTANEOUS
  Filled 2016-01-24: qty 1

## 2016-01-24 NOTE — Progress Notes (Signed)
Call received from RN.  Patient to restart insulin pump and insulin drip to be d/c's..  Discussed contract and flow sheet. Order set was placed by MD.  No further recommendations at this time.  Thanks, Adah Perl, RN, BC-ADM Inpatient Diabetes Coordinator Pager [phone] (8a-5p)

## 2016-01-24 NOTE — Progress Notes (Signed)
Report called to [facility] on 1A

## 2016-01-24 NOTE — Progress Notes (Signed)
[facility] at [place] NAME: Sergio [name]    MRN#:  [id number]  DATE OF BIRTH:  03/26/1976  SUBJECTIVE:  Hospital Day: 7 days Sergio [name] is a 40 y.o. male presenting with Abdominal Pain .   Overnight events: No overnight events Interval Events: Still some complaints of abdominal pain though minimal  REVIEW OF SYSTEMS:  CONSTITUTIONAL: No fever, fatigue or weakness.  EYES: No blurred or double vision.  EARS, NOSE, AND THROAT: No tinnitus or ear pain.  RESPIRATORY: No cough, shortness of breath, wheezing or hemoptysis.  CARDIOVASCULAR: No chest pain, orthopnea, edema.  GASTROINTESTINAL: No nausea, vomiting, diarrhea positive abdominal pain.  GENITOURINARY: No dysuria, hematuria.  ENDOCRINE: No polyuria, nocturia,  HEMATOLOGY: No anemia, easy bruising or bleeding SKIN: No rash or lesion. MUSCULOSKELETAL: No joint pain or arthritis.   NEUROLOGIC: No tingling, numbness, weakness.  PSYCHIATRY: No anxiety or depression.   DRUG ALLERGIES:   Allergies  Allergen Reactions  . Codeine Anaphylaxis  . Ivp Dye [Iodinated Diagnostic Agents] Other (See Comments)    Kidneys stop working    VITALS:  Blood pressure 119/81, pulse 68, temperature 98.4 F (36.9 C), temperature source Oral, resp. rate 14, height 5\' 10"  (1.778 m), weight 113.8 kg (250 lb 14.1 oz), SpO2 97 %.  PHYSICAL EXAMINATION:  VITAL SIGNS: Filed Vitals:   01/24/16 1100 01/24/16 1300  BP:  119/81  Pulse:    Temp:  98.4 F (36.9 C)  Resp: 14    GENERAL:40 y.o.male currently in no acute distress.  HEAD: Normocephalic, atraumatic.  EYES: Pupils equal, round, reactive to light. Extraocular muscles intact. No scleral icterus.  MOUTH: Moist mucosal membrane. Dentition intact. No abscess noted.  EAR, NOSE, THROAT: Clear without exudates. No external lesions.  NECK: Supple. No thyromegaly. No nodules. No JVD.  PULMONARY: Clear to ascultation, without wheeze rails or  rhonci. No use of accessory muscles, Good respiratory effort. good air entry bilaterally CHEST: Nontender to palpation.  CARDIOVASCULAR: S1 and S2. Regular rate and rhythm. No murmurs, rubs, or gallops. No edema. Pedal pulses 2+ bilaterally.  GASTROINTESTINAL: Soft, nontender, nondistended. No masses. Positive bowel sounds. No hepatosplenomegaly.  MUSCULOSKELETAL: No swelling, clubbing, or edema. Range of motion full in all extremities.  NEUROLOGIC: Cranial nerves II through XII are intact. No gross focal neurological deficits. Sensation intact. Reflexes intact.  SKIN: No ulceration, lesions, rashes, or cyanosis. Skin warm and dry. Turgor intact.  PSYCHIATRIC: Mood, affect within normal limits. The patient is awake, alert and oriented x 3. Insight, judgment intact.      LABORATORY PANEL:   CBC  Recent Labs Lab 01/23/16 0606  WBC 4.0  HGB 12.5*  HCT 36.7*  PLT 172   ------------------------------------------------------------------------------------------------------------------  Chemistries   Recent Labs Lab 01/21/16 0418  01/24/16 0511  NA 136  < > 132*  K 3.9  < > 3.5  CL 96*  < > 98*  CO2 28  < > 29  GLUCOSE 158*  < > 220*  BUN <5*  < > 5*  CREATININE 0.78  < > 1.03  CALCIUM 8.7*  < > 8.5*  MG 2.0  --   --   < > = values in this interval not displayed. ------------------------------------------------------------------------------------------------------------------  Cardiac Enzymes No results for input(s): TROPONINI in the last 168 hours. ------------------------------------------------------------------------------------------------------------------  RADIOLOGY:  No results found.  EKG:   Orders placed or performed during the hospital encounter of 01/17/16  . EKG 12-Lead  . EKG 12-Lead  ASSESSMENT AND PLAN:   Sergio [name] is a 40 y.o. male presenting with Abdominal Pain . Admitted 01/17/2016 : Day #: 7 days   1. Hypertriglyceridemia induced  pancreatitis Transition from IV insulin to insulin pump, consistent clear diabetic diet advanced as tolerated  2 hypokalemia-being replaced aggressively, now normalized.  -Continue to monitor if we need to back down on the KCl in the fluids  3 obstructive sleep apnea-continue CPAP  4 depression-continue home medications- on trazodone, Zoloft and Xanax  5 DM- on insulin pump at home, currently on hold as on insulin drip - monitor and restart once off of drip. Appreciate endocrinology input.  6 DVT prophylaxis-on Lovenox   All the records are reviewed and case discussed with Care Management/Social Workerr. Management plans discussed with the patient, family and they are in agreement.  CODE STATUS: full TOTAL TIME TAKING CARE OF THIS PATIENT: 28 minutes.   POSSIBLE D/C IN 2-3DAYS, DEPENDING ON CLINICAL CONDITION.   Sergio [name],  [name].D on 01/24/2016 at 1:43 PM  Between 7am to 6pm - Pager - [phone]  After 6pm: House Pager: - [facility]  Office  [phone]  CC: Primary care physician; Sergio Mc, MD

## 2016-01-24 NOTE — Progress Notes (Signed)
Dr. Doy Hutching returned call new orders given

## 2016-01-24 NOTE — Progress Notes (Signed)
Patient complaints of inability to urinate. Bladder scan reveled >515ml's. Patient has new onset of rash to scrotum and right thigh. Prime doc paged awaiting response.

## 2016-01-24 NOTE — Care Management Note (Addendum)
Case Management Note  Patient Details  Name: Sergio [name] MRN: [id number] Date of Birth: June 26, 1976  Subjective/Objective:     40yo Mr Sergio [name] was admitted to ICU on 01/17/16 with Pancreatitis and Triglycerides >5000. Hx: Recurrent pancreatitis, gastric ulcer, Sleep Apnea, IDDM, HTN, Familial hypertriglyceridemia.  He resides with his wife. PCP=Dr Tullo. His Endocrinologist is Dr Nilda Simmer. Pharmacy=[facility] on [address] in [place]. No home oxygen. No home health services. Home equipment includes an Insulin Pump and a CPAP machine. Case management will follow for discharge planning.               Action/Plan:   Expected Discharge Date:                  Expected Discharge Plan:     In-House Referral:     Discharge planning Services     Post Acute Care Choice:    Choice offered to:     DME Arranged:    DME Agency:     HH Arranged:    [place] Agency:     Status of Service:     Medicare Important Message Given:    Date Medicare IM Given:    Medicare IM give by:    Date Additional Medicare IM Given:    Additional Medicare Important Message give by:     If discussed at [place] of Stay Meetings, dates discussed:    Additional Comments:  Jenia Klepper A, RN 01/24/2016, 4:22 PM

## 2016-01-24 NOTE — Progress Notes (Signed)
Patient resting between care, 1amp of dextrose given for blood sugar of 69 per MD order.  VSS see SCM for further update.

## 2016-01-24 NOTE — Progress Notes (Signed)
MEDICATION RELATED CONSULT NOTE -Follow up  Pharmacy Consult for Insulin drip for hypertriglyceridemia; electrolyte management.    Allergies  Allergen Reactions  . Codeine Anaphylaxis  . Ivp Dye [Iodinated Diagnostic Agents] Other (See Comments)    Kidneys stop working    Patient Measurements: Height: 5\' 10"  (177.8 cm) Weight: 250 lb 14.1 oz (113.8 kg) IBW/kg (Calculated) : 73   Vital Signs: Temp: 98.4 F (36.9 C) (04/15 0800) Temp Source: Oral (04/15 0300) BP: 112/79 mmHg (04/15 0800) Pulse Rate: 63 (04/15 0800)  Lipid Panel     Component Value Date/Time   CHOL 969* 01/16/2016 2310   CHOL 357* 09/04/2014 0328   CHOL 1116* 04/27/2012 0939   TRIG 913* 01/24/2016 0516   TRIG 1201* 11/04/2014 0427   HDL 21* 01/16/2016 2310   HDL SEE COMMENT 09/04/2014 0328   HDL 60 04/27/2012 0939   CHOLHDL NOT REPORTED DUE TO HIGH TRIGLYCERIDES 01/16/2016 2310   CHOLHDL 18.6* 04/27/2012 0939   VLDL UNABLE TO CALCULATE IF TRIGLYCERIDE OVER 400 mg/dL 01/16/2016 2310   VLDL SEE COMMENT 09/04/2014 0328   LDLCALC UNABLE TO CALCULATE IF TRIGLYCERIDE OVER 400 mg/dL 01/16/2016 2310   LDLCALC SEE COMMENT 09/04/2014 0328   LDLCALC Comment 04/27/2012 0939   LDLDIRECT 111.4 06/30/2012 0950   Medical History: Past Medical History  Diagnosis Date  . Recurrent acute pancreatitis     secondary to hypertriglyceridemia   . Gastric ulcer 2009  . Sleep apnea 1999    uses CPAP  . Chicken pox   . Diabetes mellitus   . HTN (hypertension)   . Familial hypertriglyceridemia     severe  . Morbid obesity ([place])     s/p bariatric sleeve surgery 01/2015  . Anxiety   . Depression     Assessment: 40 yo male with chronic pancreatitis secondary to familial hypertriglyceridemia.  Pharmacy was consulted for initiation of insulin drip for hypertriglyceridemia.Patient is also receiving D10/0.45%NS with 67mEq of potassium at 125 mL/hr.   Goal of Therapy:  Transition from insulin drip to pump when TG <1000.   Blood Glucose 100-180.   Plan:  Triglycerides = 913.  Insulin drip has been transitioned to insulin pump as per Endocrinology. Continue D10/0.45%NS with 73mEq of potassium at 150 mL/hr.   K = 3.5.  No supplementation needed at this time.  Continue to monitor Potassium Q12hr. Will f/u with next level 4/15 at 17:00.    Pharmacy will continue to monitor and adjust per consult.   Olivia Canter, RPh Clinical Pharmacist  01/24/2016,9:05 AM

## 2016-01-24 NOTE — Progress Notes (Signed)
1115 Contract for Insulin pump therapy and flow sheet explained to patient. Checked own blood sugar- 58. Given apple juice-clear liquid diet. When re-checked in 5 min.his blood sugar 62

## 2016-01-24 NOTE — Progress Notes (Signed)
MEDICATION RELATED CONSULT NOTE -Follow up  Pharmacy Consult for Insulin drip for hypertriglyceridemia; electrolyte management.    Allergies  Allergen Reactions  . Codeine Anaphylaxis  . Ivp Dye [Iodinated Diagnostic Agents] Other (See Comments)    Kidneys stop working    Patient Measurements: Height: 5\' 10"  (177.8 cm) Weight: 250 lb 14.1 oz (113.8 kg) IBW/kg (Calculated) : 73   Vital Signs: Temp: 97.8 F (36.6 C) (04/15 1529) Temp Source: Oral (04/15 1529) BP: 132/84 mmHg (04/15 1529) Pulse Rate: 79 (04/15 1529)  Lipid Panel     Component Value Date/Time   CHOL 969* 01/16/2016 2310   CHOL 357* 09/04/2014 0328   CHOL 1116* 04/27/2012 0939   TRIG 913* 01/24/2016 0516   TRIG 1201* 11/04/2014 0427   HDL 21* 01/16/2016 2310   HDL SEE COMMENT 09/04/2014 0328   HDL 60 04/27/2012 0939   CHOLHDL NOT REPORTED DUE TO HIGH TRIGLYCERIDES 01/16/2016 2310   CHOLHDL 18.6* 04/27/2012 0939   VLDL UNABLE TO CALCULATE IF TRIGLYCERIDE OVER 400 mg/dL 01/16/2016 2310   VLDL SEE COMMENT 09/04/2014 0328   LDLCALC UNABLE TO CALCULATE IF TRIGLYCERIDE OVER 400 mg/dL 01/16/2016 2310   LDLCALC SEE COMMENT 09/04/2014 0328   LDLCALC Comment 04/27/2012 0939   LDLDIRECT 111.4 06/30/2012 0950   Medical History: Past Medical History  Diagnosis Date  . Recurrent acute pancreatitis     secondary to hypertriglyceridemia   . Gastric ulcer 2009  . Sleep apnea 1999    uses CPAP  . Chicken pox   . Diabetes mellitus   . HTN (hypertension)   . Familial hypertriglyceridemia     severe  . Morbid obesity ([place])     s/p bariatric sleeve surgery 01/2015  . Anxiety   . Depression     Assessment: 40 yo male with chronic pancreatitis secondary to familial hypertriglyceridemia.  Pharmacy was consulted for initiation of insulin drip for hypertriglyceridemia.Patient is also receiving D10/0.45%NS with 28mEq of potassium at 125 mL/hr.   Goal of Therapy:  Transition from insulin drip to pump when TG <1000.   Blood Glucose 100-180.   Plan:  Triglycerides = 913.  Insulin drip has been transitioned to insulin pump as per Endocrinology. Continue D10/0.45%NS with 10mEq of potassium at 150 mL/hr.   K = 3.5.  No supplementation needed at this time.  Continue to monitor Potassium Q12hr. Will f/u with next level 4/15 at 17:00.    4/15 :  K @ 16:51 = 4.2 .  No supplementation needed at this time.  Will recheck K on 4/16 with AM labs.   Pharmacy will continue to monitor and adjust per consult.   [place] Pharmacist  01/24/2016,6:04 PM

## 2016-01-24 NOTE — Progress Notes (Signed)
1140 Blood sugar now 100.

## 2016-01-25 DIAGNOSIS — N319 Neuromuscular dysfunction of bladder, unspecified: Secondary | ICD-10-CM

## 2016-01-25 DIAGNOSIS — R338 Other retention of urine: Secondary | ICD-10-CM

## 2016-01-25 LAB — GLUCOSE, CAPILLARY
Glucose-Capillary: 64 mg/dL — ABNORMAL LOW (ref 65–99)
Glucose-Capillary: 80 mg/dL (ref 65–99)
Glucose-Capillary: 107 mg/dL — ABNORMAL HIGH (ref 65–99)
Glucose-Capillary: 132 mg/dL — ABNORMAL HIGH (ref 65–99)
Glucose-Capillary: 117 mg/dL — ABNORMAL HIGH (ref 65–99)

## 2016-01-25 LAB — TRIGLYCERIDES: Triglycerides: 958 mg/dL — ABNORMAL HIGH (ref <150)

## 2016-01-25 LAB — POTASSIUM
POTASSIUM: 3.4 mmol/L — AB (ref 3.5–5.1)
Potassium: 3.9 mmol/L (ref 3.5–5.1)

## 2016-01-25 MED ORDER — POTASSIUM CHLORIDE CRYS ER 20 MEQ PO TBCR: 40.0000 meq | EXTENDED_RELEASE_TABLET | Freq: Once | ORAL | Status: DC

## 2016-01-25 MED ORDER — FINASTERIDE 5 MG PO TABS
5.0000 mg | ORAL_TABLET | Freq: Every day | ORAL | Status: DC
  Administered 2016-01-25 – 2016-01-27 (×3): 5 mg via ORAL
  Filled 2016-01-25 (×3): qty 1

## 2016-01-25 NOTE — Progress Notes (Signed)
Offered to assist pt with home CPAP placement. Pt refused. Pt states that he will place when he is ready to wear CPAP. Pt's home CPAP is plugged into a red outlet with no obvious damage to home CPAP

## 2016-01-25 NOTE — Progress Notes (Signed)
MEDICATION RELATED CONSULT NOTE -Follow up  Pharmacy Consult for Insulin drip for hypertriglyceridemia; electrolyte management.    Allergies  Allergen Reactions  . Codeine Anaphylaxis  . Ivp Dye [Iodinated Diagnostic Agents] Other (See Comments)    Kidneys stop working    Patient Measurements: Height: 5\' 10"  (177.8 cm) Weight: 246 lb 6.4 oz (111.766 kg) IBW/kg (Calculated) : 73   Vital Signs: Temp: 97.6 F (36.4 C) (04/16 0316) Temp Source: Oral (04/16 0316) BP: 139/90 mmHg (04/16 0316) Pulse Rate: 75 (04/16 0316)  Lipid Panel     Component Value Date/Time   CHOL 969* 01/16/2016 2310   CHOL 357* 09/04/2014 0328   CHOL 1116* 04/27/2012 0939   TRIG 913* 01/24/2016 0516   TRIG 1201* 11/04/2014 0427   HDL 21* 01/16/2016 2310   HDL SEE COMMENT 09/04/2014 0328   HDL 60 04/27/2012 0939   CHOLHDL NOT REPORTED DUE TO HIGH TRIGLYCERIDES 01/16/2016 2310   CHOLHDL 18.6* 04/27/2012 0939   VLDL UNABLE TO CALCULATE IF TRIGLYCERIDE OVER 400 mg/dL 01/16/2016 2310   VLDL SEE COMMENT 09/04/2014 0328   LDLCALC UNABLE TO CALCULATE IF TRIGLYCERIDE OVER 400 mg/dL 01/16/2016 2310   LDLCALC SEE COMMENT 09/04/2014 0328   LDLCALC Comment 04/27/2012 0939   LDLDIRECT 111.4 06/30/2012 0950   Medical History: Past Medical History  Diagnosis Date  . Recurrent acute pancreatitis     secondary to hypertriglyceridemia   . Gastric ulcer 2009  . Sleep apnea 1999    uses CPAP  . Chicken pox   . Diabetes mellitus   . HTN (hypertension)   . Familial hypertriglyceridemia     severe  . Morbid obesity ([place])     s/p bariatric sleeve surgery 01/2015  . Anxiety   . Depression     Assessment: 40 yo male with chronic pancreatitis secondary to familial hypertriglyceridemia.  Pharmacy was consulted for initiation of insulin drip for hypertriglyceridemia.Patient is also receiving D10/0.45%NS with 14mEq of potassium at 125 mL/hr.   Goal of Therapy:  Transition from insulin drip to pump when TG <1000.   Blood Glucose 100-180.   Plan:  Triglycerides = 913.  Insulin drip has been transitioned to insulin pump as per Endocrinology. Continue D10/0.45%NS with 58mEq of potassium at 150 mL/hr.   K = 3.5.  No supplementation needed at this time.  Continue to monitor Potassium Q12hr. Will f/u with next level 4/15 at 17:00.    4/15 :  K @ 16:51 = 4.2 .  No supplementation needed at this time.  Will recheck K on 4/16 with AM labs.   04/16 AM K 3.4. Ordered potassium chloride 40 mEq po x 1 and will recheck electrolytes with AM labs. The order for IV fluids with potassium remains active.   Pharmacy will continue to monitor and adjust per consult.   Laural Benes, Pharm.D., BCPS Clinical Pharmacist  01/25/2016,6:13 AM

## 2016-01-25 NOTE — Progress Notes (Signed)
Patient ID: Sergio [name], male   DOB: Aug 14, 1976, 40 y.o.   MRN: [id number]    Subjective: CC: can't pee.  Hx:  I was asked to see Sergio [name] for urinary retention.   He was seen and cysto'd by Sergio [name] on 4/8 and was noted to have a PVR of 871ml.   His foley was removed, but I can't see exactly when but last night he felt he needed to void but couldn't.  A [place] PVR was >561ml and a foley was placed and according to the patient promptly drained 1866ml.   He has a history of peripheral neuropathy following spinal surgery and also has IDDM.   He was found to have no strictures and a wide open bladder neck on recent cystoscopy.   He is on tamsulosin.   ROS:  Review of Systems  Constitutional: Negative for fever.  Gastrointestinal: Negative for abdominal pain.  Genitourinary: Negative for hematuria.    Anti-infectives: Anti-infectives    None      Current Facility-Administered Medications  Medication Dose Route Frequency Provider Last Rate Last Dose  . acetaminophen (TYLENOL) tablet 650 mg  650 mg Oral Q6H PRN Sergio Foreman, MD   650 mg at 01/19/16 1407   Or  . acetaminophen (TYLENOL) suppository 650 mg  650 mg Rectal Q6H PRN Sergio Foreman, MD      . ALPRAZolam Duanne Moron) tablet 0.5 mg  0.5 mg Oral Q6H PRN Sergio Butte, MD   0.5 mg at 01/24/16 2205  . dextrose 10 % 1,000 mL with sodium chloride 0.45 %, potassium chloride 40 mEq/L infusion   Intravenous Continuous Sergio Cong, MD 150 mL/hr at 01/24/16 0524    . dextrose 50 % solution 25 mL  25 mL Intravenous PRN Sergio Lighter, MD      . dextrose 50 % solution 50 mL  50 mL Intravenous PRN Sergio Lighter, MD   50 mL at 01/24/16 0511  . docusate sodium (COLACE) capsule 100 mg  100 mg Oral BID Sergio Foreman, MD   100 mg at 01/24/16 [id number]  . enoxaparin (LOVENOX) injection 40 mg  40 mg Subcutaneous Q24H Sergio Lighter, MD   40 mg at 01/24/16 2211  . finasteride (PROSCAR) tablet 5 mg  5 mg Oral Daily Sergio [place]te, MD   5 mg at  01/25/16 0902  . gemfibrozil (LOPID) tablet 600 mg  600 mg Oral BID AC Sergio Foreman, MD   600 mg at 01/25/16 0759  . HYDROmorphone (DILAUDID) injection 2 mg  2 mg Intravenous Q3H PRN Sergio Foreman, MD   2 mg at 01/25/16 0902  . insulin pump   Subcutaneous TID AC, HS, 0200 Sergio Butte, MD      . nystatin (MYCOSTATIN/NYSTOP) topical powder   Topical BID Sergio Crouch, MD      . omega-3 acid ethyl esters (LOVAZA) capsule 4 g  4 g Oral Daily Sergio Foreman, MD   4 g at 01/24/16 2204  . ondansetron (ZOFRAN) tablet 4 mg  4 mg Oral Q6H PRN Sergio Foreman, MD       Or  . ondansetron [facility]) injection 4 mg  4 mg Intravenous Q6H PRN Sergio Foreman, MD   4 mg at 01/23/16 1630  . potassium chloride SA (K-DUR,KLOR-CON) CR tablet 40 mEq  40 mEq Oral Once Sergio [place], MD      . pregabalin (LYRICA) capsule 300 mg  300 mg Oral Daily  Sergio Foreman, MD   300 mg at 01/24/16 2205  . rosuvastatin (CRESTOR) tablet 20 mg  20 mg Oral q1800 Sergio Lighter, MD   20 mg at 01/24/16 1727  . sertraline (ZOLOFT) tablet 50 mg  50 mg Oral Daily Sergio Foreman, MD   50 mg at 01/25/16 0902  . sodium chloride flush (NS) 0.9 % injection 3 mL  3 mL Intravenous Q12H Sergio Foreman, MD   3 mL at 01/25/16 0910  . tamsulosin (FLOMAX) capsule 0.4 mg  0.4 mg Oral Daily Sergio Lighter, MD   0.4 mg at 01/25/16 0902  . traZODone (DESYREL) tablet 100 mg  100 mg Oral QHS Sergio Foreman, MD   100 mg at 01/24/16 2204     Objective: Vital signs in last 24 hours: Temp:  [97.6 F (36.4 C)-98.6 F (37 C)] 97.7 F (36.5 C) (04/16 0736) Pulse Rate:  [64-79] 64 (04/16 0736) Resp:  [10-18] 14 (04/16 0736) BP: (116-139)/(73-90) 134/88 mmHg (04/16 0736) SpO2:  [92 %-100 %] 100 % (04/16 0736) Weight:  [111.766 kg (246 lb 6.4 oz)] 111.766 kg (246 lb 6.4 oz) (04/16 0523)  Intake/Output from previous day: 04/15 0701 - 04/16 0700 In: [id number] [P.O.:720; I.V.:813] Out: 3450 [Urine:3450] Intake/Output this  shift:     Physical Exam  Constitutional: He is well-developed, well-nourished, and in no distress.  Genitourinary:  Foley draining clear urine.   Vitals reviewed.   Lab Results:   Recent Labs  01/23/16 0606  WBC 4.0  HGB 12.5*  HCT 36.7*  PLT 172   BMET  Recent Labs  01/24/16 0511 01/24/16 1651 01/25/16 0452  NA 132*  --   --   K 3.5 4.2 3.4*  CL 98*  --   --   CO2 29  --   --   GLUCOSE 220*  --   --   BUN 5*  --   --   CREATININE 1.03  --   --   CALCIUM 8.5*  --   --    PT/INR No results for input(s): LABPROT, INR in the last 72 hours. ABG No results for input(s): PHART, HCO3 in the last 72 hours.  Invalid input(s): PCO2, PO2  Studies/Results: No results found.  Labs and recent hospital notes reviewed.   Assessment and Plan: He has urinary retention that appears to be secondary to a hypotonic bladder that could be from either his prior spinal surgery and associated neuropathy or diabetic cystopathy.  If he truly had 1812ml in his bladder, it might take several weeks to recover.   I would recommend he be discharged with a foley and he should f/u either with Sergio [name] who has seen him in the past or with [name] for a voiding trial in about 2 weeks.   He may need to be instructed in self cath at some point.        LOS: 8 days    Messiyah Waterson J 01/25/2016 [phone]

## 2016-01-25 NOTE — Discharge Instructions (Signed)
Foley Catheter Care, Adult °A Foley catheter is a soft, flexible tube that is placed into the bladder to drain urine. A Foley catheter may be inserted if: °· You leak urine or are not able to control when you urinate (urinary incontinence). °· You are not able to urinate when you need to (urinary retention). °· You had prostate surgery or surgery on the genitals. °· You have certain medical conditions, such as multiple sclerosis, dementia, or a spinal cord injury. °If you are going home with a Foley catheter in place, follow the instructions below. °TAKING CARE OF THE CATHETER °1. Wash your hands with soap and water. °2. Using mild soap and warm water on a clean washcloth: °¨ Clean the area on your body closest to the catheter insertion site using a circular motion, moving away from the catheter. Never wipe toward the catheter because this could sweep bacteria up into the urethra and cause infection. °¨ Remove all traces of soap. Pat the area dry with a clean towel. For males, reposition the foreskin. °3. Attach the catheter to your leg so there is no tension on the catheter. Use adhesive tape or a leg strap. If you are using adhesive tape, remove any sticky residue left behind by the previous tape you used. °4. Keep the drainage bag below the level of the bladder, but keep it off the floor. °5. Check throughout the day to be sure the catheter is working and urine is draining freely. Make sure the tubing does not become kinked. °6. Do not pull on the catheter or try to remove it. Pulling could damage internal tissues. °TAKING CARE OF THE DRAINAGE BAGS °You will be given two drainage bags to take home. One is a large overnight drainage bag, and the other is a smaller leg bag that fits underneath clothing. You may wear the overnight bag at any time, but you should never wear the smaller leg bag at night. Follow the instructions below for how to empty, change, and clean your drainage bags. °Emptying the Drainage  Bag °You must empty your drainage bag when it is  -½ full or at least 2-3 times a day. °1. Wash your hands with soap and water. °2. Keep the drainage bag below your hips, below the level of your bladder. This stops urine from going back into the tubing and into your bladder. °3. Hold the dirty bag over the toilet or a clean container. °4. Open the pour spout at the bottom of the bag and empty the urine into the toilet or container. Do not let the pour spout touch the toilet, container, or any other surface. Doing so can place bacteria on the bag, which can cause an infection. °5. Clean the pour spout with a gauze pad or cotton ball that has rubbing alcohol on it. °6. Close the pour spout. °7. Attach the bag to your leg with adhesive tape or a leg strap. °8. Wash your hands well. °Changing the Drainage Bag °Change your drainage bag once a month or sooner if it starts to smell bad or look dirty. Below are steps to follow when changing the drainage bag. °1. Wash your hands with soap and water. °2. Pinch off the rubber catheter so that urine does not spill out. °3. Disconnect the catheter tube from the drainage tube at the connection valve. Do not let the tubes touch any surface. °4. Clean the end of the catheter tube with an alcohol wipe. Use a different alcohol wipe to clean   the end of the drainage tube. °5. Connect the catheter tube to the drainage tube of the clean drainage bag. °6. Attach the new bag to the leg with adhesive tape or a leg strap. Avoid attaching the new bag too tightly. °7. Wash your hands well. °Cleaning the Drainage Bag °1. Wash your hands with soap and water. °2. Wash the bag in warm, soapy water. °3. Rinse the bag thoroughly with warm water. °4. Fill the bag with a solution of white vinegar and water (1 cup vinegar to 1 qt warm water [.2 L vinegar to 1 L warm water]). Close the bag and soak it for 30 minutes in the solution. °5. Rinse the bag with warm water. °6. Hang the bag to dry with the  pour spout open and hanging downward. °7. Store the clean bag (once it is dry) in a clean plastic bag. °8. Wash your hands well. °PREVENTING INFECTION °· Wash your hands before and after handling your catheter. °· Take showers daily and wash the area where the catheter enters your body. Do not take baths. Replace wet leg straps with dry ones, if this applies. °· Do not use powders, sprays, or lotions on the genital area. Only use creams, lotions, or ointments as directed by your caregiver. °· For females, wipe from front to back after each bowel movement. °· Drink enough fluids to keep your urine clear or pale yellow unless you have a fluid restriction. °· Do not let the drainage bag or tubing touch or lie on the floor. °· Wear cotton underwear to absorb moisture and to keep your skin drier. °SEEK MEDICAL CARE IF:  °· Your urine is cloudy or smells unusually bad. °· Your catheter becomes clogged. °· You are not draining urine into the bag or your bladder feels full. °· Your catheter starts to leak. °SEEK IMMEDIATE MEDICAL CARE IF:  °· You have pain, swelling, redness, or pus where the catheter enters the body. °· You have pain in the abdomen, legs, lower back, or bladder. °· You have a fever. °· You see blood fill the catheter, or your urine is pink or red. °· You have nausea, vomiting, or chills. °· Your catheter gets pulled out. °MAKE SURE YOU:  °· Understand these instructions. °· Will watch your condition. °· Will get help right away if you are not doing well or get worse. °  °This information is not intended to replace advice given to you by your health care provider. Make sure you discuss any questions you have with your health care provider. °  °Document Released: 09/27/2005 Document Revised: 02/11/2014 Document Reviewed: 09/18/2012 °Elsevier Interactive Patient Education ©2016 [employer] ° °

## 2016-01-25 NOTE — Plan of Care (Signed)
Problem: Activity: Goal: Risk for activity intolerance will decrease Outcome: Completed/Met Date Met:  01/25/16 Pt up to BR no assist, pt assisted staff in making bed.

## 2016-01-25 NOTE — Plan of Care (Signed)
Problem: Pain Managment: Goal: General experience of comfort will improve Outcome: Progressing Pain controlled with PRN medication. No acute distress noted. Patient resting between care

## 2016-01-25 NOTE — Progress Notes (Signed)
[facility] [facility] [place] NAME: Sergio [name]    MRN#:  [id number]  DATE OF BIRTH:  1976/09/18  SUBJECTIVE:  Hospital Day: 8 days Sergio [name] is a 40 y.o. male presenting with Abdominal Pain .   Overnight events: foley replaced after retention Interval Events: slow improvement with abdominal pain  REVIEW OF SYSTEMS:  CONSTITUTIONAL: No fever, fatigue or weakness.  EYES: No blurred or double vision.  EARS, NOSE, AND THROAT: No tinnitus or ear pain.  RESPIRATORY: No cough, shortness of breath, wheezing or hemoptysis.  CARDIOVASCULAR: No chest pain, orthopnea, edema.  GASTROINTESTINAL: No nausea, vomiting, diarrhea positive abdominal pain.  GENITOURINARY: No dysuria, hematuria.  ENDOCRINE: No polyuria, nocturia,  HEMATOLOGY: No anemia, easy bruising or bleeding SKIN: No rash or lesion. MUSCULOSKELETAL: No joint pain or arthritis.   NEUROLOGIC: No tingling, numbness, weakness.  PSYCHIATRY: No anxiety or depression.   DRUG ALLERGIES:   Allergies  Allergen Reactions  . Codeine Anaphylaxis  . Ivp Dye [Iodinated Diagnostic Agents] Other (See Comments)    Kidneys stop working    VITALS:  Blood pressure 134/88, pulse 64, temperature 97.7 F (36.5 C), temperature source Oral, resp. rate 14, height 5\' 10"  (1.778 m), weight 111.766 kg (246 lb 6.4 oz), SpO2 100 %.  PHYSICAL EXAMINATION:  VITAL SIGNS: Filed Vitals:   01/25/16 0316 01/25/16 0736  BP: 139/90 134/88  Pulse: 75 64  Temp: 97.6 F (36.4 C) 97.7 F (36.5 C)  Resp: 18 14   GENERAL:40 y.o.male currently in no acute distress.  HEAD: Normocephalic, atraumatic.  EYES: Pupils equal, round, reactive to light. Extraocular muscles intact. No scleral icterus.  MOUTH: Moist mucosal membrane. Dentition intact. No abscess noted.  EAR, NOSE, THROAT: Clear without exudates. No external lesions.  NECK: Supple. No thyromegaly. No nodules. No JVD.  PULMONARY: Clear to ascultation,  without wheeze rails or rhonci. No use of accessory muscles, Good respiratory effort. good air entry bilaterally CHEST: Nontender to palpation.  CARDIOVASCULAR: S1 and S2. Regular rate and rhythm. No murmurs, rubs, or gallops. No edema. Pedal pulses 2+ bilaterally.  GASTROINTESTINAL: Soft, nontender, nondistended. No masses. Positive bowel sounds. No hepatosplenomegaly.  MUSCULOSKELETAL: No swelling, clubbing, or edema. Range of motion full in all extremities.  NEUROLOGIC: Cranial nerves II through XII are intact. No gross focal neurological deficits. Sensation intact. Reflexes intact.  SKIN: No ulceration, lesions, rashes, or cyanosis. Skin warm and dry. Turgor intact.  PSYCHIATRIC: Mood, affect within normal limits. The patient is awake, alert and oriented x 3. Insight, judgment intact.      LABORATORY PANEL:   CBC  Recent Labs Lab 01/23/16 0606  WBC 4.0  HGB 12.5*  HCT 36.7*  PLT 172   ------------------------------------------------------------------------------------------------------------------  Chemistries   Recent Labs Lab 01/21/16 0418  01/24/16 0511  01/25/16 0452  NA 136  < > 132*  --   --   K 3.9  < > 3.5  < > 3.4*  CL 96*  < > 98*  --   --   CO2 28  < > 29  --   --   GLUCOSE 158*  < > 220*  --   --   BUN <5*  < > 5*  --   --   CREATININE 0.78  < > 1.03  --   --   CALCIUM 8.7*  < > 8.5*  --   --   MG 2.0  --   --   --   --   < > =  values in this interval not displayed. ------------------------------------------------------------------------------------------------------------------  Cardiac Enzymes No results for input(s): TROPONINI in the last 168 hours. ------------------------------------------------------------------------------------------------------------------  RADIOLOGY:  No results found.  EKG:   Orders placed or performed during the hospital encounter of 01/17/16  . EKG 12-Lead  . EKG 12-Lead    ASSESSMENT AND PLAN:   Sergio [name]  is a 41 y.o. male presenting with Abdominal Pain . Admitted 01/17/2016 : Day #: 8 days   1. Hypertriglyceridemia induced pancreatitis insulin pump, full liquid diabetic diet advanced as tolerated  2 hypokalemia-being replaced aggressively, now normalized.   2. Urinary retention: flomax, urology consult, add proscar   3 obstructive sleep apnea-continue CPAP  4 depression-continue home medications- on trazodone, Zoloft and Xanax  5 DM1-  insulin pump, . Appreciate endocrinology input.  6 DVT prophylaxis-on Lovenox   All the records are reviewed and case discussed with Care Management/Social Workerr. Management plans discussed with the patient, family and they are in agreement.  CODE STATUS: full TOTAL TIME TAKING CARE OF THIS PATIENT: 33 minutes.   POSSIBLE D/C IN 2-3DAYS, DEPENDING ON CLINICAL CONDITION.   Hower,  [name].D on 01/25/2016 at 12:20 PM  Between 7am to 6pm - Pager - [phone]  After 6pm: House Pager: - [facility]  Office  [phone]  CC: Primary care physician; Crecencio Mc, MD

## 2016-01-26 ENCOUNTER — Telehealth: Payer: Self-pay | Admitting: Urology

## 2016-01-26 LAB — BASIC METABOLIC PANEL
ANION GAP: 7 (ref 5–15)
BUN: 11 mg/dL (ref 6–20)
CALCIUM: 9.2 mg/dL (ref 8.9–10.3)
CHLORIDE: 98 mmol/L — AB (ref 101–111)
CO2: 29 mmol/L (ref 22–32)
Creatinine, Ser: 0.95 mg/dL (ref 0.61–1.24)
GFR calc non Af Amer: >60 mL/min (ref >60)
Glucose, Bld: 104 mg/dL — ABNORMAL HIGH (ref 65–99)
POTASSIUM: 3.5 mmol/L (ref 3.5–5.1)
Sodium: 134 mmol/L — ABNORMAL LOW (ref 135–145)

## 2016-01-26 LAB — GLUCOSE, CAPILLARY
Glucose-Capillary: 149 mg/dL — ABNORMAL HIGH (ref 65–99)
Glucose-Capillary: 88 mg/dL (ref 65–99)
Glucose-Capillary: 113 mg/dL — ABNORMAL HIGH (ref 65–99)
Glucose-Capillary: 169 mg/dL — ABNORMAL HIGH (ref 65–99)
Glucose-Capillary: 121 mg/dL — ABNORMAL HIGH (ref 65–99)

## 2016-01-26 LAB — POTASSIUM

## 2016-01-26 LAB — MAGNESIUM: Magnesium: 1.9 mg/dL (ref 1.7–2.4)

## 2016-01-26 LAB — TRIGLYCERIDES: TRIGLYCERIDES: 873 mg/dL — AB (ref <150)

## 2016-01-26 LAB — PHOSPHORUS: PHOSPHORUS: 4.3 mg/dL (ref 2.5–4.6)

## 2016-01-26 MED ORDER — OXYCODONE HCL 5 MG PO TABS
10.0000 mg | ORAL_TABLET | ORAL | Status: DC | PRN
  Administered 2016-01-26 – 2016-01-27 (×5): 10 mg via ORAL
  Filled 2016-01-26 (×5): qty 2

## 2016-01-26 NOTE — Progress Notes (Signed)
Patient request pain medication upon arrival to room patient asleep. No acute distress noted.

## 2016-01-26 NOTE — Progress Notes (Signed)
[place] at [place] NAME: Sergio [name]    MRN#:  [id number]  DATE OF BIRTH:  [birth year]  SUBJECTIVE:  Hospital Day: 9 days Sergio [name] is a 40 y.o. male presenting with Abdominal Pain .   Overnight events: No overnight events Interval Events: Tolerate full liquid diet decreasing symptoms  REVIEW OF SYSTEMS:  CONSTITUTIONAL: No fever, fatigue or weakness.  EYES: No blurred or double vision.  EARS, NOSE, AND THROAT: No tinnitus or ear pain.  RESPIRATORY: No cough, shortness of breath, wheezing or hemoptysis.  CARDIOVASCULAR: No chest pain, orthopnea, edema.  GASTROINTESTINAL: No nausea, vomiting, diarrhea positive abdominal pain.  GENITOURINARY: No dysuria, hematuria.  ENDOCRINE: No polyuria, nocturia,  HEMATOLOGY: No anemia, easy bruising or bleeding SKIN: No rash or lesion. MUSCULOSKELETAL: No joint pain or arthritis.   NEUROLOGIC: No tingling, numbness, weakness.  PSYCHIATRY: No anxiety or depression.   DRUG ALLERGIES:   Allergies  Allergen Reactions  . Codeine Anaphylaxis  . Ivp Dye [Iodinated Diagnostic Agents] Other (See Comments)    Kidneys stop working    VITALS:  Blood pressure 105/70, pulse 68, temperature 96.8 F (36 C), temperature source Axillary, resp. rate 20, height 5\' 10"  (1.778 m), weight 111.041 kg (244 lb 12.8 oz), SpO2 93 %.  PHYSICAL EXAMINATION:  VITAL SIGNS: Filed Vitals:   01/26/16 0329 01/26/16 0755  BP: 118/72 105/70  Pulse: 66 68  Temp: 97.8 F (36.6 C) 96.8 F (36 C)  Resp: 20    GENERAL:40 y.o.male currently in no acute distress.  HEAD: Normocephalic, atraumatic.  EYES: Pupils equal, round, reactive to light. Extraocular muscles intact. No scleral icterus.  MOUTH: Moist mucosal membrane. Dentition intact. No abscess noted.  EAR, NOSE, THROAT: Clear without exudates. No external lesions.  NECK: Supple. No thyromegaly. No nodules. No JVD.  PULMONARY: Clear to ascultation, without  wheeze rails or rhonci. No use of accessory muscles, Good respiratory effort. good air entry bilaterally CHEST: Nontender to palpation.  CARDIOVASCULAR: S1 and S2. Regular rate and rhythm. No murmurs, rubs, or gallops. No edema. Pedal pulses 2+ bilaterally.  GASTROINTESTINAL: Soft, nontender, nondistended. No masses. Positive bowel sounds. No hepatosplenomegaly.  MUSCULOSKELETAL: No swelling, clubbing, or edema. Range of motion full in all extremities.  NEUROLOGIC: Cranial nerves II through XII are intact. No gross focal neurological deficits. Sensation intact. Reflexes intact.  SKIN: No ulceration, lesions, rashes, or cyanosis. Skin warm and dry. Turgor intact.  PSYCHIATRIC: Mood, affect within normal limits. The patient is awake, alert and oriented x 3. Insight, judgment intact.      LABORATORY PANEL:   CBC  Recent Labs Lab 01/23/16 0606  WBC 4.0  HGB 12.5*  HCT 36.7*  PLT 172   ------------------------------------------------------------------------------------------------------------------  Chemistries   Recent Labs Lab 01/26/16 0456  NA 134*  K 3.5  CL 98*  CO2 29  GLUCOSE 104*  BUN 11  CREATININE 0.95  CALCIUM 9.2  MG 1.9   ------------------------------------------------------------------------------------------------------------------  Cardiac Enzymes No results for input(s): TROPONINI in the last 168 hours. ------------------------------------------------------------------------------------------------------------------  RADIOLOGY:  No results found.  EKG:   Orders placed or performed during the hospital encounter of 01/17/16  . EKG 12-Lead  . EKG 12-Lead    ASSESSMENT AND PLAN:   Sergio [name] is a 40 y.o. male presenting with Abdominal Pain . Admitted 01/17/2016 : Day #: 9 days   1. Hypertriglyceridemia induced pancreatitis insulin pump, full liquid diabetic diet advanced as tolerated, transitioned pain medications to oral  2.  Urinary  retention: flomax, proscar continue Foley on discharge to follow-up with urology as outpatient  3 obstructive sleep apnea-continue CPAP  4 depression-continue home medications- on trazodone, Zoloft and Xanax  5 DM1-  insulin pump, . Appreciate endocrinology input.  6 DVT prophylaxis-on Lovenox   All the records are reviewed and case discussed with Care Management/Social Workerr. Management plans discussed with the patient, family and they are in agreement.  CODE STATUS: full TOTAL TIME TAKING CARE OF THIS PATIENT: 28 minutes.   POSSIBLE D/C IN 1DAYS, DEPENDING ON CLINICAL CONDITION.   Hedda Crumbley,  [name].D on 01/26/2016 at 2:04 PM  Between 7am to 6pm - Pager - [phone]  After 6pm: House Pager: - [facility]  Office  [phone]  CC: Primary care physician; Crecencio Mc, MD

## 2016-01-26 NOTE — Telephone Encounter (Signed)
Lm for pt to cb  He is a patient of dr. Lovina Reach

## 2016-01-26 NOTE — Progress Notes (Signed)
Endocrinology follow up  History of present illness Sergio [name] is a 40 y.o. male seen in follow up for acute pancreatitis due to severe hypertriglyceridemia. Over the weekend, Tg level decreased to <1000 and he transitioned from IV insulin to the insulin pump 2 days ago. Now on regular carb controlled diet. Reports abd pain now minimal and more a tenderness well controlled with PO narcotics. He has persistnet [place] retention and he failed a trial of removing the Foley catheter. Appetite improved. No N/V. Reviewed use of insulin pump. Pump settings: Basal rates 12 am 2.85 units/hr 10 am 2.6 units/hr 3 pm 2.6 units/hr                24-hr basal = 64.9 units NovoLog  Bolus settings I:C ratio 12 am4, 12 pm 3, 3 pm 2 Sensitivity 12 am 19, 4 pm 17 Target 100-110  He is checking sugars qACHS. He is entering carb intake. Sugars have ranged 88 - 149 over last 24 hrs. No hypoglycemia. Tolerating his diet.    Medical history Past Medical History  Diagnosis Date  . Recurrent acute pancreatitis     secondary to hypertriglyceridemia   . Gastric ulcer 2009  . Sleep apnea 1999    uses CPAP  . Chicken pox   . Diabetes mellitus   . HTN (hypertension)   . Familial hypertriglyceridemia     severe  . Morbid obesity ([place])     s/p bariatric sleeve surgery 01/2015  . Anxiety   . Depression      Surgical history Past Surgical History  Procedure Laterality Date  . Tonsillectomy  2003  . Vasectomy    . Lumbar disc surgery  2008  . Cholecystectomy    . Laparoscopic gastric sleeve resection       Medications . docusate sodium  100 mg Oral BID  . enoxaparin (LOVENOX) injection  40 mg Subcutaneous Q24H  . finasteride  5 mg Oral Daily  . gemfibrozil  600 mg Oral BID AC  . insulin pump   Subcutaneous TID AC, HS, 0200  . nystatin   Topical BID  . omega-3 acid ethyl esters  4 g Oral Daily  . pregabalin  300 mg Oral Daily  . rosuvastatin  20 mg Oral q1800  . sertraline  50 mg Oral Daily   . sodium chloride flush  3 mL Intravenous Q12H  . tamsulosin  0.4 mg Oral Daily  . traZODone  100 mg Oral QHS     Social history Social History  Substance Use Topics  . Smoking status: Never Smoker   . Smokeless tobacco: Never Used  . Alcohol Use: No     Comment: once month     Family history Family History  Problem Relation Age of Onset  . Adopted: Yes  . Hypertension Mother   . Hyperlipidemia Mother   . Hypertension Father   . Hyperlipidemia Father      Review of systems CV: no chest pain or palpitations PULM: no cough or shortness of breath GU: +urinary retention with Foley catheter in pace  Physical Exam BP 105/70 mmHg  Pulse 68  Temp(Src) 96.8 F (36 C) (Axillary)  Resp 20  Ht 5\' 10"  (1.778 m)  Wt 111.041 kg (244 lb 12.8 oz)  BMI 35.13 kg/m2  SpO2 93%  GEN: Obese white  male, in NAD. HEENT: No proptosis, EOMI, lid lag or stare. Oropharynx is clear.  NECK: supple, trachea midline.   RESPIRATORY: clear bilaterally, no wheeze, good  inspiratory effort. CV: No carotid bruits, RRR. ABD: soft, +tender in epigastric region. No guarding. +BS EXT: no peripheral edema SKIN: no dermatopathy or rash or acanthosis nigricans. No xanthomata. LYMPH: no submandibular or supraclavicular LAD PSYC: alert and oriented, good insight  Labs Results for orders placed or performed during the hospital encounter of 01/17/16 (from the past 24 hour(s))  Glucose, capillary     Status: Abnormal   Collection Time: 01/25/16  3:58 PM  Result Value Ref Range   Glucose-Capillary 132 (H) 65 - 99 mg/dL   Comment 1 Notify RN   Potassium     Status: None   Collection Time: 01/25/16  5:14 PM  Result Value Ref Range   Potassium 3.9 3.5 - 5.1 mmol/L  Glucose, capillary     Status: Abnormal   Collection Time: 01/25/16  9:46 PM  Result Value Ref Range   Glucose-Capillary 117 (H) 65 - 99 mg/dL   Comment 1 Notify RN   Glucose, capillary     Status: Abnormal   Collection Time: 01/26/16   3:27 AM  Result Value Ref Range   Glucose-Capillary 149 (H) 65 - 99 mg/dL   Comment 1 Notify RN   Basic metabolic panel     Status: Abnormal   Collection Time: 01/26/16  4:56 AM  Result Value Ref Range   Sodium 134 (L) 135 - 145 mmol/L   Potassium 3.5 3.5 - 5.1 mmol/L   Chloride 98 (L) 101 - 111 mmol/L   CO2 29 22 - 32 mmol/L   Glucose, Bld 104 (H) 65 - 99 mg/dL   BUN 11 6 - 20 mg/dL   Creatinine, Ser 0.95 0.61 - 1.24 mg/dL   Calcium 9.2 8.9 - 10.3 mg/dL   GFR calc non Af Amer >60 >60 mL/min   GFR calc Af Amer >60 >60 mL/min   Anion gap 7 5 - 15  Magnesium     Status: None   Collection Time: 01/26/16  4:56 AM  Result Value Ref Range   Magnesium 1.9 1.7 - 2.4 mg/dL  Phosphorus     Status: None   Collection Time: 01/26/16  4:56 AM  Result Value Ref Range   Phosphorus 4.3 2.5 - 4.6 mg/dL  Triglycerides     Status: Abnormal   Collection Time: 01/26/16  4:56 AM  Result Value Ref Range   Triglycerides 873 (H) <150 mg/dL  Glucose, capillary     Status: None   Collection Time: 01/26/16  8:53 AM  Result Value Ref Range   Glucose-Capillary 88 65 - 99 mg/dL   Comment 1 Notify RN   Glucose, capillary     Status: Abnormal   Collection Time: 01/26/16 11:23 AM  Result Value Ref Range   Glucose-Capillary 113 (H) 65 - 99 mg/dL   Comment 1 Notify RN     Lab Results  Component Value Date   HGBA1C 9.7* 01/16/2016     Assessment 1.Acute pancreatitis due to hypertriglyceridemia. 2. Familial hypertriglyceridemia 3. Type 2 diabetes  4. Type 2 diabetes with peripheral neuropathy 5. Obesity (BMI 35) 6.   Use of insulin pump  Plan 1. Continue with diabetes management by insulin pump. No need for change of pump settings. 2. Check sugars qACHS.  3. Continue carb control diet.  4. Continue fibrate + fish oil + statin. 5. Anticipate he will be discharged, per primary team, in next 24 hrs. He can then follow up with me in 1-2 weeks and will be called with that appt.

## 2016-01-26 NOTE — Progress Notes (Signed)
Potassium cancelled per Dr. Lavetta Nielsen.

## 2016-01-26 NOTE — Progress Notes (Signed)
MEDICATION RELATED CONSULT NOTE -Follow up  Pharmacy Consult for Insulin drip for hypertriglyceridemia; electrolyte management.    Allergies  Allergen Reactions  . Codeine Anaphylaxis  . Ivp Dye [Iodinated Diagnostic Agents] Other (See Comments)    Kidneys stop working    Patient Measurements: Height: 5\' 10"  (177.8 cm) Weight: 246 lb 6.4 oz (111.766 kg) IBW/kg (Calculated) : 73   Vital Signs: Temp: 97.8 F (36.6 C) (04/17 0329) Temp Source: Oral (04/17 0329) BP: 118/72 mmHg (04/17 0329) Pulse Rate: 66 (04/17 0329)  Lipid Panel     Component Value Date/Time   CHOL 969* 01/16/2016 2310   CHOL 357* 09/04/2014 0328   CHOL 1116* 04/27/2012 0939   TRIG 873* 01/26/2016 0456   TRIG 1201* 11/04/2014 0427   HDL 21* 01/16/2016 2310   HDL SEE COMMENT 09/04/2014 0328   HDL 60 04/27/2012 0939   CHOLHDL NOT REPORTED DUE TO HIGH TRIGLYCERIDES 01/16/2016 2310   CHOLHDL 18.6* 04/27/2012 0939   VLDL UNABLE TO CALCULATE IF TRIGLYCERIDE OVER 400 mg/dL 01/16/2016 2310   VLDL SEE COMMENT 09/04/2014 0328   LDLCALC UNABLE TO CALCULATE IF TRIGLYCERIDE OVER 400 mg/dL 01/16/2016 2310   LDLCALC SEE COMMENT 09/04/2014 0328   LDLCALC Comment 04/27/2012 0939   LDLDIRECT 111.4 06/30/2012 0950   Medical History: Past Medical History  Diagnosis Date  . Recurrent acute pancreatitis     secondary to hypertriglyceridemia   . Gastric ulcer 2009  . Sleep apnea 1999    uses CPAP  . Chicken pox   . Diabetes mellitus   . HTN (hypertension)   . Familial hypertriglyceridemia     severe  . Morbid obesity ([place])     s/p bariatric sleeve surgery 01/2015  . Anxiety   . Depression     Assessment: 40 yo male with chronic pancreatitis secondary to familial hypertriglyceridemia.  Pharmacy was consulted for initiation of insulin drip for hypertriglyceridemia.Patient is also receiving D10/0.45%NS with 46mEq of potassium at 125 mL/hr.   Goal of Therapy:  Transition from insulin drip to pump when TG <1000.   Blood Glucose 100-180.   Plan:  Triglycerides = 913.  Insulin drip has been transitioned to insulin pump as per Endocrinology. Continue D10/0.45%NS with 27mEq of potassium at 150 mL/hr.   K = 3.5.  No supplementation needed at this time.  Continue to monitor Potassium Q12hr. Will f/u with next level 4/15 at 17:00.    4/15 :  K @ 16:51 = 4.2 .  No supplementation needed at this time.  Will recheck K on 4/16 with AM labs.   04/16 AM K 3.4. Ordered potassium chloride 40 mEq po x 1 and will recheck electrolytes with AM labs. The order for IV fluids with potassium remains active.   04/17 AM K 3.5, phos 4.3, Mg 1.9. The order for IV fluids with potassium remains active. Will recheck electrolytes with AM labs.   Pharmacy will continue to monitor and adjust per consult.   Laural Benes, Pharm.D., BCPS Clinical Pharmacist  01/26/2016,6:15 AM

## 2016-01-26 NOTE — Progress Notes (Signed)
Offered to place pt on home CPAP but pt refused at this time. Encouraged pt to wear CPAP and he will call if he decides he needs help. Home CPAP is plugged into a red outlet and has no signs of wear or damage that is obvious.

## 2016-01-27 LAB — GLUCOSE, CAPILLARY
Glucose-Capillary: 163 mg/dL — ABNORMAL HIGH (ref 65–99)
Glucose-Capillary: 110 mg/dL — ABNORMAL HIGH (ref 65–99)
Glucose-Capillary: 105 mg/dL — ABNORMAL HIGH (ref 65–99)
Glucose-Capillary: 136 mg/dL — ABNORMAL HIGH (ref 65–99)

## 2016-01-27 LAB — BASIC METABOLIC PANEL
Anion gap: UNDETERMINED (ref 5–15)
BUN: 15 mg/dL (ref 6–20)
CALCIUM: 8.8 mg/dL — AB (ref 8.9–10.3)
CO2: 25 mmol/L (ref 22–32)
CREATININE: 0.8 mg/dL (ref 0.61–1.24)
Chloride: 100 mmol/L — ABNORMAL LOW (ref 101–111)
GLUCOSE: 105 mg/dL — AB (ref 65–99)
Potassium: 3.7 mmol/L (ref 3.5–5.1)
Sodium: UNDETERMINED mmol/L (ref 135–145)

## 2016-01-27 LAB — TRIGLYCERIDES: Triglycerides: 1569 mg/dL — ABNORMAL HIGH (ref <150)

## 2016-01-27 MED ORDER — SODIUM CHLORIDE 0.9 % IV SOLN: 0.0500 [IU]/kg/h | INTRAVENOUS | Status: DC

## 2016-01-27 MED ORDER — OXYCODONE HCL 10 MG PO TABS
10.0000 mg | ORAL_TABLET | Freq: Four times a day (QID) | ORAL | Status: DC | PRN
Start: 2016-01-27 — End: 2016-03-19

## 2016-01-27 MED ORDER — FINASTERIDE 5 MG PO TABS: 5.0000 mg | ORAL_TABLET | Freq: Every day | ORAL | Status: DC

## 2016-01-27 MED ORDER — TAMSULOSIN HCL 0.4 MG PO CAPS: 0.4000 mg | ORAL_CAPSULE | Freq: Every day | ORAL | Status: DC

## 2016-01-27 NOTE — Progress Notes (Signed)
Dr Anselm Jungling notified rn that pt  Requests to be discharged home  Because triglicerides are rising but not as high as his norm. [name] to eval pt at lunch. md reports hold transfer

## 2016-01-27 NOTE — Progress Notes (Signed)
Sodium cancelled per Dr. Anselm Jungling.

## 2016-01-27 NOTE — Progress Notes (Signed)
MEDICATION RELATED CONSULT NOTE -Follow up  Pharmacy Consult for Insulin drip for electrolyte management.    Allergies  Allergen Reactions  . Codeine Anaphylaxis  . Ivp Dye [Iodinated Diagnostic Agents] Other (See Comments)    Kidneys stop working    Patient Measurements: Height: 5\' 10"  (177.8 cm) Weight: 244 lb 9.8 oz (110.954 kg) IBW/kg (Calculated) : 73   Vital Signs: Temp: 98.1 F (36.7 C) (04/18 0812) Temp Source: Oral (04/18 0812) BP: 102/75 mmHg (04/18 0812) Pulse Rate: 71 (04/18 0812)  Lipid Panel     Component Value Date/Time   CHOL 969* 01/16/2016 2310   CHOL 357* 09/04/2014 0328   CHOL 1116* 04/27/2012 0939   TRIG 1569* 01/27/2016 0614   TRIG 1201* 11/04/2014 0427   HDL 21* 01/16/2016 2310   HDL SEE COMMENT 09/04/2014 0328   HDL 60 04/27/2012 0939   CHOLHDL NOT REPORTED DUE TO HIGH TRIGLYCERIDES 01/16/2016 2310   CHOLHDL 18.6* 04/27/2012 0939   VLDL UNABLE TO CALCULATE IF TRIGLYCERIDE OVER 400 mg/dL 01/16/2016 2310   VLDL SEE COMMENT 09/04/2014 0328   LDLCALC UNABLE TO CALCULATE IF TRIGLYCERIDE OVER 400 mg/dL 01/16/2016 2310   LDLCALC SEE COMMENT 09/04/2014 0328   LDLCALC Comment 04/27/2012 0939   LDLDIRECT 111.4 06/30/2012 0950   Medical History: Past Medical History  Diagnosis Date  . Recurrent acute pancreatitis     secondary to hypertriglyceridemia   . Gastric ulcer 2009  . Sleep apnea 1999    uses CPAP  . Chicken pox   . Diabetes mellitus   . HTN (hypertension)   . Familial hypertriglyceridemia     severe  . Morbid obesity ([place])     s/p bariatric sleeve surgery 01/2015  . Anxiety   . Depression     Assessment: 40 yo male with chronic pancreatitis secondary to familial hypertriglyceridemia.    Plan:  No replacement warranted at this time. Will obtain follow-up electrolytes with am labs.  Pharmacy will continue to monitor and adjust per consult.   Simpson,Michael L,   01/27/2016,11:35 AM

## 2016-01-27 NOTE — Progress Notes (Signed)
Endocrinology follow up  History of present illness Sergio [name] is a 40 y.o. male seen in follow up for acute pancreatitis due to severe hypertriglyceridemia. Continues to use insulin pump. Sugars over last 24 hrs have been in the 105 - 163 range. No hypoglycemia. He has persistent urinary retention and requires Foley catheter. Appetite improved. No N/V. Abd discomfort improved. Tolerating his diet. Tg level inc'd to 1569.   Medical history Past Medical History  Diagnosis Date  . Recurrent acute pancreatitis     secondary to hypertriglyceridemia   . Gastric ulcer 2009  . Sleep apnea 1999    uses CPAP  . Chicken pox   . Diabetes mellitus   . HTN (hypertension)   . Familial hypertriglyceridemia     severe  . Morbid obesity ([place])     s/p bariatric sleeve surgery 01/2015  . Anxiety   . Depression      Surgical history Past Surgical History  Procedure Laterality Date  . Tonsillectomy  2003  . Vasectomy    . Lumbar disc surgery  2008  . Cholecystectomy    . Laparoscopic gastric sleeve resection       Medications . docusate sodium  100 mg Oral BID  . enoxaparin (LOVENOX) injection  40 mg Subcutaneous Q24H  . finasteride  5 mg Oral Daily  . gemfibrozil  600 mg Oral BID AC  . insulin pump   Subcutaneous TID AC, HS, 0200  . nystatin   Topical BID  . omega-3 acid ethyl esters  4 g Oral Daily  . pregabalin  300 mg Oral Daily  . rosuvastatin  20 mg Oral q1800  . sertraline  50 mg Oral Daily  . sodium chloride flush  3 mL Intravenous Q12H  . tamsulosin  0.4 mg Oral Daily  . traZODone  100 mg Oral QHS     Social history Social History  Substance Use Topics  . Smoking status: Never Smoker   . Smokeless tobacco: Never Used  . Alcohol Use: No     Comment: once month     Family history Family History  Problem Relation Age of Onset  . Adopted: Yes  . Hypertension Mother   . Hyperlipidemia Mother   . Hypertension Father   . Hyperlipidemia Father      Review of  systems CV: no chest pain or palpitations PULM: no cough or shortness of breath GU: +urinary retention with Foley catheter in pace  Physical Exam BP 102/75 mmHg  Pulse 71  Temp(Src) 98.1 F (36.7 C) (Oral)  Resp 14  Ht 5\' 10"  (1.778 m)  Wt 110.954 kg (244 lb 9.8 oz)  BMI 35.10 kg/m2  SpO2 95%  GEN: Obese white  male, in NAD.   Labs Results for orders placed or performed during the hospital encounter of 01/17/16 (from the past 24 hour(s))  Glucose, capillary     Status: Abnormal   Collection Time: 01/26/16  4:06 PM  Result Value Ref Range   Glucose-Capillary 169 (H) 65 - 99 mg/dL   Comment 1 Notify RN   Potassium     Status: None   Collection Time: 01/26/16  4:50 PM  Result Value Ref Range   Potassium NOT VALID 3.5 - 5.1 mmol/L  Glucose, capillary     Status: Abnormal   Collection Time: 01/26/16  9:24 PM  Result Value Ref Range   Glucose-Capillary 121 (H) 65 - 99 mg/dL  Glucose, capillary     Status: Abnormal   Collection  Time: 01/27/16  2:07 AM  Result Value Ref Range   Glucose-Capillary 163 (H) 65 - 99 mg/dL  Basic metabolic panel     Status: Abnormal   Collection Time: 01/27/16  6:14 AM  Result Value Ref Range   Sodium UNABLE TO REPORT DUE TO LIPEMIC INTERFERENCE 135 - 145 mmol/L   Potassium 3.7 3.5 - 5.1 mmol/L   Chloride 100 (L) 101 - 111 mmol/L   CO2 25 22 - 32 mmol/L   Glucose, Bld 105 (H) 65 - 99 mg/dL   BUN 15 6 - 20 mg/dL   Creatinine, Ser 0.80 0.61 - 1.24 mg/dL   Calcium 8.8 (L) 8.9 - 10.3 mg/dL   GFR calc non Af Amer >60 >60 mL/min   GFR calc Af Amer >60 >60 mL/min   Anion gap UNABLE TO REPORT DUE TO LIPEMIC INTERFERENCE 5 - 15  Triglycerides     Status: Abnormal   Collection Time: 01/27/16  6:14 AM  Result Value Ref Range   Triglycerides 1569 (H) <150 mg/dL  Glucose, capillary     Status: Abnormal   Collection Time: 01/27/16  6:18 AM  Result Value Ref Range   Glucose-Capillary 110 (H) 65 - 99 mg/dL  Glucose, capillary     Status: Abnormal    Collection Time: 01/27/16  7:36 AM  Result Value Ref Range   Glucose-Capillary 105 (H) 65 - 99 mg/dL   Comment 1 Notify RN   Glucose, capillary     Status: Abnormal   Collection Time: 01/27/16 11:13 AM  Result Value Ref Range   Glucose-Capillary 136 (H) 65 - 99 mg/dL   Comment 1 Notify RN     Lab Results  Component Value Date   HGBA1C 9.7* 01/16/2016     Assessment 1.Acute pancreatitis due to hypertriglyceridemia. 2. Familial hypertriglyceridemia 3. Type 2 diabetes  4. Type 2 diabetes with peripheral neuropathy 5. Obesity (BMI 35) 6.   Use of insulin pump  Plan Clinically improved. Abd pain better each day and tolerating diet. Primary team may consider discharge with PO narcotics to use over next week PRN. While triglycerides are higher, this is not unexpected. Despite his low fat diet, insulin pump use, fibrate, statin, and fish oil, his triglycerides have been chronically elevated. At baseline, it is typical for his triglycerides to be >3000. As long as he does not have worsening of abd pain indicating return of his acute pancreatitis, there is nothing additional that should be changed.   Will arrange for out-patient clinic follow up in 1-2 weeks.

## 2016-01-27 NOTE — Progress Notes (Signed)
[place], [place].   01/27/2016  Patient: Sergio [name]   Date of Birth:  1976/04/15  Date of admission:  01/17/2016  Date of Discharge  01/27/2016    To Whom it May Concern:   Kahseem Anastasia  may return to work on 01/28/16.  PHYSICAL ACTIVITY:  Full without any restrictions.  If you have any questions or concerns, please don't hesitate to call.  Sincerely,   Vaughan Basta M.D Pager [phone] Office : [phone]   .

## 2016-01-30 NOTE — Discharge Summary (Signed)
[facility] NAME: Joncarlo Tucciarone    MR#:  [id number]  DATE OF BIRTH:  1976-09-13  DATE OF ADMISSION:  01/17/2016 ADMITTING PHYSICIAN: Harrie Foreman, MD  DATE OF DISCHARGE: 01/27/2016  2:47 PM  PRIMARY CARE PHYSICIAN: Crecencio Mc, MD    ADMISSION DIAGNOSIS:  Hyponatremia [E87.1] Other chronic pancreatitis ([place]) [K86.1]  DISCHARGE DIAGNOSIS:  Active Problems:   Abdominal pain   Hypertriglyceridemia    Acute pancreatitis  SECONDARY DIAGNOSIS:   Past Medical History  Diagnosis Date  . Recurrent acute pancreatitis     secondary to hypertriglyceridemia   . Gastric ulcer 2009  . Sleep apnea 1999    uses CPAP  . Chicken pox   . Diabetes mellitus   . HTN (hypertension)   . Familial hypertriglyceridemia     severe  . Morbid obesity ([place])     s/p bariatric sleeve surgery 01/2015  . Anxiety   . Depression     HOSPITAL COURSE:   1. Hypertriglyceridemia induced pancreatitis   Initially give Insulin drip-  resumed insulin pump, full liquid diabetic diet advanced as tolerated, transitioned pain medications to oral  2. Urinary retention: flomax, proscar continue Foley on discharge to follow-up with urology as outpatient  3 obstructive sleep apnea-continue CPAP  4 depression-continue home medications- on trazodone, Zoloft and Xanax  5 DM1- insulin pump, . Appreciate endocrinology input.  6 DVT prophylaxis-on Lovenox  DISCHARGE CONDITIONS:   Stable.  CONSULTS OBTAINED:  Treatment Team:  Lytle Butte, MD Irine Seal, MD  DRUG ALLERGIES:   Allergies  Allergen Reactions  . Codeine Anaphylaxis  . Ivp Dye [Iodinated Diagnostic Agents] Other (See Comments)    Kidneys stop working    DISCHARGE MEDICATIONS:   Discharge Medication List as of 01/27/2016  2:16 PM    START taking these medications   Details  finasteride (PROSCAR) 5 MG tablet Take 1 tablet (5 mg total) by mouth daily., Starting 01/27/2016,  Until Discontinued, Print    oxyCODONE 10 MG TABS Take 1 tablet (10 mg total) by mouth every 6 (six) hours as needed for moderate pain or severe pain., Starting 01/27/2016, Until Discontinued, Print    tamsulosin (FLOMAX) 0.4 MG CAPS capsule Take 1 capsule (0.4 mg total) by mouth daily., Starting 01/27/2016, Until Discontinued, Print      CONTINUE these medications which have NOT CHANGED   Details  ALPRAZolam (XANAX) 0.5 MG tablet Take 1 tablet (0.5 mg total) by mouth 2 (two) times daily as needed for anxiety., Starting 12/12/2015, Until Discontinued, Print    gemfibrozil (LOPID) 600 MG tablet Take 1 tablet (600 mg total) by mouth 2 (two) times daily before a meal., Starting 08/15/2015, Until Discontinued, Print    insulin aspart (NOVOLOG) 100 UNIT/ML injection Inject 1 Dose into the skin See admin instructions. Use up to 120 units daily via insulin pump, Starting 09/11/2015, Until Discontinued, Historical Med    omega-3 acid ethyl esters (LOVAZA) 1 g capsule Take 4 g by mouth at bedtime., Until Discontinued, Historical Med    pregabalin (LYRICA) 300 MG capsule Take 300 mg by mouth at bedtime. , Until Discontinued, Historical Med    rosuvastatin (CRESTOR) 10 MG tablet Take 1 tablet (10 mg total) by mouth daily at 6 PM., Starting 08/15/2015, Until Discontinued, Print    sertraline (ZOLOFT) 50 MG tablet Take 50 mg by mouth daily., Until Discontinued, Historical Med    traZODone (DESYREL) 100 MG tablet Take 100 mg  by mouth at bedtime., Until Discontinued, Historical Med         DISCHARGE INSTRUCTIONS:    Follow  With urology and Endocrinology clinic in 2 weeks.  If you experience worsening of your admission symptoms, develop shortness of breath, life threatening emergency, suicidal or homicidal thoughts you must seek medical attention immediately by calling 911 or calling your MD immediately  if symptoms less severe.  You Must read complete instructions/literature along with all the  possible adverse reactions/side effects for all the Medicines you take and that have been prescribed to you. Take any new Medicines after you have completely understood and accept all the possible adverse reactions/side effects.   Please note  You were cared for by a hospitalist during your hospital stay. If you have any questions about your discharge medications or the care you received while you were in the hospital after you are discharged, you can call the unit and asked to speak with the hospitalist on call if the hospitalist that took care of you is not available. Once you are discharged, your primary care physician will handle any further medical issues. Please note that NO REFILLS for any discharge medications will be authorized once you are discharged, as it is imperative that you return to your primary care physician (or establish a relationship with a primary care physician if you do not have one) for your aftercare needs so that they can reassess your need for medications and monitor your lab values.    Today   CHIEF COMPLAINT:   Chief Complaint  Patient presents with  . Abdominal Pain    HISTORY OF PRESENT ILLNESS:  Neshawn Sieve  is a 40 y.o. male with a known history of chronic pancreatitis secondary to familial hypertriglyceridemia presents emergency department complaining of upper epigastric pain. He states that it began approximately 6-8 hours prior to presentation. He denies vomiting but admits to nausea. He also denies fever, shortness of breath or chest pain. In the emergency department the patient was found to have a normal lipase but profound hyponatremia. Due to his abdominal pain is well as low-sodium emergency [facility] staff called for admission.   VITAL SIGNS:  Blood pressure 102/75, pulse 71, temperature 98.1 F (36.7 C), temperature source Oral, resp. rate 14, height 5\' 10"  (1.778 m), weight 110.954 kg (244 lb 9.8 oz), SpO2 95 %.  I/O:  No intake or output data in  the 24 hours ending 01/30/16 2159  PHYSICAL EXAMINATION:  GENERAL:  40 y.o.-year-old patient lying in the bed with no acute distress.  EYES: Pupils equal, round, reactive to light and accommodation. No scleral icterus. Extraocular muscles intact.  HEENT: Head atraumatic, normocephalic. Oropharynx and nasopharynx clear.  NECK:  Supple, no jugular venous distention. No thyroid enlargement, no tenderness.  LUNGS: Normal breath sounds bilaterally, no wheezing, rales,rhonchi or crepitation. No use of accessory muscles of respiration.  CARDIOVASCULAR: S1, S2 normal. No murmurs, rubs, or gallops.  ABDOMEN: Soft, non-tender, non-distended. Bowel sounds present. No organomegaly or mass.  EXTREMITIES: No pedal edema, cyanosis, or clubbing.  NEUROLOGIC: Cranial nerves II through XII are intact. Muscle strength 5/5 in all extremities. Sensation intact. Gait not checked.  PSYCHIATRIC: The patient is alert and oriented x 3.  SKIN: No obvious rash, lesion, or ulcer.   DATA REVIEW:   CBC No results for input(s): WBC, HGB, HCT, PLT in the last 168 hours.  Chemistries   Recent Labs Lab 01/26/16 0456  01/27/16 0614  NA 134*  --  UNABLE TO REPORT DUE TO LIPEMIC INTERFERENCE  K 3.5  < > 3.7  CL 98*  --  100*  CO2 29  --  25  GLUCOSE 104*  --  105*  BUN 11  --  15  CREATININE 0.95  --  0.80  CALCIUM 9.2  --  8.8*  MG 1.9  --   --   < > = values in this interval not displayed.  Cardiac Enzymes No results for input(s): TROPONINI in the last 168 hours.  Microbiology Results  Results for orders placed or performed during the hospital encounter of 01/17/16  MRSA PCR Screening     Status: None   Collection Time: 01/17/16  8:03 AM  Result Value Ref Range Status   MRSA by PCR NEGATIVE NEGATIVE Final    Comment:        The GeneXpert MRSA Assay (FDA approved for NASAL specimens only), is one component of a comprehensive MRSA colonization surveillance program. It is not intended to diagnose  MRSA infection nor to guide or monitor treatment for MRSA infections.     RADIOLOGY:  No results found.  EKG:   Orders placed or performed during the hospital encounter of 01/17/16  . EKG 12-Lead  . EKG 12-Lead  . EKG      Management plans discussed with the patient, family and they are in agreement.  CODE STATUS:  Code Status History    Date Active Date Inactive Code Status Order ID Comments User Context   01/17/2016  2:50 AM 01/27/2016  5:47 PM Full Code [id number]  Harrie Foreman, MD Inpatient   08/01/2015 11:35 PM 08/15/2015  4:43 PM Full Code [id number]  Lytle Butte, MD ED   07/21/2014 11:46 AM 07/27/2014  6:16 PM Full Code [id number]  Oswald Hillock, MD Inpatient      TOTAL TIME TAKING CARE OF THIS PATIENT: 35 minutes.    Vaughan Basta M.D on 01/30/2016 at 9:59 PM  Between 7am to 6pm - Pager - [phone]  After 6pm go to [email or web address] - password [email or web address] [facility]  Office  [phone]  CC: Primary care physician; Crecencio Mc, MD   Note: This dictation was prepared with Dragon dictation along with smaller phrase technology. Any transcriptional errors that result from this process are unintentional.

## 2016-02-13 ENCOUNTER — Encounter: Payer: Self-pay | Admitting: Internal Medicine

## 2016-02-27 ENCOUNTER — Encounter: Payer: Self-pay | Admitting: Internal Medicine

## 2016-03-10 DIAGNOSIS — Z794 Long term (current) use of insulin: Secondary | ICD-10-CM

## 2016-03-10 DIAGNOSIS — F411 Generalized anxiety disorder: Secondary | ICD-10-CM | POA: Diagnosis present

## 2016-03-10 DIAGNOSIS — Z9641 Presence of insulin pump (external) (internal): Secondary | ICD-10-CM | POA: Diagnosis present

## 2016-03-10 DIAGNOSIS — E876 Hypokalemia: Secondary | ICD-10-CM | POA: Diagnosis present

## 2016-03-10 DIAGNOSIS — Z6835 Body mass index (BMI) 35.0-35.9, adult: Secondary | ICD-10-CM

## 2016-03-10 DIAGNOSIS — Z79899 Other long term (current) drug therapy: Secondary | ICD-10-CM

## 2016-03-10 DIAGNOSIS — K858 Other acute pancreatitis without necrosis or infection: Secondary | ICD-10-CM | POA: Diagnosis not present

## 2016-03-10 DIAGNOSIS — E11649 Type 2 diabetes mellitus with hypoglycemia without coma: Secondary | ICD-10-CM | POA: Diagnosis present

## 2016-03-10 DIAGNOSIS — I10 Essential (primary) hypertension: Secondary | ICD-10-CM | POA: Diagnosis present

## 2016-03-10 DIAGNOSIS — K859 Acute pancreatitis without necrosis or infection, unspecified: Secondary | ICD-10-CM | POA: Diagnosis not present

## 2016-03-10 DIAGNOSIS — E871 Hypo-osmolality and hyponatremia: Secondary | ICD-10-CM | POA: Diagnosis present

## 2016-03-10 DIAGNOSIS — E1140 Type 2 diabetes mellitus with diabetic neuropathy, unspecified: Secondary | ICD-10-CM | POA: Diagnosis present

## 2016-03-10 DIAGNOSIS — Z91041 Radiographic dye allergy status: Secondary | ICD-10-CM

## 2016-03-10 DIAGNOSIS — E781 Pure hyperglyceridemia: Secondary | ICD-10-CM | POA: Diagnosis present

## 2016-03-10 DIAGNOSIS — Z79891 Long term (current) use of opiate analgesic: Secondary | ICD-10-CM

## 2016-03-10 DIAGNOSIS — Z8249 Family history of ischemic heart disease and other diseases of the circulatory system: Secondary | ICD-10-CM

## 2016-03-10 DIAGNOSIS — K861 Other chronic pancreatitis: Secondary | ICD-10-CM | POA: Diagnosis present

## 2016-03-10 DIAGNOSIS — N400 Enlarged prostate without lower urinary tract symptoms: Secondary | ICD-10-CM | POA: Diagnosis present

## 2016-03-10 DIAGNOSIS — F331 Major depressive disorder, recurrent, moderate: Secondary | ICD-10-CM | POA: Diagnosis present

## 2016-03-10 DIAGNOSIS — Z885 Allergy status to narcotic agent status: Secondary | ICD-10-CM

## 2016-03-10 DIAGNOSIS — R339 Retention of urine, unspecified: Secondary | ICD-10-CM | POA: Diagnosis present

## 2016-03-10 DIAGNOSIS — E7800 Pure hypercholesterolemia, unspecified: Secondary | ICD-10-CM | POA: Diagnosis present

## 2016-03-10 DIAGNOSIS — G4733 Obstructive sleep apnea (adult) (pediatric): Secondary | ICD-10-CM | POA: Diagnosis present

## 2016-03-10 DIAGNOSIS — Z9884 Bariatric surgery status: Secondary | ICD-10-CM

## 2016-03-10 NOTE — ED Notes (Signed)
Pt unable to given urine specimen at this time. Given specimen cup and instructed to bring to front desk when done.

## 2016-03-10 NOTE — ED Notes (Signed)
Patient ambulatory to triage with steady gait, without difficulty or distress noted; pt reports mid upper abd pain radiating thru to back since yesterday accomp by nausea; st hx of pancreatitis

## 2016-03-11 ENCOUNTER — Inpatient Hospital Stay
Admission: EM | Admit: 2016-03-11 | Discharge: 2016-03-19 | DRG: 439 | Disposition: A | Discharge status: Home / Self Care | Payer: BLUE CROSS/BLUE SHIELD | Attending: Internal Medicine | Admitting: Internal Medicine

## 2016-03-11 ENCOUNTER — Encounter: Payer: Self-pay | Admitting: *Deleted

## 2016-03-11 DIAGNOSIS — Z6835 Body mass index (BMI) 35.0-35.9, adult: Secondary | ICD-10-CM | POA: Diagnosis not present

## 2016-03-11 DIAGNOSIS — Z9884 Bariatric surgery status: Secondary | ICD-10-CM | POA: Diagnosis not present

## 2016-03-11 DIAGNOSIS — Z79899 Other long term (current) drug therapy: Secondary | ICD-10-CM | POA: Diagnosis not present

## 2016-03-11 DIAGNOSIS — Z885 Allergy status to narcotic agent status: Secondary | ICD-10-CM | POA: Diagnosis not present

## 2016-03-11 DIAGNOSIS — F411 Generalized anxiety disorder: Secondary | ICD-10-CM | POA: Diagnosis present

## 2016-03-11 DIAGNOSIS — R339 Retention of urine, unspecified: Secondary | ICD-10-CM | POA: Diagnosis present

## 2016-03-11 DIAGNOSIS — E11649 Type 2 diabetes mellitus with hypoglycemia without coma: Secondary | ICD-10-CM | POA: Diagnosis present

## 2016-03-11 DIAGNOSIS — Z794 Long term (current) use of insulin: Secondary | ICD-10-CM | POA: Diagnosis not present

## 2016-03-11 DIAGNOSIS — E876 Hypokalemia: Secondary | ICD-10-CM | POA: Diagnosis present

## 2016-03-11 DIAGNOSIS — F419 Anxiety disorder, unspecified: Secondary | ICD-10-CM | POA: Diagnosis present

## 2016-03-11 DIAGNOSIS — I10 Essential (primary) hypertension: Secondary | ICD-10-CM | POA: Diagnosis present

## 2016-03-11 DIAGNOSIS — K858 Other acute pancreatitis without necrosis or infection: Secondary | ICD-10-CM

## 2016-03-11 DIAGNOSIS — E871 Hypo-osmolality and hyponatremia: Secondary | ICD-10-CM | POA: Diagnosis present

## 2016-03-11 DIAGNOSIS — G4733 Obstructive sleep apnea (adult) (pediatric): Secondary | ICD-10-CM | POA: Diagnosis present

## 2016-03-11 DIAGNOSIS — E7800 Pure hypercholesterolemia, unspecified: Secondary | ICD-10-CM | POA: Diagnosis present

## 2016-03-11 DIAGNOSIS — K859 Acute pancreatitis without necrosis or infection, unspecified: Secondary | ICD-10-CM | POA: Diagnosis present

## 2016-03-11 DIAGNOSIS — Z91041 Radiographic dye allergy status: Secondary | ICD-10-CM | POA: Diagnosis not present

## 2016-03-11 DIAGNOSIS — E1140 Type 2 diabetes mellitus with diabetic neuropathy, unspecified: Secondary | ICD-10-CM | POA: Diagnosis present

## 2016-03-11 DIAGNOSIS — Z8249 Family history of ischemic heart disease and other diseases of the circulatory system: Secondary | ICD-10-CM | POA: Diagnosis not present

## 2016-03-11 DIAGNOSIS — F331 Major depressive disorder, recurrent, moderate: Secondary | ICD-10-CM | POA: Diagnosis present

## 2016-03-11 DIAGNOSIS — K861 Other chronic pancreatitis: Secondary | ICD-10-CM | POA: Diagnosis present

## 2016-03-11 DIAGNOSIS — E781 Pure hyperglyceridemia: Secondary | ICD-10-CM | POA: Diagnosis present

## 2016-03-11 DIAGNOSIS — Z79891 Long term (current) use of opiate analgesic: Secondary | ICD-10-CM | POA: Diagnosis not present

## 2016-03-11 DIAGNOSIS — N400 Enlarged prostate without lower urinary tract symptoms: Secondary | ICD-10-CM | POA: Diagnosis present

## 2016-03-11 DIAGNOSIS — Z9641 Presence of insulin pump (external) (internal): Secondary | ICD-10-CM | POA: Diagnosis present

## 2016-03-11 LAB — COMPREHENSIVE METABOLIC PANEL
ALK PHOS: 82 U/L (ref 38–126)
ALT: 51 U/L (ref 17–63)
ANION GAP: 10 (ref 5–15)
AST: 67 U/L — ABNORMAL HIGH (ref 15–41)
Albumin: 4.8 g/dL (ref 3.5–5.0)
BUN: 10 mg/dL (ref 6–20)
CALCIUM: 9 mg/dL (ref 8.9–10.3)
CO2: 24 mmol/L (ref 22–32)
Chloride: 93 mmol/L — ABNORMAL LOW (ref 101–111)
Creatinine, Ser: 0.5 mg/dL — ABNORMAL LOW (ref 0.61–1.24)
Glucose, Bld: 188 mg/dL — ABNORMAL HIGH (ref 65–99)
Potassium: 3.1 mmol/L — ABNORMAL LOW (ref 3.5–5.1)
SODIUM: 127 mmol/L — AB (ref 135–145)
TOTAL PROTEIN: 6.3 g/dL — AB (ref 6.5–8.1)
Total Bilirubin: <0.1 mg/dL — ABNORMAL LOW (ref 0.3–1.2)

## 2016-03-11 LAB — URINALYSIS COMPLETE WITH MICROSCOPIC (ARMC ONLY)
Bacteria, UA: NONE SEEN
Bilirubin Urine: NEGATIVE
Glucose, UA: 50 mg/dL — AB
Hgb urine dipstick: NEGATIVE
Leukocytes, UA: NEGATIVE
Nitrite: NEGATIVE
Protein, ur: 100 mg/dL — AB
Specific Gravity, Urine: 1.026 (ref 1.005–1.030)
pH: 5 (ref 5.0–8.0)

## 2016-03-11 LAB — LIPID PANEL
Cholesterol: 899 mg/dL — ABNORMAL HIGH (ref 0–200)
HDL: 16 mg/dL — ABNORMAL LOW (ref >40)
LDL Cholesterol: UNDETERMINED mg/dL (ref 0–99)
Total CHOL/HDL Ratio: 56.2 RATIO
Triglycerides: >5000 mg/dL — ABNORMAL HIGH (ref <150)
VLDL: UNDETERMINED mg/dL (ref 0–40)

## 2016-03-11 LAB — HEMOGLOBIN A1C: Hgb A1c MFr Bld: 8.6 % — ABNORMAL HIGH (ref 4.0–6.0)

## 2016-03-11 LAB — TSH: TSH: 6.098 u[IU]/mL — ABNORMAL HIGH (ref 0.350–4.500)

## 2016-03-11 LAB — GLUCOSE, CAPILLARY
Glucose-Capillary: 198 mg/dL — ABNORMAL HIGH (ref 65–99)
Glucose-Capillary: 202 mg/dL — ABNORMAL HIGH (ref 65–99)
Glucose-Capillary: 207 mg/dL — ABNORMAL HIGH (ref 65–99)
Glucose-Capillary: 175 mg/dL — ABNORMAL HIGH (ref 65–99)
Glucose-Capillary: 152 mg/dL — ABNORMAL HIGH (ref 65–99)
Glucose-Capillary: 128 mg/dL — ABNORMAL HIGH (ref 65–99)
Glucose-Capillary: 125 mg/dL — ABNORMAL HIGH (ref 65–99)
Glucose-Capillary: 180 mg/dL — ABNORMAL HIGH (ref 65–99)
Glucose-Capillary: 179 mg/dL — ABNORMAL HIGH (ref 65–99)
Glucose-Capillary: 187 mg/dL — ABNORMAL HIGH (ref 65–99)
Glucose-Capillary: 222 mg/dL — ABNORMAL HIGH (ref 65–99)
Glucose-Capillary: 224 mg/dL — ABNORMAL HIGH (ref 65–99)
Glucose-Capillary: 211 mg/dL — ABNORMAL HIGH (ref 65–99)
Glucose-Capillary: 209 mg/dL — ABNORMAL HIGH (ref 65–99)
Glucose-Capillary: 189 mg/dL — ABNORMAL HIGH (ref 65–99)
Glucose-Capillary: 203 mg/dL — ABNORMAL HIGH (ref 65–99)
Glucose-Capillary: 198 mg/dL — ABNORMAL HIGH (ref 65–99)

## 2016-03-11 LAB — BASIC METABOLIC PANEL
ANION GAP: 9 (ref 5–15)
BUN: 8 mg/dL (ref 6–20)
CALCIUM: 8 mg/dL — AB (ref 8.9–10.3)
CO2: 24 mmol/L (ref 22–32)
CREATININE: 0.5 mg/dL — AB (ref 0.61–1.24)
Chloride: 95 mmol/L — ABNORMAL LOW (ref 101–111)
GFR calc Af Amer: >60 mL/min (ref >60)
GFR calc non Af Amer: >60 mL/min (ref >60)
GLUCOSE: 172 mg/dL — AB (ref 65–99)
Potassium: 3.5 mmol/L (ref 3.5–5.1)
Sodium: 128 mmol/L — ABNORMAL LOW (ref 135–145)

## 2016-03-11 LAB — CBC
HCT: 38.3 % — ABNORMAL LOW (ref 40.0–52.0)
HEMOGLOBIN: 13.1 g/dL (ref 13.0–18.0)
MCH: 27.4 pg (ref 26.0–34.0)
MCHC: 34.2 g/dL (ref 32.0–36.0)
MCV: 77.8 fL — ABNORMAL LOW (ref 80.0–100.0)
Platelets: 231 10*3/uL (ref 150–440)
RBC: 4.78 MIL/uL (ref 4.40–5.90)
RDW: 16.6 % — ABNORMAL HIGH (ref 11.5–14.5)
WBC: 6.2 10*3/uL (ref 3.8–10.6)

## 2016-03-11 LAB — MRSA PCR SCREENING: MRSA by PCR: NEGATIVE

## 2016-03-11 LAB — LIPASE, BLOOD: Lipase: 270 U/L — ABNORMAL HIGH (ref 11–51)

## 2016-03-11 LAB — POTASSIUM: Potassium: 4 mmol/L (ref 3.5–5.1)

## 2016-03-11 LAB — MAGNESIUM: Magnesium: 4.4 mg/dL — ABNORMAL HIGH (ref 1.7–2.4)

## 2016-03-11 LAB — TROPONIN I: Troponin I: <0.03 ng/mL (ref <0.031)

## 2016-03-11 MED ORDER — HYDROMORPHONE HCL 1 MG/ML IJ SOLN
2.0000 mg | INTRAMUSCULAR | Status: DC | PRN
  Administered 2016-03-11 – 2016-03-18 (×45): 2 mg via INTRAVENOUS
  Filled 2016-03-11 (×45): qty 2

## 2016-03-11 MED ORDER — DEXTROSE-NACL 5-0.45 % IV SOLN
INTRAVENOUS | Status: DC
  Administered 2016-03-11 – 2016-03-12 (×3): via INTRAVENOUS

## 2016-03-11 MED ORDER — DEXTROSE 10 % IV SOLN
INTRAVENOUS | Status: DC
  Administered 2016-03-11 – 2016-03-12 (×2): via INTRAVENOUS
  Administered 2016-03-13: 75 mL/h via INTRAVENOUS
  Administered 2016-03-13 – 2016-03-16 (×12): via INTRAVENOUS
  Administered 2016-03-16: 200 mL/h via INTRAVENOUS
  Administered 2016-03-17 (×3): via INTRAVENOUS

## 2016-03-11 MED ORDER — ONDANSETRON HCL 4 MG PO TABS
4.0000 mg | ORAL_TABLET | Freq: Four times a day (QID) | ORAL | Status: DC | PRN
  Filled 2016-03-11: qty 1

## 2016-03-11 MED ORDER — SERTRALINE HCL 50 MG PO TABS
50.0000 mg | ORAL_TABLET | Freq: Every day | ORAL | Status: DC
  Administered 2016-03-11 – 2016-03-14 (×4): 50 mg via ORAL
  Filled 2016-03-11 (×4): qty 1

## 2016-03-11 MED ORDER — POTASSIUM CHLORIDE 10 MEQ/100ML IV SOLN
10.0000 meq | INTRAVENOUS | Status: AC
  Administered 2016-03-11 (×4): 10 meq via INTRAVENOUS
  Filled 2016-03-11 (×4): qty 100

## 2016-03-11 MED ORDER — ACETAMINOPHEN 325 MG PO TABS: 650.0000 mg | ORAL_TABLET | Freq: Four times a day (QID) | ORAL | Status: DC | PRN

## 2016-03-11 MED ORDER — TAMSULOSIN HCL 0.4 MG PO CAPS
0.4000 mg | ORAL_CAPSULE | Freq: Every day | ORAL | Status: DC
  Administered 2016-03-11 – 2016-03-14 (×4): 0.4 mg via ORAL
  Filled 2016-03-11 (×4): qty 1

## 2016-03-11 MED ORDER — DEXTROSE-NACL 5-0.45 % IV SOLN
INTRAVENOUS | Status: DC
  Administered 2016-03-11: 05:00:00 via INTRAVENOUS

## 2016-03-11 MED ORDER — ROSUVASTATIN CALCIUM 20 MG PO TABS: 10.0000 mg | ORAL_TABLET | Freq: Every day | ORAL | Status: DC

## 2016-03-11 MED ORDER — HYDROMORPHONE HCL 1 MG/ML IJ SOLN
1.0000 mg | Freq: Once | INTRAMUSCULAR | Status: AC
  Administered 2016-03-11: 1 mg via INTRAVENOUS

## 2016-03-11 MED ORDER — HYDROMORPHONE HCL 1 MG/ML IJ SOLN
INTRAMUSCULAR | Status: AC
  Administered 2016-03-11: 1 mg via INTRAVENOUS
  Filled 2016-03-11: qty 1

## 2016-03-11 MED ORDER — SODIUM CHLORIDE 0.9 % IV SOLN
INTRAVENOUS | Status: DC
  Administered 2016-03-11: 1.4 [IU]/h via INTRAVENOUS
  Administered 2016-03-12: 8.3 [IU]/h via INTRAVENOUS
  Administered 2016-03-12: 11:00:00 via INTRAVENOUS
  Filled 2016-03-11 (×2): qty 2.5

## 2016-03-11 MED ORDER — ACETAMINOPHEN 650 MG RE SUPP: 650.0000 mg | Freq: Four times a day (QID) | RECTAL | Status: DC | PRN

## 2016-03-11 MED ORDER — ONDANSETRON HCL 4 MG/2ML IJ SOLN
4.0000 mg | Freq: Once | INTRAMUSCULAR | Status: AC
  Administered 2016-03-11: 4 mg via INTRAVENOUS
  Filled 2016-03-11: qty 2

## 2016-03-11 MED ORDER — ALPRAZOLAM 0.5 MG PO TABS
0.5000 mg | ORAL_TABLET | Freq: Two times a day (BID) | ORAL | Status: DC | PRN
  Administered 2016-03-12 – 2016-03-18 (×7): 0.5 mg via ORAL
  Filled 2016-03-11 (×7): qty 1

## 2016-03-11 MED ORDER — FINASTERIDE 5 MG PO TABS
5.0000 mg | ORAL_TABLET | Freq: Every day | ORAL | Status: DC
  Administered 2016-03-11 – 2016-03-19 (×9): 5 mg via ORAL
  Filled 2016-03-11 (×9): qty 1

## 2016-03-11 MED ORDER — SODIUM CHLORIDE 0.9% FLUSH
3.0000 mL | Freq: Two times a day (BID) | INTRAVENOUS | Status: DC
  Administered 2016-03-11 – 2016-03-19 (×16): 3 mL via INTRAVENOUS

## 2016-03-11 MED ORDER — GEMFIBROZIL 600 MG PO TABS
600.0000 mg | ORAL_TABLET | Freq: Two times a day (BID) | ORAL | Status: DC
  Administered 2016-03-11: 600 mg via ORAL
  Filled 2016-03-11: qty 1

## 2016-03-11 MED ORDER — ENOXAPARIN SODIUM 40 MG/0.4ML ~~LOC~~ SOLN
40.0000 mg | SUBCUTANEOUS | Status: DC
  Administered 2016-03-11 – 2016-03-18 (×8): 40 mg via SUBCUTANEOUS
  Filled 2016-03-11 (×8): qty 0.4

## 2016-03-11 MED ORDER — ONDANSETRON HCL 4 MG/2ML IJ SOLN
4.0000 mg | Freq: Four times a day (QID) | INTRAMUSCULAR | Status: DC | PRN
  Administered 2016-03-11 – 2016-03-17 (×15): 4 mg via INTRAVENOUS
  Filled 2016-03-11 (×15): qty 2

## 2016-03-11 MED ORDER — TRAZODONE HCL 100 MG PO TABS
100.0000 mg | ORAL_TABLET | Freq: Every day | ORAL | Status: DC
  Administered 2016-03-11 – 2016-03-18 (×7): 100 mg via ORAL
  Filled 2016-03-11 (×4): qty 2
  Filled 2016-03-11: qty 1
  Filled 2016-03-11 (×2): qty 2
  Filled 2016-03-11: qty 1

## 2016-03-11 MED ORDER — OMEGA-3-ACID ETHYL ESTERS 1 G PO CAPS: 4.0000 g | ORAL_CAPSULE | Freq: Every day | ORAL | Status: DC

## 2016-03-11 MED ORDER — DOCUSATE SODIUM 100 MG PO CAPS
100.0000 mg | ORAL_CAPSULE | Freq: Two times a day (BID) | ORAL | Status: DC
  Administered 2016-03-13: 100 mg via ORAL
  Filled 2016-03-11 (×5): qty 1

## 2016-03-11 MED ORDER — HYDROMORPHONE HCL 1 MG/ML IJ SOLN
1.0000 mg | Freq: Once | INTRAMUSCULAR | Status: AC
  Administered 2016-03-11: 1 mg via INTRAVENOUS
  Filled 2016-03-11: qty 1

## 2016-03-11 MED ORDER — PREGABALIN 75 MG PO CAPS
300.0000 mg | ORAL_CAPSULE | Freq: Every day | ORAL | Status: DC
  Administered 2016-03-11 – 2016-03-18 (×8): 300 mg via ORAL
  Filled 2016-03-11 (×8): qty 4

## 2016-03-11 NOTE — ED Notes (Signed)
Pt aware of need for urine sample but states he does not have to void at this time

## 2016-03-11 NOTE — ED Notes (Signed)
Pt in via triage with complaints of mid upper abdominal pain x 2 days.  Pt states hx of pancreatitis.  Pt reports this episode is likely related to stress.  Pt A/Ox4, vitals WDL, no immediate distress at this time.  MD at bedside.

## 2016-03-11 NOTE — ED Provider Notes (Signed)
[facility] Emergency Department Provider Note  ____________________________________________  Time seen: 1:12 AM  I have reviewed the triage vital signs and the nursing notes.   HISTORY  Chief Complaint Abdominal Pain     HPI Sergio [name] is a 40 y.o. male with history of familial hypertriglyceridemia, multiple episodes of pancreatitis presents with epigastric abdominal pain 2 days accompanied by nonbloody emesis. Patient states the symptoms are consistent with previous episodes of pancreatitis in the past. Patient admits to very poor by mouth intake as a result. Patient denies any fever     Past Medical History  Diagnosis Date  . Recurrent acute pancreatitis     secondary to hypertriglyceridemia   . Gastric ulcer 2009  . Sleep apnea 1999    uses CPAP  . Chicken pox   . Diabetes mellitus   . HTN (hypertension)   . Familial hypertriglyceridemia     severe  . Morbid obesity ([place])     s/p bariatric sleeve surgery 01/2015  . Anxiety   . Depression     Patient Active Problem List   Diagnosis Date Noted  . Abdominal pain 01/17/2016  . Sinusitis, acute maxillary 12/13/2015  . S/P bariatric surgery 02/01/2015  . S/P laparoscopic cholecystectomy 02/01/2015  . Diarrhea 05/29/2012  . Recurrent pancreatitis ([place]) 05/29/2012  . Generalized anxiety disorder 04/29/2012  . Obesity (BMI 30-39.9) 04/29/2012  . DM (diabetes mellitus), type 2, uncontrolled w/neurologic complication ([place])   . Hypertriglyceridemia   . HTN (hypertension)   . Sleep apnea     Past Surgical History  Procedure Laterality Date  . Tonsillectomy  2003  . Vasectomy    . Lumbar disc surgery  2008  . Cholecystectomy    . Laparoscopic gastric sleeve resection      Current Outpatient Rx  Name  Route  Sig  Dispense  Refill  . ALPRAZolam (XANAX) 0.5 MG tablet   Oral   Take 1 tablet (0.5 mg total) by mouth 2 (two) times daily as needed for anxiety.   60 tablet   0   .  finasteride (PROSCAR) 5 MG tablet   Oral   Take 1 tablet (5 mg total) by mouth daily.   30 tablet   0   . gemfibrozil (LOPID) 600 MG tablet   Oral   Take 1 tablet (600 mg total) by mouth 2 (two) times daily before a meal.   60 tablet   0   . insulin aspart (NOVOLOG) 100 UNIT/ML injection   Subcutaneous   Inject 1 Dose into the skin See admin instructions. Use up to 120 units daily via insulin pump         . omega-3 acid ethyl esters (LOVAZA) 1 g capsule   Oral   Take 4 g by mouth at bedtime.         [name] oxyCODONE 10 MG TABS   Oral   Take 1 tablet (10 mg total) by mouth every 6 (six) hours as needed for moderate pain or severe pain.   30 tablet   0   . pregabalin (LYRICA) 300 MG capsule   Oral   Take 300 mg by mouth at bedtime.          . rosuvastatin (CRESTOR) 10 MG tablet   Oral   Take 1 tablet (10 mg total) by mouth daily at 6 PM.   30 tablet   0   . sertraline (ZOLOFT) 50 MG tablet   Oral   Take 50  mg by mouth daily.         . tamsulosin (FLOMAX) 0.4 MG CAPS capsule   Oral   Take 1 capsule (0.4 mg total) by mouth daily.   30 capsule   0   . traZODone (DESYREL) 100 MG tablet   Oral   Take 100 mg by mouth at bedtime.           Allergies Codeine and Ivp dye  Family History  Problem Relation Age of Onset  . Adopted: Yes  . Hypertension Mother   . Hyperlipidemia Mother   . Hypertension Father   . Hyperlipidemia Father     Social History Social History  Substance Use Topics  . Smoking status: Never Smoker   . Smokeless tobacco: Never Used  . Alcohol Use: No     Comment: once month    Review of Systems  Constitutional: Negative for fever. Eyes: Negative for visual changes. ENT: Negative for sore throat. Cardiovascular: Negative for chest pain. Respiratory: Negative for shortness of breath. Gastrointestinal: Positive for abdominal pain and vomiting Genitourinary: Negative for dysuria. Musculoskeletal: Negative for back pain. Skin:  Negative for rash. Neurological: Negative for headaches, focal weakness or numbness.   10-point ROS otherwise negative.  ____________________________________________   PHYSICAL EXAM:  VITAL SIGNS: ED Triage Vitals  Enc Vitals Group     BP 03/10/16 2340 138/89 mmHg     Pulse Rate 03/10/16 2340 104     Resp 03/10/16 2340 20     Temp 03/10/16 2340 99.1 F (37.3 C)     Temp Source 03/10/16 2340 Oral     SpO2 03/10/16 2340 97 %     Weight 03/10/16 2340 250 lb (113.399 kg)     Height 03/10/16 2340 5\' 10"  (1.778 m)     Head Cir --      Peak Flow --      Pain Score 03/10/16 2340 10     Pain Loc --      Pain Edu? --      Excl. in [place]? --      Constitutional: Alert and oriented. Well appearing and in no distress. Eyes: Conjunctivae are normal. PERRL. Normal extraocular movements. ENT   Head: Normocephalic and atraumatic.   Nose: No congestion/rhinnorhea.   Mouth/Throat: Mucous membranes are moist.   Neck: No stridor. Hematological/Lymphatic/Immunilogical: No cervical lymphadenopathy. Cardiovascular: Normal rate, regular rhythm. Normal and symmetric distal pulses are present in all extremities. No murmurs, rubs, or gallops. Respiratory: Normal respiratory effort without tachypnea nor retractions. Breath sounds are clear and equal bilaterally. No wheezes/rales/rhonchi. Gastrointestinal: Epigastric abdominal discomfort with palpation. No distention. There is no CVA tenderness. Genitourinary: deferred Musculoskeletal: Nontender with normal range of motion in all extremities. No joint effusions.  No lower extremity tenderness nor edema. Neurologic:  Normal speech and language. No gross focal neurologic deficits are appreciated. Speech is normal.  Skin:  Skin is warm, dry and intact. No rash noted. Psychiatric: Mood and affect are normal. Speech and behavior are normal. Patient exhibits appropriate insight and judgment.  ____________________________________________     LABS (pertinent positives/negatives)  Labs Reviewed  LIPASE, BLOOD - Abnormal; Notable for the following:    Lipase 270 (*)    All other components within normal limits  COMPREHENSIVE METABOLIC PANEL - Abnormal; Notable for the following:    Sodium 127 (*)    Potassium 3.1 (*)    Chloride 93 (*)    Glucose, Bld 188 (*)    Creatinine, Ser 0.50 (*)  Total Protein 6.3 (*)    Total Bilirubin <0.1 (*)    All other components within normal limits  TROPONIN I  CBC  URINALYSIS COMPLETEWITH MICROSCOPIC ([facility] ONLY)  LIPID PANEL     ____________________________________________   EKG  ED ECG REPORT I, Loretto N BROWN, the attending physician, personally viewed and interpreted this ECG.   Date: 03/11/2016  EKG Time: 11:45 PM  Rate: 101  Rhythm: Sinus tachycardia  Axis: Normal  Intervals: Normal  ST&T Change: None     INITIAL IMPRESSION / ASSESSMENT AND PLAN / ED COURSE  Pertinent labs & imaging results that were available during my care of the patient were reviewed by me and considered in my medical decision making (see chart for details).  Patient given Dilaudid 1 mg and Zofran 4 analgesia and antiemetic respectively. Patient discussed with Dr. Marcille Blanco for hospital admission further evaluation and management  ____________________________________________   FINAL CLINICAL IMPRESSION(S) / ED DIAGNOSES  Final diagnoses:  Other acute pancreatitis, unspecified complication status      Gregor Hams, MD 03/11/16 [phone]

## 2016-03-11 NOTE — Care Management (Signed)
Patient admitted with pancreatitis due to hypertriglyceridemia > 5000.  Insulin drip and endocrine consult.  Patient has admission 01/2016 for same.  Insulin pump.  Independent in all adls, denies issues accessing medical care, obtaining medications or with transportation.  Current with  PCP.  No discharge needs identified at present by care manager or members of care team.  No compliance concerns identified at present

## 2016-03-11 NOTE — Progress Notes (Signed)
Patient stated he felt like he was "retaining urine", bladder scan was done; showe only 130  Ml of urine. Will continue to monitor.

## 2016-03-11 NOTE — Progress Notes (Signed)
Pharmacy Consult for Potassium Replacement Indication: Insulin drip for TG  Allergies  Allergen Reactions  . Codeine Anaphylaxis  . Ivp Dye [Iodinated Diagnostic Agents] Other (See Comments)    Kidneys stop working    Patient Measurements: Height: 5\' 10"  (177.8 cm) Weight: 250 lb (113.399 kg) IBW/kg (Calculated) : 73  Vital Signs: Temp: 98.5 F (36.9 C) (06/01 1900) Temp Source: Oral (06/01 1900) BP: 107/60 mmHg (06/01 1900) Pulse Rate: 71 (06/01 1900) Intake/Output from previous day: 05/31 0701 - 06/01 0700 In: 1.9 [I.V.:1.9] Out: -  Intake/Output from this shift: Total I/O In: 213 [I.V.:213] Out: -   Labs:  Recent Labs  03/10/16 2346  WBC 6.2  HGB 13.1  HCT 38.3*  PLT 231     Recent Labs  03/10/16 2346 03/11/16 0451 03/11/16 1221 03/11/16 2023  NA 127*  --  128*  --   K 3.1*  --  3.5 4.0  CL 93*  --  95*  --   CO2 24  --  24  --   GLUCOSE 188*  --  172*  --   BUN 10  --  8  --   CREATININE 0.50*  --  0.50*  --   CALCIUM 9.0  --  8.0*  --   MG  --  4.4*  --   --   PROT 6.3*  --   --   --   ALBUMIN 4.8  --   --   --   AST 67*  --   --   --   ALT 51  --   --   --   ALKPHOS 82  --   --   --   BILITOT <0.1*  --   --   --   TRIG >5000*  --   --   --   CHOLHDL 56.2  --   --   --   CHOL 899*  --   --   --    Estimated Creatinine Clearance: 158 mL/min (by C-G formula based on Cr of 0.5).    Recent Labs  03/11/16 1852 03/11/16 2009 03/11/16 2056  GLUCAP 211* 209* 189*    Medical History: Past Medical History  Diagnosis Date  . Recurrent acute pancreatitis     secondary to hypertriglyceridemia   . Gastric ulcer 2009  . Sleep apnea 1999    uses CPAP  . Chicken pox   . Diabetes mellitus   . HTN (hypertension)   . Familial hypertriglyceridemia     severe  . Morbid obesity ([place])     s/p bariatric sleeve surgery 01/2015  . Anxiety   . Depression     Medications:  Scheduled:  . docusate sodium  100 mg Oral BID  . enoxaparin (LOVENOX)  injection  40 mg Subcutaneous Q24H  . finasteride  5 mg Oral Daily  . pregabalin  300 mg Oral QHS  . sertraline  50 mg Oral Daily  . sodium chloride flush  3 mL Intravenous Q12H  . tamsulosin  0.4 mg Oral Daily  . traZODone  100 mg Oral QHS   Infusions:  . dextrose 50 mL/hr at 03/11/16 2000  . dextrose 5 % and 0.45% NaCl 150 mL/hr at 03/11/16 2000  . insulin (NOVOLIN-R) infusion 8.9 mL/hr at 03/11/16 2000      Assessment: Pharmacy consulted to assist in managing potassium in this 40 y/o M with pancreatitis secondary to hypertriglyceridemia on insulin drip.   Plan:  Potassium replaced with 4  runs. Will repeat potassium level after last run and supplement as necessary.   6/1:  K @ 20:23 = 4.0.   No additional K supplementation needed.  Will recheck K on 6/2 with AM labs.   Gaylon Bentz D 03/11/2016,9:18 PM

## 2016-03-11 NOTE — ED Notes (Signed)
Patient removed insulin pump from abdomen at this time. Aware that insulin gtt is to be started; ordered from pharmacy. Patient advised to leave in place until gtt arrived. Patient states, "Oh well, it is already out. It will be ok until it gets here".

## 2016-03-11 NOTE — Progress Notes (Signed)
Pharmacy Consult for Potassium Replacement Indication: Insulin drip for TG  Allergies  Allergen Reactions  . Codeine Anaphylaxis  . Ivp Dye [Iodinated Diagnostic Agents] Other (See Comments)    Kidneys stop working    Patient Measurements: Height: 5\' 10"  (177.8 cm) Weight: 250 lb (113.399 kg) IBW/kg (Calculated) : 73  Vital Signs: Temp: 98.6 F (37 C) (06/01 1400) Temp Source: Oral (06/01 1400) BP: 98/72 mmHg (06/01 1500) Pulse Rate: 85 (06/01 1500) Intake/Output from previous day: 05/31 0701 - 06/01 0700 In: 1.9 [I.V.:1.9] Out: -  Intake/Output from this shift: Total I/O In: 1458.1 [I.V.:1158.1; IV Piggyback:300] Out: 375 [Urine:375]  Labs:  Recent Labs  03/10/16 2346  WBC 6.2  HGB 13.1  HCT 38.3*  PLT 231     Recent Labs  03/10/16 2346 03/11/16 0451 03/11/16 1221  NA 127*  --  128*  K 3.1*  --  3.5  CL 93*  --  95*  CO2 24  --  24  GLUCOSE 188*  --  172*  BUN 10  --  8  CREATININE 0.50*  --  0.50*  CALCIUM 9.0  --  8.0*  MG  --  4.4*  --   PROT 6.3*  --   --   ALBUMIN 4.8  --   --   AST 67*  --   --   ALT 51  --   --   ALKPHOS 82  --   --   BILITOT <0.1*  --   --   TRIG >5000*  --   --   CHOLHDL 56.2  --   --   CHOL 899*  --   --    Estimated Creatinine Clearance: 158 mL/min (by C-G formula based on Cr of 0.5).    Recent Labs  03/11/16 0957 03/11/16 1324 03/11/16 1428  GLUCAP 125* 180* 179*    Medical History: Past Medical History  Diagnosis Date  . Recurrent acute pancreatitis     secondary to hypertriglyceridemia   . Gastric ulcer 2009  . Sleep apnea 1999    uses CPAP  . Chicken pox   . Diabetes mellitus   . HTN (hypertension)   . Familial hypertriglyceridemia     severe  . Morbid obesity ([place])     s/p bariatric sleeve surgery 01/2015  . Anxiety   . Depression     Medications:  Scheduled:  . docusate sodium  100 mg Oral BID  . enoxaparin (LOVENOX) injection  40 mg Subcutaneous Q24H  . finasteride  5 mg Oral Daily   . pregabalin  300 mg Oral QHS  . sertraline  50 mg Oral Daily  . sodium chloride flush  3 mL Intravenous Q12H  . tamsulosin  0.4 mg Oral Daily  . traZODone  100 mg Oral QHS   Infusions:  . dextrose 50 mL/hr at 03/11/16 1321  . dextrose 5 % and 0.45% NaCl Stopped (03/11/16 1321)  . insulin (NOVOLIN-R) infusion 2.5 mL/hr at 03/11/16 1530      Assessment: Pharmacy consulted to assist in managing potassium in this 40 y/o M with pancreatitis secondary to hypertriglyceridemia on insulin drip.   Plan:  Potassium replaced with 4 runs. Will repeat potassium level after last run and supplement as necessary.   Ulice Dash D 03/11/2016,4:17 PM

## 2016-03-11 NOTE — Progress Notes (Signed)
[facility] [place] NAME: Sergio [name]    MR#:  [id number]  DATE OF BIRTH:  [birth year]  SUBJECTIVE:Admitted for abdominal pain and acute pancreatitis, DKA. In the ICU, on insulin drip. Still has abdominal pain, nausea.   CHIEF COMPLAINT:   Chief Complaint  Patient presents with  . Abdominal Pain    REVIEW OF SYSTEMS:   ROS CONSTITUTIONAL: No fever, fatigue or weakness.  EYES: No blurred or double vision.  EARS, NOSE, AND THROAT: No tinnitus or ear pain.  RESPIRATORY: No cough, shortness of breath, wheezing or hemoptysis.  CARDIOVASCULAR: No chest pain, orthopnea, edema.  GASTROINTESTINAL: Nausea, abdominal pain in the midepigastric region radiating to the back. GENITOURINARY: No dysuria, hematuria.  ENDOCRINE: No polyuria, nocturia,  HEMATOLOGY: No anemia, easy bruising or bleeding SKIN: No rash or lesion. MUSCULOSKELETAL: No joint pain or arthritis.   NEUROLOGIC: No tingling, numbness, weakness.  PSYCHIATRY: No anxiety or depression.   DRUG ALLERGIES:   Allergies  Allergen Reactions  . Codeine Anaphylaxis  . Ivp Dye [Iodinated Diagnostic Agents] Other (See Comments)    Kidneys stop working    VITALS:  Blood pressure 132/100, pulse 81, temperature 98.6 F (37 C), temperature source Oral, resp. rate 7, height 5\' 10"  (1.778 m), weight 113.399 kg (250 lb), SpO2 98 %.  PHYSICAL EXAMINATION:  GENERAL:  40 y.o.-year-old patient lying in the bed with no acute distress.  EYES: Pupils equal, round, reactive to light and accommodation. No scleral icterus. Extraocular muscles intact.  HEENT: Head atraumatic, normocephalic. Oropharynx and nasopharynx clear.  NECK:  Supple, no jugular venous distention. No thyroid enlargement, no tenderness.  LUNGS: Normal breath sounds bilaterally, no wheezing, rales,rhonchi or crepitation. No use of accessory muscles of respiration.  CARDIOVASCULAR: S1, S2 normal. No murmurs, rubs, or gallops.   ABDOMEN:Epigastric tenderness present .no rebound tenderness present. bowel sounds present EXTREMITIES: No pedal edema, cyanosis, or clubbing.  NEUROLOGIC: Cranial nerves II through XII are intact. Muscle strength 5/5 in all extremities. Sensation intact. Gait not checked.  PSYCHIATRIC: The patient is alert and oriented x 3.  SKIN: No obvious rash, lesion, or ulcer.    LABORATORY PANEL:   CBC  Recent Labs Lab 03/10/16 2346  WBC 6.2  HGB 13.1  HCT 38.3*  PLT 231   ------------------------------------------------------------------------------------------------------------------  Chemistries   Recent Labs Lab 03/10/16 2346 03/11/16 0451  NA 127*  --   K 3.1*  --   CL 93*  --   CO2 24  --   GLUCOSE 188*  --   BUN 10  --   CREATININE 0.50*  --   CALCIUM 9.0  --   MG  --  4.4*  AST 67*  --   ALT 51  --   ALKPHOS 82  --   BILITOT <0.1*  --    ------------------------------------------------------------------------------------------------------------------  Cardiac Enzymes  Recent Labs Lab 03/10/16 2346  TROPONINI <0.03   ------------------------------------------------------------------------------------------------------------------  RADIOLOGY:  No results found.  EKG:   Orders placed or performed during the hospital encounter of 03/11/16  . ED EKG  . ED EKG  . EKG 12-Lead  . EKG 12-Lead    ASSESSMENT AND PLAN:   Acute on chronic pancreatitis secondary to severe hypertriglyceridemia: Continue nothing by mouth, IV hydration, IV pain medication. Patient on multiple medications for high cholesterol including gemfibrozil,  Crestor. I gave the right side more than 5000. #2 diabetes mellitus type 2: On insulin drip. Continue. Continue IV hydration because patient  is nothing by mouth. An endocrinology consult with Dr. Gabriel Carina.. Patient;Hospital glucose 128. AG is 10.. #3 severe hypokalemia replace the potassium. #4 severe hyponatremia secondary to hyperosmolar  state'continue IV hydration and recheck the sodium again. 5.    All the records are reviewed and case discussed with Care Management/Social Workerr. Management plans discussed with the patient, family and they are in agreement.  CODE STATUS: full  TOTAL TIME TAKING CARE OF THIS PATIENT: 35 minutes.   POSSIBLE D/C IN  1-2DAYS, DEPENDING ON CLINICAL CONDITION.   Epifanio Lesches M.D on 03/11/2016 at 1:05 PM  Between 7am to 6pm - Pager - [phone]  After 6pm go to [email or web address] - password [email or web address] [place] [facility]  Office  [phone]  CC: Primary care physician; Crecencio Mc, MD   Note: This dictation was prepared with Dragon dictation along with smaller phrase technology. Any transcriptional errors that result from this process are unintentional.

## 2016-03-11 NOTE — Treatment Plan (Signed)
Endocrine note:  IV insulin infusing at 2.5 u/hr with D10 at 50 mL/hr (started this afternoon). Patient's wife concerned that this is much less insulin than he typically requires.   Discussed with RN. Will add D5 1/2 NS at 150 mL/hr since patient is NPO with acute pancreatitis. Hydration is key and we will need to avoid hypoglycemia. Continue hourly glucometer checks and titrate insulin infusion rate up to 5.0 u/hr. Aim to maintain BG 100-180 mg/dL.  Dr. Gabriel Carina will see the patient in the morning.   Sergio Starks, MD [facility] Endocrinology

## 2016-03-11 NOTE — H&P (Addendum)
Sergio [name] is an 40 y.o. male.   Chief Complaint: Abdominal pain HPI: The patient with past medical history of hypertriglyceridemia and recurrent pancreatitis presents emergency department complaining of abdominal pain. The patient states his pain is epigastric and associated with persistent nausea. He is had several episodes of vomiting over the last 12 hours. Emesis has been nonbloody and nonbilious. Laboratory evaluation showed elevated lipase as well as hyponatremia associated with hypertriglyceridemia which prompted the emergency department staff to call for admission.  Past Medical History  Diagnosis Date  . Recurrent acute pancreatitis     secondary to hypertriglyceridemia   . Gastric ulcer 2009  . Sleep apnea 1999    uses CPAP  . Chicken pox   . Diabetes mellitus   . HTN (hypertension)   . Familial hypertriglyceridemia     severe  . Morbid obesity ([place])     s/p bariatric sleeve surgery 01/2015  . Anxiety   . Depression     Past Surgical History  Procedure Laterality Date  . Tonsillectomy  2003  . Vasectomy    . Lumbar disc surgery  2008  . Cholecystectomy    . Laparoscopic gastric sleeve resection      Family History  Problem Relation Age of Onset  . Adopted: Yes  . Hypertension Mother   . Hyperlipidemia Mother   . Hypertension Father   . Hyperlipidemia Father    Social History:  reports that he has never smoked. He has never used smokeless tobacco. He reports that he does not drink alcohol or use illicit drugs.  Allergies:  Allergies  Allergen Reactions  . Codeine Anaphylaxis  . Ivp Dye [Iodinated Diagnostic Agents] Other (See Comments)    Kidneys stop working    Prior to Admission medications   Medication Sig Start Date End Date Taking? Authorizing Provider  ALPRAZolam Duanne Moron) 0.5 MG tablet Take 1 tablet (0.5 mg total) by mouth 2 (two) times daily as needed for anxiety. 12/12/15  Yes Crecencio Mc, MD  finasteride (PROSCAR) 5 MG tablet Take 1 tablet (5  mg total) by mouth daily. 01/27/16  Yes Vaughan Basta, MD  gemfibrozil (LOPID) 600 MG tablet Take 1 tablet (600 mg total) by mouth 2 (two) times daily before a meal. 08/15/15  Yes Loletha Grayer, MD  insulin aspart (NOVOLOG) 100 UNIT/ML injection Inject 1 Dose into the skin See admin instructions. Use up to 120 units daily via insulin pump 09/11/15  Yes Historical Provider, MD  omega-3 acid ethyl esters (LOVAZA) 1 g capsule Take 4 g by mouth at bedtime.   Yes Historical Provider, MD  oxyCODONE 10 MG TABS Take 1 tablet (10 mg total) by mouth every 6 (six) hours as needed for moderate pain or severe pain. 01/27/16  Yes Vaughan Basta, MD  pregabalin (LYRICA) 300 MG capsule Take 300 mg by mouth at bedtime.    Yes Historical Provider, MD  rosuvastatin (CRESTOR) 10 MG tablet Take 1 tablet (10 mg total) by mouth daily at 6 PM. 08/15/15  Yes Loletha Grayer, MD  sertraline (ZOLOFT) 50 MG tablet Take 50 mg by mouth daily.   Yes Historical Provider, MD  tamsulosin (FLOMAX) 0.4 MG CAPS capsule Take 1 capsule (0.4 mg total) by mouth daily. 01/27/16  Yes Vaughan Basta, MD  traZODone (DESYREL) 100 MG tablet Take 100 mg by mouth at bedtime.   Yes Historical Provider, MD     Results for orders placed or performed during the hospital encounter of 03/11/16 (from the past 48  hour(s))  Lipase, blood     Status: Abnormal   Collection Time: 03/10/16 11:46 PM  Result Value Ref Range   Lipase 270 (H) 11 - 51 U/L  Comprehensive metabolic panel     Status: Abnormal   Collection Time: 03/10/16 11:46 PM  Result Value Ref Range   Sodium 127 (L) 135 - 145 mmol/L   Potassium 3.1 (L) 3.5 - 5.1 mmol/L   Chloride 93 (L) 101 - 111 mmol/L   CO2 24 22 - 32 mmol/L   Glucose, Bld 188 (H) 65 - 99 mg/dL   BUN 10 6 - 20 mg/dL   Creatinine, Ser 0.50 (L) 0.61 - 1.24 mg/dL   Calcium 9.0 8.9 - 10.3 mg/dL   Total Protein 6.3 (L) 6.5 - 8.1 g/dL   Albumin 4.8 3.5 - 5.0 g/dL   AST UNABLE TO REPORT DUE TO LIPEMIC  INTERFERENCE 15 - 41 U/L   ALT UNABLE TO REPORT DUE TO LIPEMIC INTERFERENCE 17 - 63 U/L   Alkaline Phosphatase 82 38 - 126 U/L   Total Bilirubin <0.1 (L) 0.3 - 1.2 mg/dL   GFR calc non Af Amer >60 >60 mL/min   GFR calc Af Amer >60 >60 mL/min    Comment: (NOTE) The eGFR has been calculated using the CKD EPI equation. This calculation has not been validated in all clinical situations. eGFR's persistently <60 mL/min signify possible Chronic Kidney Disease.    Anion gap 10 5 - 15  CBC     Status: Abnormal   Collection Time: 03/10/16 11:46 PM  Result Value Ref Range   WBC 6.2 3.8 - 10.6 K/uL   RBC 4.78 4.40 - 5.90 MIL/uL   Hemoglobin 13.1 13.0 - 18.0 g/dL   HCT 38.3 (L) 40.0 - 52.0 %    Comment: SPUN HCT   MCV 77.8 (L) 80.0 - 100.0 fL   MCH 27.4 26.0 - 34.0 pg   MCHC 34.2 32.0 - 36.0 g/dL   RDW 16.6 (H) 11.5 - 14.5 %   Platelets 231 150 - 440 K/uL  Troponin I     Status: None   Collection Time: 03/10/16 11:46 PM  Result Value Ref Range   Troponin I <0.03 <0.031 ng/mL    Comment:        NO INDICATION OF MYOCARDIAL INJURY.    No results found.  Review of Systems  Constitutional: Negative for fever and chills.  HENT: Negative for sore throat and tinnitus.   Eyes: Negative for blurred vision and redness.  Respiratory: Negative for cough and shortness of breath.   Cardiovascular: Negative for chest pain, palpitations, orthopnea and PND.  Gastrointestinal: Positive for nausea, vomiting and abdominal pain. Negative for diarrhea.  Genitourinary: Negative for dysuria, urgency and frequency.  Musculoskeletal: Negative for myalgias and joint pain.  Skin: Negative for rash.       No lesions  Neurological: Negative for speech change, focal weakness and weakness.  Endo/Heme/Allergies: Does not bruise/bleed easily.       No temperature intolerance  Psychiatric/Behavioral: Negative for depression and suicidal ideas.    Blood pressure 137/88, pulse 94, temperature 99.1 F (37.3 C),  temperature source Oral, resp. rate 22, height 5' 10"  (1.778 m), weight 113.399 kg (250 lb), SpO2 95 %. Physical Exam  Constitutional: He is oriented to person, place, and time. He appears well-developed and well-nourished. No distress.  HENT:  Head: Normocephalic and atraumatic.  Mouth/Throat: Oropharynx is clear and moist.  Eyes: Conjunctivae and EOM are normal. Pupils are  equal, round, and reactive to light. No scleral icterus.  Neck: Normal range of motion. Neck supple. No JVD present. No tracheal deviation present. No thyromegaly present.  Cardiovascular: Normal rate, regular rhythm and normal heart sounds.  Exam reveals no gallop and no friction rub.   No murmur heard. Respiratory: Effort normal and breath sounds normal. No respiratory distress.  GI: Soft. Bowel sounds are normal. He exhibits no distension and no mass. There is tenderness. There is no rebound and no guarding.  Genitourinary:  Deferred  Musculoskeletal: Normal range of motion. He exhibits no edema.  Lymphadenopathy:    He has no cervical adenopathy.  Neurological: He is alert and oriented to person, place, and time. No cranial nerve deficit.  Skin: Skin is warm and dry. No rash noted. No erythema.  Psychiatric: He has a normal mood and affect. His behavior is normal. Judgment and thought content normal.     Assessment/Plan This is a 40 year old male admitted for pancreatitis. 1. Pancreatitis: Secondary to hypertriglyceridemia. Evaluate lipid panel. I have placed patient on an insulin drip and made him nothing by mouth. We will manage pain and hydrate with intravenous fluid. Continue gemfibrozil, Lovaza and Crestor. 2. Diabetes mellitus type 2: Uncontrolled with neurologic complications; check hemoglobin A1c. Hourly glucose checks while on insulin drip. Once the patient is able to tolerate oral intake we will start sliding scale insulin via insulin pump. Continue Lyrica for neuropathy 3. Hyponatremia: Pseudo- secondary  to hypertriglyceridemia. 4. Essential hypertension: Controlled 5. BPH: Continue finasteride and tamsulosin 6. Depression: Continue Zoloft and trazodone 7. DVT prophylaxis: Lovenox 8. GI prophylaxis: None The patient is a full code. Time spent on admission orders and patient care approximately 45 minutes  Harrie Foreman, MD 03/11/2016, 3:14 AM

## 2016-03-11 NOTE — Progress Notes (Addendum)
Inpatient Diabetes Program Recommendations  AACE/ADA: New Consensus Statement on Inpatient Glycemic Control (2015)  Target Ranges:  Prepandial:   less than 140 mg/dL      Peak postprandial:   less than 180 mg/dL (1-2 hours)      Critically ill patients:  140 - 180 mg/dL   Lab Results  Component Value Date   GLUCAP 128* 03/11/2016   HGBA1C 9.7* 01/16/2016    Review of Glycemic Control  Results for GRADIN, KASPAR (MRN [id number]) as of 03/11/2016 10:15  Ref. Range 03/11/2016 04:56 03/11/2016 06:00 03/11/2016 07:01 03/11/2016 07:57 03/11/2016 09:05  Glucose-Capillary Latest Ref Range: 65-99 mg/dL 198 (H) 207 (H) 175 (H) 152 (H) 128 (H)    Diabetes history: Type 2 Outpatient Diabetes medications: Per MD note on 02/06/16  Diabetes - His Hgb A1c on 01/16/16 was 9.7%. Now using the MiniMed Medtronic 530 G insulin pump and Contour Next link glucometer. Diabetes is complicated by peripheral neuropathy. Pump was downloaded. Current settings: Basal rates 12 am 2.85 units/hr 8 am 2.95 units/hr 12 pm 2.6 units/hr 24-hr basal = 65.8 units  Bolus settings I:C ratio 12 am 3, 12 pm 3, 3 pm 2 Sensitivity 12 am 19, 4 pm 17 Target 100-110  Current orders for Inpatient glycemic control: Patient has insulin pump off.  IV insulin 12 units per hour and "Inititate and follow ICU Glycemic Control Protocol (Phase 2)-Until discontinued"  Patient is currently being administered IV insulin according to the order for phase 2 ICU glycemic control protocol via the glucostabilizer.   Inpatient Diabetes Program Recommendations:    Patient was admitted to [facility] on 01/19/16 and at that time he was on 12 units of IV insulin q hour and IV dextrose with the goal of managing CBG 100-180 range.  Dr. Gabriel Carina kept the patient on IV insulin until serum Tg level <1000.  Consider obtaining a consult to Dr. Gabriel Carina. Consider rechecking triglycerides today.   Gentry Fitz, RN, BA, MHA, CDE Diabetes Coordinator Inpatient Diabetes  Program  [phone] (Team Pager) [phone] ([place]) 03/11/2016 10:51 AM

## 2016-03-11 NOTE — ED Notes (Signed)
Pt reports he started with abd pain yesterday that got worse today - pt reports history pancreatitis and he states this pain feels the same - Pt states nausea/vomiting (vomited 2 times today) - Pt denies diarrhea

## 2016-03-11 NOTE — Consult Note (Signed)
ENDOCRINOLOGY CONSULTATION  REFERRING PHYSICIAN:  Governor Specking CONSULTING PHYSICIAN:  Atha Starks, MD. PRIMARY CARE PROVIDER:  [name], MD.  CHIEF COMPLAINT:  Hypertriglyceridemia-induced pancreatitis  HISTORY OF PRESENT ILLNESS:  40 y.o. male with type 2 diabetes and familial hypertriglyceridemia with recurrent acute pancreatitis due to hypertriglyceridemia admitted overnight with acute pancreatitis. He was found to have triglyceride level >5000, lipase 270 and glucose of 188 upon admission.  He was admitted to the ICU and started on IVF and IV insulin. He is NPO. Endocrinology consulted for further management.  Patient is well known to Dr. Gabriel Carina, his Endocrinologist. His diabetes is managed with an insulin pump.  Hypertriglyceridemia typically managed with fibrate + fish oil. He reports adherence with his medications. He reports no dietary indiscretions. Denies alcohol use. He has had increased stress recently at work and reports persistent hyperglycemia lately. The day of presentation, he developed abdominal pain and nausea which progressively worsened. He had some emesis as well.  Currently, the patient reports 9/10 abdominal pain, not much improved from admission. He took some medications by mouth this am and afterwards experienced increased nausea. Zofran is helping and he has not had emesis today. Currently, he is receiving IV insulin infusion at 0.7 u/hr and his BG has been in the 120 range.  PAST MEDICAL HISTORY:  Past Medical History  Diagnosis Date  . Recurrent acute pancreatitis     secondary to hypertriglyceridemia   . Gastric ulcer 2009  . Sleep apnea 1999    uses CPAP  . Chicken pox   . Diabetes mellitus   . HTN (hypertension)   . Familial hypertriglyceridemia     severe  . Morbid obesity ([place])     s/p bariatric sleeve surgery 01/2015  . Anxiety   . Depression      CURRENT MEDICATIONS:  . docusate sodium  100 mg Oral BID  . enoxaparin (LOVENOX) injection  40  mg Subcutaneous Q24H  . finasteride  5 mg Oral Daily  . gemfibrozil  600 mg Oral BID AC  . omega-3 acid ethyl esters  4 g Oral QHS  . potassium chloride  10 mEq Intravenous Q1 Hr x 4  . pregabalin  300 mg Oral QHS  . rosuvastatin  10 mg Oral q1800  . sertraline  50 mg Oral Daily  . sodium chloride flush  3 mL Intravenous Q12H  . tamsulosin  0.4 mg Oral Daily  . traZODone  100 mg Oral QHS   SOCIAL HISTORY:  Social History  Substance Use Topics  . Smoking status: Never Smoker   . Smokeless tobacco: Never Used  . Alcohol Use: No     Comment: once month     FAMILY HISTORY:   Family History  Problem Relation Age of Onset  . Adopted: Yes  . Hypertension Mother   . Hyperlipidemia Mother   . Hypertension Father   . Hyperlipidemia Father      ALLERGIES:  Allergies  Allergen Reactions  . Codeine Anaphylaxis  . Ivp Dye [Iodinated Diagnostic Agents] Other (See Comments)    Kidneys stop working    REVIEW OF SYSTEMS:  GENERAL:  No weight loss.  No fever.  HEENT:  No blurred vision. No sore throat.  NECK:  No neck pain or dysphagia.  CARDIAC:  No chest pain or palpitation.  PULMONARY:  No cough or shortness of breath.  ABDOMEN:  No constipation or diarrhea. EXTREMITIES:  +Neuropathic pain. No lower extremity swelling.  ENDOCRINE:  No heat or cold intolerance.  GENITOURINARY:  No dysuria or hematuria. SKIN:  No recent rash or skin changes.   PHYSICAL EXAMINATION:  BP 132/100 mmHg  Pulse 81  Temp(Src) 98.3 F (36.8 C) (Oral)  Resp 7  Ht 5\' 10"  (1.778 m)  Wt 113.399 kg (250 lb)  BMI 35.87 kg/m2  SpO2 98%  GENERAL:  Obese male in NAD but appearing uncomfortable. HEENT:  EOMI.Eyes anicteric. Oxygen mask on face. NECK:  Supple.  No thyromegaly.  No neck tenderness.  CARDIAC:  Regular rate and rhythm without murmur.  PULMONARY:  Clear to auscultation bilaterally.  ABDOMEN:  Diffusely soft. +subjective tenderness in the epigastrium, no rebound or guarding, decreased  bowel sounds    EXTREMITIES:  No peripheral edema .    SKIN:  No rash or dermatopathy. No xanthomata.  NEUROLOGIC:  AAOx3. No dysarthria.  No tremor. PSYCHIATRIC:  Alert and oriented, calm, cooperative.   LABORATORY DATA:  Results for orders placed or performed during the hospital encounter of 03/11/16 (from the past 24 hour(s))  Lipase, blood     Status: Abnormal   Collection Time: 03/10/16 11:46 PM  Result Value Ref Range   Lipase 270 (H) 11 - 51 U/L  Comprehensive metabolic panel     Status: Abnormal   Collection Time: 03/10/16 11:46 PM  Result Value Ref Range   Sodium 127 (L) 135 - 145 mmol/L   Potassium 3.1 (L) 3.5 - 5.1 mmol/L   Chloride 93 (L) 101 - 111 mmol/L   CO2 24 22 - 32 mmol/L   Glucose, Bld 188 (H) 65 - 99 mg/dL   BUN 10 6 - 20 mg/dL   Creatinine, Ser 0.50 (L) 0.61 - 1.24 mg/dL   Calcium 9.0 8.9 - 10.3 mg/dL   Total Protein 6.3 (L) 6.5 - 8.1 g/dL   Albumin 4.8 3.5 - 5.0 g/dL   AST 67 (H) 15 - 41 U/L   ALT 51 17 - 63 U/L   Alkaline Phosphatase 82 38 - 126 U/L   Total Bilirubin <0.1 (L) 0.3 - 1.2 mg/dL   GFR calc non Af Amer >60 >60 mL/min   GFR calc Af Amer >60 >60 mL/min   Anion gap 10 5 - 15  CBC     Status: Abnormal   Collection Time: 03/10/16 11:46 PM  Result Value Ref Range   WBC 6.2 3.8 - 10.6 K/uL   RBC 4.78 4.40 - 5.90 MIL/uL   Hemoglobin 13.1 13.0 - 18.0 g/dL   HCT 38.3 (L) 40.0 - 52.0 %   MCV 77.8 (L) 80.0 - 100.0 fL   MCH 27.4 26.0 - 34.0 pg   MCHC 34.2 32.0 - 36.0 g/dL   RDW 16.6 (H) 11.5 - 14.5 %   Platelets 231 150 - 440 K/uL  Troponin I     Status: None   Collection Time: 03/10/16 11:46 PM  Result Value Ref Range   Troponin I <0.03 <0.031 ng/mL  Lipid panel     Status: Abnormal   Collection Time: 03/10/16 11:46 PM  Result Value Ref Range   Cholesterol 899 (H) 0 - 200 mg/dL   Triglycerides >5000 (H) <150 mg/dL   HDL 16 (L) >40 mg/dL   Total CHOL/HDL Ratio 56.2 RATIO   VLDL UNABLE TO CALCULATE IF TRIGLYCERIDE OVER 400 mg/dL 0 - 40  mg/dL   LDL Cholesterol UNABLE TO CALCULATE IF TRIGLYCERIDE OVER 400 mg/dL 0 - 99 mg/dL  Glucose, capillary     Status: Abnormal   Collection Time: 03/11/16  4:37  AM  Result Value Ref Range   Glucose-Capillary 202 (H) 65 - 99 mg/dL  TSH     Status: Abnormal   Collection Time: 03/11/16  4:51 AM  Result Value Ref Range   TSH 6.098 (H) 0.350 - 4.500 uIU/mL  Magnesium     Status: Abnormal   Collection Time: 03/11/16  4:51 AM  Result Value Ref Range   Magnesium 4.4 (H) 1.7 - 2.4 mg/dL  Glucose, capillary     Status: Abnormal   Collection Time: 03/11/16  4:56 AM  Result Value Ref Range   Glucose-Capillary 198 (H) 65 - 99 mg/dL  MRSA PCR Screening     Status: None   Collection Time: 03/11/16  5:21 AM  Result Value Ref Range   MRSA by PCR NEGATIVE NEGATIVE  Glucose, capillary     Status: Abnormal   Collection Time: 03/11/16  6:00 AM  Result Value Ref Range   Glucose-Capillary 207 (H) 65 - 99 mg/dL  Glucose, capillary     Status: Abnormal   Collection Time: 03/11/16  7:01 AM  Result Value Ref Range   Glucose-Capillary 175 (H) 65 - 99 mg/dL  Glucose, capillary     Status: Abnormal   Collection Time: 03/11/16  7:57 AM  Result Value Ref Range   Glucose-Capillary 152 (H) 65 - 99 mg/dL  Glucose, capillary     Status: Abnormal   Collection Time: 03/11/16  9:05 AM  Result Value Ref Range   Glucose-Capillary 128 (H) 65 - 99 mg/dL   Lab Results  Component Value Date   HGBA1C 9.7* 01/16/2016     ASSESSMENT:  1.Acute pancreatitis due to hypertriglyceridemia. 2. Familial hypertriglyceridemia 3. Type 2 diabetes with hyperglycemia, uncontrolled  PLAN: 1. I recommend continued use of IV insulin per ICU protocol. Hourly glucometer checks. 2. Would start infusion of IV dextrose (D10) 50 mL/hr with goal to keep sugars in the 120-180 mg/dL range.  3. Keep NPO strictly including oral medications if possible. 4. Trend triglycerides daily and continue IV insulin until serum Tg level  <1000. 5. Hold statin, fibrate, and fish oil for now. Resume once he improves clinically. 6. He will need to restart insulin pump once ready to transition off the IV insulin. He has his necessary supplies.  Plan reviewed with his RN. Please call if any questions. Dr. Gabriel Carina will see the patient tomorrow.   Atha Starks, MD [facility] Endocrinology

## 2016-03-12 LAB — GLUCOSE, CAPILLARY
Glucose-Capillary: 112 mg/dL — ABNORMAL HIGH (ref 65–99)
Glucose-Capillary: 114 mg/dL — ABNORMAL HIGH (ref 65–99)
Glucose-Capillary: 117 mg/dL — ABNORMAL HIGH (ref 65–99)
Glucose-Capillary: 120 mg/dL — ABNORMAL HIGH (ref 65–99)
Glucose-Capillary: 123 mg/dL — ABNORMAL HIGH (ref 65–99)
Glucose-Capillary: 126 mg/dL — ABNORMAL HIGH (ref 65–99)
Glucose-Capillary: 134 mg/dL — ABNORMAL HIGH (ref 65–99)
Glucose-Capillary: 143 mg/dL — ABNORMAL HIGH (ref 65–99)
Glucose-Capillary: 147 mg/dL — ABNORMAL HIGH (ref 65–99)
Glucose-Capillary: 152 mg/dL — ABNORMAL HIGH (ref 65–99)
Glucose-Capillary: 156 mg/dL — ABNORMAL HIGH (ref 65–99)
Glucose-Capillary: 160 mg/dL — ABNORMAL HIGH (ref 65–99)
Glucose-Capillary: 186 mg/dL — ABNORMAL HIGH (ref 65–99)
Glucose-Capillary: 192 mg/dL — ABNORMAL HIGH (ref 65–99)
Glucose-Capillary: 198 mg/dL — ABNORMAL HIGH (ref 65–99)
Glucose-Capillary: 199 mg/dL — ABNORMAL HIGH (ref 65–99)
Glucose-Capillary: 209 mg/dL — ABNORMAL HIGH (ref 65–99)
Glucose-Capillary: 225 mg/dL — ABNORMAL HIGH (ref 65–99)
Glucose-Capillary: 71 mg/dL (ref 65–99)
Glucose-Capillary: 76 mg/dL (ref 65–99)
Glucose-Capillary: 85 mg/dL (ref 65–99)
Glucose-Capillary: 99 mg/dL (ref 65–99)

## 2016-03-12 LAB — BASIC METABOLIC PANEL
ANION GAP: 9 (ref 5–15)
BUN: 5 mg/dL — ABNORMAL LOW (ref 6–20)
CALCIUM: 7.9 mg/dL — AB (ref 8.9–10.3)
CO2: 27 mmol/L (ref 22–32)
Chloride: 91 mmol/L — ABNORMAL LOW (ref 101–111)
Creatinine, Ser: 0.8 mg/dL (ref 0.61–1.24)
GFR calc non Af Amer: >60 mL/min (ref >60)
Glucose, Bld: 205 mg/dL — ABNORMAL HIGH (ref 65–99)
Potassium: 5.3 mmol/L — ABNORMAL HIGH (ref 3.5–5.1)
Sodium: 127 mmol/L — ABNORMAL LOW (ref 135–145)

## 2016-03-12 LAB — TRIGLYCERIDES: Triglycerides: >5000 mg/dL — ABNORMAL HIGH (ref <150)

## 2016-03-12 LAB — LIPASE, BLOOD: LIPASE: 43 U/L (ref 11–51)

## 2016-03-12 MED ORDER — DEXTROSE 50 % IV SOLN
25.0000 mL | Freq: Once | INTRAVENOUS | Status: AC
  Administered 2016-03-12: 25 mL via INTRAVENOUS
  Filled 2016-03-12: qty 50

## 2016-03-12 MED ORDER — SODIUM CHLORIDE 0.9 % IV SOLN
INTRAVENOUS | Status: DC
  Administered 2016-03-12 – 2016-03-17 (×5): 12 [IU]/h via INTRAVENOUS
  Filled 2016-03-12 (×8): qty 2.5

## 2016-03-12 MED ORDER — GEMFIBROZIL 600 MG PO TABS
600.0000 mg | ORAL_TABLET | Freq: Two times a day (BID) | ORAL | Status: DC
  Administered 2016-03-12 – 2016-03-19 (×14): 600 mg via ORAL
  Filled 2016-03-12 (×14): qty 1

## 2016-03-12 NOTE — Progress Notes (Signed)
Endocrinology follow up  REQUESTING PROVIDER: Laverle Hobby, MD CONSULTING PROVIDER:  Lavone Orn, MD   History of present illness Sergio [name] is a 40 y.o. male seen in follow up for acute pancreatitis due to hypertriglyceridemia. He reports persistent abdominal pain, epigastric region. Pain improving. No nausea. No emesis. No appetite. Currently on IV insulin via glucostabilizer order set, not at a rate of 3.5 units/hr, along with D5NS at 50 cc/hr and 0.9% NS at 150 cc/hr. Lipase normalized today at 43 (from 270). Serum triglycerides remain high at >5000. Sugars have been in the 76 - 130 range.   Medical history Past Medical History  Diagnosis Date  . Recurrent acute pancreatitis     secondary to hypertriglyceridemia   . Gastric ulcer 2009  . Sleep apnea 1999    uses CPAP  . Chicken pox   . Diabetes mellitus   . HTN (hypertension)   . Familial hypertriglyceridemia     severe  . Morbid obesity ([place])     s/p bariatric sleeve surgery 01/2015  . Anxiety   . Depression      Surgical history Past Surgical History  Procedure Laterality Date  . Tonsillectomy  2003  . Vasectomy    . Lumbar disc surgery  2008  . Cholecystectomy    . Laparoscopic gastric sleeve resection       Medications . docusate sodium  100 mg Oral BID  . enoxaparin (LOVENOX) injection  40 mg Subcutaneous Q24H  . finasteride  5 mg Oral Daily  . pregabalin  300 mg Oral QHS  . sertraline  50 mg Oral Daily  . sodium chloride flush  3 mL Intravenous Q12H  . tamsulosin  0.4 mg Oral Daily  . traZODone  100 mg Oral QHS     Social history Social History  Substance Use Topics  . Smoking status: Never Smoker   . Smokeless tobacco: Never Used  . Alcohol Use: No     Comment: once month     Family history Family History  Problem Relation Age of Onset  . Adopted: Yes  . Hypertension Mother   . Hyperlipidemia Mother   . Hypertension Father   . Hyperlipidemia Father     Physical Exam BP  116/75 mmHg  Pulse 84  Temp(Src) 97.8 F (36.6 C) (Oral)  Resp 17  Ht 5\' 10"  (1.778 m)  Wt 114 kg (251 lb 5.2 oz)  BMI 36.06 kg/m2  SpO2 93%  GEN: Overweight white male, in NAD. Resting with nasal bipap in place. HEENT: No proptosis, EOMI, lid lag or stare. Oropharynx is clear.  NECK: supple, trachea midline.   RESPIRATORY: clear bilaterally, no wheeze, good inspiratory effort. CV: No carotid bruits, RRR. MUSCULOSKELETAL: normal gait and station, no clubbing, no tremor ABD: soft, +epigastric TTP, no organomegaly EXT: no peripheral edema SKIN: no dermatopathy or rash or acanthosis nigricans. No xanthomas or xanthomatas. LYMPH: no submandibular or supraclavicular LAD NEURO: PERRL. PSYC: alert and oriented, good insight  Labs Results for orders placed or performed during the hospital encounter of 03/11/16 (from the past 24 hour(s))  Glucose, capillary     Status: Abnormal   Collection Time: 03/11/16  2:28 PM  Result Value Ref Range   Glucose-Capillary 179 (H) 65 - 99 mg/dL  Glucose, capillary     Status: Abnormal   Collection Time: 03/11/16  3:38 PM  Result Value Ref Range   Glucose-Capillary 187 (H) 65 - 99 mg/dL  Glucose, capillary     Status: Abnormal  Collection Time: 03/11/16  5:08 PM  Result Value Ref Range   Glucose-Capillary 222 (H) 65 - 99 mg/dL  Glucose, capillary     Status: Abnormal   Collection Time: 03/11/16  6:07 PM  Result Value Ref Range   Glucose-Capillary 224 (H) 65 - 99 mg/dL  Glucose, capillary     Status: Abnormal   Collection Time: 03/11/16  6:52 PM  Result Value Ref Range   Glucose-Capillary 211 (H) 65 - 99 mg/dL  Glucose, capillary     Status: Abnormal   Collection Time: 03/11/16  8:09 PM  Result Value Ref Range   Glucose-Capillary 209 (H) 65 - 99 mg/dL  Potassium     Status: None   Collection Time: 03/11/16  8:23 PM  Result Value Ref Range   Potassium 4.0 3.5 - 5.1 mmol/L  Glucose, capillary     Status: Abnormal   Collection Time: 03/11/16   8:56 PM  Result Value Ref Range   Glucose-Capillary 189 (H) 65 - 99 mg/dL  Glucose, capillary     Status: Abnormal   Collection Time: 03/11/16  9:54 PM  Result Value Ref Range   Glucose-Capillary 203 (H) 65 - 99 mg/dL  Glucose, capillary     Status: Abnormal   Collection Time: 03/11/16 11:26 PM  Result Value Ref Range   Glucose-Capillary 198 (H) 65 - 99 mg/dL  Glucose, capillary     Status: Abnormal   Collection Time: 03/12/16 12:19 AM  Result Value Ref Range   Glucose-Capillary 152 (H) 65 - 99 mg/dL  Glucose, capillary     Status: Abnormal   Collection Time: 03/12/16  1:31 AM  Result Value Ref Range   Glucose-Capillary 156 (H) 65 - 99 mg/dL  Glucose, capillary     Status: Abnormal   Collection Time: 03/12/16  2:32 AM  Result Value Ref Range   Glucose-Capillary 186 (H) 65 - 99 mg/dL  Glucose, capillary     Status: Abnormal   Collection Time: 03/12/16  3:35 AM  Result Value Ref Range   Glucose-Capillary 209 (H) 65 - 99 mg/dL  Triglycerides     Status: Abnormal   Collection Time: 03/12/16  3:59 AM  Result Value Ref Range   Triglycerides >5000 (H) <150 mg/dL  Basic metabolic panel     Status: Abnormal   Collection Time: 03/12/16  4:00 AM  Result Value Ref Range   Sodium 127 (L) 135 - 145 mmol/L   Potassium 5.3 (H) 3.5 - 5.1 mmol/L   Chloride 91 (L) 101 - 111 mmol/L   CO2 27 22 - 32 mmol/L   Glucose, Bld 205 (H) 65 - 99 mg/dL   BUN 5 (L) 6 - 20 mg/dL   Creatinine, Ser 0.80 0.61 - 1.24 mg/dL   Calcium 7.9 (L) 8.9 - 10.3 mg/dL   GFR calc non Af Amer >60 >60 mL/min   GFR calc Af Amer >60 >60 mL/min   Anion gap 9 5 - 15  Lipase, blood     Status: None   Collection Time: 03/12/16  4:00 AM  Result Value Ref Range   Lipase 43 11 - 51 U/L  Glucose, capillary     Status: Abnormal   Collection Time: 03/12/16  4:40 AM  Result Value Ref Range   Glucose-Capillary 225 (H) 65 - 99 mg/dL  Glucose, capillary     Status: Abnormal   Collection Time: 03/12/16  5:58 AM  Result Value Ref  Range   Glucose-Capillary 199 (H) 65 - 99  mg/dL  Glucose, capillary     Status: Abnormal   Collection Time: 03/12/16  7:11 AM  Result Value Ref Range   Glucose-Capillary 192 (H) 65 - 99 mg/dL  Glucose, capillary     Status: Abnormal   Collection Time: 03/12/16  8:23 AM  Result Value Ref Range   Glucose-Capillary 198 (H) 65 - 99 mg/dL  Glucose, capillary     Status: Abnormal   Collection Time: 03/12/16  8:32 AM  Result Value Ref Range   Glucose-Capillary 160 (H) 65 - 99 mg/dL  Glucose, capillary     Status: Abnormal   Collection Time: 03/12/16  9:41 AM  Result Value Ref Range   Glucose-Capillary 134 (H) 65 - 99 mg/dL  Glucose, capillary     Status: Abnormal   Collection Time: 03/12/16 10:37 AM  Result Value Ref Range   Glucose-Capillary 120 (H) 65 - 99 mg/dL  Glucose, capillary     Status: None   Collection Time: 03/12/16 11:36 AM  Result Value Ref Range   Glucose-Capillary 76 65 - 99 mg/dL  Glucose, capillary     Status: Abnormal   Collection Time: 03/12/16 12:38 PM  Result Value Ref Range   Glucose-Capillary 123 (H) 65 - 99 mg/dL  Glucose, capillary     Status: Abnormal   Collection Time: 03/12/16  1:41 PM  Result Value Ref Range   Glucose-Capillary 147 (H) 65 - 99 mg/dL     Assessment 1. Acute pancreatitis due to hypertriglyceridemia 2. Familial hypertriglyceridemia 3. Type 2 diabetes mellitus  Plan 1. Agree with treatment for severe hypertriglyceridemia including IV insulin, IVF and NPO status. 2. Increase rate of IV insulin to 12 units/hr 3. Stop D5 and replace with D10. Inc rate to 75 cc/hr. Adjust rate further as needed to keep sugars in the 100 - 180 range. 4. Monitor Finger stick blood sugars q1 hr. 5. Keep NPO until Triglycerides are <1000. 6. Monitor AM triglycerides daily. 7. Restart gemfibrozil 600 mg bid. 8. Once clinically improved, perhaps in 1-2 days then start statin. 9. Wait to restart metformin until he is eating. 10. Anticipate continuing IV  insulin until Triglycerides are <100. Would then transition straight from IV insulin back to insulin pump.    This was a complicated visit. Patient is ill and in the crtical care setting. Discussed case with my colleagues and nursing staff. Time spent with patient and coordinating care was >60 min.  I am unavailable to see patient over the upcoming weekend. Will follow again starting Monday 03/15/16.

## 2016-03-12 NOTE — Care Management (Addendum)
During progression discussed whether there was any compliance issues that would contribute to exac of his triglycerides. At present, unknown.  Maybe endocrine can add input.  Discussed outpatient [facility] referral for individual diabetes education and assessment.

## 2016-03-12 NOTE — Progress Notes (Signed)
Pharmacy Consult for Potassium Replacement Indication: Insulin drip for TG  Allergies  Allergen Reactions  . Codeine Anaphylaxis  . Ivp Dye [Iodinated Diagnostic Agents] Other (See Comments)    Kidneys stop working    Patient Measurements: Height: 5\' 10"  (177.8 cm) Weight: 251 lb 5.2 oz (114 kg) IBW/kg (Calculated) : 73  Vital Signs: Temp: 97.8 F (36.6 C) (06/02 1200) BP: 116/75 mmHg (06/02 1200) Pulse Rate: 66 (06/02 1200) Intake/Output from previous day: 06/01 0701 - 06/02 0700 In: 4372.6 [I.V.:4072.6; IV Piggyback:300] Out: 1775 [Urine:1775] Intake/Output from this shift: Total I/O In: 12.8 [I.V.:12.8] Out: 1475 [id number]  Labs:  Recent Labs  03/10/16 2346  WBC 6.2  HGB 13.1  HCT 38.3*  PLT 231     Recent Labs  03/10/16 2346 03/11/16 0451 03/11/16 1221 03/11/16 2023 03/12/16 0359 03/12/16 0400  NA 127*  --  128*  --   --  127*  K 3.1*  --  3.5 4.0  --  5.3*  CL 93*  --  95*  --   --  91*  CO2 24  --  24  --   --  27  GLUCOSE 188*  --  172*  --   --  205*  BUN 10  --  8  --   --  5*  CREATININE 0.50*  --  0.50*  --   --  0.80  CALCIUM 9.0  --  8.0*  --   --  7.9*  MG  --  4.4*  --   --   --   --   PROT 6.3*  --   --   --   --   --   ALBUMIN 4.8  --   --   --   --   --   AST 67*  --   --   --   --   --   ALT 51  --   --   --   --   --   ALKPHOS 82  --   --   --   --   --   BILITOT <0.1*  --   --   --   --   --   TRIG >5000*  --   --   --  >5000*  --   CHOLHDL 56.2  --   --   --   --   --   CHOL 899*  --   --   --   --   --    Estimated Creatinine Clearance: 158.3 mL/min (by C-G formula based on Cr of 0.8).    Recent Labs  03/12/16 1238 03/12/16 1341 03/12/16 1446  GLUCAP 123* 147* 143*    Medical History: Past Medical History  Diagnosis Date  . Recurrent acute pancreatitis     secondary to hypertriglyceridemia   . Gastric ulcer 2009  . Sleep apnea 1999    uses CPAP  . Chicken pox   . Diabetes mellitus   . HTN (hypertension)    . Familial hypertriglyceridemia     severe  . Morbid obesity ([place])     s/p bariatric sleeve surgery 01/2015  . Anxiety   . Depression     Medications:  Scheduled:  . docusate sodium  100 mg Oral BID  . enoxaparin (LOVENOX) injection  40 mg Subcutaneous Q24H  . finasteride  5 mg Oral Daily  . gemfibrozil  600 mg Oral BID AC  . pregabalin  300 mg Oral QHS  .  sertraline  50 mg Oral Daily  . sodium chloride flush  3 mL Intravenous Q12H  . tamsulosin  0.4 mg Oral Daily  . traZODone  100 mg Oral QHS   Infusions:  . dextrose 75 mL/hr at 03/12/16 1435  . insulin (NOVOLIN-R) infusion        Assessment: Pharmacy consulted to assist in managing potassium in this 40 y/o M with pancreatitis secondary to hypertriglyceridemia on insulin drip.   Plan:  K slightly elevated. Will f/u AM labs.    Ulice Dash D 03/12/2016,3:49 PM

## 2016-03-12 NOTE — Progress Notes (Signed)
[place] at [place] NAME: Sergio [name]    MR#:  [id number]  DATE OF BIRTH:  03-20-1976  SUBJECTIVE:Admitted for abdominal pain and acute pancreatitis,due to severe  hypertryglyceridemia. DKA. In the ICU, on insulin drip. Still has abdominal pain, nausea. BG 76 this am,now 143.on insulin drip and d10.  CHIEF COMPLAINT:   Chief Complaint  Patient presents with  . Abdominal Pain    REVIEW OF SYSTEMS:   Review of Systems  Gastrointestinal: Positive for abdominal pain.   CONSTITUTIONAL: No fever, fatigue or weakness.  EYES: No blurred or double vision.  EARS, NOSE, AND THROAT: No tinnitus or ear pain.  RESPIRATORY: No cough, shortness of breath, wheezing or hemoptysis.  CARDIOVASCULAR: No chest pain, orthopnea, edema.  GASTROINTESTINAL: Nausea, abdominal pain in the midepigastric region radiating to the back. GENITOURINARY: No dysuria, hematuria.  ENDOCRINE: No polyuria, nocturia,  HEMATOLOGY: No anemia, easy bruising or bleeding SKIN: No rash or lesion. MUSCULOSKELETAL: No joint pain or arthritis.   NEUROLOGIC: No tingling, numbness, weakness.  PSYCHIATRY: No anxiety or depression.   DRUG ALLERGIES:   Allergies  Allergen Reactions  . Codeine Anaphylaxis  . Ivp Dye [Iodinated Diagnostic Agents] Other (See Comments)    Kidneys stop working    VITALS:  Blood pressure 116/75, pulse 66, temperature 97.8 F (36.6 C), temperature source Oral, resp. rate 9, height 5\' 10"  (1.778 m), weight 114 kg (251 lb 5.2 oz), SpO2 99 %.  PHYSICAL EXAMINATION:  GENERAL:  40 y.o.-year-old patient lying in the bed with no acute distress.  EYES: Pupils equal, round, reactive to light and accommodation. No scleral icterus. Extraocular muscles intact.  HEENT: Head atraumatic, normocephalic. Oropharynx and nasopharynx clear.  NECK:  Supple, no jugular venous distention. No thyroid enlargement, no tenderness.  LUNGS: Normal breath sounds  bilaterally, no wheezing, rales,rhonchi or crepitation. No use of accessory muscles of respiration.  CARDIOVASCULAR: S1, S2 normal. No murmurs, rubs, or gallops.  ABDOMEN:Epigastric tenderness present .no rebound tenderness present. bowel sounds present EXTREMITIES: No pedal edema, cyanosis, or clubbing.  NEUROLOGIC: Cranial nerves II through XII are intact. Muscle strength 5/5 in all extremities. Sensation intact. Gait not checked.  PSYCHIATRIC: The patient is alert and oriented x 3.  SKIN: No obvious rash, lesion, or ulcer.    LABORATORY PANEL:   CBC  Recent Labs Lab 03/10/16 2346  WBC 6.2  HGB 13.1  HCT 38.3*  PLT 231   ------------------------------------------------------------------------------------------------------------------  Chemistries   Recent Labs Lab 03/10/16 2346 03/11/16 0451  03/12/16 0400  NA 127*  --   < > 127*  K 3.1*  --   < > 5.3*  CL 93*  --   < > 91*  CO2 24  --   < > 27  GLUCOSE 188*  --   < > 205*  BUN 10  --   < > 5*  CREATININE 0.50*  --   < > 0.80  CALCIUM 9.0  --   < > 7.9*  MG  --  4.4*  --   --   AST 67*  --   --   --   ALT 51  --   --   --   ALKPHOS 82  --   --   --   BILITOT <0.1*  --   --   --   < > = values in this interval not displayed. ------------------------------------------------------------------------------------------------------------------  Cardiac Enzymes  Recent Labs Lab 03/10/16 2346  TROPONINI <0.03   ------------------------------------------------------------------------------------------------------------------  RADIOLOGY:  No results found.  EKG:   Orders placed or performed during the hospital encounter of 03/11/16  . ED EKG  . ED EKG  . EKG 12-Lead  . EKG 12-Lead    ASSESSMENT AND PLAN:   Acute on chronic pancreatitis secondary to severe hypertriglyceridemia: Continue nothing by mouth, IV hydration, IV pain medication. Continue insulin drip till TG lessthan 100.andcontinue NPO till TG less  than 1000. For severe hypertryglyceridemia/continue iv insulin 12 units/hr,d10 at 75cc/hr. Check AM tryglycerides Hold statins,continue gemfibrozil Lipase normalized    Patient on multiple medications for high cholesterol including gemfibrozil, crestor.. #2 diabetes mellitus type 2:/acute pancreattis/;continue NPO,insulin drip #3 severe hypokalemia replaced the potassium. #4 severe hyponatremia secondary to severe hypertryglyceridemia .    All the records are reviewed and case discussed with Care Management/Social Workerr. Management plans discussed with the patient, family and they are in agreement.  CODE STATUS: full  TOTAL TIME TAKING CARE OF THIS PATIENT: 35 minutes.   POSSIBLE D/C IN  1-2DAYS, DEPENDING ON CLINICAL CONDITION.   Epifanio Lesches M.D on 03/12/2016 at 4:53 PM  Between 7am to 6pm - Pager - [phone]  After 6pm go to [email or web address] - password [email or web address] [facility]  Office  [phone]  CC: Primary care physician; Crecencio Mc, MD   Note: This dictation was prepared with Dragon dictation along with smaller phrase technology. Any transcriptional errors that result from this process are unintentional.

## 2016-03-12 NOTE — Progress Notes (Signed)
1800 Good day. Verbalized frustrations etc. Misses sons. Dad in to visit. Insulin drip changed to rate of 12units per hour.

## 2016-03-12 NOTE — Treatment Plan (Signed)
Endocrine note:  Pt currently receiving IV insulin at 19u/hour with BG 190's. On D10 50 mL/hr and D5 1/2 NS 150 mL/hr.   Discussed following recommendations with RN.  Stop D10 infusion.  Continue D5 1/2 NS at current rate.  Continue to titrate insulin infusion with gtt goal rate of 12u/hour and goal BG of 100-180 mg/dL.  Atha Starks, MD [facility] Endocrinology

## 2016-03-13 LAB — BASIC METABOLIC PANEL
Anion gap: 7 (ref 5–15)
CALCIUM: 8.3 mg/dL — AB (ref 8.9–10.3)
CO2: 26 mmol/L (ref 22–32)
CREATININE: 0.68 mg/dL (ref 0.61–1.24)
Chloride: 97 mmol/L — ABNORMAL LOW (ref 101–111)
GFR calc non Af Amer: >60 mL/min (ref >60)
GLUCOSE: 84 mg/dL (ref 65–99)
Potassium: 3.4 mmol/L — ABNORMAL LOW (ref 3.5–5.1)
Sodium: 130 mmol/L — ABNORMAL LOW (ref 135–145)

## 2016-03-13 LAB — GLUCOSE, CAPILLARY
Glucose-Capillary: 107 mg/dL — ABNORMAL HIGH (ref 65–99)
Glucose-Capillary: 113 mg/dL — ABNORMAL HIGH (ref 65–99)
Glucose-Capillary: 76 mg/dL (ref 65–99)
Glucose-Capillary: 78 mg/dL (ref 65–99)
Glucose-Capillary: 80 mg/dL (ref 65–99)
Glucose-Capillary: 83 mg/dL (ref 65–99)
Glucose-Capillary: 84 mg/dL (ref 65–99)
Glucose-Capillary: 85 mg/dL (ref 65–99)
Glucose-Capillary: 87 mg/dL (ref 65–99)
Glucose-Capillary: 87 mg/dL (ref 65–99)
Glucose-Capillary: 87 mg/dL (ref 65–99)
Glucose-Capillary: 87 mg/dL (ref 65–99)
Glucose-Capillary: 88 mg/dL (ref 65–99)
Glucose-Capillary: 90 mg/dL (ref 65–99)
Glucose-Capillary: 91 mg/dL (ref 65–99)
Glucose-Capillary: 93 mg/dL (ref 65–99)
Glucose-Capillary: 96 mg/dL (ref 65–99)
Glucose-Capillary: 97 mg/dL (ref 65–99)
Glucose-Capillary: 98 mg/dL (ref 65–99)

## 2016-03-13 LAB — CBC
HCT: 34.7 % — ABNORMAL LOW (ref 40.0–52.0)
Hemoglobin: 11.7 g/dL — ABNORMAL LOW (ref 13.0–18.0)
MCH: 26.8 pg (ref 26.0–34.0)
MCHC: 33.7 g/dL (ref 32.0–36.0)
MCV: 79.5 fL — AB (ref 80.0–100.0)
PLATELETS: 171 10*3/uL (ref 150–440)
RBC: 4.37 MIL/uL — AB (ref 4.40–5.90)
RDW: 16.2 % — AB (ref 11.5–14.5)
WBC: 3.7 10*3/uL — ABNORMAL LOW (ref 3.8–10.6)

## 2016-03-13 LAB — TRIGLYCERIDES: TRIGLYCERIDES: 4726 mg/dL — AB (ref <150)

## 2016-03-13 MED ORDER — POTASSIUM CHLORIDE CRYS ER 20 MEQ PO TBCR
20.0000 meq | EXTENDED_RELEASE_TABLET | Freq: Once | ORAL | Status: AC
  Administered 2016-03-13: 20 meq via ORAL
  Filled 2016-03-13: qty 1

## 2016-03-13 NOTE — Progress Notes (Signed)
[place] at [place] NAME: Sergio [name]    MRN#:  [id number]  DATE OF BIRTH:  Nov 10, 1975  SUBJECTIVE:  Hospital Day: 2 days Sergio [name] is a 40 y.o. male presenting with Abdominal Pain .   Overnight events: No overnight events Interval Events: Complains of anxiety, recurrent  REVIEW OF SYSTEMS:  CONSTITUTIONAL: No fever, fatigue or weakness.  EYES: No blurred or double vision.  EARS, NOSE, AND THROAT: No tinnitus or ear pain.  RESPIRATORY: No cough, shortness of breath, wheezing or hemoptysis.  CARDIOVASCULAR: No chest pain, orthopnea, edema.  GASTROINTESTINAL: No nausea, vomiting, diarrhea , positive abdominal pain.  GENITOURINARY: No dysuria, hematuria.  ENDOCRINE: No polyuria, nocturia,  HEMATOLOGY: No anemia, easy bruising or bleeding SKIN: No rash or lesion. MUSCULOSKELETAL: No joint pain or arthritis.   NEUROLOGIC: No tingling, numbness, weakness.  PSYCHIATRY: Positive anxiety denies depression.   DRUG ALLERGIES:   Allergies  Allergen Reactions  . Codeine Anaphylaxis  . Ivp Dye [Iodinated Diagnostic Agents] Other (See Comments)    Kidneys stop working    VITALS:  Blood pressure 114/71, pulse 65, temperature 96.9 F (36.1 C), temperature source Axillary, resp. rate 11, height 5\' 10"  (1.778 m), weight 251 lb 5.2 oz (114 kg), SpO2 94 %.  PHYSICAL EXAMINATION:  VITAL SIGNS: Filed Vitals:   03/13/16 0900 03/13/16 1000  BP: 103/76 114/71  Pulse: 66 65  Temp:    Resp: 9 11   GENERAL:40 y.o.male currently in no acute distress.  HEAD: Normocephalic, atraumatic.  EYES: Pupils equal, round, reactive to light. Extraocular muscles intact. No scleral icterus.  MOUTH: Moist mucosal membrane. Dentition intact. No abscess noted.  EAR, NOSE, THROAT: Clear without exudates. No external lesions.  NECK: Supple. No thyromegaly. No nodules. No JVD.  PULMONARY: Clear to ascultation, without wheeze rails or rhonci. No use of  accessory muscles, Good respiratory effort. good air entry bilaterally CHEST: Nontender to palpation.  CARDIOVASCULAR: S1 and S2. Regular rate and rhythm. No murmurs, rubs, or gallops. No edema. Pedal pulses 2+ bilaterally.  GASTROINTESTINAL: Soft, minimal tenderness on palpation without rebound or guarding, nondistended. No masses. Positive bowel sounds. No hepatosplenomegaly.  MUSCULOSKELETAL: No swelling, clubbing, or edema. Range of motion full in all extremities.  NEUROLOGIC: Cranial nerves II through XII are intact. No gross focal neurological deficits. Sensation intact. Reflexes intact.  SKIN: No ulceration, lesions, rashes, or cyanosis. Skin warm and dry. Turgor intact.  PSYCHIATRIC: Mood, affect within normal limits. The patient is awake, alert and oriented x 3. Insight, judgment intact.      LABORATORY PANEL:   CBC  Recent Labs Lab 03/13/16 0415  WBC 3.7*  HGB 11.7*  HCT 34.7*  PLT 171   ------------------------------------------------------------------------------------------------------------------  Chemistries   Recent Labs Lab 03/10/16 2346 03/11/16 0451  03/13/16 0415  NA 127*  --   < > 130*  K 3.1*  --   < > 3.4*  CL 93*  --   < > 97*  CO2 24  --   < > 26  GLUCOSE 188*  --   < > 84  BUN 10  --   < > <5*  CREATININE 0.50*  --   < > 0.68  CALCIUM 9.0  --   < > 8.3*  MG  --  4.4*  --   --   AST 67*  --   --   --   ALT 51  --   --   --  ALKPHOS 82  --   --   --   BILITOT <0.1*  --   --   --   < > = values in this interval not displayed. ------------------------------------------------------------------------------------------------------------------  Cardiac Enzymes  Recent Labs Lab 03/10/16 2346  TROPONINI <0.03   ------------------------------------------------------------------------------------------------------------------  RADIOLOGY:  No results found.  EKG:   Orders placed or performed during the hospital encounter of 03/11/16  . ED  EKG  . ED EKG  . EKG 12-Lead  . EKG 12-Lead    ASSESSMENT AND PLAN:   Sergio [name] is a 40 y.o. male presenting with Abdominal Pain . Admitted 03/11/2016 : Day #: 2 days  1. Acute on chronic pancreatitis secondary to severe hypertriglyceridemia: Nothing by mouth, IV fluids, pain medication, IV insulin until triglycerides less than 1000. Endocrinology input appreciated 2. diabetes mellitus type 2 insulin requiring: Continue insulin drip for hypertriglyceridemia, D10 as required to maintain glucose 3. hypokalemia replace the potassium. 4. Anxiety NOS: consult psych, continue zoloft   All the records are reviewed and case discussed with Care Management/Social Workerr. Management plans discussed with the patient, family and they are in agreement.  CODE STATUS: full TOTAL TIME TAKING CARE OF THIS PATIENT: 28 minutes.   POSSIBLE D/C IN 2-3DAYS, DEPENDING ON CLINICAL CONDITION.   Hower,  [name].D on 03/13/2016 at 1:05 PM  Between 7am to 6pm - Pager - [phone]  After 6pm: House Pager: - [facility]  Office  [phone]  CC: Primary care physician; Crecencio Mc, MD

## 2016-03-13 NOTE — Progress Notes (Signed)
Dr. Jannifer Franklin notified about pt's blood sugar of 84.  He ordered to increased pt's IVF to 180ml/h.

## 2016-03-13 NOTE — Progress Notes (Signed)
Pharmacy Consult for Potassium Replacement Indication: Insulin drip for TG  Allergies  Allergen Reactions  . Codeine Anaphylaxis  . Ivp Dye [Iodinated Diagnostic Agents] Other (See Comments)    Kidneys stop working    Patient Measurements: Height: 5\' 10"  (177.8 cm) Weight: 251 lb 5.2 oz (114 kg) IBW/kg (Calculated) : 73  Vital Signs: Temp: 98.6 F (37 C) (06/03 0000) Temp Source: Oral (06/03 0000) BP: 96/51 mmHg (06/03 0200) Pulse Rate: 67 (06/03 0200) Intake/Output from previous day: 06/02 0701 - 06/03 0700 In: 1566.8 [I.V.:1566.8] Out: 1975 [Urine:1975] Intake/Output from this shift: Total I/O In: 1554 [I.V.:1554] Out: -   Labs:  Recent Labs  03/10/16 2346 03/13/16 0415  WBC 6.2 3.7*  HGB 13.1 11.7*  HCT 38.3* 34.7*  PLT 231 171     Recent Labs  03/10/16 2346 03/11/16 0451 03/11/16 1221 03/11/16 2023 03/12/16 0359 03/12/16 0400 03/13/16 0415  NA 127*  --  128*  --   --  127* 130*  K 3.1*  --  3.5 4.0  --  5.3* 3.4*  CL 93*  --  95*  --   --  91* 97*  CO2 24  --  24  --   --  27 26  GLUCOSE 188*  --  172*  --   --  205* 84  BUN 10  --  8  --   --  5* <5*  CREATININE 0.50*  --  0.50*  --   --  0.80 0.68  CALCIUM 9.0  --  8.0*  --   --  7.9* 8.3*  MG  --  4.4*  --   --   --   --   --   PROT 6.3*  --   --   --   --   --   --   ALBUMIN 4.8  --   --   --   --   --   --   AST 67*  --   --   --   --   --   --   ALT 51  --   --   --   --   --   --   ALKPHOS 82  --   --   --   --   --   --   BILITOT <0.1*  --   --   --   --   --   --   TRIG >5000*  --   --   --  >5000*  --  4726*  CHOLHDL 56.2  --   --   --   --   --   --   CHOL 899*  --   --   --   --   --   --    Estimated Creatinine Clearance: 158.3 mL/min (by C-G formula based on Cr of 0.68).    Recent Labs  03/12/16 2313 03/13/16 0017 03/13/16 0106  GLUCAP 85 97 88    Medical History: Past Medical History  Diagnosis Date  . Recurrent acute pancreatitis     secondary to  hypertriglyceridemia   . Gastric ulcer 2009  . Sleep apnea 1999    uses CPAP  . Chicken pox   . Diabetes mellitus   . HTN (hypertension)   . Familial hypertriglyceridemia     severe  . Morbid obesity ([place])     s/p bariatric sleeve surgery 01/2015  . Anxiety   . Depression  Medications:  Scheduled:  . docusate sodium  100 mg Oral BID  . enoxaparin (LOVENOX) injection  40 mg Subcutaneous Q24H  . finasteride  5 mg Oral Daily  . gemfibrozil  600 mg Oral BID AC  . potassium chloride  20 mEq Oral Once  . pregabalin  300 mg Oral QHS  . sertraline  50 mg Oral Daily  . sodium chloride flush  3 mL Intravenous Q12H  . tamsulosin  0.4 mg Oral Daily  . traZODone  100 mg Oral QHS   Infusions:  . dextrose 75 mL/hr at 03/13/16 0231  . insulin (NOVOLIN-R) infusion 12 mL/hr at 03/13/16 0200      Assessment: Pharmacy consulted to assist in managing potassium in this 40 y/o M with pancreatitis secondary to hypertriglyceridemia on insulin drip.   Plan:  K slightly elevated. Will f/u AM labs.    6/3 AM K+ 3.4. 20 mEq PO x1 ordered. BMP tomorrow AM.  Avin Upperman S 03/13/2016,5:50 AM

## 2016-03-13 NOTE — Progress Notes (Signed)
Dr. Jannifer Franklin notified about pt's blood sugar of 78.  He ordered to increased pt's IVF to 152ml/h.

## 2016-03-14 DIAGNOSIS — F331 Major depressive disorder, recurrent, moderate: Secondary | ICD-10-CM

## 2016-03-14 LAB — GLUCOSE, CAPILLARY
Glucose-Capillary: 101 mg/dL — ABNORMAL HIGH (ref 65–99)
Glucose-Capillary: 102 mg/dL — ABNORMAL HIGH (ref 65–99)
Glucose-Capillary: 103 mg/dL — ABNORMAL HIGH (ref 65–99)
Glucose-Capillary: 104 mg/dL — ABNORMAL HIGH (ref 65–99)
Glucose-Capillary: 106 mg/dL — ABNORMAL HIGH (ref 65–99)
Glucose-Capillary: 113 mg/dL — ABNORMAL HIGH (ref 65–99)
Glucose-Capillary: 116 mg/dL — ABNORMAL HIGH (ref 65–99)
Glucose-Capillary: 127 mg/dL — ABNORMAL HIGH (ref 65–99)
Glucose-Capillary: 128 mg/dL — ABNORMAL HIGH (ref 65–99)
Glucose-Capillary: 128 mg/dL — ABNORMAL HIGH (ref 65–99)
Glucose-Capillary: 133 mg/dL — ABNORMAL HIGH (ref 65–99)
Glucose-Capillary: 140 mg/dL — ABNORMAL HIGH (ref 65–99)
Glucose-Capillary: 148 mg/dL — ABNORMAL HIGH (ref 65–99)
Glucose-Capillary: 74 mg/dL (ref 65–99)
Glucose-Capillary: 77 mg/dL (ref 65–99)
Glucose-Capillary: 81 mg/dL (ref 65–99)
Glucose-Capillary: 89 mg/dL (ref 65–99)
Glucose-Capillary: 91 mg/dL (ref 65–99)
Glucose-Capillary: 92 mg/dL (ref 65–99)
Glucose-Capillary: 93 mg/dL (ref 65–99)
Glucose-Capillary: 96 mg/dL (ref 65–99)
Glucose-Capillary: 99 mg/dL (ref 65–99)

## 2016-03-14 LAB — BASIC METABOLIC PANEL
Anion gap: 14 (ref 5–15)
CO2: 25 mmol/L (ref 22–32)
CREATININE: 0.75 mg/dL (ref 0.61–1.24)
Calcium: 9.1 mg/dL (ref 8.9–10.3)
Chloride: 96 mmol/L — ABNORMAL LOW (ref 101–111)
GFR calc Af Amer: >60 mL/min (ref >60)
GLUCOSE: 93 mg/dL (ref 65–99)
Potassium: 3.6 mmol/L (ref 3.5–5.1)
SODIUM: 135 mmol/L (ref 135–145)

## 2016-03-14 LAB — TRIGLYCERIDES: TRIGLYCERIDES: 3305 mg/dL — AB (ref <150)

## 2016-03-14 MED ORDER — TAMSULOSIN HCL 0.4 MG PO CAPS
0.8000 mg | ORAL_CAPSULE | Freq: Every day | ORAL | Status: DC
  Administered 2016-03-15 – 2016-03-19 (×5): 0.8 mg via ORAL
  Filled 2016-03-14 (×5): qty 2

## 2016-03-14 MED ORDER — SERTRALINE HCL 50 MG PO TABS
50.0000 mg | ORAL_TABLET | Freq: Once | ORAL | Status: AC
  Administered 2016-03-14: 50 mg via ORAL
  Filled 2016-03-14: qty 1

## 2016-03-14 MED ORDER — DEXTROSE 50 % IV SOLN
1.0000 | Freq: Once | INTRAVENOUS | Status: AC
  Administered 2016-03-14: 50 mL via INTRAVENOUS
  Filled 2016-03-14: qty 50

## 2016-03-14 MED ORDER — SERTRALINE HCL 100 MG PO TABS
100.0000 mg | ORAL_TABLET | Freq: Every day | ORAL | Status: DC
  Administered 2016-03-15: 100 mg via ORAL
  Filled 2016-03-14: qty 1

## 2016-03-14 NOTE — Progress Notes (Signed)
Hypoglycemia remains an issue -- d50 bolus x1, increase basal fluids - d10 24ml/h Monitor for volume overload, if patient ubale to tolerate this level of fluid or remains hypoglycemic will have to reduce insulin gtt rate

## 2016-03-14 NOTE — Consult Note (Signed)
[facility] Face-to-Face Psychiatry Consult   Reason for Consult:  Consult for 40 year old man with a history of anxiety and depression. Currently in the hospital for a bout of recurrent pancreatitis. Consult for anxiety. Referring Physician:  Hower Patient Identification: Sergio [name] MRN:  [id number] Principal Diagnosis: Depression, major, recurrent, moderate ([place]) Diagnosis:   Patient Active Problem List   Diagnosis Date Noted  . Depression, major, recurrent, moderate ([place]) [F33.1] 03/14/2016  . Pancreatitis [K85.9] 03/11/2016  . Abdominal pain [R10.9] 01/17/2016  . Sinusitis, acute maxillary [J01.00] 12/13/2015  . S/P bariatric surgery [Z98.84] 02/01/2015  . S/P laparoscopic cholecystectomy [Z90.49] 02/01/2015  . Diarrhea [R19.7] 05/29/2012  . Recurrent pancreatitis ([place]) [K86.1] 05/29/2012  . Generalized anxiety disorder [F41.1] 04/29/2012  . Obesity (BMI 30-39.9) [E66.9] 04/29/2012  . DM (diabetes mellitus), type 2, uncontrolled w/neurologic complication (HCC) [W10.27, E11.65]   . Hypertriglyceridemia [E78.1]   . HTN (hypertension) [I10]   . Sleep apnea [G47.30]     Total Time spent with patient: 1 hour  Subjective:   Sergio [name] is a 40 y.o. male patient admitted with "I've just been under a lot of stress".  HPI:  Patient interviewed. Chart reviewed. Labs and vitals reviewed. Patient is a 40 year old man who is in the hospital because of pancreatitis. Patient unfortunately suffers from intrinsic severe hyperlipidemia with chronically elevated triglycerides which caused him to have recurrent episodes of pancreatitis. As far as his emotional state the patient says he has been under a great deal of stress for the last 3-4 weeks. He focuses a lot of his complaint on his job. He feels that his bosses unreasonably demanding. Furthermore he feels that his job itself is unfulfilling. Also it takes up a great deal of time and the patient worries that he is not being adequately attentive at  home. Nevertheless he worries about finances. He says for the last 3 weeks he has been feeling more run down. Mood has been more negative. He feels tired all the time and doesn't want to interact with anyone. He denies any suicidal thoughts and denies any psychotic symptoms. He says he has been compliant with his medications. He is not currently seeing anyone for specific mental health treatment. Patient does not report any psychosis. He does not abuse substances. There is no sign of suicidality. He says his relationship with his wife and his parents is very good and supportive.  Medical history: Patient has chronically elevated triglycerides with recurrent bouts of pancreatitis. Currently in the hospital with pancreatitis again. Also has diabetes hypertension obesity  Social history: Lives with his wife and 3 young children ages 24,10 and 70. 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 Parents are living. Good relationship with them. Patient is adopted.  Substance abuse history: Does not drink does not abuse drugs nothing in the past chart to suggest any substance abuse problems  Past Psychiatric History: Patient has been treated for anxiety and depression chronically. Used to see a psychiatrist for both therapy and medicine management in [place]. Has not been going for quite some time. Patient says that he is still taking Zoloft daily. I have my suspicions as to whether he has been compliant. He quoted a dose of 100 mg to me which was what he was taking a few years ago. All of his recent notes however state that he is taking 50 mg a day. When I  called his outpatient pharmacy, [facility], they said they had never filled the prescription for him. I did not check with any other pharmacies and I didn't ask the patient which pharmacy he went to. No history of suicide attempts no history of violence no history of psychiatric  hospitalization  Risk to Self: Is patient at risk for suicide?: No Risk to Others:   Prior Inpatient Therapy:   Prior Outpatient Therapy:    Past Medical History:  Past Medical History  Diagnosis Date  . Recurrent acute pancreatitis     secondary to hypertriglyceridemia   . Gastric ulcer 2009  . Sleep apnea 1999    uses CPAP  . Chicken pox   . Diabetes mellitus   . HTN (hypertension)   . Familial hypertriglyceridemia     severe  . Morbid obesity ([place])     s/p bariatric sleeve surgery 01/2015  . Anxiety   . Depression     Past Surgical History  Procedure Laterality Date  . Tonsillectomy  2003  . Vasectomy    . Lumbar disc surgery  2008  . Cholecystectomy    . Laparoscopic gastric sleeve resection     Family History:  Family History  Problem Relation Age of Onset  . Adopted: Yes  . Hypertension Mother   . Hyperlipidemia Mother   . Hypertension Father   . Hyperlipidemia Father    Family Psychiatric  History: Patient does not know family history because he is adopted and does not know his biological family  Social History:  History  Alcohol Use No    Comment: once month     History  Drug Use No    Social History   Social History  . Marital Status: Married    Spouse Name: N/A  . Number of Children: 3  . Years of Education: N/A   Occupational History  . Corporate treasurer    Social History Main Topics  . Smoking status: Never Smoker   . Smokeless tobacco: Never Used  . Alcohol Use: No     Comment: once month  . Drug Use: No  . Sexual Activity: Not Asked   Other Topics Concern  . None   Social History Narrative   Daily Caffeine Use:  2-3 week/Soda   Regular Exercise -  NO   Additional Social History:    Allergies:   Allergies  Allergen Reactions  . Codeine Anaphylaxis  . Ivp Dye [Iodinated Diagnostic Agents] Other (See Comments)    Kidneys stop working    Labs:  Results for orders  placed or performed during the hospital encounter of 03/11/16 (from the past 48 hour(s))  Glucose, capillary     Status: Abnormal   Collection Time: 03/12/16  1:41 PM  Result Value Ref Range   Glucose-Capillary 147 (H) 65 - 99 mg/dL  Glucose, capillary     Status: Abnormal   Collection Time: 03/12/16  2:46 PM  Result Value Ref Range   Glucose-Capillary 143 (H) 65 - 99 mg/dL  Glucose, capillary     Status: Abnormal   Collection Time: 03/12/16  5:01 PM  Result Value Ref Range   Glucose-Capillary 112 (H) 65 - 99 mg/dL  Glucose, capillary     Status: Abnormal   Collection Time: 03/12/16  5:57 PM  Result Value Ref Range   Glucose-Capillary 117 (H) 65 - 99 mg/dL  Glucose, capillary     Status: Abnormal   Collection Time: 03/12/16  7:03 PM  Result Value Ref Range   Glucose-Capillary 114 (H) 65 - 99 mg/dL  Glucose, capillary     Status: None   Collection Time: 03/12/16  8:07 PM  Result Value Ref Range   Glucose-Capillary 71 65 - 99 mg/dL  Glucose, capillary     Status: Abnormal   Collection Time: 03/12/16  9:08 PM  Result Value Ref Range   Glucose-Capillary 126 (H) 65 - 99 mg/dL  Glucose, capillary     Status: None   Collection Time: 03/12/16 10:08 PM  Result Value Ref Range   Glucose-Capillary 99 65 - 99 mg/dL  Glucose, capillary     Status: None   Collection Time: 03/12/16 11:13 PM  Result Value Ref Range   Glucose-Capillary 85 65 - 99 mg/dL  Glucose, capillary     Status: None   Collection Time: 03/13/16 12:17 AM  Result Value Ref Range   Glucose-Capillary 97 65 - 99 mg/dL  Glucose, capillary     Status: None   Collection Time: 03/13/16  1:06 AM  Result Value Ref Range   Glucose-Capillary 88 65 - 99 mg/dL  Glucose, capillary     Status: None   Collection Time: 03/13/16  1:55 AM  Result Value Ref Range   Glucose-Capillary 87 65 - 99 mg/dL  Basic metabolic panel     Status: Abnormal   Collection Time: 03/13/16  4:15 AM  Result Value Ref Range   Sodium 130 (L) 135 - 145  mmol/L   Potassium 3.4 (L) 3.5 - 5.1 mmol/L    Comment: HEMOLYSIS AT THIS LEVEL MAY AFFECT RESULT   Chloride 97 (L) 101 - 111 mmol/L   CO2 26 22 - 32 mmol/L   Glucose, Bld 84 65 - 99 mg/dL   BUN <5 (L) 6 - 20 mg/dL   Creatinine, Ser 0.68 0.61 - 1.24 mg/dL   Calcium 8.3 (L) 8.9 - 10.3 mg/dL   GFR calc non Af Amer >60 >60 mL/min   GFR calc Af Amer >60 >60 mL/min    Comment: (NOTE) The eGFR has been calculated using the CKD EPI equation. This calculation has not been validated in all clinical situations. eGFR's persistently <60 mL/min signify possible Chronic Kidney Disease.    Anion gap 7 5 - 15  CBC     Status: Abnormal   Collection Time: 03/13/16  4:15 AM  Result Value Ref Range   WBC 3.7 (L) 3.8 - 10.6 K/uL   RBC 4.37 (L) 4.40 - 5.90 MIL/uL   Hemoglobin 11.7 (L) 13.0 - 18.0 g/dL    Comment: CORRECTED FOR LIPEMIA   HCT 34.7 (L) 40.0 - 52.0 %   MCV 79.5 (L) 80.0 - 100.0 fL   MCH 26.8 26.0 - 34.0 pg    Comment: CORRECTED FOR LIPEMIA   MCHC 33.7 32.0 - 36.0 g/dL    Comment: CORRECTED FOR LIPEMIA   RDW 16.2 (H) 11.5 - 14.5 %   Platelets 171 150 - 440 K/uL  Triglycerides     Status: Abnormal   Collection Time: 03/13/16  4:15 AM  Result Value Ref Range   Triglycerides 4726 (H) <150 mg/dL    Comment: RESULT CONFIRMED BY MANUAL DILUTION  Glucose, capillary     Status: None   Collection Time: 03/13/16  4:29 AM  Result Value Ref Range   Glucose-Capillary 93 65 - 99 mg/dL  Glucose, capillary     Status: None   Collection Time: 03/13/16  7:03 AM  Result Value Ref  Range   Glucose-Capillary 76 65 - 99 mg/dL  Glucose, capillary     Status: None   Collection Time: 03/13/16  8:39 AM  Result Value Ref Range   Glucose-Capillary 80 65 - 99 mg/dL  Glucose, capillary     Status: None   Collection Time: 03/13/16  9:36 AM  Result Value Ref Range   Glucose-Capillary 84 65 - 99 mg/dL  Glucose, capillary     Status: None   Collection Time: 03/13/16 10:24 AM  Result Value Ref Range    Glucose-Capillary 87 65 - 99 mg/dL  Glucose, capillary     Status: None   Collection Time: 03/13/16 11:31 AM  Result Value Ref Range   Glucose-Capillary 87 65 - 99 mg/dL  Glucose, capillary     Status: Abnormal   Collection Time: 03/13/16 12:34 PM  Result Value Ref Range   Glucose-Capillary 113 (H) 65 - 99 mg/dL  Glucose, capillary     Status: None   Collection Time: 03/13/16  1:42 PM  Result Value Ref Range   Glucose-Capillary 90 65 - 99 mg/dL  Glucose, capillary     Status: None   Collection Time: 03/13/16  2:37 PM  Result Value Ref Range   Glucose-Capillary 85 65 - 99 mg/dL  Glucose, capillary     Status: None   Collection Time: 03/13/16  3:31 PM  Result Value Ref Range   Glucose-Capillary 91 65 - 99 mg/dL  Glucose, capillary     Status: None   Collection Time: 03/13/16  4:46 PM  Result Value Ref Range   Glucose-Capillary 96 65 - 99 mg/dL  Glucose, capillary     Status: None   Collection Time: 03/13/16  6:31 PM  Result Value Ref Range   Glucose-Capillary 98 65 - 99 mg/dL  Glucose, capillary     Status: None   Collection Time: 03/13/16  7:38 PM  Result Value Ref Range   Glucose-Capillary 87 65 - 99 mg/dL   Comment 1 Notify RN   Glucose, capillary     Status: None   Collection Time: 03/13/16  8:44 PM  Result Value Ref Range   Glucose-Capillary 78 65 - 99 mg/dL   Comment 1 Notify RN   Glucose, capillary     Status: None   Collection Time: 03/13/16 10:12 PM  Result Value Ref Range   Glucose-Capillary 83 65 - 99 mg/dL   Comment 1 Notify RN   Glucose, capillary     Status: Abnormal   Collection Time: 03/13/16 11:10 PM  Result Value Ref Range   Glucose-Capillary 107 (H) 65 - 99 mg/dL   Comment 1 Notify RN   Glucose, capillary     Status: Abnormal   Collection Time: 03/14/16 12:16 AM  Result Value Ref Range   Glucose-Capillary 102 (H) 65 - 99 mg/dL   Comment 1 Notify RN   Glucose, capillary     Status: None   Collection Time: 03/14/16  1:13 AM  Result Value Ref  Range   Glucose-Capillary 99 65 - 99 mg/dL  Glucose, capillary     Status: Abnormal   Collection Time: 03/14/16  2:11 AM  Result Value Ref Range   Glucose-Capillary 103 (H) 65 - 99 mg/dL  Glucose, capillary     Status: Abnormal   Collection Time: 03/14/16  3:06 AM  Result Value Ref Range   Glucose-Capillary 101 (H) 65 - 99 mg/dL   Comment 1 Notify RN   Glucose, capillary     Status:  Abnormal   Collection Time: 03/14/16  4:00 AM  Result Value Ref Range   Glucose-Capillary 113 (H) 65 - 99 mg/dL   Comment 1 Notify RN   Glucose, capillary     Status: None   Collection Time: 03/14/16  5:25 AM  Result Value Ref Range   Glucose-Capillary 96 65 - 99 mg/dL  Triglycerides     Status: Abnormal   Collection Time: 03/14/16  5:43 AM  Result Value Ref Range   Triglycerides 3305 (H) <150 mg/dL    Comment: RESULT CONFIRMED BY MANUAL DILUTION  Basic metabolic panel     Status: Abnormal   Collection Time: 03/14/16  5:43 AM  Result Value Ref Range   Sodium 135 135 - 145 mmol/L    Comment: SPECIMEN WAS ULTRACENTRIFUGED DUE TO  LIPEMIC SPECIMEN, RESULTS MAY BE AFFECTED.    Potassium 3.6 3.5 - 5.1 mmol/L    Comment: SPECIMEN WAS ULTRACENTRIFUGED DUE TO LIPEMIC SPECIMEN, RESULTS MAY BE AFFECTED.    Chloride 96 (L) 101 - 111 mmol/L   CO2 25 22 - 32 mmol/L    Comment: SPECIMEN WAS ULTRACENTRIFUGED DUE TO LIPEMIC SPECIMEN, RESULTS MAY BE AFFECTED.    Glucose, Bld 93 65 - 99 mg/dL    Comment: SPECIMEN WAS ULTRACENTRIFUGED DUE TO  LIPEMIC SPECIMEN, RESULTS MAY BE AFFECTED.    BUN <5 (L) 6 - 20 mg/dL   Creatinine, Ser 0.75 0.61 - 1.24 mg/dL   Calcium 9.1 8.9 - 10.3 mg/dL    Comment: SPECIMEN WAS ULTRACENTRIFUGED DUE TO  LIPEMIC SPECIMEN, RESULTS MAY BE AFFECTED.    GFR calc non Af Amer >60 >60 mL/min   GFR calc Af Amer >60 >60 mL/min    Comment: (NOTE) The eGFR has been calculated using the CKD EPI equation. This calculation has not been validated in all clinical situations. eGFR's  persistently <60 mL/min signify possible Chronic Kidney Disease.    Anion gap 14 5 - 15    Comment: SPECIMEN WAS ULTRACENTRIFUGED DUE TO  LIPEMIC SPECIMEN, RESULTS MAY BE AFFECTED.   Glucose, capillary     Status: None   Collection Time: 03/14/16  6:58 AM  Result Value Ref Range   Glucose-Capillary 92 65 - 99 mg/dL  Glucose, capillary     Status: None   Collection Time: 03/14/16 12:20 PM  Result Value Ref Range   Glucose-Capillary 93 65 - 99 mg/dL    Current Facility-Administered Medications  Medication Dose Route Frequency Provider Last Rate Last Dose  . acetaminophen (TYLENOL) tablet 650 mg  650 mg Oral Q6H PRN Harrie Foreman, MD       Or  . acetaminophen (TYLENOL) suppository 650 mg  650 mg Rectal Q6H PRN Harrie Foreman, MD      . ALPRAZolam Duanne Moron) tablet 0.5 mg  0.5 mg Oral BID PRN Harrie Foreman, MD   0.5 mg at 03/14/16 1325  . dextrose 10 % infusion   Intravenous Continuous Lance Coon, MD 150 mL/hr at 03/14/16 1017    . dextrose 50 % solution 50 mL  1 ampule Intravenous Once Lytle Butte, MD      . docusate sodium (COLACE) capsule 100 mg  100 mg Oral BID Harrie Foreman, MD   100 mg at 03/13/16 0900  . enoxaparin (LOVENOX) injection 40 mg  40 mg Subcutaneous Q24H Harrie Foreman, MD   40 mg at 03/13/16 2132  . finasteride (PROSCAR) tablet 5 mg  5 mg Oral Daily Harrie Foreman, MD  5 mg at 03/14/16 0841  . gemfibrozil (LOPID) tablet 600 mg  600 mg Oral BID AC Judi Cong, MD   600 mg at 03/14/16 0841  . HYDROmorphone (DILAUDID) injection 2 mg  2 mg Intravenous Q3H PRN Harrie Foreman, MD   2 mg at 03/14/16 1140  . insulin regular (NOVOLIN R,HUMULIN R) 250 Units in sodium chloride 0.9 % 250 mL (1 Units/mL) infusion   Intravenous Continuous Judi Cong, MD 12 mL/hr at 03/14/16 0700    . ondansetron (ZOFRAN) tablet 4 mg  4 mg Oral Q6H PRN Harrie Foreman, MD       Or  . ondansetron [facility]) injection 4 mg  4 mg Intravenous Q6H PRN Harrie Foreman, MD   4  mg at 03/14/16 0523  . pregabalin (LYRICA) capsule 300 mg  300 mg Oral QHS Harrie Foreman, MD   300 mg at 03/13/16 2132  . [START ON 03/15/2016] sertraline (ZOLOFT) tablet 100 mg  100 mg Oral Daily John T Clapacs, MD      . sodium chloride flush (NS) 0.9 % injection 3 mL  3 mL Intravenous Q12H Harrie Foreman, MD   3 mL at 03/14/16 1000  . [START ON 03/15/2016] tamsulosin (FLOMAX) capsule 0.8 mg  0.8 mg Oral Daily Lytle Butte, MD      . traZODone (DESYREL) tablet 100 mg  100 mg Oral QHS Harrie Foreman, MD   100 mg at 03/13/16 2132    Musculoskeletal: Strength & Muscle Tone: within normal limits Gait & Station: ataxic Patient leans: N/A  Psychiatric Specialty Exam: Physical Exam  Nursing note and vitals reviewed. Constitutional: He appears well-developed and well-nourished.  HENT:  Head: Normocephalic and atraumatic.  Eyes: Conjunctivae are normal. Pupils are equal, round, and reactive to light.  Neck: Normal range of motion.  Cardiovascular: Regular rhythm and normal heart sounds.   Respiratory: Effort normal. No respiratory distress.  GI: Soft. There is tenderness. There is guarding.  Musculoskeletal: Normal range of motion.  Neurological: He is alert.  Skin: Skin is warm and dry.  Psychiatric: His speech is normal and behavior is normal. Judgment and thought content normal. His mood appears anxious. Cognition and memory are normal. He exhibits a depressed mood.    Review of Systems  Constitutional: Positive for malaise/fatigue.  HENT: Negative.   Eyes: Negative.   Respiratory: Negative.   Cardiovascular: Negative.   Gastrointestinal: Positive for vomiting and abdominal pain.  Musculoskeletal: Negative.   Skin: Negative.   Neurological: Negative.   Psychiatric/Behavioral: Positive for depression. Negative for suicidal ideas, hallucinations, memory loss and substance abuse. The patient is nervous/anxious. The patient does not have insomnia.     Blood pressure 131/90,  pulse 81, temperature 97.6 F (36.4 C), temperature source Axillary, resp. rate 25, height _0  (1.778 m), weight 117.7 kg (259 lb 7.7 oz), SpO2 99 %.Body mass index is 37.23 kg/(m^2).  General Appearance: Casual  Eye Contact:  Good  Speech:  Clear and Coherent  Volume:  Normal  Mood:  Anxious and Depressed  Affect:  Congruent  Thought Process:  Coherent  Orientation:  Full (Time, Place, and Person)  Thought Content:  Logical  Suicidal Thoughts:  No  Homicidal Thoughts:  No  Memory:  Immediate;   Good Recent;   Good Remote;   Good  Judgement:  Good  Insight:  Good  Psychomotor Activity:  Decreased  Concentration:  Concentration: Fair  Recall:  [place] of Knowledge:  Good  Language:  Good  Akathisia:  No  Handed:  Right  AIMS (if indicated):     Assets:  Communication Skills Desire for Improvement Financial Resources/Insurance Housing Resilience Social Support  ADL's:  Intact  Cognition:  WNL  Sleep:        Treatment Plan Summary: Daily contact with patient to assess and evaluate symptoms and progress in treatment, Medication management and Plan 40 year old man with a history of anxiety and depression. Recent symptoms sound to me like probably a moderate major depression. No dangerousness no suicidality no psychosis. Patient does not require hospital psychiatric inpatient treatment. I note that he is getting Zoloft at 50 mg a day but the patient told me that he thought the dose was 100 here in the hospital. I'm not sure what he is actually taking at home. I proposed to him before I reviewed his medicines that we go up 250 mg just based on his history. If however he is only been getting 50 mg recently I would rather just go up to 100. In any case I gave him an extra 50 mg a day and now have raised his standing dose to 100 mg. I strongly encouraged his idea about returning to see his therapist and Dr. outside the hospital. Spent some time doing supportive and cognitive  therapy. I will follow-up as needed.  Disposition: Patient does not meet criteria for psychiatric inpatient admission. Supportive therapy provided about ongoing stressors.  Alethia Berthold, MD 03/14/2016 1:33 PM

## 2016-03-14 NOTE — Progress Notes (Signed)
[facility] NAME: Sergio [name]    MRN#:  [id number]  DATE OF BIRTH:  [birth year]  SUBJECTIVE:  Hospital Day: 3 days Sergio [name] is a 40 y.o. male presenting with Abdominal Pain .   Overnight events: No overnight events Interval Events: States starting to have issues urinating  REVIEW OF SYSTEMS:  CONSTITUTIONAL: No fever, fatigue or weakness.  EYES: No blurred or double vision.  EARS, NOSE, AND THROAT: No tinnitus or ear pain.  RESPIRATORY: No cough, shortness of breath, wheezing or hemoptysis.  CARDIOVASCULAR: No chest pain, orthopnea, edema.  GASTROINTESTINAL: No nausea, vomiting, diarrhea , positive abdominal pain.  GENITOURINARY: No dysuria, hematuria.  ENDOCRINE: No polyuria, nocturia,  HEMATOLOGY: No anemia, easy bruising or bleeding SKIN: No rash or lesion. MUSCULOSKELETAL: No joint pain or arthritis.   NEUROLOGIC: No tingling, numbness, weakness.  PSYCHIATRY: Positive anxiety denies depression.   DRUG ALLERGIES:   Allergies  Allergen Reactions  . Codeine Anaphylaxis  . Ivp Dye [Iodinated Diagnostic Agents] Other (See Comments)    Kidneys stop working    VITALS:  Blood pressure 136/96, pulse 88, temperature 97.6 F (36.4 C), temperature source Axillary, resp. rate 16, height 5\' 10"  (1.778 m), weight 259 lb 7.7 oz (117.7 kg), SpO2 98 %.  PHYSICAL EXAMINATION:  VITAL SIGNS: Filed Vitals:   03/14/16 1100 03/14/16 1200  BP: 142/94 136/96  Pulse: 63 88  Temp:    Resp: 12 16   GENERAL:40 y.o.male currently in no acute distress.  HEAD: Normocephalic, atraumatic.  EYES: Pupils equal, round, reactive to light. Extraocular muscles intact. No scleral icterus.  MOUTH: Moist mucosal membrane. Dentition intact. No abscess noted.  EAR, NOSE, THROAT: Clear without exudates. No external lesions.  NECK: Supple. No thyromegaly. No nodules. No JVD.  PULMONARY: Clear to ascultation, without wheeze rails or rhonci.  No use of accessory muscles, Good respiratory effort. good air entry bilaterally CHEST: Nontender to palpation.  CARDIOVASCULAR: S1 and S2. Regular rate and rhythm. No murmurs, rubs, or gallops. No edema. Pedal pulses 2+ bilaterally.  GASTROINTESTINAL: Soft, minimal tenderness on palpation without rebound or guarding, nondistended. No masses. Positive bowel sounds. No hepatosplenomegaly.  MUSCULOSKELETAL: No swelling, clubbing, or edema. Range of motion full in all extremities.  NEUROLOGIC: Cranial nerves II through XII are intact. No gross focal neurological deficits. Sensation intact. Reflexes intact.  SKIN: No ulceration, lesions, rashes, or cyanosis. Skin warm and dry. Turgor intact.  PSYCHIATRIC: Mood, affect within normal limits. The patient is awake, alert and oriented x 3. Insight, judgment intact.      LABORATORY PANEL:   CBC  Recent Labs Lab 03/13/16 0415  WBC 3.7*  HGB 11.7*  HCT 34.7*  PLT 171   ------------------------------------------------------------------------------------------------------------------  Chemistries   Recent Labs Lab 03/10/16 2346 03/11/16 0451  03/14/16 0543  NA 127*  --   < > 135  K 3.1*  --   < > 3.6  CL 93*  --   < > 96*  CO2 24  --   < > 25  GLUCOSE 188*  --   < > 93  BUN 10  --   < > <5*  CREATININE 0.50*  --   < > 0.75  CALCIUM 9.0  --   < > 9.1  MG  --  4.4*  --   --   AST 67*  --   --   --   ALT 51  --   --   --  ALKPHOS 82  --   --   --   BILITOT <0.1*  --   --   --   < > = values in this interval not displayed. ------------------------------------------------------------------------------------------------------------------  Cardiac Enzymes  Recent Labs Lab 03/10/16 2346  TROPONINI <0.03   ------------------------------------------------------------------------------------------------------------------  RADIOLOGY:  No results found.  EKG:   Orders placed or performed during the hospital encounter of 03/11/16   . ED EKG  . ED EKG  . EKG 12-Lead  . EKG 12-Lead    ASSESSMENT AND PLAN:   Sergio [name] is a 40 y.o. male presenting with Abdominal Pain . Admitted 03/11/2016 : Day #: 3 days  1. Acute on chronic pancreatitis secondary to severe hypertriglyceridemia: Nothing by mouth, IV fluids, pain medication, IV insulin until triglycerides less than 1000. Endocrinology input appreciated 2. diabetes mellitus type 2 insulin requiring: Continue insulin drip for hypertriglyceridemia, D10 as required to maintain glucose 3. hypokalemia replace the potassium. 4. Anxiety NOS: consult psych, continue zoloft 5. Early urinary retention: Increase Flomax  All the records are reviewed and case discussed with Care Management/Social Workerr. Management plans discussed with the patient, family and they are in agreement.  CODE STATUS: full TOTAL TIME TAKING CARE OF THIS PATIENT: 28 minutes.   POSSIBLE D/C IN 2-3DAYS, DEPENDING ON CLINICAL CONDITION.   Sergio [name],  [name].D on 03/14/2016 at 12:43 PM  Between 7am to 6pm - Pager - [phone]  After 6pm: House Pager: - [phone]  [facility]  Office  [phone]  CC: Primary care physician; Crecencio Mc, MD

## 2016-03-14 NOTE — BH Assessment (Signed)
Writer informed psychiatrist (Dr. Clapacs) of consult. 

## 2016-03-14 NOTE — Progress Notes (Signed)
Pharmacy Consult for Potassium Replacement Indication: Insulin drip for TG  Allergies  Allergen Reactions  . Codeine Anaphylaxis  . Ivp Dye [Iodinated Diagnostic Agents] Other (See Comments)    Kidneys stop working    Patient Measurements: Height: 5\' 10"  (177.8 cm) Weight: 259 lb 7.7 oz (117.7 kg) IBW/kg (Calculated) : 73  Vital Signs: Temp: 97.6 F (36.4 C) (06/04 0200) Temp Source: Axillary (06/04 0200) BP: 136/96 mmHg (06/04 1200) Pulse Rate: 88 (06/04 1200) Intake/Output from previous day: 06/03 0701 - 06/04 0700 In: 2637.5 [I.V.:2637.5] Out: 801 [Urine:800; Stool:1] Intake/Output from this shift: Total I/O In: 727.9 [I.V.:727.9] Out: 580 [Urine:580]  Labs:  Recent Labs  03/13/16 0415  WBC 3.7*  HGB 11.7*  HCT 34.7*  PLT 171     Recent Labs  03/12/16 0359 03/12/16 0400 03/13/16 0415 03/14/16 0543  NA  --  127* 130* 135  K  --  5.3* 3.4* 3.6  CL  --  91* 97* 96*  CO2  --  27 26 25   GLUCOSE  --  205* 84 93  BUN  --  5* <5* <5*  CREATININE  --  0.80 0.68 0.75  CALCIUM  --  7.9* 8.3* 9.1  TRIG >5000*  --  4726* 3305*   Estimated Creatinine Clearance: 161 mL/min (by C-G formula based on Cr of 0.75).    Recent Labs  03/14/16 0525 03/14/16 0658 03/14/16 1220  GLUCAP 96 92 93    Medical History: Past Medical History  Diagnosis Date  . Recurrent acute pancreatitis     secondary to hypertriglyceridemia   . Gastric ulcer 2009  . Sleep apnea 1999    uses CPAP  . Chicken pox   . Diabetes mellitus   . HTN (hypertension)   . Familial hypertriglyceridemia     severe  . Morbid obesity ([place])     s/p bariatric sleeve surgery 01/2015  . Anxiety   . Depression     Medications:  Scheduled:  . docusate sodium  100 mg Oral BID  . enoxaparin (LOVENOX) injection  40 mg Subcutaneous Q24H  . finasteride  5 mg Oral Daily  . gemfibrozil  600 mg Oral BID AC  . pregabalin  300 mg Oral QHS  . [START ON 03/15/2016] sertraline  100 mg Oral Daily  . sodium  chloride flush  3 mL Intravenous Q12H  . tamsulosin  0.4 mg Oral Daily  . traZODone  100 mg Oral QHS   Infusions:  . dextrose 150 mL/hr at 03/14/16 1017  . insulin (NOVOLIN-R) infusion 12 mL/hr at 03/14/16 0700      Assessment: Pharmacy consulted to assist in managing potassium in this 40 y/o M with pancreatitis secondary to hypertriglyceridemia on insulin drip.   Plan:  K slightly elevated. Will f/u AM labs.    6/3 AM K+ 3.4. 20 mEq PO x1 ordered. BMP tomorrow AM. 6/4 AM K+ 3.6. No supplementation needed (note- this lab was sent to [facility] d/t lipid amt affecting result- and unable to run at [facility]). F/u am labs.  Shemeika Starzyk A 03/14/2016,12:39 PM

## 2016-03-15 LAB — GLUCOSE, CAPILLARY
Glucose-Capillary: 107 mg/dL — ABNORMAL HIGH (ref 65–99)
Glucose-Capillary: 108 mg/dL — ABNORMAL HIGH (ref 65–99)
Glucose-Capillary: 108 mg/dL — ABNORMAL HIGH (ref 65–99)
Glucose-Capillary: 113 mg/dL — ABNORMAL HIGH (ref 65–99)
Glucose-Capillary: 121 mg/dL — ABNORMAL HIGH (ref 65–99)
Glucose-Capillary: 123 mg/dL — ABNORMAL HIGH (ref 65–99)
Glucose-Capillary: 128 mg/dL — ABNORMAL HIGH (ref 65–99)
Glucose-Capillary: 129 mg/dL — ABNORMAL HIGH (ref 65–99)
Glucose-Capillary: 130 mg/dL — ABNORMAL HIGH (ref 65–99)
Glucose-Capillary: 131 mg/dL — ABNORMAL HIGH (ref 65–99)
Glucose-Capillary: 131 mg/dL — ABNORMAL HIGH (ref 65–99)
Glucose-Capillary: 133 mg/dL — ABNORMAL HIGH (ref 65–99)
Glucose-Capillary: 134 mg/dL — ABNORMAL HIGH (ref 65–99)
Glucose-Capillary: 139 mg/dL — ABNORMAL HIGH (ref 65–99)
Glucose-Capillary: 139 mg/dL — ABNORMAL HIGH (ref 65–99)
Glucose-Capillary: 142 mg/dL — ABNORMAL HIGH (ref 65–99)
Glucose-Capillary: 142 mg/dL — ABNORMAL HIGH (ref 65–99)
Glucose-Capillary: 145 mg/dL — ABNORMAL HIGH (ref 65–99)
Glucose-Capillary: 146 mg/dL — ABNORMAL HIGH (ref 65–99)
Glucose-Capillary: 151 mg/dL — ABNORMAL HIGH (ref 65–99)
Glucose-Capillary: 157 mg/dL — ABNORMAL HIGH (ref 65–99)

## 2016-03-15 LAB — BASIC METABOLIC PANEL
ANION GAP: 9 (ref 5–15)
CALCIUM: 8.4 mg/dL — AB (ref 8.9–10.3)
CO2: 30 mmol/L (ref 22–32)
CREATININE: 0.98 mg/dL (ref 0.61–1.24)
Chloride: 96 mmol/L — ABNORMAL LOW (ref 101–111)
GFR calc Af Amer: >60 mL/min (ref >60)
GLUCOSE: 152 mg/dL — AB (ref 65–99)
Potassium: 3.1 mmol/L — ABNORMAL LOW (ref 3.5–5.1)
Sodium: 135 mmol/L (ref 135–145)

## 2016-03-15 LAB — TRIGLYCERIDES: TRIGLYCERIDES: 1711 mg/dL — AB (ref <150)

## 2016-03-15 LAB — POTASSIUM: POTASSIUM: 3.2 mmol/L — AB (ref 3.5–5.1)

## 2016-03-15 MED ORDER — POTASSIUM CHLORIDE CRYS ER 20 MEQ PO TBCR
40.0000 meq | EXTENDED_RELEASE_TABLET | Freq: Once | ORAL | Status: AC
  Administered 2016-03-15: 40 meq via ORAL
  Filled 2016-03-15: qty 2

## 2016-03-15 MED ORDER — OMEGA-3-ACID ETHYL ESTERS 1 G PO CAPS
2.0000 g | ORAL_CAPSULE | Freq: Two times a day (BID) | ORAL | Status: DC
  Administered 2016-03-15 – 2016-03-19 (×8): 2 g via ORAL
  Filled 2016-03-15 (×9): qty 2

## 2016-03-15 MED ORDER — SERTRALINE HCL 50 MG PO TABS
150.0000 mg | ORAL_TABLET | Freq: Every day | ORAL | Status: DC
  Administered 2016-03-16 – 2016-03-19 (×4): 150 mg via ORAL
  Filled 2016-03-15 (×4): qty 1

## 2016-03-15 MED ORDER — DOCUSATE SODIUM 100 MG PO CAPS: 100.0000 mg | ORAL_CAPSULE | Freq: Two times a day (BID) | ORAL | Status: DC | PRN

## 2016-03-15 MED ORDER — ROSUVASTATIN CALCIUM 10 MG PO TABS
10.0000 mg | ORAL_TABLET | Freq: Every day | ORAL | Status: DC
  Administered 2016-03-15 – 2016-03-18 (×4): 10 mg via ORAL
  Filled 2016-03-15 (×4): qty 1

## 2016-03-15 NOTE — Progress Notes (Signed)
Pharmacy Consult for Potassium Replacement Indication: Insulin drip for TG  Allergies  Allergen Reactions  . Codeine Anaphylaxis  . Ivp Dye [Iodinated Diagnostic Agents] Other (See Comments)    Kidneys stop working    Patient Measurements: Height: 5\' 10"  (177.8 cm) Weight: 250 lb 7.1 oz (113.6 kg) IBW/kg (Calculated) : 73  Vital Signs: Temp: 97.9 F (36.6 C) (06/05 1400) Temp Source: Oral (06/05 1400) BP: 117/84 mmHg (06/05 1900) Pulse Rate: 75 (06/05 1900) Intake/Output from previous day: 06/04 0701 - 06/05 0700 In: 4635.9 [I.V.:4635.9] Out: 2805 [Urine:2805] Intake/Output from this shift:    Labs:  Recent Labs  03/13/16 0415  WBC 3.7*  HGB 11.7*  HCT 34.7*  PLT 171     Recent Labs  03/13/16 0415 03/14/16 0543 03/15/16 0525 03/15/16 1835  NA 130* 135 135  --   K 3.4* 3.6 3.1* 3.2*  CL 97* 96* 96*  --   CO2 26 25 30   --   GLUCOSE 84 93 152*  --   BUN <5* <5* <5*  --   CREATININE 0.68 0.75 0.98  --   CALCIUM 8.3* 9.1 8.4*  --   TRIG 4726* 3305* 1711*  --    Estimated Creatinine Clearance: 128.9 mL/min (by C-G formula based on Cr of 0.98).    Recent Labs  03/15/16 1718 03/15/16 1804 03/15/16 1907  GLUCAP 133* 113* 131*     Medications:  Scheduled:  . enoxaparin (LOVENOX) injection  40 mg Subcutaneous Q24H  . finasteride  5 mg Oral Daily  . gemfibrozil  600 mg Oral BID AC  . omega-3 acid ethyl esters  2 g Oral BID  . potassium chloride  40 mEq Oral Once  . pregabalin  300 mg Oral QHS  . rosuvastatin  10 mg Oral q1800  . [START ON 03/16/2016] sertraline  150 mg Oral Daily  . sodium chloride flush  3 mL Intravenous Q12H  . tamsulosin  0.8 mg Oral Daily  . traZODone  100 mg Oral QHS   Infusions:  . dextrose 200 mL/hr at 03/15/16 1728  . insulin (NOVOLIN-R) infusion 12 Units/hr (03/15/16 1200)      Assessment: Pharmacy consulted to assist in managing potassium in this 40 y/o M with pancreatitis secondary to hypertriglyceridemia on  insulin drip.   Potassium: 3.2  Plan: Will supplement with Potassium Chloride 40 meq po once, recheck with AM labs.    Adilee Lemme C 03/15/2016,7:40 PM

## 2016-03-15 NOTE — Progress Notes (Signed)
Pt started on Crestor this afternoon, continues on D 10 at 200 ml/hr and insulin gtt at 12. Maintaining sugars in the mid to low 100s. Continues to complain of abdominal pain. Medicated with Dilaudid.

## 2016-03-15 NOTE — Care Management (Signed)
Patient was sleeping when I rounded. He has declined [facility] referral per RN because "it don't work for him". His PCP is Dr. Derrel Nip and endocrinologist is Dr. Gabriel Carina. He is from home and independent.

## 2016-03-15 NOTE — Progress Notes (Signed)
Pharmacy Consult for Potassium Replacement Indication: Insulin drip for TG  Allergies  Allergen Reactions  . Codeine Anaphylaxis  . Ivp Dye [Iodinated Diagnostic Agents] Other (See Comments)    Kidneys stop working    Patient Measurements: Height: 5\' 10"  (177.8 cm) Weight: 250 lb 7.1 oz (113.6 kg) IBW/kg (Calculated) : 73  Vital Signs: Temp: 98.2 F (36.8 C) (06/05 0000) Temp Source: Oral (06/05 0000) BP: 115/78 mmHg (06/05 0700) Pulse Rate: 64 (06/05 0700) Intake/Output from previous day: 06/04 0701 - 06/05 0700 In: 4635.9 [I.V.:4635.9] Out: 2805 [Urine:2805] Intake/Output from this shift:    Labs:  Recent Labs  03/13/16 0415  WBC 3.7*  HGB 11.7*  HCT 34.7*  PLT 171     Recent Labs  03/13/16 0415 03/14/16 0543 03/15/16 0525  NA 130* 135 PENDING  K 3.4* 3.6 3.1*  CL 97* 96* 96*  CO2 26 25 30   GLUCOSE 84 93 152*  BUN <5* <5* <5*  CREATININE 0.68 0.75 0.98  CALCIUM 8.3* 9.1 8.4*  TRIG 4726* 3305* 1711*   Estimated Creatinine Clearance: 128.9 mL/min (by C-G formula based on Cr of 0.98).    Recent Labs  03/15/16 0435 03/15/16 0530 03/15/16 0651  GLUCAP 131* 142* 146*    Medical History: Past Medical History  Diagnosis Date  . Recurrent acute pancreatitis     secondary to hypertriglyceridemia   . Gastric ulcer 2009  . Sleep apnea 1999    uses CPAP  . Chicken pox   . Diabetes mellitus   . HTN (hypertension)   . Familial hypertriglyceridemia     severe  . Morbid obesity ([place])     s/p bariatric sleeve surgery 01/2015  . Anxiety   . Depression     Medications:  Scheduled:  . docusate sodium  100 mg Oral BID  . enoxaparin (LOVENOX) injection  40 mg Subcutaneous Q24H  . finasteride  5 mg Oral Daily  . gemfibrozil  600 mg Oral BID AC  . potassium chloride  40 mEq Oral Once  . pregabalin  300 mg Oral QHS  . sertraline  100 mg Oral Daily  . sodium chloride flush  3 mL Intravenous Q12H  . tamsulosin  0.8 mg Oral Daily  . traZODone  100  mg Oral QHS   Infusions:  . dextrose 200 mL/hr at 03/15/16 0725  . insulin (NOVOLIN-R) infusion 12 mL/hr at 03/15/16 0700      Assessment: Pharmacy consulted to assist in managing potassium in this 40 y/o M with pancreatitis secondary to hypertriglyceridemia on insulin drip.   Potassium: 3.1  Plan: Will supplement with Potassium Chloride 40 meq po once. Will recheck potassium level at 1800 today to assess need for further supplementation.     Geraldene Eisel G 03/15/2016,7:36 AM

## 2016-03-15 NOTE — Consult Note (Signed)
[facility] Face-to-Face Psychiatry Consult   Reason for Consult:  Consult for 40 year old man with a history of anxiety and depression. Currently in the hospital for a bout of recurrent pancreatitis. Consult for anxiety. Referring Physician:  Hower Patient Identification: Sergio [name] MRN:  [id number] Principal Diagnosis: Depression, major, recurrent, moderate ([facility]) Diagnosis:   Patient Active Problem List   Diagnosis Date Noted  . Depression, major, recurrent, moderate (Sergio [name]) [F33.1] 03/14/2016  . Pancreatitis [K85.9] 03/11/2016  . Abdominal pain [R10.9] 01/17/2016  . Sinusitis, acute maxillary [J01.00] 12/13/2015  . S/P bariatric surgery [Z98.84] 02/01/2015  . S/P laparoscopic cholecystectomy [Z90.49] 02/01/2015  . Diarrhea [R19.7] 05/29/2012  . Recurrent pancreatitis (Sergio [name]) [K86.1] 05/29/2012  . Generalized anxiety disorder [F41.1] 04/29/2012  . Obesity (BMI 30-39.9) [E66.9] 04/29/2012  . DM (diabetes mellitus), type 2, uncontrolled w/neurologic complication (HCC) [Y65.99, E11.65]   . Hypertriglyceridemia [E78.1]   . HTN (hypertension) [I10]   . Sleep apnea [G47.30]     Total Time spent with patient: 1 hour  Subjective:   Sergio [name] is a 40 y.o. male patient admitted with "I've just been under a lot of stress". Follow-up Monday the fifth. Patient has no new complaints. Pain is still significant but he is optimistic about getting better. Depression no worse. Anxiety no worse. No suicidal thoughts not somatic. Patient wants to clarify with me that he actually was taking 100 mg of Zoloft prior to hospitalization despite what his chart says.  HPI:  Patient interviewed. Chart reviewed. Labs and vitals reviewed. Patient is a 40 year old man who is in the hospital because of pancreatitis. Patient unfortunately suffers from intrinsic severe hyperlipidemia with chronically elevated triglycerides which caused him to have recurrent episodes of pancreatitis. As far as his emotional state the  patient says he has been under a great deal of stress for the last 3-4 weeks. He focuses a lot of his complaint on his job. He feels that his bosses unreasonably demanding. Furthermore he feels that his job itself is unfulfilling. Also it takes up a great deal of time and the patient worries that he is not being adequately attentive at home. Nevertheless he worries about finances. He says for the last 3 weeks he has been feeling more run down. Mood has been more negative. He feels tired all the time and doesn't want to interact with anyone. He denies any suicidal thoughts and denies any psychotic symptoms. He says he has been compliant with his medications. He is not currently seeing anyone for specific mental health treatment. Patient does not report any psychosis. He does not abuse substances. There is no sign of suicidality. He says his relationship with his wife and his parents is very good and supportive.  Medical history: Patient has chronically elevated triglycerides with recurrent bouts of pancreatitis. Currently in the hospital with pancreatitis again. Also has diabetes hypertension obesity  Social history: Lives with his wife and 3 young children ages 41,10 and 38. 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 Parents are living. Good relationship with them. Patient is adopted.  Substance abuse history: Does not drink does not abuse drugs nothing in the past chart to suggest any substance abuse problems  Past Psychiatric History: Patient has been treated for anxiety and depression chronically. Used to see a psychiatrist for both therapy and medicine management in [place]. Has not been going for quite some time. Patient says  that he is still taking Zoloft daily. I have my suspicions as to whether he has been compliant. He quoted a dose of 100 mg to me which was what he was taking a few years ago. All of his recent  notes however state that he is taking 50 mg a day. When I called his outpatient pharmacy, [facility], they said they had never filled the prescription for him. I did not check with any other pharmacies and I didn't ask the patient which pharmacy he went to. No history of suicide attempts no history of violence no history of psychiatric hospitalization  Risk to Self: Is patient at risk for suicide?: No Risk to Others:   Prior Inpatient Therapy:   Prior Outpatient Therapy:    Past Medical History:  Past Medical History  Diagnosis Date  . Recurrent acute pancreatitis     secondary to hypertriglyceridemia   . Gastric ulcer 2009  . Sleep apnea 1999    uses CPAP  . Chicken pox   . Diabetes mellitus   . HTN (hypertension)   . Familial hypertriglyceridemia     severe  . Morbid obesity ([place])     s/p bariatric sleeve surgery 01/2015  . Anxiety   . Depression     Past Surgical History  Procedure Laterality Date  . Tonsillectomy  2003  . Vasectomy    . Lumbar disc surgery  2008  . Cholecystectomy    . Laparoscopic gastric sleeve resection     Family History:  Family History  Problem Relation Age of Onset  . Adopted: Yes  . Hypertension Mother   . Hyperlipidemia Mother   . Hypertension Father   . Hyperlipidemia Father    Family Psychiatric  History: Patient does not know family history because he is adopted and does not know his biological family  Social History:  History  Alcohol Use No    Comment: once month     History  Drug Use No    Social History   Social History  . Marital Status: Married    Spouse Name: N/A  . Number of Children: 3  . Years of Education: N/A   Occupational History  . Corporate treasurer    Social History Main Topics  . Smoking status: Never Smoker   . Smokeless tobacco: Never Used  . Alcohol Use: No     Comment: once month  . Drug Use: No  . Sexual Activity: Not Asked   Other Topics  Concern  . None   Social History Narrative   Daily Caffeine Use:  2-3 week/Soda   Regular Exercise -  NO   Additional Social History:    Allergies:   Allergies  Allergen Reactions  . Codeine Anaphylaxis  . Ivp Dye [Iodinated Diagnostic Agents] Other (See Comments)    Kidneys stop working    Labs:  Results for orders placed or performed during the hospital encounter of 03/11/16 (from the past 48 hour(s))  Glucose, capillary     Status: None   Collection Time: 03/13/16  7:38 PM  Result Value Ref Range   Glucose-Capillary 87 65 - 99 mg/dL   Comment 1 Notify RN   Glucose, capillary     Status: None   Collection Time: 03/13/16  8:44 PM  Result Value Ref Range   Glucose-Capillary 78 65 - 99 mg/dL   Comment 1 Notify RN   Glucose, capillary     Status: None  Collection Time: 03/13/16 10:12 PM  Result Value Ref Range   Glucose-Capillary 83 65 - 99 mg/dL   Comment 1 Notify RN   Glucose, capillary     Status: Abnormal   Collection Time: 03/13/16 11:10 PM  Result Value Ref Range   Glucose-Capillary 107 (H) 65 - 99 mg/dL   Comment 1 Notify RN   Glucose, capillary     Status: Abnormal   Collection Time: 03/14/16 12:16 AM  Result Value Ref Range   Glucose-Capillary 102 (H) 65 - 99 mg/dL   Comment 1 Notify RN   Glucose, capillary     Status: None   Collection Time: 03/14/16  1:13 AM  Result Value Ref Range   Glucose-Capillary 99 65 - 99 mg/dL  Glucose, capillary     Status: Abnormal   Collection Time: 03/14/16  2:11 AM  Result Value Ref Range   Glucose-Capillary 103 (H) 65 - 99 mg/dL  Glucose, capillary     Status: Abnormal   Collection Time: 03/14/16  3:06 AM  Result Value Ref Range   Glucose-Capillary 101 (H) 65 - 99 mg/dL   Comment 1 Notify RN   Glucose, capillary     Status: Abnormal   Collection Time: 03/14/16  4:00 AM  Result Value Ref Range   Glucose-Capillary 113 (H) 65 - 99 mg/dL   Comment 1 Notify RN   Glucose, capillary     Status: None   Collection Time:  03/14/16  5:25 AM  Result Value Ref Range   Glucose-Capillary 96 65 - 99 mg/dL  Triglycerides     Status: Abnormal   Collection Time: 03/14/16  5:43 AM  Result Value Ref Range   Triglycerides 3305 (H) <150 mg/dL    Comment: RESULT CONFIRMED BY MANUAL DILUTION  Basic metabolic panel     Status: Abnormal   Collection Time: 03/14/16  5:43 AM  Result Value Ref Range   Sodium 135 135 - 145 mmol/L    Comment: SPECIMEN WAS ULTRACENTRIFUGED DUE TO  LIPEMIC SPECIMEN, RESULTS MAY BE AFFECTED.    Potassium 3.6 3.5 - 5.1 mmol/L    Comment: SPECIMEN WAS ULTRACENTRIFUGED DUE TO LIPEMIC SPECIMEN, RESULTS MAY BE AFFECTED.    Chloride 96 (L) 101 - 111 mmol/L   CO2 25 22 - 32 mmol/L    Comment: SPECIMEN WAS ULTRACENTRIFUGED DUE TO LIPEMIC SPECIMEN, RESULTS MAY BE AFFECTED.    Glucose, Bld 93 65 - 99 mg/dL    Comment: SPECIMEN WAS ULTRACENTRIFUGED DUE TO  LIPEMIC SPECIMEN, RESULTS MAY BE AFFECTED.    BUN <5 (L) 6 - 20 mg/dL   Creatinine, Ser 0.75 0.61 - 1.24 mg/dL   Calcium 9.1 8.9 - 10.3 mg/dL    Comment: SPECIMEN WAS ULTRACENTRIFUGED DUE TO  LIPEMIC SPECIMEN, RESULTS MAY BE AFFECTED.    GFR calc non Af Amer >60 >60 mL/min   GFR calc Af Amer >60 >60 mL/min    Comment: (NOTE) The eGFR has been calculated using the CKD EPI equation. This calculation has not been validated in all clinical situations. eGFR's persistently <60 mL/min signify possible Chronic Kidney Disease.    Anion gap 14 5 - 15    Comment: SPECIMEN WAS ULTRACENTRIFUGED DUE TO  LIPEMIC SPECIMEN, RESULTS MAY BE AFFECTED.   Glucose, capillary     Status: None   Collection Time: 03/14/16  6:58 AM  Result Value Ref Range   Glucose-Capillary 92 65 - 99 mg/dL  Glucose, capillary     Status: Abnormal   Collection Time:  03/14/16  7:59 AM  Result Value Ref Range   Glucose-Capillary 106 (H) 65 - 99 mg/dL  Glucose, capillary     Status: None   Collection Time: 03/14/16  9:12 AM  Result Value Ref Range   Glucose-Capillary 91 65  - 99 mg/dL  Glucose, capillary     Status: None   Collection Time: 03/14/16 10:11 AM  Result Value Ref Range   Glucose-Capillary 81 65 - 99 mg/dL  Glucose, capillary     Status: None   Collection Time: 03/14/16 11:36 AM  Result Value Ref Range   Glucose-Capillary 89 65 - 99 mg/dL  Glucose, capillary     Status: None   Collection Time: 03/14/16 12:20 PM  Result Value Ref Range   Glucose-Capillary 93 65 - 99 mg/dL  Glucose, capillary     Status: None   Collection Time: 03/14/16  1:21 PM  Result Value Ref Range   Glucose-Capillary 77 65 - 99 mg/dL  Glucose, capillary     Status: None   Collection Time: 03/14/16  2:20 PM  Result Value Ref Range   Glucose-Capillary 74 65 - 99 mg/dL  Glucose, capillary     Status: Abnormal   Collection Time: 03/14/16  3:50 PM  Result Value Ref Range   Glucose-Capillary 148 (H) 65 - 99 mg/dL  Glucose, capillary     Status: Abnormal   Collection Time: 03/14/16  4:31 PM  Result Value Ref Range   Glucose-Capillary 140 (H) 65 - 99 mg/dL  Glucose, capillary     Status: Abnormal   Collection Time: 03/14/16  5:24 PM  Result Value Ref Range   Glucose-Capillary 128 (H) 65 - 99 mg/dL  Glucose, capillary     Status: Abnormal   Collection Time: 03/14/16  6:22 PM  Result Value Ref Range   Glucose-Capillary 133 (H) 65 - 99 mg/dL  Glucose, capillary     Status: Abnormal   Collection Time: 03/14/16  7:28 PM  Result Value Ref Range   Glucose-Capillary 128 (H) 65 - 99 mg/dL  Glucose, capillary     Status: Abnormal   Collection Time: 03/14/16  8:28 PM  Result Value Ref Range   Glucose-Capillary 127 (H) 65 - 99 mg/dL  Glucose, capillary     Status: Abnormal   Collection Time: 03/14/16 10:34 PM  Result Value Ref Range   Glucose-Capillary 116 (H) 65 - 99 mg/dL  Glucose, capillary     Status: Abnormal   Collection Time: 03/14/16 11:35 PM  Result Value Ref Range   Glucose-Capillary 104 (H) 65 - 99 mg/dL  Glucose, capillary     Status: Abnormal   Collection  Time: 03/15/16 12:25 AM  Result Value Ref Range   Glucose-Capillary 107 (H) 65 - 99 mg/dL  Glucose, capillary     Status: Abnormal   Collection Time: 03/15/16  1:32 AM  Result Value Ref Range   Glucose-Capillary 108 (H) 65 - 99 mg/dL  Glucose, capillary     Status: Abnormal   Collection Time: 03/15/16  2:25 AM  Result Value Ref Range   Glucose-Capillary 121 (H) 65 - 99 mg/dL  Glucose, capillary     Status: Abnormal   Collection Time: 03/15/16  3:40 AM  Result Value Ref Range   Glucose-Capillary 123 (H) 65 - 99 mg/dL  Glucose, capillary     Status: Abnormal   Collection Time: 03/15/16  4:35 AM  Result Value Ref Range   Glucose-Capillary 131 (H) 65 - 99 mg/dL  Triglycerides  Status: Abnormal   Collection Time: 03/15/16  5:25 AM  Result Value Ref Range   Triglycerides 1711 (H) <150 mg/dL    Comment: RESULT CONFIRMED BY MANUAL DILUTION  Basic metabolic panel     Status: Abnormal   Collection Time: 03/15/16  5:25 AM  Result Value Ref Range   Sodium 135 135 - 145 mmol/L    Comment: POST-ULTRACENTRIFUGATION   Potassium 3.1 (L) 3.5 - 5.1 mmol/L   Chloride 96 (L) 101 - 111 mmol/L   CO2 30 22 - 32 mmol/L   Glucose, Bld 152 (H) 65 - 99 mg/dL   BUN <5 (L) 6 - 20 mg/dL   Creatinine, Ser 0.98 0.61 - 1.24 mg/dL   Calcium 8.4 (L) 8.9 - 10.3 mg/dL   GFR calc non Af Amer >60 >60 mL/min   GFR calc Af Amer >60 >60 mL/min    Comment: (NOTE) The eGFR has been calculated using the CKD EPI equation. This calculation has not been validated in all clinical situations. eGFR's persistently <60 mL/min signify possible Chronic Kidney Disease.    Anion gap 9 5 - 15    Comment: Performed at [facility]  Glucose, capillary     Status: Abnormal   Collection Time: 03/15/16  5:30 AM  Result Value Ref Range   Glucose-Capillary 142 (H) 65 - 99 mg/dL  Glucose, capillary     Status: Abnormal   Collection Time: 03/15/16  6:51 AM  Result Value Ref Range   Glucose-Capillary 146 (H) 65 - 99 mg/dL   Glucose, capillary     Status: Abnormal   Collection Time: 03/15/16  8:01 AM  Result Value Ref Range   Glucose-Capillary 157 (H) 65 - 99 mg/dL  Glucose, capillary     Status: Abnormal   Collection Time: 03/15/16  8:59 AM  Result Value Ref Range   Glucose-Capillary 139 (H) 65 - 99 mg/dL  Glucose, capillary     Status: Abnormal   Collection Time: 03/15/16  9:58 AM  Result Value Ref Range   Glucose-Capillary 142 (H) 65 - 99 mg/dL  Glucose, capillary     Status: Abnormal   Collection Time: 03/15/16 11:06 AM  Result Value Ref Range   Glucose-Capillary 139 (H) 65 - 99 mg/dL  Glucose, capillary     Status: Abnormal   Collection Time: 03/15/16 12:10 PM  Result Value Ref Range   Glucose-Capillary 145 (H) 65 - 99 mg/dL  Glucose, capillary     Status: Abnormal   Collection Time: 03/15/16  1:07 PM  Result Value Ref Range   Glucose-Capillary 151 (H) 65 - 99 mg/dL  Glucose, capillary     Status: Abnormal   Collection Time: 03/15/16  2:09 PM  Result Value Ref Range   Glucose-Capillary 129 (H) 65 - 99 mg/dL  Glucose, capillary     Status: Abnormal   Collection Time: 03/15/16  3:08 PM  Result Value Ref Range   Glucose-Capillary 128 (H) 65 - 99 mg/dL  Glucose, capillary     Status: Abnormal   Collection Time: 03/15/16  4:18 PM  Result Value Ref Range   Glucose-Capillary 130 (H) 65 - 99 mg/dL  Glucose, capillary     Status: Abnormal   Collection Time: 03/15/16  5:18 PM  Result Value Ref Range   Glucose-Capillary 133 (H) 65 - 99 mg/dL  Glucose, capillary     Status: Abnormal   Collection Time: 03/15/16  6:04 PM  Result Value Ref Range   Glucose-Capillary 113 (H) 65 -  99 mg/dL    Current Facility-Administered Medications  Medication Dose Route Frequency Provider Last Rate Last Dose  . acetaminophen (TYLENOL) tablet 650 mg  650 mg Oral Q6H PRN Harrie Foreman, MD       Or  . acetaminophen (TYLENOL) suppository 650 mg  650 mg Rectal Q6H PRN Harrie Foreman, MD      . ALPRAZolam  Duanne Moron) tablet 0.5 mg  0.5 mg Oral BID PRN Harrie Foreman, MD   0.5 mg at 03/14/16 1325  . dextrose 10 % infusion   Intravenous Continuous Lytle Butte, MD 200 mL/hr at 03/15/16 1728    . docusate sodium (COLACE) capsule 100 mg  100 mg Oral BID PRN Vishal Mungal, MD      . enoxaparin (LOVENOX) injection 40 mg  40 mg Subcutaneous Q24H Harrie Foreman, MD   40 mg at 03/14/16 2228  . finasteride (PROSCAR) tablet 5 mg  5 mg Oral Daily Harrie Foreman, MD   5 mg at 03/15/16 0912  . gemfibrozil (LOPID) tablet 600 mg  600 mg Oral BID AC Judi Cong, MD   600 mg at 03/15/16 1735  . HYDROmorphone (DILAUDID) injection 2 mg  2 mg Intravenous Q3H PRN Harrie Foreman, MD   2 mg at 03/15/16 1837  . insulin regular (NOVOLIN R,HUMULIN R) 250 Units in sodium chloride 0.9 % 250 mL (1 Units/mL) infusion   Intravenous Continuous Judi Cong, MD 12 mL/hr at 03/15/16 1200 12 Units/hr at 03/15/16 1200  . omega-3 acid ethyl esters (LOVAZA) capsule 2 g  2 g Oral BID Judi Cong, MD   2 g at 03/15/16 1457  . ondansetron (ZOFRAN) tablet 4 mg  4 mg Oral Q6H PRN Harrie Foreman, MD       Or  . ondansetron [facility]) injection 4 mg  4 mg Intravenous Q6H PRN Harrie Foreman, MD   4 mg at 03/15/16 1536  . pregabalin (LYRICA) capsule 300 mg  300 mg Oral QHS Harrie Foreman, MD   300 mg at 03/14/16 2228  . rosuvastatin (CRESTOR) tablet 10 mg  10 mg Oral q1800 Judi Cong, MD   10 mg at 03/15/16 1735  . [START ON 03/16/2016] sertraline (ZOLOFT) tablet 150 mg  150 mg Oral Daily John T Clapacs, MD      . sodium chloride flush (NS) 0.9 % injection 3 mL  3 mL Intravenous Q12H Harrie Foreman, MD   3 mL at 03/15/16 0912  . tamsulosin (FLOMAX) capsule 0.8 mg  0.8 mg Oral Daily Lytle Butte, MD   0.8 mg at 03/15/16 0912  . traZODone (DESYREL) tablet 100 mg  100 mg Oral QHS Harrie Foreman, MD   100 mg at 03/14/16 2228    Musculoskeletal: Strength & Muscle Tone: within normal limits Gait & Station: ataxic Patient  leans: N/A  Psychiatric Specialty Exam: Physical Exam  Nursing note and vitals reviewed. Constitutional: He appears well-developed and well-nourished.  HENT:  Head: Normocephalic and atraumatic.  Eyes: Conjunctivae are normal. Pupils are equal, round, and reactive to light.  Neck: Normal range of motion.  Cardiovascular: Regular rhythm and normal heart sounds.   Respiratory: Effort normal. No respiratory distress.  GI: Soft. There is tenderness. There is guarding.  Musculoskeletal: Normal range of motion.  Neurological: He is alert.  Skin: Skin is warm and dry.  Psychiatric: His speech is normal and behavior is normal. Judgment and thought content normal. His mood  appears anxious. Cognition and memory are normal. He exhibits a depressed mood.    Review of Systems  Constitutional: Positive for malaise/fatigue.  HENT: Negative.   Eyes: Negative.   Respiratory: Negative.   Cardiovascular: Negative.   Gastrointestinal: Positive for vomiting and abdominal pain.  Musculoskeletal: Negative.   Skin: Negative.   Neurological: Negative.   Psychiatric/Behavioral: Positive for depression. Negative for suicidal ideas, hallucinations, memory loss and substance abuse. The patient is nervous/anxious. The patient does not have insomnia.     Blood pressure 126/83, pulse 71, temperature 97.9 F (36.6 C), temperature source Oral, resp. rate 12, height _0  (1.778 m), weight 113.6 kg (250 lb 7.1 oz), SpO2 95 %.Body mass index is 35.93 kg/(m^2).  General Appearance: Casual  Eye Contact:  Good  Speech:  Clear and Coherent  Volume:  Normal  Mood:  Anxious and Depressed  Affect:  Congruent  Thought Process:  Coherent  Orientation:  Full (Time, Place, and Person)  Thought Content:  Logical  Suicidal Thoughts:  No  Homicidal Thoughts:  No  Memory:  Immediate;   Good Recent;   Good Remote;   Good  Judgement:  Good  Insight:  Good  Psychomotor Activity:  Decreased  Concentration:   Concentration: Fair  Recall:  [place] of Knowledge:  Good  Language:  Good  Akathisia:  No  Handed:  Right  AIMS (if indicated):     Assets:  Communication Skills Desire for Improvement Financial Resources/Insurance Housing Resilience Social Support  ADL's:  Intact  Cognition:  WNL  Sleep:        Treatment Plan Summary: Daily contact with patient to assess and evaluate symptoms and progress in treatment, Medication management and Plan Based on what he is telling me today I will go ahead and increase the Zoloft dose 1250 mg a day for anxiety and depression. Supportive counseling completed. No other change to treatment plan.  Disposition: Patient does not meet criteria for psychiatric inpatient admission. Supportive therapy provided about ongoing stressors.  Alethia Berthold, MD 03/15/2016 6:44 PM

## 2016-03-15 NOTE — Progress Notes (Signed)
Endocrinology follow up  REQUESTING PROVIDER: Laverle Hobby, MD CONSULTING PROVIDER:  Lavone Orn, MD  History of present illness Sergio [name] is a 40 y.o. male seen in follow up for acute pancreatitis due to hypertriglyceridemia. He complains of persistent abd pain, some improved over last few days. Still no appetite. No N/V.   Currently on IV insulin 12 units/hr along with D10 200 cc/hr. Serum triglycerides remain high at 1711. Sugars have been in the 129-151 range. He remains NPO.  Medical history Past Medical History  Diagnosis Date  . Recurrent acute pancreatitis     secondary to hypertriglyceridemia   . Gastric ulcer 2009  . Sleep apnea 1999    uses CPAP  . Chicken pox   . Diabetes mellitus   . HTN (hypertension)   . Familial hypertriglyceridemia     severe  . Morbid obesity ([place])     s/p bariatric sleeve surgery 01/2015  . Anxiety   . Depression     Surgical history Past Surgical History  Procedure Laterality Date  . Tonsillectomy  2003  . Vasectomy    . Lumbar disc surgery  2008  . Cholecystectomy    . Laparoscopic gastric sleeve resection      Medications . docusate sodium  100 mg Oral BID  . enoxaparin (LOVENOX) injection  40 mg Subcutaneous Q24H  . finasteride  5 mg Oral Daily  . gemfibrozil  600 mg Oral BID AC  . pregabalin  300 mg Oral QHS  . sertraline  100 mg Oral Daily  . sodium chloride flush  3 mL Intravenous Q12H  . tamsulosin  0.8 mg Oral Daily  . traZODone  100 mg Oral QHS    Social history Social History  Substance Use Topics  . Smoking status: Never Smoker   . Smokeless tobacco: Never Used  . Alcohol Use: No     Comment: once month    Physical Exam BP 126/86 mmHg  Pulse 75  Temp(Src) 98.3 F (36.8 C) (Oral)  Resp 8  Ht 5\' 10"  (1.778 m)  Wt 113.6 kg (250 lb 7.1 oz)  BMI 35.93 kg/m2  SpO2 94%  GEN: Overweight white male, in NAD.  HEENT: No proptosis, EOMI, lid lag or stare. Oropharynx is clear.  NECK: supple,  trachea midline.   RESPIRATORY: clear bilaterally, no wheeze, good inspiratory effort. CV: No carotid bruits, RRR. MUSCULOSKELETAL: normal gait and station, no clubbing, no tremor ABD: soft, +epigastric TTP EXT: no peripheral edema SKIN: no dermatopathy or rash or acanthosis nigricans. No xanthomas or xanthomatas. LYMPH: no submandibular or supraclavicular LAD NEURO: PERRL. PSYC: alert and oriented, good insight  Labs Results for orders placed or performed during the hospital encounter of 03/11/16 (from the past 24 hour(s))  Glucose, capillary     Status: Abnormal   Collection Time: 03/14/16  3:50 PM  Result Value Ref Range   Glucose-Capillary 148 (H) 65 - 99 mg/dL  Glucose, capillary     Status: Abnormal   Collection Time: 03/14/16  4:31 PM  Result Value Ref Range   Glucose-Capillary 140 (H) 65 - 99 mg/dL  Glucose, capillary     Status: Abnormal   Collection Time: 03/14/16  5:24 PM  Result Value Ref Range   Glucose-Capillary 128 (H) 65 - 99 mg/dL  Glucose, capillary     Status: Abnormal   Collection Time: 03/14/16  6:22 PM  Result Value Ref Range   Glucose-Capillary 133 (H) 65 - 99 mg/dL  Glucose, capillary  Status: Abnormal   Collection Time: 03/14/16  7:28 PM  Result Value Ref Range   Glucose-Capillary 128 (H) 65 - 99 mg/dL  Glucose, capillary     Status: Abnormal   Collection Time: 03/14/16  8:28 PM  Result Value Ref Range   Glucose-Capillary 127 (H) 65 - 99 mg/dL  Glucose, capillary     Status: Abnormal   Collection Time: 03/14/16 10:34 PM  Result Value Ref Range   Glucose-Capillary 116 (H) 65 - 99 mg/dL  Glucose, capillary     Status: Abnormal   Collection Time: 03/14/16 11:35 PM  Result Value Ref Range   Glucose-Capillary 104 (H) 65 - 99 mg/dL  Glucose, capillary     Status: Abnormal   Collection Time: 03/15/16 12:25 AM  Result Value Ref Range   Glucose-Capillary 107 (H) 65 - 99 mg/dL  Glucose, capillary     Status: Abnormal   Collection Time: 03/15/16   1:32 AM  Result Value Ref Range   Glucose-Capillary 108 (H) 65 - 99 mg/dL  Glucose, capillary     Status: Abnormal   Collection Time: 03/15/16  2:25 AM  Result Value Ref Range   Glucose-Capillary 121 (H) 65 - 99 mg/dL  Glucose, capillary     Status: Abnormal   Collection Time: 03/15/16  3:40 AM  Result Value Ref Range   Glucose-Capillary 123 (H) 65 - 99 mg/dL  Glucose, capillary     Status: Abnormal   Collection Time: 03/15/16  4:35 AM  Result Value Ref Range   Glucose-Capillary 131 (H) 65 - 99 mg/dL  Triglycerides     Status: Abnormal   Collection Time: 03/15/16  5:25 AM  Result Value Ref Range   Triglycerides 1711 (H) <150 mg/dL  Basic metabolic panel     Status: Abnormal   Collection Time: 03/15/16  5:25 AM  Result Value Ref Range   Sodium 135 135 - 145 mmol/L   Potassium 3.1 (L) 3.5 - 5.1 mmol/L   Chloride 96 (L) 101 - 111 mmol/L   CO2 30 22 - 32 mmol/L   Glucose, Bld 152 (H) 65 - 99 mg/dL   BUN <5 (L) 6 - 20 mg/dL   Creatinine, Ser 0.98 0.61 - 1.24 mg/dL   Calcium 8.4 (L) 8.9 - 10.3 mg/dL   GFR calc non Af Amer >60 >60 mL/min   GFR calc Af Amer >60 >60 mL/min   Anion gap 9 5 - 15  Glucose, capillary     Status: Abnormal   Collection Time: 03/15/16  5:30 AM  Result Value Ref Range   Glucose-Capillary 142 (H) 65 - 99 mg/dL  Glucose, capillary     Status: Abnormal   Collection Time: 03/15/16  6:51 AM  Result Value Ref Range   Glucose-Capillary 146 (H) 65 - 99 mg/dL  Glucose, capillary     Status: Abnormal   Collection Time: 03/15/16  8:01 AM  Result Value Ref Range   Glucose-Capillary 157 (H) 65 - 99 mg/dL  Glucose, capillary     Status: Abnormal   Collection Time: 03/15/16  8:59 AM  Result Value Ref Range   Glucose-Capillary 139 (H) 65 - 99 mg/dL  Glucose, capillary     Status: Abnormal   Collection Time: 03/15/16  9:58 AM  Result Value Ref Range   Glucose-Capillary 142 (H) 65 - 99 mg/dL  Glucose, capillary     Status: Abnormal   Collection Time: 03/15/16  11:06 AM  Result Value Ref Range   Glucose-Capillary 139 (  H) 65 - 99 mg/dL  Glucose, capillary     Status: Abnormal   Collection Time: 03/15/16 12:10 PM  Result Value Ref Range   Glucose-Capillary 145 (H) 65 - 99 mg/dL  Glucose, capillary     Status: Abnormal   Collection Time: 03/15/16  1:07 PM  Result Value Ref Range   Glucose-Capillary 151 (H) 65 - 99 mg/dL  Glucose, capillary     Status: Abnormal   Collection Time: 03/15/16  2:09 PM  Result Value Ref Range   Glucose-Capillary 129 (H) 65 - 99 mg/dL    Assessment 1. Acute pancreatitis due to hypertriglyceridemia 2. Familial hypertriglyceridemia 3. Type 2 diabetes mellitus  Plan Triglycerides improving. Continue rate of IV insulin at 12 units/hr. Continue D10 at 200 cc/hr. No exam findings to suggest fluid overload. Keep NPO and continue to monitor finger stick blood sugars q1 hr. Plan to keep him NPO until Triglycerides are <1000. Monitor AM triglycerides daily.   Continue gemfibrozil 600 mg bid. Will add fish oil 4g daily and Crestor 10 mg daily.   Wait to restart metformin until he is eating.  Will follow along with you.

## 2016-03-15 NOTE — Progress Notes (Signed)
[facility] [place] NAME: Sergio [name]    MRN#:  [id number]  DATE OF BIRTH:  Aug 15, 1976  SUBJECTIVE:  Hospital Day: 4 days Sergio [name] is a 40 y.o. male presenting with Abdominal Pain .   Overnight events: No overnight events Interval Events:No current issues  REVIEW OF SYSTEMS:  CONSTITUTIONAL: No fever, fatigue or weakness.  EYES: No blurred or double vision.  EARS, NOSE, AND THROAT: No tinnitus or ear pain.  RESPIRATORY: No cough, shortness of breath, wheezing or hemoptysis.  CARDIOVASCULAR: No chest pain, orthopnea, edema.  GASTROINTESTINAL: No nausea, vomiting, diarrhea , positive abdominal pain.  GENITOURINARY: No dysuria, hematuria.  ENDOCRINE: No polyuria, nocturia,  HEMATOLOGY: No anemia, easy bruising or bleeding SKIN: No rash or lesion. MUSCULOSKELETAL: No joint pain or arthritis.   NEUROLOGIC: No tingling, numbness, weakness.  PSYCHIATRY: Positive anxiety denies depression.   DRUG ALLERGIES:   Allergies  Allergen Reactions  . Codeine Anaphylaxis  . Ivp Dye [Iodinated Diagnostic Agents] Other (See Comments)    Kidneys stop working    VITALS:  Blood pressure 124/79, pulse 71, temperature 98.3 F (36.8 C), temperature source Oral, resp. rate 14, height 5\' 10"  (1.778 m), weight 250 lb 7.1 oz (113.6 kg), SpO2 98 %.  PHYSICAL EXAMINATION:  VITAL SIGNS: Filed Vitals:   03/15/16 1400 03/15/16 1500  BP: 126/86 124/79  Pulse: 75 71  Temp:    Resp: 8 14   GENERAL:40 y.o.male currently in no acute distress.  HEAD: Normocephalic, atraumatic.  EYES: Pupils equal, round, reactive to light. Extraocular muscles intact. No scleral icterus.  MOUTH: Moist mucosal membrane. Dentition intact. No abscess noted.  EAR, NOSE, THROAT: Clear without exudates. No external lesions.  NECK: Supple. No thyromegaly. No nodules. No JVD.  PULMONARY: Clear to ascultation, without wheeze rails or rhonci. No use of accessory muscles,  Good respiratory effort. good air entry bilaterally CHEST: Nontender to palpation.  CARDIOVASCULAR: S1 and S2. Regular rate and rhythm. No murmurs, rubs, or gallops. No edema. Pedal pulses 2+ bilaterally.  GASTROINTESTINAL: Soft, minimal tenderness on palpation without rebound or guarding, nondistended. No masses. Positive bowel sounds. No hepatosplenomegaly.  MUSCULOSKELETAL: No swelling, clubbing, or edema. Range of motion full in all extremities.  NEUROLOGIC: Cranial nerves II through XII are intact. No gross focal neurological deficits. Sensation intact. Reflexes intact.  SKIN: No ulceration, lesions, rashes, or cyanosis. Skin warm and dry. Turgor intact.  PSYCHIATRIC: Mood, affect within normal limits. The patient is awake, alert and oriented x 3. Insight, judgment intact.      LABORATORY PANEL:   CBC  Recent Labs Lab 03/13/16 0415  WBC 3.7*  HGB 11.7*  HCT 34.7*  PLT 171   ------------------------------------------------------------------------------------------------------------------  Chemistries   Recent Labs Lab 03/10/16 2346 03/11/16 0451  03/15/16 0525  NA 127*  --   < > 135  K 3.1*  --   < > 3.1*  CL 93*  --   < > 96*  CO2 24  --   < > 30  GLUCOSE 188*  --   < > 152*  BUN 10  --   < > <5*  CREATININE 0.50*  --   < > 0.98  CALCIUM 9.0  --   < > 8.4*  MG  --  4.4*  --   --   AST 67*  --   --   --   ALT 51  --   --   --   ALKPHOS  82  --   --   --   BILITOT <0.1*  --   --   --   < > = values in this interval not displayed. ------------------------------------------------------------------------------------------------------------------  Cardiac Enzymes  Recent Labs Lab 03/10/16 2346  [place]ONINI <0.03   ------------------------------------------------------------------------------------------------------------------  RADIOLOGY:  No results found.  EKG:   Orders placed or performed during the hospital encounter of 03/11/16  . ED EKG  . ED EKG  .  EKG 12-Lead  . EKG 12-Lead    ASSESSMENT AND PLAN:   Sergio [name] is a 40 y.o. male presenting with Abdominal Pain . Admitted 03/11/2016 : Day #: 4 days  1. Acute on chronic pancreatitis secondary to severe hypertriglyceridemia: Nothing by mouth, IV fluids, pain medication, IV insulin until triglycerides less than 1000. Endocrinology input appreciated 2. diabetes mellitus type 2 insulin requiring: Continue insulin drip for hypertriglyceridemia, D10 as required to maintain glucose no issues with hypoglycemia after increased D10 yesterday 3. hypokalemia replace the potassium. 4. Anxiety NOS: Psychiatry input appreciated continue zoloft 5. Early urinary retention: Continue current Flomax-improved  All the records are reviewed and case discussed with Care Management/Social Workerr. Management plans discussed with the patient, family and they are in agreement.  CODE STATUS: full TOTAL TIME TAKING CARE OF THIS PATIENT: 28 minutes.   POSSIBLE D/C IN 2-3DAYS, DEPENDING ON CLINICAL CONDITION.   Sergio [name],  Sergio [name].D on 03/15/2016 at 3:29 PM  Between 7am to 6pm - Pager - [phone]  After 6pm: House Pager: - [facility]  Office  [phone]  CC: Primary care physician; Crecencio Mc, MD

## 2016-03-16 LAB — CBC
HEMATOCRIT: 37.5 % — AB (ref 40.0–52.0)
HEMOGLOBIN: 12.7 g/dL — AB (ref 13.0–18.0)
MCH: 27.2 pg (ref 26.0–34.0)
MCHC: 33.8 g/dL (ref 32.0–36.0)
MCV: 80.4 fL (ref 80.0–100.0)
Platelets: 190 10*3/uL (ref 150–440)
RBC: 4.66 MIL/uL (ref 4.40–5.90)
RDW: 15.7 % — ABNORMAL HIGH (ref 11.5–14.5)
WBC: 2.6 10*3/uL — ABNORMAL LOW (ref 3.8–10.6)

## 2016-03-16 LAB — GLUCOSE, CAPILLARY
Glucose-Capillary: 116 mg/dL — ABNORMAL HIGH (ref 65–99)
Glucose-Capillary: 117 mg/dL — ABNORMAL HIGH (ref 65–99)
Glucose-Capillary: 119 mg/dL — ABNORMAL HIGH (ref 65–99)
Glucose-Capillary: 120 mg/dL — ABNORMAL HIGH (ref 65–99)
Glucose-Capillary: 120 mg/dL — ABNORMAL HIGH (ref 65–99)
Glucose-Capillary: 120 mg/dL — ABNORMAL HIGH (ref 65–99)
Glucose-Capillary: 121 mg/dL — ABNORMAL HIGH (ref 65–99)
Glucose-Capillary: 121 mg/dL — ABNORMAL HIGH (ref 65–99)
Glucose-Capillary: 124 mg/dL — ABNORMAL HIGH (ref 65–99)
Glucose-Capillary: 125 mg/dL — ABNORMAL HIGH (ref 65–99)
Glucose-Capillary: 126 mg/dL — ABNORMAL HIGH (ref 65–99)
Glucose-Capillary: 128 mg/dL — ABNORMAL HIGH (ref 65–99)
Glucose-Capillary: 128 mg/dL — ABNORMAL HIGH (ref 65–99)
Glucose-Capillary: 128 mg/dL — ABNORMAL HIGH (ref 65–99)
Glucose-Capillary: 128 mg/dL — ABNORMAL HIGH (ref 65–99)
Glucose-Capillary: 131 mg/dL — ABNORMAL HIGH (ref 65–99)
Glucose-Capillary: 135 mg/dL — ABNORMAL HIGH (ref 65–99)
Glucose-Capillary: 135 mg/dL — ABNORMAL HIGH (ref 65–99)
Glucose-Capillary: 139 mg/dL — ABNORMAL HIGH (ref 65–99)
Glucose-Capillary: 142 mg/dL — ABNORMAL HIGH (ref 65–99)
Glucose-Capillary: 146 mg/dL — ABNORMAL HIGH (ref 65–99)
Glucose-Capillary: 152 mg/dL — ABNORMAL HIGH (ref 65–99)

## 2016-03-16 LAB — BASIC METABOLIC PANEL
ANION GAP: 7 (ref 5–15)
BUN: <5 mg/dL — ABNORMAL LOW (ref 6–20)
CALCIUM: 8.9 mg/dL (ref 8.9–10.3)
CHLORIDE: 98 mmol/L — AB (ref 101–111)
CO2: 31 mmol/L (ref 22–32)
Creatinine, Ser: 0.96 mg/dL (ref 0.61–1.24)
GFR calc Af Amer: >60 mL/min (ref >60)
GFR calc non Af Amer: >60 mL/min (ref >60)
GLUCOSE: 133 mg/dL — AB (ref 65–99)
POTASSIUM: 3.5 mmol/L (ref 3.5–5.1)
Sodium: 136 mmol/L (ref 135–145)

## 2016-03-16 LAB — MAGNESIUM: Magnesium: 1.7 mg/dL (ref 1.7–2.4)

## 2016-03-16 LAB — TRIGLYCERIDES: Triglycerides: 1429 mg/dL — ABNORMAL HIGH (ref <150)

## 2016-03-16 MED ORDER — POTASSIUM CHLORIDE CRYS ER 20 MEQ PO TBCR
20.0000 meq | EXTENDED_RELEASE_TABLET | Freq: Once | ORAL | Status: AC
  Administered 2016-03-16: 20 meq via ORAL
  Filled 2016-03-16: qty 1

## 2016-03-16 NOTE — Progress Notes (Signed)
[facility] at [place] NAME: Sergio [name]    MRN#:  [id number]  DATE OF BIRTH:  [birth year]  SUBJECTIVE:  Hospital Day: 5 days Sergio [name] is a 40 y.o. male presenting with Abdominal Pain .   Overnight events: No overnight events Interval Events:No current issues  REVIEW OF SYSTEMS:  CONSTITUTIONAL: No fever, fatigue or weakness.  EYES: No blurred or double vision.  EARS, NOSE, AND THROAT: No tinnitus or ear pain.  RESPIRATORY: No cough, shortness of breath, wheezing or hemoptysis.  CARDIOVASCULAR: No chest pain, orthopnea, edema.  GASTROINTESTINAL: No nausea, vomiting, diarrhea , positive abdominal pain.  GENITOURINARY: No dysuria, hematuria.  ENDOCRINE: No polyuria, nocturia,  HEMATOLOGY: No anemia, easy bruising or bleeding SKIN: No rash or lesion. MUSCULOSKELETAL: No joint pain or arthritis.   NEUROLOGIC: No tingling, numbness, weakness.  PSYCHIATRY: Positive anxiety denies depression.   DRUG ALLERGIES:   Allergies  Allergen Reactions  . Codeine Anaphylaxis  . Ivp Dye [Iodinated Diagnostic Agents] Other (See Comments)    Kidneys stop working    VITALS:  Blood pressure 109/74, pulse 72, temperature 97.6 F (36.4 C), temperature source Oral, resp. rate 17, height 5\' 10"  (1.778 m), weight 247 lb 12.8 oz (112.4 kg), SpO2 96 %.  PHYSICAL EXAMINATION:  VITAL SIGNS: Filed Vitals:   03/16/16 1200 03/16/16 1300  BP: 99/62 109/74  Pulse: 65 72  Temp:    Resp: 6 17   GENERAL:40 y.o.male currently in no acute distress.  HEAD: Normocephalic, atraumatic.  EYES: Pupils equal, round, reactive to light. Extraocular muscles intact. No scleral icterus.  MOUTH: Moist mucosal membrane. Dentition intact. No abscess noted.  EAR, NOSE, THROAT: Clear without exudates. No external lesions.  NECK: Supple. No thyromegaly. No nodules. No JVD.  PULMONARY: Clear to ascultation, without wheeze rails or rhonci. No use of accessory muscles,  Good respiratory effort. good air entry bilaterally CHEST: Nontender to palpation.  CARDIOVASCULAR: S1 and S2. Regular rate and rhythm. No murmurs, rubs, or gallops. No edema. Pedal pulses 2+ bilaterally.  GASTROINTESTINAL: Soft, minimal tenderness on palpation without rebound or guarding, nondistended. No masses. Positive bowel sounds. No hepatosplenomegaly.  MUSCULOSKELETAL: No swelling, clubbing, or edema. Range of motion full in all extremities.  NEUROLOGIC: Cranial nerves II through XII are intact. No gross focal neurological deficits. Sensation intact. Reflexes intact.  SKIN: No ulceration, lesions, rashes, or cyanosis. Skin warm and dry. Turgor intact.  PSYCHIATRIC: Mood, affect within normal limits. The patient is awake, alert and oriented x 3. Insight, judgment intact.      LABORATORY PANEL:   CBC  Recent Labs Lab 03/16/16 0438  WBC 2.6*  HGB 12.7*  HCT 37.5*  PLT 190   ------------------------------------------------------------------------------------------------------------------  Chemistries   Recent Labs Lab 03/10/16 2346  03/16/16 0438  NA 127*  < > 136  K 3.1*  < > 3.5  CL 93*  < > 98*  CO2 24  < > 31  GLUCOSE 188*  < > 133*  BUN 10  < > <5*  CREATININE 0.50*  < > 0.96  CALCIUM 9.0  < > 8.9  MG  --   < > 1.7  AST 67*  --   --   ALT 51  --   --   ALKPHOS 82  --   --   BILITOT <0.1*  --   --   < > = values in this interval not displayed. ------------------------------------------------------------------------------------------------------------------  Cardiac Enzymes  Recent Labs Lab 03/10/16  [place] <0.03   ------------------------------------------------------------------------------------------------------------------  RADIOLOGY:  No results found.  EKG:   Orders placed or performed during the hospital encounter of 03/11/16  . ED EKG  . ED EKG  . EKG 12-Lead  . EKG 12-Lead    ASSESSMENT AND PLAN:   Sergio [name] is a 39  y.o. male presenting with Abdominal Pain . Admitted 03/11/2016 : Day #: 5 days  1. Acute on chronic pancreatitis secondary to severe hypertriglyceridemia: Nothing by mouth, IV fluids, pain medication, IV insulin until triglycerides less than 1000. Endocrinology input appreciated triglycerides continue to decrease stroke greater than threshold 2. diabetes mellitus type 2 insulin requiring: Continue insulin drip for hypertriglyceridemia, D10 as required to maintain glucose  3. hypokalemia replace the potassium. 4. Anxiety NOS: Psychiatry input appreciated continue zoloft 5. Early urinary retention: Continue current Flomax-improved  All the records are reviewed and case discussed with Care Management/Social Workerr. Management plans discussed with the patient, family and they are in agreement.  CODE STATUS: full TOTAL TIME TAKING CARE OF THIS PATIENT: 28 minutes.   POSSIBLE D/C IN 2-3DAYS, DEPENDING ON CLINICAL CONDITION.   Hower,  [name].D on 03/16/2016 at 1:31 PM  Between 7am to 6pm - Pager - [phone]  After 6pm: House Pager: - [phone]  [facility]  Office  [phone]  CC: Primary care physician; Crecencio Mc, MD

## 2016-03-16 NOTE — Progress Notes (Signed)
Endocrinology follow up  REQUESTING PROVIDER: Laverle Hobby, MD CONSULTING PROVIDER:  Lavone Orn, MD  History of present illness Sergio [name] is a 40 y.o. male seen in follow up for acute pancreatitis due to hypertriglyceridemia. He complains of persistent abd pain, some improved over last few days. At worse pain is 8/10 and after pain medications, pain will diminish to 3/10. Now with mild appetite. No N/V.   Currently on IV insulin 12 units/hr along with D10 200 cc/hr. Serum triglycerides remain high at 1429. Sugars have been in the 116-152 range. He remains NPO.  Medical history Past Medical History  Diagnosis Date  . Recurrent acute pancreatitis     secondary to hypertriglyceridemia   . Gastric ulcer 2009  . Sleep apnea 1999    uses CPAP  . Chicken pox   . Diabetes mellitus   . HTN (hypertension)   . Familial hypertriglyceridemia     severe  . Morbid obesity ([place])     s/p bariatric sleeve surgery 01/2015  . Anxiety   . Depression     Surgical history Past Surgical History  Procedure Laterality Date  . Tonsillectomy  2003  . Vasectomy    . Lumbar disc surgery  2008  . Cholecystectomy    . Laparoscopic gastric sleeve resection      Medications . enoxaparin (LOVENOX) injection  40 mg Subcutaneous Q24H  . finasteride  5 mg Oral Daily  . gemfibrozil  600 mg Oral BID AC  . omega-3 acid ethyl esters  2 g Oral BID  . pregabalin  300 mg Oral QHS  . rosuvastatin  10 mg Oral q1800  . sertraline  150 mg Oral Daily  . sodium chloride flush  3 mL Intravenous Q12H  . tamsulosin  0.8 mg Oral Daily  . traZODone  100 mg Oral QHS    Social history Social History  Substance Use Topics  . Smoking status: Never Smoker   . Smokeless tobacco: Never Used  . Alcohol Use: No     Comment: once month    Physical Exam BP 124/91 mmHg  Pulse 85  Temp(Src) 98.4 F (36.9 C) (Oral)  Resp 11  Ht 5\' 10"  (1.778 m)  Wt 112.4 kg (247 lb 12.8 oz)  BMI 35.56 kg/m2  SpO2  97%  GEN: Overweight white male, in NAD.  HEENT: No proptosis, EOMI, lid lag or stare. Oropharynx is clear.  NECK: supple, trachea midline.   RESPIRATORY: clear bilaterally, no wheeze, good inspiratory effort. CV: No carotid bruits, RRR. MUSCULOSKELETAL: normal gait and station, no clubbing, no tremor ABD: soft, +epigastric TTP EXT: no peripheral edema SKIN: no dermatopathy or rash or acanthosis nigricans. No xanthomas or xanthomatas. LYMPH: no submandibular or supraclavicular LAD NEURO: PERRL. PSYC: alert and oriented, good insight  Labs Results for orders placed or performed during the hospital encounter of 03/11/16 (from the past 24 hour(s))  Glucose, capillary     Status: Abnormal   Collection Time: 03/15/16  6:04 PM  Result Value Ref Range   Glucose-Capillary 113 (H) 65 - 99 mg/dL  Potassium     Status: Abnormal   Collection Time: 03/15/16  6:35 PM  Result Value Ref Range   Potassium 3.2 (L) 3.5 - 5.1 mmol/L  Glucose, capillary     Status: Abnormal   Collection Time: 03/15/16  7:07 PM  Result Value Ref Range   Glucose-Capillary 131 (H) 65 - 99 mg/dL  Glucose, capillary     Status: Abnormal   Collection Time:  03/15/16  7:56 PM  Result Value Ref Range   Glucose-Capillary 108 (H) 65 - 99 mg/dL  Glucose, capillary     Status: Abnormal   Collection Time: 03/15/16  9:05 PM  Result Value Ref Range   Glucose-Capillary 134 (H) 65 - 99 mg/dL  Glucose, capillary     Status: Abnormal   Collection Time: 03/15/16 11:07 PM  Result Value Ref Range   Glucose-Capillary 139 (H) 65 - 99 mg/dL  Glucose, capillary     Status: Abnormal   Collection Time: 03/16/16 12:18 AM  Result Value Ref Range   Glucose-Capillary 126 (H) 65 - 99 mg/dL  Glucose, capillary     Status: Abnormal   Collection Time: 03/16/16  2:11 AM  Result Value Ref Range   Glucose-Capillary 128 (H) 65 - 99 mg/dL  Glucose, capillary     Status: Abnormal   Collection Time: 03/16/16  4:19 AM  Result Value Ref Range    Glucose-Capillary 135 (H) 65 - 99 mg/dL  Triglycerides     Status: Abnormal   Collection Time: 03/16/16  4:38 AM  Result Value Ref Range   Triglycerides 1429 (H) <150 mg/dL  Basic metabolic panel     Status: Abnormal   Collection Time: 03/16/16  4:38 AM  Result Value Ref Range   Sodium 136 135 - 145 mmol/L   Potassium 3.5 3.5 - 5.1 mmol/L   Chloride 98 (L) 101 - 111 mmol/L   CO2 31 22 - 32 mmol/L   Glucose, Bld 133 (H) 65 - 99 mg/dL   BUN <5 (L) 6 - 20 mg/dL   Creatinine, Ser 0.96 0.61 - 1.24 mg/dL   Calcium 8.9 8.9 - 10.3 mg/dL   GFR calc non Af Amer >60 >60 mL/min   GFR calc Af Amer >60 >60 mL/min   Anion gap 7 5 - 15  Magnesium     Status: None   Collection Time: 03/16/16  4:38 AM  Result Value Ref Range   Magnesium 1.7 1.7 - 2.4 mg/dL  CBC     Status: Abnormal   Collection Time: 03/16/16  4:38 AM  Result Value Ref Range   WBC 2.6 (L) 3.8 - 10.6 K/uL   RBC 4.66 4.40 - 5.90 MIL/uL   Hemoglobin 12.7 (L) 13.0 - 18.0 g/dL   HCT 37.5 (L) 40.0 - 52.0 %   MCV 80.4 80.0 - 100.0 fL   MCH 27.2 26.0 - 34.0 pg   MCHC 33.8 32.0 - 36.0 g/dL   RDW 15.7 (H) 11.5 - 14.5 %   Platelets 190 150 - 440 K/uL  Glucose, capillary     Status: Abnormal   Collection Time: 03/16/16  6:03 AM  Result Value Ref Range   Glucose-Capillary 131 (H) 65 - 99 mg/dL  Glucose, capillary     Status: Abnormal   Collection Time: 03/16/16  6:59 AM  Result Value Ref Range   Glucose-Capillary 142 (H) 65 - 99 mg/dL  Glucose, capillary     Status: Abnormal   Collection Time: 03/16/16  8:05 AM  Result Value Ref Range   Glucose-Capillary 152 (H) 65 - 99 mg/dL  Glucose, capillary     Status: Abnormal   Collection Time: 03/16/16  9:06 AM  Result Value Ref Range   Glucose-Capillary 146 (H) 65 - 99 mg/dL  Glucose, capillary     Status: Abnormal   Collection Time: 03/16/16 10:06 AM  Result Value Ref Range   Glucose-Capillary 120 (H) 65 - 99 mg/dL  Glucose, capillary     Status: Abnormal   Collection Time:  03/16/16 11:13 AM  Result Value Ref Range   Glucose-Capillary 135 (H) 65 - 99 mg/dL  Glucose, capillary     Status: Abnormal   Collection Time: 03/16/16 12:33 PM  Result Value Ref Range   Glucose-Capillary 128 (H) 65 - 99 mg/dL  Glucose, capillary     Status: Abnormal   Collection Time: 03/16/16  1:11 PM  Result Value Ref Range   Glucose-Capillary 128 (H) 65 - 99 mg/dL  Glucose, capillary     Status: Abnormal   Collection Time: 03/16/16  2:15 PM  Result Value Ref Range   Glucose-Capillary 116 (H) 65 - 99 mg/dL  Glucose, capillary     Status: Abnormal   Collection Time: 03/16/16  3:08 PM  Result Value Ref Range   Glucose-Capillary 125 (H) 65 - 99 mg/dL  Glucose, capillary     Status: Abnormal   Collection Time: 03/16/16  4:09 PM  Result Value Ref Range   Glucose-Capillary 119 (H) 65 - 99 mg/dL  Glucose, capillary     Status: Abnormal   Collection Time: 03/16/16  5:08 PM  Result Value Ref Range   Glucose-Capillary 120 (H) 65 - 99 mg/dL    Assessment 1. Acute pancreatitis due to hypertriglyceridemia 2. Familial hypertriglyceridemia 3. Type 2 diabetes mellitus  Plan Triglycerides improving. Continue rate of IV insulin at 12 units/hr. Continue D10 at a rate to keep blood sugar in the 100 - 180 range. No exam findings to suggest fluid overload. Continue to monitor finger stick blood sugars q1 hr.   Plan to keep him on IV insulin until Triglycerides are <1000. Monitor AM triglycerides daily. Jyaire and I are optimistic that triglycerides will improve sufficiently tomorrow to transition off IV insulin. He was asked to have his insulin pump available and all supplies ready at bedside, as plan will be to go from IV insulin to insulin pump.   Continue gemfibrozil 600 mg bid, fish oil 4g daily and Crestor 10 mg daily.   Will start diet of broth and sugar free jello and sugar free popsicles only.   Will follow along with you.

## 2016-03-16 NOTE — Progress Notes (Signed)
Pt triglycerides continue to trend down, not where MD wants it to be yet. Continue on D10 and insulin gtt at this time. Continues to complain of abdominal pain and requesting Dilaudid every three hour

## 2016-03-16 NOTE — Progress Notes (Signed)
Pharmacy Consult for Potassium Replacement Indication: Insulin drip for TG  Allergies  Allergen Reactions  . Codeine Anaphylaxis  . Ivp Dye [Iodinated Diagnostic Agents] Other (See Comments)    Kidneys stop working    Patient Measurements: Height: 5\' 10"  (177.8 cm) Weight: 247 lb 12.8 oz (112.4 kg) IBW/kg (Calculated) : 73  Vital Signs: Temp: 97.2 F (36.2 C) (06/06 0400) Temp Source: Axillary (06/06 0400) BP: 97/75 mmHg (06/06 0600) Pulse Rate: 60 (06/06 0600) Intake/Output from previous day: 06/05 0701 - 06/06 0700 In: 4902.9 [I.V.:4902.9] Out: 1901 [Urine:1900; Stool:1] Intake/Output from this shift: Total I/O In: 2358.9 [I.V.:2358.9] Out: -   Labs:  Recent Labs  03/16/16 0438  WBC 2.6*  HGB 12.7*  HCT 37.5*  PLT 190     Recent Labs  03/14/16 0543 03/15/16 0525 03/15/16 1835 03/16/16 0438  NA 135 135  --  136  K 3.6 3.1* 3.2* 3.5  CL 96* 96*  --  98*  CO2 25 30  --  31  GLUCOSE 93 152*  --  133*  BUN <5* <5*  --  <5*  CREATININE 0.75 0.98  --  0.96  CALCIUM 9.1 8.4*  --  8.9  MG  --   --   --  1.7  TRIG 3305* 1711*  --  1429*   Estimated Creatinine Clearance: 131 mL/min (by C-G formula based on Cr of 0.96).    Recent Labs  03/16/16 0018 03/16/16 0419 03/16/16 0603  GLUCAP 126* 135* 131*     Medications:  Scheduled:  . enoxaparin (LOVENOX) injection  40 mg Subcutaneous Q24H  . finasteride  5 mg Oral Daily  . gemfibrozil  600 mg Oral BID AC  . omega-3 acid ethyl esters  2 g Oral BID  . pregabalin  300 mg Oral QHS  . rosuvastatin  10 mg Oral q1800  . sertraline  150 mg Oral Daily  . sodium chloride flush  3 mL Intravenous Q12H  . tamsulosin  0.8 mg Oral Daily  . traZODone  100 mg Oral QHS   Infusions:  . dextrose 200 mL/hr at 03/16/16 0611  . insulin (NOVOLIN-R) infusion 12 mL/hr at 03/16/16 [id number]      Assessment: Pharmacy consulted to assist in managing potassium in this 40 y/o M with pancreatitis secondary to  hypertriglyceridemia on insulin drip.   Potassium: 3.2  Plan: K 3.5, Mg 1.7. Will give KCl 20 mEq po x 1 and recheck electrolytes with AM labs.   Laural Benes, Pharm.D., BCPS Clinical Pharmacist 03/16/2016,6:44 AM

## 2016-03-17 LAB — BASIC METABOLIC PANEL
ANION GAP: 7 (ref 5–15)
Anion gap: 6 (ref 5–15)
CALCIUM: 8.7 mg/dL — AB (ref 8.9–10.3)
CHLORIDE: 100 mmol/L — AB (ref 101–111)
CO2: 30 mmol/L (ref 22–32)
CO2: 29 mmol/L (ref 22–32)
CREATININE: 0.95 mg/dL (ref 0.61–1.24)
CREATININE: 0.88 mg/dL (ref 0.61–1.24)
Calcium: 8.8 mg/dL — ABNORMAL LOW (ref 8.9–10.3)
Chloride: 96 mmol/L — ABNORMAL LOW (ref 101–111)
GFR calc Af Amer: >60 mL/min (ref >60)
GFR calc Af Amer: >60 mL/min (ref >60)
GFR calc non Af Amer: >60 mL/min (ref >60)
GLUCOSE: 120 mg/dL — AB (ref 65–99)
GLUCOSE: 122 mg/dL — AB (ref 65–99)
POTASSIUM: 3.5 mmol/L (ref 3.5–5.1)
Potassium: 3.1 mmol/L — ABNORMAL LOW (ref 3.5–5.1)
SODIUM: 135 mmol/L (ref 135–145)
Sodium: 133 mmol/L — ABNORMAL LOW (ref 135–145)

## 2016-03-17 LAB — GLUCOSE, CAPILLARY
Glucose-Capillary: 104 mg/dL — ABNORMAL HIGH (ref 65–99)
Glucose-Capillary: 105 mg/dL — ABNORMAL HIGH (ref 65–99)
Glucose-Capillary: 115 mg/dL — ABNORMAL HIGH (ref 65–99)
Glucose-Capillary: 123 mg/dL — ABNORMAL HIGH (ref 65–99)
Glucose-Capillary: 128 mg/dL — ABNORMAL HIGH (ref 65–99)
Glucose-Capillary: 128 mg/dL — ABNORMAL HIGH (ref 65–99)
Glucose-Capillary: 129 mg/dL — ABNORMAL HIGH (ref 65–99)
Glucose-Capillary: 131 mg/dL — ABNORMAL HIGH (ref 65–99)
Glucose-Capillary: 134 mg/dL — ABNORMAL HIGH (ref 65–99)
Glucose-Capillary: 134 mg/dL — ABNORMAL HIGH (ref 65–99)
Glucose-Capillary: 135 mg/dL — ABNORMAL HIGH (ref 65–99)
Glucose-Capillary: 138 mg/dL — ABNORMAL HIGH (ref 65–99)
Glucose-Capillary: 138 mg/dL — ABNORMAL HIGH (ref 65–99)
Glucose-Capillary: 141 mg/dL — ABNORMAL HIGH (ref 65–99)
Glucose-Capillary: 152 mg/dL — ABNORMAL HIGH (ref 65–99)
Glucose-Capillary: 90 mg/dL (ref 65–99)
Glucose-Capillary: 91 mg/dL (ref 65–99)

## 2016-03-17 LAB — TRIGLYCERIDES: TRIGLYCERIDES: 1111 mg/dL — AB (ref <150)

## 2016-03-17 LAB — MAGNESIUM: Magnesium: 1.6 mg/dL — ABNORMAL LOW (ref 1.7–2.4)

## 2016-03-17 LAB — PHOSPHORUS: Phosphorus: 4.5 mg/dL (ref 2.5–4.6)

## 2016-03-17 MED ORDER — MAGNESIUM SULFATE 4 GM/100ML IV SOLN
4.0000 g | Freq: Once | INTRAVENOUS | Status: AC
  Administered 2016-03-17: 4 g via INTRAVENOUS
  Filled 2016-03-17: qty 100

## 2016-03-17 MED ORDER — POTASSIUM CHLORIDE CRYS ER 20 MEQ PO TBCR
40.0000 meq | EXTENDED_RELEASE_TABLET | Freq: Once | ORAL | Status: AC
  Administered 2016-03-17: 40 meq via ORAL
  Filled 2016-03-17: qty 2

## 2016-03-17 MED ORDER — INSULIN PUMP
Freq: Three times a day (TID) | SUBCUTANEOUS | Status: DC
  Administered 2016-03-17 – 2016-03-19 (×4): via SUBCUTANEOUS
  Filled 2016-03-17: qty 1

## 2016-03-17 MED ORDER — POTASSIUM CHLORIDE 10 MEQ/100ML IV SOLN
10.0000 meq | INTRAVENOUS | Status: DC
  Filled 2016-03-17 (×4): qty 100

## 2016-03-17 NOTE — Progress Notes (Signed)
Sergio [name] at [facility] NAME: Sergio [name]    MRN#:  [id number]  DATE OF BIRTH:  12-Oct-1975  SUBJECTIVE:  Hospital Day: 6 days Sergio [name] is a 40 y.o. male presenting with Abdominal Pain .   Overnight events: No overnight events Interval Events:No current issues  REVIEW OF SYSTEMS:  CONSTITUTIONAL: No fever, fatigue or weakness.  EYES: No blurred or double vision.  EARS, NOSE, AND THROAT: No tinnitus or ear pain.  RESPIRATORY: No cough, shortness of breath, wheezing or hemoptysis.  CARDIOVASCULAR: No chest pain, orthopnea, edema.  GASTROINTESTINAL: No nausea, vomiting, diarrhea , positive abdominal pain.  GENITOURINARY: No dysuria, hematuria.  ENDOCRINE: No polyuria, nocturia,  HEMATOLOGY: No anemia, easy bruising or bleeding SKIN: No rash or lesion. MUSCULOSKELETAL: No joint pain or arthritis.   NEUROLOGIC: No tingling, numbness, weakness.  PSYCHIATRY: Positive anxiety denies depression.   DRUG ALLERGIES:   Allergies  Allergen Reactions  . Codeine Anaphylaxis  . Ivp Dye [Iodinated Diagnostic Agents] Other (See Comments)    Kidneys stop working    VITALS:  Blood pressure 105/69, pulse 74, temperature 97.6 F (36.4 C), temperature source Oral, resp. rate 12, height 5\' 10"  (1.778 m), weight 247 lb 12.8 oz (112.4 kg), SpO2 88 %.  PHYSICAL EXAMINATION:  VITAL SIGNS: Filed Vitals:   03/17/16 1100 03/17/16 1200  BP: 125/87 105/69  Pulse: 66 74  Temp:    Resp: 12 12   GENERAL:40 y.o.male currently in no acute distress.  HEAD: Normocephalic, atraumatic.  EYES: Pupils equal, round, reactive to light. Extraocular muscles intact. No scleral icterus.  MOUTH: Moist mucosal membrane. Dentition intact. No abscess noted.  EAR, NOSE, THROAT: Clear without exudates. No external lesions.  NECK: Supple. No thyromegaly. No nodules. No JVD.  PULMONARY: Clear to ascultation, without wheeze rails or rhonci. No use of accessory  muscles, Good respiratory effort. good air entry bilaterally CHEST: Nontender to palpation.  CARDIOVASCULAR: S1 and S2. Regular rate and rhythm. No murmurs, rubs, or gallops. No edema. Pedal pulses 2+ bilaterally.  GASTROINTESTINAL: Soft, minimal tenderness on palpation without rebound or guarding, nondistended. No masses. Positive bowel sounds. No hepatosplenomegaly.  MUSCULOSKELETAL: No swelling, clubbing, or edema. Range of motion full in all extremities.  NEUROLOGIC: Cranial nerves II through XII are intact. No gross focal neurological deficits. Sensation intact. Reflexes intact.  SKIN: No ulceration, lesions, rashes, or cyanosis. Skin warm and dry. Turgor intact.  PSYCHIATRIC: Mood, affect within normal limits. The patient is awake, alert and oriented x 3. Insight, judgment intact.      LABORATORY PANEL:   CBC  Recent Labs Lab 03/16/16 0438  WBC 2.6*  HGB 12.7*  HCT 37.5*  PLT 190   ------------------------------------------------------------------------------------------------------------------  Chemistries   Recent Labs Lab 03/10/16 2346  03/17/16 0440  NA 127*  < > 133*  K 3.1*  < > 3.1*  CL 93*  < > 96*  CO2 24  < > 30  GLUCOSE 188*  < > 120*  BUN 10  < > <5*  CREATININE 0.50*  < > 0.95  CALCIUM 9.0  < > 8.7*  MG  --   < > 1.6*  AST 67*  --   --   ALT 51  --   --   ALKPHOS 82  --   --   BILITOT <0.1*  --   --   < > = values in this interval not displayed. ------------------------------------------------------------------------------------------------------------------  Cardiac Enzymes  Recent Labs Lab 03/10/16  [place] <0.03   ------------------------------------------------------------------------------------------------------------------  RADIOLOGY:  No results found.  EKG:   Orders placed or performed during the hospital encounter of 03/11/16  . ED EKG  . ED EKG  . EKG 12-Lead  . EKG 12-Lead    ASSESSMENT AND PLAN:   Sergio [name] is a 40 y.o. male presenting with Abdominal Pain . Admitted 03/11/2016 : Day #: 6 days  1. Acute on chronic pancreatitis secondary to severe hypertriglyceridemia: Nothing by mouth, IV fluids, pain medication, IV insulin until triglycerides less than 1000 (1111 today). Endocrinology input appreciated triglycerides continue to decrease stroke greater than threshold 2. diabetes mellitus type 2 insulin requiring: Continue insulin drip for hypertriglyceridemia, D10 as required to maintain glucose  3. hypokalemia replace the potassium. 4. Anxiety NOS: Psychiatry input appreciated continue zoloft 5. Early urinary retention: Continue current Flomax-improved  All the records are reviewed and case discussed with Care Management/Social Workerr. Management plans discussed with the patient, family and they are in agreement.  CODE STATUS: full TOTAL TIME TAKING CARE OF THIS PATIENT: 28 minutes.   POSSIBLE D/C IN 2-3DAYS, DEPENDING ON CLINICAL CONDITION.   Hower,  Sergio [name].D on 03/17/2016 at 2:24 PM  Between 7am to 6pm - Pager - [phone]  After 6pm: House Pager: - [facility]  Office  [phone]  CC: Primary care physician; Sergio Mc, MD

## 2016-03-17 NOTE — Progress Notes (Signed)
Pharmacy Consult for Potassium Replacement Indication: Insulin drip for TG  Allergies  Allergen Reactions  . Codeine Anaphylaxis  . Ivp Dye [Iodinated Diagnostic Agents] Other (See Comments)    Kidneys stop working    Patient Measurements: Height: 5\' 10"  (177.8 cm) Weight: 247 lb 12.8 oz (112.4 kg) IBW/kg (Calculated) : 73  Vital Signs: Temp: 98.5 F (36.9 C) (06/06 1930) Temp Source: Oral (06/06 1930) BP: 102/72 mmHg (06/07 0400) Pulse Rate: 64 (06/07 0400) Intake/Output from previous day: 06/06 0701 - 06/07 0700 In: 2332 [I.V.:2332] Out: 2400 [Urine:2400] Intake/Output from this shift:    Labs:  Recent Labs  03/16/16 0438  WBC 2.6*  HGB 12.7*  HCT 37.5*  PLT 190     Recent Labs  03/15/16 0525 03/15/16 1835 03/16/16 0438 03/17/16 0440  NA 135  --  136 133*  K 3.1* 3.2* 3.5 3.1*  CL 96*  --  98* 96*  CO2 30  --  31 30  GLUCOSE 152*  --  133* 120*  BUN <5*  --  <5* <5*  CREATININE 0.98  --  0.96 0.95  CALCIUM 8.4*  --  8.9 8.7*  MG  --   --  1.7 1.6*  PHOS  --   --   --  4.5  TRIG 1711*  --  1429* 1111*   Estimated Creatinine Clearance: 132.4 mL/min (by C-G formula based on Cr of 0.95).    Recent Labs  03/17/16 0259 03/17/16 0404 03/17/16 0551  GLUCAP 138* 135* 128*     Medications:  Scheduled:  . enoxaparin (LOVENOX) injection  40 mg Subcutaneous Q24H  . finasteride  5 mg Oral Daily  . gemfibrozil  600 mg Oral BID AC  . magnesium sulfate 1 - 4 g bolus IVPB  4 g Intravenous Once  . omega-3 acid ethyl esters  2 g Oral BID  . potassium chloride  10 mEq Intravenous Q1 Hr x 4  . pregabalin  300 mg Oral QHS  . rosuvastatin  10 mg Oral q1800  . sertraline  150 mg Oral Daily  . sodium chloride flush  3 mL Intravenous Q12H  . tamsulosin  0.8 mg Oral Daily  . traZODone  100 mg Oral QHS   Infusions:  . dextrose 200 mL/hr at 03/16/16 1838  . insulin (NOVOLIN-R) infusion 12 Units/hr (03/16/16 0913)      Assessment: Pharmacy consulted to  assist in managing potassium in this 40 y/o M with pancreatitis secondary to hypertriglyceridemia on insulin drip.   Potassium: 3.2  Plan: K 3.1, Mg 1.6, phos 4.5 Ordered magnesium sulfate 4 gm IV x 1 and potassium chloride 10 mEq IV Q1H x 4 doses. Will recheck BMP this afternoon, and all electrolytes with AM labs.   Laural Benes, Pharm.D., BCPS Clinical Pharmacist 03/17/2016,6:15 AM

## 2016-03-17 NOTE — Progress Notes (Signed)
Dr.Solum returned page at 1530 instructed to not run insulin pump and IV insulin at this same time. Instructed to turn off insulin drip and D10 when pt starts insulin pump.   Will continue to assess.

## 2016-03-17 NOTE — Progress Notes (Signed)
Spoke with Dr. Gabriel Carina. New orders given to start the insulin pump order sets.  Diabetes coordinator paged to clarify method of transition.   Pt insulin pump is at bedside. Pt is alert and oriented and able to follow instructions.

## 2016-03-17 NOTE — Care Management (Signed)
Barrier to discharge- insulin drip until triglycerides are < 1000.  Currently 1111.

## 2016-03-17 NOTE — Progress Notes (Signed)
Dr. Claria Dice gave order to d/c telemetry monitor.

## 2016-03-17 NOTE — Progress Notes (Signed)
Spoke briefly with patient regarding insulin pump supplies.  He states that he has everything except a new set for insertion.  He is calling someone to bring this so that once insulin pump is to be restarted, he will be ready.  Discussed with RN.  Thanks, Adah Perl, RN, BC-ADM Inpatient Diabetes Coordinator Pager [phone] (8a-5p)

## 2016-03-17 NOTE — Progress Notes (Signed)
Transferred to 1-A, Rm 133.  Report called.  Pt left via wheelchair with all personal belongings.

## 2016-03-17 NOTE — Progress Notes (Signed)
Diabetes Coordinator recommending to start insulin pump and wait 2 hours to stop D10 and IV insulin paging Dr.Solum to confirm.

## 2016-03-17 NOTE — Progress Notes (Signed)
Pt is alert and oriented this morning and interacting appropriately with staff. Pt pain was controlled with dilaudid 2 mg. Pt is resting now with CPAP on. Pt requesting breakfast. Will continue to assess.

## 2016-03-17 NOTE — Progress Notes (Signed)
Endocrinology follow up  REQUESTING PROVIDER: Laverle Hobby, MD CONSULTING PROVIDER:  Lavone Orn, MD  History of present illness Sergio [name] is a 39 y.o. male seen in follow up for acute pancreatitis due to hypertriglyceridemia. Abd pain is improved. Tolerating broth and jello diet. No N/V. Currently on IV insulin 12 units/hr along with D10. Serum triglycerides are improved at 1111 mg/dl. Sugars have been in the 123-138 range. He does not have insertion set for the insulin pump at the bedside. He believes wife will bring it in later this afternoon.  Medical history Past Medical History  Diagnosis Date  . Recurrent acute pancreatitis     secondary to hypertriglyceridemia   . Gastric ulcer 2009  . Sleep apnea 1999    uses CPAP  . Chicken pox   . Diabetes mellitus   . HTN (hypertension)   . Familial hypertriglyceridemia     severe  . Morbid obesity ([place])     s/p bariatric sleeve surgery 01/2015  . Anxiety   . Depression     Surgical history Past Surgical History  Procedure Laterality Date  . Tonsillectomy  2003  . Vasectomy    . Lumbar disc surgery  2008  . Cholecystectomy    . Laparoscopic gastric sleeve resection      Medications . enoxaparin (LOVENOX) injection  40 mg Subcutaneous Q24H  . finasteride  5 mg Oral Daily  . gemfibrozil  600 mg Oral BID AC  . insulin pump   Subcutaneous TID AC, HS, 0200  . omega-3 acid ethyl esters  2 g Oral BID  . pregabalin  300 mg Oral QHS  . rosuvastatin  10 mg Oral q1800  . sertraline  150 mg Oral Daily  . sodium chloride flush  3 mL Intravenous Q12H  . tamsulosin  0.8 mg Oral Daily  . traZODone  100 mg Oral QHS    Social history Social History  Substance Use Topics  . Smoking status: Never Smoker   . Smokeless tobacco: Never Used  . Alcohol Use: No     Comment: once month    Physical Exam BP 126/83 mmHg  Pulse 69  Temp(Src) 99.4 F (37.4 C) (Oral)  Resp 8  Ht 5\' 10"  (1.778 m)  Wt 112.4 kg (247 lb 12.8  oz)  BMI 35.56 kg/m2  SpO2 90%  GEN: Overweight white male, in NAD.  HEENT: No proptosis, EOMI, lid lag or stare. Oropharynx is clear.  NECK: supple, trachea midline.   RESPIRATORY: clear bilaterally, no wheeze, good inspiratory effort. CV: No carotid bruits, RRR. MUSCULOSKELETAL: normal gait and station, no clubbing, no tremor ABD: soft, +epigastric TTP EXT: no peripheral edema SKIN: no dermatopathy or rash or acanthosis nigricans. No xanthomas or xanthomatas. LYMPH: no submandibular or supraclavicular LAD NEURO: PERRL. PSYC: alert and oriented, good insight  Labs Results for orders placed or performed during the hospital encounter of 03/11/16 (from the past 24 hour(s))  Glucose, capillary     Status: Abnormal   Collection Time: 03/16/16  6:09 PM  Result Value Ref Range   Glucose-Capillary 120 (H) 65 - 99 mg/dL  Glucose, capillary     Status: Abnormal   Collection Time: 03/16/16  7:14 PM  Result Value Ref Range   Glucose-Capillary 128 (H) 65 - 99 mg/dL  Glucose, capillary     Status: Abnormal   Collection Time: 03/16/16  8:00 PM  Result Value Ref Range   Glucose-Capillary 117 (H) 65 - 99 mg/dL  Glucose, capillary  Status: Abnormal   Collection Time: 03/16/16  9:28 PM  Result Value Ref Range   Glucose-Capillary 121 (H) 65 - 99 mg/dL  Glucose, capillary     Status: Abnormal   Collection Time: 03/16/16 10:42 PM  Result Value Ref Range   Glucose-Capillary 124 (H) 65 - 99 mg/dL  Glucose, capillary     Status: Abnormal   Collection Time: 03/16/16 11:40 PM  Result Value Ref Range   Glucose-Capillary 121 (H) 65 - 99 mg/dL  Glucose, capillary     Status: Abnormal   Collection Time: 03/17/16 12:32 AM  Result Value Ref Range   Glucose-Capillary 134 (H) 65 - 99 mg/dL  Glucose, capillary     Status: Abnormal   Collection Time: 03/17/16  2:09 AM  Result Value Ref Range   Glucose-Capillary 131 (H) 65 - 99 mg/dL  Glucose, capillary     Status: Abnormal   Collection Time:  03/17/16  2:59 AM  Result Value Ref Range   Glucose-Capillary 138 (H) 65 - 99 mg/dL  Glucose, capillary     Status: Abnormal   Collection Time: 03/17/16  4:04 AM  Result Value Ref Range   Glucose-Capillary 135 (H) 65 - 99 mg/dL  Triglycerides     Status: Abnormal   Collection Time: 03/17/16  4:40 AM  Result Value Ref Range   Triglycerides 1111 (H) <150 mg/dL  Basic metabolic panel     Status: Abnormal   Collection Time: 03/17/16  4:40 AM  Result Value Ref Range   Sodium 133 (L) 135 - 145 mmol/L   Potassium 3.1 (L) 3.5 - 5.1 mmol/L   Chloride 96 (L) 101 - 111 mmol/L   CO2 30 22 - 32 mmol/L   Glucose, Bld 120 (H) 65 - 99 mg/dL   BUN <5 (L) 6 - 20 mg/dL   Creatinine, Ser 0.95 0.61 - 1.24 mg/dL   Calcium 8.7 (L) 8.9 - 10.3 mg/dL   GFR calc non Af Amer >60 >60 mL/min   GFR calc Af Amer >60 >60 mL/min   Anion gap 7 5 - 15  Magnesium     Status: Abnormal   Collection Time: 03/17/16  4:40 AM  Result Value Ref Range   Magnesium 1.6 (L) 1.7 - 2.4 mg/dL  Phosphorus     Status: None   Collection Time: 03/17/16  4:40 AM  Result Value Ref Range   Phosphorus 4.5 2.5 - 4.6 mg/dL  Glucose, capillary     Status: Abnormal   Collection Time: 03/17/16  5:51 AM  Result Value Ref Range   Glucose-Capillary 128 (H) 65 - 99 mg/dL  Glucose, capillary     Status: Abnormal   Collection Time: 03/17/16  7:14 AM  Result Value Ref Range   Glucose-Capillary 138 (H) 65 - 99 mg/dL  Glucose, capillary     Status: Abnormal   Collection Time: 03/17/16  8:00 AM  Result Value Ref Range   Glucose-Capillary 152 (H) 65 - 99 mg/dL  Glucose, capillary     Status: Abnormal   Collection Time: 03/17/16  9:06 AM  Result Value Ref Range   Glucose-Capillary 123 (H) 65 - 99 mg/dL  Glucose, capillary     Status: Abnormal   Collection Time: 03/17/16 10:06 AM  Result Value Ref Range   Glucose-Capillary 134 (H) 65 - 99 mg/dL  Glucose, capillary     Status: Abnormal   Collection Time: 03/17/16 11:07 AM  Result Value  Ref Range   Glucose-Capillary 128 (H) 65 -  99 mg/dL  Glucose, capillary     Status: Abnormal   Collection Time: 03/17/16 12:15 PM  Result Value Ref Range   Glucose-Capillary 141 (H) 65 - 99 mg/dL  Glucose, capillary     Status: Abnormal   Collection Time: 03/17/16  1:11 PM  Result Value Ref Range   Glucose-Capillary 129 (H) 65 - 99 mg/dL  Glucose, capillary     Status: Abnormal   Collection Time: 03/17/16  2:04 PM  Result Value Ref Range   Glucose-Capillary 105 (H) 65 - 99 mg/dL  Basic metabolic panel     Status: Abnormal   Collection Time: 03/17/16  2:05 PM  Result Value Ref Range   Sodium 135 135 - 145 mmol/L   Potassium 3.5 3.5 - 5.1 mmol/L   Chloride 100 (L) 101 - 111 mmol/L   CO2 29 22 - 32 mmol/L   Glucose, Bld 122 (H) 65 - 99 mg/dL   BUN <5 (L) 6 - 20 mg/dL   Creatinine, Ser 0.88 0.61 - 1.24 mg/dL   Calcium 8.8 (L) 8.9 - 10.3 mg/dL   GFR calc non Af Amer >60 >60 mL/min   GFR calc Af Amer >60 >60 mL/min   Anion gap 6 5 - 15  Glucose, capillary     Status: Abnormal   Collection Time: 03/17/16  3:03 PM  Result Value Ref Range   Glucose-Capillary 104 (H) 65 - 99 mg/dL  Glucose, capillary     Status: None   Collection Time: 03/17/16  4:22 PM  Result Value Ref Range   Glucose-Capillary 91 65 - 99 mg/dL    Assessment 1. Acute pancreatitis due to hypertriglyceridemia 2. Familial hypertriglyceridemia 3. Type 2 diabetes mellitus  Plan Triglycerides improving. Continue IV insulin and IV dextrose until he has all necessary pump supplies at bedside. Anticipate this will occur this afternoon and then we can transition from IV insulin to insulin pump.   Once IV insulin is stopped, frequency of fingerstick blood sugars can be reduced to qACHS and daily at 2 AM.  Continue gemfibrozil 600 mg bid, fish oil 4g daily and Crestor 10 mg daily.   Continue diet of broth and sugar free jello and sugar free popsicles only. May consider advancing diet to additional liquids tomorrow, if  tolerated.  Will follow along with you.

## 2016-03-18 ENCOUNTER — Encounter: Payer: Self-pay | Admitting: Internal Medicine

## 2016-03-18 LAB — BASIC METABOLIC PANEL
ANION GAP: 8 (ref 5–15)
BUN: 6 mg/dL (ref 6–20)
CHLORIDE: 101 mmol/L (ref 101–111)
CO2: 28 mmol/L (ref 22–32)
Calcium: 8.6 mg/dL — ABNORMAL LOW (ref 8.9–10.3)
Creatinine, Ser: 0.99 mg/dL (ref 0.61–1.24)
GFR calc Af Amer: >60 mL/min (ref >60)
GLUCOSE: 125 mg/dL — AB (ref 65–99)
POTASSIUM: 3.5 mmol/L (ref 3.5–5.1)
Sodium: 137 mmol/L (ref 135–145)

## 2016-03-18 LAB — GLUCOSE, CAPILLARY
Glucose-Capillary: 107 mg/dL — ABNORMAL HIGH (ref 65–99)
Glucose-Capillary: 117 mg/dL — ABNORMAL HIGH (ref 65–99)
Glucose-Capillary: 120 mg/dL — ABNORMAL HIGH (ref 65–99)
Glucose-Capillary: 70 mg/dL (ref 65–99)
Glucose-Capillary: 78 mg/dL (ref 65–99)

## 2016-03-18 LAB — TRIGLYCERIDES: TRIGLYCERIDES: 992 mg/dL — AB (ref <150)

## 2016-03-18 LAB — PHOSPHORUS: Phosphorus: 4.5 mg/dL (ref 2.5–4.6)

## 2016-03-18 LAB — MAGNESIUM: Magnesium: 1.7 mg/dL (ref 1.7–2.4)

## 2016-03-18 MED ORDER — OXYCODONE HCL 5 MG PO TABS: 5.0000 mg | ORAL_TABLET | ORAL | Status: DC | PRN

## 2016-03-18 MED ORDER — MAGNESIUM SULFATE 2 GM/50ML IV SOLN
2.0000 g | Freq: Once | INTRAVENOUS | Status: AC
  Administered 2016-03-18: 2 g via INTRAVENOUS
  Filled 2016-03-18: qty 50

## 2016-03-18 MED ORDER — POTASSIUM CHLORIDE CRYS ER 20 MEQ PO TBCR
20.0000 meq | EXTENDED_RELEASE_TABLET | Freq: Once | ORAL | Status: AC
  Administered 2016-03-18: 20 meq via ORAL
  Filled 2016-03-18: qty 1

## 2016-03-18 MED ORDER — OXYCODONE HCL 5 MG PO TABS
10.0000 mg | ORAL_TABLET | ORAL | Status: DC | PRN
  Administered 2016-03-18 – 2016-03-19 (×2): 10 mg via ORAL
  Filled 2016-03-18 (×2): qty 2

## 2016-03-18 NOTE — Progress Notes (Signed)
BP low, Dr Lavetta Nielsen notified. No new orders at this time.

## 2016-03-18 NOTE — Care Management Obs Status (Deleted)
[facility] NOTIFICATION   Patient Details  Name: Sergio [name] MRN: [id number] Date of Birth: 11-Jan-1976   Medicare Observation Status Notification Given:  Yes Code [address] RN 03/18/2016, 1:32 PM

## 2016-03-18 NOTE — Care Management (Addendum)
Mistakenly delivered change to observation status meant for another patient. Apoligized to patient for this mistake. Patient smiled and accepted apology.

## 2016-03-18 NOTE — Progress Notes (Signed)
Endocrinology follow up  REQUESTING PROVIDER: Laverle Hobby, MD CONSULTING PROVIDER:  Lavone Orn, MD  History of present illness Sergio [name] is a 40 y.o. male seen in follow up for acute pancreatitis due to hypertriglyceridemia. Abd pain is improved. Tolerating full liquid diet currently which is a creamy broccoli soup. No N/V.  Serum triglycerides are improved at 992 mg/dl.  He is tolerating the fibrate, fish oil, and Crestor. Insulin pump restarted yesterday. Haden has not yet entered carbs for lunch even though when I entered the room he was eating. He was reminded to always enter carbs before meals, preferably 10-15 min prior.Sugars have been in the 70-125 range.    Medical history Past Medical History  Diagnosis Date  . Recurrent acute pancreatitis     secondary to hypertriglyceridemia   . Gastric ulcer 2009  . Sleep apnea 1999    uses CPAP  . Chicken pox   . Diabetes mellitus   . HTN (hypertension)   . Familial hypertriglyceridemia     severe  . Morbid obesity ([place])     s/p bariatric sleeve surgery 01/2015  . Anxiety   . Depression     Surgical history Past Surgical History  Procedure Laterality Date  . Tonsillectomy  2003  . Vasectomy    . Lumbar disc surgery  2008  . Cholecystectomy    . Laparoscopic gastric sleeve resection      Medications . enoxaparin (LOVENOX) injection  40 mg Subcutaneous Q24H  . finasteride  5 mg Oral Daily  . gemfibrozil  600 mg Oral BID AC  . insulin pump   Subcutaneous TID AC, HS, 0200  . omega-3 acid ethyl esters  2 g Oral BID  . pregabalin  300 mg Oral QHS  . rosuvastatin  10 mg Oral q1800  . sertraline  150 mg Oral Daily  . sodium chloride flush  3 mL Intravenous Q12H  . tamsulosin  0.8 mg Oral Daily  . traZODone  100 mg Oral QHS    Social history Social History  Substance Use Topics  . Smoking status: Never Smoker   . Smokeless tobacco: Never Used  . Alcohol Use: No     Comment: once month    Physical  Exam BP 85/46 mmHg  Pulse 58  Temp(Src) 98.4 F (36.9 C) (Oral)  Resp 18  Ht 5\' 10"  (1.778 m)  Wt 112.447 kg (247 lb 14.4 oz)  BMI 35.57 kg/m2  SpO2 96%  GEN: Overweight white male, in NAD.  HEENT: No proptosis, EOMI, lid lag or stare. Oropharynx is clear.  NECK: supple, trachea midline.   RESPIRATORY: clear bilaterally, no wheeze, good inspiratory effort. CV: No carotid bruits, RRR. MUSCULOSKELETAL: normal gait and station, no clubbing, no tremor ABD: soft, minimal TTP EXT: no peripheral edema SKIN: no dermatopathy or rash or acanthosis nigricans. No xanthomas or xanthomatas. LYMPH: no submandibular or supraclavicular LAD NEURO: PERRL. PSYC: alert and oriented, good insight  Labs Results for orders placed or performed during the hospital encounter of 03/11/16 (from the past 24 hour(s))  Glucose, capillary     Status: Abnormal   Collection Time: 03/17/16  2:04 PM  Result Value Ref Range   Glucose-Capillary 105 (H) 65 - 99 mg/dL  Basic metabolic panel     Status: Abnormal   Collection Time: 03/17/16  2:05 PM  Result Value Ref Range   Sodium 135 135 - 145 mmol/L   Potassium 3.5 3.5 - 5.1 mmol/L   Chloride 100 (L) 101 -  111 mmol/L   CO2 29 22 - 32 mmol/L   Glucose, Bld 122 (H) 65 - 99 mg/dL   BUN <5 (L) 6 - 20 mg/dL   Creatinine, Ser 0.88 0.61 - 1.24 mg/dL   Calcium 8.8 (L) 8.9 - 10.3 mg/dL   GFR calc non Af Amer >60 >60 mL/min   GFR calc Af Amer >60 >60 mL/min   Anion gap 6 5 - 15  Glucose, capillary     Status: Abnormal   Collection Time: 03/17/16  3:03 PM  Result Value Ref Range   Glucose-Capillary 104 (H) 65 - 99 mg/dL  Glucose, capillary     Status: None   Collection Time: 03/17/16  4:22 PM  Result Value Ref Range   Glucose-Capillary 91 65 - 99 mg/dL  Glucose, capillary     Status: None   Collection Time: 03/17/16  7:53 PM  Result Value Ref Range   Glucose-Capillary 90 65 - 99 mg/dL  Glucose, capillary     Status: Abnormal   Collection Time: 03/17/16  9:20 PM   Result Value Ref Range   Glucose-Capillary 115 (H) 65 - 99 mg/dL   Comment 1 Notify RN   Glucose, capillary     Status: None   Collection Time: 03/18/16 12:40 AM  Result Value Ref Range   Glucose-Capillary 70 65 - 99 mg/dL   Comment 1 Notify RN   Glucose, capillary     Status: Abnormal   Collection Time: 03/18/16  1:07 AM  Result Value Ref Range   Glucose-Capillary 120 (H) 65 - 99 mg/dL   Comment 1 Notify RN   Triglycerides     Status: Abnormal   Collection Time: 03/18/16  4:51 AM  Result Value Ref Range   Triglycerides 992 (H) <150 mg/dL  Basic metabolic panel     Status: Abnormal   Collection Time: 03/18/16  4:51 AM  Result Value Ref Range   Sodium 137 135 - 145 mmol/L   Potassium 3.5 3.5 - 5.1 mmol/L   Chloride 101 101 - 111 mmol/L   CO2 28 22 - 32 mmol/L   Glucose, Bld 125 (H) 65 - 99 mg/dL   BUN 6 6 - 20 mg/dL   Creatinine, Ser 0.99 0.61 - 1.24 mg/dL   Calcium 8.6 (L) 8.9 - 10.3 mg/dL   GFR calc non Af Amer >60 >60 mL/min   GFR calc Af Amer >60 >60 mL/min   Anion gap 8 5 - 15  Magnesium     Status: None   Collection Time: 03/18/16  4:51 AM  Result Value Ref Range   Magnesium 1.7 1.7 - 2.4 mg/dL  Phosphorus     Status: None   Collection Time: 03/18/16  4:51 AM  Result Value Ref Range   Phosphorus 4.5 2.5 - 4.6 mg/dL  Glucose, capillary     Status: None   Collection Time: 03/18/16  7:33 AM  Result Value Ref Range   Glucose-Capillary 78 65 - 99 mg/dL   Comment 1 Notify RN   Glucose, capillary     Status: Abnormal   Collection Time: 03/18/16 11:32 AM  Result Value Ref Range   Glucose-Capillary 117 (H) 65 - 99 mg/dL   Comment 1 Notify RN     Assessment 1. Acute pancreatitis due to hypertriglyceridemia 2. Familial hypertriglyceridemia 3. Type 2 diabetes mellitus  Plan -Clinically improved. -Continue insulin pump. -As he is now eating, it is expected that serum Tg level will increase. Due to his FH, his  baseline Tg when clinically well is typically in the  1500 - 3000 range. However, determination of clinical progress should not be based on Tg level rather assess his abd pain and tolerance of diet.  -Expect diet to be advanced again tomorrow to Carb Control diet with no concentrated sweets or juice.  -Continue gemfibrozil 600 mg bid, fish oil 4g daily and Crestor 10 mg daily.   Will follow along with you.

## 2016-03-18 NOTE — Progress Notes (Signed)
Pharmacy Consult for Potassium Replacement Indication: Insulin drip for TG  Allergies  Allergen Reactions  . Codeine Anaphylaxis  . Ivp Dye [Iodinated Diagnostic Agents] Other (See Comments)    Kidneys stop working    Patient Measurements: Height: 5\' 10"  (177.8 cm) Weight: 247 lb 14.4 oz (112.447 kg) IBW/kg (Calculated) : 73  Vital Signs: Temp: 98.4 F (36.9 C) (06/08 0358) Temp Source: Oral (06/08 0358) BP: 85/46 mmHg (06/08 0732) Pulse Rate: 58 (06/08 0732) Intake/Output from previous day: 06/07 0701 - 06/08 0700 In: 3395 [I.V.:3395] Out: 2600 [Urine:2600] Intake/Output from this shift:    Labs:  Recent Labs  03/16/16 0438  WBC 2.6*  HGB 12.7*  HCT 37.5*  PLT 190     Recent Labs  03/16/16 0438 03/17/16 0440 03/17/16 1405 03/18/16 0451  NA 136 133* 135 137  K 3.5 3.1* 3.5 3.5  CL 98* 96* 100* 101  CO2 31 30 29 28   GLUCOSE 133* 120* 122* 125*  BUN <5* <5* <5* 6  CREATININE 0.96 0.95 0.88 0.99  CALCIUM 8.9 8.7* 8.8* 8.6*  MG 1.7 1.6*  --  1.7  PHOS  --  4.5  --  4.5  TRIG 1429* 1111*  --  992*   Estimated Creatinine Clearance: 127.1 mL/min (by C-G formula based on Cr of 0.99).    Recent Labs  03/18/16 0040 03/18/16 0107 03/18/16 0733  GLUCAP 70 120* 78     Medications:  Scheduled:  . enoxaparin (LOVENOX) injection  40 mg Subcutaneous Q24H  . finasteride  5 mg Oral Daily  . gemfibrozil  600 mg Oral BID AC  . insulin pump   Subcutaneous TID AC, HS, 0200  . omega-3 acid ethyl esters  2 g Oral BID  . pregabalin  300 mg Oral QHS  . rosuvastatin  10 mg Oral q1800  . sertraline  150 mg Oral Daily  . sodium chloride flush  3 mL Intravenous Q12H  . tamsulosin  0.8 mg Oral Daily  . traZODone  100 mg Oral QHS   Infusions:       Assessment: Pharmacy consulted to assist in managing potassium in this 39 y/o M with pancreatitis secondary to hypertriglyceridemia on insulin drip. Now on insulin pump.  Plan: K 3.5, Mg 1.7, phos 4.5  Will  order magnesium sulfate 2 gm IV x 1 and potassium chloride 20 mEq PO x1.  Will recheck electrolytes with AM labs.   Rocky Morel, Pharm.D., BCPS Clinical Pharmacist 03/18/2016,8:15 AM

## 2016-03-18 NOTE — Progress Notes (Signed)
[facility] NAME: Sergio [name]    MRN#:  [id number]  DATE OF BIRTH:  Apr 10, 1976  SUBJECTIVE:  Hospital Day: 7 days Sergio [name] is a 40 y.o. male presenting with Abdominal Pain .   Overnight events: No overnight events Interval Events:No current issues  REVIEW OF SYSTEMS:  CONSTITUTIONAL: No fever, fatigue or weakness.  EYES: No blurred or double vision.  EARS, NOSE, AND THROAT: No tinnitus or ear pain.  RESPIRATORY: No cough, shortness of breath, wheezing or hemoptysis.  CARDIOVASCULAR: No chest pain, orthopnea, edema.  GASTROINTESTINAL: No nausea, vomiting, diarrhea , positive abdominal pain.  GENITOURINARY: No dysuria, hematuria.  ENDOCRINE: No polyuria, nocturia,  HEMATOLOGY: No anemia, easy bruising or bleeding SKIN: No rash or lesion. MUSCULOSKELETAL: No joint pain or arthritis.   NEUROLOGIC: No tingling, numbness, weakness.  PSYCHIATRY: Positive anxiety denies depression.   DRUG ALLERGIES:   Allergies  Allergen Reactions  . Codeine Anaphylaxis  . Ivp Dye [Iodinated Diagnostic Agents] Other (See Comments)    Kidneys stop working    VITALS:  Blood pressure 85/46, pulse 58, temperature 98.4 F (36.9 C), temperature source Oral, resp. rate 18, height 5\' 10"  (1.778 m), weight 247 lb 14.4 oz (112.447 kg), SpO2 96 %.  PHYSICAL EXAMINATION:  VITAL SIGNS: Filed Vitals:   03/18/16 0730 03/18/16 0732  BP: 87/42 85/46  Pulse: 58 58  Temp:    Resp:  18   GENERAL:40 y.o.male currently in no acute distress.  HEAD: Normocephalic, atraumatic.  EYES: Pupils equal, round, reactive to light. Extraocular muscles intact. No scleral icterus.  MOUTH: Moist mucosal membrane. Dentition intact. No abscess noted.  EAR, NOSE, THROAT: Clear without exudates. No external lesions.  NECK: Supple. No thyromegaly. No nodules. No JVD.  PULMONARY: Clear to ascultation, without wheeze rails or rhonci. No use of accessory muscles,  Good respiratory effort. good air entry bilaterally CHEST: Nontender to palpation.  CARDIOVASCULAR: S1 and S2. Regular rate and rhythm. No murmurs, rubs, or gallops. No edema. Pedal pulses 2+ bilaterally.  GASTROINTESTINAL: Soft, minimal tenderness on palpation without rebound or guarding, nondistended. No masses. Positive bowel sounds. No hepatosplenomegaly.  MUSCULOSKELETAL: No swelling, clubbing, or edema. Range of motion full in all extremities.  NEUROLOGIC: Cranial nerves II through XII are intact. No gross focal neurological deficits. Sensation intact. Reflexes intact.  SKIN: No ulceration, lesions, rashes, or cyanosis. Skin warm and dry. Turgor intact.  PSYCHIATRIC: Mood, affect within normal limits. The patient is awake, alert and oriented x 3. Insight, judgment intact.      LABORATORY PANEL:   CBC  Recent Labs Lab 03/16/16 0438  WBC 2.6*  HGB 12.7*  HCT 37.5*  PLT 190   ------------------------------------------------------------------------------------------------------------------  Chemistries   Recent Labs Lab 03/18/16 0451  NA 137  K 3.5  CL 101  CO2 28  GLUCOSE 125*  BUN 6  CREATININE 0.99  CALCIUM 8.6*  MG 1.7   ------------------------------------------------------------------------------------------------------------------  Cardiac Enzymes No results for input(s): TROPONINI in the last 168 hours. ------------------------------------------------------------------------------------------------------------------  RADIOLOGY:  No results found.  EKG:   Orders placed or performed during the hospital encounter of 03/11/16  . ED EKG  . ED EKG  . EKG 12-Lead  . EKG 12-Lead    ASSESSMENT AND PLAN:   Sergio [name] is a 40 y.o. male presenting with Abdominal Pain . Admitted 03/11/2016 : Day #: 7 days  1. Acute on chronic pancreatitis secondary to severe hypertriglyceridemia: back on insulin pump. Endocrine input  Appreciated, and advance diet 2.  diabetes mellitus type 2 insulin requiring: Insulin pump  4. Anxiety NOS: Psychiatry input appreciated continue zoloft 5. Early urinary retention: resolved  All the records are reviewed and case discussed with Care Management/Social Workerr. Management plans discussed with the patient, family and they are in agreement.  CODE STATUS: full TOTAL TIME TAKING CARE OF THIS PATIENT: 28 minutes.   POSSIBLE D/C IN 2-3DAYS, DEPENDING ON CLINICAL CONDITION.   Sergio [name],  Sergio [name] on 03/18/2016 at 1:22 PM  Between 7am to 6pm - Pager - [phone]  After 6pm: House Pager: - [phone]  [facility]  Office  [phone]  CC: Primary care physician; Crecencio Mc, MD

## 2016-03-19 LAB — BASIC METABOLIC PANEL
Anion gap: 6 (ref 5–15)
BUN: 9 mg/dL (ref 6–20)
CO2: 28 mmol/L (ref 22–32)
Calcium: 8.9 mg/dL (ref 8.9–10.3)
Chloride: 103 mmol/L (ref 101–111)
Creatinine, Ser: 0.82 mg/dL (ref 0.61–1.24)
GFR calc non Af Amer: >60 mL/min (ref >60)
Glucose, Bld: 107 mg/dL — ABNORMAL HIGH (ref 65–99)
Potassium: 3.8 mmol/L (ref 3.5–5.1)
SODIUM: 137 mmol/L (ref 135–145)

## 2016-03-19 LAB — GLUCOSE, CAPILLARY
Glucose-Capillary: 89 mg/dL (ref 65–99)
Glucose-Capillary: 178 mg/dL — ABNORMAL HIGH (ref 65–99)

## 2016-03-19 LAB — TRIGLYCERIDES: TRIGLYCERIDES: 889 mg/dL — AB (ref <150)

## 2016-03-19 LAB — MAGNESIUM: MAGNESIUM: 1.9 mg/dL (ref 1.7–2.4)

## 2016-03-19 LAB — PHOSPHORUS: PHOSPHORUS: 4.3 mg/dL (ref 2.5–4.6)

## 2016-03-19 MED ORDER — SERTRALINE HCL 100 MG PO TABS: 150.0000 mg | ORAL_TABLET | Freq: Every day | ORAL | Status: DC

## 2016-03-19 MED ORDER — OXYCODONE HCL 10 MG PO TABS: 10.0000 mg | ORAL_TABLET | Freq: Four times a day (QID) | ORAL | Status: DC | PRN

## 2016-03-19 NOTE — Progress Notes (Signed)
   Sachse Harmon,  [id number]  March 19, 2016  Patient:  Sergio [name] Date of Birth: 13-Apr-1976 Date of Visit:  03/11/2016  To Whom it May Concern:  Please excuse MACUS DILLE from work from 03/11/2016 until 03/19/2016 as he was admitted to the [facility] for medical treatment and has been receiving appropriate care. He may return to work on 03/24/16, sooner if he feels he is able to return sooner than this date.      Please don't hesitate to contact me with questions or concerns by calling  [phone] and asking them to page me directly.   Regino Schultze, MD

## 2016-03-19 NOTE — Progress Notes (Signed)
Reviewed discharge summary with understanding.

## 2016-03-19 NOTE — Progress Notes (Signed)
Advance diet - potential DC today

## 2016-03-19 NOTE — Progress Notes (Signed)
Pharmacy Consult for Potassium Replacement Indication: Insulin drip for TG  Allergies  Allergen Reactions  . Codeine Anaphylaxis  . Ivp Dye [Iodinated Diagnostic Agents] Other (See Comments)    Kidneys stop working    Patient Measurements: Height: 5\' 10"  (177.8 cm) Weight: 250 lb 1.8 oz (113.45 kg) IBW/kg (Calculated) : 73  Vital Signs: Temp: 98.4 F (36.9 C) (06/09 0725) Temp Source: Oral (06/09 0725) BP: 114/91 mmHg (06/09 0725) Pulse Rate: 68 (06/09 0725) Intake/Output from previous day: 06/08 0701 - 06/09 0700 In: 1320 [P.O.:1320] Out: 0  Intake/Output from this shift:    Labs: No results for input(s): WBC, HGB, HCT, PLT, APTT, INR in the last 72 hours.   Recent Labs  03/17/16 0440 03/17/16 1405 03/18/16 0451 03/19/16 0416  NA 133* 135 137 137  K 3.1* 3.5 3.5 3.8  CL 96* 100* 101 103  CO2 30 29 28 28   GLUCOSE 120* 122* 125* 107*  BUN <5* <5* 6 9  CREATININE 0.95 0.88 0.99 0.82  CALCIUM 8.7* 8.8* 8.6* 8.9  MG 1.6*  --  1.7 1.9  PHOS 4.5  --  4.5 4.3  TRIG 1111*  --  992* 889*   Estimated Creatinine Clearance: 154.1 mL/min (by C-G formula based on Cr of 0.82).    Recent Labs  03/18/16 1132 03/18/16 1604 03/19/16 0727  GLUCAP 117* 107* 89     Medications:  Scheduled:  . enoxaparin (LOVENOX) injection  40 mg Subcutaneous Q24H  . finasteride  5 mg Oral Daily  . gemfibrozil  600 mg Oral BID AC  . insulin pump   Subcutaneous TID AC, HS, 0200  . omega-3 acid ethyl esters  2 g Oral BID  . pregabalin  300 mg Oral QHS  . rosuvastatin  10 mg Oral q1800  . sertraline  150 mg Oral Daily  . sodium chloride flush  3 mL Intravenous Q12H  . tamsulosin  0.8 mg Oral Daily  . traZODone  100 mg Oral QHS   Infusions:     Assessment: Pharmacy consulted to assist in managing potassium in this 40 y/o M with pancreatitis secondary to hypertriglyceridemia on insulin drip. Now on insulin pump.  K: 3.8, Mag: 1.8, Phos: 4.3  Plan: No supplementation warranted  at this time. Will recheck labs in AM.   Pharmacy will continue to follow.     Deborh Pense G, Pharm.D., BCPS Clinical Pharmacist 03/19/2016,7:35 AM

## 2016-03-19 NOTE — Discharge Summary (Signed)
[facility] NAME: Sergio [name]    MR#:  [id number]  DATE OF BIRTH:  [birth year]  DATE OF ADMISSION:  03/11/2016 ADMITTING PHYSICIAN: Harrie Foreman, MD  DATE OF DISCHARGE: 03/19/2016  PRIMARY CARE PHYSICIAN: Crecencio Mc, MD    ADMISSION DIAGNOSIS:  Other acute pancreatitis, unspecified complication status [id number]  DISCHARGE DIAGNOSIS:  Acute pancreatitis secondary to hypertriglyceridemia    Generalized anxiety disorder      SECONDARY DIAGNOSIS:   Past Medical History  Diagnosis Date  . Recurrent acute pancreatitis     secondary to hypertriglyceridemia   . Gastric ulcer 2009  . Sleep apnea 1999    uses CPAP  . Chicken pox   . Diabetes mellitus   . HTN (hypertension)   . Familial hypertriglyceridemia     severe  . Morbid obesity ([place])     s/p bariatric sleeve surgery 01/2015  . Anxiety   . Depression     HOSPITAL COURSE:  Sergio [name]  is a 40 y.o. male admitted 03/11/2016 with chief complaint Abdominal Pain . Please see H&P performed by Harrie Foreman, MD for further information. Admitted with the above complaints initial triglycerides >5000. Evaluated by endocrinology who assisted in triglyceride management. Continued on insulin drip until triglyceride level <1000 transitioned back to insulin pump at that time. He has been on his insulin pump for 2 days, and tolerating diet prior to discharge. Evaluated by psychiatry during his stay due to anxiety, who increased his dose of zoloft. DISCHARGE CONDITIONS:   stable  CONSULTS OBTAINED:  Treatment Team:  Abby Percell Locus, MD Gonzella Lex, MD  DRUG ALLERGIES:   Allergies  Allergen Reactions  . Codeine Anaphylaxis  . Ivp Dye [Iodinated Diagnostic Agents] Other (See Comments)    Kidneys stop working    DISCHARGE MEDICATIONS:   Current Discharge Medication List    CONTINUE these medications which have CHANGED   Details  Oxycodone HCl 10 MG  TABS Take 1 tablet (10 mg total) by mouth every 6 (six) hours as needed. Qty: 30 tablet, Refills: 0    sertraline (ZOLOFT) 100 MG tablet Take 1.5 tablets (150 mg total) by mouth daily. Qty: 60 tablet, Refills: 0      CONTINUE these medications which have NOT CHANGED   Details  ALPRAZolam (XANAX) 0.5 MG tablet Take 1 tablet (0.5 mg total) by mouth 2 (two) times daily as needed for anxiety. Qty: 60 tablet, Refills: 0    finasteride (PROSCAR) 5 MG tablet Take 1 tablet (5 mg total) by mouth daily. Qty: 30 tablet, Refills: 0    gemfibrozil (LOPID) 600 MG tablet Take 1 tablet (600 mg total) by mouth 2 (two) times daily before a meal. Qty: 60 tablet, Refills: 0    insulin aspart (NOVOLOG) 100 UNIT/ML injection Inject 1 Dose into the skin See admin instructions. Use up to 120 units daily via insulin pump    omega-3 acid ethyl esters (LOVAZA) 1 g capsule Take 4 g by mouth at bedtime.    pregabalin (LYRICA) 300 MG capsule Take 300 mg by mouth at bedtime.     rosuvastatin (CRESTOR) 10 MG tablet Take 1 tablet (10 mg total) by mouth daily at 6 PM. Qty: 30 tablet, Refills: 0    tamsulosin (FLOMAX) 0.4 MG CAPS capsule Take 1 capsule (0.4 mg total) by mouth daily. Qty: 30 capsule, Refills: 0    traZODone (DESYREL) 100 MG tablet Take 100 mg by mouth  at bedtime.         DISCHARGE INSTRUCTIONS:    DIET:  Diabetic diet  DISCHARGE CONDITION:  Stable  ACTIVITY:  Activity as tolerated  OXYGEN:  Home Oxygen: No.   Oxygen Delivery: room air  DISCHARGE LOCATION:  home   If you experience worsening of your admission symptoms, develop shortness of breath, life threatening emergency, suicidal or homicidal thoughts you must seek medical attention immediately by calling 911 or calling your MD immediately  if symptoms less severe.  You Must read complete instructions/literature along with all the possible adverse reactions/side effects for all the Medicines you take and that have been  prescribed to you. Take any new Medicines after you have completely understood and accpet all the possible adverse reactions/side effects.   Please note  You were cared for by a hospitalist during your hospital stay. If you have any questions about your discharge medications or the care you received while you were in the hospital after you are discharged, you can call the unit and asked to speak with the hospitalist on call if the hospitalist that took care of you is not available. Once you are discharged, your primary care physician will handle any further medical issues. Please note that NO REFILLS for any discharge medications will be authorized once you are discharged, as it is imperative that you return to your primary care physician (or establish a relationship with a primary care physician if you do not have one) for your aftercare needs so that they can reassess your need for medications and monitor your lab values.    On the day of Discharge:   VITAL SIGNS:  Blood pressure 114/91, pulse 68, temperature 98.4 F (36.9 C), temperature source Oral, resp. rate 18, height 5\' 10"  (1.778 m), weight 250 lb 1.8 oz (113.45 kg), SpO2 100 %.  I/O:   Intake/Output Summary (Last 24 hours) at 03/19/16 1241 Last data filed at 03/19/16 0400  Gross per 24 hour  Intake   1080 ml  Output      0 ml  Net   1080 ml    PHYSICAL EXAMINATION:  GENERAL:  40 y.o.-year-old patient lying in the bed with no acute distress.  EYES: Pupils equal, round, reactive to light and accommodation. No scleral icterus. Extraocular muscles intact.  HEENT: Head atraumatic, normocephalic. Oropharynx and nasopharynx clear.  NECK:  Supple, no jugular venous distention. No thyroid enlargement, no tenderness.  LUNGS: Normal breath sounds bilaterally, no wheezing, rales,rhonchi or crepitation. No use of accessory muscles of respiration.  CARDIOVASCULAR: S1, S2 normal. No murmurs, rubs, or gallops.  ABDOMEN: Soft, non-tender,  non-distended. Bowel sounds present. No organomegaly or mass.  EXTREMITIES: No pedal edema, cyanosis, or clubbing.  NEUROLOGIC: Cranial nerves II through XII are intact. Muscle strength 5/5 in all extremities. Sensation intact. Gait not checked.  PSYCHIATRIC: The patient is alert and oriented x 3.  SKIN: No obvious rash, lesion, or ulcer.   DATA REVIEW:   CBC  Recent Labs Lab 03/16/16 0438  WBC 2.6*  HGB 12.7*  HCT 37.5*  PLT 190    Chemistries   Recent Labs Lab 03/19/16 0416  NA 137  K 3.8  CL 103  CO2 28  GLUCOSE 107*  BUN 9  CREATININE 0.82  CALCIUM 8.9  MG 1.9    Cardiac Enzymes No results for input(s): TROPONINI in the last 168 hours.  Microbiology Results  Results for orders placed or performed during the hospital encounter of 03/11/16  MRSA  PCR Screening     Status: None   Collection Time: 03/11/16  5:21 AM  Result Value Ref Range Status   MRSA by PCR NEGATIVE NEGATIVE Final    RADIOLOGY:  No results found.   Management plans discussed with the patient, family and they are in agreement.  CODE STATUS:     Code Status Orders        Start     Ordered   03/11/16 0459  Full code   Continuous     03/11/16 0458    Code Status History    Date Active Date Inactive Code Status Order ID Comments User Context   01/17/2016  2:50 AM 01/27/2016  5:47 PM Full Code [id number]  Harrie Foreman, MD Inpatient   08/01/2015 11:35 PM 08/15/2015  4:43 PM Full Code [id number]  Lytle Butte, MD ED   07/21/2014 11:46 AM 07/27/2014  6:16 PM Full Code [id number]  Oswald Hillock, MD Inpatient      TOTAL TIME TAKING CARE OF THIS PATIENT: 28 minutes.    Jenea Dake,  [name].D on 03/19/2016 at 12:41 PM  Between 7am to 6pm - Pager - [phone]  After 6pm go to [email or web address] - Proofreader  [facility]  Office  [phone]  CC: Primary care physician; Crecencio Mc, MD

## 2016-03-22 ENCOUNTER — Telehealth: Payer: Self-pay

## 2016-03-22 NOTE — Telephone Encounter (Signed)
No transitional care management at this time.  [employer].

## 2016-04-28 ENCOUNTER — Ambulatory Visit (INDEPENDENT_AMBULATORY_CARE_PROVIDER_SITE_OTHER): Payer: BLUE CROSS/BLUE SHIELD | Admitting: Internal Medicine

## 2016-04-28 ENCOUNTER — Encounter: Payer: Self-pay | Admitting: Internal Medicine

## 2016-04-28 VITALS — BP 138/88 | HR 89 | Temp 98.6°F | Resp 10 | Ht 70.5 in | Wt 238.5 lb

## 2016-04-28 DIAGNOSIS — F411 Generalized anxiety disorder: Secondary | ICD-10-CM | POA: Diagnosis not present

## 2016-04-28 DIAGNOSIS — Z0000 Encounter for general adult medical examination without abnormal findings: Secondary | ICD-10-CM | POA: Diagnosis not present

## 2016-04-28 DIAGNOSIS — R197 Diarrhea, unspecified: Secondary | ICD-10-CM

## 2016-04-28 DIAGNOSIS — E781 Pure hyperglyceridemia: Secondary | ICD-10-CM

## 2016-04-28 DIAGNOSIS — I10 Essential (primary) hypertension: Secondary | ICD-10-CM

## 2016-04-28 MED ORDER — CLONAZEPAM 0.5 MG PO TABS
0.5000 mg | ORAL_TABLET | Freq: Every day | ORAL | Status: DC
Start: 2016-04-28 — End: 2016-08-09

## 2016-04-28 NOTE — Patient Instructions (Signed)
I am prescribing clonazepam for your afternoon irritability.  You can cut the tablet in half if it is too sedating  It can be taken twice daily if necessary,  Just let me know if you need to increase it to twice daily  Clonazepam tablets What is this medicine? CLONAZEPAM (kloe NA ze pam) is a benzodiazepine. It is used to treat certain types of seizures. It is also used to treat panic disorder. This medicine may be used for other purposes; ask your health care provider or pharmacist if you have questions. What should I tell my health care provider before I take this medicine? They need to know if you have any of these conditions: -an alcohol or drug abuse problem -bipolar disorder, depression, psychosis or other mental health condition -glaucoma -kidney or liver disease -lung or breathing disease -myasthenia gravis -Parkinson's disease -porphyria -seizures or a history of seizures -suicidal thoughts -an unusual or allergic reaction to clonazepam, other benzodiazepines, foods, dyes, or preservatives -pregnant or trying to get pregnant -breast-feeding How should I use this medicine? Take this medicine by mouth with a glass of water. Follow the directions on the prescription label. If it upsets your stomach, take it with food or milk. Take your medicine at regular intervals. Do not take it more often than directed. Do not stop taking or change the dose except on the advice of your doctor or health care professional. A special MedGuide will be given to you by the pharmacist with each prescription and refill. Be sure to read this information carefully each time. Talk to your pediatrician regarding the use of this medicine in children. Special care may be needed. Overdosage: If you think you have taken too much of this medicine contact a poison control center or emergency room at once. NOTE: This medicine is only for you. Do not share this medicine with others. What if I miss a dose? If you  miss a dose, take it as soon as you can. If it is almost time for your next dose, take only that dose. Do not take double or extra doses. What may interact with this medicine? -herbal or dietary supplements -medicines for depression, anxiety, or psychotic disturbances -medicines for fungal infections like fluconazole, itraconazole, ketoconazole, voriconazole -medicines for HIV infection or AIDS -medicines for sleep -prescription pain medicines -propantheline -rifampin -sevelamer -some medicines for seizures like carbamazepine, phenobarbital, phenytoin, primidone This list may not describe all possible interactions. Give your health care provider a list of all the medicines, herbs, non-prescription drugs, or dietary supplements you use. Also tell them if you smoke, drink alcohol, or use illegal drugs. Some items may interact with your medicine. What should I watch for while using this medicine? Visit your doctor or health care professional for regular checks on your progress. Your body may become dependent on this medicine. If you have been taking this medicine regularly for some time, do not suddenly stop taking it. You must gradually reduce the dose or you may get severe side effects. Ask your doctor or health care professional for advice before increasing or decreasing the dose. Even after you stop taking this medicine it can still affect your body for several days. If you suffer from several types of seizures, this medicine may increase the chance of grand mal seizures (epilepsy). Let your doctor or health care professional know, he or she may want to prescribe an additional medicine. You may get drowsy or dizzy. Do not drive, use machinery, or do anything that needs  mental alertness until you know how this medicine affects you. To reduce the risk of dizzy and fainting spells, do not stand or sit up quickly, especially if you are an older patient. Alcohol may increase dizziness and drowsiness.  Avoid alcoholic drinks. Do not treat yourself for coughs, colds or allergies without asking your doctor or health care professional for advice. Some ingredients can increase possible side effects. The use of this medicine may increase the chance of suicidal thoughts or actions. Pay special attention to how you are responding while on this medicine. Any worsening of mood, or thoughts of suicide or dying should be reported to your health care professional right away. Women who become pregnant while using this medicine may enroll in the [place] Pregnancy Registry by calling [phone]. This registry collects information about the safety of antiepileptic drug use during pregnancy. What side effects may I notice from receiving this medicine? Side effects that you should report to your doctor or health care professional as soon as possible: -allergic reactions like skin rash, itching or hives, swelling of the face, lips, or tongue -changes in vision -confusion -depression -hallucinations -mood changes, excitability or aggressive behavior -movement difficulty, staggering or jerky movements -muscle cramps, weakness -tremors -unusual eye movements Side effects that usually do not require medical attention (report to your doctor or health care professional if they continue or are bothersome): -constipation or diarrhea -difficulty sleeping, nightmares -dizziness, drowsiness -headache -increased saliva from your mouth -nausea, vomiting This list may not describe all possible side effects. Call your doctor for medical advice about side effects. You may report side effects to FDA at 1-800-FDA-1088. Where should I keep my medicine? Keep out of the reach of children. This medicine can be abused. Keep your medicine in a safe place to protect it from theft. Do not share this medicine with anyone. Selling or giving away this medicine is dangerous and against the law. This  medicine may cause accidental overdose and death if taken by other adults, children, or pets. Mix any unused medicine with a substance like cat litter or coffee grounds. Then throw the medicine away in a sealed container like a sealed bag or a coffee can with a lid. Do not use the medicine after the expiration date. Store at room temperature between 15 and 30 degrees C (59 and 86 degrees F). Protect from light. Keep container tightly closed. NOTE: This sheet is a summary. It may not cover all possible information. If you have questions about this medicine, talk to your doctor, pharmacist, or health care provider.    2016, Elsevier/Gold Standard. (2015-01-07 14:01:43)

## 2016-04-28 NOTE — Progress Notes (Signed)
Patient ID: Sergio [name], male    DOB: 11/07/1975  Age: 40 y.o. MRN: [id number]  The patient is here for annual Medicare wellness examination and management of other chronic and acute problems.   The risk factors are reflected in the social history.  The roster of all physicians providing medical care to patient - is listed in the Snapshot section of the chart.  Activities of daily living:  The patient is 100% independent in all ADLs: dressing, toileting, feeding as well as independent mobility  Home safety : The patient has smoke detectors in the home. They wear seatbelts.  There are no firearms at home. There is no violence in the home.   There is no risks for hepatitis, STDs or HIV. There is no   history of blood transfusion. They have no travel history to infectious disease endemic areas of the world.  The patient has seen their dentist in the last six month. They have seen their eye doctor in the last year. They admit to slight hearing difficulty with regard to whispered voices and some television programs.  They have deferred audiologic testing in the last year.  They do not  have excessive sun exposure. Discussed the need for sun protection: hats, long sleeves and use of sunscreen if there is significant sun exposure.   Diet: the importance of a healthy diet is discussed. They do have a healthy diet.  The benefits of regular aerobic exercise were discussed. She walks 4 times per week ,  20 minutes.   Depression screen: there are no signs or vegative symptoms of depression- irritability, change in appetite, anhedonia, sadness/tearfullness.  Cognitive assessment: the patient manages all their financial and personal affairs and is actively engaged. They could relate day,date,year and events; recalled 2/3 objects at 3 minutes; performed clock-face test normally.  The following portions of the patient's history were reviewed and updated as appropriate: allergies, current medications, past  family history, past medical history,  past surgical history, past social history  and problem list.  Visual acuity was not assessed per patient preference since she has regular follow up with her ophthalmologist. Hearing and body mass index were assessed and reviewed.   During the course of the visit the patient was educated and counseled about appropriate screening and preventive services including : fall prevention , diabetes screening, nutrition counseling, colorectal cancer screening, and recommended immunizations.    CC: The primary encounter diagnosis was Encounter for preventive health examination. Diagnoses of Diarrhea, unspecified type, Generalized anxiety disorder, Essential hypertension, and Hypertriglyceridemia were also pertinent to this visit.  History Sergio [name] has a past medical history of Recurrent acute pancreatitis; Gastric ulcer (2009); Sleep apnea (1999); Chicken pox; Diabetes mellitus; HTN (hypertension); Familial hypertriglyceridemia; Morbid obesity ([place]); Anxiety; and Depression.   He has past surgical history that includes Tonsillectomy (2003); Vasectomy; Lumbar disc surgery (2008); Cholecystectomy; and Laparoscopic gastric sleeve resection.   His family history includes Hyperlipidemia in his father and mother; Hypertension in his father and mother. He was adopted.He reports that he has never smoked. He has never used smokeless tobacco. He reports that he does not drink alcohol or use illicit drugs.  Outpatient Prescriptions Prior to Visit  Medication Sig Dispense Refill  . ALPRAZolam (XANAX) 0.5 MG tablet Take 1 tablet (0.5 mg total) by mouth 2 (two) times daily as needed for anxiety. 60 tablet 0  . finasteride (PROSCAR) 5 MG tablet Take 1 tablet (5 mg total) by mouth daily. 30 tablet 0  . gemfibrozil (  LOPID) 600 MG tablet Take 1 tablet (600 mg total) by mouth 2 (two) times daily before a meal. 60 tablet 0  . insulin aspart (NOVOLOG) 100 UNIT/ML injection Inject 1 Dose into  the skin See admin instructions. Use up to 120 units daily via insulin pump    . omega-3 acid ethyl esters (LOVAZA) 1 g capsule Take 4 g by mouth at bedtime.    . pregabalin (LYRICA) 300 MG capsule Take 300 mg by mouth at bedtime.     . rosuvastatin (CRESTOR) 10 MG tablet Take 1 tablet (10 mg total) by mouth daily at 6 PM. 30 tablet 0  . sertraline (ZOLOFT) 100 MG tablet Take 1.5 tablets (150 mg total) by mouth daily. 60 tablet 0  . tamsulosin (FLOMAX) 0.4 MG CAPS capsule Take 1 capsule (0.4 mg total) by mouth daily. 30 capsule 0  . traZODone (DESYREL) 100 MG tablet Take 100 mg by mouth at bedtime.    . Oxycodone HCl 10 MG TABS Take 1 tablet (10 mg total) by mouth every 6 (six) hours as needed. (Patient not taking: Reported on 04/28/2016) 30 tablet 0   No facility-administered medications prior to visit.    Review of Systems  Objective:  BP 138/88 mmHg  Pulse 89  Temp(Src) 98.6 F (37 C) (Oral)  Resp 10  Ht 5' 10.5" (1.791 m)  Wt 238 lb 8 oz (108.183 kg)  BMI 33.73 kg/m2  SpO2 97%  Physical Exam    Assessment & Plan:   Problem List Items Addressed This Visit    Hypertriglyceridemia    Now managed with Crestor, Lopid and fish oil.  Lab Results  Component Value Date   CHOL 899* 03/10/2016   HDL 16* 03/10/2016   LDLCALC UNABLE TO CALCULATE IF TRIGLYCERIDE OVER 400 mg/dL 03/10/2016   LDLDIRECT 111.4 06/30/2012   TRIG 889* 03/19/2016   CHOLHDL 56.2 03/10/2016          HTN (hypertension)    he has no history of hypertension but has had several elevated readings.  She has been asked to check her pressures at home and submit readings for evaluation. Renal function will be checked today.  . Lab Results  Component Value Date   CREATININE 0.82 03/19/2016   Lab Results  Component Value Date   NA 137 03/19/2016   K 3.8 03/19/2016   CL 103 03/19/2016   CO2 28 03/19/2016         Generalized anxiety disorder    Adding low  Dose clonazepam for late afternoon  irritability .  Continue zoloft 150 mg daily alprazolam dc'd       Diarrhea    Given his recurrrent pancreatitis,  He is at risk for loss of exocrine function leading to chronic diarrhea. Current symptoms have been present for  < 48 hours and appear to be viral.  immodium advised,  BRAT diet.       Encounter for preventive health examination - Primary    Annual comprehensive preventive exam was done as well as an evaluation and management of acute and chronic conditions .  During the course of the visit the patient was educated and counseled about appropriate screening and preventive services including :  diabetes screening, lipid analysis with projected  10 year  risk for CAD , nutrition counseling, prostate and colorectal cancer screening, and recommended immunizations.  Printed recommendations for health maintenance screenings was given.          I have discontinued Sergio [name] Oxycodone  HCl. I am also having him start on clonazePAM. Additionally, I am having him maintain his gemfibrozil, rosuvastatin, ALPRAZolam, pregabalin, traZODone, insulin aspart, omega-3 acid ethyl esters, finasteride, tamsulosin, and sertraline.  Meds ordered this encounter  Medications  . clonazePAM (KLONOPIN) 0.5 MG tablet    Sig: Take 1 tablet (0.5 mg total) by mouth daily. In the afternoon for anxiety    Dispense:  30 tablet    Refill:  1    Medications Discontinued During This Encounter  Medication Reason  . Oxycodone HCl 10 MG TABS     Follow-up: No Follow-up on file.   Crecencio Mc, MD

## 2016-04-28 NOTE — Progress Notes (Signed)
Pre-visit discussion using our clinic review tool. No additional management support is needed unless otherwise documented below in the visit note.  

## 2016-05-01 DIAGNOSIS — Z0000 Encounter for general adult medical examination without abnormal findings: Secondary | ICD-10-CM | POA: Insufficient documentation

## 2016-05-01 NOTE — Assessment & Plan Note (Addendum)
he has no history of hypertension but has had several elevated readings.  She has been asked to check her pressures at home and submit readings for evaluation. Renal function will be checked today.  . Lab Results  Component Value Date   CREATININE 0.82 03/19/2016   Lab Results  Component Value Date   NA 137 03/19/2016   K 3.8 03/19/2016   CL 103 03/19/2016   CO2 28 03/19/2016

## 2016-05-01 NOTE — Assessment & Plan Note (Addendum)
Now managed with Crestor, Lopid and fish oil.  Lab Results  Component Value Date   CHOL 899* 03/10/2016   HDL 16* 03/10/2016   LDLCALC UNABLE TO CALCULATE IF TRIGLYCERIDE OVER 400 mg/dL 03/10/2016   LDLDIRECT 111.4 06/30/2012   TRIG 889* 03/19/2016   CHOLHDL 56.2 03/10/2016

## 2016-05-01 NOTE — Progress Notes (Signed)
Patient ID: Sergio [name], male    DOB: 1975-12-01  Age: 40 y.o. MRN: [id number]  The patient is here for annual physical  examination and management of other chronic and acute problems.   The risk factors are reflected in the social history.  The roster of all physicians providing medical care to patient - is listed in the Snapshot section of the chart.   Home safety : The patient has smoke detectors in the home. They wear seatbelts.  There are no firearms at home. There is no violence in the home.   There is no risks for hepatitis, STDs or HIV. There is no   history of blood transfusion. They have no travel history to infectious disease endemic areas of the world.  The patient has seen their dentist in the last six month. They have seen their eye doctor in the last year.   They do not  have excessive sun exposure. Discussed the need for sun protection: hats, long sleeves and use of sunscreen if there is significant sun exposure.   Diet: the importance of a healthy diet is discussed. They do have a healthy diet.  The benefits of regular aerobic exercise were discussed. He does not erxercise regularly .   Depression screen: there are  signs or vegative symptoms of depression- irritability, change in appetite, without anhedonia, sadness or tearfullness.  The following portions of the patient's history were reviewed and updated as appropriate: allergies, current medications, past family history, past medical history,  past surgical history, past social history  and problem list.  Visual acuity was not assessed per patient preference since she has regular follow up with her ophthalmologist. Hearing and body mass index were assessed and reviewed.   During the course of the visit the patient was educated and counseled about appropriate screening and preventive services including : fall prevention , diabetes screening, nutrition counseling, colorectal cancer screening, and recommended  immunizations.    CC: The primary encounter diagnosis was Encounter for preventive health examination. Diagnoses of Diarrhea, unspecified type, Generalized anxiety disorder, Essential hypertension, and Hypertriglyceridemia were also pertinent to this visit.  Has been feeling poorly since Monday.  Migraine type headache with nausea,  Abdominal  Pain very unlike his history of recurrent abdominal pain due to pancreatitis. Symptoms  started on Monday after working the first full day after a weeklong vacation with family camping in [place].  o other family members are ill.  The headache has resolved. His wife thought he felt feverish the night before but he has not felt feverish in the last 24 hours . Other symptoms include fatigue and loose stools which started yesterday unaccompanied by lower abdominal cramping or blood in stools. He has not eaten sice yesterday and  stools still liquid .  Removed a tick 3 weeks ago.  Tick was not engorged,.  Anxiety  His zoloft increased to 150 mg during last hospitalization for symptoms of uncontrolled anxiety . He notes improvement during the day at work but has increased irritability at the end of the day when he comes home to wife and children.   2 hospitalizations  Since last visit, both  This summer 6 weeks apart , due to acute ancreatitis   2nd episode was "stress induced"  Because he had not stoppeped taking gemfibrozil or Crestor     History Hau has a past medical history of Recurrent acute pancreatitis; Gastric ulcer (2009); Sleep apnea (1999); Chicken pox; Diabetes mellitus; HTN (hypertension); Familial hypertriglyceridemia; Morbid obesity (  [place]); Anxiety; and Depression.   He has past surgical history that includes Tonsillectomy (2003); Vasectomy; Lumbar disc surgery (2008); Cholecystectomy; and Laparoscopic gastric sleeve resection.   His family history includes Hyperlipidemia in his father and mother; Hypertension in his father and mother. He was adopted.He  reports that he has never smoked. He has never used smokeless tobacco. He reports that he does not drink alcohol or use illicit drugs.  Outpatient Prescriptions Prior to Visit  Medication Sig Dispense Refill  . ALPRAZolam (XANAX) 0.5 MG tablet Take 1 tablet (0.5 mg total) by mouth 2 (two) times daily as needed for anxiety. 60 tablet 0  . finasteride (PROSCAR) 5 MG tablet Take 1 tablet (5 mg total) by mouth daily. 30 tablet 0  . gemfibrozil (LOPID) 600 MG tablet Take 1 tablet (600 mg total) by mouth 2 (two) times daily before a meal. 60 tablet 0  . insulin aspart (NOVOLOG) 100 UNIT/ML injection Inject 1 Dose into the skin See admin instructions. Use up to 120 units daily via insulin pump    . omega-3 acid ethyl esters (LOVAZA) 1 g capsule Take 4 g by mouth at bedtime.    . pregabalin (LYRICA) 300 MG capsule Take 300 mg by mouth at bedtime.     . rosuvastatin (CRESTOR) 10 MG tablet Take 1 tablet (10 mg total) by mouth daily at 6 PM. 30 tablet 0  . sertraline (ZOLOFT) 100 MG tablet Take 1.5 tablets (150 mg total) by mouth daily. 60 tablet 0  . tamsulosin (FLOMAX) 0.4 MG CAPS capsule Take 1 capsule (0.4 mg total) by mouth daily. 30 capsule 0  . traZODone (DESYREL) 100 MG tablet Take 100 mg by mouth at bedtime.    . Oxycodone HCl 10 MG TABS Take 1 tablet (10 mg total) by mouth every 6 (six) hours as needed. (Patient not taking: Reported on 04/28/2016) 30 tablet 0   No facility-administered medications prior to visit.    Review of Systems   Patient denies headache, fevers, malaise, unintentional weight loss, skin rash, eye pain, sinus congestion and sinus pain, sore throat, dysphagia,  hemoptysis , cough, dyspnea, wheezing, chest pain, palpitations, orthopnea, edema, abdominal pain, nausea, melena, diarrhea, constipation, flank pain, dysuria, hematuria, urinary  Frequency, nocturia, numbness, tingling, seizures,  Focal weakness, Loss of consciousness,  Tremor, insomnia, depression, anxiety, and  suicidal ideation.      Objective:  BP 138/88 mmHg  Pulse 89  Temp(Src) 98.6 F (37 C) (Oral)  Resp 10  Ht 5' 10.5" (1.791 m)  Wt 238 lb 8 oz (108.183 kg)  BMI 33.73 kg/m2  SpO2 97%  Physical Exam  General appearance: alert, cooperative and appears stated age Ears: normal TM's and external ear canals both ears Throat: lips, mucosa, and tongue normal; teeth and gums normal Neck: no adenopathy, no carotid bruit, supple, symmetrical, trachea midline and thyroid not enlarged, symmetric, no tenderness/mass/nodules Back: symmetric, no curvature. ROM normal. No CVA tenderness. Lungs: clear to auscultation bilaterally Heart: regular rate and rhythm, S1, S2 normal, no murmur, click, rub or gallop Abdomen: soft, non-tender; bowel sounds normal; no masses,  no organomegaly Pulses: 2+ and symmetric Skin: Skin color, texture, turgor normal. No rashes or lesions Lymph nodes: Cervical, supraclavicular, and axillary nodes normal.  Assessment & Plan:   Problem List Items Addressed This Visit    Hypertriglyceridemia    Now managed with Crestor, Lopid and fish oil.  Lab Results  Component Value Date   CHOL 899* 03/10/2016   HDL 16* 03/10/2016  [place] UNABLE TO CALCULATE IF TRIGLYCERIDE OVER 400 mg/dL 03/10/2016   LDLDIRECT 111.4 06/30/2012   TRIG 889* 03/19/2016   CHOLHDL 56.2 03/10/2016          HTN (hypertension)    he has no history of hypertension but has had several elevated readings.  She has been asked to check her pressures at home and submit readings for evaluation. Renal function will be checked today.  . Lab Results  Component Value Date   CREATININE 0.82 03/19/2016   Lab Results  Component Value Date   NA 137 03/19/2016   K 3.8 03/19/2016   CL 103 03/19/2016   CO2 28 03/19/2016         Generalized anxiety disorder    Adding low  Dose clonazepam for late afternoon irritability .  Continue zoloft 150 mg daily alprazolam dc'd       Diarrhea    Given his  recurrrent pancreatitis,  He is at risk for loss of exocrine function leading to chronic diarrhea. Current symptoms have been present for  < 48 hours and appear to be viral.  immodium advised,  BRAT diet.       Encounter for preventive health examination - Primary    Annual comprehensive preventive exam was done as well as an evaluation and management of acute and chronic conditions .  During the course of the visit the patient was educated and counseled about appropriate screening and preventive services including :  diabetes screening, lipid analysis with projected  10 year  risk for CAD , nutrition counseling, prostate and colorectal cancer screening, and recommended immunizations.  Printed recommendations for health maintenance screenings was given.          I have discontinued Mr. Berteau Oxycodone HCl. I am also having him start on clonazePAM. Additionally, I am having him maintain his gemfibrozil, rosuvastatin, ALPRAZolam, pregabalin, traZODone, insulin aspart, omega-3 acid ethyl esters, finasteride, tamsulosin, and sertraline.  Meds ordered this encounter  Medications  . clonazePAM (KLONOPIN) 0.5 MG tablet    Sig: Take 1 tablet (0.5 mg total) by mouth daily. In the afternoon for anxiety    Dispense:  30 tablet    Refill:  1    Medications Discontinued During This Encounter  Medication Reason  . Oxycodone HCl 10 MG TABS     Follow-up: No Follow-up on file.   Crecencio Mc, MD

## 2016-05-01 NOTE — Assessment & Plan Note (Signed)

## 2016-05-01 NOTE — Assessment & Plan Note (Signed)
Adding low  Dose clonazepam for late afternoon irritability .  Continue zoloft 150 mg daily alprazolam dc'd

## 2016-05-01 NOTE — Assessment & Plan Note (Signed)
Given his recurrrent pancreatitis,  He is at risk for loss of exocrine function leading to chronic diarrhea. Current symptoms have been present for  < 48 hours and appear to be viral.  immodium advised,  BRAT diet.

## 2016-07-24 ENCOUNTER — Encounter: Payer: Self-pay | Admitting: Emergency Medicine

## 2016-07-24 ENCOUNTER — Inpatient Hospital Stay
Admission: EM | Admit: 2016-07-24 | Discharge: 2016-08-04 | DRG: 642 | Disposition: A | Discharge status: Home / Self Care | Payer: BLUE CROSS/BLUE SHIELD | Attending: Internal Medicine | Admitting: Internal Medicine

## 2016-07-24 DIAGNOSIS — F419 Anxiety disorder, unspecified: Secondary | ICD-10-CM | POA: Diagnosis present

## 2016-07-24 DIAGNOSIS — K859 Acute pancreatitis without necrosis or infection, unspecified: Secondary | ICD-10-CM | POA: Diagnosis present

## 2016-07-24 DIAGNOSIS — Z9884 Bariatric surgery status: Secondary | ICD-10-CM

## 2016-07-24 DIAGNOSIS — K861 Other chronic pancreatitis: Secondary | ICD-10-CM | POA: Diagnosis present

## 2016-07-24 DIAGNOSIS — K858 Other acute pancreatitis without necrosis or infection: Secondary | ICD-10-CM

## 2016-07-24 DIAGNOSIS — E781 Pure hyperglyceridemia: Secondary | ICD-10-CM | POA: Diagnosis not present

## 2016-07-24 DIAGNOSIS — Z8249 Family history of ischemic heart disease and other diseases of the circulatory system: Secondary | ICD-10-CM

## 2016-07-24 DIAGNOSIS — I10 Essential (primary) hypertension: Secondary | ICD-10-CM | POA: Diagnosis present

## 2016-07-24 DIAGNOSIS — Z9989 Dependence on other enabling machines and devices: Secondary | ICD-10-CM

## 2016-07-24 DIAGNOSIS — Z6834 Body mass index (BMI) 34.0-34.9, adult: Secondary | ICD-10-CM

## 2016-07-24 DIAGNOSIS — I639 Cerebral infarction, unspecified: Secondary | ICD-10-CM

## 2016-07-24 DIAGNOSIS — H532 Diplopia: Secondary | ICD-10-CM | POA: Diagnosis present

## 2016-07-24 DIAGNOSIS — N401 Enlarged prostate with lower urinary tract symptoms: Secondary | ICD-10-CM | POA: Diagnosis present

## 2016-07-24 DIAGNOSIS — Z885 Allergy status to narcotic agent status: Secondary | ICD-10-CM

## 2016-07-24 DIAGNOSIS — H4920 Sixth [abducent] nerve palsy, unspecified eye: Secondary | ICD-10-CM | POA: Diagnosis present

## 2016-07-24 DIAGNOSIS — E1165 Type 2 diabetes mellitus with hyperglycemia: Secondary | ICD-10-CM | POA: Diagnosis present

## 2016-07-24 DIAGNOSIS — K8689 Other specified diseases of pancreas: Secondary | ICD-10-CM

## 2016-07-24 DIAGNOSIS — F329 Major depressive disorder, single episode, unspecified: Secondary | ICD-10-CM | POA: Diagnosis present

## 2016-07-24 DIAGNOSIS — Z8711 Personal history of peptic ulcer disease: Secondary | ICD-10-CM

## 2016-07-24 DIAGNOSIS — G4733 Obstructive sleep apnea (adult) (pediatric): Secondary | ICD-10-CM | POA: Diagnosis present

## 2016-07-24 DIAGNOSIS — Z9641 Presence of insulin pump (external) (internal): Secondary | ICD-10-CM | POA: Diagnosis present

## 2016-07-24 DIAGNOSIS — R338 Other retention of urine: Secondary | ICD-10-CM | POA: Diagnosis present

## 2016-07-24 DIAGNOSIS — Z79899 Other long term (current) drug therapy: Secondary | ICD-10-CM

## 2016-07-24 DIAGNOSIS — Z91041 Radiographic dye allergy status: Secondary | ICD-10-CM

## 2016-07-24 DIAGNOSIS — E876 Hypokalemia: Secondary | ICD-10-CM | POA: Diagnosis present

## 2016-07-24 DIAGNOSIS — E871 Hypo-osmolality and hyponatremia: Secondary | ICD-10-CM

## 2016-07-24 DIAGNOSIS — Z794 Long term (current) use of insulin: Secondary | ICD-10-CM

## 2016-07-24 DIAGNOSIS — H4922 Sixth [abducent] nerve palsy, left eye: Secondary | ICD-10-CM

## 2016-07-24 LAB — CBC
HEMATOCRIT: 40.7 % (ref 40.0–52.0)
Hemoglobin: UNDETERMINED g/dL (ref 13.0–18.0)
MCH: UNDETERMINED pg (ref 26.0–34.0)
MCHC: UNDETERMINED g/dL (ref 32.0–36.0)
MCV: 76.7 fL — ABNORMAL LOW (ref 80.0–100.0)
Platelets: 273 10*3/uL (ref 150–440)
RBC: 5.3 MIL/uL (ref 4.40–5.90)
RDW: 16.7 % — AB (ref 11.5–14.5)
WBC: 5.3 10*3/uL (ref 3.8–10.6)

## 2016-07-24 LAB — TRIGLYCERIDES
TRIGLYCERIDES: UNDETERMINED mg/dL (ref <150)
Triglycerides: UNDETERMINED mg/dL (ref <150)

## 2016-07-24 LAB — URINALYSIS COMPLETE WITH MICROSCOPIC (ARMC ONLY)
BILIRUBIN URINE: NEGATIVE
Bacteria, UA: NONE SEEN
Glucose, UA: 50 mg/dL — AB
Hgb urine dipstick: NEGATIVE
Leukocytes, UA: NEGATIVE
Nitrite: NEGATIVE
PH: 5 (ref 5.0–8.0)
Protein, ur: 100 mg/dL — AB
Specific Gravity, Urine: 1.019 (ref 1.005–1.030)
Squamous Epithelial / LPF: NONE SEEN

## 2016-07-24 LAB — COMPREHENSIVE METABOLIC PANEL
ALBUMIN: 4.2 g/dL (ref 3.5–5.0)
ALK PHOS: 71 U/L (ref 38–126)
ALK PHOS: 46 U/L (ref 38–126)
ALT: 22 U/L (ref 17–63)
ALT: 19 U/L (ref 17–63)
ANION GAP: 6 (ref 5–15)
AST: 21 U/L (ref 15–41)
AST: 19 U/L (ref 15–41)
Albumin: 4.9 g/dL (ref 3.5–5.0)
Anion gap: 8 (ref 5–15)
BUN: 9 mg/dL (ref 6–20)
BUN: 9 mg/dL (ref 6–20)
CALCIUM: 8.7 mg/dL — AB (ref 8.9–10.3)
CALCIUM: 7.9 mg/dL — AB (ref 8.9–10.3)
CHLORIDE: 97 mmol/L — AB (ref 101–111)
CO2: 23 mmol/L (ref 22–32)
CO2: 23 mmol/L (ref 22–32)
CREATININE: 0.78 mg/dL (ref 0.61–1.24)
Chloride: 91 mmol/L — ABNORMAL LOW (ref 101–111)
Creatinine, Ser: 0.41 mg/dL — ABNORMAL LOW (ref 0.61–1.24)
GFR calc non Af Amer: >60 mL/min (ref >60)
GLUCOSE: 148 mg/dL — AB (ref 65–99)
Glucose, Bld: 184 mg/dL — ABNORMAL HIGH (ref 65–99)
POTASSIUM: 3.3 mmol/L — AB (ref 3.5–5.1)
Potassium: 3.1 mmol/L — ABNORMAL LOW (ref 3.5–5.1)
SODIUM: 126 mmol/L — AB (ref 135–145)
Sodium: 122 mmol/L — ABNORMAL LOW (ref 135–145)
Total Bilirubin: <0.1 mg/dL — ABNORMAL LOW (ref 0.3–1.2)
Total Protein: 5.1 g/dL — ABNORMAL LOW (ref 6.5–8.1)
Total Protein: 4.5 g/dL — ABNORMAL LOW (ref 6.5–8.1)

## 2016-07-24 LAB — GLUCOSE, CAPILLARY
GLUCOSE-CAPILLARY: 144 mg/dL — AB (ref 65–99)
Glucose-Capillary: 166 mg/dL — ABNORMAL HIGH (ref 65–99)
Glucose-Capillary: 182 mg/dL — ABNORMAL HIGH (ref 65–99)
Glucose-Capillary: 217 mg/dL — ABNORMAL HIGH (ref 65–99)

## 2016-07-24 LAB — MAGNESIUM: MAGNESIUM: 5.1 mg/dL — AB (ref 1.7–2.4)

## 2016-07-24 LAB — LIPASE, BLOOD: LIPASE: 17 U/L (ref 11–51)

## 2016-07-24 LAB — MRSA PCR SCREENING: MRSA by PCR: NEGATIVE

## 2016-07-24 MED ORDER — DOCUSATE SODIUM 100 MG PO CAPS
100.0000 mg | ORAL_CAPSULE | Freq: Two times a day (BID) | ORAL | Status: DC
  Administered 2016-08-03: 100 mg via ORAL
  Filled 2016-07-24 (×10): qty 1

## 2016-07-24 MED ORDER — SODIUM CHLORIDE 0.9 % IV SOLN: INTRAVENOUS | Status: DC

## 2016-07-24 MED ORDER — GEMFIBROZIL 600 MG PO TABS
600.0000 mg | ORAL_TABLET | Freq: Two times a day (BID) | ORAL | Status: DC
  Administered 2016-07-25 – 2016-08-04 (×21): 600 mg via ORAL
  Filled 2016-07-24 (×21): qty 1

## 2016-07-24 MED ORDER — SODIUM CHLORIDE 0.9 % IV SOLN
INTRAVENOUS | Status: DC
  Administered 2016-07-24: 1.1 [IU]/h via INTRAVENOUS
  Filled 2016-07-24: qty 2.5

## 2016-07-24 MED ORDER — TRAZODONE HCL 100 MG PO TABS
100.0000 mg | ORAL_TABLET | Freq: Every day | ORAL | Status: DC
  Administered 2016-07-26 – 2016-08-03 (×9): 100 mg via ORAL
  Filled 2016-07-24 (×3): qty 2
  Filled 2016-07-24: qty 1
  Filled 2016-07-24 (×4): qty 2
  Filled 2016-07-24: qty 1

## 2016-07-24 MED ORDER — SODIUM CHLORIDE 0.9% FLUSH
3.0000 mL | Freq: Two times a day (BID) | INTRAVENOUS | Status: DC
  Administered 2016-07-25 – 2016-08-04 (×17): 3 mL via INTRAVENOUS

## 2016-07-24 MED ORDER — HYDROMORPHONE HCL 1 MG/ML IJ SOLN
INTRAMUSCULAR | Status: AC
  Administered 2016-07-24: 1 mg via INTRAVENOUS
  Filled 2016-07-24: qty 1

## 2016-07-24 MED ORDER — HYDROMORPHONE HCL 1 MG/ML IJ SOLN
1.0000 mg | INTRAMUSCULAR | Status: AC | PRN
  Administered 2016-07-24 (×2): 1 mg via INTRAVENOUS
  Filled 2016-07-24 (×2): qty 1

## 2016-07-24 MED ORDER — ONDANSETRON HCL 4 MG/2ML IJ SOLN
4.0000 mg | Freq: Once | INTRAMUSCULAR | Status: AC
  Administered 2016-07-24: 4 mg via INTRAVENOUS

## 2016-07-24 MED ORDER — HYDROMORPHONE HCL 1 MG/ML IJ SOLN
1.0000 mg | Freq: Once | INTRAMUSCULAR | Status: AC
  Administered 2016-07-24: 1 mg via INTRAVENOUS

## 2016-07-24 MED ORDER — INSULIN REGULAR BOLUS VIA INFUSION
0.0000 [IU] | Freq: Three times a day (TID) | INTRAVENOUS | Status: DC
  Filled 2016-07-24: qty 10

## 2016-07-24 MED ORDER — ONDANSETRON HCL 4 MG PO TABS: 4.0000 mg | ORAL_TABLET | Freq: Four times a day (QID) | ORAL | Status: DC | PRN

## 2016-07-24 MED ORDER — HYDROMORPHONE HCL 2 MG PO TABS: 2.0000 mg | ORAL_TABLET | Freq: Four times a day (QID) | ORAL | 0 refills | Status: DC | PRN

## 2016-07-24 MED ORDER — TAMSULOSIN HCL 0.4 MG PO CAPS
0.8000 mg | ORAL_CAPSULE | Freq: Every day | ORAL | Status: DC
  Administered 2016-07-24 – 2016-08-04 (×12): 0.8 mg via ORAL
  Filled 2016-07-24 (×12): qty 2

## 2016-07-24 MED ORDER — CLONAZEPAM 0.5 MG PO TABS
0.5000 mg | ORAL_TABLET | Freq: Every day | ORAL | Status: DC | PRN
  Administered 2016-07-26 – 2016-08-02 (×3): 0.5 mg via ORAL
  Filled 2016-07-24 (×3): qty 1

## 2016-07-24 MED ORDER — SODIUM CHLORIDE 0.9 % IV BOLUS (SEPSIS)
1000.0000 mL | Freq: Once | INTRAVENOUS | Status: AC
  Administered 2016-07-24: 1000 mL via INTRAVENOUS

## 2016-07-24 MED ORDER — OMEGA-3-ACID ETHYL ESTERS 1 G PO CAPS
4.0000 g | ORAL_CAPSULE | Freq: Every day | ORAL | Status: DC
  Administered 2016-07-24 – 2016-08-03 (×11): 4 g via ORAL
  Filled 2016-07-24 (×2): qty 4
  Filled 2016-07-24: qty 1
  Filled 2016-07-24 (×9): qty 4

## 2016-07-24 MED ORDER — PREGABALIN 75 MG PO CAPS
300.0000 mg | ORAL_CAPSULE | Freq: Every day | ORAL | Status: DC
  Administered 2016-07-24 – 2016-08-03 (×11): 300 mg via ORAL
  Filled 2016-07-24 (×11): qty 4

## 2016-07-24 MED ORDER — SERTRALINE HCL 100 MG PO TABS
100.0000 mg | ORAL_TABLET | Freq: Every day | ORAL | Status: DC
  Administered 2016-07-25 – 2016-08-04 (×11): 100 mg via ORAL
  Filled 2016-07-24 (×11): qty 1

## 2016-07-24 MED ORDER — ONDANSETRON HCL 4 MG/2ML IJ SOLN
4.0000 mg | Freq: Four times a day (QID) | INTRAMUSCULAR | Status: DC | PRN
  Administered 2016-07-25: 4 mg via INTRAVENOUS
  Filled 2016-07-24 (×2): qty 2

## 2016-07-24 MED ORDER — ONDANSETRON HCL 4 MG/2ML IJ SOLN
INTRAMUSCULAR | Status: AC
  Administered 2016-07-24: 4 mg via INTRAVENOUS
  Filled 2016-07-24: qty 2

## 2016-07-24 MED ORDER — HYDROMORPHONE HCL 1 MG/ML IJ SOLN
1.0000 mg | INTRAMUSCULAR | Status: DC | PRN
  Administered 2016-07-24 – 2016-07-31 (×15): 1 mg via INTRAVENOUS
  Filled 2016-07-24 (×16): qty 1

## 2016-07-24 MED ORDER — KCL IN DEXTROSE-NACL 20-5-0.45 MEQ/L-%-% IV SOLN
INTRAVENOUS | Status: DC
  Administered 2016-07-24 – 2016-07-26 (×3): via INTRAVENOUS
  Filled 2016-07-24 (×6): qty 1000

## 2016-07-24 MED ORDER — DEXTROSE-NACL 5-0.45 % IV SOLN
INTRAVENOUS | Status: DC
  Administered 2016-07-24 (×2): via INTRAVENOUS

## 2016-07-24 MED ORDER — ONDANSETRON HCL 4 MG/2ML IJ SOLN
4.0000 mg | Freq: Once | INTRAMUSCULAR | Status: AC
  Administered 2016-07-24: 4 mg via INTRAVENOUS
  Filled 2016-07-24: qty 2

## 2016-07-24 MED ORDER — HYDROMORPHONE HCL 1 MG/ML IJ SOLN
INTRAMUSCULAR | Status: AC
  Filled 2016-07-24: qty 1

## 2016-07-24 MED ORDER — SODIUM CHLORIDE 0.9 % IV SOLN
INTRAVENOUS | Status: DC
  Administered 2016-07-24 – 2016-07-31 (×9): via INTRAVENOUS
  Filled 2016-07-24 (×11): qty 250

## 2016-07-24 MED ORDER — ENOXAPARIN SODIUM 40 MG/0.4ML ~~LOC~~ SOLN
40.0000 mg | SUBCUTANEOUS | Status: DC
  Administered 2016-07-24 – 2016-07-25 (×2): 40 mg via SUBCUTANEOUS
  Filled 2016-07-24 (×2): qty 0.4

## 2016-07-24 MED ORDER — SODIUM CHLORIDE 0.9 % IV SOLN
250.0000 [IU] | INTRAVENOUS | Status: DC
  Filled 2016-07-24: qty 250

## 2016-07-24 MED ORDER — HYDROMORPHONE HCL 1 MG/ML IJ SOLN
2.0000 mg | INTRAMUSCULAR | Status: DC | PRN
  Administered 2016-07-24 – 2016-07-30 (×27): 2 mg via INTRAVENOUS
  Filled 2016-07-24 (×22): qty 2
  Filled 2016-07-24: qty 1
  Filled 2016-07-24 (×5): qty 2

## 2016-07-24 MED ORDER — FINASTERIDE 5 MG PO TABS
10.0000 mg | ORAL_TABLET | Freq: Every day | ORAL | Status: DC
  Administered 2016-07-24 – 2016-07-27 (×4): 10 mg via ORAL
  Filled 2016-07-24 (×4): qty 2

## 2016-07-24 MED ORDER — DEXTROSE 50 % IV SOLN
25.0000 mL | INTRAVENOUS | Status: DC | PRN
  Administered 2016-07-27 – 2016-07-30 (×4): 25 mL via INTRAVENOUS
  Filled 2016-07-24 (×5): qty 50

## 2016-07-24 MED ORDER — ONDANSETRON HCL 4 MG PO TABS: 4.0000 mg | ORAL_TABLET | Freq: Three times a day (TID) | ORAL | 1 refills | Status: DC | PRN

## 2016-07-24 NOTE — ED Notes (Signed)
Pt updated on plan of care, pt resting comfortably in bed at this time

## 2016-07-24 NOTE — H&P (Addendum)
[facility] NAME: Sergio [name]    MR#:  [id number]  DATE OF BIRTH:  1976-03-28  DATE OF ADMISSION:  07/24/2016  PRIMARY CARE PHYSICIAN: Crecencio Mc, MD   REQUESTING/REFERRING PHYSICIAN:   CHIEF COMPLAINT:   Chief Complaint  Patient presents with  . Abdominal Pain    HISTORY OF PRESENT ILLNESS: Sergio [name]  is a 40 y.o. male with a known history of Familiar Hypertriglyceridemia, chronic pancreatitis, diabetes mellitus, on insulin pump, morbid obesity, status post bariatric surgery, gastric ulcer, hypertension, obstructive sleep apnea, who presents to the hospital with complaints of severe epigastric abdominal pain, radiating to the back. Patient noted xanthomas errupting on his skin about 2 weeks ago,  however, few days ago he started having upper abdominal pain with radiation to the back, accompanied by nausea but not vomiting. He presented to the emergency room for further evaluation and treatment. In emergency room a few attempts to check his triglyceride levels were made, finally revealing triglyceride level more than 5000 . Hospitalist services were contacted for admission .   PAST MEDICAL HISTORY:   Past Medical History:  Diagnosis Date  . Anxiety   . Chicken pox   . Depression   . Diabetes mellitus   . Familial hypertriglyceridemia    severe  . Gastric ulcer 2009  . HTN (hypertension)   . Morbid obesity ([place])    s/p bariatric sleeve surgery 01/2015  . Recurrent acute pancreatitis    secondary to hypertriglyceridemia   . Sleep apnea 1999   uses CPAP    PAST SURGICAL HISTORY: Past Surgical History:  Procedure Laterality Date  . CHOLECYSTECTOMY    . LAPAROSCOPIC GASTRIC SLEEVE RESECTION    . [place] SURGERY  2008  . TONSILLECTOMY  2003  . VASECTOMY      SOCIAL HISTORY:  Social History  Substance Use Topics  . Smoking status: Never Smoker  . Smokeless tobacco: Never Used  . Alcohol use No   Comment: once month    FAMILY HISTORY:  Family History  Problem Relation Age of Onset  . Adopted: Yes  . Hypertension Mother   . Hyperlipidemia Mother   . Hypertension Father   . Hyperlipidemia Father     DRUG ALLERGIES:  Allergies  Allergen Reactions  . Codeine Anaphylaxis  . Ivp Dye [Iodinated Diagnostic Agents] Other (See Comments)    Kidneys stop working    Review of Systems  Constitutional: Negative for chills, fever and weight loss.  HENT: Negative for congestion.   Eyes: Negative for blurred vision and double vision.  Respiratory: Negative for cough, sputum production, shortness of breath and wheezing.   Cardiovascular: Negative for chest pain, palpitations, orthopnea, leg swelling and PND.  Gastrointestinal: Positive for abdominal pain and nausea. Negative for blood in stool, constipation, diarrhea and vomiting.  Genitourinary: Negative for dysuria, frequency, hematuria and urgency.  Musculoskeletal: Negative for falls.  Skin: Positive for rash.  Neurological: Negative for dizziness, tremors, focal weakness and headaches.  Endo/Heme/Allergies: Does not bruise/bleed easily.  Psychiatric/Behavioral: Negative for depression. The patient does not have insomnia.     MEDICATIONS AT HOME:  Prior to Admission medications   Medication Sig Start Date End Date Taking? Authorizing Provider  clonazePAM (KLONOPIN) 0.5 MG tablet Take 1 tablet (0.5 mg total) by mouth daily. In the afternoon for anxiety 04/28/16  Yes Crecencio Mc, MD  finasteride (PROSCAR) 5 MG tablet Take 1 tablet (5 mg total) by mouth  daily. 01/27/16  Yes Vaughan Basta, MD  gemfibrozil (LOPID) 600 MG tablet Take 1 tablet (600 mg total) by mouth 2 (two) times daily before a meal. 08/15/15  Yes Loletha Grayer, MD  insulin aspart (NOVOLOG) 100 UNIT/ML injection Inject 1 Dose into the skin See admin instructions. Use up to 120 units daily via insulin pump Humalog 09/11/15  Yes Historical Provider, MD  omega-3  acid ethyl esters (LOVAZA) 1 g capsule Take 4 g by mouth at bedtime.   Yes Historical Provider, MD  pregabalin (LYRICA) 300 MG capsule Take 300 mg by mouth at bedtime.    Yes Historical Provider, MD  sertraline (ZOLOFT) 100 MG tablet Take 1.5 tablets (150 mg total) by mouth daily. Patient taking differently: Take 100 mg by mouth daily.  03/19/16  Yes Lytle Butte, MD  tamsulosin (FLOMAX) 0.4 MG CAPS capsule Take 1 capsule (0.4 mg total) by mouth daily. 01/27/16  Yes Vaughan Basta, MD  traZODone (DESYREL) 100 MG tablet Take 100 mg by mouth at bedtime.   Yes Historical Provider, MD  HYDROmorphone (DILAUDID) 2 MG tablet Take 1 tablet (2 mg total) by mouth every 6 (six) hours as needed for severe pain. 07/24/16 07/24/17  Anne-Caroline Mariea Clonts, MD  ondansetron (ZOFRAN) 4 MG tablet Take 1 tablet (4 mg total) by mouth every 8 (eight) hours as needed for nausea or vomiting. 07/24/16 07/24/17  Eula Listen, MD      PHYSICAL EXAMINATION:   VITAL SIGNS: Blood pressure (!) 145/95, pulse 67, temperature 98.2 F (36.8 C), resp. rate 16, height 5\' 11"  (1.803 m), weight 113.4 kg (250 lb), SpO2 96 %.  GENERAL:  40 y.o.-year-old patient lying in the bed with In mild to moderate distress due to upper abdominal pain.  EYES: Pupils equal, round, reactive to light and accommodation. No scleral icterus. Extraocular muscles intact.  HEENT: Head atraumatic, normocephalic. Oropharynx and nasopharynx clear.  NECK:  Supple, no jugular venous distention. No thyroid enlargement, no tenderness.  LUNGS: Normal breath sounds bilaterally, no wheezing, rales,rhonchi or crepitation. No use of accessory muscles of respiration.  CARDIOVASCULAR: S1, S2 normal. No murmurs, rubs, or gallops.  ABDOMEN: Soft, tender mostly in the epigastric area, some voluntary guarding but no rebound , nondistended. Bowel sounds present. No organomegaly or mass.  EXTREMITIES: No pedal edema, cyanosis, or clubbing.  NEUROLOGIC: Cranial  nerves II through XII are intact. Muscle strength 5/5 in all extremities. Sensation intact. Gait not checked.  PSYCHIATRIC: The patient is alert and oriented x 3.  SKIN: No obvious rash, lesion, or ulcer.xanthomas   in upper extremities bilaterally   LABORATORY PANEL:   CBC  Recent Labs Lab 07/24/16 1123  WBC 5.3  HGB UNABLE TO REPORT DUE TO LIPEMIC INTERFERENCE  HCT 40.7  PLT 273  MCV 76.7*  MCH UNABLE TO REPORT DUE TO LIPEMIC INTERFERENCE  MCHC UNABLE TO REPORT DUE TO LIPEMIC INTERFERENCE  RDW 16.7*   ------------------------------------------------------------------------------------------------------------------  Chemistries   Recent Labs Lab 07/24/16 1333  NA 126*  K 3.3*  CL 97*  CO2 23  GLUCOSE 148*  BUN 9  CREATININE 0.41*  CALCIUM 7.9*  AST 19  ALT 19  ALKPHOS 25  BILITOT <0.1*   ------------------------------------------------------------------------------------------------------------------  Cardiac Enzymes No results for input(s): TROPONINI in the last 168 hours. ------------------------------------------------------------------------------------------------------------------  RADIOLOGY: No results found.  EKG: Orders placed or performed during the hospital encounter of 07/24/16  . ED EKG  . ED EKG  EKG in the emergency room reveals sinus rhythm at 90 bpm,  normal axis, no acute ST-T changes  IMPRESSION AND PLAN:  Active Problems:   Hypertriglyceridemia   Chronic pancreatitis (HCC)   Hyponatremia   Hypokalemia  #1, hypertriglyceridemia, limitation medical floor for insulin IV drip, follow triglycerides daily, continue Lopid and omega-3 fatty acids, get endocrinologist for further recommendations   #2 chronic pancreatitis exacerbation, supportive therapy with IV fluids, pain medications #3. Hyponatremia, IV fluids with normal saline, follow sodium level in the morning #4. Hypokalemia, supplement intravenously, check magnesium level,  supplemented as needed #5. BPH, advance patient's finasteride and Flomax, per outpatient urologist recommendations #6. Diabetes mellitus, continue insulin IV drip for now, endocrinologist consultation is requested, will likely be seeing patient on Monday  All the records are reviewed and case discussed with ED provider. Management plans discussed with the patient, family and they are in agreement.  CODE STATUS: Code Status History    Date Active Date Inactive Code Status Order ID Comments User Context   03/11/2016  4:58 AM 03/19/2016  4:44 PM Full Code [id number]  Harrie Foreman, MD Inpatient   01/17/2016  2:50 AM 01/27/2016  5:47 PM Full Code [id number]  Harrie Foreman, MD Inpatient   08/01/2015 11:35 PM 08/15/2015  4:43 PM Full Code [id number]  Lytle Butte, MD ED   07/21/2014 11:46 AM 07/27/2014  6:16 PM Full Code [id number]  Oswald Hillock, MD Inpatient       TOTAL TIME TAKING CARE OF THIS PATIENT: 50 minutes.    Theodoro Grist M.D on 07/24/2016 at 8:56 PM  Between 7am to 6pm - Pager - [phone] After 6pm go to [email or web address] - password [email or web address] [facility]  Office  [phone]  CC: Primary care physician; Crecencio Mc, MD

## 2016-07-24 NOTE — ED Triage Notes (Signed)
Abdominal pain since yesterday, history of pancreatitis.

## 2016-07-24 NOTE — ED Provider Notes (Signed)
[facility] Emergency Department Provider Note  ____________________________________________  Time seen: Approximately 12:24 PM  I have reviewed the triage vital signs and the nursing notes.   HISTORY  Chief Complaint Abdominal Pain    HPI Sergio [name] is a 40 y.o. male with a history of recurrent pancreatitis due to familial hypertriglyceridemia presenting with epigastric abdominal pain radiating to the back x 2 days. The patient reports that this is similar to previous pancreatic otitis episodes. He denies any nausea or vomiting. He has tried increasing his insulin pump dosage, without improvement. He has not taken any pain medications or antiemetics at home. He denies any fever, diarrhea or constipation. He has no chest pain or shortness of breath.His previous admission from 6/1-03/19/2016 required ICU admission for insulin drip to improve his hypertriglyceridemia, which in result improved his pancreatitis.   Past Medical History:  Diagnosis Date  . Anxiety   . Chicken pox   . Depression   . Diabetes mellitus   . Familial hypertriglyceridemia    severe  . Gastric ulcer 2009  . HTN (hypertension)   . Morbid obesity ([place])    s/p bariatric sleeve surgery 01/2015  . Recurrent acute pancreatitis    secondary to hypertriglyceridemia   . Sleep apnea 1999   uses CPAP    Patient Active Problem List   Diagnosis Date Noted  . Encounter for preventive health examination 05/01/2016  . Depression, major, recurrent, moderate ([place]) 03/14/2016  . S/P bariatric surgery 02/01/2015  . S/P laparoscopic cholecystectomy 02/01/2015  . Diarrhea 05/29/2012  . Recurrent pancreatitis ([place]) 05/29/2012  . Generalized anxiety disorder 04/29/2012  . Obesity (BMI 30-39.9) 04/29/2012  . DM (diabetes mellitus), type 2, uncontrolled w/neurologic complication ([place])   . Hypertriglyceridemia   . HTN (hypertension)   . Sleep apnea     Past Surgical History:  Procedure  Laterality Date  . CHOLECYSTECTOMY    . LAPAROSCOPIC GASTRIC SLEEVE RESECTION    . [place] SURGERY  2008  . TONSILLECTOMY  2003  . VASECTOMY      Current Outpatient Rx  . Order #: [id number] Class: Print  . Order #: [id number] Class: Print  . Order #: [id number] Class: Print  . Order #: [id number] Class: Historical Med  . Order #: [id number] Class: Historical Med  . Order #: [id number] Class: Historical Med  . Order #: [id number] Class: Print  . Order #: [id number] Class: Print  . Order #: [id number] Class: Historical Med  . Order #: [id number] Class: Print  . Order #: [id number] Class: Print    Allergies Codeine and Ivp dye [iodinated diagnostic agents]  Family History  Problem Relation Age of Onset  . Adopted: Yes  . Hypertension Mother   . Hyperlipidemia Mother   . Hypertension Father   . Hyperlipidemia Father     Social History Social History  Substance Use Topics  . Smoking status: Never Smoker  . Smokeless tobacco: Never Used  . Alcohol use No     Comment: once month    Review of Systems Constitutional: No fever/chills.No lightheadedness or syncope. Eyes: No visual changes. ENT: No sore throat. No congestion or rhinorrhea. Cardiovascular: Denies chest pain. Denies palpitations. Respiratory: Denies shortness of breath.  No cough. Gastrointestinal: Positive epigastric abdominal pain.  Positive nausea, positive vomiting.  No diarrhea.  No constipation. Genitourinary: Negative for dysuria. Musculoskeletal: Positive for back pain from radiation of epigastric pain. Skin: Negative for rash. Neurological: Negative for headaches. No focal numbness, tingling or weakness.   10-point ROS otherwise negative.  ____________________________________________   PHYSICAL  EXAM:  VITAL SIGNS: ED Triage Vitals  Enc Vitals Group     BP 07/24/16 1052 (!) 142/101     Pulse Rate 07/24/16 1052 95     Resp 07/24/16 1052 18     Temp 07/24/16 1052 98.2 F (36.8 C)     Temp src --       SpO2 07/24/16 1052 97 %     Weight 07/24/16 1053 250 lb (113.4 kg)     Height 07/24/16 1053 5\' 11"  (1.803 m)     Head Circumference --      Peak Flow --      Pain Score 07/24/16 1054 9     Pain Loc --      Pain Edu? --      Excl. in [place]? --     Constitutional: Alert and oriented. Well appearing and in no acute distress. Answers questions appropriately. Eyes: Conjunctivae are normal.  EOMI. No scleral icterus. Head: Atraumatic. Nose: No congestion/rhinnorhea. Mouth/Throat: Mucous membranes are moist.  Neck: No stridor.  Supple.   Cardiovascular: Normal rate, regular rhythm. No murmurs, rubs or gallops.  Respiratory: Normal respiratory effort.  No accessory muscle use or retractions. Lungs CTAB.  No wheezes, rales or ronchi. Gastrointestinal: Obese. Soft, and nondistended. Tender to palpation in the epigastrium. No right upper quadrant pain or Murphy sign. No guarding or rebound.  No peritoneal signs. Musculoskeletal: No LE edema.  Neurologic:  A&Ox3.  Speech is clear.  Face and smile are symmetric.  EOMI.  Moves all extremities well. Skin:  Skin is warm, dry and intact. No rash noted. Psychiatric: Mood and affect are normal. Speech and behavior are normal.  Normal judgement.  ____________________________________________   LABS (all labs ordered are listed, but only abnormal results are displayed)  Labs Reviewed  COMPREHENSIVE METABOLIC PANEL - Abnormal; Notable for the following:       Result Value   Sodium 122 (*)    Potassium 3.1 (*)    Chloride 91 (*)    Glucose, Bld 184 (*)    Calcium 8.7 (*)    Total Protein 5.1 (*)    Total Bilirubin <0.1 (*)    All other components within normal limits  URINALYSIS COMPLETEWITH MICROSCOPIC ([facility] ONLY) - Abnormal; Notable for the following:    Color, Urine YELLOW (*)    APPearance CLEAR (*)    Glucose, UA 50 (*)    Ketones, ur TRACE (*)    Protein, ur 100 (*)    All other components within normal limits  CBC - Abnormal; Notable for  the following:    MCV 76.7 (*)    RDW 16.7 (*)    All other components within normal limits  COMPREHENSIVE METABOLIC PANEL - Abnormal; Notable for the following:    Sodium 126 (*)    Potassium 3.3 (*)    Chloride 97 (*)    Glucose, Bld 148 (*)    Creatinine, Ser 0.41 (*)    Calcium 7.9 (*)    Total Protein 4.5 (*)    Total Bilirubin <0.1 (*)    All other components within normal limits  GLUCOSE, CAPILLARY - Abnormal; Notable for the following:    Glucose-Capillary 144 (*)    All other components within normal limits  LIPASE, BLOOD  TRIGLYCERIDES  TRIGLYCERIDES  TRIGLYCERIDES   ____________________________________________  EKG  ED ECG REPORT I, Eula Listen, the attending physician, personally viewed and interpreted this ECG.   Date: 07/24/2016  EKG Time: 1252  Rate: 90  Rhythm: normal sinus rhythm  Axis: normal  Intervals:none  ST&T Change: No STEMI  ____________________________________________  RADIOLOGY  No results found.  ____________________________________________   PROCEDURES  Procedure(s) performed: None  Procedures  Critical Care performed: No ____________________________________________   INITIAL IMPRESSION / ASSESSMENT AND PLAN / ED COURSE  Pertinent labs & imaging results that were available during my care of the patient were reviewed by me and considered in my medical decision making (see chart for details).  40 y.o. male with a history of recurrent pancreatitis from familial hypertriglyceridemia presenting with 2 days of epigastric pain radiating to the back with associated nausea and vomiting. Overall, the patient is well-appearing with reassuring vital signs. Clinically, he has some tenderness to palpation in the epigastrium, but does not have significant signs of infectious etiology or dehydration. Awaiting the results of his labs, and we'll initiate symptomatic treatment at this time. Plan admission to the  hospital.  ----------------------------------------- 5:59 PM on 07/24/2016 ----------------------------------------- I had spoken with Dr. Doy Hutching for admission, but the patient has mild pain, stable vital signs, and triglycerides 600's without vomiting. However, the patient and his wife are adamant that the laboratory study was not run correctly. I've spoken with the lab, and they did report that his blood work was done on an automatic machine and not manually. I have requested a manual count.  ----------------------------------------- 7:45 PM on 07/24/2016 -----------------------------------------  The patient's repeat triglycerides do show a history of glycerin count is greater than 5000. He continues to have right upper quadrant pain, but no vomiting or diarrhea, no hemodynamic stability. We'll plan to admit him to the hospital for further evaluation and treatment.  ____________________________________________  FINAL CLINICAL IMPRESSION(S) / ED DIAGNOSES  Final diagnoses:  Hypertriglyceridemia  Other acute pancreatitis, unspecified complication status    Clinical Course      NEW MEDICATIONS STARTED DURING THIS VISIT:  New Prescriptions   HYDROMORPHONE (DILAUDID) 2 MG TABLET    Take 1 tablet (2 mg total) by mouth every 6 (six) hours as needed for severe pain.   ONDANSETRON (ZOFRAN) 4 MG TABLET    Take 1 tablet (4 mg total) by mouth every 8 (eight) hours as needed for nausea or vomiting.      Eula Listen, MD 07/24/16 1948

## 2016-07-25 DIAGNOSIS — Z885 Allergy status to narcotic agent status: Secondary | ICD-10-CM | POA: Diagnosis not present

## 2016-07-25 DIAGNOSIS — Z6834 Body mass index (BMI) 34.0-34.9, adult: Secondary | ICD-10-CM | POA: Diagnosis not present

## 2016-07-25 DIAGNOSIS — F419 Anxiety disorder, unspecified: Secondary | ICD-10-CM | POA: Diagnosis present

## 2016-07-25 DIAGNOSIS — E781 Pure hyperglyceridemia: Secondary | ICD-10-CM | POA: Diagnosis present

## 2016-07-25 DIAGNOSIS — Z79899 Other long term (current) drug therapy: Secondary | ICD-10-CM | POA: Diagnosis not present

## 2016-07-25 DIAGNOSIS — Z8711 Personal history of peptic ulcer disease: Secondary | ICD-10-CM | POA: Diagnosis not present

## 2016-07-25 DIAGNOSIS — R338 Other retention of urine: Secondary | ICD-10-CM | POA: Diagnosis present

## 2016-07-25 DIAGNOSIS — Z9884 Bariatric surgery status: Secondary | ICD-10-CM | POA: Diagnosis not present

## 2016-07-25 DIAGNOSIS — F329 Major depressive disorder, single episode, unspecified: Secondary | ICD-10-CM | POA: Diagnosis present

## 2016-07-25 DIAGNOSIS — H4922 Sixth [abducent] nerve palsy, left eye: Secondary | ICD-10-CM | POA: Diagnosis not present

## 2016-07-25 DIAGNOSIS — E871 Hypo-osmolality and hyponatremia: Secondary | ICD-10-CM | POA: Diagnosis present

## 2016-07-25 DIAGNOSIS — H532 Diplopia: Secondary | ICD-10-CM | POA: Diagnosis present

## 2016-07-25 DIAGNOSIS — K861 Other chronic pancreatitis: Secondary | ICD-10-CM | POA: Diagnosis present

## 2016-07-25 DIAGNOSIS — Z9641 Presence of insulin pump (external) (internal): Secondary | ICD-10-CM | POA: Diagnosis present

## 2016-07-25 DIAGNOSIS — K859 Acute pancreatitis without necrosis or infection, unspecified: Secondary | ICD-10-CM | POA: Diagnosis present

## 2016-07-25 DIAGNOSIS — N401 Enlarged prostate with lower urinary tract symptoms: Secondary | ICD-10-CM | POA: Diagnosis present

## 2016-07-25 DIAGNOSIS — H4920 Sixth [abducent] nerve palsy, unspecified eye: Secondary | ICD-10-CM | POA: Diagnosis present

## 2016-07-25 DIAGNOSIS — E876 Hypokalemia: Secondary | ICD-10-CM | POA: Diagnosis present

## 2016-07-25 DIAGNOSIS — E1165 Type 2 diabetes mellitus with hyperglycemia: Secondary | ICD-10-CM | POA: Diagnosis present

## 2016-07-25 DIAGNOSIS — Z9989 Dependence on other enabling machines and devices: Secondary | ICD-10-CM | POA: Diagnosis not present

## 2016-07-25 DIAGNOSIS — Z794 Long term (current) use of insulin: Secondary | ICD-10-CM | POA: Diagnosis not present

## 2016-07-25 DIAGNOSIS — Z8249 Family history of ischemic heart disease and other diseases of the circulatory system: Secondary | ICD-10-CM | POA: Diagnosis not present

## 2016-07-25 DIAGNOSIS — I10 Essential (primary) hypertension: Secondary | ICD-10-CM | POA: Diagnosis present

## 2016-07-25 DIAGNOSIS — G4733 Obstructive sleep apnea (adult) (pediatric): Secondary | ICD-10-CM | POA: Diagnosis present

## 2016-07-25 LAB — CBC
HCT: 37.9 % — ABNORMAL LOW (ref 40.0–52.0)
Hemoglobin: 13 g/dL (ref 13.0–18.0)
MCH: 27.3 pg (ref 26.0–34.0)
MCHC: 34.3 g/dL (ref 32.0–36.0)
MCV: 79.3 fL — ABNORMAL LOW (ref 80.0–100.0)
PLATELETS: 217 10*3/uL (ref 150–440)
RBC: 4.77 MIL/uL (ref 4.40–5.90)
RDW: 16.5 % — AB (ref 11.5–14.5)
WBC: 5.7 10*3/uL (ref 3.8–10.6)

## 2016-07-25 LAB — COMPREHENSIVE METABOLIC PANEL
ALK PHOS: 50 U/L (ref 38–126)
ALT: 19 U/L (ref 17–63)
AST: 18 U/L (ref 15–41)
Albumin: 4.1 g/dL (ref 3.5–5.0)
Anion gap: 9 (ref 5–15)
BILIRUBIN TOTAL: 1 mg/dL (ref 0.3–1.2)
BUN: 10 mg/dL (ref 6–20)
CALCIUM: 8.6 mg/dL — AB (ref 8.9–10.3)
CO2: 24 mmol/L (ref 22–32)
CREATININE: 0.66 mg/dL (ref 0.61–1.24)
Chloride: 105 mmol/L (ref 101–111)
Glucose, Bld: 133 mg/dL — ABNORMAL HIGH (ref 65–99)
Potassium: 3.5 mmol/L (ref 3.5–5.1)
Sodium: 138 mmol/L (ref 135–145)
TOTAL PROTEIN: 7.4 g/dL (ref 6.5–8.1)

## 2016-07-25 LAB — GLUCOSE, CAPILLARY
GLUCOSE-CAPILLARY: 138 mg/dL — AB (ref 65–99)
GLUCOSE-CAPILLARY: 131 mg/dL — AB (ref 65–99)
GLUCOSE-CAPILLARY: 132 mg/dL — AB (ref 65–99)
GLUCOSE-CAPILLARY: 118 mg/dL — AB (ref 65–99)
GLUCOSE-CAPILLARY: 114 mg/dL — AB (ref 65–99)
GLUCOSE-CAPILLARY: 122 mg/dL — AB (ref 65–99)
GLUCOSE-CAPILLARY: 117 mg/dL — AB (ref 65–99)
GLUCOSE-CAPILLARY: 144 mg/dL — AB (ref 65–99)
GLUCOSE-CAPILLARY: 135 mg/dL — AB (ref 65–99)
GLUCOSE-CAPILLARY: 135 mg/dL — AB (ref 65–99)
Glucose-Capillary: 177 mg/dL — ABNORMAL HIGH (ref 65–99)
Glucose-Capillary: 167 mg/dL — ABNORMAL HIGH (ref 65–99)
Glucose-Capillary: 146 mg/dL — ABNORMAL HIGH (ref 65–99)
Glucose-Capillary: 123 mg/dL — ABNORMAL HIGH (ref 65–99)
Glucose-Capillary: 129 mg/dL — ABNORMAL HIGH (ref 65–99)
Glucose-Capillary: 125 mg/dL — ABNORMAL HIGH (ref 65–99)
Glucose-Capillary: 126 mg/dL — ABNORMAL HIGH (ref 65–99)
Glucose-Capillary: 115 mg/dL — ABNORMAL HIGH (ref 65–99)
Glucose-Capillary: 123 mg/dL — ABNORMAL HIGH (ref 65–99)
Glucose-Capillary: 128 mg/dL — ABNORMAL HIGH (ref 65–99)
Glucose-Capillary: 123 mg/dL — ABNORMAL HIGH (ref 65–99)
Glucose-Capillary: 147 mg/dL — ABNORMAL HIGH (ref 65–99)

## 2016-07-25 LAB — TRIGLYCERIDES: Triglycerides: >5000 mg/dL — ABNORMAL HIGH (ref <150)

## 2016-07-25 MED ORDER — ACETAMINOPHEN 325 MG PO TABS
650.0000 mg | ORAL_TABLET | Freq: Four times a day (QID) | ORAL | Status: DC | PRN
  Administered 2016-07-25 – 2016-07-27 (×2): 650 mg via ORAL
  Filled 2016-07-25 (×2): qty 2

## 2016-07-25 MED ORDER — PROMETHAZINE HCL 25 MG/ML IJ SOLN
12.5000 mg | INTRAMUSCULAR | Status: DC | PRN
  Administered 2016-07-25: 12.5 mg via INTRAVENOUS
  Filled 2016-07-25: qty 1

## 2016-07-25 NOTE — Progress Notes (Signed)
[facility] [place] NAME: Sergio [name]    MR#:  [id number]  DATE OF BIRTH:  1975/10/13  SUBJECTIVE:  CHIEF COMPLAINT:   Chief Complaint  Patient presents with  . Abdominal Pain    Came with hypertriglyceridemia REVIEW OF SYSTEMS:  CONSTITUTIONAL: No fever, fatigue or weakness.  EYES: No blurred or double vision.  EARS, NOSE, AND THROAT: No tinnitus or ear pain.  RESPIRATORY: No cough, shortness of breath, wheezing or hemoptysis.  CARDIOVASCULAR: No chest pain, orthopnea, edema.  GASTROINTESTINAL: No nausea, vomiting, diarrhea or abdominal pain.  GENITOURINARY: No dysuria, hematuria.  ENDOCRINE: No polyuria, nocturia,  HEMATOLOGY: No anemia, easy bruising or bleeding SKIN: No rash or lesion. MUSCULOSKELETAL: No joint pain or arthritis.   NEUROLOGIC: No tingling, numbness, weakness.  PSYCHIATRY: No anxiety or depression.   ROS  DRUG ALLERGIES:   Allergies  Allergen Reactions  . Codeine Anaphylaxis  . Ivp Dye [Iodinated Diagnostic Agents] Other (See Comments)    Kidneys stop working    VITALS:  Blood pressure 119/88, pulse 73, temperature 98.5 F (36.9 C), temperature source Oral, resp. rate 14, height 5\' 11"  (1.803 m), weight 110.2 kg (242 lb 15.2 oz), SpO2 99 %.  PHYSICAL EXAMINATION:  GENERAL:  40 y.o.-year-old patient lying in the bed with no acute distress.  EYES: Pupils equal, round, reactive to light and accommodation. No scleral icterus. Extraocular muscles intact.  HEENT: Head atraumatic, normocephalic. Oropharynx and nasopharynx clear.  NECK:  Supple, no jugular venous distention. No thyroid enlargement, no tenderness.  LUNGS: Normal breath sounds bilaterally, no wheezing, rales,rhonchi or crepitation. No use of accessory muscles of respiration.  CARDIOVASCULAR: S1, S2 normal. No murmurs, rubs, or gallops.  ABDOMEN: Soft,  Mild tender, nondistended. Bowel sounds present. No organomegaly or mass.  EXTREMITIES: No  pedal edema, cyanosis, or clubbing.  NEUROLOGIC: Cranial nerves II through XII are intact. Muscle strength 5/5 in all extremities. Sensation intact. Gait not checked.  PSYCHIATRIC: The patient is alert and oriented x 3.  SKIN: No obvious rash, lesion, or ulcer.   Physical Exam LABORATORY PANEL:   CBC  Recent Labs Lab 07/25/16 0452  WBC 5.7  HGB 13.0  HCT 37.9*  PLT 217   ------------------------------------------------------------------------------------------------------------------  Chemistries   Recent Labs Lab 07/24/16 1809 07/25/16 0452  NA  --  138  K  --  3.5  CL  --  105  CO2  --  24  GLUCOSE  --  133*  BUN  --  10  CREATININE  --  0.66  CALCIUM  --  8.6*  MG 5.1*  --   AST  --  18  ALT  --  19  ALKPHOS  --  50  BILITOT  --  1.0   ------------------------------------------------------------------------------------------------------------------  Cardiac Enzymes No results for input(s): TROPONINI in the last 168 hours. ------------------------------------------------------------------------------------------------------------------  RADIOLOGY:  No results found.  ASSESSMENT AND PLAN:   Active Problems:   Hypertriglyceridemia   Chronic pancreatitis (HCC)   Hyponatremia   Hypokalemia  #1, hypertriglyceridemia,    insulin IV drip, follow triglycerides daily, continue Lopid and omega-3 fatty acids, get endocrinologist for further recommendations   #2 chronic pancreatitis exacerbation, supportive therapy with IV fluids, pain medications #3. Hyponatremia, IV fluids with normal saline, follow sodium level in the morning #4. Hypokalemia, supplement intravenously, check magnesium level, supplemented as needed #5. BPH, advance patient's finasteride and Flomax, per outpatient urologist recommendations #6. Diabetes mellitus, continue insulin IV drip for now, endocrinologist  consultation is requested,   Due to requirement of frequent blood sugar monitoring  and insulin drip , he is in step down unti, may transfer to floor - if possible to provide this care.  All the records are reviewed and case discussed with Care Management/Social Workerr. Management plans discussed with the patient, family and they are in agreement.  CODE STATUS: full  TOTAL TIME TAKING CARE OF THIS PATIENT: 35 minutes.     POSSIBLE D/C IN 2-3 DAYS, DEPENDING ON CLINICAL CONDITION.   Vaughan Basta M.D on 07/25/2016   Between 7am to 6pm - Pager - [phone]  After 6pm go to [email or web address] - password [email or web address] [facility]  Office  [phone]  CC: Primary care physician; Crecencio Mc, MD  Note: This dictation was prepared with Dragon dictation along with smaller phrase technology. Any transcriptional errors that result from this process are unintentional.

## 2016-07-26 ENCOUNTER — Inpatient Hospital Stay: Payer: BLUE CROSS/BLUE SHIELD

## 2016-07-26 LAB — HEMOGLOBIN A1C
Hgb A1c MFr Bld: 7.9 % — ABNORMAL HIGH (ref 4.8–5.6)
Mean Plasma Glucose: 180 mg/dL

## 2016-07-26 LAB — GLUCOSE, CAPILLARY
GLUCOSE-CAPILLARY: 118 mg/dL — AB (ref 65–99)
GLUCOSE-CAPILLARY: 127 mg/dL — AB (ref 65–99)
GLUCOSE-CAPILLARY: 131 mg/dL — AB (ref 65–99)
GLUCOSE-CAPILLARY: 133 mg/dL — AB (ref 65–99)
GLUCOSE-CAPILLARY: 143 mg/dL — AB (ref 65–99)
GLUCOSE-CAPILLARY: 92 mg/dL (ref 65–99)
GLUCOSE-CAPILLARY: 93 mg/dL (ref 65–99)
GLUCOSE-CAPILLARY: 94 mg/dL (ref 65–99)
GLUCOSE-CAPILLARY: 95 mg/dL (ref 65–99)
GLUCOSE-CAPILLARY: 97 mg/dL (ref 65–99)
GLUCOSE-CAPILLARY: 97 mg/dL (ref 65–99)
Glucose-Capillary: 105 mg/dL — ABNORMAL HIGH (ref 65–99)
Glucose-Capillary: 109 mg/dL — ABNORMAL HIGH (ref 65–99)
Glucose-Capillary: 110 mg/dL — ABNORMAL HIGH (ref 65–99)
Glucose-Capillary: 111 mg/dL — ABNORMAL HIGH (ref 65–99)
Glucose-Capillary: 114 mg/dL — ABNORMAL HIGH (ref 65–99)
Glucose-Capillary: 206 mg/dL — ABNORMAL HIGH (ref 65–99)
Glucose-Capillary: 87 mg/dL (ref 65–99)
Glucose-Capillary: 87 mg/dL (ref 65–99)
Glucose-Capillary: 96 mg/dL (ref 65–99)
Glucose-Capillary: 99 mg/dL (ref 65–99)
Glucose-Capillary: 99 mg/dL (ref 65–99)

## 2016-07-26 LAB — MAGNESIUM: MAGNESIUM: 2.9 mg/dL — AB (ref 1.7–2.4)

## 2016-07-26 LAB — BASIC METABOLIC PANEL
Anion gap: 10 (ref 5–15)
BUN: 6 mg/dL (ref 6–20)
CHLORIDE: 93 mmol/L — AB (ref 101–111)
CO2: 25 mmol/L (ref 22–32)
CREATININE: 0.68 mg/dL (ref 0.61–1.24)
Calcium: 8.5 mg/dL — ABNORMAL LOW (ref 8.9–10.3)
GFR calc Af Amer: >60 mL/min (ref >60)
GFR calc non Af Amer: >60 mL/min (ref >60)
Glucose, Bld: 99 mg/dL (ref 65–99)
Potassium: 4.2 mmol/L (ref 3.5–5.1)
SODIUM: 128 mmol/L — AB (ref 135–145)

## 2016-07-26 LAB — PHOSPHORUS: Phosphorus: 4.5 mg/dL (ref 2.5–4.6)

## 2016-07-26 LAB — TRIGLYCERIDES

## 2016-07-26 MED ORDER — SODIUM CHLORIDE 4 MEQ/ML IV SOLN
INTRAVENOUS | Status: DC
  Administered 2016-07-26 – 2016-07-27 (×2): via INTRAVENOUS
  Filled 2016-07-26 (×4): qty 1000

## 2016-07-26 NOTE — Progress Notes (Signed)
Dr Anselm Jungling notified of persistent double vision.  Head CT ordered

## 2016-07-26 NOTE — Progress Notes (Signed)
MEDICATION RELATED CONSULT NOTE - INITIAL   Pharmacy Consult for Electrolyte Management Indication: Insulin Drip for hypertriglyceridemia   Allergies  Allergen Reactions  . Codeine Anaphylaxis  . Ivp Dye [Iodinated Diagnostic Agents] Other (See Comments)    Kidneys stop working    Patient Measurements: Height: 5\' 11"  (180.3 cm) Weight: 242 lb 15.2 oz (110.2 kg) IBW/kg (Calculated) : 75.3  Vital Signs: Temp: 98.5 F (36.9 C) (10/16 1200) Temp Source: Oral (10/16 1200) BP: 123/85 (10/16 1300) Pulse Rate: 75 (10/16 1300) Intake/Output from previous day: 10/15 0701 - 10/16 0700 In: 2578 [I.V.:2568] Out: 325 [Urine:325] Intake/Output from this shift: Total I/O In: 702 [I.V.:642; Other:60] Out: 575 [Urine:575]  Labs:  Recent Labs  07/24/16 1123 07/24/16 1333 07/24/16 1809 07/25/16 0452 07/26/16 1223  WBC 5.3  --   --  5.7  --   HGB UNABLE TO REPORT DUE TO LIPEMIC INTERFERENCE  --   --  13.0  --   HCT 40.7  --   --  37.9*  --   PLT 273  --   --  217  --   CREATININE 0.78 0.41*  --  0.66 0.68  MG  --   --  5.1*  --  2.9*  PHOS  --   --   --   --  4.5  ALBUMIN 4.9 4.2  --  4.1  --   PROT 5.1* 4.5*  --  7.4  --   AST 21 19  --  18  --   ALT 22 19  --  19  --   ALKPHOS 71 46  --  50  --   BILITOT <0.1* <0.1*  --  1.0  --    Estimated Creatinine Clearance: 158.1 mL/min (by C-G formula based on SCr of 0.68 mg/dL).   Microbiology: Recent Results (from the past 720 hour(s))  MRSA PCR Screening     Status: None   Collection Time: 07/24/16 10:23 PM  Result Value Ref Range Status   MRSA by PCR NEGATIVE NEGATIVE Final    Comment:        The GeneXpert MRSA Assay (FDA approved for NASAL specimens only), is one component of a comprehensive MRSA colonization surveillance program. It is not intended to diagnose MRSA infection nor to guide or monitor treatment for MRSA infections.     Medical History: Past Medical History:  Diagnosis Date  . Anxiety   . Chicken  pox   . Depression   . Diabetes mellitus   . Familial hypertriglyceridemia    severe  . Gastric ulcer 2009  . HTN (hypertension)   . Morbid obesity ([place])    s/p bariatric sleeve surgery 01/2015  . Recurrent acute pancreatitis    secondary to hypertriglyceridemia   . Sleep apnea 1999   uses CPAP    Assessment: 40 y/o M with a h/o familial hypertriglyceridemia, related pancreatitis, DM, HTN,  and obesity on insulin pump admitted with elevated TG.   Plan:  K WNL on repeat and patient receiving d101/2NS w/ 40 meq/L at 75 ml/hr. Will f/u AM labs.   Ulice Dash D 07/26/2016,2:52 PM

## 2016-07-26 NOTE — Progress Notes (Signed)
[name] to the floor to talk to patient.  Dr Anselm Jungling addressed double vision with patient, at this point we will monitor only

## 2016-07-26 NOTE — Consult Note (Addendum)
ENDOCRINOLOGY CONSULTATION  REFERRING PHYSICIAN:  Myrla Halsted, MD CONSULTING PHYSICIAN:  A. Lavone Orn, MD. PRIMARY CARE PROVIDER:  [name], MD.  CHIEF COMPLAINT:  Hypertriglyceridemia-induced pancreatitis  HISTORY OF PRESENT ILLNESS:  40 y.o. male with type 2 diabetes and familial hypertriglyceridemia with recurrent acute pancreatitis due to hypertriglyceridemia admitted yesterday with acute pancreatitis. He was found to have triglyceride level >5000, lipase nml at 17 and glucose of 184 upon admission.  He was admitted to the ICU and started on IVF and IV insulin. He is NPO. Endocrinology consulted for further management.  Patient is well known to me. His diabetes is managed with an insulin pump.  He removed his insulin pump yesterday before presenting to the ER. Reports sugars were in the 70 - 180 range in days prior to admission. Hypertriglyceridemia typically managed with gemfibrozil 600 mg bid + fish oil + Crestor 10 mg daily. He reports adherence with his medications. He reports no dietary indiscretions. Denies alcohol use. Reports onset of abd pain and nausea one day prior to presentation. No emesis. Currently, the patient reports 7/10 abdominal pain, not much improved from admission.  Currently, he is receiving IV insulin infusion at 7 u/hr and D5NS at 100 units/hr and his BG has been in the 90s.  PAST MEDICAL HISTORY:  Past Medical History:  Diagnosis Date  . Anxiety   . Chicken pox   . Depression   . Diabetes mellitus   . Familial hypertriglyceridemia    severe  . Gastric ulcer 2009  . HTN (hypertension)   . Morbid obesity ([place])    s/p bariatric sleeve surgery 01/2015  . Recurrent acute pancreatitis    secondary to hypertriglyceridemia   . Sleep apnea 1999   uses CPAP     CURRENT MEDICATIONS:  . docusate sodium  100 mg Oral BID  . finasteride  10 mg Oral Daily  . gemfibrozil  600 mg Oral BID AC  . omega-3 acid ethyl esters  4 g Oral QHS  . pregabalin  300  mg Oral QHS  . sertraline  100 mg Oral Daily  . sodium chloride flush  3 mL Intravenous Q12H  . tamsulosin  0.8 mg Oral Daily  . traZODone  100 mg Oral QHS     SOCIAL HISTORY:  Social History  Substance Use Topics  . Smoking status: Never Smoker  . Smokeless tobacco: Never Used  . Alcohol use No     Comment: once month     FAMILY HISTORY:   Family History  Problem Relation Age of Onset  . Adopted: Yes  . Hypertension Mother   . Hyperlipidemia Mother   . Hypertension Father   . Hyperlipidemia Father      ALLERGIES:  Allergies  Allergen Reactions  . Codeine Anaphylaxis  . Ivp Dye [Iodinated Diagnostic Agents] Other (See Comments)    Kidneys stop working    REVIEW OF SYSTEMS:  GENERAL:  No weight loss.  No fever.  HEENT:  + blurred vision, onset today. No sore throat.  NECK:  No neck pain or dysphagia.  CARDIAC:  No chest pain or palpitation.  PULMONARY:  No cough or shortness of breath.  ABDOMEN:   No constipation. EXTREMITIES:  No lower extremity swelling.  ENDOCRINE:  No heat or cold intolerance.  GENITOURINARY:  No dysuria or hematuria. SKIN:  No recent rash. +onset xanthomas 2 weeks ago.   PHYSICAL EXAMINATION:  BP (!) 126/99 (BP Location: Left Arm)   Pulse (!) 104   Temp  98.5 F (36.9 C) (Oral)   Resp 18   Ht 5\' 11"  (1.803 m)   Wt 110.2 kg (242 lb 15.2 oz)   SpO2 95%   BMI 33.88 kg/m   GENERAL:  Well-developed male in NAD. HEENT:  EOMI.  Oropharynx is clear.  NECK:  Supple.  No thyromegaly.  No neck tenderness.  CARDIAC:  Regular rate and rhythm without murmur.  PULMONARY:  Clear to auscultation bilaterally.  ABDOMEN:  Diffusely soft, + epigastric tenderness, nondistended.  EXTREMITIES:  No peripheral edema is present.    SKIN:  No rash or dermatopathy. Few flesh colored xanthomas on the arms. NEUROLOGIC:  No dysarthria.  No tremor. PSYCHIATRIC:  Alert and oriented, calm, cooperative.   LABORATORY DATA:  Results for orders placed or  performed during the hospital encounter of 07/24/16 (from the past 24 hour(s))  Glucose, capillary     Status: Abnormal   Collection Time: 07/25/16  2:00 PM  Result Value Ref Range   Glucose-Capillary 115 (H) 65 - 99 mg/dL  Glucose, capillary     Status: Abnormal   Collection Time: 07/25/16  3:00 PM  Result Value Ref Range   Glucose-Capillary 122 (H) 65 - 99 mg/dL  Glucose, capillary     Status: Abnormal   Collection Time: 07/25/16  4:14 PM  Result Value Ref Range   Glucose-Capillary 117 (H) 65 - 99 mg/dL  Glucose, capillary     Status: Abnormal   Collection Time: 07/25/16  5:17 PM  Result Value Ref Range   Glucose-Capillary 123 (H) 65 - 99 mg/dL  Glucose, capillary     Status: Abnormal   Collection Time: 07/25/16  6:24 PM  Result Value Ref Range   Glucose-Capillary 128 (H) 65 - 99 mg/dL  Glucose, capillary     Status: Abnormal   Collection Time: 07/25/16  7:08 PM  Result Value Ref Range   Glucose-Capillary 123 (H) 65 - 99 mg/dL  Glucose, capillary     Status: Abnormal   Collection Time: 07/25/16  8:02 PM  Result Value Ref Range   Glucose-Capillary 144 (H) 65 - 99 mg/dL  Glucose, capillary     Status: Abnormal   Collection Time: 07/25/16  8:55 PM  Result Value Ref Range   Glucose-Capillary 147 (H) 65 - 99 mg/dL  Glucose, capillary     Status: Abnormal   Collection Time: 07/25/16 10:01 PM  Result Value Ref Range   Glucose-Capillary 135 (H) 65 - 99 mg/dL  Glucose, capillary     Status: Abnormal   Collection Time: 07/25/16 10:55 PM  Result Value Ref Range   Glucose-Capillary 135 (H) 65 - 99 mg/dL  Glucose, capillary     Status: Abnormal   Collection Time: 07/26/16 12:02 AM  Result Value Ref Range   Glucose-Capillary 143 (H) 65 - 99 mg/dL  Glucose, capillary     Status: Abnormal   Collection Time: 07/26/16  1:04 AM  Result Value Ref Range   Glucose-Capillary 133 (H) 65 - 99 mg/dL  Glucose, capillary     Status: Abnormal   Collection Time: 07/26/16  1:56 AM  Result Value  Ref Range   Glucose-Capillary 127 (H) 65 - 99 mg/dL  Glucose, capillary     Status: Abnormal   Collection Time: 07/26/16  3:01 AM  Result Value Ref Range   Glucose-Capillary 131 (H) 65 - 99 mg/dL  Triglycerides     Status: Abnormal   Collection Time: 07/26/16  3:17 AM  Result Value Ref Range  Triglycerides >5,000 (H) <150 mg/dL  Glucose, capillary     Status: Abnormal   Collection Time: 07/26/16  4:07 AM  Result Value Ref Range   Glucose-Capillary 109 (H) 65 - 99 mg/dL  Glucose, capillary     Status: Abnormal   Collection Time: 07/26/16  4:56 AM  Result Value Ref Range   Glucose-Capillary 110 (H) 65 - 99 mg/dL  Glucose, capillary     Status: Abnormal   Collection Time: 07/26/16  6:09 AM  Result Value Ref Range   Glucose-Capillary 105 (H) 65 - 99 mg/dL  Glucose, capillary     Status: Abnormal   Collection Time: 07/26/16  6:53 AM  Result Value Ref Range   Glucose-Capillary 114 (H) 65 - 99 mg/dL  Glucose, capillary     Status: Abnormal   Collection Time: 07/26/16  7:58 AM  Result Value Ref Range   Glucose-Capillary 111 (H) 65 - 99 mg/dL  Glucose, capillary     Status: None   Collection Time: 07/26/16  9:01 AM  Result Value Ref Range   Glucose-Capillary 97 65 - 99 mg/dL  Glucose, capillary     Status: None   Collection Time: 07/26/16 10:03 AM  Result Value Ref Range   Glucose-Capillary 93 65 - 99 mg/dL  Glucose, capillary     Status: None   Collection Time: 07/26/16 11:04 AM  Result Value Ref Range   Glucose-Capillary 94 65 - 99 mg/dL  Glucose, capillary     Status: None   Collection Time: 07/26/16 12:31 PM  Result Value Ref Range   Glucose-Capillary 96 65 - 99 mg/dL   Lab Results  Component Value Date   HGBA1C 7.9 (H) 07/24/2016     ASSESSMENT:  1.Acute pancreatitis due to hypertriglyceridemia. 2. Familial hypertriglyceridemia 3. Type 2 diabetes    PLAN: 1.   Continue IVF and IV insulin. However recommend we increase IV insulin to 12 units/hr. Change  dextrose to D10 and adjust rate with goal to keep sugars in the 120-180 mg/dL range.  2. Keep NPO strictly including oral medications if possible. 3. Trend triglycerides daily and continue IV insulin until serum Tg level <1000. 4. Continue fibrate, and fish oil for now. Resume statin once he improves clinically. 5. He will need to restart insulin pump once ready to transition off the IV insulin. He will need to have family bring in the necessary supplies. Prior trend has been 4-5 days before his Tg level is <1000.  Will follow along with you.

## 2016-07-26 NOTE — Progress Notes (Addendum)
Patient has a history of DM2 and uses an insulin pump with Humalog insulin for glycemic control as an outpatient. Patient was admitted on 07/24/16 with hypertriglyceridemia which is being treated with IV insulin drip. Noted MD progress note stated 'get endocrinologist for further recommendations'. However, routine Endocrinologist consults no longer available at [facility] unless patient is followed by Dr. Gabriel Carina and has an insulin pump. Otherwise, Endocrinologist is available for attending MD to call to discuss individual cases for recommendations if assistance needed while inpatient. Spoke with Malachy Mood, RN and she verifies that patient does not have on insulin pump at this time.  Noted in chart review per Care Everywhere, patient is followed by Dr. Gabriel Carina and was last seen by her on 02/06/16. At that time patient's insulin pump settings were as follows (per Dr. Joycie Peek office note dated 02/06/16):  Current settings: Basal rates 12 am 2.85 units/hr 8 am 2.95 units/hr 12 pm 2.6 units/hr 24-hr basal = 65.8 units  Bolus settings I:C ratio 12 am 3, 12 pm 3, 3 pm 2 Sensitivity 12 am 19, 4 pm 17 Target 100-110  Since patient is requiring IV insulin at this time for treatment of hypertriglyceridemia, attending MD may want to consider calling Dr. Gabriel Carina to discuss patient and recommendations.    Thanks, Barnie Alderman, RN, MSN, CDE Diabetes Coordinator Inpatient Diabetes Program [phone] (Team Pager from [place] to [place]) [phone] ([place] office) [phone] [facility] office) [phone] [facility] office)

## 2016-07-26 NOTE — Progress Notes (Signed)
[place] [facility] [place] NAME: Sergio [name]    MR#:  [id number]  DATE OF BIRTH:  07-29-1976  SUBJECTIVE:  CHIEF COMPLAINT:   Chief Complaint  Patient presents with  . Abdominal Pain    Came with hypertriglyceridemia, still TG > 5000.   Had c/o double vision this morning, no other complains of numbness or weakness.  REVIEW OF SYSTEMS:  CONSTITUTIONAL: No fever, fatigue or weakness.  EYES: No blurred or double vision.  EARS, NOSE, AND THROAT: No tinnitus or ear pain.  RESPIRATORY: No cough, shortness of breath, wheezing or hemoptysis.  CARDIOVASCULAR: No chest pain, orthopnea, edema.  GASTROINTESTINAL: No nausea, vomiting, diarrhea or abdominal pain.  GENITOURINARY: No dysuria, hematuria.  ENDOCRINE: No polyuria, nocturia,  HEMATOLOGY: No anemia, easy bruising or bleeding SKIN: No rash or lesion. MUSCULOSKELETAL: No joint pain or arthritis.   NEUROLOGIC: No tingling, numbness, weakness.  PSYCHIATRY: No anxiety or depression.   ROS  DRUG ALLERGIES:   Allergies  Allergen Reactions  . Codeine Anaphylaxis  . Ivp Dye [Iodinated Diagnostic Agents] Other (See Comments)    Kidneys stop working    VITALS:  Blood pressure 123/85, pulse 75, temperature 98.5 F (36.9 C), temperature source Oral, resp. rate 18, height 5\' 11"  (1.803 m), weight 110.2 kg (242 lb 15.2 oz), SpO2 96 %.  PHYSICAL EXAMINATION:  GENERAL:  40 y.o.-year-old patient lying in the bed with no acute distress.  EYES: Pupils equal, round, reactive to light and accommodation. No scleral icterus. Extraocular muscles intact.  HEENT: Head atraumatic, normocephalic. Oropharynx and nasopharynx clear.  NECK:  Supple, no jugular venous distention. No thyroid enlargement, no tenderness.  LUNGS: Normal breath sounds bilaterally, no wheezing, rales,rhonchi or crepitation. No use of accessory muscles of respiration.  CARDIOVASCULAR: S1, S2 normal. No murmurs, rubs, or gallops.  ABDOMEN:  Soft,  Mild tender, nondistended. Bowel sounds present. No organomegaly or mass.  EXTREMITIES: No pedal edema, cyanosis, or clubbing.  NEUROLOGIC: Cranial nerves II through XII are intact. Muscle strength 5/5 in all extremities. Sensation intact. Gait not checked.  PSYCHIATRIC: The patient is alert and oriented x 3.  SKIN: No obvious rash, lesion, or ulcer.   Physical Exam LABORATORY PANEL:   CBC  Recent Labs Lab 07/25/16 0452  WBC 5.7  HGB 13.0  HCT 37.9*  PLT 217   ------------------------------------------------------------------------------------------------------------------  Chemistries   Recent Labs Lab 07/25/16 0452 07/26/16 1223  NA 138 128*  K 3.5 4.2  CL 105 93*  CO2 24 25  GLUCOSE 133* 99  BUN 10 6  CREATININE 0.66 0.68  CALCIUM 8.6* 8.5*  MG  --  2.9*  AST 18  --   ALT 19  --   ALKPHOS 50  --   BILITOT 1.0  --    ------------------------------------------------------------------------------------------------------------------  Cardiac Enzymes No results for input(s): TROPONINI in the last 168 hours. ------------------------------------------------------------------------------------------------------------------  RADIOLOGY:  No results found.  ASSESSMENT AND PLAN:   Active Problems:   Hypertriglyceridemia   Chronic pancreatitis (HCC)   Hyponatremia   Hypokalemia  #1, hypertriglyceridemia,    insulin IV drip, follow triglycerides daily, continue Lopid and omega-3 fatty acids,    Appreciated Dr.Solum's help to manage hypertriglyceridemia. #2 chronic pancreatitis exacerbation, supportive therapy with IV fluids, pain medications #3. Hyponatremia, IV fluids with normal saline, follow sodium level in the morning #4. Hypokalemia, supplement intravenously, check magnesium level, supplemented as needed   Now added K to IV fluids. #5. BPH, advance patient's finasteride and Flomax,  per outpatient urologist recommendations #6. Diabetes mellitus,  continue insulin IV drip for now, endocrinologist consultation is appreciated,     Need to start back on insulin pump once TG is under control.  Due to requirement of frequent blood sugar monitoring and insulin drip , he is in step down unit, may transfer to floor - if possible to provide this care.  All the records are reviewed and case discussed with Care Management/Social Workerr. Management plans discussed with the patient, family and they are in agreement.  CODE STATUS: full  TOTAL TIME TAKING CARE OF THIS PATIENT: 35 minutes.    POSSIBLE D/C IN 2-3 DAYS, DEPENDING ON CLINICAL CONDITION.   Vaughan Basta M.D on 07/26/2016   Between 7am to 6pm - Pager - [phone]  After 6pm go to [email or web address] - password [email or web address] [place] [facility]  Office  [phone]  CC: Primary care physician; Crecencio Mc, MD  Note: This dictation was prepared with Dragon dictation along with smaller phrase technology. Any transcriptional errors that result from this process are unintentional.

## 2016-07-27 ENCOUNTER — Inpatient Hospital Stay: Payer: BLUE CROSS/BLUE SHIELD

## 2016-07-27 DIAGNOSIS — H4922 Sixth [abducent] nerve palsy, left eye: Secondary | ICD-10-CM

## 2016-07-27 LAB — BASIC METABOLIC PANEL
ANION GAP: 12 (ref 5–15)
Anion gap: 9 (ref 5–15)
Anion gap: 9 (ref 5–15)
BUN: <5 mg/dL — ABNORMAL LOW (ref 6–20)
CALCIUM: 8.6 mg/dL — AB (ref 8.9–10.3)
CHLORIDE: 100 mmol/L — AB (ref 101–111)
CHLORIDE: 98 mmol/L — AB (ref 101–111)
CO2: 28 mmol/L (ref 22–32)
CO2: 26 mmol/L (ref 22–32)
CO2: 27 mmol/L (ref 22–32)
CREATININE: 1.23 mg/dL (ref 0.61–1.24)
CREATININE: 0.78 mg/dL (ref 0.61–1.24)
Calcium: 8.5 mg/dL — ABNORMAL LOW (ref 8.9–10.3)
Calcium: 8.5 mg/dL — ABNORMAL LOW (ref 8.9–10.3)
Chloride: 97 mmol/L — ABNORMAL LOW (ref 101–111)
Creatinine, Ser: 0.98 mg/dL (ref 0.61–1.24)
GFR calc Af Amer: >60 mL/min (ref >60)
GFR calc non Af Amer: >60 mL/min (ref >60)
GLUCOSE: 116 mg/dL — AB (ref 65–99)
Glucose, Bld: 77 mg/dL (ref 65–99)
Glucose, Bld: 141 mg/dL — ABNORMAL HIGH (ref 65–99)
POTASSIUM: 5.3 mmol/L — AB (ref 3.5–5.1)
POTASSIUM: 3.9 mmol/L (ref 3.5–5.1)
Potassium: 5.5 mmol/L — ABNORMAL HIGH (ref 3.5–5.1)
SODIUM: 135 mmol/L (ref 135–145)
SODIUM: 134 mmol/L — AB (ref 135–145)
Sodium: 137 mmol/L (ref 135–145)

## 2016-07-27 LAB — GLUCOSE, CAPILLARY
GLUCOSE-CAPILLARY: 91 mg/dL (ref 65–99)
GLUCOSE-CAPILLARY: 84 mg/dL (ref 65–99)
GLUCOSE-CAPILLARY: 68 mg/dL (ref 65–99)
GLUCOSE-CAPILLARY: 62 mg/dL — AB (ref 65–99)
GLUCOSE-CAPILLARY: 114 mg/dL — AB (ref 65–99)
GLUCOSE-CAPILLARY: 87 mg/dL (ref 65–99)
GLUCOSE-CAPILLARY: 116 mg/dL — AB (ref 65–99)
GLUCOSE-CAPILLARY: 131 mg/dL — AB (ref 65–99)
GLUCOSE-CAPILLARY: 127 mg/dL — AB (ref 65–99)
GLUCOSE-CAPILLARY: 110 mg/dL — AB (ref 65–99)
GLUCOSE-CAPILLARY: 80 mg/dL (ref 65–99)
GLUCOSE-CAPILLARY: 70 mg/dL (ref 65–99)
Glucose-Capillary: 101 mg/dL — ABNORMAL HIGH (ref 65–99)
Glucose-Capillary: 137 mg/dL — ABNORMAL HIGH (ref 65–99)
Glucose-Capillary: 72 mg/dL (ref 65–99)
Glucose-Capillary: 75 mg/dL (ref 65–99)
Glucose-Capillary: 79 mg/dL (ref 65–99)
Glucose-Capillary: 79 mg/dL (ref 65–99)
Glucose-Capillary: 81 mg/dL (ref 65–99)
Glucose-Capillary: 82 mg/dL (ref 65–99)
Glucose-Capillary: 94 mg/dL (ref 65–99)
Glucose-Capillary: 97 mg/dL (ref 65–99)

## 2016-07-27 LAB — SEDIMENTATION RATE: Sed Rate: 37 mm/hr — ABNORMAL HIGH (ref 0–15)

## 2016-07-27 LAB — PHOSPHORUS: PHOSPHORUS: 5.5 mg/dL — AB (ref 2.5–4.6)

## 2016-07-27 LAB — C-REACTIVE PROTEIN: CRP: <0.8 mg/dL (ref <1.0)

## 2016-07-27 LAB — MAGNESIUM: MAGNESIUM: 1.9 mg/dL (ref 1.7–2.4)

## 2016-07-27 LAB — FOLATE: Folate: 19.8 ng/mL (ref >5.9)

## 2016-07-27 LAB — VITAMIN B12: Vitamin B-12: 265 pg/mL (ref 180–914)

## 2016-07-27 LAB — TRIGLYCERIDES: Triglycerides: >5000 mg/dL — ABNORMAL HIGH (ref <150)

## 2016-07-27 MED ORDER — FINASTERIDE 5 MG PO TABS
5.0000 mg | ORAL_TABLET | Freq: Every day | ORAL | Status: DC
  Administered 2016-07-28 – 2016-08-04 (×8): 5 mg via ORAL
  Filled 2016-07-27 (×8): qty 1

## 2016-07-27 MED ORDER — POTASSIUM CHLORIDE CRYS ER 20 MEQ PO TBCR
40.0000 meq | EXTENDED_RELEASE_TABLET | Freq: Two times a day (BID) | ORAL | Status: AC
  Administered 2016-07-27 – 2016-07-28 (×2): 40 meq via ORAL
  Filled 2016-07-27 (×2): qty 2

## 2016-07-27 MED ORDER — DEXTROSE 10 % IV SOLN
INTRAVENOUS | Status: DC
  Administered 2016-07-27: 11:00:00 via INTRAVENOUS

## 2016-07-27 MED ORDER — DEXTROSE 50 % IV SOLN
25.0000 mL | Freq: Once | INTRAVENOUS | Status: AC
  Administered 2016-07-27: 25 mL via INTRAVENOUS

## 2016-07-27 MED ORDER — SODIUM CHLORIDE 4 MEQ/ML IV SOLN
INTRAVENOUS | Status: DC
  Administered 2016-07-27 – 2016-08-03 (×14): via INTRAVENOUS
  Filled 2016-07-27 (×32): qty 1000

## 2016-07-27 MED ORDER — POTASSIUM CHLORIDE 20 MEQ PO PACK: 40.0000 meq | PACK | Freq: Once | ORAL | Status: DC

## 2016-07-27 MED ORDER — PNEUMOCOCCAL VAC POLYVALENT 25 MCG/0.5ML IJ INJ
0.5000 mL | INJECTION | INTRAMUSCULAR | Status: DC
  Filled 2016-07-27 (×2): qty 0.5

## 2016-07-27 MED ORDER — ENOXAPARIN SODIUM 40 MG/0.4ML ~~LOC~~ SOLN
40.0000 mg | SUBCUTANEOUS | Status: DC
  Administered 2016-07-27 – 2016-08-04 (×9): 40 mg via SUBCUTANEOUS
  Filled 2016-07-27 (×9): qty 0.4

## 2016-07-27 NOTE — Progress Notes (Signed)
Patient having difficulty voiding urine. Stated he felt he needed to go but was unable. Patient with history of difficulty voiding, takes proscar and flomax as an outpatient. In the past has had difficulty voiding more so than normal when receiving dilaudid. Outpatient urologist had recommended increasing doses of flomax and proscar which MD had done, but inability to void persisted. Dr. Anselm Jungling at bedside with patient and ordered RN to bladder scan patient and if amount greater than 500 mL to place a foley catheter. RN bladder scanned patient and bladder scanner value of >501 mL of urine (bladder scanner unable to get more a precise value). RN to place foley catheter.

## 2016-07-27 NOTE — Progress Notes (Signed)
Endocrinology follow up  REFERRING PHYSICIAN:  Myrla Halsted, MD CONSULTING PHYSICIAN:  A. Lavone Orn, MD. PRIMARY CARE PROVIDER: [name], MD.  History of present illness Sergio [name] is a 40 y.o. male seen in follow up for type 2 diabetes and familial hypertriglyceridemia with recurrent acute pancreatitis due to hypertriglyceridemia. He was admitted on 07/25/16 with acute pancreatitis.  Now receiving insulin 12 units/hr and D10 + 40 KCl at 75 cc/hr. Sugars have been in the 60-90s today. He had a low of 68 at 6 AM and a low of 62 at 9:30 AM. He was treated with 1 amp D25 with good improvement. He reports persistent blurred vision, onset yesterday. Head CT neg. No headache. Persistent abd pain. No N/V. Asks to try a popsicle. Appetite still low.  Medical history Past Medical History:  Diagnosis Date  . Anxiety   . Chicken pox   . Depression   . Diabetes mellitus   . Familial hypertriglyceridemia    severe  . Gastric ulcer 2009  . HTN (hypertension)   . Morbid obesity ([place])    s/p bariatric sleeve surgery 01/2015  . Recurrent acute pancreatitis    secondary to hypertriglyceridemia   . Sleep apnea 1999   uses CPAP     Surgical history Past Surgical History:  Procedure Laterality Date  . CHOLECYSTECTOMY    . LAPAROSCOPIC GASTRIC SLEEVE RESECTION    . [place] SURGERY  2008  . TONSILLECTOMY  2003  . VASECTOMY       Medications . [COMPLETED] dextrose  25 mL Intravenous Once  . docusate sodium  100 mg Oral BID  . finasteride  10 mg Oral Daily  . gemfibrozil  600 mg Oral BID AC  . omega-3 acid ethyl esters  4 g Oral QHS  . [START ON 07/28/2016] pneumococcal 23 valent vaccine  0.5 mL Intramuscular Tomorrow-1000  . pregabalin  300 mg Oral QHS  . sertraline  100 mg Oral Daily  . sodium chloride flush  3 mL Intravenous Q12H  . tamsulosin  0.8 mg Oral Daily  . traZODone  100 mg Oral QHS     Social history Social History  Substance Use Topics  . Smoking  status: Never Smoker  . Smokeless tobacco: Never Used  . Alcohol use No     Comment: once month     Family history Family History  Problem Relation Age of Onset  . Adopted: Yes  . Hypertension Mother   . Hyperlipidemia Mother   . Hypertension Father   . Hyperlipidemia Father     Review of systems CV: no chest pain or palpitations PULM: no cough or shortness of breath   Physical Exam BP (!) 130/94   Pulse 73   Temp 98.3 F (36.8 C)   Resp 12   Ht 5\' 11"  (1.803 m)   Wt 110.2 kg (242 lb 15.2 oz)   SpO2 98%   BMI 33.88 kg/m   GEN: white male in NAD. HEENT: No proptosis, lid lag or stare. Oropharynx is clear.  NECK: supple, trachea midline.   RESPIRATORY: clear bilaterally, no wheeze, good inspiratory effort. CV: No carotid bruits, RRR. MUSCULOSKELETAL: no peripheral edema, no clubbing, no tremor ABD: soft, +epigastric TTP, decreased BS SKIN: +few xanthoma in the arms, unchanged LYMPH: no submandibular or supraclavicular LAD NEURO: PERRL, EOMI PSYC: alert and oriented, good insight  Labs Results for orders placed or performed during the hospital encounter of 07/24/16 (from the past 24 hour(s))  Glucose, capillary  Status: None   Collection Time: 07/26/16 10:03 AM  Result Value Ref Range   Glucose-Capillary 93 65 - 99 mg/dL  Glucose, capillary     Status: None   Collection Time: 07/26/16 11:04 AM  Result Value Ref Range   Glucose-Capillary 94 65 - 99 mg/dL  Basic metabolic panel     Status: Abnormal   Collection Time: 07/26/16 12:23 PM  Result Value Ref Range   Sodium 128 (L) 135 - 145 mmol/L   Potassium 4.2 3.5 - 5.1 mmol/L   Chloride 93 (L) 101 - 111 mmol/L   CO2 25 22 - 32 mmol/L   Glucose, Bld 99 65 - 99 mg/dL   BUN 6 6 - 20 mg/dL   Creatinine, Ser 0.68 0.61 - 1.24 mg/dL   Calcium 8.5 (L) 8.9 - 10.3 mg/dL   GFR calc non Af Amer >60 >60 mL/min   GFR calc Af Amer >60 >60 mL/min   Anion gap 10 5 - 15  Magnesium     Status: Abnormal   Collection Time:  07/26/16 12:23 PM  Result Value Ref Range   Magnesium 2.9 (H) 1.7 - 2.4 mg/dL  Phosphorus     Status: None   Collection Time: 07/26/16 12:23 PM  Result Value Ref Range   Phosphorus 4.5 2.5 - 4.6 mg/dL  Glucose, capillary     Status: None   Collection Time: 07/26/16 12:31 PM  Result Value Ref Range   Glucose-Capillary 96 65 - 99 mg/dL  Glucose, capillary     Status: Abnormal   Collection Time: 07/26/16  2:04 PM  Result Value Ref Range   Glucose-Capillary 118 (H) 65 - 99 mg/dL  Glucose, capillary     Status: None   Collection Time: 07/26/16  5:02 PM  Result Value Ref Range   Glucose-Capillary 95 65 - 99 mg/dL  Glucose, capillary     Status: None   Collection Time: 07/26/16  6:01 PM  Result Value Ref Range   Glucose-Capillary 99 65 - 99 mg/dL  Glucose, capillary     Status: None   Collection Time: 07/26/16  7:26 PM  Result Value Ref Range   Glucose-Capillary 92 65 - 99 mg/dL  Glucose, capillary     Status: None   Collection Time: 07/26/16  8:43 PM  Result Value Ref Range   Glucose-Capillary 97 65 - 99 mg/dL  Glucose, capillary     Status: None   Collection Time: 07/26/16  9:37 PM  Result Value Ref Range   Glucose-Capillary 87 65 - 99 mg/dL  Glucose, capillary     Status: None   Collection Time: 07/26/16 10:40 PM  Result Value Ref Range   Glucose-Capillary 87 65 - 99 mg/dL  Glucose, capillary     Status: None   Collection Time: 07/26/16 11:33 PM  Result Value Ref Range   Glucose-Capillary 99 65 - 99 mg/dL  Glucose, capillary     Status: None   Collection Time: 07/27/16 12:37 AM  Result Value Ref Range   Glucose-Capillary 97 65 - 99 mg/dL  Glucose, capillary     Status: None   Collection Time: 07/27/16  1:35 AM  Result Value Ref Range   Glucose-Capillary 91 65 - 99 mg/dL  Glucose, capillary     Status: None   Collection Time: 07/27/16  2:39 AM  Result Value Ref Range   Glucose-Capillary 84 65 - 99 mg/dL  Glucose, capillary     Status: None   Collection Time:  07/27/16  4:31 AM  Result Value Ref Range   Glucose-Capillary 81 65 - 99 mg/dL  Basic metabolic panel     Status: Abnormal   Collection Time: 07/27/16  4:42 AM  Result Value Ref Range   Sodium UNABLE TO REPORT DUE TO LIPEMIC INTERFERENCE 135 - 145 mmol/L   Potassium UNABLE TO REPORT DUE TO LIPEMIC INTERFERENCE 3.5 - 5.1 mmol/L   Chloride 97 (L) 101 - 111 mmol/L   CO2 28 22 - 32 mmol/L   Glucose, Bld 77 65 - 99 mg/dL   BUN <5 (L) 6 - 20 mg/dL   Creatinine, Ser 0.98 0.61 - 1.24 mg/dL   Calcium 8.6 (L) 8.9 - 10.3 mg/dL   GFR calc non Af Amer >60 >60 mL/min   GFR calc Af Amer >60 >60 mL/min   Anion gap UNABLE TO REPORT DUE TO LIPEMIC INTERFERENCE 5 - 15  Triglycerides     Status: None   Collection Time: 07/27/16  4:42 AM  Result Value Ref Range   Triglycerides UNABLE TO REPORT DUE TO LIPEMIC INTERFERENCE <150 mg/dL  Glucose, capillary     Status: None   Collection Time: 07/27/16  5:33 AM  Result Value Ref Range   Glucose-Capillary 79 65 - 99 mg/dL  Glucose, capillary     Status: None   Collection Time: 07/27/16  6:29 AM  Result Value Ref Range   Glucose-Capillary 68 65 - 99 mg/dL  Glucose, capillary     Status: None   Collection Time: 07/27/16  7:26 AM  Result Value Ref Range   Glucose-Capillary 94 65 - 99 mg/dL  Glucose, capillary     Status: None   Collection Time: 07/27/16  8:25 AM  Result Value Ref Range   Glucose-Capillary 82 65 - 99 mg/dL  Glucose, capillary     Status: Abnormal   Collection Time: 07/27/16  9:28 AM  Result Value Ref Range   Glucose-Capillary 62 (L) 65 - 99 mg/dL     Assessment 1.Acute pancreatitis due to hypertriglyceridemia. 2.Familial hypertriglyceridemia 3.Type 2 diabetes    Plan 1.   Continue IVF and IV insulin. Recommend that IV insulin stay ay 12 units/hr and dose of D10 be adjusted to keep sugars in the 80-150 range. Awaiting morning potassium level before increasing IVF which contains 40 mEq KCl.  2.   Will provide popsicle today.  Only available popsicles at [facility] are full sugar. Asked patient to se eif wife can bring in sugar-free popsicles in future.  3.   Trend triglycerides daily and continue IV insulin until serum Tg level <1000. 4.   Continue fibrate, and fish oil for now. Resume statin once he improves clinically. 5.   He will need to restart insulin pump once ready to transition off the IV insulin. He will need to have family bring in the necessary supplies. Prior trend has been 4-5 days before his Tg level is <1000.

## 2016-07-27 NOTE — Progress Notes (Signed)
[facility] [place] NAME: Sergio [name]    MR#:  [id number]  DATE OF BIRTH:  [birth year]  SUBJECTIVE:  CHIEF COMPLAINT:   Chief Complaint  Patient presents with  . Abdominal Pain    Came with hypertriglyceridemia, still TG > 5000.   Had c/o double vision this morning, no other complains of numbness or weakness.  he have urinary retention today.  REVIEW OF SYSTEMS:  CONSTITUTIONAL: No fever, fatigue or weakness.  EYES: No blurred or double vision.  EARS, NOSE, AND THROAT: No tinnitus or ear pain.  RESPIRATORY: No cough, shortness of breath, wheezing or hemoptysis.  CARDIOVASCULAR: No chest pain, orthopnea, edema.  GASTROINTESTINAL: No nausea, vomiting, diarrhea or abdominal pain.  GENITOURINARY: No dysuria, hematuria.  ENDOCRINE: No polyuria, nocturia,  HEMATOLOGY: No anemia, easy bruising or bleeding SKIN: No rash or lesion. MUSCULOSKELETAL: No joint pain or arthritis.   NEUROLOGIC: No tingling, numbness, weakness.  PSYCHIATRY: No anxiety or depression.   ROS  DRUG ALLERGIES:   Allergies  Allergen Reactions  . Codeine Anaphylaxis  . Ivp Dye [Iodinated Diagnostic Agents] Other (See Comments)    Kidneys stop working    VITALS:  Blood pressure 133/88, pulse 79, temperature 98.3 F (36.8 C), resp. rate 12, height 5\' 11"  (1.803 m), weight 110.2 kg (242 lb 15.2 oz), SpO2 95 %.  PHYSICAL EXAMINATION:  GENERAL:  40 y.o.-year-old patient lying in the bed with no acute distress.  EYES: Pupils equal, round, reactive to light and accommodation. No scleral icterus. Extraocular muscles intact.  HEENT: Head atraumatic, normocephalic. Oropharynx and nasopharynx clear.  NECK:  Supple, no jugular venous distention. No thyroid enlargement, no tenderness.  LUNGS: Normal breath sounds bilaterally, no wheezing, rales,rhonchi or crepitation. No use of accessory muscles of respiration.  CARDIOVASCULAR: S1, S2 normal. No murmurs, rubs, or gallops.   ABDOMEN: Soft,  Mild tender, nondistended. Bowel sounds present. No organomegaly or mass.  EXTREMITIES: No pedal edema, cyanosis, or clubbing.  NEUROLOGIC: Cranial nerves II through XII are intact. Muscle strength 5/5 in all extremities. Sensation intact. Gait not checked.  PSYCHIATRIC: The patient is alert and oriented x 3.  SKIN: No obvious rash, lesion, or ulcer.   Physical Exam LABORATORY PANEL:   CBC  Recent Labs Lab 07/25/16 0452  WBC 5.7  HGB 13.0  HCT 37.9*  PLT 217   ------------------------------------------------------------------------------------------------------------------  Chemistries   Recent Labs Lab 07/25/16 0452  07/27/16 1005  NA 138  < > 135  K 3.5  < > 5.3*  CL 105  < > 100*  CO2 24  < > 26  GLUCOSE 133*  < > 141*  BUN 10  < > <5*  CREATININE 0.66  < > 1.23  CALCIUM 8.6*  < > 8.5*  MG  --   < > 1.9  AST 18  --   --   ALT 19  --   --   ALKPHOS 50  --   --   BILITOT 1.0  --   --   < > = values in this interval not displayed. ------------------------------------------------------------------------------------------------------------------  Cardiac Enzymes No results for input(s): TROPONINI in the last 168 hours. ------------------------------------------------------------------------------------------------------------------  RADIOLOGY:  Ct Head Wo Contrast  Result Date: 07/26/2016 CLINICAL DATA:  Double vision this morning. EXAM: CT HEAD WITHOUT CONTRAST TECHNIQUE: Contiguous axial images were obtained from the base of the skull through the vertex without intravenous contrast. COMPARISON:  None. FINDINGS: Brain: No evidence for acute hemorrhage, mass lesion,  midline shift, hydrocephalus or large infarct. Vascular: No hyperdense vessel or unexpected calcification. Skull: Normal. Negative for fracture or focal lesion. Sinuses/Orbits: Mild mucosal disease in the ethmoid air cells. Other: None. IMPRESSION: No acute intracranial abnormality.  Electronically Signed   By: Markus Daft M.D.   On: 07/26/2016 18:22   [name] Contrast  Result Date: 07/27/2016 CLINICAL DATA:  Double vision. EXAM: MRI HEAD WITHOUT CONTRAST TECHNIQUE: Multiplanar, multiecho pulse sequences of the brain and surrounding structures were obtained without intravenous contrast. COMPARISON:  Head CT 07/26/2016 FINDINGS: Brain: No acute infarct or intraparenchymal hemorrhage. Specifically, there is no signal abnormality in the areas of the nuclei of the third, fourth or sixth cranial nerves or in the region of the medial longitudinal fasciculus and paramedian pontine reticular formation. Non-fat-suppressed images of the optic nerves are normal. There is a partially empty sella. The midline structures are otherwise normal. No focal parenchymal signal abnormality. No mass lesion or midline shift. No hydrocephalus or extra-axial fluid collection. Vascular: Major intracranial arterial and venous sinus flow voids are preserved. No evidence of chronic microhemorrhage or amyloid angiopathy. Skull and upper cervical spine: The visualized skull base, calvarium, upper cervical spine and extracranial soft tissues are normal. Sinuses/Orbits: No fluid levels or advanced mucosal thickening. No mastoid effusion. Normal orbits. IMPRESSION: Normal brain MR. No findings to explain the reported diplopia. Electronically Signed   By: Ulyses Jarred M.D.   On: 07/27/2016 14:11    ASSESSMENT AND PLAN:   Active Problems:   Hypertriglyceridemia   Chronic pancreatitis (HCC)   Hyponatremia   Hypokalemia  #1, hypertriglyceridemia,    insulin IV drip, follow triglycerides daily, continue Lopid and omega-3 fatty acids,    Appreciated Dr.Solum's help to manage hypertriglyceridemia.   On D10 drip + high dose insulin drip. #2 chronic pancreatitis exacerbation, supportive therapy with IV fluids, pain medications #3. Hyponatremia, IV fluids with normal saline, follow sodium level in the morning #4.  Hypokalemia, supplement intravenously, check magnesium level, supplemented as needed   Now added K to IV fluids. #5. BPH, advance patient's finasteride and Flomax, per outpatient urologist recommendations    He still had retention today, and > 500 ml urine on bladder scan - called urology consult. #6. Diabetes mellitus, continue insulin IV drip for now, endocrinologist consultation is appreciated,     Need to start back on insulin pump once TG is under control. #7 Double vision   CT head negative, Did MRI brain- negative- called neurology consult.  Due to requirement of frequent blood sugar monitoring and insulin drip , he is in step down unit, may transfer to floor - if possible to provide this care.  All the records are reviewed and case discussed with Care Management/Social Workerr. Management plans discussed with the patient, family and they are in agreement.  CODE STATUS: full  TOTAL TIME TAKING CARE OF THIS PATIENT: 35 minutes.    POSSIBLE D/C IN 2-3 DAYS, DEPENDING ON CLINICAL CONDITION.   Vaughan Basta M.D on 07/27/2016   Between 7am to 6pm - Pager - [phone]  After 6pm go to [email or web address] - password [email or web address] [facility]  Office  [phone]  CC: Primary care physician; Crecencio Mc, MD  Note: This dictation was prepared with Dragon dictation along with smaller phrase technology. Any transcriptional errors that result from this process are unintentional.

## 2016-07-27 NOTE — Progress Notes (Signed)
Informed about a consult by unit clerk afternoon for urinary retention. The patient has a known history of BPH and is on maximal medical therapy managed by his outpatient urologist Dr. Jacqlyn Larsen.  Per his nurse today, he has a Foley catheter in place and does have a history of urinary retention when immobilized and on narcotics.  I paged Dr. Anselm Jungling to discuss this case further as reason for consult is unclear given the patient is being appropriately managed and has outpatient follow-up.  I will defer seeing this consult until I hear back.    Hollice Espy, MD

## 2016-07-27 NOTE — Plan of Care (Signed)
Problem: Physical Regulation: Goal: Ability to maintain clinical measurements within normal limits will improve Outcome: Progressing Blood sugars improved with increase of D10 infusion rate (currently D10 with normal saline ordered and infusing) and popsicles allowed by endocrinologist. Foley catherer had to be inserted due to urinary retention despite increases in patient's flomax and proscar. Plan of care discussed with RN stated that patient may have to have catheter until after discharge and have it removed outpatient.

## 2016-07-27 NOTE — Consult Note (Signed)
Reason for Consult:Diplopia Referring Physician: Anselm Jungling  CC: Diplopia  HPI: Sergio [name] is an 40 y.o. male with a history of DM with an insulin pump, admitted with abdominal pain, who reports that on yesterday he awakened with diplopia.  Describes double vision that is horizontal.  It is worse on looking to the left.  Hr describes no extremity symptoms.    Past Medical History:  Diagnosis Date  . Anxiety   . Chicken pox   . Depression   . Diabetes mellitus   . Familial hypertriglyceridemia    severe  . Gastric ulcer 2009  . HTN (hypertension)   . Morbid obesity ([place])    s/p bariatric sleeve surgery 01/2015  . Recurrent acute pancreatitis    secondary to hypertriglyceridemia   . Sleep apnea 1999   uses CPAP    Past Surgical History:  Procedure Laterality Date  . CHOLECYSTECTOMY    . LAPAROSCOPIC GASTRIC SLEEVE RESECTION    . [place] SURGERY  2008  . TONSILLECTOMY  2003  . VASECTOMY      Family History  Problem Relation Age of Onset  . Adopted: Yes  . Hypertension Mother   . Hyperlipidemia Mother   . Hypertension Father   . Hyperlipidemia Father     Social History:  reports that he has never smoked. He has never used smokeless tobacco. He reports that he does not drink alcohol or use drugs.  Allergies  Allergen Reactions  . Codeine Anaphylaxis  . Ivp Dye [Iodinated Diagnostic Agents] Other (See Comments)    Kidneys stop working    Medications:  I have reviewed the patient's current medications. Prior to Admission:  Prescriptions Prior to Admission  Medication Sig Dispense Refill Last Dose  . clonazePAM (KLONOPIN) 0.5 MG tablet Take 1 tablet (0.5 mg total) by mouth daily. In the afternoon for anxiety 30 tablet 1 07/23/2016 at prn  . finasteride (PROSCAR) 5 MG tablet Take 1 tablet (5 mg total) by mouth daily. 30 tablet 0 07/23/2016 at Unknown time  . gemfibrozil (LOPID) 600 MG tablet Take 1 tablet (600 mg total) by mouth 2 (two) times daily before a  meal. 60 tablet 0 07/23/2016 at Unknown time  . insulin aspart (NOVOLOG) 100 UNIT/ML injection Inject 1 Dose into the skin See admin instructions. Use up to 120 units daily via insulin pump Humalog   07/24/2016 at Unknown time  . omega-3 acid ethyl esters (LOVAZA) 1 g capsule Take 4 g by mouth at bedtime.   Past Week at Unknown time  . pregabalin (LYRICA) 300 MG capsule Take 300 mg by mouth at bedtime.    Past Week at Unknown time  . sertraline (ZOLOFT) 100 MG tablet Take 1.5 tablets (150 mg total) by mouth daily. (Patient taking differently: Take 100 mg by mouth daily. ) 60 tablet 0 07/23/2016 at Unknown time  . tamsulosin (FLOMAX) 0.4 MG CAPS capsule Take 1 capsule (0.4 mg total) by mouth daily. 30 capsule 0 Past Week at Unknown time  . traZODone (DESYREL) 100 MG tablet Take 100 mg by mouth at bedtime.   Past Week at Unknown time   Scheduled: . docusate sodium  100 mg Oral BID  . enoxaparin (LOVENOX) injection  40 mg Subcutaneous Q24H  . finasteride  10 mg Oral Daily  . gemfibrozil  600 mg Oral BID AC  . omega-3 acid ethyl esters  4 g Oral QHS  . [START ON 07/28/2016] pneumococcal 23 valent vaccine  0.5 mL Intramuscular Tomorrow-1000  .  pregabalin  300 mg Oral QHS  . sertraline  100 mg Oral Daily  . sodium chloride flush  3 mL Intravenous Q12H  . tamsulosin  0.8 mg Oral Daily  . traZODone  100 mg Oral QHS    ROS: History obtained from the patient  General ROS: negative for - chills, fatigue, fever, night sweats, weight gain or weight loss Psychological ROS: negative for - behavioral disorder, hallucinations, memory difficulties, mood swings or suicidal ideation Ophthalmic ROS: negative for - blurry vision, eye pain or loss of vision ENT ROS: negative for - epistaxis, nasal discharge, oral lesions, sore throat, tinnitus or vertigo Allergy and Immunology ROS: negative for - hives or itchy/watery eyes Hematological and Lymphatic ROS: negative for - bleeding problems, bruising or swollen  lymph nodes Endocrine ROS: negative for - galactorrhea, hair pattern changes, polydipsia/polyuria or temperature intolerance Respiratory ROS: negative for - cough, hemoptysis, shortness of breath or wheezing Cardiovascular ROS: negative for - chest pain, dyspnea on exertion, edema or irregular heartbeat Gastrointestinal ROS: abdominal pain Genito-Urinary ROS: negative for - dysuria, hematuria, incontinence or urinary frequency/urgency Musculoskeletal ROS: negative for - joint swelling or muscular weakness Neurological ROS: as noted in HPI Dermatological ROS: negative for rash and skin lesion changes  Physical Examination: Blood pressure 135/86, pulse 79, temperature 97.9 F (36.6 C), temperature source Oral, resp. rate 18, height 5' 11"  (1.803 m), weight 110.2 kg (242 lb 15.2 oz), SpO2 96 %.  HEENT-  Normocephalic, no lesions, without obvious abnormality.  Normal external eye and conjunctiva.  Normal TM's bilaterally.  Normal auditory canals and external ears. Normal external nose, mucus membranes and septum.  Normal pharynx. Cardiovascular- S1, S2 normal, pulses palpable throughout   Lungs- chest clear, no wheezing, rales, normal symmetric air entry Abdomen- soft, non-tender; bowel sounds normal; no masses,  no organomegaly Extremities- no edema Lymph-no adenopathy palpable Musculoskeletal-no joint tenderness, deformity or swelling Skin-warm and dry, no hyperpigmentation, vitiligo, or suspicious lesions  Neurological Examination Mental Status: Alert, oriented, thought content appropriate.  Speech fluent without evidence of aphasia.  Able to follow 3 step commands without difficulty. Cranial Nerves: II: Discs flat bilaterally; Visual fields grossly normal, pupils equal, round, reactive to light and accommodation III,IV, VI: ptosis not present, left sixth nerve palsy V,VII: smile symmetric, facial light touch sensation normal bilaterally VIII: hearing normal bilaterally IX,X: gag reflex  present XI: bilateral shoulder shrug XII: midline tongue extension Motor: Right : Upper extremity   5/5    Left:     Upper extremity   5/5  Lower extremity   5/5     Lower extremity   5/5 Tone and bulk:normal tone throughout; no atrophy noted Sensory: Pinprick and light touch intact throughout, bilaterally Deep Tendon Reflexes: 2+ and symmetric with absent AJ's bilaterally Plantars: Right: downgoing   Left: downgoing Cerebellar: Normal finger-to-nose and normal heel-to-shin testing bilaterally Gait: not tested due to safety concerns   Laboratory Studies:   Basic Metabolic Panel:  Recent Labs Lab 07/24/16 1333 07/24/16 1809 07/25/16 0452 07/26/16 1223 07/27/16 0442 07/27/16 1005  NA 126*  --  138 128* 137 135  K 3.3*  --  3.5 4.2 5.5* 5.3*  CL 97*  --  105 93* 97* 100*  CO2 23  --  24 25 28 26   GLUCOSE 148*  --  133* 99 77 141*  BUN 9  --  10 6 <5* <5*  CREATININE 0.41*  --  0.66 0.68 0.98 1.23  CALCIUM 7.9*  --  8.6* 8.5*  8.6* 8.5*  MG  --  5.1*  --  2.9*  --  1.9  PHOS  --   --   --  4.5  --  5.5*    Liver Function Tests:  Recent Labs Lab 07/24/16 1123 07/24/16 1333 07/25/16 0452  AST 21 19 18   ALT 22 19 19   ALKPHOS 71 46 50  BILITOT <0.1* <0.1* 1.0  PROT 5.1* 4.5* 7.4  ALBUMIN 4.9 4.2 4.1    Recent Labs Lab 07/24/16 1123  LIPASE 17   No results for input(s): AMMONIA in the last 168 hours.  CBC:  Recent Labs Lab 07/24/16 1123 07/25/16 0452  WBC 5.3 5.7  HGB UNABLE TO REPORT DUE TO LIPEMIC INTERFERENCE 13.0  HCT 40.7 37.9*  MCV 76.7* 79.3*  PLT 273 217    Cardiac Enzymes: No results for input(s): CKTOTAL, CKMB, CKMBINDEX, TROPONINI in the last 168 hours.  BNP: Invalid input(s): POCBNP  CBG:  Recent Labs Lab 07/27/16 0950 07/27/16 1105 07/27/16 1324 07/27/16 1419 07/27/16 1519  GLUCAP 114* 101* 67 116* 137*    Microbiology: Results for orders placed or performed during the hospital encounter of 07/24/16  MRSA PCR Screening      Status: None   Collection Time: 07/24/16 10:23 PM  Result Value Ref Range Status   MRSA by PCR NEGATIVE NEGATIVE Final    Comment:        The GeneXpert MRSA Assay (FDA approved for NASAL specimens only), is one component of a comprehensive MRSA colonization surveillance program. It is not intended to diagnose MRSA infection nor to guide or monitor treatment for MRSA infections.     Coagulation Studies: No results for input(s): LABPROT, INR in the last 72 hours.  Urinalysis:  Recent Labs Lab 07/24/16 1858  COLORURINE YELLOW*  LABSPEC 1.019  PHURINE 5.0  GLUCOSEU 50*  HGBUR NEGATIVE  BILIRUBINUR NEGATIVE  KETONESUR TRACE*  PROTEINUR 100*  NITRITE NEGATIVE  LEUKOCYTESUR NEGATIVE    Lipid Panel:     Component Value Date/Time   CHOL 899 (H) 03/10/2016 2346   CHOL 357 (H) 09/04/2014 0328   TRIG >5,000 (H) 07/27/2016 1005   TRIG 1,201 (H) 11/04/2014 0427   HDL 16 (L) 03/10/2016 2346   HDL SEE COMMENT 09/04/2014 0328   CHOLHDL 56.2 03/10/2016 2346   VLDL UNABLE TO CALCULATE IF TRIGLYCERIDE OVER 400 mg/dL 03/10/2016 2346   VLDL SEE COMMENT 09/04/2014 0328   LDLCALC UNABLE TO CALCULATE IF TRIGLYCERIDE OVER 400 mg/dL 03/10/2016 2346   LDLCALC SEE COMMENT 09/04/2014 0328    HgbA1C:  Lab Results  Component Value Date   HGBA1C 7.9 (H) 07/24/2016    Urine Drug Screen:  No results found for: LABOPIA, COCAINSCRNUR, LABBENZ, AMPHETMU, THCU, LABBARB  Alcohol Level: No results for input(s): ETH in the last 168 hours.   Imaging: Ct Head Wo Contrast  Result Date: 07/26/2016 CLINICAL DATA:  Double vision this morning. EXAM: CT HEAD WITHOUT CONTRAST TECHNIQUE: Contiguous axial images were obtained from the base of the skull through the vertex without intravenous contrast. COMPARISON:  None. FINDINGS: Brain: No evidence for acute hemorrhage, mass lesion, midline shift, hydrocephalus or large infarct. Vascular: No hyperdense vessel or unexpected calcification. Skull: Normal.  Negative for fracture or focal lesion. Sinuses/Orbits: Mild mucosal disease in the ethmoid air cells. Other: None. IMPRESSION: No acute intracranial abnormality. Electronically Signed   By: Markus Daft M.D.   On: 07/26/2016 18:22   [name] Contrast  Result Date: 07/27/2016 CLINICAL DATA:  Double  vision. EXAM: MRI HEAD WITHOUT CONTRAST TECHNIQUE: Multiplanar, multiecho pulse sequences of the brain and surrounding structures were obtained without intravenous contrast. COMPARISON:  Head CT 07/26/2016 FINDINGS: Brain: No acute infarct or intraparenchymal hemorrhage. Specifically, there is no signal abnormality in the areas of the nuclei of the third, fourth or sixth cranial nerves or in the region of the medial longitudinal fasciculus and paramedian pontine reticular formation. Non-fat-suppressed images of the optic nerves are normal. There is a partially empty sella. The midline structures are otherwise normal. No focal parenchymal signal abnormality. No mass lesion or midline shift. No hydrocephalus or extra-axial fluid collection. Vascular: Major intracranial arterial and venous sinus flow voids are preserved. No evidence of chronic microhemorrhage or amyloid angiopathy. Skull and upper cervical spine: The visualized skull base, calvarium, upper cervical spine and extracranial soft tissues are normal. Sinuses/Orbits: No fluid levels or advanced mucosal thickening. No mastoid effusion. Normal orbits. IMPRESSION: Normal brain MR. No findings to explain the reported diplopia. Electronically Signed   By: Ulyses Jarred M.D.   On: 07/27/2016 14:11     Assessment/Plan: 40 year old male with a history of DM who complains of acute onset diplopia.  Neurological examination significant for an isolated sixth nerve palsy.  Etiology likely secondary to poorly controlled DM (last A1c 7.9).  Will rule out other possibilities as well.  MRI of the brain personally reviewed and shows no acute changes.     Recommendations: 1.  MRA of the brain to rule out aneurysm 2.  Eye patch.  Would alternate eye daily. 3.  Myasthenia panel, Folate, ESR, CRP, B1, B12, lyme titer  Alexis Goodell, MD Neurology [phone] 07/27/2016, 3:25 PM

## 2016-07-27 NOTE — Progress Notes (Signed)
[facility] for Electrolyte Management Indication: Insulin Drip for hypertriglyceridemia   40 y/o M with a h/o familial hypertriglyceridemia, related pancreatitis, DM, HTN,  and obesity on insulin pump admitted with elevated TG. Patient currently ordered insulin drip at 12 units/hr and D10%/0.45NS at 75 mL/hr.  Plan:  10/17 1733 K = 3.9 (WNL). No supplementation needed at this time.  Will recheck electrolytes with AM labs tomorrow  Will continue to obtain electrolytes every twelve hours while patient is on insulin infusion.      Allergies  Allergen Reactions  . Codeine Anaphylaxis  . Ivp Dye [Iodinated Diagnostic Agents] Other (See Comments)    Kidneys stop working    Patient Measurements: Height: 5\' 11"  (180.3 cm) Weight: 242 lb 15.2 oz (110.2 kg) IBW/kg (Calculated) : 75.3  Vital Signs: Temp: 97.9 F (36.6 C) (10/17 1519) Temp Source: Oral (10/17 1519) BP: 136/89 (10/17 1810) Pulse Rate: 71 (10/17 1810) Intake/Output from previous day: 10/16 0701 - 10/17 0700 In: 2152.4 [I.V.:2052.4] Out: 1550 [id number] Intake/Output from this shift: No intake/output data recorded.  Labs:  Recent Labs  07/25/16 0452 07/26/16 1223 07/27/16 0442 07/27/16 1005 07/27/16 1733  WBC 5.7  --   --   --   --   HGB 13.0  --   --   --   --   HCT 37.9*  --   --   --   --   PLT 217  --   --   --   --   CREATININE 0.66 0.68 0.98 1.23 0.78  MG  --  2.9*  --  1.9  --   PHOS  --  4.5  --  5.5*  --   ALBUMIN 4.1  --   --   --   --   PROT 7.4  --   --   --   --   AST 18  --   --   --   --   ALT 19  --   --   --   --   ALKPHOS 50  --   --   --   --   BILITOT 1.0  --   --   --   --    Estimated Creatinine Clearance: 158.1 mL/min (by C-G formula based on SCr of 0.78 mg/dL).   Microbiology: Recent Results (from the past 720 hour(s))  MRSA PCR Screening     Status: None   Collection Time: 07/24/16 10:23 PM  Result Value Ref Range Status   MRSA  by PCR NEGATIVE NEGATIVE Final    Comment:        The GeneXpert MRSA Assay (FDA approved for NASAL specimens only), is one component of a comprehensive MRSA colonization surveillance program. It is not intended to diagnose MRSA infection nor to guide or monitor treatment for MRSA infections.     Medical History: Past Medical History:  Diagnosis Date  . Anxiety   . Chicken pox   . Depression   . Diabetes mellitus   . Familial hypertriglyceridemia    severe  . Gastric ulcer 2009  . HTN (hypertension)   . Morbid obesity ([employer] [facility])    s/p bariatric sleeve surgery 01/2015  . Recurrent acute pancreatitis    secondary to hypertriglyceridemia   . Sleep apnea 1999   uses CPAP   Pharmacy will continue to monitor and adjust per consult.    Lenis Noon, PharmD Clinical Pharmacist 07/27/2016,7:41 PM

## 2016-07-27 NOTE — Progress Notes (Signed)
Spoke to Dr. Anselm Jungling on the phone about MRI and MD ordered that patient travel to and from MRI without an RN present and that his infusions (including insulin and d10 solutions) could be paused and disconnected for MRI procedure.

## 2016-07-27 NOTE — Progress Notes (Signed)
[facility] for Electrolyte Management Indication: Insulin Drip for hypertriglyceridemia   40 y/o M with a h/o familial hypertriglyceridemia, related pancreatitis, DM, HTN,  and obesity on insulin pump admitted with elevated TG. Patient currently ordered insulin drip at 12 units/hr and D10%/0.45NS with 14mEq of Potassium at 51mL/hr. Patient had two episodes of hyperglycemia.   Plan:  Potassium is elevated. Will change fluids to D10%/0.45NS at 121mL/hr. Will obtain follow-up BMP at 1800. Will continue to adjust fluids to maintain BG 80-150. Will continue to obtain electrolytes every twelve hours while patient is on insulin infusion.      Allergies  Allergen Reactions  . Codeine Anaphylaxis  . Ivp Dye [Iodinated Diagnostic Agents] Other (See Comments)    Kidneys stop working    Patient Measurements: Height: 5\' 11"  (180.3 cm) Weight: 242 lb 15.2 oz (110.2 kg) IBW/kg (Calculated) : 75.3  Vital Signs: BP: 133/88 (10/17 1100) Pulse Rate: 79 (10/17 1100) Intake/Output from previous day: 10/16 0701 - 10/17 0700 In: 2152.4 [I.V.:2052.4] Out: 1550 [id number] Intake/Output from this shift: Total I/O In: 174 [I.V.:174] Out: -   Labs:  Recent Labs  07/24/16 1333 07/24/16 1809 07/25/16 0452 07/26/16 1223 07/27/16 0442 07/27/16 1005  WBC  --   --  5.7  --   --   --   HGB  --   --  13.0  --   --   --   HCT  --   --  37.9*  --   --   --   PLT  --   --  217  --   --   --   CREATININE 0.41*  --  0.66 0.68 0.98 1.23  MG  --  5.1*  --  2.9*  --  1.9  PHOS  --   --   --  4.5  --  5.5*  ALBUMIN 4.2  --  4.1  --   --   --   PROT 4.5*  --  7.4  --   --   --   AST 19  --  18  --   --   --   ALT 19  --  19  --   --   --   ALKPHOS 46  --  50  --   --   --   BILITOT <0.1*  --  1.0  --   --   --    Estimated Creatinine Clearance: 102.9 mL/min (by C-G formula based on SCr of 1.23 mg/dL).   Microbiology: Recent Results (from the past 720  hour(s))  MRSA PCR Screening     Status: None   Collection Time: 07/24/16 10:23 PM  Result Value Ref Range Status   MRSA by PCR NEGATIVE NEGATIVE Final    Comment:        The GeneXpert MRSA Assay (FDA approved for NASAL specimens only), is one component of a comprehensive MRSA colonization surveillance program. It is not intended to diagnose MRSA infection nor to guide or monitor treatment for MRSA infections.     Medical History: Past Medical History:  Diagnosis Date  . Anxiety   . Chicken pox   . Depression   . Diabetes mellitus   . Familial hypertriglyceridemia    severe  . Gastric ulcer 2009  . HTN (hypertension)   . Morbid obesity ([place])    s/p bariatric sleeve surgery 01/2015  . Recurrent acute pancreatitis    secondary to hypertriglyceridemia   .  Sleep apnea 1999   uses CPAP   Pharmacy will continue to monitor and adjust per consult.    Simpson,Michael L 07/27/2016,1:00 PM

## 2016-07-27 NOTE — Progress Notes (Signed)
Inpatient Diabetes Program Recommendations  AACE/ADA: New Consensus Statement on Inpatient Glycemic Control (2015)  Target Ranges:  Prepandial:   less than 140 mg/dL      Peak postprandial:   less than 180 mg/dL (1-2 hours)      Critically ill patients:  140 - 180 mg/dL   Lab Results  Component Value Date   GLUCAP 87 07/27/2016   HGBA1C 7.9 (H) 07/24/2016    Review of Glycemic Control  Results for ISMAR, PUZON (MRN [id number]) as of 07/27/2016 14:06  Ref. Range 07/27/2016 08:25 07/27/2016 09:28 07/27/2016 09:50 07/27/2016 11:05 07/27/2016 13:24  Glucose-Capillary Latest Ref Range: 65 - 99 mg/dL 82 62 (L) 114 (H) 101 (H) 87   Diabetes history: Type 2 Outpatient Diabetes medications: Humalog via insulin pump Current orders for Inpatient glycemic control: IV insulin Sodium Chloride with R insulin 12 ml/hour  Inpatient Diabetes Program Recommendations:  Followed by Dr. Gabriel Carina. IV insulin 250 ml normal saline with regular insulin 250 units via infusion at 12 units per hour.   Gentry Fitz, RN, BA, MHA, CDE Diabetes Coordinator Inpatient Diabetes Program  [phone] (Team Pager) [phone] ([place]) 07/27/2016 2:43 PM

## 2016-07-28 DIAGNOSIS — H4922 Sixth [abducent] nerve palsy, left eye: Secondary | ICD-10-CM

## 2016-07-28 LAB — BASIC METABOLIC PANEL
Anion gap: 11 (ref 5–15)
Anion gap: 11 (ref 5–15)
CALCIUM: 8.6 mg/dL — AB (ref 8.9–10.3)
CHLORIDE: 99 mmol/L — AB (ref 101–111)
CHLORIDE: 103 mmol/L (ref 101–111)
CO2: 25 mmol/L (ref 22–32)
CO2: 23 mmol/L (ref 22–32)
CREATININE: 0.83 mg/dL (ref 0.61–1.24)
CREATININE: 0.75 mg/dL (ref 0.61–1.24)
Calcium: 8.7 mg/dL — ABNORMAL LOW (ref 8.9–10.3)
GFR calc Af Amer: >60 mL/min (ref >60)
GFR calc Af Amer: >60 mL/min (ref >60)
GFR calc non Af Amer: >60 mL/min (ref >60)
GFR calc non Af Amer: >60 mL/min (ref >60)
Glucose, Bld: 82 mg/dL (ref 65–99)
Glucose, Bld: 113 mg/dL — ABNORMAL HIGH (ref 65–99)
Potassium: 3.9 mmol/L (ref 3.5–5.1)
Potassium: 3.7 mmol/L (ref 3.5–5.1)
SODIUM: 137 mmol/L (ref 135–145)
Sodium: 135 mmol/L (ref 135–145)

## 2016-07-28 LAB — GLUCOSE, CAPILLARY
GLUCOSE-CAPILLARY: 116 mg/dL — AB (ref 65–99)
GLUCOSE-CAPILLARY: 97 mg/dL (ref 65–99)
GLUCOSE-CAPILLARY: 91 mg/dL (ref 65–99)
GLUCOSE-CAPILLARY: 87 mg/dL (ref 65–99)
GLUCOSE-CAPILLARY: 92 mg/dL (ref 65–99)
GLUCOSE-CAPILLARY: 85 mg/dL (ref 65–99)
GLUCOSE-CAPILLARY: 117 mg/dL — AB (ref 65–99)
GLUCOSE-CAPILLARY: 126 mg/dL — AB (ref 65–99)
GLUCOSE-CAPILLARY: 99 mg/dL (ref 65–99)
Glucose-Capillary: 111 mg/dL — ABNORMAL HIGH (ref 65–99)
Glucose-Capillary: 65 mg/dL (ref 65–99)
Glucose-Capillary: 67 mg/dL (ref 65–99)
Glucose-Capillary: 68 mg/dL (ref 65–99)
Glucose-Capillary: 73 mg/dL (ref 65–99)
Glucose-Capillary: 86 mg/dL (ref 65–99)
Glucose-Capillary: 94 mg/dL (ref 65–99)
Glucose-Capillary: 94 mg/dL (ref 65–99)

## 2016-07-28 LAB — MAGNESIUM: MAGNESIUM: 1.8 mg/dL (ref 1.7–2.4)

## 2016-07-28 LAB — B. BURGDORFI ANTIBODIES

## 2016-07-28 LAB — TRIGLYCERIDES: Triglycerides: 4097 mg/dL — ABNORMAL HIGH (ref <150)

## 2016-07-28 NOTE — Progress Notes (Signed)
[facility] [place] NAME: Sergio [name]    MR#:  [id number]  DATE OF BIRTH:  Jun 07, 1976  SUBJECTIVE:  CHIEF COMPLAINT:   Chief Complaint  Patient presents with  . Abdominal Pain    Came with hypertriglyceridemia, TG > 5000.   Had c/o double vision , no other complains of numbness or weakness.  he have urinary retention, foley placed.   MRI brain and MRA are negative.  REVIEW OF SYSTEMS:  CONSTITUTIONAL: No fever, fatigue or weakness.  EYES: No blurred or double vision.  EARS, NOSE, AND THROAT: No tinnitus or ear pain.  RESPIRATORY: No cough, shortness of breath, wheezing or hemoptysis.  CARDIOVASCULAR: No chest pain, orthopnea, edema.  GASTROINTESTINAL: No nausea, vomiting, diarrhea or abdominal pain.  GENITOURINARY: No dysuria, hematuria.  ENDOCRINE: No polyuria, nocturia,  HEMATOLOGY: No anemia, easy bruising or bleeding SKIN: No rash or lesion. MUSCULOSKELETAL: No joint pain or arthritis.   NEUROLOGIC: No tingling, numbness, weakness.  PSYCHIATRY: No anxiety or depression.   ROS  DRUG ALLERGIES:   Allergies  Allergen Reactions  . Codeine Anaphylaxis  . Ivp Dye [Iodinated Diagnostic Agents] Other (See Comments)    Kidneys stop working    VITALS:  Blood pressure 125/83, pulse 68, temperature 98.6 F (37 C), temperature source Oral, resp. rate (!) 6, height 5\' 11"  (1.803 m), weight 110.2 kg (242 lb 15.2 oz), SpO2 94 %.  PHYSICAL EXAMINATION:  GENERAL:  40 y.o.-year-old patient lying in the bed with no acute distress.  EYES: Pupils equal, round, reactive to light and accommodation. No scleral icterus. Extraocular muscles intact.  HEENT: Head atraumatic, normocephalic. Oropharynx and nasopharynx clear.  NECK:  Supple, no jugular venous distention. No thyroid enlargement, no tenderness.  LUNGS: Normal breath sounds bilaterally, no wheezing, rales,rhonchi or crepitation. No use of accessory muscles of respiration.   CARDIOVASCULAR: S1, S2 normal. No murmurs, rubs, or gallops.  ABDOMEN: Soft,  Mild tender, nondistended. Bowel sounds present. No organomegaly or mass.  EXTREMITIES: No pedal edema, cyanosis, or clubbing.  NEUROLOGIC: Cranial nerves II through XII are intact. Muscle strength 5/5 in all extremities. Sensation intact. Gait not checked.  PSYCHIATRIC: The patient is alert and oriented x 3.  SKIN: No obvious rash, lesion, or ulcer.   Physical Exam LABORATORY PANEL:   CBC  Recent Labs Lab 07/25/16 0452  WBC 5.7  HGB 13.0  HCT 37.9*  PLT 217   ------------------------------------------------------------------------------------------------------------------  Chemistries   Recent Labs Lab 07/25/16 0452  07/28/16 0333  NA 138  < > 135  K 3.5  < > 3.9  CL 105  < > 99*  CO2 24  < > 25  GLUCOSE 133*  < > 82  BUN 10  < > <5*  CREATININE 0.66  < > 0.83  CALCIUM 8.6*  < > 8.6*  MG  --   < > 1.8  AST 18  --   --   ALT 19  --   --   ALKPHOS 50  --   --   BILITOT 1.0  --   --   < > = values in this interval not displayed. ------------------------------------------------------------------------------------------------------------------  Cardiac Enzymes No results for input(s): TROPONINI in the last 168 hours. ------------------------------------------------------------------------------------------------------------------  RADIOLOGY:  Ct Head Wo Contrast  Result Date: 07/26/2016 CLINICAL DATA:  Double vision this morning. EXAM: CT HEAD WITHOUT CONTRAST TECHNIQUE: Contiguous axial images were obtained from the base of the skull through the vertex without intravenous contrast. COMPARISON:  None. FINDINGS: Brain: No evidence for acute hemorrhage, mass lesion, midline shift, hydrocephalus or large infarct. Vascular: No hyperdense vessel or unexpected calcification. Skull: Normal. Negative for fracture or focal lesion. Sinuses/Orbits: Mild mucosal disease in the ethmoid air cells. Other:  None. IMPRESSION: No acute intracranial abnormality. Electronically Signed   By: Markus Daft M.D.   On: 07/26/2016 18:22   [name] Head Wo Contrast  Result Date: 07/27/2016 CLINICAL DATA:  Diplopia.  Left sixth nerve palsy. EXAM: MRA HEAD WITHOUT CONTRAST TECHNIQUE: Angiographic images of the Circle of Willis were obtained using MRA technique without intravenous contrast. COMPARISON:  None. FINDINGS: The visualized distal vertebral arteries are patent and codominant. PICA origins were not imaged. Left AICA is dominant. SCA origins are patent. Basilar artery is widely patent. Posterior communicating arteries are not identified. PCAs are patent without evidence of significant stenosis. The internal carotid arteries are widely patent from skullbase to carotid termini. ACAs and MCAs are patent without evidence of major branch occlusion or significant proximal stenosis. No aneurysm is identified. IMPRESSION: Negative head MRA. Electronically Signed   By: Logan Bores M.D.   On: 07/27/2016 16:20   [name] Contrast  Result Date: 07/27/2016 CLINICAL DATA:  Double vision. EXAM: MRI HEAD WITHOUT CONTRAST TECHNIQUE: Multiplanar, multiecho pulse sequences of the brain and surrounding structures were obtained without intravenous contrast. COMPARISON:  Head CT 07/26/2016 FINDINGS: Brain: No acute infarct or intraparenchymal hemorrhage. Specifically, there is no signal abnormality in the areas of the nuclei of the third, fourth or sixth cranial nerves or in the region of the medial longitudinal fasciculus and paramedian pontine reticular formation. Non-fat-suppressed images of the optic nerves are normal. There is a partially empty sella. The midline structures are otherwise normal. No focal parenchymal signal abnormality. No mass lesion or midline shift. No hydrocephalus or extra-axial fluid collection. Vascular: Major intracranial arterial and venous sinus flow voids are preserved. No evidence of chronic  microhemorrhage or amyloid angiopathy. Skull and upper cervical spine: The visualized skull base, calvarium, upper cervical spine and extracranial soft tissues are normal. Sinuses/Orbits: No fluid levels or advanced mucosal thickening. No mastoid effusion. Normal orbits. IMPRESSION: Normal brain MR. No findings to explain the reported diplopia. Electronically Signed   By: Ulyses Jarred M.D.   On: 07/27/2016 14:11    ASSESSMENT AND PLAN:   Active Problems:   Hypertriglyceridemia   Chronic pancreatitis (HCC)   Hyponatremia   Hypokalemia  #1, hypertriglyceridemia,    insulin IV drip, follow triglycerides daily, continue Lopid and omega-3 fatty acids,    Appreciated Dr.Solum's help to manage hypertriglyceridemia.   On D10 drip + high dose insulin drip.   TG slightly better now. #2 chronic pancreatitis exacerbation, supportive therapy with IV fluids, pain medications #3. Hyponatremia, IV fluids with normal saline, follow sodium level in the morning #4. Hypokalemia, supplement intravenously, check magnesium level, supplemented as needed   Now added K to IV fluids. #5. BPH, advance patient's finasteride and Flomax, per outpatient urologist recommendations    He still had retention today, and > 500 ml urine on bladder scan - called urology consult.    As he had similar complains in past, will monitor with foley and try to remove it near end of his stay. #6. Diabetes mellitus, continue insulin IV drip for now, endocrinologist consultation is appreciated,     Need to start back on insulin pump once TG is under control. #7 Double vision   CT head negative, Did MRI brain and MRA -  negative- appreciated neurology consult.   Eye patch to help.  Due to requirement of frequent blood sugar monitoring and insulin drip , he is in step down unit, may transfer to floor - if possible to provide this care.  All the records are reviewed and case discussed with Care Management/Social Workerr. Management plans  discussed with the patient, family and they are in agreement.  CODE STATUS: full  TOTAL TIME TAKING CARE OF THIS PATIENT: 35 minutes.    POSSIBLE D/C IN 2-3 DAYS, DEPENDING ON CLINICAL CONDITION.   Vaughan Basta M.D on 07/28/2016   Between 7am to 6pm - Pager - [phone]  After 6pm go to [email or web address] - password [email or web address] [facility]  Office  [phone]  CC: Primary care physician; Crecencio Mc, MD  Note: This dictation was prepared with Dragon dictation along with smaller phrase technology. Any transcriptional errors that result from this process are unintentional.

## 2016-07-28 NOTE — Progress Notes (Signed)
[place] for Electrolyte Management Indication: Insulin Drip for hypertriglyceridemia   40 y/o M with a h/o familial hypertriglyceridemia, related pancreatitis, DM, HTN,  and obesity on insulin pump admitted with elevated TG. Patient currently ordered insulin drip at 12 units/hr and D10%/0.45NS at 100 mL/hr. Patient initiated on Potassium 14mEq PO BID.   Plan:  No additional replacement warranted at this time.   Patient with mild hypoglycemia. Will increase rate of D10%/0.45NS to 150 mL/hr.  10/18:  K @ 18:15 = 3.7 .   No additional K supplementation needed at this time.  Will recheck electrolytes on 10/19 with AM labs.   Will continue to obtain electrolytes every twelve hours while patient is on insulin infusion.     Allergies  Allergen Reactions  . Codeine Anaphylaxis  . Ivp Dye [Iodinated Diagnostic Agents] Other (See Comments)    Kidneys stop working    Patient Measurements: Height: 5\' 11"  (180.3 cm) Weight: 242 lb 15.2 oz (110.2 kg) IBW/kg (Calculated) : 75.3  Vital Signs: Temp: 98.4 F (36.9 C) (10/18 1400) Temp Source: Oral (10/18 1400) BP: 112/68 (10/18 1800) Pulse Rate: 78 (10/18 1800) Intake/Output from previous day: 10/17 0701 - 10/18 0700 In: 2830.6 [P.O.:50; I.V.:2780.6] Out: 3050 [Urine:3050] Intake/Output from this shift: No intake/output data recorded.  Labs:  Recent Labs  07/26/16 1223  07/27/16 1005 07/27/16 1733 07/28/16 0333 07/28/16 1815  CREATININE 0.68  < > 1.23 0.78 0.83 0.75  MG 2.9*  --  1.9  --  1.8  --   PHOS 4.5  --  5.5*  --   --   --   < > = values in this interval not displayed. Estimated Creatinine Clearance: 158.1 mL/min (by C-G formula based on SCr of 0.75 mg/dL).  Pharmacy will continue to monitor and adjust per consult.    Sergio [name] D, 07/28/2016,7:07 PM

## 2016-07-28 NOTE — Progress Notes (Signed)
Subjective: Patient without new neurological complaints.  Continued diplopia.    Objective: Current vital signs: BP (!) 121/105   Pulse 75   Temp 98.6 F (37 C) (Oral)   Resp (!) 6   Ht 5' 11"  (1.803 m)   Wt 110.2 kg (242 lb 15.2 oz)   SpO2 95%   BMI 33.88 kg/m  Vital signs in last 24 hours: Temp:  [97.9 F (36.6 C)-98.9 F (37.2 C)] 98.6 F (37 C) (10/18 0800) Pulse Rate:  [61-82] 75 (10/18 1300) Resp:  [5-18] 6 (10/18 1300) BP: (116-141)/(80-105) 121/105 (10/18 1200) SpO2:  [91 %-100 %] 95 % (10/18 1300)  Intake/Output from previous day: 10/17 0701 - 10/18 0700 In: 2830.6 [P.O.:50; I.V.:2780.6] Out: 3050 [Urine:3050] Intake/Output this shift: Total I/O In: 3 [I.V.:3] Out: -  Nutritional status: Diet NPO time specified  Neurologic Exam: Mental Status: Alert, oriented, thought content appropriate.  Speech fluent without evidence of aphasia.  Able to follow 3 step commands without difficulty. Cranial Nerves: II: Discs flat bilaterally; Visual fields grossly normal, pupils equal, round, reactive to light and accommodation III,IV, VI: ptosis not present, left sixth nerve palsy V,VII: smile symmetric, facial light touch sensation normal bilaterally VIII: hearing normal bilaterally IX,X: gag reflex present XI: bilateral shoulder shrug XII: midline tongue extension Motor: Right :  Upper extremity   5/5                                      Left:     Upper extremity   5/5             Lower extremity   5/5                                                  Lower extremity   5/5 Tone and bulk:normal tone throughout; no atrophy noted   Lab Results: Basic Metabolic Panel:  Recent Labs Lab 07/24/16 1809  07/26/16 1223 07/27/16 0442 07/27/16 1005 07/27/16 1733 07/28/16 0333  NA  --   < > 128* 137 135 134* 135  K  --   < > 4.2 5.5* 5.3* 3.9 3.9  CL  --   < > 93* 97* 100* 98* 99*  CO2  --   < > 25 28 26 27 25   GLUCOSE  --   < > 99 77 141* 116* 82  BUN  --   < > 6 <5*  <5* <5* <5*  CREATININE  --   < > 0.68 0.98 1.23 0.78 0.83  CALCIUM  --   < > 8.5* 8.6* 8.5* 8.5* 8.6*  MG 5.1*  --  2.9*  --  1.9  --  1.8  PHOS  --   --  4.5  --  5.5*  --   --   < > = values in this interval not displayed.  Liver Function Tests:  Recent Labs Lab 07/24/16 1123 07/24/16 1333 07/25/16 0452  AST 21 19 18   ALT 22 19 19   ALKPHOS 71 46 50  BILITOT <0.1* <0.1* 1.0  PROT 5.1* 4.5* 7.4  ALBUMIN 4.9 4.2 4.1    Recent Labs Lab 07/24/16 1123  LIPASE 17   No results for input(s): AMMONIA in the last 168 hours.  CBC:  Recent Labs Lab 07/24/16 1123 07/25/16 0452  WBC 5.3 5.7  HGB UNABLE TO REPORT DUE TO LIPEMIC INTERFERENCE 13.0  HCT 40.7 37.9*  MCV 76.7* 79.3*  PLT 273 217    Cardiac Enzymes: No results for input(s): CKTOTAL, CKMB, CKMBINDEX, TROPONINI in the last 168 hours.  Lipid Panel:  Recent Labs Lab 07/25/16 0452 07/26/16 0317 07/27/16 0442 07/27/16 1005 07/28/16 0333  TRIG >5,000* >5,000* >5,000* >5,000* 4,097*    CBG:  Recent Labs Lab 07/28/16 0938 07/28/16 1037 07/28/16 1127 07/28/16 1257 07/28/16 1350  GLUCAP 68 111* 92 72 65    Microbiology: Results for orders placed or performed during the hospital encounter of 07/24/16  MRSA PCR Screening     Status: None   Collection Time: 07/24/16 10:23 PM  Result Value Ref Range Status   MRSA by PCR NEGATIVE NEGATIVE Final    Comment:        The GeneXpert MRSA Assay (FDA approved for NASAL specimens only), is one component of a comprehensive MRSA colonization surveillance program. It is not intended to diagnose MRSA infection nor to guide or monitor treatment for MRSA infections.     Coagulation Studies: No results for input(s): LABPROT, INR in the last 72 hours.  Imaging: Ct Head Wo Contrast  Result Date: 07/26/2016 CLINICAL DATA:  Double vision this morning. EXAM: CT HEAD WITHOUT CONTRAST TECHNIQUE: Contiguous axial images were obtained from the base of the skull  through the vertex without intravenous contrast. COMPARISON:  None. FINDINGS: Brain: No evidence for acute hemorrhage, mass lesion, midline shift, hydrocephalus or large infarct. Vascular: No hyperdense vessel or unexpected calcification. Skull: Normal. Negative for fracture or focal lesion. Sinuses/Orbits: Mild mucosal disease in the ethmoid air cells. Other: None. IMPRESSION: No acute intracranial abnormality. Electronically Signed   By: Markus Daft M.D.   On: 07/26/2016 18:22   [name] Head Wo Contrast  Result Date: 07/27/2016 CLINICAL DATA:  Diplopia.  Left sixth nerve palsy. EXAM: MRA HEAD WITHOUT CONTRAST TECHNIQUE: Angiographic images of the Circle of Willis were obtained using MRA technique without intravenous contrast. COMPARISON:  None. FINDINGS: The visualized distal vertebral arteries are patent and codominant. PICA origins were not imaged. Left AICA is dominant. SCA origins are patent. Basilar artery is widely patent. Posterior communicating arteries are not identified. PCAs are patent without evidence of significant stenosis. The internal carotid arteries are widely patent from skullbase to carotid termini. ACAs and MCAs are patent without evidence of major branch occlusion or significant proximal stenosis. No aneurysm is identified. IMPRESSION: Negative head MRA. Electronically Signed   By: Logan Bores M.D.   On: 07/27/2016 16:20   [name] Contrast  Result Date: 07/27/2016 CLINICAL DATA:  Double vision. EXAM: MRI HEAD WITHOUT CONTRAST TECHNIQUE: Multiplanar, multiecho pulse sequences of the brain and surrounding structures were obtained without intravenous contrast. COMPARISON:  Head CT 07/26/2016 FINDINGS: Brain: No acute infarct or intraparenchymal hemorrhage. Specifically, there is no signal abnormality in the areas of the nuclei of the third, fourth or sixth cranial nerves or in the region of the medial longitudinal fasciculus and paramedian pontine reticular formation.  Non-fat-suppressed images of the optic nerves are normal. There is a partially empty sella. The midline structures are otherwise normal. No focal parenchymal signal abnormality. No mass lesion or midline shift. No hydrocephalus or extra-axial fluid collection. Vascular: Major intracranial arterial and venous sinus flow voids are preserved. No evidence of chronic microhemorrhage or amyloid angiopathy. Skull and upper cervical spine: The visualized skull base,  calvarium, upper cervical spine and extracranial soft tissues are normal. Sinuses/Orbits: No fluid levels or advanced mucosal thickening. No mastoid effusion. Normal orbits. IMPRESSION: Normal brain MR. No findings to explain the reported diplopia. Electronically Signed   By: Ulyses Jarred M.D.   On: 07/27/2016 14:11    Medications:  I have reviewed the patient's current medications. Scheduled: . docusate sodium  100 mg Oral BID  . enoxaparin (LOVENOX) injection  40 mg Subcutaneous Q24H  . finasteride  5 mg Oral Daily  . gemfibrozil  600 mg Oral BID AC  . omega-3 acid ethyl esters  4 g Oral QHS  . pneumococcal 23 valent vaccine  0.5 mL Intramuscular Tomorrow-1000  . pregabalin  300 mg Oral QHS  . sertraline  100 mg Oral Daily  . sodium chloride flush  3 mL Intravenous Q12H  . tamsulosin  0.8 mg Oral Daily  . traZODone  100 mg Oral QHS    Assessment/Plan: Patient without new neurological complaints.  Continued diplopia and sixth nerve palsy on examination.  Likely secondary to DM.  MRA of the brain personally reviewed and shows no evidence of aneurysm.  Lyme titer negative.  CRP 0.8, folate 19.8, B12 265, ESR 37.  Remaining lab work pending.    Recommendations: 1.  B12 supplementation    LOS: 4 days   Alexis Goodell, MD Neurology [phone] 07/28/2016  2:18 PM

## 2016-07-28 NOTE — Progress Notes (Addendum)
[place] for Electrolyte Management Indication: Insulin Drip for hypertriglyceridemia   40 y/o M with a h/o familial hypertriglyceridemia, related pancreatitis, DM, HTN,  and obesity on insulin pump admitted with elevated TG. Patient currently ordered insulin drip at 12 units/hr and D10%/0.45NS at 100 mL/hr. Patient initiated on Potassium 68mEq PO BID.   Plan:  No additional replacement warranted at this time.   Patient with mild hypoglycemia. Will increase rate of D10%/0.45NS to 150 mL/hr.  Will continue to obtain electrolytes every twelve hours while patient is on insulin infusion.     Allergies  Allergen Reactions  . Codeine Anaphylaxis  . Ivp Dye [Iodinated Diagnostic Agents] Other (See Comments)    Kidneys stop working    Patient Measurements: Height: 5\' 11"  (180.3 cm) Weight: 242 lb 15.2 oz (110.2 kg) IBW/kg (Calculated) : 75.3  Vital Signs: Temp: 98.6 F (37 C) (10/18 0800) Temp Source: Oral (10/18 0800) BP: 125/83 (10/18 1000) Pulse Rate: 68 (10/18 1100) Intake/Output from previous day: 10/17 0701 - 10/18 0700 In: 2830.6 [P.O.:50; I.V.:2780.6] Out: 3050 [Urine:3050] Intake/Output from this shift: Total I/O In: 3 [I.V.:3] Out: -   Labs:  Recent Labs  07/26/16 1223  07/27/16 1005 07/27/16 1733 07/28/16 0333  CREATININE 0.68  < > 1.23 0.78 0.83  MG 2.9*  --  1.9  --  1.8  PHOS 4.5  --  5.5*  --   --   < > = values in this interval not displayed. Estimated Creatinine Clearance: 152.4 mL/min (by C-G formula based on SCr of 0.83 mg/dL).  Pharmacy will continue to monitor and adjust per consult.    Amrita Radu L, 07/28/2016,1:16 PM

## 2016-07-28 NOTE — Progress Notes (Signed)
Endocrinology follow up  REFERRING PHYSICIAN:  Myrla Halsted, MD CONSULTING PHYSICIAN:  A. Lavone Orn, MD. PRIMARY CARE PROVIDER: [name], MD.  History of present illness Sergio [name] is a 39 y.o. male seen in follow up for type 2 diabetes and familial hypertriglyceridemia with recurrent acute pancreatitis due to hypertriglyceridemia. He was admitted on 07/25/16 with acute pancreatitis.  Now receiving insulin 12 units/hr and D10 125 cc/hr. Sugars have been in the 68-111 range today. He reports persistent blurred vision. Head CT neg. MRI / MRA neg. Has been seen by Neurology. Reports nausea due to diplopia, better when he closes one eye.Persistent abd pain. Rates at 7/10 iwthout pain meds and 1/10 with pain meds. No N/V. Appetite still low.  Medical history Past Medical History:  Diagnosis Date  . Anxiety   . Chicken pox   . Depression   . Diabetes mellitus   . Familial hypertriglyceridemia    severe  . Gastric ulcer 2009  . HTN (hypertension)   . Morbid obesity ([place])    s/p bariatric sleeve surgery 01/2015  . Recurrent acute pancreatitis    secondary to hypertriglyceridemia   . Sleep apnea 1999   uses CPAP     Surgical history Past Surgical History:  Procedure Laterality Date  . CHOLECYSTECTOMY    . LAPAROSCOPIC GASTRIC SLEEVE RESECTION    . [place] SURGERY  2008  . TONSILLECTOMY  2003  . VASECTOMY       Medications . docusate sodium  100 mg Oral BID  . enoxaparin (LOVENOX) injection  40 mg Subcutaneous Q24H  . finasteride  5 mg Oral Daily  . gemfibrozil  600 mg Oral BID AC  . omega-3 acid ethyl esters  4 g Oral QHS  . pneumococcal 23 valent vaccine  0.5 mL Intramuscular Tomorrow-1000  . pregabalin  300 mg Oral QHS  . sertraline  100 mg Oral Daily  . sodium chloride flush  3 mL Intravenous Q12H  . tamsulosin  0.8 mg Oral Daily  . traZODone  100 mg Oral QHS     Social history Social History  Substance Use Topics  . Smoking status: Never Smoker   . Smokeless tobacco: Never Used  . Alcohol use No     Comment: once month     Family history Family History  Problem Relation Age of Onset  . Adopted: Yes  . Hypertension Mother   . Hyperlipidemia Mother   . Hypertension Father   . Hyperlipidemia Father     Review of systems CV: no chest pain or palpitations PULM: no cough or shortness of breath   Physical Exam BP 125/83   Pulse 68   Temp 98.6 F (37 C) (Oral)   Resp (!) 6   Ht 5\' 11"  (1.803 m)   Wt 110.2 kg (242 lb 15.2 oz)   SpO2 94%   BMI 33.88 kg/m   GEN: white male in NAD. HEENT: No proptosis, lid lag or stare. Oropharynx is clear.  NECK: supple, trachea midline.   RESPIRATORY: clear bilaterally, no wheeze, good inspiratory effort. CV: No carotid bruits, RRR. MUSCULOSKELETAL: no peripheral edema, no clubbing, no tremor ABD: soft, +epigastric TTP, decreased BS SKIN: +few xanthoma in the arms, unchanged LYMPH: no submandibular or supraclavicular LAD NEURO: PERRL, EOMI PSYC: alert and oriented, good insight  Labs Results for orders placed or performed during the hospital encounter of 07/24/16 (from the past 24 hour(s))  Glucose, capillary     Status: None   Collection Time: 07/27/16  1:24 PM  Result Value Ref Range   Glucose-Capillary 87 65 - 99 mg/dL  Glucose, capillary     Status: Abnormal   Collection Time: 07/27/16  2:19 PM  Result Value Ref Range   Glucose-Capillary 116 (H) 65 - 99 mg/dL  Glucose, capillary     Status: Abnormal   Collection Time: 07/27/16  3:19 PM  Result Value Ref Range   Glucose-Capillary 137 (H) 65 - 99 mg/dL  Glucose, capillary     Status: Abnormal   Collection Time: 07/27/16  4:14 PM  Result Value Ref Range   Glucose-Capillary 131 (H) 65 - 99 mg/dL  Glucose, capillary     Status: Abnormal   Collection Time: 07/27/16  5:04 PM  Result Value Ref Range   Glucose-Capillary 127 (H) 65 - 99 mg/dL  Basic metabolic panel     Status: Abnormal   Collection Time: 07/27/16  5:33 PM   Result Value Ref Range   Sodium 134 (L) 135 - 145 mmol/L   Potassium 3.9 3.5 - 5.1 mmol/L   Chloride 98 (L) 101 - 111 mmol/L   CO2 27 22 - 32 mmol/L   Glucose, Bld 116 (H) 65 - 99 mg/dL   BUN <5 (L) 6 - 20 mg/dL   Creatinine, Ser 0.78 0.61 - 1.24 mg/dL   Calcium 8.5 (L) 8.9 - 10.3 mg/dL   GFR calc non Af Amer >60 >60 mL/min   GFR calc Af Amer >60 >60 mL/min   Anion gap 9 5 - 15  Sedimentation rate     Status: Abnormal   Collection Time: 07/27/16  5:33 PM  Result Value Ref Range   Sed Rate 37 (H) 0 - 15 mm/hr  Vitamin B12     Status: None   Collection Time: 07/27/16  5:33 PM  Result Value Ref Range   Vitamin B-12 265 180 - 914 pg/mL  Folate     Status: None   Collection Time: 07/27/16  5:33 PM  Result Value Ref Range   Folate 19.8 >5.9 ng/mL  C-reactive protein     Status: None   Collection Time: 07/27/16  5:33 PM  Result Value Ref Range   CRP <0.8 <1.0 mg/dL  Glucose, capillary     Status: Abnormal   Collection Time: 07/27/16  6:06 PM  Result Value Ref Range   Glucose-Capillary 110 (H) 65 - 99 mg/dL  Glucose, capillary     Status: None   Collection Time: 07/27/16  8:02 PM  Result Value Ref Range   Glucose-Capillary 79 65 - 99 mg/dL   Comment 1 Notify RN   Glucose, capillary     Status: None   Collection Time: 07/27/16  8:59 PM  Result Value Ref Range   Glucose-Capillary 80 65 - 99 mg/dL   Comment 1 Notify RN   Glucose, capillary     Status: None   Collection Time: 07/27/16  9:59 PM  Result Value Ref Range   Glucose-Capillary 75 65 - 99 mg/dL   Comment 1 Notify RN   Glucose, capillary     Status: None   Collection Time: 07/27/16 11:02 PM  Result Value Ref Range   Glucose-Capillary 70 65 - 99 mg/dL   Comment 1 Notify RN   Glucose, capillary     Status: None   Collection Time: 07/27/16 11:55 PM  Result Value Ref Range   Glucose-Capillary 72 65 - 99 mg/dL   Comment 1 Notify RN   Glucose, capillary  Status: None   Collection Time: 07/28/16 12:54 AM  Result  Value Ref Range   Glucose-Capillary 73 65 - 99 mg/dL   Comment 1 Notify RN   Glucose, capillary     Status: None   Collection Time: 07/28/16  2:02 AM  Result Value Ref Range   Glucose-Capillary 67 65 - 99 mg/dL   Comment 1 Notify RN   Glucose, capillary     Status: Abnormal   Collection Time: 07/28/16  2:20 AM  Result Value Ref Range   Glucose-Capillary 116 (H) 65 - 99 mg/dL   Comment 1 Notify RN   Glucose, capillary     Status: None   Collection Time: 07/28/16  2:57 AM  Result Value Ref Range   Glucose-Capillary 97 65 - 99 mg/dL  Triglycerides     Status: Abnormal   Collection Time: 07/28/16  3:33 AM  Result Value Ref Range   Triglycerides 4,097 (H) <150 mg/dL  Basic metabolic panel     Status: Abnormal   Collection Time: 07/28/16  3:33 AM  Result Value Ref Range   Sodium 135 135 - 145 mmol/L   Potassium 3.9 3.5 - 5.1 mmol/L   Chloride 99 (L) 101 - 111 mmol/L   CO2 25 22 - 32 mmol/L   Glucose, Bld 82 65 - 99 mg/dL   BUN <5 (L) 6 - 20 mg/dL   Creatinine, Ser 0.83 0.61 - 1.24 mg/dL   Calcium 8.6 (L) 8.9 - 10.3 mg/dL   GFR calc non Af Amer >60 >60 mL/min   GFR calc Af Amer >60 >60 mL/min   Anion gap 11 5 - 15  Magnesium     Status: None   Collection Time: 07/28/16  3:33 AM  Result Value Ref Range   Magnesium 1.8 1.7 - 2.4 mg/dL  Glucose, capillary     Status: None   Collection Time: 07/28/16  4:03 AM  Result Value Ref Range   Glucose-Capillary 91 65 - 99 mg/dL  Glucose, capillary     Status: None   Collection Time: 07/28/16  8:05 AM  Result Value Ref Range   Glucose-Capillary 87 65 - 99 mg/dL  Glucose, capillary     Status: None   Collection Time: 07/28/16  9:38 AM  Result Value Ref Range   Glucose-Capillary 68 65 - 99 mg/dL  Glucose, capillary     Status: Abnormal   Collection Time: 07/28/16 10:37 AM  Result Value Ref Range   Glucose-Capillary 111 (H) 65 - 99 mg/dL  Glucose, capillary     Status: None   Collection Time: 07/28/16 11:27 AM  Result Value Ref Range    Glucose-Capillary 92 65 - 99 mg/dL  Glucose, capillary     Status: None   Collection Time: 07/28/16 12:57 PM  Result Value Ref Range   Glucose-Capillary 85 65 - 99 mg/dL     Assessment 1.Acute pancreatitis due to hypertriglyceridemia. 2.Familial hypertriglyceridemia 3.Type 2 diabetes   4.    Diplopia  Plan 1.   Continue IVF and IV insulin. Recommend that IV insulin stay ay 12 units/hr and dose of D10 be adjusted to keep sugars in the 80-150 range.   2.   Triglyceride level slowly improving. Trend triglycerides daily and continue IV insulin until serum Tg level <1000. 3.   Keep NPO except sugar free popsicles. Continue fibrate, and fish oil for now. Resume statin before discharge. 4.   He will need to restart insulin pump once ready to transition off the  IV insulin. He will need to have family bring in the necessary supplies. Prior trend has been 4-5 days before his Tg level is <1000. 5.   For diplopia, may need opthalmology follow up after discharge. He would benefit from an eye patch, as suggested by neurology.   Will follow along.

## 2016-07-29 LAB — BASIC METABOLIC PANEL
ANION GAP: 8 (ref 5–15)
Anion gap: 9 (ref 5–15)
BUN: <5 mg/dL — ABNORMAL LOW (ref 6–20)
CALCIUM: 8.1 mg/dL — AB (ref 8.9–10.3)
CALCIUM: 8.7 mg/dL — AB (ref 8.9–10.3)
CO2: 27 mmol/L (ref 22–32)
CO2: 28 mmol/L (ref 22–32)
Chloride: 99 mmol/L — ABNORMAL LOW (ref 101–111)
Chloride: 101 mmol/L (ref 101–111)
Creatinine, Ser: 0.79 mg/dL (ref 0.61–1.24)
Creatinine, Ser: 0.79 mg/dL (ref 0.61–1.24)
GFR calc Af Amer: >60 mL/min (ref >60)
GLUCOSE: 322 mg/dL — AB (ref 65–99)
Glucose, Bld: 100 mg/dL — ABNORMAL HIGH (ref 65–99)
POTASSIUM: 2.9 mmol/L — AB (ref 3.5–5.1)
Potassium: 3.7 mmol/L (ref 3.5–5.1)
Sodium: 135 mmol/L (ref 135–145)
Sodium: 137 mmol/L (ref 135–145)

## 2016-07-29 LAB — VITAMIN B1: VITAMIN B1 (THIAMINE): 89.1 nmol/L (ref 66.5–200.0)

## 2016-07-29 LAB — GLUCOSE, CAPILLARY
GLUCOSE-CAPILLARY: 114 mg/dL — AB (ref 65–99)
GLUCOSE-CAPILLARY: 147 mg/dL — AB (ref 65–99)
GLUCOSE-CAPILLARY: 163 mg/dL — AB (ref 65–99)
GLUCOSE-CAPILLARY: 85 mg/dL (ref 65–99)
GLUCOSE-CAPILLARY: 92 mg/dL (ref 65–99)
GLUCOSE-CAPILLARY: 94 mg/dL (ref 65–99)
Glucose-Capillary: 100 mg/dL — ABNORMAL HIGH (ref 65–99)
Glucose-Capillary: 100 mg/dL — ABNORMAL HIGH (ref 65–99)
Glucose-Capillary: 107 mg/dL — ABNORMAL HIGH (ref 65–99)
Glucose-Capillary: 125 mg/dL — ABNORMAL HIGH (ref 65–99)
Glucose-Capillary: 127 mg/dL — ABNORMAL HIGH (ref 65–99)
Glucose-Capillary: 77 mg/dL (ref 65–99)
Glucose-Capillary: 85 mg/dL (ref 65–99)
Glucose-Capillary: 86 mg/dL (ref 65–99)
Glucose-Capillary: 90 mg/dL (ref 65–99)
Glucose-Capillary: 93 mg/dL (ref 65–99)
Glucose-Capillary: 96 mg/dL (ref 65–99)
Glucose-Capillary: 98 mg/dL (ref 65–99)

## 2016-07-29 LAB — ACETYLCHOLINE RECEPTOR, BINDING

## 2016-07-29 LAB — STRIATED MUSCLE ANTIBODY

## 2016-07-29 LAB — ANTI-SMOOTH MUSCLE ANTIBODY, IGG

## 2016-07-29 LAB — TRIGLYCERIDES
TRIGLYCERIDES: 1979 mg/dL — AB (ref <150)
Triglycerides: 2356 mg/dL — ABNORMAL HIGH (ref <150)

## 2016-07-29 LAB — TSH: TSH: 0.507 u[IU]/mL (ref 0.350–4.500)

## 2016-07-29 LAB — PHOSPHORUS: PHOSPHORUS: 4.1 mg/dL (ref 2.5–4.6)

## 2016-07-29 LAB — MAGNESIUM: Magnesium: 1.8 mg/dL (ref 1.7–2.4)

## 2016-07-29 MED ORDER — INSULIN GLARGINE 100 UNIT/ML ~~LOC~~ SOLN
20.0000 [IU] | SUBCUTANEOUS | Status: DC
  Filled 2016-07-29: qty 0.2

## 2016-07-29 MED ORDER — POTASSIUM CHLORIDE CRYS ER 20 MEQ PO TBCR
40.0000 meq | EXTENDED_RELEASE_TABLET | Freq: Once | ORAL | Status: AC
  Administered 2016-07-29: 40 meq via ORAL
  Filled 2016-07-29: qty 2

## 2016-07-29 MED ORDER — OXYCODONE HCL ER 10 MG PO T12A
10.0000 mg | EXTENDED_RELEASE_TABLET | Freq: Two times a day (BID) | ORAL | Status: DC
  Administered 2016-07-29 – 2016-07-30 (×3): 10 mg via ORAL
  Filled 2016-07-29 (×3): qty 1

## 2016-07-29 MED ORDER — MAGNESIUM SULFATE 2 GM/50ML IV SOLN
2.0000 g | Freq: Once | INTRAVENOUS | Status: AC
  Administered 2016-07-29: 2 g via INTRAVENOUS
  Filled 2016-07-29: qty 50

## 2016-07-29 MED ORDER — POTASSIUM CHLORIDE 2 MEQ/ML IV SOLN
Freq: Once | INTRAVENOUS | Status: DC
  Filled 2016-07-29: qty 25

## 2016-07-29 MED ORDER — POTASSIUM CHLORIDE 20 MEQ PO PACK
40.0000 meq | PACK | Freq: Once | ORAL | Status: DC
  Filled 2016-07-29: qty 2

## 2016-07-29 MED ORDER — ROSUVASTATIN CALCIUM 10 MG PO TABS
10.0000 mg | ORAL_TABLET | Freq: Every day | ORAL | Status: DC
  Administered 2016-07-29 – 2016-08-03 (×6): 10 mg via ORAL
  Filled 2016-07-29 (×6): qty 1

## 2016-07-29 MED ORDER — VITAMIN B-12 1000 MCG PO TABS
1000.0000 ug | ORAL_TABLET | Freq: Every day | ORAL | Status: DC
Start: 2016-07-29 — End: 2016-08-04
  Administered 2016-07-29 – 2016-08-04 (×7): 1000 ug via ORAL
  Filled 2016-07-29 (×7): qty 1

## 2016-07-29 MED ORDER — CYANOCOBALAMIN 1000 MCG/ML IJ SOLN
1000.0000 ug | Freq: Once | INTRAMUSCULAR | Status: AC
  Administered 2016-07-29: 1000 ug via INTRAMUSCULAR
  Filled 2016-07-29 (×2): qty 1

## 2016-07-29 MED ORDER — POTASSIUM CHLORIDE 2 MEQ/ML IV SOLN
Freq: Once | INTRAVENOUS | Status: AC
  Administered 2016-07-29: 10:00:00 via INTRAVENOUS
  Filled 2016-07-29: qty 500

## 2016-07-29 NOTE — Progress Notes (Signed)
[facility] for [facility]ly[facility] Management Indication: Insulin Drip for hypertriglyceridemia   40 y/o M with a h/o familial hypertriglyceridemia, related pancreatitis, DM, HTN,  and obesity on insulin pump admitted with elevated TG. Patient currently ordered insulin drip at 12 units/hr and D10%/0.45NS at 150 mL/hr. Patient initiated on Potassium 16mEq PO BID. Patient received potassium 97mEq IV x 1 and 49mEq PO x 1.   Plan:  Will order potasium 3mEq PO x 1. Goal potassium > 4 while on insulin drip therapy.    Will continue rate of D10%/0.45NS to 150 mL/hr.  Will continue to obtain electrolytes every twelve hours while patient is on insulin infusion.     Allergies  Allergen Reactions  . Codeine Anaphylaxis  . Ivp Dye [Iodinated Diagnostic Agents] Other (See Comments)    Kidneys stop working    Patient Measurements: Height: 5\' 11"  (180.3 cm) Weight: 242 lb 15.2 oz (110.2 kg) IBW/kg (Calculated) : 75.3  Vital Signs: BP: 118/90 (10/19 1600) Pulse Rate: 67 (10/19 1600) Intake/Output from previous day: 10/18 0701 - 10/19 0700 In: 3723.6 [P.O.:240; I.V.:3483.6] Out: 2950 [Urine:2950] Intake/Output from this shift: No intake/output data recorded.  Labs:  Recent Labs  07/27/16 1005  07/28/16 0333 07/28/16 1815 07/29/16 0630 07/29/16 1744  CREATININE 1.23  < > 0.83 0.75 0.79 0.79  MG 1.9  --  1.8  --  1.8  --   PHOS 5.5*  --   --   --   --  4.1  < > = values in this interval not displayed. Estimated Creatinine Clearance: 158.1 mL/min (by C-G formula based on SCr of 0.79 mg/dL).  Pharmacy will continue to monitor and adjust per consult.    Renly Roots L, 07/29/2016,7:21 PM

## 2016-07-29 NOTE — Progress Notes (Signed)
[facility] [place] NAME: Sergio [name]    MR#:  [id number]  DATE OF BIRTH:  1976-01-06  SUBJECTIVE:  CHIEF COMPLAINT:   Chief Complaint  Patient presents with  . Abdominal Pain    Came with hypertriglyceridemia, TG > 5000.   Had c/o double vision , no other complains of numbness or weakness.  he have urinary retention, foley placed.   MRI brain and MRA are negative.  REVIEW OF SYSTEMS:   CONSTITUTIONAL: No fever, fatigue or weakness.  EYES: No blurred, but have double vision.  EARS, NOSE, AND THROAT: No tinnitus or ear pain.  RESPIRATORY: No cough, shortness of breath, wheezing or hemoptysis.  CARDIOVASCULAR: No chest pain, orthopnea, edema.  GASTROINTESTINAL: No nausea, vomiting, diarrhea or abdominal pain.  GENITOURINARY: No dysuria, hematuria.  ENDOCRINE: No polyuria, nocturia,  HEMATOLOGY: No anemia, easy bruising or bleeding SKIN: No rash or lesion. MUSCULOSKELETAL: No joint pain or arthritis.   NEUROLOGIC: No tingling, numbness, weakness.  PSYCHIATRY: No anxiety or depression.   ROS  DRUG ALLERGIES:   Allergies  Allergen Reactions  . Codeine Anaphylaxis  . Ivp Dye [Iodinated Diagnostic Agents] Other (See Comments)    Kidneys stop working    VITALS:  Blood pressure 118/90, pulse 67, temperature 98.3 F (36.8 C), resp. rate 13, height 5\' 11"  (1.803 m), weight 110.2 kg (242 lb 15.2 oz), SpO2 91 %.  PHYSICAL EXAMINATION:   GENERAL:  40 y.o.-year-old patient lying in the bed with no acute distress.  EYES: Pupils equal, round, reactive to light and accommodation. No scleral icterus. Extraocular muscles intact.  HEENT: Head atraumatic, normocephalic. Oropharynx and nasopharynx clear.  NECK:  Supple, no jugular venous distention. No thyroid enlargement, no tenderness.  LUNGS: Normal breath sounds bilaterally, no wheezing, rales,rhonchi or crepitation. No use of accessory muscles of respiration.  CARDIOVASCULAR: S1, S2  normal. No murmurs, rubs, or gallops.  ABDOMEN: Soft,  Mild tender, nondistended. Bowel sounds present. No organomegaly or mass.  EXTREMITIES: No pedal edema, cyanosis, or clubbing.  NEUROLOGIC: Cranial nerves II through XII are intact except left 6th nerve. Muscle strength 5/5 in all extremities. Sensation intact. Gait not checked.  PSYCHIATRIC: The patient is alert and oriented x 3.  SKIN: No obvious rash, lesion, or ulcer.   Physical Exam LABORATORY PANEL:   CBC  Recent Labs Lab 07/25/16 0452  WBC 5.7  HGB 13.0  HCT 37.9*  PLT 217   ------------------------------------------------------------------------------------------------------------------  Chemistries   Recent Labs Lab 07/25/16 0452  07/29/16 0630  NA 138  < > 135  K 3.5  < > 2.9*  CL 105  < > 99*  CO2 24  < > 27  GLUCOSE 133*  < > 322*  BUN 10  < > <5*  CREATININE 0.66  < > 0.79  CALCIUM 8.6*  < > 8.1*  MG  --   < > 1.8  AST 18  --   --   ALT 19  --   --   ALKPHOS 50  --   --   BILITOT 1.0  --   --   < > = values in this interval not displayed. ------------------------------------------------------------------------------------------------------------------  Cardiac Enzymes No results for input(s): TROPONINI in the last 168 hours. ------------------------------------------------------------------------------------------------------------------  RADIOLOGY:  No results found.  ASSESSMENT AND PLAN:   Active Problems:   Hypertriglyceridemia   Chronic pancreatitis (HCC)   Hyponatremia   Hypokalemia   Sixth nerve palsy of left eye  #1, hypertriglyceridemia,  insulin IV drip, follow triglycerides daily, continue Lopid and omega-3 fatty acids,    Appreciated Dr.Solum's help to manage hypertriglyceridemia.   On D10 drip + high dose insulin drip.   TG slightly better now. #2 chronic pancreatitis exacerbation, supportive therapy with IV fluids, pain medications #3. Hyponatremia, IV fluids with  normal saline, follow sodium level in the morning #4. Hypokalemia, supplement intravenously, check magnesium level, supplemented as needed   Now added K to IV fluids. #5. BPH, advance patient's finasteride and Flomax, per outpatient urologist recommendations    He still had retention today, and > 500 ml urine on bladder scan - called urology consult.    As he had similar complains in past, will monitor with foley and try to remove it near end of his stay.   This happens mostly with dilaudid as per pt, so will give oral oxycodone to decrease use of dilaudid and see. #6. Diabetes mellitus, continue insulin IV drip for now, endocrinologist consultation is appreciated,     Need to start back on insulin pump once TG is under control.   Dr. Gabriel Carina explained him about the reduced setting. #7 Double vision   CT head negative, Did MRI brain and MRA - negative- appreciated neurology consult.   Eye patch to help.   Special tests ordered , can not be done due to lipemic serum   Suggested to give vit b12.  Due to requirement of frequent blood sugar monitoring and insulin drip , he is in step down unit, may transfer to floor - if possible to provide this care.  All the records are reviewed and case discussed with Care Management/Social Workerr. Management plans discussed with the patient, family and they are in agreement.  CODE STATUS: full  TOTAL TIME TAKING CARE OF THIS PATIENT: 35 minutes.    POSSIBLE D/C IN 2-3 DAYS, DEPENDING ON CLINICAL CONDITION.   Vaughan Basta M.D on 07/29/2016   Between 7am to 6pm - Pager - [phone]  After 6pm go to [email or web address] - password [email or web address] [facility]  Office  [phone]  CC: Primary care physician; Crecencio Mc, MD  Note: This dictation was prepared with Dragon dictation along with smaller phrase technology. Any transcriptional errors that result from this process are unintentional.

## 2016-07-29 NOTE — Progress Notes (Signed)
Endocrinology follow up  REFERRING PHYSICIAN:  Myrla Halsted, MD CONSULTING PHYSICIAN:  A. Lavone Orn, MD. PRIMARY CARE PROVIDER: [name], MD.  History of present illness Sergio [name] is a 40 y.o. male seen in follow up for type 2 diabetes and familial hypertriglyceridemia with recurrent acute pancreatitis due to hypertriglyceridemia. He was admitted on 07/25/16 with acute pancreatitis.  Now receiving insulin 12 units/hr and D10 105 cc/hr. Tg level significantly improved at 1979. His fingerstick blood sugars have been in the 85-127 range today. He reports persistent blurred vision. Now wearing a single eye patch which is helping. Abd pain improved. No N/V. Appetite still low.  Medical history Past Medical History:  Diagnosis Date  . Anxiety   . Chicken pox   . Depression   . Diabetes mellitus   . Familial hypertriglyceridemia    severe  . Gastric ulcer 2009  . HTN (hypertension)   . Morbid obesity ([place])    s/p bariatric sleeve surgery 01/2015  . Recurrent acute pancreatitis    secondary to hypertriglyceridemia   . Sleep apnea 1999   uses CPAP     Surgical history Past Surgical History:  Procedure Laterality Date  . CHOLECYSTECTOMY    . LAPAROSCOPIC GASTRIC SLEEVE RESECTION    . [place] SURGERY  2008  . TONSILLECTOMY  2003  . VASECTOMY       Medications . docusate sodium  100 mg Oral BID  . enoxaparin (LOVENOX) injection  40 mg Subcutaneous Q24H  . finasteride  5 mg Oral Daily  . gemfibrozil  600 mg Oral BID AC  . magnesium sulfate 1 - 4 g bolus IVPB  2 g Intravenous Once  . omega-3 acid ethyl esters  4 g Oral QHS  . oxyCODONE  10 mg Oral Q12H  . pneumococcal 23 valent vaccine  0.5 mL Intramuscular Tomorrow-1000  . pregabalin  300 mg Oral QHS  . sertraline  100 mg Oral Daily  . sodium chloride flush  3 mL Intravenous Q12H  . tamsulosin  0.8 mg Oral Daily  . traZODone  100 mg Oral QHS  . vitamin B-12  1,000 mcg Oral Daily     Social  history Social History  Substance Use Topics  . Smoking status: Never Smoker  . Smokeless tobacco: Never Used  . Alcohol use No     Comment: once month     Family history Family History  Problem Relation Age of Onset  . Adopted: Yes  . Hypertension Mother   . Hyperlipidemia Mother   . Hypertension Father   . Hyperlipidemia Father     Review of systems CV: no chest pain or palpitations PULM: no cough or shortness of breath   Physical Exam BP 139/87   Pulse 76   Temp 98.3 F (36.8 C)   Resp 14   Ht 5\' 11"  (1.803 m)   Wt 110.2 kg (242 lb 15.2 oz)   SpO2 90%   BMI 33.88 kg/m   GEN: white male in NAD. HEENT: No proptosis, lid lag or stare. Oropharynx is clear.  NECK: supple, trachea midline.   RESPIRATORY: clear bilaterally, no wheeze, good inspiratory effort. CV: No carotid bruits, RRR. MUSCULOSKELETAL: no peripheral edema, no clubbing, no tremor ABD: soft, +mild epigastric TTP, decreased BS SKIN: +few xanthoma in the arms, unchanged LYMPH: no submandibular or supraclavicular LAD NEURO: PERRL, EOMI PSYC: alert and oriented, good insight  Labs Results for orders placed or performed during the hospital encounter of 07/24/16 (from the past  24 hour(s))  Glucose, capillary     Status: None   Collection Time: 07/28/16 12:57 PM  Result Value Ref Range   Glucose-Capillary 85 65 - 99 mg/dL  Glucose, capillary     Status: None   Collection Time: 07/28/16  1:50 PM  Result Value Ref Range   Glucose-Capillary 65 65 - 99 mg/dL  Glucose, capillary     Status: Abnormal   Collection Time: 07/28/16  2:21 PM  Result Value Ref Range   Glucose-Capillary 117 (H) 65 - 99 mg/dL  Glucose, capillary     Status: Abnormal   Collection Time: 07/28/16  3:49 PM  Result Value Ref Range   Glucose-Capillary 126 (H) 65 - 99 mg/dL  Basic metabolic panel     Status: Abnormal   Collection Time: 07/28/16  6:15 PM  Result Value Ref Range   Sodium 137 135 - 145 mmol/L   Potassium 3.7 3.5 -  5.1 mmol/L   Chloride 103 101 - 111 mmol/L   CO2 23 22 - 32 mmol/L   Glucose, Bld 113 (H) 65 - 99 mg/dL   BUN <5 (L) 6 - 20 mg/dL   Creatinine, Ser 0.75 0.61 - 1.24 mg/dL   Calcium 8.7 (L) 8.9 - 10.3 mg/dL   GFR calc non Af Amer >60 >60 mL/min   GFR calc Af Amer >60 >60 mL/min   Anion gap 11 5 - 15  Glucose, capillary     Status: None   Collection Time: 07/28/16  8:28 PM  Result Value Ref Range   Glucose-Capillary 86 65 - 99 mg/dL   Comment 1 Notify RN   Glucose, capillary     Status: None   Collection Time: 07/28/16  9:26 PM  Result Value Ref Range   Glucose-Capillary 94 65 - 99 mg/dL   Comment 1 Notify RN   Glucose, capillary     Status: None   Collection Time: 07/28/16 10:29 PM  Result Value Ref Range   Glucose-Capillary 94 65 - 99 mg/dL   Comment 1 Notify RN   Glucose, capillary     Status: None   Collection Time: 07/28/16 11:26 PM  Result Value Ref Range   Glucose-Capillary 99 65 - 99 mg/dL   Comment 1 Notify RN   Glucose, capillary     Status: None   Collection Time: 07/29/16 12:28 AM  Result Value Ref Range   Glucose-Capillary 90 65 - 99 mg/dL   Comment 1 Notify RN   Glucose, capillary     Status: None   Collection Time: 07/29/16  1:28 AM  Result Value Ref Range   Glucose-Capillary 92 65 - 99 mg/dL  Glucose, capillary     Status: Abnormal   Collection Time: 07/29/16  2:37 AM  Result Value Ref Range   Glucose-Capillary 100 (H) 65 - 99 mg/dL  Glucose, capillary     Status: None   Collection Time: 07/29/16  3:34 AM  Result Value Ref Range   Glucose-Capillary 93 65 - 99 mg/dL  Glucose, capillary     Status: None   Collection Time: 07/29/16  4:32 AM  Result Value Ref Range   Glucose-Capillary 94 65 - 99 mg/dL  Triglycerides     Status: Abnormal   Collection Time: 07/29/16  6:30 AM  Result Value Ref Range   Triglycerides 1,979 (H) <150 mg/dL  Basic metabolic panel     Status: Abnormal   Collection Time: 07/29/16  6:30 AM  Result Value Ref Range   Sodium  135  135 - 145 mmol/L   Potassium 2.9 (L) 3.5 - 5.1 mmol/L   Chloride 99 (L) 101 - 111 mmol/L   CO2 27 22 - 32 mmol/L   Glucose, Bld 322 (H) 65 - 99 mg/dL   BUN <5 (L) 6 - 20 mg/dL   Creatinine, Ser 0.79 0.61 - 1.24 mg/dL   Calcium 8.1 (L) 8.9 - 10.3 mg/dL   GFR calc non Af Amer >60 >60 mL/min   GFR calc Af Amer >60 >60 mL/min   Anion gap 9 5 - 15  TSH     Status: None   Collection Time: 07/29/16  6:30 AM  Result Value Ref Range   TSH 0.507 0.350 - 4.500 uIU/mL  Magnesium     Status: None   Collection Time: 07/29/16  6:30 AM  Result Value Ref Range   Magnesium 1.8 1.7 - 2.4 mg/dL  Glucose, capillary     Status: Abnormal   Collection Time: 07/29/16  6:32 AM  Result Value Ref Range   Glucose-Capillary 100 (H) 65 - 99 mg/dL  Glucose, capillary     Status: None   Collection Time: 07/29/16  7:37 AM  Result Value Ref Range   Glucose-Capillary 96 65 - 99 mg/dL   Comment 1 Document in Chart   Glucose, capillary     Status: None   Collection Time: 07/29/16  9:07 AM  Result Value Ref Range   Glucose-Capillary 85 65 - 99 mg/dL  Glucose, capillary     Status: Abnormal   Collection Time: 07/29/16 10:34 AM  Result Value Ref Range   Glucose-Capillary 114 (H) 65 - 99 mg/dL   Comment 1 Document in Chart   Glucose, capillary     Status: Abnormal   Collection Time: 07/29/16 12:34 PM  Result Value Ref Range   Glucose-Capillary 127 (H) 65 - 99 mg/dL   Comment 1 Document in Chart      Assessment 1.Acute pancreatitis due to hypertriglyceridemia. 2.Familial hypertriglyceridemia 3.Type 2 diabetes   4.    Diplopia  Plan 1.   Continue IVF and IV insulin. Recommend that we continue IV insulin rate of 12 units/hr and dose of D10 be adjusted to keep sugars in the 80-150 range.   2.   Triglyceride level improved. Trend triglycerides daily and continue IV insulin until serum Tg level <1000. 3.   Keep NPO except sugar free popsicles. Continue fibrate, and fish oil for now.  4.   He should  restart his insulin pump once Tg level <1000. He has all supplies for the pump at his bedside. No need to overlap with IV insulin, rather IV insulin can be stopped as soon as the pump is started. Instructed patient to do a 50% "temp basal" for initial 24 hrs that he starts the pump as I'm concerned about risk of fasting hypoglycemia since he likely has limited glycogen reserves after a 4-5 day fast. He expressed understanding. 5.   Once Tg level <1000 and insulin pump started, then consider starting a sugar free liquid diet initially, to be advanced as tolerated. Would not continue to trend Tg level at that point. He likely will be ready for DC about 24 hrs later if tolerating PO. 6.   Will order statin restarted tonight. 7.   For persistent diplopia, may need opthalmology follow up after discharge.   Endocrinology inpatient consult service will not be available again until Monday 10/23.  Patient should follow up in clinic in about 3 weeks.

## 2016-07-29 NOTE — Progress Notes (Signed)
Dr. Anselm Jungling notified of lab work that was sent to [facility] was lipemic.  Per Dr. Anselm Jungling lab work to be reordered was lipemia has resolved.

## 2016-07-30 LAB — BASIC METABOLIC PANEL
ANION GAP: 8 (ref 5–15)
Anion gap: 6 (ref 5–15)
CALCIUM: 8.6 mg/dL — AB (ref 8.9–10.3)
CALCIUM: 8.8 mg/dL — AB (ref 8.9–10.3)
CO2: 27 mmol/L (ref 22–32)
CO2: 28 mmol/L (ref 22–32)
CREATININE: 0.87 mg/dL (ref 0.61–1.24)
Chloride: 101 mmol/L (ref 101–111)
Chloride: 99 mmol/L — ABNORMAL LOW (ref 101–111)
Creatinine, Ser: 0.85 mg/dL (ref 0.61–1.24)
GFR calc Af Amer: >60 mL/min (ref >60)
GFR calc Af Amer: >60 mL/min (ref >60)
GLUCOSE: 103 mg/dL — AB (ref 65–99)
GLUCOSE: 119 mg/dL — AB (ref 65–99)
Potassium: 3.7 mmol/L (ref 3.5–5.1)
Potassium: 3.9 mmol/L (ref 3.5–5.1)
SODIUM: 136 mmol/L (ref 135–145)
Sodium: 133 mmol/L — ABNORMAL LOW (ref 135–145)

## 2016-07-30 LAB — GLUCOSE, CAPILLARY
GLUCOSE-CAPILLARY: 144 mg/dL — AB (ref 65–99)
GLUCOSE-CAPILLARY: 116 mg/dL — AB (ref 65–99)
GLUCOSE-CAPILLARY: 95 mg/dL (ref 65–99)
GLUCOSE-CAPILLARY: 104 mg/dL — AB (ref 65–99)
GLUCOSE-CAPILLARY: 61 mg/dL — AB (ref 65–99)
GLUCOSE-CAPILLARY: 97 mg/dL (ref 65–99)
GLUCOSE-CAPILLARY: 116 mg/dL — AB (ref 65–99)
GLUCOSE-CAPILLARY: 108 mg/dL — AB (ref 65–99)
Glucose-Capillary: 104 mg/dL — ABNORMAL HIGH (ref 65–99)
Glucose-Capillary: 104 mg/dL — ABNORMAL HIGH (ref 65–99)
Glucose-Capillary: 106 mg/dL — ABNORMAL HIGH (ref 65–99)
Glucose-Capillary: 107 mg/dL — ABNORMAL HIGH (ref 65–99)
Glucose-Capillary: 109 mg/dL — ABNORMAL HIGH (ref 65–99)
Glucose-Capillary: 111 mg/dL — ABNORMAL HIGH (ref 65–99)
Glucose-Capillary: 113 mg/dL — ABNORMAL HIGH (ref 65–99)
Glucose-Capillary: 119 mg/dL — ABNORMAL HIGH (ref 65–99)
Glucose-Capillary: 121 mg/dL — ABNORMAL HIGH (ref 65–99)
Glucose-Capillary: 125 mg/dL — ABNORMAL HIGH (ref 65–99)
Glucose-Capillary: 151 mg/dL — ABNORMAL HIGH (ref 65–99)
Glucose-Capillary: 92 mg/dL (ref 65–99)

## 2016-07-30 LAB — PHOSPHORUS: Phosphorus: 4.4 mg/dL (ref 2.5–4.6)

## 2016-07-30 LAB — MAGNESIUM: Magnesium: 1.9 mg/dL (ref 1.7–2.4)

## 2016-07-30 LAB — TRIGLYCERIDES: TRIGLYCERIDES: 1764 mg/dL — AB (ref <150)

## 2016-07-30 MED ORDER — POTASSIUM CHLORIDE CRYS ER 20 MEQ PO TBCR
40.0000 meq | EXTENDED_RELEASE_TABLET | Freq: Once | ORAL | Status: AC
  Administered 2016-07-30: 40 meq via ORAL
  Filled 2016-07-30: qty 2

## 2016-07-30 MED ORDER — POTASSIUM CHLORIDE CRYS ER 20 MEQ PO TBCR
20.0000 meq | EXTENDED_RELEASE_TABLET | Freq: Once | ORAL | Status: AC
  Administered 2016-07-30: 20 meq via ORAL
  Filled 2016-07-30: qty 1

## 2016-07-30 MED ORDER — MAGNESIUM SULFATE 2 GM/50ML IV SOLN
2.0000 g | Freq: Once | INTRAVENOUS | Status: AC
  Administered 2016-07-30: 2 g via INTRAVENOUS
  Filled 2016-07-30: qty 50

## 2016-07-30 MED ORDER — OXYCODONE HCL 5 MG PO TABS
10.0000 mg | ORAL_TABLET | ORAL | Status: DC | PRN
  Administered 2016-07-30 – 2016-08-04 (×15): 10 mg via ORAL
  Filled 2016-07-30 (×15): qty 2

## 2016-07-30 NOTE — Progress Notes (Signed)
[place] for Electrolyte Management Indication: Insulin Drip for hypertriglyceridemia   40 y/o M with a h/o familial hypertriglyceridemia, related pancreatitis, DM, HTN,  and obesity on insulin pump admitted with elevated TG. Patient currently ordered insulin drip at 12 units/hr and D10%/0.45NS at 150 mL/hr.   Plan:  K = 3.9 this evening, which is slightly below goal. Will give KCl 20 mEq PO x 1 dose.  Goal potassium > 4 while on insulin drip therapy.    Will continue to obtain electrolytes every twelve hours while patient is on insulin infusion.     Allergies  Allergen Reactions  . Codeine Anaphylaxis  . Ivp Dye [Iodinated Diagnostic Agents] Other (See Comments)    Kidneys stop working    Patient Measurements: Height: 5\' 11"  (180.3 cm) Weight: 242 lb 15.2 oz (110.2 kg) IBW/kg (Calculated) : 75.3  Vital Signs: Temp: 97.6 F (36.4 C) (10/20 1400) Temp Source: Oral (10/20 1400) BP: 145/92 (10/20 1600) Pulse Rate: 71 (10/20 1600) Intake/Output from previous day: 10/19 0701 - 10/20 0700 In: 5000 [I.V.:4050; IV Piggyback:50] Out: [id number] [Urine:3800] Intake/Output from this shift: No intake/output data recorded.  Labs:  Recent Labs  07/28/16 0333  07/29/16 0630 07/29/16 1744 07/30/16 0545 07/30/16 1922  CREATININE 0.83  < > 0.79 0.79 0.85 0.87  MG 1.8  --  1.8  --  1.9  --   PHOS  --   --   --  4.1 4.4  --   < > = values in this interval not displayed. Estimated Creatinine Clearance: 145.4 mL/min (by C-G formula based on SCr of 0.87 mg/dL).  Pharmacy will continue to monitor and adjust per consult.    Lenis Noon, PharmD Clinical Pharmacist 07/30/2016,8:00 PM

## 2016-07-30 NOTE — Progress Notes (Signed)
[place] for Electrolyte Management Indication: Insulin Drip for hypertriglyceridemia   40 y/o M with a h/o familial hypertriglyceridemia, related pancreatitis, DM, HTN,  and obesity on insulin pump admitted with elevated TG. Patient currently ordered insulin drip at 12 units/hr and D10%/0.45NS at 150 mL/hr. Patient initiated on Potassium 30mEq PO BID. Patient received potassium 44mEq IV x 1 and 7mEq PO x 1.   Plan:  Will order potasium 48mEq PO x 1 and magnesium 2g IV x 1. Goal potassium > 4 while on insulin drip therapy.    Patient with episode of hypoglycemia, treated via protocol. Will transition rate of D10%/0.45NS to 175 mL/hr.  Will continue to obtain electrolytes every twelve hours while patient is on insulin infusion.     Allergies  Allergen Reactions  . Codeine Anaphylaxis  . Ivp Dye [Iodinated Diagnostic Agents] Other (See Comments)    Kidneys stop working    Patient Measurements: Height: 5\' 11"  (180.3 cm) Weight: 242 lb 15.2 oz (110.2 kg) IBW/kg (Calculated) : 75.3  Vital Signs: Temp: 97.4 F (36.3 C) (10/20 0800) Temp Source: Axillary (10/20 0800) BP: 137/93 (10/20 0800) Pulse Rate: 60 (10/20 0800) Intake/Output from previous day: 10/19 0701 - 10/20 0700 In: 5000 [I.V.:4050; IV Piggyback:50] Out: [id number] [Urine:3800] Intake/Output from this shift: Total I/O In: 173 [P.O.:120; I.V.:3; IV Piggyback:50] Out: 1600 [Urine:1600]  Labs:  Recent Labs  07/28/16 0333  07/29/16 0630 07/29/16 1744 07/30/16 0545  CREATININE 0.83  < > 0.79 0.79 0.85  MG 1.8  --  1.8  --  1.9  PHOS  --   --   --  4.1 4.4  < > = values in this interval not displayed. Estimated Creatinine Clearance: 148.8 mL/min (by C-G formula based on SCr of 0.85 mg/dL).  Pharmacy will continue to monitor and adjust per consult.    Cyniah Gossard L, 07/30/2016,11:39 AM

## 2016-07-30 NOTE — Progress Notes (Signed)
[facility] [place] NAME: Sergio [name]    MR#:  [id number]  DATE OF BIRTH:  August 19, 1976  SUBJECTIVE:  CHIEF COMPLAINT:   Chief Complaint  Patient presents with  . Abdominal Pain    Came with hypertriglyceridemia, TG was > 5000.   Had c/o double vision , no other complains of numbness or weakness.  he have urinary retention, foley placed.   MRI brain and MRA are negative.   C/o pain in epigastric today.  REVIEW OF SYSTEMS:   CONSTITUTIONAL: No fever, fatigue or weakness.  EYES: No blurred, but have double vision.  EARS, NOSE, AND THROAT: No tinnitus or ear pain.  RESPIRATORY: No cough, shortness of breath, wheezing or hemoptysis.  CARDIOVASCULAR: No chest pain, orthopnea, edema.  GASTROINTESTINAL: No nausea, vomiting, diarrhea , have abdominal pain.  GENITOURINARY: No dysuria, hematuria.  ENDOCRINE: No polyuria, nocturia,  HEMATOLOGY: No anemia, easy bruising or bleeding SKIN: No rash or lesion. MUSCULOSKELETAL: No joint pain or arthritis.   NEUROLOGIC: No tingling, numbness, weakness.  PSYCHIATRY: No anxiety or depression.   ROS  DRUG ALLERGIES:   Allergies  Allergen Reactions  . Codeine Anaphylaxis  . Ivp Dye [Iodinated Diagnostic Agents] Other (See Comments)    Kidneys stop working    VITALS:  Blood pressure (!) 137/92, pulse 70, temperature 97.6 F (36.4 C), temperature source Oral, resp. rate 18, height 5\' 11"  (1.803 m), weight 110.2 kg (242 lb 15.2 oz), SpO2 95 %.  PHYSICAL EXAMINATION:   GENERAL:  40 y.o.-year-old patient lying in the bed with no acute distress.  EYES: Pupils equal, round, reactive to light and accommodation. No scleral icterus. Extraocular muscles intact.  HEENT: Head atraumatic, normocephalic. Oropharynx and nasopharynx clear.  NECK:  Supple, no jugular venous distention. No thyroid enlargement, no tenderness.  LUNGS: Normal breath sounds bilaterally, no wheezing, rales,rhonchi or crepitation. No  use of accessory muscles of respiration.  CARDIOVASCULAR: S1, S2 normal. No murmurs, rubs, or gallops.  ABDOMEN: Soft,  Mild tender, nondistended. Bowel sounds present. No organomegaly or mass.  EXTREMITIES: No pedal edema, cyanosis, or clubbing.  NEUROLOGIC: Cranial nerves II through XII are intact except left 6th nerve. Muscle strength 5/5 in all extremities. Sensation intact. Gait not checked.  PSYCHIATRIC: The patient is alert and oriented x 3.  SKIN: No obvious rash, lesion, or ulcer.   Physical Exam LABORATORY PANEL:   CBC  Recent Labs Lab 07/25/16 0452  WBC 5.7  HGB 13.0  HCT 37.9*  PLT 217   ------------------------------------------------------------------------------------------------------------------  Chemistries   Recent Labs Lab 07/25/16 0452  07/30/16 0545  NA 138  < > 136  K 3.5  < > 3.7  CL 105  < > 101  CO2 24  < > 27  GLUCOSE 133*  < > 103*  BUN 10  < > <5*  CREATININE 0.66  < > 0.85  CALCIUM 8.6*  < > 8.6*  MG  --   < > 1.9  AST 18  --   --   ALT 19  --   --   ALKPHOS 50  --   --   BILITOT 1.0  --   --   < > = values in this interval not displayed. ------------------------------------------------------------------------------------------------------------------  Cardiac Enzymes No results for input(s): TROPONINI in the last 168 hours. ------------------------------------------------------------------------------------------------------------------  RADIOLOGY:  No results found.  ASSESSMENT AND PLAN:   Active Problems:   Hypertriglyceridemia   Chronic pancreatitis (HCC)   Hyponatremia  Hypokalemia   Sixth nerve palsy of left eye  #1, hypertriglyceridemia,    insulin IV drip, follow triglycerides daily, continue Lopid and omega-3 fatty acids,    Appreciated Dr.Solum's help to manage hypertriglyceridemia.   On D10 drip + high dose insulin drip.   TG slightly better now. #2 chronic pancreatitis exacerbation, supportive therapy with IV  fluids, pain medications   Tried to try long acting oxycodone- but pt have more pain, cont dilaudid and added short acting oxycodone.  pt want to try full liquid diet now. #3. Hyponatremia, IV fluids with normal saline, follow sodium level in the morning #4. Hypokalemia, supplement intravenously, check magnesium level, supplemented as needed   Now added K to IV fluids. #5. BPH, advance patient's finasteride and Flomax, per outpatient urologist recommendations    He still had retention today, and > 500 ml urine on bladder scan - called urology consult.    As he had similar complains in past, will monitor with foley and try to remove it near end of his stay.   This happens mostly with dilaudid as per pt, so will give oral oxycodone to decrease use of dilaudid and see. Failed trial of long acting oxycodone, try with short acting oxycodone. #6. Diabetes mellitus, continue insulin IV drip for now, endocrinologist consultation is appreciated,     Need to start back on insulin pump once TG is under control.   Dr. Gabriel Carina explained him about the reduced setting. #7 Double vision   CT head negative, Did MRI brain and MRA - negative- appreciated neurology consult.   Eye patch to help.   Special tests ordered , can not be done due to lipemic serum   Suggested to give vit b12.  Due to requirement of frequent blood sugar monitoring and insulin drip , he is in step down unit, may transfer to floor - if possible to provide this care.  All the records are reviewed and case discussed with Care Management/Social Workerr. Management plans discussed with the patient, family and they are in agreement.  CODE STATUS: full  TOTAL TIME TAKING CARE OF THIS PATIENT: 35 minutes.  Discussed with his wife in room.  POSSIBLE D/C IN 2-3 DAYS, DEPENDING ON CLINICAL CONDITION.   Vaughan Basta M.D on 07/30/2016   Between 7am to 6pm - Pager - [phone]  After 6pm go to [email or web address] - password [email or web address]  [facility]  Office  [phone]  CC: Primary care physician; Crecencio Mc, MD  Note: This dictation was prepared with Dragon dictation along with smaller phrase technology. Any transcriptional errors that result from this process are unintentional.

## 2016-07-31 LAB — GLUCOSE, CAPILLARY
GLUCOSE-CAPILLARY: 112 mg/dL — AB (ref 65–99)
GLUCOSE-CAPILLARY: 127 mg/dL — AB (ref 65–99)
GLUCOSE-CAPILLARY: 112 mg/dL — AB (ref 65–99)
GLUCOSE-CAPILLARY: 114 mg/dL — AB (ref 65–99)
GLUCOSE-CAPILLARY: 132 mg/dL — AB (ref 65–99)
GLUCOSE-CAPILLARY: 56 mg/dL — AB (ref 65–99)
GLUCOSE-CAPILLARY: 67 mg/dL (ref 65–99)
GLUCOSE-CAPILLARY: 110 mg/dL — AB (ref 65–99)
GLUCOSE-CAPILLARY: 109 mg/dL — AB (ref 65–99)
Glucose-Capillary: 102 mg/dL — ABNORMAL HIGH (ref 65–99)
Glucose-Capillary: 104 mg/dL — ABNORMAL HIGH (ref 65–99)
Glucose-Capillary: 105 mg/dL — ABNORMAL HIGH (ref 65–99)
Glucose-Capillary: 116 mg/dL — ABNORMAL HIGH (ref 65–99)
Glucose-Capillary: 117 mg/dL — ABNORMAL HIGH (ref 65–99)
Glucose-Capillary: 118 mg/dL — ABNORMAL HIGH (ref 65–99)
Glucose-Capillary: 122 mg/dL — ABNORMAL HIGH (ref 65–99)
Glucose-Capillary: 55 mg/dL — ABNORMAL LOW (ref 65–99)
Glucose-Capillary: 82 mg/dL (ref 65–99)
Glucose-Capillary: 94 mg/dL (ref 65–99)

## 2016-07-31 LAB — BASIC METABOLIC PANEL
Anion gap: 5 (ref 5–15)
CO2: 29 mmol/L (ref 22–32)
CREATININE: 0.86 mg/dL (ref 0.61–1.24)
Calcium: 8.7 mg/dL — ABNORMAL LOW (ref 8.9–10.3)
Chloride: 103 mmol/L (ref 101–111)
Glucose, Bld: 121 mg/dL — ABNORMAL HIGH (ref 65–99)
POTASSIUM: 3.6 mmol/L (ref 3.5–5.1)
SODIUM: 137 mmol/L (ref 135–145)

## 2016-07-31 LAB — TRIGLYCERIDES: TRIGLYCERIDES: 1470 mg/dL — AB (ref <150)

## 2016-07-31 MED ORDER — SODIUM CHLORIDE 0.9 % IV SOLN: INTRAVENOUS | Status: DC

## 2016-07-31 MED ORDER — SODIUM CHLORIDE 0.9 % IV SOLN
12.0000 [IU]/h | INTRAVENOUS | Status: DC
  Administered 2016-08-02 – 2016-08-03 (×2): 12 [IU]/h via INTRAVENOUS
  Filled 2016-07-31 (×3): qty 2.5

## 2016-07-31 MED ORDER — POTASSIUM CHLORIDE 10 MEQ/100ML IV SOLN
10.0000 meq | INTRAVENOUS | Status: AC
  Administered 2016-07-31 (×5): 10 meq via INTRAVENOUS
  Filled 2016-07-31 (×5): qty 100

## 2016-07-31 MED ORDER — HYDROMORPHONE HCL 1 MG/ML IJ SOLN
1.0000 mg | INTRAMUSCULAR | Status: DC | PRN
  Administered 2016-07-31 – 2016-08-01 (×6): 1 mg via INTRAVENOUS
  Filled 2016-07-31 (×6): qty 1

## 2016-07-31 NOTE — Progress Notes (Signed)
Pt's CBG 55mg /dL. Pt is conscious and alert, states that he does not feel like his sugar is low. Pt requested a popsicle at this time. Will re-check CBG in 15 minutes and treat as needed.

## 2016-07-31 NOTE — Progress Notes (Signed)
[facility] [place] NAME: Sergio [name]    MR#:  [id number]  DATE OF BIRTH:  Sep 07, 1976  SUBJECTIVE:  CHIEF COMPLAINT:   Chief Complaint  Patient presents with  . Abdominal Pain    Came with hypertriglyceridemia, TG was > 5000.   Had c/o double vision , no other complains of numbness or weakness.  he have urinary retention, foley placed.   MRI brain and MRA are negative.  Abdominal pain 6/10, no nausea or vomiting. Still double vision.  REVIEW OF SYSTEMS:   CONSTITUTIONAL: No fever, fatigue or weakness.  EYES: No blurred, but have double vision.  EARS, NOSE, AND THROAT: No tinnitus or ear pain.  RESPIRATORY: No cough, shortness of breath, wheezing or hemoptysis.  CARDIOVASCULAR: No chest pain, orthopnea, edema.  GASTROINTESTINAL: No nausea, vomiting, diarrhea , have abdominal pain.  GENITOURINARY: No dysuria, hematuria.  ENDOCRINE: No polyuria, nocturia,  HEMATOLOGY: No anemia, easy bruising or bleeding SKIN: No rash or lesion. MUSCULOSKELETAL: No joint pain or arthritis.   NEUROLOGIC: No tingling, numbness, weakness.  PSYCHIATRY: No anxiety or depression.   ROS  DRUG ALLERGIES:   Allergies  Allergen Reactions  . Codeine Anaphylaxis  . Ivp Dye [Iodinated Diagnostic Agents] Other (See Comments)    Kidneys stop working    VITALS:  Blood pressure 108/71, pulse 71, temperature 97.8 F (36.6 C), temperature source Oral, resp. rate (!) 8, height 5\' 11"  (1.803 m), weight 242 lb 15.2 oz (110.2 kg), SpO2 96 %.  PHYSICAL EXAMINATION:   GENERAL:  40 y.o.-year-old patient lying in the bed with no acute distress.  EYES: Pupils equal, round, reactive to light and accommodation. No scleral icterus. Extraocular muscles intact.  HEENT: Head atraumatic, normocephalic. Oropharynx and nasopharynx clear.  NECK:  Supple, no jugular venous distention. No thyroid enlargement, no tenderness.  LUNGS: Normal breath sounds bilaterally, no  wheezing, rales,rhonchi or crepitation. No use of accessory muscles of respiration.  CARDIOVASCULAR: S1, S2 normal. No murmurs, rubs, or gallops.  ABDOMEN: Soft,  Mild tenderness, nondistended. Bowel sounds present. No organomegaly or mass.  EXTREMITIES: No pedal edema, cyanosis, or clubbing.  NEUROLOGIC: Cranial nerves II through XII are intact except left 6th nerve. Muscle strength 5/5 in all extremities. Sensation intact. Gait not checked.  PSYCHIATRIC: The patient is alert and oriented x 3.  SKIN: No obvious rash, lesion, or ulcer.   Physical Exam LABORATORY PANEL:   CBC  Recent Labs Lab 07/25/16 0452  WBC 5.7  HGB 13.0  HCT 37.9*  PLT 217   ------------------------------------------------------------------------------------------------------------------  Chemistries   Recent Labs Lab 07/25/16 0452  07/30/16 0545  07/31/16 0549  NA 138  < > 136  < > 137  K 3.5  < > 3.7  < > 3.6  CL 105  < > 101  < > 103  CO2 24  < > 27  < > 29  GLUCOSE 133*  < > 103*  < > 121*  BUN 10  < > <5*  < > <5*  CREATININE 0.66  < > 0.85  < > 0.86  CALCIUM 8.6*  < > 8.6*  < > 8.7*  MG  --   < > 1.9  --   --   AST 18  --   --   --   --   ALT 19  --   --   --   --   ALKPHOS 50  --   --   --   --  BILITOT 1.0  --   --   --   --   < > = values in this interval not displayed. ------------------------------------------------------------------------------------------------------------------  Cardiac Enzymes No results for input(s): TROPONINI in the last 168 hours. ------------------------------------------------------------------------------------------------------------------  RADIOLOGY:  No results found.  ASSESSMENT AND PLAN:   Active Problems:   Hypertriglyceridemia   Chronic pancreatitis (HCC)   Hyponatremia   Hypokalemia   Sixth nerve palsy of left eye  #1, hypertriglyceridemia,    insulin IV drip, follow triglycerides daily, continue Lopid and omega-3 fatty acids,     Appreciated Dr.Solum's help to manage hypertriglyceridemia.   On D10 drip + high dose insulin drip.   TG is better today, but still >1000. [name] #2 chronic pancreatitis exacerbation, supportive therapy with IV fluids, pain medications   Tried to try long acting oxycodone- but pt have more pain, cont dilaudid and added short acting oxycodone.  pt tried full liquid diet but ate a little.  #3. Hyponatremia,  improved with IV fluids with normal saline.  #4. Hypokalemia, improved with supplement intravenously.  #5. BPH, advance patient's finasteride and Flomax, per outpatient urologist recommendations    He still had retention > 500 ml urine on bladder scan - called urology consult.    As he had similar complains in past,  Continue foley and try to remove it near end of his stay.   This happens mostly with dilaudid as per pt, continue oral oxycodone and try to taper dilaudid.  #6. Diabetes mellitus, continue insulin IV drip for now, endocrinologist consultation is appreciated,     Need to start back on insulin pump once TG is under control.   Dr. Gabriel Carina explained him about the reduced setting.  #7 Double vision   CT head negative, Did MRI brain and MRA - negative- appreciated neurology consult.   Eye patch to help.   Special tests ordered , can not be done due to lipemic serum   Suggested to give vit b12.  All the records are reviewed and case discussed with Care Management/Social Workerr. Management plans discussed with the patient, family and they are in agreement.  CODE STATUS: full  TOTAL TIME TAKING CARE OF THIS PATIENT: 37 minutes.  Discussed with his wife in room.  POSSIBLE D/C IN 2-3 DAYS, DEPENDING ON CLINICAL CONDITION.   Demetrios Loll M.D on 07/31/2016   Between 7am to 6pm - Pager - [phone]  After 6pm go to [email or web address] - password [email or web address] [facility]  Office  [phone]  CC: Primary care physician; Crecencio Mc, MD  Note: This dictation  was prepared with Dragon dictation along with smaller phrase technology. Any transcriptional errors that result from this process are unintentional.

## 2016-07-31 NOTE — Plan of Care (Signed)
Problem: Pain Managment: Goal: General experience of comfort will improve Outcome: Progressing Pt w/improved pain management.  Time between IV pain medication doses extending.  Pt sleeping well throughout the night and not requiring any IV pain medication when being woken up for q1h CBG monitoring.

## 2016-07-31 NOTE — Progress Notes (Signed)
[facility] for Electrolyte Management Indication: Insulin Drip for hypertriglyceridemia   40 y/o M with a h/o familial hypertriglyceridemia, related pancreatitis, DM, HTN,  and obesity on insulin pump admitted with elevated TG. Patient currently ordered insulin drip at 12 units/hr and D10%/0.45NS at 150 mL/hr.   Plan:  K = 3.6 this evening, which is slightly below goal. Will give KCl 10  mEq PO x 5 dose.  Goal potassium > 4 while on insulin drip therapy.   Will recheck K with am labs.        Allergies  Allergen Reactions  . Codeine Anaphylaxis  . Ivp Dye [Iodinated Diagnostic Agents] Other (See Comments)    Kidneys stop working    Patient Measurements: Height: 5\' 11"  (180.3 cm) Weight: 242 lb 15.2 oz (110.2 kg) IBW/kg (Calculated) : 75.3  Vital Signs: Temp: 97.8 F (36.6 C) (10/21 0800) Temp Source: Oral (10/21 0800) BP: 102/72 (10/21 0800) Pulse Rate: 65 (10/21 0800) Intake/Output from previous day: 10/20 0701 - 10/21 0700 In: 4524.4 [P.O.:240; I.V.:4234.4; IV Piggyback:50] Out: [id number] [Urine:5250] Intake/Output from this shift: Total I/O In: 477.2 [I.V.:477.2] Out: -   Labs:  Recent Labs  07/29/16 0630 07/29/16 1744 07/30/16 0545 07/30/16 1922 07/31/16 0549  CREATININE 0.79 0.79 0.85 0.87 0.86  MG 1.8  --  1.9  --   --   PHOS  --  4.1 4.4  --   --    Estimated Creatinine Clearance: 147.1 mL/min (by C-G formula based on SCr of 0.86 mg/dL).  Pharmacy will continue to monitor and adjust per consult.    Larene Beach, PharmD Clinical Pharmacist 07/31/2016,10:16 AM

## 2016-08-01 LAB — GLUCOSE, CAPILLARY
GLUCOSE-CAPILLARY: 102 mg/dL — AB (ref 65–99)
GLUCOSE-CAPILLARY: 112 mg/dL — AB (ref 65–99)
GLUCOSE-CAPILLARY: 116 mg/dL — AB (ref 65–99)
GLUCOSE-CAPILLARY: 118 mg/dL — AB (ref 65–99)
GLUCOSE-CAPILLARY: 119 mg/dL — AB (ref 65–99)
GLUCOSE-CAPILLARY: 124 mg/dL — AB (ref 65–99)
GLUCOSE-CAPILLARY: 130 mg/dL — AB (ref 65–99)
GLUCOSE-CAPILLARY: 131 mg/dL — AB (ref 65–99)
GLUCOSE-CAPILLARY: 136 mg/dL — AB (ref 65–99)
GLUCOSE-CAPILLARY: 144 mg/dL — AB (ref 65–99)
GLUCOSE-CAPILLARY: 144 mg/dL — AB (ref 65–99)
Glucose-Capillary: 122 mg/dL — ABNORMAL HIGH (ref 65–99)
Glucose-Capillary: 117 mg/dL — ABNORMAL HIGH (ref 65–99)
Glucose-Capillary: 117 mg/dL — ABNORMAL HIGH (ref 65–99)
Glucose-Capillary: 121 mg/dL — ABNORMAL HIGH (ref 65–99)
Glucose-Capillary: 76 mg/dL (ref 65–99)
Glucose-Capillary: 95 mg/dL (ref 65–99)
Glucose-Capillary: 66 mg/dL (ref 65–99)
Glucose-Capillary: 160 mg/dL — ABNORMAL HIGH (ref 65–99)

## 2016-08-01 LAB — BASIC METABOLIC PANEL
ANION GAP: 6 (ref 5–15)
BUN: <5 mg/dL — ABNORMAL LOW (ref 6–20)
CHLORIDE: 99 mmol/L — AB (ref 101–111)
CO2: 30 mmol/L (ref 22–32)
Calcium: 8.9 mg/dL (ref 8.9–10.3)
Creatinine, Ser: 0.97 mg/dL (ref 0.61–1.24)
GFR calc non Af Amer: >60 mL/min (ref >60)
Glucose, Bld: 121 mg/dL — ABNORMAL HIGH (ref 65–99)
Potassium: 3.8 mmol/L (ref 3.5–5.1)
Sodium: 135 mmol/L (ref 135–145)

## 2016-08-01 LAB — MAGNESIUM: Magnesium: 1.8 mg/dL (ref 1.7–2.4)

## 2016-08-01 LAB — TRIGLYCERIDES: Triglycerides: 1135 mg/dL — ABNORMAL HIGH (ref <150)

## 2016-08-01 LAB — POTASSIUM: POTASSIUM: 4 mmol/L (ref 3.5–5.1)

## 2016-08-01 MED ORDER — POTASSIUM CHLORIDE 10 MEQ/100ML IV SOLN
10.0000 meq | INTRAVENOUS | Status: AC
  Administered 2016-08-01 (×5): 10 meq via INTRAVENOUS
  Filled 2016-08-01 (×5): qty 100

## 2016-08-01 NOTE — Progress Notes (Signed)
[facility] for Electrolyte Management Indication: Insulin Drip for hypertriglyceridemia   40 y/o M with a h/o familial hypertriglyceridemia, related pancreatitis, DM, HTN,  and obesity on insulin pump admitted with elevated TG. Patient currently ordered insulin drip at 12 units/hr and D10%/0.45NS at 150 mL/hr.   Plan:  K = 3.8 this evening, which is slightly below goal. Will give KCl 10  mEq IV x 5 dose.  Goal potassium > 4 while on insulin drip therapy.   Will recheck K @ 1800.        Allergies  Allergen Reactions  . Codeine Anaphylaxis  . Ivp Dye [Iodinated Diagnostic Agents] Other (See Comments)    Kidneys stop working    Patient Measurements: Height: 5\' 11"  (180.3 cm) Weight: 244 lb 0.8 oz (110.7 kg) IBW/kg (Calculated) : 75.3  Vital Signs: Temp: 97.9 F (36.6 C) (10/22 0800) Temp Source: Oral (10/22 0800) BP: 143/99 (10/22 0800) Pulse Rate: 67 (10/22 0800) Intake/Output from previous day: 10/21 0701 - 10/22 0700 In: 4511.8 [I.V.:4511.8] Out: 5425 [Urine:5425] Intake/Output from this shift: Total I/O In: 374 [I.V.:374] Out: 225 [Urine:225]  Labs:  Recent Labs  07/29/16 1744 07/30/16 0545 07/30/16 1922 07/31/16 0549 08/01/16 0530  CREATININE 0.79 0.85 0.87 0.86 0.97  MG  --  1.9  --   --   --   PHOS 4.1 4.4  --   --   --    Estimated Creatinine Clearance: 130.7 mL/min (by C-G formula based on SCr of 0.97 mg/dL).  Pharmacy will continue to monitor and adjust per consult.    Larene Beach, PharmD Clinical Pharmacist 08/01/2016,9:28 AM

## 2016-08-01 NOTE — Progress Notes (Signed)
[facility] for Electrolyte Management Indication: Insulin Drip for hypertriglyceridemia   40 y/o M with a h/o familial hypertriglyceridemia, related pancreatitis, DM, HTN,  and obesity on insulin pump admitted with elevated TG. Patient currently ordered insulin drip at 12 units/hr and D10%/0.45NS at 150 mL/hr.   Plan:  K = 3.8 this evening, which is slightly below goal. Will give KCl 10  mEq IV x 5 dose.  Goal potassium > 4 while on insulin drip therapy.   Will recheck K @ 1800.        Allergies  Allergen Reactions  . Codeine Anaphylaxis  . Ivp Dye [Iodinated Diagnostic Agents] Other (See Comments)    Kidneys stop working    Patient Measurements: Height: 5\' 11"  (180.3 cm) Weight: 244 lb 0.8 oz (110.7 kg) IBW/kg (Calculated) : 75.3  Vital Signs: Temp: 98.6 F (37 C) (10/22 1945) Temp Source: Oral (10/22 1945) BP: 131/93 (10/22 1600) Pulse Rate: 107 (10/22 1800) Intake/Output from previous day: 10/21 0701 - 10/22 0700 In: 4511.8 [I.V.:4511.8] Out: 5425 [Urine:5425] Intake/Output from this shift: No intake/output data recorded.  Labs:  Recent Labs  07/30/16 0545 07/30/16 1922 07/31/16 0549 08/01/16 0530  CREATININE 0.85 0.87 0.86 0.97  MG 1.9  --   --  1.8  PHOS 4.4  --   --   --    Estimated Creatinine Clearance: 130.7 mL/min (by C-G formula based on SCr of 0.97 mg/dL).  Pharmacy will continue to monitor and adjust per consult.    10/22:  K @ 17:44 = 4.0 No additional K supplementation needed.  Will recheck electrolytes on 10/23 with AM labs.   Orene Desanctis, PharmD Clinical Pharmacist 08/01/2016,8:45 PM

## 2016-08-01 NOTE — Progress Notes (Signed)
[facility] [place] NAME: Sergio [name]    MR#:  [id number]  DATE OF BIRTH:  07/15/1976  SUBJECTIVE:  CHIEF COMPLAINT:   Chief Complaint  Patient presents with  . Abdominal Pain    Came with hypertriglyceridemia, TG was > 5000.   Had c/o double vision , no other complains of numbness or weakness.  he have urinary retention, foley placed.   MRI brain and MRA are negative.  Still abdominal pain 6/10, want dilaudid round clock, no nausea or vomiting. Still double vision.  REVIEW OF SYSTEMS:   CONSTITUTIONAL: No fever, fatigue or weakness.  EYES: No blurred, but have double vision.  EARS, NOSE, AND THROAT: No tinnitus or ear pain.  RESPIRATORY: No cough, shortness of breath, wheezing or hemoptysis.  CARDIOVASCULAR: No chest pain, orthopnea, edema.  GASTROINTESTINAL: No nausea, vomiting, diarrhea , have abdominal pain.  GENITOURINARY: No dysuria, hematuria.  ENDOCRINE: No polyuria, nocturia,  HEMATOLOGY: No anemia, easy bruising or bleeding SKIN: No rash or lesion. MUSCULOSKELETAL: No joint pain or arthritis.   NEUROLOGIC: No tingling, numbness, weakness.  PSYCHIATRY: No anxiety or depression.   ROS  DRUG ALLERGIES:   Allergies  Allergen Reactions  . Codeine Anaphylaxis  . Ivp Dye [Iodinated Diagnostic Agents] Other (See Comments)    Kidneys stop working    VITALS:  Blood pressure (!) 125/93, pulse 67, temperature 97.9 F (36.6 C), temperature source Oral, resp. rate (!) 8, height 5\' 11"  (1.803 m), weight 244 lb 0.8 oz (110.7 kg), SpO2 98 %.  PHYSICAL EXAMINATION:   GENERAL:  40 y.o.-year-old patient lying in the bed with no acute distress.  EYES: Pupils equal, round, reactive to light and accommodation. No scleral icterus. Extraocular muscles intact.  HEENT: Head atraumatic, normocephalic. Oropharynx and nasopharynx clear.  NECK:  Supple, no jugular venous distention. No thyroid enlargement, no tenderness.  LUNGS: Normal  breath sounds bilaterally, no wheezing, rales,rhonchi or crepitation. No use of accessory muscles of respiration.  CARDIOVASCULAR: S1, S2 normal. No murmurs, rubs, or gallops.  ABDOMEN: Soft,  Mild tenderness, nondistended. Bowel sounds present. No organomegaly or mass.  EXTREMITIES: No pedal edema, cyanosis, or clubbing.  NEUROLOGIC: Cranial nerves II through XII are intact except left 6th nerve. Muscle strength 5/5 in all extremities. Sensation intact. Gait not checked.  PSYCHIATRIC: The patient is alert and oriented x 3.  SKIN: No obvious rash, lesion, or ulcer.   Physical Exam LABORATORY PANEL:   CBC No results for input(s): WBC, HGB, HCT, PLT in the last 168 hours. ------------------------------------------------------------------------------------------------------------------  Chemistries   Recent Labs Lab 08/01/16 0530  NA 135  K 3.8  CL 99*  CO2 30  GLUCOSE 121*  BUN <5*  CREATININE 0.97  CALCIUM 8.9  MG 1.8   ------------------------------------------------------------------------------------------------------------------  Cardiac Enzymes No results for input(s): TROPONINI in the last 168 hours. ------------------------------------------------------------------------------------------------------------------  RADIOLOGY:  No results found.  ASSESSMENT AND PLAN:   Active Problems:   Hypertriglyceridemia   Chronic pancreatitis (HCC)   Hyponatremia   Hypokalemia   Sixth nerve palsy of left eye  #1, hypertriglyceridemia,    insulin IV drip, follow triglycerides daily, continue Lopid and omega-3 fatty acids,    Appreciated Dr.Solum's help to manage hypertriglyceridemia.  Continue D10 drip + high dose insulin drip.   TG is better today, but still >1000 (1135). . #2 chronic pancreatitis exacerbation, supportive therapy with IV fluids, pain medications   Tried to try long acting oxycodone- but pt have more pain, try  to taper down dilaudid and continue short  acting oxycodone.  pt tried full liquid diet but ate a little.  #3. Hyponatremia,  improved with IV fluids with normal saline.  #4. Hypokalemia, improved with supplement intravenously.  #5. BPH, advance patient's finasteride and Flomax, per outpatient urologist recommendations    He had retention > 500 ml urine on bladder scan - called urology consult.    As he had similar complains in past,  Continue foley and try to remove it near end of his stay.   This happens mostly with dilaudid as per pt, continue oral oxycodone and try to taper dilaudid.  #6. Diabetes mellitus, continue insulin IV drip for now, endocrinologist consultation is appreciated,     Need to start back on insulin pump once TG is under control.   Dr. Gabriel Carina explained him about the reduced setting.  #7 Double vision   CT head negative, Did MRI brain and MRA - negative- appreciated neurology consult.   Eye patch to help.   Special tests ordered , can not be done due to lipemic serum   Suggested to give vit b12.  All the records are reviewed and case discussed with Care Management/Social Workerr. Management plans discussed with the patient, his father and they are in agreement.  CODE STATUS: full  TOTAL TIME TAKING CARE OF THIS PATIENT: 39 minutes.  Discussed with his wife in room.  POSSIBLE D/C IN 2 DAYS, DEPENDING ON CLINICAL CONDITION.   Demetrios Loll M.D on 08/01/2016   Between 7am to 6pm - Pager - [phone]  After 6pm go to [email or web address] - password [email or web address] [facility]  Office  [phone]  CC: Primary care physician; Crecencio Mc, MD  Note: This dictation was prepared with Dragon dictation along with smaller phrase technology. Any transcriptional errors that result from this process are unintentional.

## 2016-08-02 LAB — BASIC METABOLIC PANEL
Anion gap: 5 (ref 5–15)
BUN: 5 mg/dL — AB (ref 6–20)
CHLORIDE: 102 mmol/L (ref 101–111)
CO2: 29 mmol/L (ref 22–32)
CREATININE: 0.84 mg/dL (ref 0.61–1.24)
Calcium: 9 mg/dL (ref 8.9–10.3)
GFR calc Af Amer: >60 mL/min (ref >60)
GFR calc non Af Amer: >60 mL/min (ref >60)
GLUCOSE: 139 mg/dL — AB (ref 65–99)
POTASSIUM: 3.5 mmol/L (ref 3.5–5.1)
SODIUM: 136 mmol/L (ref 135–145)

## 2016-08-02 LAB — GLUCOSE, CAPILLARY
GLUCOSE-CAPILLARY: 110 mg/dL — AB (ref 65–99)
GLUCOSE-CAPILLARY: 140 mg/dL — AB (ref 65–99)
GLUCOSE-CAPILLARY: 141 mg/dL — AB (ref 65–99)
GLUCOSE-CAPILLARY: 141 mg/dL — AB (ref 65–99)
GLUCOSE-CAPILLARY: 104 mg/dL — AB (ref 65–99)
GLUCOSE-CAPILLARY: 114 mg/dL — AB (ref 65–99)
GLUCOSE-CAPILLARY: 98 mg/dL (ref 65–99)
GLUCOSE-CAPILLARY: 99 mg/dL (ref 65–99)
GLUCOSE-CAPILLARY: 96 mg/dL (ref 65–99)
GLUCOSE-CAPILLARY: 111 mg/dL — AB (ref 65–99)
GLUCOSE-CAPILLARY: 100 mg/dL — AB (ref 65–99)
GLUCOSE-CAPILLARY: 114 mg/dL — AB (ref 65–99)
Glucose-Capillary: 136 mg/dL — ABNORMAL HIGH (ref 65–99)
Glucose-Capillary: 146 mg/dL — ABNORMAL HIGH (ref 65–99)
Glucose-Capillary: 110 mg/dL — ABNORMAL HIGH (ref 65–99)
Glucose-Capillary: 104 mg/dL — ABNORMAL HIGH (ref 65–99)
Glucose-Capillary: 115 mg/dL — ABNORMAL HIGH (ref 65–99)
Glucose-Capillary: 115 mg/dL — ABNORMAL HIGH (ref 65–99)
Glucose-Capillary: 115 mg/dL — ABNORMAL HIGH (ref 65–99)
Glucose-Capillary: 104 mg/dL — ABNORMAL HIGH (ref 65–99)
Glucose-Capillary: 109 mg/dL — ABNORMAL HIGH (ref 65–99)

## 2016-08-02 LAB — TRIGLYCERIDES: TRIGLYCERIDES: 1122 mg/dL — AB (ref <150)

## 2016-08-02 MED ORDER — POTASSIUM CHLORIDE CRYS ER 20 MEQ PO TBCR
40.0000 meq | EXTENDED_RELEASE_TABLET | Freq: Three times a day (TID) | ORAL | Status: AC
  Administered 2016-08-02 (×3): 40 meq via ORAL
  Filled 2016-08-02 (×3): qty 2

## 2016-08-02 NOTE — Progress Notes (Signed)
Patient alert and oriented. No complaints of pain. Per [name] continue insulin drips. Blood sugars checked hourly. Per Dr Bridgett Larsson keep foley in per patient's request. Patient visited with family in lobby per Dr Joycie Peek request. Patient resting comfortably.

## 2016-08-02 NOTE — Progress Notes (Signed)
[facility] for Electrolyte Management Indication: Insulin Drip for hypertriglyceridemia   40 y/o M with a h/o familial hypertriglyceridemia, related pancreatitis, DM, HTN,  and obesity on insulin pump admitted with elevated TG. Patient currently ordered insulin drip at 12 units/hr and D10%/0.45NS at 175 mL/hr.   Plan:  Will order potassium chloride 40 mEq PO TID x 3 doses. Will recheck potassium at 1800 and all other electrolytes with am labs.       Allergies  Allergen Reactions  . Codeine Anaphylaxis  . Ivp Dye [Iodinated Diagnostic Agents] Other (See Comments)    Kidneys stop working    Patient Measurements: Height: 5\' 11"  (180.3 cm) Weight: 244 lb 0.8 oz (110.7 kg) IBW/kg (Calculated) : 75.3  Vital Signs: Temp: 98.3 F (36.8 C) (10/23 0700) BP: 135/90 (10/23 0800) Pulse Rate: 76 (10/23 0800) Intake/Output from previous day: 10/22 0701 - 10/23 0700 In: 2244 [I.V.:2244] Out: 5100 [Urine:5100] Intake/Output from this shift: No intake/output data recorded.  Labs:  Recent Labs  07/31/16 0549 08/01/16 0530 08/02/16 0213  CREATININE 0.86 0.97 0.84  MG  --  1.8  --    Estimated Creatinine Clearance: 150.9 mL/min (by C-G formula based on SCr of 0.84 mg/dL).  Pharmacy will continue to monitor and adjust per consult.    Catori Panozzo L 08/02/2016,8:46 AM

## 2016-08-02 NOTE — Progress Notes (Signed)
Endocrinology follow up  REFERRING PHYSICIAN:  Myrla Halsted, MD CONSULTING PHYSICIAN:  A. Lavone Orn, MD. PRIMARY CARE PROVIDER: [name], MD.  History of present illness Sergio BELFLOWER is a 40 y.o. male seen in follow up for type 2 diabetes and familial hypertriglyceridemia with recurrent acute pancreatitis due to hypertriglyceridemia. He was admitted on 07/25/16 with acute pancreatitis.  Now receiving insulin 12 units/hr and D10 with 0.45%NS at 175 cc/hr. Fingerstick blood sugars have been in the 98-146 range today. Tg level improved at 1122. He reports persistent blurred vision. Now wearing a single eye patch which is helping. Has a sense of "pressure" on left eye. Abd pain improved, 0/10 with pain medications. Now on PO pain medications only. Denies N/V. Appetite still low. He is getting a meal tray which he doesn't eat.   Medical history Past Medical History:  Diagnosis Date  . Anxiety   . Chicken pox   . Depression   . Diabetes mellitus   . Familial hypertriglyceridemia    severe  . Gastric ulcer 2009  . HTN (hypertension)   . Morbid obesity ([place])    s/p bariatric sleeve surgery 01/2015  . Recurrent acute pancreatitis    secondary to hypertriglyceridemia   . Sleep apnea 1999   uses CPAP     Surgical history Past Surgical History:  Procedure Laterality Date  . CHOLECYSTECTOMY    . LAPAROSCOPIC GASTRIC SLEEVE RESECTION    . [place] SURGERY  2008  . TONSILLECTOMY  2003  . VASECTOMY       Medications . docusate sodium  100 mg Oral BID  . enoxaparin (LOVENOX) injection  40 mg Subcutaneous Q24H  . finasteride  5 mg Oral Daily  . gemfibrozil  600 mg Oral BID AC  . omega-3 acid ethyl esters  4 g Oral QHS  . pneumococcal 23 valent vaccine  0.5 mL Intramuscular Tomorrow-1000  . potassium chloride  40 mEq Oral TID  . pregabalin  300 mg Oral QHS  . rosuvastatin  10 mg Oral q1800  . sertraline  100 mg Oral Daily  . sodium chloride flush  3 mL Intravenous Q12H   . tamsulosin  0.8 mg Oral Daily  . traZODone  100 mg Oral QHS  . vitamin B-12  1,000 mcg Oral Daily     Social history Social History  Substance Use Topics  . Smoking status: Never Smoker  . Smokeless tobacco: Never Used  . Alcohol use No     Comment: once month     Family history Family History  Problem Relation Age of Onset  . Adopted: Yes  . Hypertension Mother   . Hyperlipidemia Mother   . Hypertension Father   . Hyperlipidemia Father     Review of systems CV: no chest pain or palpitations PULM: no cough or shortness of breath ABD: Good appetite.    Physical Exam BP 118/80   Pulse 77   Temp 98.3 F (36.8 C)   Resp 16   Ht 5\' 11"  (1.803 m)   Wt 110.7 kg (244 lb 0.8 oz)   SpO2 92%   BMI 34.04 kg/m   GEN: white male in NAD. HEENT: No proptosis, lid lag or stare. Oropharynx is clear.  NECK: supple, trachea midline.   RESPIRATORY: clear bilaterally, no wheeze, good inspiratory effort. CV: No carotid bruits, RRR. MUSCULOSKELETAL: no peripheral edema, no clubbing, no tremor ABD: soft, no epigastric TTP, NABS SKIN: +few xanthoma in the arms, unchanged LYMPH: no submandibular or supraclavicular  LAD NEURO: PERRL, EOMI PSYC: alert and oriented, good insight  Labs Results for orders placed or performed during the hospital encounter of 07/24/16 (from the past 24 hour(s))  Glucose, capillary     Status: Abnormal   Collection Time: 08/01/16  1:44 PM  Result Value Ref Range   Glucose-Capillary 116 (H) 65 - 99 mg/dL  Glucose, capillary     Status: None   Collection Time: 08/01/16  2:49 PM  Result Value Ref Range   Glucose-Capillary 95 65 - 99 mg/dL  Glucose, capillary     Status: None   Collection Time: 08/01/16  5:04 PM  Result Value Ref Range   Glucose-Capillary 66 65 - 99 mg/dL  Potassium     Status: None   Collection Time: 08/01/16  5:44 PM  Result Value Ref Range   Potassium 4.0 3.5 - 5.1 mmol/L  Glucose, capillary     Status: Abnormal   Collection  Time: 08/01/16  5:59 PM  Result Value Ref Range   Glucose-Capillary 160 (H) 65 - 99 mg/dL  Glucose, capillary     Status: Abnormal   Collection Time: 08/01/16  7:50 PM  Result Value Ref Range   Glucose-Capillary 124 (H) 65 - 99 mg/dL  Glucose, capillary     Status: Abnormal   Collection Time: 08/01/16  8:57 PM  Result Value Ref Range   Glucose-Capillary 119 (H) 65 - 99 mg/dL  Glucose, capillary     Status: Abnormal   Collection Time: 08/01/16  9:59 PM  Result Value Ref Range   Glucose-Capillary 130 (H) 65 - 99 mg/dL  Glucose, capillary     Status: Abnormal   Collection Time: 08/01/16 11:58 PM  Result Value Ref Range   Glucose-Capillary 144 (H) 65 - 99 mg/dL  Glucose, capillary     Status: Abnormal   Collection Time: 08/02/16  1:37 AM  Result Value Ref Range   Glucose-Capillary 140 (H) 65 - 99 mg/dL  Triglycerides     Status: Abnormal   Collection Time: 08/02/16  2:13 AM  Result Value Ref Range   Triglycerides 1,122 (H) <150 mg/dL  Basic metabolic panel     Status: Abnormal   Collection Time: 08/02/16  2:13 AM  Result Value Ref Range   Sodium 136 135 - 145 mmol/L   Potassium 3.5 3.5 - 5.1 mmol/L   Chloride 102 101 - 111 mmol/L   CO2 29 22 - 32 mmol/L   Glucose, Bld 139 (H) 65 - 99 mg/dL   BUN 5 (L) 6 - 20 mg/dL   Creatinine, Ser 0.84 0.61 - 1.24 mg/dL   Calcium 9.0 8.9 - 10.3 mg/dL   GFR calc non Af Amer >60 >60 mL/min   GFR calc Af Amer >60 >60 mL/min   Anion gap 5 5 - 15  Glucose, capillary     Status: Abnormal   Collection Time: 08/02/16  2:53 AM  Result Value Ref Range   Glucose-Capillary 136 (H) 65 - 99 mg/dL  Glucose, capillary     Status: Abnormal   Collection Time: 08/02/16  3:59 AM  Result Value Ref Range   Glucose-Capillary 146 (H) 65 - 99 mg/dL  Glucose, capillary     Status: Abnormal   Collection Time: 08/02/16  5:06 AM  Result Value Ref Range   Glucose-Capillary 141 (H) 65 - 99 mg/dL  Glucose, capillary     Status: Abnormal   Collection Time: 08/02/16   7:00 AM  Result Value Ref Range   Glucose-Capillary  141 (H) 65 - 99 mg/dL  Glucose, capillary     Status: Abnormal   Collection Time: 08/02/16  7:56 AM  Result Value Ref Range   Glucose-Capillary 110 (H) 65 - 99 mg/dL  Glucose, capillary     Status: Abnormal   Collection Time: 08/02/16  8:56 AM  Result Value Ref Range   Glucose-Capillary 104 (H) 65 - 99 mg/dL  Glucose, capillary     Status: Abnormal   Collection Time: 08/02/16 10:09 AM  Result Value Ref Range   Glucose-Capillary 104 (H) 65 - 99 mg/dL  Glucose, capillary     Status: Abnormal   Collection Time: 08/02/16 10:37 AM  Result Value Ref Range   Glucose-Capillary 114 (H) 65 - 99 mg/dL  Glucose, capillary     Status: None   Collection Time: 08/02/16 11:18 AM  Result Value Ref Range   Glucose-Capillary 98 65 - 99 mg/dL  Glucose, capillary     Status: None   Collection Time: 08/02/16 12:22 PM  Result Value Ref Range   Glucose-Capillary 99 65 - 99 mg/dL     Assessment 1.Acute pancreatitis due to hypertriglyceridemia. 2.Familial hypertriglyceridemia 3.Type 2 diabetes   4.   Diplopia  Plan 1.   Continue IVF and IV insulin. Recommend that we continue IV insulin rate of 12 units/hr and dose of D10 be adjusted to keep sugars in the 80-150 range.  Currently rate of IV dextrose with IVF at 175 cc/hr. Advise that if rate of insulin were inc'd that he'd require more IVF and more electrolyte replacement and this change and its ensuing risks are not necessary as his Tg level is steadily improving. 2.   Triglyceride level improved. Trend triglycerides daily and continue IV insulin until serum Tg level <1000. 3.   Keep NPO except sugar free popsicles. Will discontinue the full liquid diet. Continue statin, fibrate, and fish oil for now.  4.   Again reviewed with patient that our plan is to restart his insulin pump once Tg level <1000. He has all supplies for the pump at his bedside. No need to overlap with IV insulin, rather IV  insulin can be stopped as soon as the pump is started. Instructed patient to do a 50% "temp basal" for initial 24 hrs that he starts the pump as I'm concerned about risk of fasting hypoglycemia since he likely has limited glycogen reserves after a 4-5 day fast. He expressed understanding. 5.   Once Tg level <1000 and insulin pump started, then consider starting a sugar free liquid diet initially, to be advanced as tolerated. No diet orders needed till then. Once they are put in, it must be stipulated that he be given SUGAR FREE liquids only. Would not continue to trend Tg level at that point.   Will follow along.

## 2016-08-02 NOTE — Progress Notes (Signed)
Per [name] allow patient to go to visitors waiting room to visit with wife and three daughters. Called E-link to advise that patient was leaving room.

## 2016-08-02 NOTE — Progress Notes (Addendum)
Inpatient Diabetes Program Recommendations  AACE/ADA: New Consensus Statement on Inpatient Glycemic Control (2015)  Target Ranges:  Prepandial:   less than 140 mg/dL      Peak postprandial:   less than 180 mg/dL (1-2 hours)      Critically ill patients:  140 - 180 mg/dL    Spoke with pt this AM.  Reconfirmed plan that pt is to resume his insulin pump once his Triglyceride level is less than 1000.  Patient told me he has all of his insulin pump supplies with him.  Also knows to use reduced basal rate at 1st (when he does resume insulin pump) per Dr. Joycie Peek orders.  Triglycerides 1122 mg/dl this AM, so not ready to stop Insulin drip yet.  Patient was concerned that his Triglycerides are still not down below 1000 mg/dl.  Asked me if we could increase the Insulin drip rate to bring the Triglycerides levels down faster.  I told him I did not have the answer for that question but that he should likely address that question with Dr. Gabriel Carina.      --Will follow patient during hospitalization--  Wyn Quaker RN, MSN, CDE Diabetes Coordinator Inpatient Glycemic Control Team Team Pager: [phone] (8a-5p)

## 2016-08-02 NOTE — Progress Notes (Signed)
Sergio [name] at [place] NAME: [name]    MR#:  [id number]  DATE OF BIRTH:  Mar 29, 1976  SUBJECTIVE:  CHIEF COMPLAINT:   Chief Complaint  Patient presents with  . Abdominal Pain    Came with hypertriglyceridemia, TG was > 5000.   Had c/o double vision , no other complains of numbness or weakness.  he have urinary retention, foley placed.   MRI brain and MRA are negative.  No abdominal pain, no nausea or vomiting. Still double vision.  REVIEW OF SYSTEMS:   CONSTITUTIONAL: No fever, fatigue or weakness.  EYES: No blurred, but have double vision.  EARS, NOSE, AND THROAT: No tinnitus or ear pain.  RESPIRATORY: No cough, shortness of breath, wheezing or hemoptysis.  CARDIOVASCULAR: No chest pain, orthopnea, edema.  GASTROINTESTINAL: No nausea, vomiting, diarrhea , no abdominal pain.  GENITOURINARY: No dysuria, hematuria.  ENDOCRINE: No polyuria, nocturia,  HEMATOLOGY: No anemia, easy bruising or bleeding SKIN: No rash or lesion. MUSCULOSKELETAL: No joint pain or arthritis.   NEUROLOGIC: No tingling, numbness, weakness.  PSYCHIATRY: No anxiety or depression.   ROS  DRUG ALLERGIES:   Allergies  Allergen Reactions  . Codeine Anaphylaxis  . Ivp Dye [Iodinated Diagnostic Agents] Other (See Comments)    Kidneys stop working    VITALS:  Blood pressure (!) 129/92, pulse 75, temperature 98.3 F (36.8 C), resp. rate 10, height 5\' 11"  (1.803 m), weight 244 lb 0.8 oz (110.7 kg), SpO2 94 %.  PHYSICAL EXAMINATION:   GENERAL:  40 y.o.-year-old patient lying in the bed with no acute distress.  EYES: Pupils equal, round, reactive to light and accommodation. No scleral icterus. Extraocular muscles intact.  HEENT: Head atraumatic, normocephalic. Oropharynx and nasopharynx clear.  NECK:  Supple, no jugular venous distention. No thyroid enlargement, no tenderness.  LUNGS: Normal breath sounds bilaterally, no wheezing, rales,rhonchi or  crepitation. No use of accessory muscles of respiration.  CARDIOVASCULAR: S1, S2 normal. No murmurs, rubs, or gallops.  ABDOMEN: Soft,  Mild tenderness, nondistended. Bowel sounds present. No organomegaly or mass.  EXTREMITIES: No pedal edema, cyanosis, or clubbing.  NEUROLOGIC: Cranial nerves II through XII are intact except left 6th nerve. Muscle strength 5/5 in all extremities. Sensation intact. Gait not checked.  PSYCHIATRIC: The patient is alert and oriented x 3.  SKIN: No obvious rash, lesion, or ulcer.   Physical Exam LABORATORY PANEL:   CBC No results for input(s): WBC, HGB, HCT, PLT in the last 168 hours. ------------------------------------------------------------------------------------------------------------------  Chemistries   Recent Labs Lab 08/01/16 0530  08/02/16 0213  NA 135  --  136  K 3.8  < > 3.5  CL 99*  --  102  CO2 30  --  29  GLUCOSE 121*  --  139*  BUN <5*  --  5*  CREATININE 0.97  --  0.84  CALCIUM 8.9  --  9.0  MG 1.8  --   --   < > = values in this interval not displayed. ------------------------------------------------------------------------------------------------------------------  Cardiac Enzymes No results for input(s): TROPONINI in the last 168 hours. ------------------------------------------------------------------------------------------------------------------  RADIOLOGY:  No results found.  ASSESSMENT AND PLAN:   Active Problems:   Hypertriglyceridemia   Chronic pancreatitis (HCC)   Hyponatremia   Hypokalemia   Sixth nerve palsy of left eye  #1, hypertriglyceridemia,   continue insulin IV drip, follow triglycerides daily, continue Lopid and omega-3 fatty acids,    Appreciated Dr.Solum's help to manage hypertriglyceridemia.  Continue D10 drip +  high dose insulin drip at the same rate and NPO per Dr. Gabriel Carina.   TG is better today, but still >1000 (1122). . #2 chronic pancreatitis exacerbation, supportive therapy with IV  fluids, pain medications   Tried to try long acting oxycodone- but pt have more pain, try to taper down dilaudid and continue short acting oxycodone. NPO.  #3. Hyponatremia,  improved with IV fluids with normal saline.  #4. Hypokalemia, improved with supplement intravenously.  #5. BPH, advance patient's finasteride and Flomax, per outpatient urologist recommendations    He had retention > 500 ml urine on bladder scan - called urology consult.    As he had similar complains in past,  Continue foley and try to remove it near end of his stay.   This happens mostly with dilaudid as per pt, continue oral oxycodone and try to taper dilaudid.  #6. Diabetes mellitus, continue insulin IV drip for now, endocrinologist consultation is appreciated,     Need to start back on insulin pump once TG is under control.   Dr. Gabriel Carina explained him about the reduced setting.  #7 Double vision   CT head negative, Did MRI brain and MRA - negative- appreciated neurology consult.   Eye patch to help.   Special tests ordered , can not be done due to lipemic serum   Suggested to give vit b12.  All the records are reviewed and case discussed with Care Management/Social Workerr. Management plans discussed with the patient, his father and they are in agreement.  CODE STATUS: full  TOTAL TIME TAKING CARE OF THIS PATIENT: 37 minutes.  Discussed with his wife in room.  POSSIBLE D/C IN 2 DAYS, DEPENDING ON CLINICAL CONDITION.   Demetrios Loll M.D on 08/02/2016   Between 7am to 6pm - Pager - [phone]  After 6pm go to [email or web address] - password [email or web address]  Office  [phone]  CC: Primary care physician; Crecencio Mc, MD  Note: This dictation was prepared with Dragon dictation along with smaller phrase technology. Any transcriptional errors that result from this process are unintentional.

## 2016-08-03 LAB — GLUCOSE, CAPILLARY
GLUCOSE-CAPILLARY: 103 mg/dL — AB (ref 65–99)
GLUCOSE-CAPILLARY: 116 mg/dL — AB (ref 65–99)
GLUCOSE-CAPILLARY: 88 mg/dL (ref 65–99)
GLUCOSE-CAPILLARY: 95 mg/dL (ref 65–99)
Glucose-Capillary: 103 mg/dL — ABNORMAL HIGH (ref 65–99)
Glucose-Capillary: 96 mg/dL (ref 65–99)
Glucose-Capillary: 93 mg/dL (ref 65–99)
Glucose-Capillary: 67 mg/dL (ref 65–99)

## 2016-08-03 LAB — BASIC METABOLIC PANEL
ANION GAP: 5 (ref 5–15)
BUN: 6 mg/dL (ref 6–20)
CHLORIDE: 105 mmol/L (ref 101–111)
CO2: 28 mmol/L (ref 22–32)
Calcium: 9.1 mg/dL (ref 8.9–10.3)
Creatinine, Ser: 0.97 mg/dL (ref 0.61–1.24)
GFR calc Af Amer: >60 mL/min (ref >60)
GLUCOSE: 98 mg/dL (ref 65–99)
POTASSIUM: 3.6 mmol/L (ref 3.5–5.1)
Sodium: 138 mmol/L (ref 135–145)

## 2016-08-03 LAB — MAGNESIUM: Magnesium: 1.7 mg/dL (ref 1.7–2.4)

## 2016-08-03 LAB — TRIGLYCERIDES: Triglycerides: 829 mg/dL — ABNORMAL HIGH (ref <150)

## 2016-08-03 MED ORDER — INSULIN PUMP
Freq: Three times a day (TID) | SUBCUTANEOUS | Status: DC
  Administered 2016-08-03: 22.5 via SUBCUTANEOUS
  Administered 2016-08-03: 1 via SUBCUTANEOUS
  Administered 2016-08-03: 13.9 via SUBCUTANEOUS
  Administered 2016-08-04: 4.3 via SUBCUTANEOUS
  Administered 2016-08-04: 2 via SUBCUTANEOUS
  Administered 2016-08-04: 1 via SUBCUTANEOUS
  Filled 2016-08-03: qty 1

## 2016-08-03 NOTE — Progress Notes (Signed)
Initial Nutrition Assessment  DOCUMENTATION CODES:   Obesity unspecified  INTERVENTION:  -Recommend pt restarting MVI, calcium, Vitamin D and iron supplements as these are recommend indefinitly in patients who undergo gastric sleeve surgery due to increased risk of nutrient deficiencies. Pt reports he is noncompliant with regards to this and has not been compliant with regards to follow-up with MD Bruce (bariatric surgeon); encouraged pt to follow-up with MD -Pt declined further diet education at this time; pt has been educated in the past and has no diet related questions at this time -Diet advancement as tolerated, encourage adequate protein and fluid intake, moderation of carbohydrate and fat intake  NUTRITION DIAGNOSIS:   Inadequate oral intake related to acute illness as evidenced by estimated needs.  GOAL:   Patient will meet greater than or equal to 90% of their needs  MONITOR:   Diet advancement, PO intake, Labs, Weight trends  REASON FOR ASSESSMENT:   LOS    ASSESSMENT:    40 yo male admitted with acute on chronic pancreatitis due to severe familial hypertriglyceridemia. Pt also with hx of DM with insulin pump, hx of gastric sleeve in 2016.  TG >5000 on admission requiring insulin drip. TG 829 today and pt transitioned to insulin pump.   Pt reports pain well controlled today. Pt ate some of FL tray today as well; pt upset on visit that there are not more sugar free items available at the hospital. Sugar free popsicles brought in by wife per MD Solum order. Pt reports appetite has been good at home and weight has been stable. Pt reports he has been compliant with diet and has no diet related questions at this time. Noted pt has order for B12 supplement but not MVI, calcium, vitamin D, etc related to gastric sleeve surgery.  Tolerating FL diet on visit today  Nutrition-Focused physical exam completed. Findings are WDL for fat depletion, muscle depletion, and edema.     Past Medical History:  Diagnosis Date  . Anxiety   . Chicken pox   . Depression   . Diabetes mellitus   . Familial hypertriglyceridemia    severe  . Gastric ulcer 2009  . HTN (hypertension)   . Morbid obesity ([place])    s/p bariatric sleeve surgery 01/2015  . Recurrent acute pancreatitis    secondary to hypertriglyceridemia   . Sleep apnea 1999   uses CPAP     Diet Order:  Diet full liquid Room service appropriate? Yes; Fluid consistency: Thin  Skin:  Reviewed, no issues  Last BM:  10/23  Labs: TG 829  Glucose Profile:   Recent Labs  08/03/16 0608 08/03/16 0657 08/03/16 1129  GLUCAP 88 96 93    Lab Results  Component Value Date   HGBA1C 7.9 (H) 07/24/2016   Meds: crestor, lopid, lovaza, B12, insulin pump  Height:   Ht Readings from Last 1 Encounters:  07/24/16 5\' 11"  (1.803 m)    Weight:   Wt Readings from Last 1 Encounters:  08/01/16 244 lb 0.8 oz (110.7 kg)   Filed Weights   07/24/16 1053 07/24/16 2245 08/01/16 0500  Weight: 250 lb (113.4 kg) 242 lb 15.2 oz (110.2 kg) 244 lb 0.8 oz (110.7 kg)    BMI:  Body mass index is 34.04 kg/m.  Estimated Nutritional Needs:   Kcal:  [id number] kcals  Protein:  >/= 110 g  Fluid:  >/= 2.5 L  EDUCATION NEEDS:   No education needs identified at this time  Kerman Passey MS,  RD, LDN [phone] Pager  [phone] Weekend/On-Call Pager

## 2016-08-03 NOTE — Progress Notes (Signed)
PT ALERT AND ORIENTED X 4, IN ROOM AIR, TRIGLYCERIDES 828, INSULIN DRIP AND FLUID DISCONTINUED. TOLERATED DIET, FOLEY REMOVED AT 0930 PER MD ORDER. PER MD SOLUM, INSTRUCTED PT TO DO A 50% TEMP. BASAL FOR FIRST 24 HOURS WHICH IS 1.3 UNITS PER HOUR.

## 2016-08-03 NOTE — Progress Notes (Signed)
Inpatient Diabetes Program Recommendations  AACE/ADA: New Consensus Statement on Inpatient Glycemic Control (2015)  Target Ranges:  Prepandial:   less than 140 mg/dL      Peak postprandial:   less than 180 mg/dL (1-2 hours)      Critically ill patients:  140 - 180 mg/dL   Results for Sergio [name], Sergio [name] (MRN [id number]) as of 08/03/2016 07:43  Ref. Range 08/03/2016 03:54  Triglycerides Latest Ref Range: <150 mg/dL 829 (H)     Note Triglycerides are down to 829 mg/dl this AM.  Note plan for pt to resume insulin pump once Triglycerides are <1000 mg/dl.      MD- Please place Insulin Pump Order Set when patient resumes his insulin pump today.  Can assist with order entry if needed.     --Will follow patient during hospitalization--  Wyn Quaker RN, MSN, CDE Diabetes Coordinator Inpatient Glycemic Control Team Team Pager: [phone] (8a-5p)

## 2016-08-03 NOTE — Progress Notes (Signed)
Sergio [name] at [facility] NAME: Sergio [name]    MR#:  [id number]  DATE OF BIRTH:  1976-04-22  SUBJECTIVE:  CHIEF COMPLAINT:   Chief Complaint  Patient presents with  . Abdominal Pain    Came with hypertriglyceridemia, TG was > 5000.   Had c/o double vision , no other complains of numbness or weakness.  he have urinary retention, foley placed.   MRI brain and MRA are negative.   Still double vision.  REVIEW OF SYSTEMS:   CONSTITUTIONAL: No fever, fatigue or weakness.  EYES: No blurred, but have double vision.  EARS, NOSE, AND THROAT: No tinnitus or ear pain.  RESPIRATORY: No cough, shortness of breath, wheezing or hemoptysis.  CARDIOVASCULAR: No chest pain, orthopnea, edema.  GASTROINTESTINAL: No nausea, vomiting, diarrhea , no abdominal pain.  GENITOURINARY: No dysuria, hematuria.  ENDOCRINE: No polyuria, nocturia,  HEMATOLOGY: No anemia, easy bruising or bleeding SKIN: No rash or lesion. MUSCULOSKELETAL: No joint pain or arthritis.   NEUROLOGIC: No tingling, numbness, weakness.  PSYCHIATRY: No anxiety or depression.   ROS  DRUG ALLERGIES:   Allergies  Allergen Reactions  . Codeine Anaphylaxis  . Ivp Dye [Iodinated Diagnostic Agents] Other (See Comments)    Kidneys stop working    VITALS:  Blood pressure 137/90, pulse 66, temperature 98 F (36.7 C), temperature source Oral, resp. rate 18, height 5\' 11"  (1.803 m), weight 244 lb 0.8 oz (110.7 kg), SpO2 97 %.  PHYSICAL EXAMINATION:   GENERAL:  40 y.o.-year-old patient lying in the bed with no acute distress.  EYES: Pupils equal, round, reactive to light and accommodation. No scleral icterus. Extraocular muscles intact.  HEENT: Head atraumatic, normocephalic. Oropharynx and nasopharynx clear.  NECK:  Supple, no jugular venous distention. No thyroid enlargement, no tenderness.  LUNGS: Normal breath sounds bilaterally, no wheezing, rales,rhonchi or crepitation. No use of accessory  muscles of respiration.  CARDIOVASCULAR: S1, S2 normal. No murmurs, rubs, or gallops.  ABDOMEN: Soft,  Mild tenderness, nondistended. Bowel sounds present. No organomegaly or mass.  EXTREMITIES: No pedal edema, cyanosis, or clubbing.  NEUROLOGIC: Cranial nerves II through XII are intact except left 6th nerve. Muscle strength 5/5 in all extremities. Sensation intact. Gait not checked.  PSYCHIATRIC: The patient is alert and oriented x 3.  SKIN: No obvious rash, lesion, or ulcer.   Physical Exam LABORATORY PANEL:   CBC No results for input(s): WBC, HGB, HCT, PLT in the last 168 hours. ------------------------------------------------------------------------------------------------------------------  Chemistries   Recent Labs Lab 08/03/16 0354  NA 138  K 3.6  CL 105  CO2 28  GLUCOSE 98  BUN 6  CREATININE 0.97  CALCIUM 9.1  MG 1.7   ------------------------------------------------------------------------------------------------------------------  Cardiac Enzymes No results for input(s): TROPONINI in the last 168 hours. ------------------------------------------------------------------------------------------------------------------  RADIOLOGY:  No results found.  ASSESSMENT AND PLAN:   Active Problems:   Hypertriglyceridemia   Chronic pancreatitis (HCC)   Hyponatremia   Hypokalemia   Sixth nerve palsy of left eye  #1, hypertriglyceridemia,   discontinued insulin IV drip this am since TG<1000, continue Lopid and omega-3 fatty acids,    Appreciated Dr.Solum's help to manage hypertriglyceridemia.  discontinued D10 drip + high dose insulin drip and started insulin pump per Dr. Joycie [name] recommendation.   TG is 892. Start Free sugar liquid diet. . #2 chronic pancreatitis exacerbation, supportive therapy with IV fluids, pain medications   Tried to try long acting oxycodone- but pt have more pain, try to taper down dilaudid  and continue short acting oxycodone. Free sugar  liquid diet.  #3. Hyponatremia,  improved with IV fluids with normal saline.  #4. Hypokalemia, improved with supplement intravenously.  #5. BPH, advance patient's finasteride and Flomax, per outpatient urologist recommendations    He had retention > 500 ml urine on bladder scan - called urology consult.    As he had similar complains in past,  Continue foley and try to remove it near end of his stay.   This happens mostly with dilaudid as per pt, continue oral oxycodone and try to taper dilaudid. Discontinue Foley catheter.  #6. Diabetes mellitus, continue insulin IV drip for now, endocrinologist consultation is appreciated,     Need to start back on insulin pump once TG is under control.   Dr. Gabriel [name] explained him about the reduced setting. start insulin pump protocol.   #7 Double vision   CT head negative, Did MRI brain and MRA - negative- appreciated neurology consult.   Eye patch to help.   Special tests ordered , could not be done due to lipemic serum. Ordered again.   Suggested to give vit b12.  All the records are reviewed and case discussed with Care Management/Social Workerr. Management plans discussed with the patient, his father and they are in agreement.  CODE STATUS: full  TOTAL TIME TAKING CARE OF THIS PATIENT: 36 minutes.  Discussed with his wife in room.  POSSIBLE D/C IN 1-2 DAYS, DEPENDING ON CLINICAL CONDITION.   Sergio [name] M.D on 08/03/2016   Between 7am to 6pm - Pager - [phone]  After 6pm go to [email or web address] - password [email or web address] [facility]  Office  [phone]  CC: Primary care physician; Sergio Mc, MD  Note: This dictation was prepared with Dragon dictation along with smaller phrase technology. Any transcriptional errors that result from this process are unintentional.

## 2016-08-03 NOTE — Progress Notes (Signed)
Spoke with pt this AM.  Patient resumed insulin pump per Dr. Joycie Peek request as his triglyceride level was <1000 mg/dl this AM.  Patient A&O and able to use insulin pump independently.  Using temporary reduced basal rate (50% normal rates) for 24 hour period per Dr. Joycie Peek request.  Reviewed insulin pump policy with patient.    Reviewed insulin pump charting requirements with RN.  Insulin pump orders entered into [facility] at Dr. Lianne Moris request.    --Will follow patient during hospitalization--  Wyn Quaker RN, MSN, CDE Diabetes Coordinator Inpatient Glycemic Control Team Team Pager: [phone] (8a-5p)

## 2016-08-04 LAB — GLUCOSE, CAPILLARY
Glucose-Capillary: 111 mg/dL — ABNORMAL HIGH (ref 65–99)
Glucose-Capillary: 101 mg/dL — ABNORMAL HIGH (ref 65–99)
Glucose-Capillary: 102 mg/dL — ABNORMAL HIGH (ref 65–99)

## 2016-08-04 LAB — BASIC METABOLIC PANEL
Anion gap: 8 (ref 5–15)
BUN: 10 mg/dL (ref 6–20)
CALCIUM: 9.2 mg/dL (ref 8.9–10.3)
CO2: 26 mmol/L (ref 22–32)
Chloride: 102 mmol/L (ref 101–111)
Creatinine, Ser: 0.97 mg/dL (ref 0.61–1.24)
GFR calc Af Amer: >60 mL/min (ref >60)
GLUCOSE: 106 mg/dL — AB (ref 65–99)
POTASSIUM: 4 mmol/L (ref 3.5–5.1)
SODIUM: 136 mmol/L (ref 135–145)

## 2016-08-04 LAB — TRIGLYCERIDES: TRIGLYCERIDES: 874 mg/dL — AB (ref <150)

## 2016-08-04 LAB — MAGNESIUM: MAGNESIUM: 1.8 mg/dL (ref 1.7–2.4)

## 2016-08-04 MED ORDER — OXYCODONE HCL 10 MG PO TABS: 10.0000 mg | ORAL_TABLET | Freq: Four times a day (QID) | ORAL | 0 refills | Status: DC | PRN

## 2016-08-04 MED ORDER — CYANOCOBALAMIN 1000 MCG PO TABS: 1000.0000 ug | ORAL_TABLET | Freq: Every day | ORAL | 0 refills | Status: DC

## 2016-08-04 MED ORDER — ROSUVASTATIN CALCIUM 10 MG PO TABS: 10.0000 mg | ORAL_TABLET | Freq: Every day | ORAL | 2 refills | Status: DC

## 2016-08-04 NOTE — Progress Notes (Signed)
Endocrinology follow up  REFERRING PHYSICIAN:  Myrla Halsted, MD CONSULTING PHYSICIAN:  A. Lavone Orn, MD. PRIMARY CARE PROVIDER: [name], MD.  History of present illness Sergio [name] is a 40 y.o. male seen in follow up for type 2 diabetes and familial hypertriglyceridemia with recurrent acute pancreatitis due to hypertriglyceridemia. He was admitted on 07/25/16 with acute pancreatitis.  Tg level now 874. He was transferred today to a floor bed after restarting his insulin pump and stopping IV insulin and IVF. Feels well. No abd pain. No N/V. Sugars have been in the 68-90s  Good appetite.   Medical history Past Medical History:  Diagnosis Date  . Anxiety   . Chicken pox   . Depression   . Diabetes mellitus   . Familial hypertriglyceridemia    severe  . Gastric ulcer 2009  . HTN (hypertension)   . Morbid obesity ([place])    s/p bariatric sleeve surgery 01/2015  . Recurrent acute pancreatitis    secondary to hypertriglyceridemia   . Sleep apnea 1999   uses CPAP     Surgical history Past Surgical History:  Procedure Laterality Date  . CHOLECYSTECTOMY    . LAPAROSCOPIC GASTRIC SLEEVE RESECTION    . [place] SURGERY  2008  . TONSILLECTOMY  2003  . VASECTOMY       Medications . docusate sodium  100 mg Oral BID  . enoxaparin (LOVENOX) injection  40 mg Subcutaneous Q24H  . finasteride  5 mg Oral Daily  . gemfibrozil  600 mg Oral BID AC  . insulin pump   Subcutaneous TID AC, HS, 0200  . omega-3 acid ethyl esters  4 g Oral QHS  . pneumococcal 23 valent vaccine  0.5 mL Intramuscular Tomorrow-1000  . pregabalin  300 mg Oral QHS  . rosuvastatin  10 mg Oral q1800  . sertraline  100 mg Oral Daily  . sodium chloride flush  3 mL Intravenous Q12H  . tamsulosin  0.8 mg Oral Daily  . traZODone  100 mg Oral QHS  . vitamin B-12  1,000 mcg Oral Daily     Social history Social History  Substance Use Topics  . Smoking status: Never Smoker  . Smokeless tobacco: Never  Used  . Alcohol use No     Comment: once month     Family history Family History  Problem Relation Age of Onset  . Adopted: Yes  . Hypertension Mother   . Hyperlipidemia Mother   . Hypertension Father   . Hyperlipidemia Father     Review of systems CV: no chest pain or palpitations PULM: no cough or shortness of breath ABD: Good appetite.    Physical Exam BP 109/67 (BP Location: Right Arm)   Pulse 61   Temp 98.1 F (36.7 C) (Oral)   Resp 18   Ht 5\' 11"  (1.803 m)   Wt 110.7 kg (244 lb 0.8 oz)   SpO2 97%   BMI 34.04 kg/m   GEN: white male in NAD. HEENT: No proptosis, lid lag or stare. Oropharynx is clear.  NECK: supple, trachea midline.   RESPIRATORY: clear bilaterally, no wheeze, good inspiratory effort. CV: No carotid bruits, RRR. MUSCULOSKELETAL: no peripheral edema, no clubbing, no tremor ABD: soft, no epigastric TTP, NABS SKIN: +few xanthoma in the arms, unchanged LYMPH: no submandibular or supraclavicular LAD NEURO: PERRL, EOMI PSYC: alert and oriented, good insight  Labs Results for orders placed or performed during the hospital encounter of 07/24/16 (from the past 24 hour(s))  Glucose,  capillary     Status: None   Collection Time: 08/03/16 11:29 AM  Result Value Ref Range   Glucose-Capillary 93 65 - 99 mg/dL  Glucose, capillary     Status: None   Collection Time: 08/03/16  4:16 PM  Result Value Ref Range   Glucose-Capillary 67 65 - 99 mg/dL  Glucose, capillary     Status: Abnormal   Collection Time: 08/04/16  2:07 AM  Result Value Ref Range   Glucose-Capillary 111 (H) 65 - 99 mg/dL  Triglycerides     Status: Abnormal   Collection Time: 08/04/16  4:39 AM  Result Value Ref Range   Triglycerides 874 (H) <150 mg/dL  Basic metabolic panel     Status: Abnormal   Collection Time: 08/04/16  4:39 AM  Result Value Ref Range   Sodium 136 135 - 145 mmol/L   Potassium 4.0 3.5 - 5.1 mmol/L   Chloride 102 101 - 111 mmol/L   CO2 26 22 - 32 mmol/L   Glucose,  Bld 106 (H) 65 - 99 mg/dL   BUN 10 6 - 20 mg/dL   Creatinine, Ser 0.97 0.61 - 1.24 mg/dL   Calcium 9.2 8.9 - 10.3 mg/dL   GFR calc non Af Amer >60 >60 mL/min   GFR calc Af Amer >60 >60 mL/min   Anion gap 8 5 - 15  Magnesium     Status: None   Collection Time: 08/04/16  4:39 AM  Result Value Ref Range   Magnesium 1.8 1.7 - 2.4 mg/dL  Glucose, capillary     Status: Abnormal   Collection Time: 08/04/16  7:53 AM  Result Value Ref Range   Glucose-Capillary 101 (H) 65 - 99 mg/dL   Comment 1 Notify RN      Assessment 1.Acute pancreatitis due to hypertriglyceridemia. 2.Familial hypertriglyceridemia 3.Type 2 diabetes   4.   Diplopia  Plan 1.   Continue use of insulin pump. Continue basal rate at 50% (as a "temp basal") for next 24 hrs. 2.   Diet provided is not sugar free. Will change tomorrow to bland foods, sugar free only. Will asking nursing to assist with verifying diet is as ordered.  3.   Continue statin, fibrate, and fish oil for now.  4.   Nursing to check sugars qACHS. Patient to check sugars ad lib. Patient may use fingersticks from his glucometer or nursing fingersticks to determine appropriate insulin dosing.  5.   Anticipate if he tolerates his diet and sugars are reasonable, that he can discharge tomorrow.   Will follow along.

## 2016-08-04 NOTE — Discharge Summary (Signed)
[facility] at [place] NAME: Sergio [name]    MR#:  [id number]  DATE OF BIRTH:  1976/02/13  DATE OF ADMISSION:  07/24/2016   ADMITTING PHYSICIAN: Theodoro Grist, MD  DATE OF DISCHARGE: 08/04/2016 PRIMARY CARE PHYSICIAN: Crecencio Mc, MD   ADMISSION DIAGNOSIS:  Hypertriglyceridemia [E78.1] Other acute pancreatitis, unspecified complication status 99991111 DISCHARGE DIAGNOSIS:  Active Problems:   Hypertriglyceridemia   Chronic pancreatitis (HCC)   Hyponatremia   Hypokalemia   Sixth nerve palsy of left eye  SECONDARY DIAGNOSIS:   Past Medical History:  Diagnosis Date  . Anxiety   . Chicken pox   . Depression   . Diabetes mellitus   . Familial hypertriglyceridemia    severe  . Gastric ulcer 2009  . HTN (hypertension)   . Morbid obesity ([place])    s/p bariatric sleeve surgery 01/2015  . Recurrent acute pancreatitis    secondary to hypertriglyceridemia   . Sleep apnea 1999   uses CPAP   HOSPITAL COURSE:   #1, hypertriglyceridemia,   He has been treated with insulin IV drip, Lopid and omega-3 fatty acids,    Appreciated Dr.Solum's help to manage hypertriglyceridemia.  discontinued D10 drip + high dose insulin drip and started insulin pump per Dr. Joycie Peek recommendation.   TG is 874. Started Free sugar liquid diet and change to bland foods, sugar free only per Dr. Gabriel Carina. . #2 chronic pancreatitis exacerbation, supportive therapy with IV fluids, pain medications   Tried to try long acting oxycodone- but pt have more pain, try to tapered down dilaudid and continue short acting oxycodone. Improved.  #3. Hyponatremia,  improved with IV fluids with normal saline.  #4. Hypokalemia, improved with supplement intravenously.  #5. BPH, advance patient's finasteride and Flomax, per outpatient urologist recommendations    He had retention > 500 ml urine on bladder scan - called urology consult.    As he had similar complains in  past,  Continue foley and try to remove it near end of his stay.   This happens mostly with dilaudid as per pt, continue oral oxycodone and try to taper dilaudid. Discontinued Foley catheter.  #6. Diabetes mellitus, continue insulin IV drip for now, endocrinologist consultation is appreciated,     Need to start back on insulin pump once TG is under control.   Dr. Gabriel Carina explained him about the reduced setting. BS is controlled with insulin pump protocol.   #7 Double vision   CT head negative, Did MRI brain and MRA - negative- appreciated neurology consult.   Eye patch to help.   Special tests ordered , could not be done due to lipemic serum. Ordered and sent again.   Suggested to give vit b12.  DISCHARGE CONDITIONS:  Stable, discharge to home today. CONSULTS OBTAINED:  Treatment Team:  Judi Cong, MD Catarina Hartshorn, MD Alexis Goodell, MD Hollice Espy, MD DRUG ALLERGIES:   Allergies  Allergen Reactions  . Codeine Anaphylaxis  . Ivp Dye [Iodinated Diagnostic Agents] Other (See Comments)    Kidneys stop working   DISCHARGE MEDICATIONS:     Medication List    TAKE these medications   clonazePAM 0.5 MG tablet Commonly known as:  KLONOPIN Take 1 tablet (0.5 mg total) by mouth daily. In the afternoon for anxiety   cyanocobalamin 1000 MCG tablet Take 1 tablet (1,000 mcg total) by mouth daily. Start taking on:  08/05/2016   finasteride 5 MG tablet Commonly known as:  PROSCAR Take 1  tablet (5 mg total) by mouth daily.   gemfibrozil 600 MG tablet Commonly known as:  LOPID Take 1 tablet (600 mg total) by mouth 2 (two) times daily before a meal.   NOVOLOG 100 UNIT/ML injection Generic drug:  insulin aspart Inject 1 Dose into the skin See admin instructions. Use up to 120 units daily via insulin pump Humalog   omega-3 acid ethyl esters 1 g capsule Commonly known as:  LOVAZA Take 4 g by mouth at bedtime.   ondansetron 4 MG tablet Commonly known as:  ZOFRAN Take  1 tablet (4 mg total) by mouth every 8 (eight) hours as needed for nausea or vomiting.   Oxycodone HCl 10 MG Tabs Take 1 tablet (10 mg total) by mouth every 6 (six) hours as needed for moderate pain or breakthrough pain.   pregabalin 300 MG capsule Commonly known as:  LYRICA Take 300 mg by mouth at bedtime.   rosuvastatin 10 MG tablet Commonly known as:  CRESTOR Take 1 tablet (10 mg total) by mouth daily at 6 PM.   sertraline 100 MG tablet Commonly known as:  ZOLOFT Take 1.5 tablets (150 mg total) by mouth daily. What changed:  how much to take   tamsulosin 0.4 MG Caps capsule Commonly known as:  FLOMAX Take 1 capsule (0.4 mg total) by mouth daily.   traZODone 100 MG tablet Commonly known as:  DESYREL Take 100 mg by mouth at bedtime.        DISCHARGE INSTRUCTIONS:  See AVS  If you experience worsening of your admission symptoms, develop shortness of breath, life threatening emergency, suicidal or homicidal thoughts you must seek medical attention immediately by calling 911 or calling your MD immediately  if symptoms less severe.  You Must read complete instructions/literature along with all the possible adverse reactions/side effects for all the Medicines you take and that have been prescribed to you. Take any new Medicines after you have completely understood and accpet all the possible adverse reactions/side effects.   Please note  You were cared for by a hospitalist during your hospital stay. If you have any questions about your discharge medications or the care you received while you were in the hospital after you are discharged, you can call the unit and asked to speak with the hospitalist on call if the hospitalist that took care of you is not available. Once you are discharged, your primary care physician will handle any further medical issues. Please note that NO REFILLS for any discharge medications will be authorized once you are discharged, as it is imperative that  you return to your primary care physician (or establish a relationship with a primary care physician if you do not have one) for your aftercare needs so that they can reassess your need for medications and monitor your lab values.    On the day of Discharge:  VITAL SIGNS:  Blood pressure 115/76, pulse 69, temperature 98.1 F (36.7 C), temperature source Oral, resp. rate 18, height 5\' 11"  (1.803 m), weight 244 lb 0.8 oz (110.7 kg), SpO2 95 %. PHYSICAL EXAMINATION:  GENERAL:  40 y.o.-year-old patient lying in the bed with no acute distress. Obese. EYES: Pupils equal, round, reactive to light and accommodation. No scleral icterus. Extraocular muscles intact.  HEENT: Head atraumatic, normocephalic. Oropharynx and nasopharynx clear.  NECK:  Supple, no jugular venous distention. No thyroid enlargement, no tenderness.  LUNGS: Normal breath sounds bilaterally, no wheezing, rales,rhonchi or crepitation. No use of accessory muscles of respiration.  CARDIOVASCULAR: S1, S2 normal. No murmurs, rubs, or gallops.  ABDOMEN: Soft, non-tender, non-distended. Bowel sounds present. No organomegaly or mass.  EXTREMITIES: No pedal edema, cyanosis, or clubbing.  NEUROLOGIC: Cranial nerves II through XII are intact. Muscle strength 5/5 in all extremities. Sensation intact. Gait not checked.  PSYCHIATRIC: The patient is alert and oriented x 3.  SKIN: No obvious rash, lesion, or ulcer.  DATA REVIEW:   CBC No results for input(s): WBC, HGB, HCT, PLT in the last 168 hours.  Chemistries   Recent Labs Lab 08/04/16 0439  NA 136  K 4.0  CL 102  CO2 26  GLUCOSE 106*  BUN 10  CREATININE 0.97  CALCIUM 9.2  MG 1.8     Microbiology Results  Results for orders placed or performed during the hospital encounter of 07/24/16  MRSA PCR Screening     Status: None   Collection Time: 07/24/16 10:23 PM  Result Value Ref Range Status   MRSA by PCR NEGATIVE NEGATIVE Final    Comment:        The GeneXpert MRSA Assay  (FDA approved for NASAL specimens only), is one component of a comprehensive MRSA colonization surveillance program. It is not intended to diagnose MRSA infection nor to guide or monitor treatment for MRSA infections.     RADIOLOGY:  No results found.   Management plans discussed with the patient, family and they are in agreement.  CODE STATUS:     Code Status Orders        Start     Ordered   07/24/16 2252  Full code  Continuous     07/24/16 2251    Code Status History    Date Active Date Inactive Code Status Order ID Comments User Context   03/11/2016  4:58 AM 03/19/2016  4:44 PM Full Code [id number]  Harrie Foreman, MD Inpatient   01/17/2016  2:50 AM 01/27/2016  5:47 PM Full Code [id number]  Harrie Foreman, MD Inpatient   08/01/2015 11:35 PM 08/15/2015  4:43 PM Full Code [id number]  Lytle Butte, MD ED   07/21/2014 11:46 AM 07/27/2014  6:16 PM Full Code [id number]  Oswald Hillock, MD Inpatient      TOTAL TIME TAKING CARE OF THIS PATIENT: 36 minutes.    Demetrios Loll M.D on 08/04/2016 at 2:37 PM  Between 7am to 6pm - Pager - [phone]  After 6pm go to [email or web address] - Proofreader  [facility]  Office  [phone]  CC: Primary care physician; Crecencio Mc, MD   Note: This dictation was prepared with Dragon dictation along with smaller phrase technology. Any transcriptional errors that result from this process are unintentional.

## 2016-08-04 NOTE — Progress Notes (Signed)
Sergio [name] for Electrolyte Management Indication: Insulin Drip for hypertriglyceridemia-* now on Insulin Pump   40 y/o M with a h/o familial hypertriglyceridemia, related pancreatitis, DM, HTN,  and obesity on insulin pump admitted with elevated TG. Patient was on insulin drip at 12 units/hr and D10%/0.45NS at 175 mL/hr.   Plan:  Patient now on Insulin Pump, off Insulin drip.  Electrolytes WNL, no supplementation today.    Allergies  Allergen Reactions  . Codeine Anaphylaxis  . Ivp Dye [Iodinated Diagnostic Agents] Other (See Comments)    Kidneys stop working    Patient Measurements: Height: 5\' 11"  (180.3 cm) Weight: 244 lb 0.8 oz (110.7 kg) IBW/kg (Calculated) : 75.3  Vital Signs: Temp: 98.1 F (36.7 C) (10/25 0519) Temp Source: Oral (10/25 0519) BP: 109/67 (10/25 0519) Pulse Rate: 61 (10/25 0519) Intake/Output from previous day: 10/24 0701 - 10/25 0700 In: 7589 [P.O.:480; I.V.:7109] Out: 2150 [Urine:2150] Intake/Output from this shift: No intake/output data recorded.  Labs:  Recent Labs  08/02/16 0213 08/03/16 0354 08/04/16 0439  CREATININE 0.84 0.97 0.97  MG  --  1.7 1.8   Estimated Creatinine Clearance: 130.7 mL/min (by C-G formula based on SCr of 0.97 mg/dL).   Lab Results  Component Value Date   K 4.0 08/04/2016    Pharmacy will continue to monitor and adjust per consult.    Sergio [name] A 08/04/2016,7:19 AM

## 2016-08-04 NOTE — Progress Notes (Signed)
MD ordered patient to be discharged home.  Discharge instructions were reviewed with the patient and his wife and they voiced understanding.  Follow-up appointment was made.  Prescriptions given to the patient.  IVs were removed with catheter intact.  All patients questions were answered.  Patient left via wheelchair escorted by auxillary.

## 2016-08-04 NOTE — Discharge Instructions (Signed)
°  Bland foods, sugar free only.

## 2016-08-04 NOTE — Progress Notes (Signed)
Called Dr. Gabriel Carina to make sure that it was ok for the patient to be discharged.  Dr. Gabriel Carina acknowledged and said it was ok for him to leave.

## 2016-08-05 LAB — STRIATED MUSCLE ANTIBODY: Anti-striation Abs: NEGATIVE

## 2016-08-05 LAB — ANTI-SMOOTH MUSCLE ANTIBODY, IGG: F-Actin IgG: 12 Units (ref 0–19)

## 2016-08-09 ENCOUNTER — Encounter: Payer: Self-pay | Admitting: Internal Medicine

## 2016-08-09 ENCOUNTER — Ambulatory Visit (INDEPENDENT_AMBULATORY_CARE_PROVIDER_SITE_OTHER): Payer: BLUE CROSS/BLUE SHIELD | Admitting: Internal Medicine

## 2016-08-09 DIAGNOSIS — F331 Major depressive disorder, recurrent, moderate: Secondary | ICD-10-CM

## 2016-08-09 DIAGNOSIS — E781 Pure hyperglyceridemia: Secondary | ICD-10-CM

## 2016-08-09 DIAGNOSIS — K859 Acute pancreatitis without necrosis or infection, unspecified: Secondary | ICD-10-CM

## 2016-08-09 DIAGNOSIS — F411 Generalized anxiety disorder: Secondary | ICD-10-CM

## 2016-08-09 DIAGNOSIS — Z09 Encounter for follow-up examination after completed treatment for conditions other than malignant neoplasm: Secondary | ICD-10-CM

## 2016-08-09 DIAGNOSIS — E669 Obesity, unspecified: Secondary | ICD-10-CM

## 2016-08-09 DIAGNOSIS — I10 Essential (primary) hypertension: Secondary | ICD-10-CM

## 2016-08-09 DIAGNOSIS — Z9884 Bariatric surgery status: Secondary | ICD-10-CM

## 2016-08-09 DIAGNOSIS — K861 Other chronic pancreatitis: Secondary | ICD-10-CM

## 2016-08-09 DIAGNOSIS — IMO0002 Reserved for concepts with insufficient information to code with codable children: Secondary | ICD-10-CM

## 2016-08-09 DIAGNOSIS — H4922 Sixth [abducent] nerve palsy, left eye: Secondary | ICD-10-CM

## 2016-08-09 DIAGNOSIS — E871 Hypo-osmolality and hyponatremia: Secondary | ICD-10-CM

## 2016-08-09 MED ORDER — CLONAZEPAM 0.5 MG PO TABS: 0.2500 mg | ORAL_TABLET | Freq: Two times a day (BID) | ORAL | 0 refills | Status: DC

## 2016-08-09 NOTE — Progress Notes (Signed)
Subjective:  Patient ID: Sergio [name], male    DOB: 09-Oct-1976  Age: 40 y.o. MRN: [id number]  CC: Diagnoses of Depression, major, recurrent, moderate ([place]), Generalized anxiety disorder, Hyponatremia, Recurrent pancreatitis ([place]), Obesity (BMI 30-39.9), S/P bariatric surgery, Essential hypertension, Sixth nerve palsy of left eye, Hypertriglyceridemia, and Hospital discharge follow-up were pertinent to this visit.  HPI Sergio [name] presents for [place].  Patient was admitted to [facility] with the diagnosis of pancreatitis on Oct 14th after presenting with epigastric pain radiating to the back fthat started 2 days prior to admission. No nausea or vomiting. Despite a well known history of recurrent pancreatitis secondary to hypertriglyceridemia (last admission June 2017) , he was initially discharged from ED with rx for dilaudud because his lipase was normal and an unknown  triglyceride level  . However,  his wife insisted that his labs be repeated and a serum triglyceride level was finally reported as > 5000, so he was admitted. He was treated initially with IV fluids and an insulin drip and Endicrinology consult was obtained for management of lipids and diabetes via chronic use of insulin pump. During this hospitalization he was also seen by Neurology for  development of d double vision. Head CT and MRI/MRA were done prior to diagnosis 6th nerve palsy, and he was given an eye patch.   .   He was discharged on  October 25.  He denies abdominal pain and  is adamant about avoiding use of narcotics due to history of narcotics abuse. His double vision persists. He has opthalmology follow up and Endocrine follow up  Wife notes that he typically develops xanthomas well in advance of his development of abdominal (two weeks prior) .   GAD:  He has been avoiding use of Clonazepam because it is  too sedating at 0.5mg  dose   Ut he has not tried reducing the dse to 0.25 mg.  He continues to endorse  anxiety although his symptoms  have improved with job change. 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  He has purchased health insurance on the exchange which is very affordable at around $200/month for husband and wife.  Wife is unemployed   sees [employer] in November.  Dose has been adjusted since discharge.  6th nerve palsy left eye   Being treated by [facility] with opaque glasses       Outpatient Medications Prior to Visit  Medication Sig Dispense Refill  . finasteride (PROSCAR) 5 MG tablet Take 1 tablet (5 mg total) by mouth daily. 30 tablet 0  . gemfibrozil (LOPID) 600 MG tablet Take 1 tablet (600 mg total) by mouth 2 (two) times daily before a meal. 60 tablet 0  . insulin aspart (NOVOLOG) 100 UNIT/ML injection Inject 1 Dose into the skin See admin instructions. Use up to 120 units daily via insulin pump Humalog    . omega-3 acid ethyl esters (LOVAZA) 1 g capsule Take 4 g by mouth at bedtime.    . ondansetron (ZOFRAN) 4 MG tablet Take 1 tablet (4 mg total) by mouth every 8 (eight) hours as needed for nausea or vomiting. 20 tablet 1  . oxyCODONE 10 MG TABS Take 1 tablet (10 mg total) by mouth every 6 (six) hours as needed for moderate pain or breakthrough pain. 12 tablet 0  . pregabalin (LYRICA) 300 MG capsule Take 300 mg by mouth at bedtime.     [name]  rosuvastatin (CRESTOR) 10 MG tablet Take 1 tablet (10 mg total) by mouth daily at 6 PM. 30 tablet 2  . tamsulosin (FLOMAX) 0.4 MG CAPS capsule Take 1 capsule (0.4 mg total) by mouth daily. 30 capsule 0  . traZODone (DESYREL) 100 MG tablet Take 100 mg by mouth at bedtime.    . vitamin B-12 1000 MCG tablet Take 1 tablet (1,000 mcg total) by mouth daily. 30 tablet 0  . clonazePAM (KLONOPIN) 0.5 MG tablet Take 1 tablet (0.5 mg total) by mouth daily. In the afternoon for anxiety 30 tablet 1  . sertraline (ZOLOFT) 100 MG tablet Take 1.5 tablets (150 mg total) by mouth daily. (Patient taking  differently: Take 100 mg by mouth daily. ) 60 tablet 0   No facility-administered medications prior to visit.     Review of Systems;  Patient denies headache, fevers, malaise, unintentional weight loss, skin rash, eye pain, sinus congestion and sinus pain, sore throat, dysphagia,  hemoptysis , cough, dyspnea, wheezing, chest pain, palpitations, orthopnea, edema, abdominal pain, nausea, melena, diarrhea, constipation, flank pain, dysuria, hematuria, urinary  Frequency, nocturia, numbness, tingling, seizures,  Focal weakness, Loss of consciousness,  Tremor, insomnia, depression, anxiety, and suicidal ideation.      Objective:  BP 120/80   Pulse 78   Wt 236 lb (107 kg)   SpO2 96%   BMI 32.92 kg/m   BP Readings from Last 3 Encounters:  08/09/16 120/80  08/04/16 115/76  04/28/16 138/88    Wt Readings from Last 3 Encounters:  08/09/16 236 lb (107 kg)  08/01/16 244 lb 0.8 oz (110.7 kg)  04/28/16 238 lb 8 oz (108.2 kg)    General appearance: alert, cooperative and appears stated age Ears: normal TM's and external ear canals both ears Throat: lips, mucosa, and tongue normal; teeth and gums normal Neck: no adenopathy, no carotid bruit, supple, symmetrical, trachea midline and thyroid not enlarged, symmetric, no tenderness/mass/nodules Back: symmetric, no curvature. ROM normal. No CVA tenderness. Lungs: clear to auscultation bilaterally Heart: regular rate and rhythm, S1, S2 normal, no murmur, click, rub or gallop Abdomen: soft, non-tender; bowel sounds normal; no masses,  no organomegaly Pulses: 2+ and symmetric Skin: rare xanthomas on arms, legs, No rashes or lesions Lymph nodes: Cervical, supraclavicular, and axillary nodes normal. Neuro: Left medial rectus palsy . Otherwise normal  Lab Results  Component Value Date   HGBA1C 7.9 (H) 07/24/2016   HGBA1C 8.6 (H) 03/11/2016   HGBA1C 9.7 (H) 01/16/2016    Lab Results  Component Value Date   CREATININE 0.97 08/04/2016    CREATININE 0.97 08/03/2016   CREATININE 0.84 08/02/2016    Lab Results  Component Value Date   WBC 5.7 07/25/2016   HGB 13.0 07/25/2016   HCT 37.9 (L) 07/25/2016   PLT 217 07/25/2016   GLUCOSE 106 (H) 08/04/2016   CHOL 899 (H) 03/10/2016   TRIG 874 (H) 08/04/2016   HDL 16 (L) 03/10/2016   LDLDIRECT 111.4 06/30/2012   LDLCALC UNABLE TO CALCULATE IF TRIGLYCERIDE OVER 400 mg/dL 03/10/2016   ALT 19 07/25/2016   AST 18 07/25/2016   NA 136 08/04/2016   K 4.0 08/04/2016   CL 102 08/04/2016   CREATININE 0.97 08/04/2016   BUN 10 08/04/2016   CO2 26 08/04/2016   TSH 0.507 07/29/2016   INR 1.0 11/04/2014   HGBA1C 7.9 (H) 07/24/2016    No results found.  Assessment & Plan:   Problem List Items Addressed This Visit    Hypertriglyceridemia  Now managed with Crestor, Lopid and fish oil.  Wife notes the development of  recurrent xanthomas leading up to admissions and requests proactive treatment to avoid admission in the future.  Not possible due to nature of treatment,  Requiring insulin infusion       HTN (hypertension)    Well controlled on current regimen. Renal function stable, no changes today.  Lab Results  Component Value Date   CREATININE 0.97 08/04/2016   Lab Results  Component Value Date   NA 136 08/04/2016   K 4.0 08/04/2016   CL 102 08/04/2016   CO2 26 08/04/2016         Generalized anxiety disorder    Aggravated by work  Stressors,  Now somewhat relieved with job change.  Reduce clonazepam dose to 0.25 mg bid prn and continue zoloft 100 mg daily.  Avoid alprazolam given history      Obesity (BMI 30-39.9)    I have addressed  BMI and recommended a low glycemic index diet utilizing smaller more frequent meals to increase metabolism.  I have also recommended that patient start exercising with a goal of 30 minutes of aerobic exercise a minimum of 5 days per week.      Recurrent pancreatitis ([place])    Secondary to hypertriglcyeridema.  Despite report by  patient and  wife of strict compliance with diet and medications.  Trigs were < 100 at discharge,  Continue Lopid, Crestor, low GI diet and  AGGRESSIVE GLYCEMIC CONTROL.        S/P bariatric surgery    S/p gastric sleeve Feb 2016 .  Pre procedure weight was 260 lbs and nadir was 212 lbs.        Depression, major, recurrent, moderate ([facility])    Symptoms are controlled with zoloft now at 100 mg daily       Relevant Medications   sertraline (ZOLOFT) 100 MG tablet   Hyponatremia    Chronic recurrent,  Resolved at discharge. Was Treated with IV saline  Lab Results  Component Value Date   NA 136 08/04/2016   K 4.0 08/04/2016   CL 102 08/04/2016   CO2 26 08/04/2016         Sixth nerve palsy of left eye    Continue use of opaque glasses.  Follow up with Ophthalmology.       Relevant Medications   clonazePAM (KLONOPIN) 0.5 MG tablet   sertraline (ZOLOFT) 100 MG tablet   Hospital discharge follow-up    Patient is stable post discharge and has no new issues or questions about discharge plans at the visit today for hospital follow up.  I have reviewed the records from the hospital admission in detail with patient today.       Other Visit Diagnoses   None.     I have changed Mr. Campau clonazePAM and sertraline. I am also having him maintain his gemfibrozil, pregabalin, traZODone, insulin aspart, omega-3 acid ethyl esters, finasteride, tamsulosin, ondansetron, Oxycodone HCl, cyanocobalamin, and rosuvastatin.  Meds ordered this encounter  Medications  . clonazePAM (KLONOPIN) 0.5 MG tablet    Sig: Take 0.5 tablets (0.25 mg total) by mouth 2 (two) times daily. In the afternoon for anxiety    Dispense:  60 tablet    Refill:  0  . sertraline (ZOLOFT) 100 MG tablet    Sig: Take 1 tablet (100 mg total) by mouth daily.    Dispense:  90 tablet    Refill:  1    KEEP ON  FILE FOR FUTURE REFILLS    Medications Discontinued During This Encounter  Medication Reason  . clonazePAM  (KLONOPIN) 0.5 MG tablet Reorder  . sertraline (ZOLOFT) 100 MG tablet Reorder    Follow-up: No Follow-up on file.   Crecencio Mc, MD

## 2016-08-09 NOTE — Patient Instructions (Signed)
I have reduced your clonazepam to 0.25 mg  For your anxiety . You can take it twice daily if needed

## 2016-08-10 DIAGNOSIS — Z09 Encounter for follow-up examination after completed treatment for conditions other than malignant neoplasm: Secondary | ICD-10-CM | POA: Insufficient documentation

## 2016-08-10 LAB — ACETYLCHOLINE RECEPTOR AB, ALL: ACETYLCHOL BLOCK AB: 16 % (ref 0–25)

## 2016-08-10 MED ORDER — SERTRALINE HCL 100 MG PO TABS: 100.0000 mg | ORAL_TABLET | Freq: Every day | ORAL | 1 refills | Status: DC

## 2016-08-10 NOTE — Assessment & Plan Note (Signed)
Chronic recurrent,  Resolved at discharge. Was Treated with IV saline  Lab Results  Component Value Date   NA 136 08/04/2016   K 4.0 08/04/2016   CL 102 08/04/2016   CO2 26 08/04/2016

## 2016-08-10 NOTE — Assessment & Plan Note (Signed)
I have addressed  BMI and recommended a low glycemic index diet utilizing smaller more frequent meals to increase metabolism.  I have also recommended that patient start exercising with a goal of 30 minutes of aerobic exercise a minimum of 5 days per week.  

## 2016-08-10 NOTE — Assessment & Plan Note (Signed)
S/p gastric sleeve Feb 2016 .  Pre procedure weight was 260 lbs and nadir was 212 lbs.

## 2016-08-10 NOTE — Assessment & Plan Note (Signed)
Well controlled on current regimen. Renal function stable, no changes today.  Lab Results  Component Value Date   CREATININE 0.97 08/04/2016   Lab Results  Component Value Date   NA 136 08/04/2016   K 4.0 08/04/2016   CL 102 08/04/2016   CO2 26 08/04/2016

## 2016-08-10 NOTE — Assessment & Plan Note (Signed)
Patient is stable post discharge and has no new issues or questions about discharge plans at the visit today for hospital follow up.  I have reviewed the records from the hospital admission in detail with patient today. 

## 2016-08-10 NOTE — Assessment & Plan Note (Signed)
Now managed with Crestor, Lopid and fish oil.  Wife notes the development of  recurrent xanthomas leading up to admissions and requests proactive treatment to avoid admission in the future.  Not possible due to nature of treatment,  Requiring insulin infusion

## 2016-08-10 NOTE — Assessment & Plan Note (Addendum)
Secondary to hypertriglcyeridema.  Despite report by patient and  wife of strict compliance with diet and medications.  Trigs were < 100 at discharge,  Continue Lopid, Crestor, low GI diet and  AGGRESSIVE GLYCEMIC CONTROL.

## 2016-08-10 NOTE — Assessment & Plan Note (Signed)
Continue use of opaque glasses.  Follow up with Ophthalmology.

## 2016-08-10 NOTE — Assessment & Plan Note (Signed)
Aggravated by work  Stressors,  Now somewhat relieved with job change.  Reduce clonazepam dose to 0.25 mg bid prn and continue zoloft 100 mg daily.  Avoid alprazolam given history

## 2016-08-10 NOTE — Assessment & Plan Note (Signed)
Symptoms are controlled with zoloft now at 100 mg daily

## 2016-10-10 ENCOUNTER — Other Ambulatory Visit: Payer: Self-pay | Admitting: Internal Medicine

## 2016-10-12 NOTE — Telephone Encounter (Signed)
Last OV 08/09/16 and last refill date .

## 2016-10-12 NOTE — Telephone Encounter (Signed)
Refill for 60 days .  OFFICE VISIT NEEDED prior to any more refills

## 2016-10-13 NOTE — Telephone Encounter (Signed)
Left patient detailed message on cell that needs to schedule follow up appointment. Script faxed.

## 2016-12-02 ENCOUNTER — Other Ambulatory Visit: Payer: Self-pay

## 2016-12-02 MED ORDER — SERTRALINE HCL 100 MG PO TABS: 100.0000 mg | ORAL_TABLET | Freq: Every day | ORAL | 1 refills | Status: DC

## 2016-12-15 ENCOUNTER — Telehealth: Payer: Self-pay | Admitting: *Deleted

## 2016-12-15 NOTE — Telephone Encounter (Signed)
[facility] has refill on file from 12/02/16. They will fill Rx now. Left detailed mess informing pt.

## 2016-12-15 NOTE — Telephone Encounter (Signed)
Requested medication refill for : Sertaline  With new directions Pharmacy: [facility]  Please Contact Pt when ready or sent to Pharmacy:

## 2017-01-28 ENCOUNTER — Inpatient Hospital Stay
Admission: EM | Admit: 2017-01-28 | Discharge: 2017-02-03 | DRG: 642 | Disposition: A | Discharge status: Home / Self Care | Payer: BLUE CROSS/BLUE SHIELD | Attending: Internal Medicine | Admitting: Internal Medicine

## 2017-01-28 ENCOUNTER — Encounter: Payer: Self-pay | Admitting: Emergency Medicine

## 2017-01-28 DIAGNOSIS — Z9641 Presence of insulin pump (external) (internal): Secondary | ICD-10-CM | POA: Diagnosis present

## 2017-01-28 DIAGNOSIS — Z8249 Family history of ischemic heart disease and other diseases of the circulatory system: Secondary | ICD-10-CM

## 2017-01-28 DIAGNOSIS — I10 Essential (primary) hypertension: Secondary | ICD-10-CM | POA: Diagnosis present

## 2017-01-28 DIAGNOSIS — Z794 Long term (current) use of insulin: Secondary | ICD-10-CM

## 2017-01-28 DIAGNOSIS — Z6835 Body mass index (BMI) 35.0-35.9, adult: Secondary | ICD-10-CM

## 2017-01-28 DIAGNOSIS — K861 Other chronic pancreatitis: Secondary | ICD-10-CM | POA: Diagnosis present

## 2017-01-28 DIAGNOSIS — E785 Hyperlipidemia, unspecified: Secondary | ICD-10-CM | POA: Diagnosis present

## 2017-01-28 DIAGNOSIS — Z9049 Acquired absence of other specified parts of digestive tract: Secondary | ICD-10-CM | POA: Diagnosis not present

## 2017-01-28 DIAGNOSIS — Z9884 Bariatric surgery status: Secondary | ICD-10-CM | POA: Diagnosis not present

## 2017-01-28 DIAGNOSIS — E119 Type 2 diabetes mellitus without complications: Secondary | ICD-10-CM | POA: Diagnosis present

## 2017-01-28 DIAGNOSIS — Z885 Allergy status to narcotic agent status: Secondary | ICD-10-CM

## 2017-01-28 DIAGNOSIS — E781 Pure hyperglyceridemia: Principal | ICD-10-CM | POA: Diagnosis present

## 2017-01-28 DIAGNOSIS — Z79899 Other long term (current) drug therapy: Secondary | ICD-10-CM

## 2017-01-28 DIAGNOSIS — E662 Morbid (severe) obesity with alveolar hypoventilation: Secondary | ICD-10-CM | POA: Diagnosis present

## 2017-01-28 DIAGNOSIS — K8689 Other specified diseases of pancreas: Secondary | ICD-10-CM | POA: Diagnosis present

## 2017-01-28 DIAGNOSIS — Z91041 Radiographic dye allergy status: Secondary | ICD-10-CM | POA: Diagnosis not present

## 2017-01-28 DIAGNOSIS — F329 Major depressive disorder, single episode, unspecified: Secondary | ICD-10-CM | POA: Diagnosis present

## 2017-01-28 DIAGNOSIS — R1013 Epigastric pain: Secondary | ICD-10-CM | POA: Diagnosis present

## 2017-01-28 LAB — BASIC METABOLIC PANEL
Anion gap: 7 (ref 5–15)
Anion gap: 4 — ABNORMAL LOW (ref 5–15)
BUN: 10 mg/dL (ref 6–20)
BUN: 7 mg/dL (ref 6–20)
CALCIUM: 8.2 mg/dL — AB (ref 8.9–10.3)
CALCIUM: 8 mg/dL — AB (ref 8.9–10.3)
CHLORIDE: 98 mmol/L — AB (ref 101–111)
CO2: 24 mmol/L (ref 22–32)
CO2: 26 mmol/L (ref 22–32)
CREATININE: 0.44 mg/dL — AB (ref 0.61–1.24)
CREATININE: 1.19 mg/dL (ref 0.61–1.24)
Chloride: 97 mmol/L — ABNORMAL LOW (ref 101–111)
GFR calc Af Amer: >60 mL/min (ref >60)
GFR calc Af Amer: >60 mL/min (ref >60)
GFR calc non Af Amer: >60 mL/min (ref >60)
GLUCOSE: 99 mg/dL (ref 65–99)
GLUCOSE: 87 mg/dL (ref 65–99)
Potassium: 3.7 mmol/L (ref 3.5–5.1)
Potassium: 5.4 mmol/L — ABNORMAL HIGH (ref 3.5–5.1)
Sodium: 128 mmol/L — ABNORMAL LOW (ref 135–145)
Sodium: 128 mmol/L — ABNORMAL LOW (ref 135–145)

## 2017-01-28 LAB — COMPREHENSIVE METABOLIC PANEL
ALK PHOS: 43 U/L (ref 38–126)
ALT: 25 U/L (ref 17–63)
ANION GAP: 19 — AB (ref 5–15)
AST: 34 U/L (ref 15–41)
Albumin: 4.7 g/dL (ref 3.5–5.0)
BUN: 10 mg/dL (ref 6–20)
CALCIUM: 9.1 mg/dL (ref 8.9–10.3)
CO2: 23 mmol/L (ref 22–32)
CREATININE: 0.59 mg/dL — AB (ref 0.61–1.24)
Chloride: 92 mmol/L — ABNORMAL LOW (ref 101–111)
Glucose, Bld: 160 mg/dL — ABNORMAL HIGH (ref 65–99)
Potassium: 3.3 mmol/L — ABNORMAL LOW (ref 3.5–5.1)
SODIUM: 134 mmol/L — AB (ref 135–145)
TOTAL PROTEIN: 6.6 g/dL (ref 6.5–8.1)
Total Bilirubin: 0.3 mg/dL (ref 0.3–1.2)

## 2017-01-28 LAB — GLUCOSE, CAPILLARY
Glucose-Capillary: 103 mg/dL — ABNORMAL HIGH (ref 65–99)
Glucose-Capillary: 112 mg/dL — ABNORMAL HIGH (ref 65–99)
Glucose-Capillary: 126 mg/dL — ABNORMAL HIGH (ref 65–99)
Glucose-Capillary: 131 mg/dL — ABNORMAL HIGH (ref 65–99)
Glucose-Capillary: 143 mg/dL — ABNORMAL HIGH (ref 65–99)
Glucose-Capillary: 161 mg/dL — ABNORMAL HIGH (ref 65–99)
Glucose-Capillary: 161 mg/dL — ABNORMAL HIGH (ref 65–99)
Glucose-Capillary: 166 mg/dL — ABNORMAL HIGH (ref 65–99)
Glucose-Capillary: 168 mg/dL — ABNORMAL HIGH (ref 65–99)
Glucose-Capillary: 92 mg/dL (ref 65–99)

## 2017-01-28 LAB — URINALYSIS, COMPLETE (UACMP) WITH MICROSCOPIC
Bacteria, UA: NONE SEEN
Bilirubin Urine: NEGATIVE
Glucose, UA: NEGATIVE mg/dL
Hgb urine dipstick: NEGATIVE
Ketones, ur: 5 mg/dL — AB
Leukocytes, UA: NEGATIVE
Nitrite: NEGATIVE
Protein, ur: NEGATIVE mg/dL
RBC / HPF: NONE SEEN RBC/hpf (ref 0–5)
Specific Gravity, Urine: 1.019 (ref 1.005–1.030)
Squamous Epithelial / LPF: NONE SEEN
pH: 5 (ref 5.0–8.0)

## 2017-01-28 LAB — MAGNESIUM: Magnesium: 2.7 mg/dL — ABNORMAL HIGH (ref 1.7–2.4)

## 2017-01-28 LAB — CBC
HCT: 40.8 % (ref 40.0–52.0)
HEMOGLOBIN: UNDETERMINED g/dL (ref 13.0–18.0)
MCH: UNDETERMINED pg (ref 26.0–34.0)
MCHC: UNDETERMINED g/dL (ref 32.0–36.0)
MCV: 78 fL — ABNORMAL LOW (ref 80.0–100.0)
PLATELETS: 177 10*3/uL (ref 150–440)
RBC: 5.31 MIL/uL (ref 4.40–5.90)
RDW: 16.4 % — ABNORMAL HIGH (ref 11.5–14.5)
WBC: 5 10*3/uL (ref 3.8–10.6)

## 2017-01-28 LAB — TRIGLYCERIDES: Triglycerides: >5000 mg/dL — ABNORMAL HIGH (ref <150)

## 2017-01-28 LAB — LIPASE, BLOOD: Lipase: 43 U/L (ref 11–51)

## 2017-01-28 LAB — TSH: TSH: 5.002 u[IU]/mL — ABNORMAL HIGH (ref 0.350–4.500)

## 2017-01-28 MED ORDER — SERTRALINE HCL 50 MG PO TABS
150.0000 mg | ORAL_TABLET | Freq: Every day | ORAL | Status: DC
  Administered 2017-01-28 – 2017-02-03 (×7): 150 mg via ORAL
  Filled 2017-01-28 (×4): qty 1
  Filled 2017-01-28: qty 3
  Filled 2017-01-28 (×2): qty 1

## 2017-01-28 MED ORDER — HYDROMORPHONE HCL 1 MG/ML IJ SOLN
1.0000 mg | INTRAMUSCULAR | Status: AC
Start: 2017-01-28 — End: 2017-01-28
  Administered 2017-01-28: 1 mg via INTRAVENOUS
  Filled 2017-01-28: qty 1

## 2017-01-28 MED ORDER — HYDROMORPHONE HCL 1 MG/ML IJ SOLN
INTRAMUSCULAR | Status: AC
  Filled 2017-01-28: qty 1

## 2017-01-28 MED ORDER — ALPRAZOLAM 0.5 MG PO TABS: 0.5000 mg | ORAL_TABLET | Freq: Three times a day (TID) | ORAL | Status: DC | PRN

## 2017-01-28 MED ORDER — TAMSULOSIN HCL 0.4 MG PO CAPS: 0.4000 mg | ORAL_CAPSULE | Freq: Every day | ORAL | Status: DC

## 2017-01-28 MED ORDER — DEXTROSE 50 % IV SOLN
25.0000 mL | INTRAVENOUS | Status: DC | PRN
  Administered 2017-01-29: 25 mL via INTRAVENOUS
  Filled 2017-01-28 (×2): qty 50

## 2017-01-28 MED ORDER — POTASSIUM CHLORIDE CRYS ER 20 MEQ PO TBCR
40.0000 meq | EXTENDED_RELEASE_TABLET | Freq: Once | ORAL | Status: AC
  Administered 2017-01-28: 40 meq via ORAL
  Filled 2017-01-28: qty 2

## 2017-01-28 MED ORDER — ONDANSETRON HCL 4 MG/2ML IJ SOLN
4.0000 mg | Freq: Once | INTRAMUSCULAR | Status: AC
  Administered 2017-01-28: 4 mg via INTRAVENOUS
  Filled 2017-01-28: qty 2

## 2017-01-28 MED ORDER — GEMFIBROZIL 600 MG PO TABS
600.0000 mg | ORAL_TABLET | Freq: Two times a day (BID) | ORAL | Status: DC
  Administered 2017-01-28 – 2017-02-03 (×13): 600 mg via ORAL
  Filled 2017-01-28 (×14): qty 1

## 2017-01-28 MED ORDER — SODIUM CHLORIDE 0.9 % IV BOLUS (SEPSIS)
1000.0000 mL | Freq: Once | INTRAVENOUS | Status: AC
  Administered 2017-01-28: 1000 mL via INTRAVENOUS

## 2017-01-28 MED ORDER — TRAZODONE HCL 100 MG PO TABS
200.0000 mg | ORAL_TABLET | Freq: Every day | ORAL | Status: DC
  Administered 2017-01-28 – 2017-02-02 (×6): 200 mg via ORAL
  Filled 2017-01-28 (×6): qty 2

## 2017-01-28 MED ORDER — TAMSULOSIN HCL 0.4 MG PO CAPS
0.4000 mg | ORAL_CAPSULE | Freq: Every day | ORAL | Status: DC
  Administered 2017-01-28 – 2017-02-02 (×6): 0.4 mg via ORAL
  Filled 2017-01-28 (×6): qty 1

## 2017-01-28 MED ORDER — KCL IN DEXTROSE-NACL 40-5-0.45 MEQ/L-%-% IV SOLN
INTRAVENOUS | Status: DC
  Filled 2017-01-28 (×2): qty 1000

## 2017-01-28 MED ORDER — PREGABALIN 75 MG PO CAPS
300.0000 mg | ORAL_CAPSULE | Freq: Every day | ORAL | Status: DC
  Administered 2017-01-28 – 2017-02-02 (×6): 300 mg via ORAL
  Filled 2017-01-28 (×6): qty 4

## 2017-01-28 MED ORDER — ENOXAPARIN SODIUM 40 MG/0.4ML ~~LOC~~ SOLN
40.0000 mg | SUBCUTANEOUS | Status: DC
  Administered 2017-01-28 – 2017-02-02 (×6): 40 mg via SUBCUTANEOUS
  Filled 2017-01-28 (×7): qty 0.4

## 2017-01-28 MED ORDER — ONDANSETRON HCL 4 MG/2ML IJ SOLN
INTRAMUSCULAR | Status: AC
  Administered 2017-01-28: 4 mg via INTRAVENOUS
  Filled 2017-01-28: qty 2

## 2017-01-28 MED ORDER — ONDANSETRON HCL 4 MG/2ML IJ SOLN
4.0000 mg | Freq: Once | INTRAMUSCULAR | Status: AC | PRN
  Administered 2017-01-28: 4 mg via INTRAVENOUS

## 2017-01-28 MED ORDER — ROSUVASTATIN CALCIUM 5 MG PO TABS
10.0000 mg | ORAL_TABLET | Freq: Every day | ORAL | Status: DC
  Administered 2017-01-28 – 2017-01-31 (×4): 10 mg via ORAL
  Administered 2017-02-01: 17:00:00 via ORAL
  Administered 2017-02-02: 17:00:00 10 mg via ORAL
  Filled 2017-01-28 (×3): qty 1
  Filled 2017-01-28: qty 2
  Filled 2017-01-28: qty 1

## 2017-01-28 MED ORDER — PANCRELIPASE (LIP-PROT-AMYL) 12000-38000 UNITS PO CPEP
72000.0000 [IU] | ORAL_CAPSULE | Freq: Three times a day (TID) | ORAL | Status: DC
  Administered 2017-01-28 – 2017-02-03 (×16): 72000 [IU] via ORAL
  Filled 2017-01-28 (×16): qty 6

## 2017-01-28 MED ORDER — ACETAMINOPHEN 650 MG RE SUPP: 650.0000 mg | Freq: Four times a day (QID) | RECTAL | Status: DC | PRN

## 2017-01-28 MED ORDER — DEXTROSE-NACL 5-0.45 % IV SOLN
INTRAVENOUS | Status: DC
  Administered 2017-01-28: 12:00:00 via INTRAVENOUS

## 2017-01-28 MED ORDER — KCL IN DEXTROSE-NACL 40-5-0.45 MEQ/L-%-% IV SOLN
INTRAVENOUS | Status: DC
  Administered 2017-01-28: 125 mL/h via INTRAVENOUS
  Filled 2017-01-28 (×2): qty 1000

## 2017-01-28 MED ORDER — SODIUM CHLORIDE 0.9% FLUSH
3.0000 mL | Freq: Two times a day (BID) | INTRAVENOUS | Status: DC
  Administered 2017-01-29 – 2017-02-03 (×9): 3 mL via INTRAVENOUS

## 2017-01-28 MED ORDER — SODIUM CHLORIDE 0.9 % IV SOLN
INTRAVENOUS | Status: DC
  Administered 2017-01-29 – 2017-01-31 (×2): 10 [IU]/h via INTRAVENOUS
  Filled 2017-01-28 (×5): qty 2.5

## 2017-01-28 MED ORDER — ONDANSETRON HCL 4 MG/2ML IJ SOLN
4.0000 mg | Freq: Four times a day (QID) | INTRAMUSCULAR | Status: DC | PRN
  Administered 2017-01-28 – 2017-02-02 (×9): 4 mg via INTRAVENOUS
  Filled 2017-01-28 (×9): qty 2

## 2017-01-28 MED ORDER — SODIUM CHLORIDE 4 MEQ/ML IV SOLN
INTRAVENOUS | Status: DC
  Administered 2017-01-28: 17:00:00 via INTRAVENOUS
  Filled 2017-01-28 (×4): qty 1000

## 2017-01-28 MED ORDER — ONDANSETRON HCL 4 MG PO TABS: 4.0000 mg | ORAL_TABLET | Freq: Four times a day (QID) | ORAL | Status: DC | PRN

## 2017-01-28 MED ORDER — ACETAMINOPHEN 325 MG PO TABS: 650.0000 mg | ORAL_TABLET | Freq: Four times a day (QID) | ORAL | Status: DC | PRN

## 2017-01-28 MED ORDER — ONDANSETRON 4 MG PO TBDP: 4.0000 mg | ORAL_TABLET | Freq: Once | ORAL | Status: DC | PRN

## 2017-01-28 MED ORDER — OXYCODONE HCL 5 MG PO TABS
5.0000 mg | ORAL_TABLET | ORAL | Status: DC | PRN
  Administered 2017-01-28: 5 mg via ORAL
  Filled 2017-01-28: qty 1

## 2017-01-28 MED ORDER — INSULIN REGULAR BOLUS VIA INFUSION
0.0000 [IU] | Freq: Three times a day (TID) | INTRAVENOUS | Status: DC
  Filled 2017-01-28: qty 10

## 2017-01-28 MED ORDER — HYDROMORPHONE HCL 1 MG/ML IJ SOLN
1.0000 mg | INTRAMUSCULAR | Status: DC | PRN
  Administered 2017-01-28 – 2017-02-03 (×27): 1 mg via INTRAVENOUS
  Filled 2017-01-28 (×26): qty 1

## 2017-01-28 MED ORDER — OMEGA-3-ACID ETHYL ESTERS 1 G PO CAPS
4.0000 g | ORAL_CAPSULE | Freq: Every day | ORAL | Status: DC
  Administered 2017-01-28 – 2017-02-02 (×6): 4 g via ORAL
  Filled 2017-01-28 (×6): qty 4

## 2017-01-28 MED ORDER — SODIUM CHLORIDE 0.9 % IV SOLN
INTRAVENOUS | Status: DC
  Administered 2017-01-28: 12:00:00 via INTRAVENOUS
  Filled 2017-01-28: qty 2.5

## 2017-01-28 MED ORDER — OXYCODONE HCL 5 MG PO TABS
10.0000 mg | ORAL_TABLET | ORAL | Status: DC | PRN
  Administered 2017-01-28 – 2017-02-03 (×14): 10 mg via ORAL
  Filled 2017-01-28 (×3): qty 2
  Filled 2017-01-28: qty 1
  Filled 2017-01-28 (×10): qty 2

## 2017-01-28 MED ORDER — FINASTERIDE 5 MG PO TABS
5.0000 mg | ORAL_TABLET | Freq: Every day | ORAL | Status: DC
  Administered 2017-01-28 – 2017-02-02 (×6): 5 mg via ORAL
  Filled 2017-01-28 (×6): qty 1

## 2017-01-28 MED ORDER — SODIUM CHLORIDE 0.9 % IV SOLN: INTRAVENOUS | Status: DC

## 2017-01-28 MED ORDER — DOCUSATE SODIUM 100 MG PO CAPS
100.0000 mg | ORAL_CAPSULE | Freq: Two times a day (BID) | ORAL | Status: DC
  Administered 2017-01-28 – 2017-02-01 (×2): 100 mg via ORAL
  Filled 2017-01-28 (×6): qty 1

## 2017-01-28 NOTE — Progress Notes (Signed)
[facility] NOTE  Pharmacy Consult for Electrolyte Monitoring  Indication: Insulin drip for HTGP   Allergies  Allergen Reactions  . Codeine Anaphylaxis  . Ivp Dye [Iodinated Diagnostic Agents] Other (See Comments)    Kidneys stop working    Patient Measurements: Height: 5\' 11"  (180.3 cm) Weight: 240 lb (108.9 kg) IBW/kg (Calculated) : 75.3  Vital Signs: Temp: 97.8 F (36.6 C) (04/20 0800) Temp Source: Oral (04/20 0151) BP: 110/70 (04/20 1100) Pulse Rate: 70 (04/20 1100) Intake/Output from previous day: 04/19 0701 - 04/20 0700 In: 1000 [IV Piggyback:1000] Out: -  Intake/Output from this shift: No intake/output data recorded. Vent settings for last 24 hours:    Labs: Potassium  Date Value Ref Range Status  01/28/2017 3.3 (L) 3.5 - 5.1 mmol/L Final  11/04/2014 3.6 3.5 - 5.1 mmol/L Final   Magnesium  Date Value Ref Range Status  08/04/2016 1.8 1.7 - 2.4 mg/dL Final  03/18/2014 1.9 mg/dL Final    Comment:    1.8-2.4 THERAPEUTIC RANGE: 4-7 mg/dL TOXIC: > 10 mg/dL  -----------------------       Recent Labs  01/28/17 0833  GLUCAP 168*    Microbiology: No results found for this or any previous visit (from the past 720 hour(s)).  Assessment: 41 y/o M with a known h/o familial hypertriglyceridemia and pancreatitis with multiple admissions for the same. Insulin drip ordered but not yet started and patient is hypokalemic at baseline.   Plan:  Will order K 40 meq po once and add K to IVF. Will check potassium regularly and add-on baseline magnesium.   Sergio [name] D 01/28/2017,12:14 PM

## 2017-01-28 NOTE — ED Notes (Signed)
Dr. Diamond in to see pt.  

## 2017-01-28 NOTE — ED Notes (Addendum)
Pt uprite on stretcher in exam room with no distress noted; pt reports mid upper abd pain accomp by N/V; resp even/unlab, lungs clear, apical audible & regular, +BS, abd soft/nondist, tender to right upper and mid upper abd; pt with hx of same; Dr Owens Shark at bedside; pt reports that he has not tried any pain meds at home and that normally only dilaudid will help

## 2017-01-28 NOTE — Progress Notes (Signed)
Pain control and stabilizing blood glucose prority. More hopeful this visit. Less depressed.

## 2017-01-28 NOTE — ED Triage Notes (Signed)
Pt ambulatory to triage in [facility], reports pancreatitis flare up over past few days with vomiting x 2 today, states was going to see doctor tomorrow but unable to make it due to pain.

## 2017-01-28 NOTE — Consult Note (Signed)
Discussed in detail with Dr Earleen Newport. Pt has recurrent severe hypertriglyceridemia and probable pancreatitis. Care plan reviewed. PCCM will assist in his management while in ICU  Merton Border, MD PCCM service Mobile [phone] Pager [phone] 01/28/2017

## 2017-01-28 NOTE — H&P (Signed)
Sergio [name] is an 41 y.o. male.   Chief Complaint: Abdominal pain HPI: The patient with past medical history of hypertriglyceridemia and diabetes who is well-known to the treatment team presents to emergency department complaining of nausea, vomiting and abdominal pain. The patient states that his symptoms began yesterday and has progressively worsened. He has noted skin lesions consistent with xanthomas arising over the last week. This is a recurrent problem. In the emergency department his serum sodium was found to be mildly low, but more concerning was his blood which was severely lipemic when drawn. Triglycerides are pending at time of admission but the patient has received multiple doses of analgesic without relief which prompted the emergency department staff to call the hospitalist service for admission.  Past Medical History:  Diagnosis Date  . Anxiety   . Chicken pox   . Depression   . Diabetes mellitus   . Familial hypertriglyceridemia    severe  . Gastric ulcer 2009  . HTN (hypertension)   . Morbid obesity ([place])    s/p bariatric sleeve surgery 01/2015  . Recurrent acute pancreatitis    secondary to hypertriglyceridemia   . Sleep apnea 1999   uses CPAP    Past Surgical History:  Procedure Laterality Date  . CHOLECYSTECTOMY    . LAPAROSCOPIC GASTRIC SLEEVE RESECTION    . [place] SURGERY  2008  . TONSILLECTOMY  2003  . VASECTOMY      Family History  Problem Relation Age of Onset  . Adopted: Yes  . Hypertension Mother   . Hyperlipidemia Mother   . Hypertension Father   . Hyperlipidemia Father    Social History:  reports that he has never smoked. He has never used smokeless tobacco. He reports that he does not drink alcohol or use drugs.  Allergies:  Allergies  Allergen Reactions  . Codeine Anaphylaxis  . Ivp Dye [Iodinated Diagnostic Agents] Other (See Comments)    Kidneys stop working    Prior to Admission medications   Medication Sig Start Date End  Date Taking? Authorizing Provider  ALPRAZolam Duanne Moron) 0.5 MG tablet Take 0.5 mg by mouth 3 (three) times daily as needed for anxiety.    Yes Historical Provider, MD  finasteride (PROSCAR) 5 MG tablet Take 1 tablet (5 mg total) by mouth daily. Patient taking differently: Take 5 mg by mouth at bedtime.  01/27/16  Yes Vaughan Basta, MD  gemfibrozil (LOPID) 600 MG tablet Take 1 tablet (600 mg total) by mouth 2 (two) times daily before a meal. 08/15/15  Yes Loletha Grayer, MD  insulin aspart (NOVOLOG) 100 UNIT/ML injection Inject 1 Dose into the skin See admin instructions. Use up to 120 units daily via insulin pump 09/11/15  Yes Historical Provider, MD  omega-3 acid ethyl esters (LOVAZA) 1 g capsule Take 4 g by mouth at bedtime.   Yes Historical Provider, MD  pregabalin (LYRICA) 300 MG capsule Take 300 mg by mouth at bedtime.    Yes Historical Provider, MD  rosuvastatin (CRESTOR) 10 MG tablet Take 1 tablet (10 mg total) by mouth daily at 6 PM. 08/04/16  Yes Demetrios Loll, MD  sertraline (ZOLOFT) 100 MG tablet Take 1 tablet (100 mg total) by mouth daily. Patient taking differently: Take 150 mg by mouth daily.  12/02/16  Yes Crecencio Mc, MD  tamsulosin (FLOMAX) 0.4 MG CAPS capsule Take 1 capsule (0.4 mg total) by mouth daily. Patient taking differently: Take 0.4 mg by mouth at bedtime.  01/27/16  Yes Vaibhavkumar  Anselm Jungling, MD  traZODone (DESYREL) 100 MG tablet Take 200 mg by mouth at bedtime.    Yes Historical Provider, MD     Results for orders placed or performed during the hospital encounter of 01/28/17 (from the past 48 hour(s))  Lipase, blood     Status: None   Collection Time: 01/28/17  1:49 AM  Result Value Ref Range   Lipase 43 11 - 51 U/L  Comprehensive metabolic panel     Status: Abnormal   Collection Time: 01/28/17  1:49 AM  Result Value Ref Range   Sodium 134 (L) 135 - 145 mmol/L    Comment: CORRECTED FOR LIPEMIA   Potassium 3.3 (L) 3.5 - 5.1 mmol/L   Chloride 92 (L) 101 - 111  mmol/L   CO2 23 22 - 32 mmol/L   Glucose, Bld 160 (H) 65 - 99 mg/dL   BUN 10 6 - 20 mg/dL   Creatinine, Ser 0.59 (L) 0.61 - 1.24 mg/dL   Calcium 9.1 8.9 - 10.3 mg/dL   Total Protein 6.6 6.5 - 8.1 g/dL   Albumin 4.7 3.5 - 5.0 g/dL   AST 34 15 - 41 U/L    Comment: CORRECTED FOR LIPEMIA   ALT 25 17 - 63 U/L    Comment: CORRECTED FOR LIPEMIA   Alkaline Phosphatase 43 38 - 126 U/L   Total Bilirubin 0.3 0.3 - 1.2 mg/dL   GFR calc non Af Amer >60 >60 mL/min   GFR calc Af Amer >60 >60 mL/min    Comment: (NOTE) The eGFR has been calculated using the CKD EPI equation. This calculation has not been validated in all clinical situations. eGFR's persistently <60 mL/min signify possible Chronic Kidney Disease.    Anion gap 19 (H) 5 - 15  CBC     Status: Abnormal   Collection Time: 01/28/17  1:49 AM  Result Value Ref Range   WBC 5.0 3.8 - 10.6 K/uL   RBC 5.31 4.40 - 5.90 MIL/uL   Hemoglobin UNABLE TO REPORT DUE TO LIPEMIC INTERFERENCE 13.0 - 18.0 g/dL   HCT 40.8 40.0 - 52.0 %    Comment: SPUN HCT   MCV 78.0 (L) 80.0 - 100.0 fL   MCH UNABLE TO REPORT DUE TO LIPEMIC INTERFERENCE 26.0 - 34.0 pg   MCHC UNABLE TO REPORT DUE TO LIPEMIC INTERFERENCE 32.0 - 36.0 g/dL   RDW 16.4 (H) 11.5 - 14.5 %   Platelets 177 150 - 440 K/uL   No results found.  Review of Systems  Constitutional: Negative for chills and fever.  HENT: Negative for sore throat and tinnitus.   Eyes: Negative for blurred vision and redness.  Respiratory: Negative for cough and shortness of breath.   Cardiovascular: Negative for chest pain, palpitations, orthopnea and PND.  Gastrointestinal: Positive for abdominal pain, nausea and vomiting. Negative for diarrhea.  Genitourinary: Negative for dysuria, frequency and urgency.  Musculoskeletal: Negative for joint pain and myalgias.  Skin: Negative for rash.       No lesions  Neurological: Negative for speech change, focal weakness and weakness.  Endo/Heme/Allergies: Does not  bruise/bleed easily.       No temperature intolerance  Psychiatric/Behavioral: Negative for depression and suicidal ideas.    Blood pressure 109/88, pulse 81, temperature 98.3 F (36.8 C), temperature source Oral, resp. rate 18, height _0  (1.803 m), weight 108.9 kg (240 lb), SpO2 93 %. Physical Exam  Nursing note and vitals reviewed. Constitutional: He is oriented to person, place, and time. He  appears well-developed and well-nourished. No distress.  HENT:  Head: Normocephalic and atraumatic.  Mouth/Throat: Oropharynx is clear and moist. No oropharyngeal exudate.  Eyes: Conjunctivae and EOM are normal. Pupils are equal, round, and reactive to light. No scleral icterus.  Neck: Normal range of motion. Neck supple. No JVD present. No tracheal deviation present. No thyromegaly present.  Cardiovascular: Normal rate and regular rhythm.  Exam reveals no gallop and no friction rub.   No murmur heard. Respiratory: Effort normal and breath sounds normal. No respiratory distress.  GI: Soft. Bowel sounds are normal. He exhibits no distension and no mass. There is tenderness. There is no rebound and no guarding.  Genitourinary:  Genitourinary Comments: Deferred  Musculoskeletal: Normal range of motion. He exhibits no edema.  Lymphadenopathy:    He has no cervical adenopathy.  Neurological: He is alert and oriented to person, place, and time. No cranial nerve deficit.  Skin: Skin is warm and dry. No rash noted. No erythema.  Psychiatric: He has a normal mood and affect. His behavior is normal. Judgment and thought content normal.     Assessment/Plan This is a 41 year old male admitted for chronic pancreatitis.  1. Chronic pancreatitis: Lipase is normal but pain is persistent. The patient continues to have nausea but has not vomited since being in the emergency department. Goal is to manage pain and decrease triglycerides which are likely etiology of his pancreatitis. 2. Hyperglyceridemia: Based  on appearance of blood draw triglycerides are likely chronically high. Start insulin drip. Admit to ICU. Continue gemfibrozil and high potency statin 3. Hyponatremia: Not as low as in the past. Replete with normal saline. Will improve with gradual reduction in triglycerides as well 4. Diabetes mellitus type 2: Monitor blood sugars. Insulin drip to maintain target blood sugar while lowering triglycerides. 5. BPH: Continue finasteride and tamsulosin 6. DVT prophylaxis: Lovenox 7. GI prophylaxis: None The patient is a full code. Time spent on admission orders and critical care approximately 45 minutes  Harrie Foreman, MD 01/28/2017, 6:58 AM

## 2017-01-28 NOTE — Progress Notes (Signed)
Patient ID: LEXTON HIDALGO, male   DOB: 1976/05/06, 41 y.o.   MRN: [id number]  [facility] PROGRESS NOTE  Sergio [name] DOB: 1976/03/07 DOA: 01/28/2017 PCP: Sergio Mc, MD  HPI/Subjective: Patient still having some abdominal pain. He felt it coming on for 2 days prior. With some abdominal pain. He usually goes to liquid diet on his own for a couple days.    Objective: Vitals:   01/28/17 1000 01/28/17 1100  BP: 105/76 110/70  Pulse: 69 70  Resp:  12  Temp:      Filed Weights   01/28/17 0151  Weight: 108.9 kg (240 lb)    ROS: Review of Systems  Constitutional: Negative for chills and fever.  Eyes: Negative for blurred vision.  Respiratory: Negative for cough and shortness of breath.   Cardiovascular: Negative for chest pain.  Gastrointestinal: Positive for abdominal pain and nausea. Negative for constipation, diarrhea and vomiting.  Genitourinary: Negative for dysuria.  Musculoskeletal: Negative for joint pain.  Neurological: Negative for dizziness and headaches.   Exam: Physical Exam  Constitutional: He is oriented to person, place, and time.  HENT:  Nose: No mucosal edema.  Mouth/Throat: No oropharyngeal exudate or posterior oropharyngeal edema.  Eyes: Conjunctivae, EOM and lids are normal. Pupils are equal, round, and reactive to light.  Neck: No JVD present. Carotid bruit is not present. No edema present. No thyroid mass and no thyromegaly present.  Cardiovascular: S1 normal and S2 normal.  Exam reveals no gallop.   No murmur heard. Pulses:      Dorsalis pedis pulses are 2+ on the right side, and 2+ on the left side.  Respiratory: No respiratory distress. He has no wheezes. He has no rhonchi. He has no rales.  GI: Soft. Bowel sounds are normal. There is tenderness in the epigastric area.  Musculoskeletal:       Right ankle: He exhibits no swelling.       Left ankle: He exhibits no swelling.  Lymphadenopathy:    He has no cervical  adenopathy.  Neurological: He is alert and oriented to person, place, and time. No cranial nerve deficit.  Skin: Skin is warm. No rash noted. Nails show no clubbing.  Psychiatric: He has a normal mood and affect.      Data Reviewed: Basic Metabolic Panel:  Recent Labs Lab 01/28/17 0149 01/28/17 0918  NA 134*  --   K 3.3*  --   CL 92*  --   CO2 23  --   GLUCOSE 160*  --   BUN 10  --   CREATININE 0.59*  --   CALCIUM 9.1  --   MG  --  2.7*   Liver Function Tests:  Recent Labs Lab 01/28/17 0149  AST 34  ALT 25  ALKPHOS 43  BILITOT 0.3  PROT 6.6  ALBUMIN 4.7    Recent Labs Lab 01/28/17 0149  LIPASE 43   CBC:  Recent Labs Lab 01/28/17 0149  WBC 5.0  HGB UNABLE TO REPORT DUE TO LIPEMIC INTERFERENCE  HCT 40.8  MCV 78.0*  PLT 177    CBG:  Recent Labs Lab 01/28/17 0833 01/28/17 1206 01/28/17 1311 01/28/17 1404  GLUCAP 168* 161* 161* 126*     Scheduled Meds: . docusate sodium  100 mg Oral BID  . enoxaparin (LOVENOX) injection  40 mg Subcutaneous Q24H  . finasteride  5 mg Oral QHS  . gemfibrozil  600 mg Oral BID AC  . lipase/protease/amylase  72,000 Units  Oral TID AC  . omega-3 acid ethyl esters  4 g Oral QHS  . pregabalin  300 mg Oral QHS  . rosuvastatin  10 mg Oral q1800  . sertraline  150 mg Oral Daily  . sodium chloride flush  3 mL Intravenous Q12H  . tamsulosin  0.4 mg Oral Daily  . traZODone  200 mg Oral QHS   Continuous Infusions: . dextrose 5 % and 0.45 % NaCl with KCl 40 mEq/L 125 mL/hr (01/28/17 1305)    Assessment/Plan:  1. Severe Hypertriglyceridemia. Insulin drip until triglycerides less than 1000. Patient on crestor, lovaza and gemfibril. 2. Acute on chronic Pancreatitis. Liquid diet. Creon trial. 3. Type 2 diabetes mellitus on insulin pump at home pump at home 4. Hyperlipidemia on crestor 5. Depression on zoloft  Code Status:     Code Status Orders        Start     Ordered   01/28/17 0841  Full code  Continuous      01/28/17 0840    Code Status History    Date Active Date Inactive Code Status Order ID Comments User Context   07/24/2016 10:51 PM 08/04/2016  6:37 PM Full Code [id number]  Theodoro Grist, MD Inpatient   03/11/2016  4:58 AM 03/19/2016  4:44 PM Full Code [id number]  Harrie Foreman, MD Inpatient   01/17/2016  2:50 AM 01/27/2016  5:47 PM Full Code [id number]  Harrie Foreman, MD Inpatient   08/01/2015 11:35 PM 08/15/2015  4:43 PM Full Code [id number]  Lytle Butte, MD ED   07/21/2014 11:46 AM 07/27/2014  6:16 PM Full Code [id number]  Oswald Hillock, MD Inpatient     Disposition Plan: TBD  Consultants:  Critical care specialist  Time spent: 24 minutes  [place], [facility]

## 2017-01-28 NOTE — Progress Notes (Signed)
Inpatient Diabetes Program Recommendations  AACE/ADA: New Consensus Statement on Inpatient Glycemic Control (2015)  Target Ranges:  Prepandial:   less than 140 mg/dL      Peak postprandial:   less than 180 mg/dL (1-2 hours)      Critically ill patients:  140 - 180 mg/dL   Lab Results  Component Value Date   GLUCAP 161 (H) 01/28/2017   HGBA1C 7.9 (H) 07/24/2016    Review of Glycemic Control  Diabetes history: Type 2 Outpatient Diabetes medications: Novolog insulin via insulin pump  Current orders for Inpatient glycemic control: IV insulin/ glucostabilizer order set @10  units/hour  Inpatient Diabetes Program Recommendations:   See note from Dr. Gabriel Carina from last admission in October 2017- "Recommend that we continue IV insulin rate of 12 units/hr and dose of D10 be adjusted to keep sugars in the 80-150 range."  Spoke to patient at the bedside.  Tells me he left his insulin pump at home.  Will need to have his insulin pump and all his supplies at the hospital when he is transitioned off IV insulin.  Gentry Fitz, RN, BA, MHA, CDE Diabetes Coordinator Inpatient Diabetes Program  [phone] (Team Pager) [phone] ([place]) 01/28/2017 2:59 PM

## 2017-01-28 NOTE — ED Notes (Signed)
Pt uprite on stretcher in darkened exam room, resting quietly with no distress noted; pt updated on plan of care

## 2017-01-28 NOTE — ED Notes (Signed)
Pt c/o pain. Looked in sign and held orders, offered pt tylenol. Pt declines. Paged MD for pain order. MD will put something in for pain.

## 2017-01-28 NOTE — ED Provider Notes (Signed)
[facility] Emergency Department Provider Note   First MD Initiated Contact with Patient 01/28/17 0300     (approximate)  I have reviewed the triage vital signs and the nursing notes.   HISTORY  Chief Complaint Abdominal Pain    HPI Sergio [name] is a 41 y.o. male with below list of chronic medical conditions including familial hypertriglyceridemia and pancreatitis presents to the emergency department with 2 day history of 10 out of 10 epigastric pain accompanied by nonbloody vomiting. Patient denies any fever or febrile on presentation temperature 98.3. Patient denies any diarrhea or urinary symptoms. Patient states that he has done a liquid diet for the past 2 days since onset of symptoms however pain has persisted and worsened. Patient denies any alleviating factors.   Past Medical History:  Diagnosis Date  . Anxiety   . Chicken pox   . Depression   . Diabetes mellitus   . Familial hypertriglyceridemia    severe  . Gastric ulcer 2009  . HTN (hypertension)   . Morbid obesity ([place])    s/p bariatric sleeve surgery 01/2015  . Recurrent acute pancreatitis    secondary to hypertriglyceridemia   . Sleep apnea 1999   uses CPAP    Patient Active Problem List   Diagnosis Date Noted  . Hospital discharge follow-up 08/10/2016  . Sixth nerve palsy of left eye   . Chronic pancreatitis ([place]) 07/24/2016  . Hyponatremia 07/24/2016  . Encounter for preventive health examination 05/01/2016  . Depression, major, recurrent, moderate ([place]) 03/14/2016  . S/P bariatric surgery 02/01/2015  . S/P laparoscopic cholecystectomy 02/01/2015  . Diarrhea 05/29/2012  . Recurrent pancreatitis ([place]) 05/29/2012  . Generalized anxiety disorder 04/29/2012  . Obesity (BMI 30-39.9) 04/29/2012  . DM (diabetes mellitus), type 2, uncontrolled w/neurologic complication ([place])   . Hypertriglyceridemia   . HTN (hypertension)   . Sleep apnea     Past Surgical History:  Procedure  Laterality Date  . CHOLECYSTECTOMY    . LAPAROSCOPIC GASTRIC SLEEVE RESECTION    . [place] SURGERY  2008  . TONSILLECTOMY  2003  . VASECTOMY      Prior to Admission medications   Medication Sig Start Date End Date Taking? Authorizing Provider  ALPRAZolam Duanne Moron) 0.5 MG tablet Take 0.5 mg by mouth 3 (three) times daily as needed for anxiety.    Yes Historical Provider, MD  finasteride (PROSCAR) 5 MG tablet Take 1 tablet (5 mg total) by mouth daily. Patient taking differently: Take 5 mg by mouth at bedtime.  01/27/16  Yes Vaughan Basta, MD  gemfibrozil (LOPID) 600 MG tablet Take 1 tablet (600 mg total) by mouth 2 (two) times daily before a meal. 08/15/15  Yes Loletha Grayer, MD  insulin aspart (NOVOLOG) 100 UNIT/ML injection Inject 1 Dose into the skin See admin instructions. Use up to 120 units daily via insulin pump 09/11/15  Yes Historical Provider, MD  omega-3 acid ethyl esters (LOVAZA) 1 g capsule Take 4 g by mouth at bedtime.   Yes Historical Provider, MD  pregabalin (LYRICA) 300 MG capsule Take 300 mg by mouth at bedtime.    Yes Historical Provider, MD  rosuvastatin (CRESTOR) 10 MG tablet Take 1 tablet (10 mg total) by mouth daily at 6 PM. 08/04/16  Yes Demetrios Loll, MD  sertraline (ZOLOFT) 100 MG tablet Take 1 tablet (100 mg total) by mouth daily. Patient taking differently: Take 150 mg by mouth daily.  12/02/16  Yes Crecencio Mc, MD  tamsulosin (  FLOMAX) 0.4 MG CAPS capsule Take 1 capsule (0.4 mg total) by mouth daily. Patient taking differently: Take 0.4 mg by mouth at bedtime.  01/27/16  Yes Vaughan Basta, MD  traZODone (DESYREL) 100 MG tablet Take 200 mg by mouth at bedtime.    Yes Historical Provider, MD    Allergies Codeine and Ivp dye [iodinated diagnostic agents]  Family History  Problem Relation Age of Onset  . Adopted: Yes  . Hypertension Mother   . Hyperlipidemia Mother   . Hypertension Father   . Hyperlipidemia Father     Social History Social  History  Substance Use Topics  . Smoking status: Never Smoker  . Smokeless tobacco: Never Used  . Alcohol use No     Comment: once month    Review of Systems Constitutional: No fever/chills Eyes: No visual changes. ENT: No sore throat. Cardiovascular: Denies chest pain. Respiratory: Denies shortness of breath. Gastrointestinal: Positive for abdominal pain and vomiting  Genitourinary: Negative for dysuria. Musculoskeletal: Negative for back pain. Skin: Negative for rash. Neurological: Negative for headaches, focal weakness or numbness.  10-point ROS otherwise negative.  ____________________________________________   PHYSICAL EXAM:  VITAL SIGNS: ED Triage Vitals [01/28/17 0151]  Enc Vitals Group     BP (!) 136/95     Pulse Rate 86     Resp 16     Temp 98.3 F (36.8 C)     Temp Source Oral     SpO2 97 %     Weight 240 lb (108.9 kg)     Height 5\' 11"  (1.803 m)     Head Circumference      Peak Flow      Pain Score 9     Pain Loc      Pain Edu?      Excl. in [place]?     Constitutional: Alert and oriented. Well appearing and in no acute distress. Eyes: Conjunctivae are normal. PERRL. EOMI. Head: Atraumatic. Mouth/Throat: Mucous membranes are moist.  Oropharynx non-erythematous. Neck: No stridor.  Cardiovascular: Normal rate, regular rhythm. Good peripheral circulation. Grossly normal heart sounds. Respiratory: Normal respiratory effort.  No retractions. Lungs CTAB. Gastrointestinal: Epigastric tenderness to palpation. No distention.  Musculoskeletal: No lower extremity tenderness nor edema. No gross deformities of extremities. Neurologic:  Normal speech and language. No gross focal neurologic deficits are appreciated.  Skin:  Skin is warm, dry and intact. No rash noted.  ____________________________________________   LABS (all labs ordered are listed, but only abnormal results are displayed)  Labs Reviewed  COMPREHENSIVE METABOLIC PANEL - Abnormal; Notable for  the following:       Result Value   Sodium 134 (*)    Potassium 3.3 (*)    Chloride 92 (*)    Glucose, Bld 160 (*)    Creatinine, Ser 0.59 (*)    Anion gap 19 (*)    All other components within normal limits  CBC - Abnormal; Notable for the following:    MCV 78.0 (*)    RDW 16.4 (*)    All other components within normal limits  LIPASE, BLOOD  URINALYSIS, COMPLETE (UACMP) WITH MICROSCOPIC  TRIGLYCERIDES        Procedures   ____________________________________________   INITIAL IMPRESSION / ASSESSMENT AND PLAN / ED COURSE  Pertinent labs & imaging results that were available during my care of the patient were reviewed by me and considered in my medical decision making (see chart for details).  Patient received IV Dilaudid and Zofran with minimal improvement  of pain and a such Dilaudid was repeated however patient's pain persist. History of physical exam consistent with acute exacerbation of chronic pancreatitis.  awaiting triglyceride level. Patient discussed with Dr. Marcille Blanco for hospital admission.      ____________________________________________  FINAL CLINICAL IMPRESSION(S) / ED DIAGNOSES  Final diagnoses:  Other chronic pancreatitis (HCC)  Hypertriglyceridemia   MEDICATIONS GIVEN DURING THIS VISIT:  Medications  ondansetron (ZOFRAN) injection 4 mg (4 mg Intravenous Given 01/28/17 0157)  sodium chloride 0.9 % bolus 1,000 mL (0 mLs Intravenous Stopped 01/28/17 0533)  HYDROmorphone (DILAUDID) injection 1 mg (1 mg Intravenous Given 01/28/17 0333)  ondansetron (ZOFRAN) injection 4 mg (4 mg Intravenous Given 01/28/17 0333)  HYDROmorphone (DILAUDID) injection 1 mg (1 mg Intravenous Given 01/28/17 0533)     NEW OUTPATIENT MEDICATIONS STARTED DURING THIS VISIT:  New Prescriptions   No medications on file    Modified Medications   No medications on file    Discontinued Medications   CLONAZEPAM (KLONOPIN) 0.5 MG TABLET    Take 0.5 tablets (0.25 mg total) by  mouth 2 (two) times daily as needed for anxiety.   ONDANSETRON (ZOFRAN) 4 MG TABLET    Take 1 tablet (4 mg total) by mouth every 8 (eight) hours as needed for nausea or vomiting.   OXYCODONE 10 MG TABS    Take 1 tablet (10 mg total) by mouth every 6 (six) hours as needed for moderate pain or breakthrough pain.   VITAMIN B-12 1000 MCG TABLET    Take 1 tablet (1,000 mcg total) by mouth daily.     Note:  This document was prepared using Dragon voice recognition software and may include unintentional dictation errors.    Gregor Hams, MD 01/28/17 [id number]

## 2017-01-28 NOTE — Progress Notes (Signed)
[place] NOTE  Pharmacy Consult for Electrolyte Monitoring  Indication: Insulin drip for HTGP   Allergies  Allergen Reactions  . Codeine Anaphylaxis  . Ivp Dye [Iodinated Diagnostic Agents] Other (See Comments)    Kidneys stop working    Patient Measurements: Height: 5\' 11"  (180.3 cm) Weight: 240 lb (108.9 kg) IBW/kg (Calculated) : 75.3  Vital Signs: Temp: 97.8 F (36.6 C) (04/20 0800) BP: 120/84 (04/20 1500) Pulse Rate: 73 (04/20 1500) Intake/Output from previous day: 04/19 0701 - 04/20 0700 In: 1000 [IV Piggyback:1000] Out: -  Intake/Output from this shift: Total I/O In: -  Out: 600 [Urine:600] Vent settings for last 24 hours:    Labs: Potassium  Date Value Ref Range Status  01/28/2017 3.3 (L) 3.5 - 5.1 mmol/L Final  11/04/2014 3.6 3.5 - 5.1 mmol/L Final   Magnesium  Date Value Ref Range Status  01/28/2017 2.7 (H) 1.7 - 2.4 mg/dL Final  03/18/2014 1.9 mg/dL Final    Comment:    1.8-2.4 THERAPEUTIC RANGE: 4-7 mg/dL TOXIC: > 10 mg/dL  -----------------------       Recent Labs  01/28/17 1311 01/28/17 1404 01/28/17 1531  GLUCAP 161* 126* 112*    Microbiology: No results found for this or any previous visit (from the past 720 hour(s)).  Assessment: 41 y/o M with a known h/o familial hypertriglyceridemia and pancreatitis with multiple admissions for the same. Insulin drip ordered but not yet started and patient is hypokalemic at baseline.   Plan:  Potassium added to titratable IVF. May need to remove K from fluids depending on rate/ K level. Will check BMET q 6 hours x 24 hours and determine subsequent schedule 4/21.   Ulice Dash D 01/28/2017,3:39 PM

## 2017-01-29 LAB — GLUCOSE, CAPILLARY
Glucose-Capillary: 65 mg/dL (ref 65–99)
Glucose-Capillary: 180 mg/dL — ABNORMAL HIGH (ref 65–99)
Glucose-Capillary: 151 mg/dL — ABNORMAL HIGH (ref 65–99)
Glucose-Capillary: 127 mg/dL — ABNORMAL HIGH (ref 65–99)
Glucose-Capillary: 110 mg/dL — ABNORMAL HIGH (ref 65–99)
Glucose-Capillary: 94 mg/dL (ref 65–99)
Glucose-Capillary: 119 mg/dL — ABNORMAL HIGH (ref 65–99)
Glucose-Capillary: 104 mg/dL — ABNORMAL HIGH (ref 65–99)
Glucose-Capillary: 76 mg/dL (ref 65–99)
Glucose-Capillary: 98 mg/dL (ref 65–99)
Glucose-Capillary: 99 mg/dL (ref 65–99)

## 2017-01-29 LAB — TRIGLYCERIDES: Triglycerides: >5000 mg/dL — ABNORMAL HIGH (ref <150)

## 2017-01-29 LAB — HIV ANTIBODY (ROUTINE TESTING W REFLEX): HIV Screen 4th Generation wRfx: NONREACTIVE

## 2017-01-29 LAB — BASIC METABOLIC PANEL
ANION GAP: 6 (ref 5–15)
BUN: 7 mg/dL (ref 6–20)
CO2: 25 mmol/L (ref 22–32)
Calcium: 8.1 mg/dL — ABNORMAL LOW (ref 8.9–10.3)
Chloride: 98 mmol/L — ABNORMAL LOW (ref 101–111)
Creatinine, Ser: 0.72 mg/dL (ref 0.61–1.24)
GLUCOSE: 127 mg/dL — AB (ref 65–99)
POTASSIUM: 3.6 mmol/L (ref 3.5–5.1)
Sodium: 129 mmol/L — ABNORMAL LOW (ref 135–145)

## 2017-01-29 LAB — LIPASE, BLOOD: LIPASE: 32 U/L (ref 11–51)

## 2017-01-29 LAB — HEMOGLOBIN A1C
Hgb A1c MFr Bld: 10.3 % — ABNORMAL HIGH (ref 4.8–5.6)
Mean Plasma Glucose: 249 mg/dL

## 2017-01-29 MED ORDER — OXYCODONE HCL 5 MG PO TABS
5.0000 mg | ORAL_TABLET | ORAL | Status: DC | PRN
  Administered 2017-01-31: 5 mg via ORAL
  Filled 2017-01-29 (×5): qty 1

## 2017-01-29 MED ORDER — SODIUM CHLORIDE 4 MEQ/ML IV SOLN
INTRAVENOUS | Status: DC
  Administered 2017-01-29 – 2017-01-31 (×8): via INTRAVENOUS
  Filled 2017-01-29 (×12): qty 1000

## 2017-01-29 NOTE — Progress Notes (Signed)
Patient refused a CHG bath/ wipe down due to preferring his wife to give him baths while staying in hospital. Patient stated his wife would be assisting him with bath tomorrow (01/30/17). Patient is alert and oriented and has been educated on CHG bath/wipe benefits. RN notified.

## 2017-01-29 NOTE — Plan of Care (Signed)
Problem: Pain Managment: Goal: General experience of comfort will improve Outcome: Progressing Patient has received pain medications times two.  Slept off and on after receiving oxycodone.  Problem: Physical Regulation: Goal: Ability to maintain clinical measurements within normal limits will improve Outcome: Progressing Vital signs stable  Problem: Fluid Volume: Goal: Ability to maintain a balanced intake and output will improve Outcome: Progressing Taking fluids well. Adequate output.

## 2017-01-29 NOTE — Progress Notes (Signed)
Patient ID: Sergio [name], male   DOB: Aug 20, 1976, 41 y.o.   MRN: [id number]  [facility] PROGRESS NOTE  Sergio [name] VOZ:[id number] DOB: May 23, 1976 DOA: 01/28/2017 PCP: Crecencio Mc, MD  HPI/Subjective: Patient is complaining of 9 out of 10 abdominal pain, tolerating clear liquids . Wife at bedside  Objective: Vitals:   01/29/17 1000 01/29/17 1100  BP: 121/79 124/82  Pulse: 80 72  Resp: 14 11  Temp:      Filed Weights   01/28/17 0151 01/29/17 0500  Weight: 108.9 kg (240 lb) 111.8 kg (246 lb 7.6 oz)    ROS: Review of Systems  Constitutional: Negative for chills and fever.  Eyes: Negative for blurred vision.  Respiratory: Negative for cough and shortness of breath.   Cardiovascular: Negative for chest pain.  Gastrointestinal: Positive for abdominal pain and nausea. Negative for constipation, diarrhea and vomiting.  Genitourinary: Negative for dysuria.  Musculoskeletal: Negative for joint pain.  Neurological: Negative for dizziness and headaches.   Exam: Physical Exam  Constitutional: He is oriented to person, place, and time.  HENT:  Nose: No mucosal edema.  Mouth/Throat: No oropharyngeal exudate or posterior oropharyngeal edema.  Eyes: Conjunctivae, EOM and lids are normal. Pupils are equal, round, and reactive to light.  Neck: No JVD present. Carotid bruit is not present. No edema present. No thyroid mass and no thyromegaly present.  Cardiovascular: S1 normal and S2 normal.  Exam reveals no gallop.   No murmur heard. Pulses:      Dorsalis pedis pulses are 2+ on the right side, and 2+ on the left side.  Respiratory: No respiratory distress. He has no wheezes. He has no rhonchi. He has no rales.  GI: Soft. Bowel sounds are normal. There is tenderness in the epigastric area.  Musculoskeletal:       Right ankle: He exhibits no swelling.       Left ankle: He exhibits no swelling.  Lymphadenopathy:    He has no cervical adenopathy.  Neurological: He is alert  and oriented to person, place, and time. No cranial nerve deficit.  Skin: Skin is warm. No rash noted. Nails show no clubbing.  Psychiatric: He has a normal mood and affect.      Data Reviewed: Basic Metabolic Panel:  Recent Labs Lab 01/28/17 0149 01/28/17 0918 01/28/17 1543 01/28/17 2245 01/29/17 0426  NA 134*  --  128* 128* 129*  K 3.3*  --  3.7 5.4* 3.6  CL 92*  --  97* 98* 98*  CO2 23  --  24 26 25   GLUCOSE 160*  --  99 87 127*  BUN 10  --  10 7 7   CREATININE 0.59*  --  0.44* 1.19 0.72  CALCIUM 9.1  --  8.2* 8.0* 8.1*  MG  --  2.7*  --   --   --    Liver Function Tests:  Recent Labs Lab 01/28/17 0149  AST 34  ALT 25  ALKPHOS 43  BILITOT 0.3  PROT 6.6  ALBUMIN 4.7    Recent Labs Lab 01/28/17 0149 01/29/17 0426  LIPASE 43 32   CBC:  Recent Labs Lab 01/28/17 0149  WBC 5.0  HGB UNABLE TO REPORT DUE TO LIPEMIC INTERFERENCE  HCT 40.8  MCV 78.0*  PLT 177    CBG:  Recent Labs Lab 01/29/17 0153 01/29/17 0413 01/29/17 0616 01/29/17 0739 01/29/17 1115  GLUCAP 180* 151* 127* 110* 94     Scheduled Meds: . docusate sodium  100  mg Oral BID  . enoxaparin (LOVENOX) injection  40 mg Subcutaneous Q24H  . finasteride  5 mg Oral QHS  . gemfibrozil  600 mg Oral BID AC  . lipase/protease/amylase  72,000 Units Oral TID AC  . omega-3 acid ethyl esters  4 g Oral QHS  . pregabalin  300 mg Oral QHS  . rosuvastatin  10 mg Oral q1800  . sertraline  150 mg Oral Daily  . sodium chloride flush  3 mL Intravenous Q12H  . tamsulosin  0.4 mg Oral QHS  . traZODone  200 mg Oral QHS   Continuous Infusions: . dextrose 10 % (D10) with NaCl and/or heparin NICU IV infusion 150 mL/hr at 01/29/17 1116  . insulin (NOVOLIN-R) infusion 10 Units/hr (01/29/17 1117)    Assessment/Plan:  1. Severe Hypertriglyceridemia. constinue Insulin drip until triglycerides less than 1000. Patient on crestor, lovaza and gemfibril. Patient sees endocrinology Dr. Gabriel Carina as an outpatient.  Pain management as needed 2. Acute on chronic Pancreatitis. Liquid diet. Creon trial. 3. Type 2 diabetes mellitus on insulin drip at this time, on insulin pump at home 4. Hyperlipidemia on crestor 5. Depression on zoloft  Code Status:     Code Status Orders        Start     Ordered   01/28/17 0841  Full code  Continuous     01/28/17 0840    Code Status History    Date Active Date Inactive Code Status Order ID Comments User Context   07/24/2016 10:51 PM 08/04/2016  6:37 PM Full Code [id number]  Theodoro Grist, MD Inpatient   03/11/2016  4:58 AM 03/19/2016  4:44 PM Full Code [id number]  Harrie Foreman, MD Inpatient   01/17/2016  2:50 AM 01/27/2016  5:47 PM Full Code [id number]  Harrie Foreman, MD Inpatient   08/01/2015 11:35 PM 08/15/2015  4:43 PM Full Code [id number]  Lytle Butte, MD ED   07/21/2014 11:46 AM 07/27/2014  6:16 PM Full Code [id number]  Oswald Hillock, MD Inpatient     Disposition Plan: TBD  Consultants:  Critical care specialist  Time spent: 33 minutes  Mehar Kirkwood, [employer]

## 2017-01-29 NOTE — Progress Notes (Addendum)
[place] NOTE  Pharmacy Consult for Electrolyte Monitoring  Indication: Insulin drip for HTGP   Allergies  Allergen Reactions  . Codeine Anaphylaxis  . Ivp Dye [Iodinated Diagnostic Agents] Other (See Comments)    Kidneys stop working    Patient Measurements: Height: 5\' 11"  (180.3 cm) Weight: 240 lb (108.9 kg) IBW/kg (Calculated) : 75.3  Vital Signs: Temp: 97.9 F (36.6 C) (04/20 2000) Temp Source: Oral (04/20 2000) BP: 126/81 (04/20 1826) Pulse Rate: 77 (04/20 1826) Intake/Output from previous day: 04/20 0701 - 04/21 0700 In: 1910 [I.V.:910] Out: 1600 [Urine:1600] Intake/Output from this shift: Total I/O In: 640 [I.V.:640] Out: 1000 [Urine:1000] Vent settings for last 24 hours:    Labs: Potassium  Date Value Ref Range Status  01/28/2017 5.4 (H) 3.5 - 5.1 mmol/L Final  11/04/2014 3.6 3.5 - 5.1 mmol/L Final   Magnesium  Date Value Ref Range Status  01/28/2017 2.7 (H) 1.7 - 2.4 mg/dL Final  03/18/2014 1.9 mg/dL Final    Comment:    1.8-2.4 THERAPEUTIC RANGE: 4-7 mg/dL TOXIC: > 10 mg/dL  -----------------------       Recent Labs  01/28/17 2002 01/28/17 2125 01/28/17 2346  GLUCAP 166* 143* 103*    Microbiology: No results found for this or any previous visit (from the past 720 hour(s)).  Assessment: 41 y/o M with a known h/o familial hypertriglyceridemia and pancreatitis with multiple admissions for the same. Insulin drip ordered but not yet started and patient is hypokalemic at baseline.   Plan:  Potassium added to titratable IVF. May need to remove K from fluids depending on rate/ K level. Will check BMET q 6 hours x 24 hours and determine subsequent schedule 4/21.   4/20 23:00 K+ 5.4. Fluids reordered with no added K+. Recheck K+ at next interval.  4/21 0430 K+ 3.6. No supplement ordered. Recheck at next interval.  Sergio [name] S 01/29/2017,12:31 AM

## 2017-01-29 NOTE — Progress Notes (Signed)
[id number] Complains of nausea and pain. Given prns

## 2017-01-29 NOTE — Progress Notes (Signed)
No complaints, no distress, c/o abdominal pain TGs remain markedly elevated Exam essentially normal   IMPRESSION: Severe hypertriglyceridemia Chronic pancreatitis DM 2  PLAN/REC: Cont insulin gtt Continue monitoring TGs daily PRN analgesia ordered Discussed with Dr Vista Deck, MD PCCM service Mobile [phone] Pager [phone] 01/29/2017

## 2017-01-29 NOTE — Progress Notes (Signed)
No change. Tolerating his insulin and D10 well. Discouraged that his triglyerides are not less than 5000. Pain medication x 5  and antiemetic x 1.

## 2017-01-30 DIAGNOSIS — K861 Other chronic pancreatitis: Secondary | ICD-10-CM

## 2017-01-30 DIAGNOSIS — E781 Pure hyperglyceridemia: Principal | ICD-10-CM

## 2017-01-30 LAB — BASIC METABOLIC PANEL
Anion gap: 9 (ref 5–15)
CHLORIDE: 102 mmol/L (ref 101–111)
CO2: 22 mmol/L (ref 22–32)
CREATININE: 0.87 mg/dL (ref 0.61–1.24)
Calcium: 8.3 mg/dL — ABNORMAL LOW (ref 8.9–10.3)
GFR calc Af Amer: >60 mL/min (ref >60)
GFR calc non Af Amer: >60 mL/min (ref >60)
Glucose, Bld: 137 mg/dL — ABNORMAL HIGH (ref 65–99)
POTASSIUM: 4.4 mmol/L (ref 3.5–5.1)
Sodium: 133 mmol/L — ABNORMAL LOW (ref 135–145)

## 2017-01-30 LAB — CBC
HEMATOCRIT: 38.9 % — AB (ref 40.0–52.0)
Hemoglobin: 14 g/dL (ref 13.0–18.0)
MCH: 28.8 pg (ref 26.0–34.0)
MCHC: 36.1 g/dL — ABNORMAL HIGH (ref 32.0–36.0)
MCV: 79.8 fL — AB (ref 80.0–100.0)
PLATELETS: 128 10*3/uL — AB (ref 150–440)
RBC: 4.87 MIL/uL (ref 4.40–5.90)
RDW: 16.5 % — AB (ref 11.5–14.5)
WBC: 3.7 10*3/uL — AB (ref 3.8–10.6)

## 2017-01-30 LAB — GLUCOSE, CAPILLARY
Glucose-Capillary: 104 mg/dL — ABNORMAL HIGH (ref 65–99)
Glucose-Capillary: 114 mg/dL — ABNORMAL HIGH (ref 65–99)
Glucose-Capillary: 115 mg/dL — ABNORMAL HIGH (ref 65–99)
Glucose-Capillary: 124 mg/dL — ABNORMAL HIGH (ref 65–99)
Glucose-Capillary: 130 mg/dL — ABNORMAL HIGH (ref 65–99)
Glucose-Capillary: 133 mg/dL — ABNORMAL HIGH (ref 65–99)
Glucose-Capillary: 143 mg/dL — ABNORMAL HIGH (ref 65–99)
Glucose-Capillary: 88 mg/dL (ref 65–99)
Glucose-Capillary: 96 mg/dL (ref 65–99)
Glucose-Capillary: 97 mg/dL (ref 65–99)

## 2017-01-30 LAB — TRIGLYCERIDES: Triglycerides: 3523 mg/dL — ABNORMAL HIGH (ref <150)

## 2017-01-30 MED ORDER — LOPERAMIDE HCL 2 MG PO CAPS: 2.0000 mg | ORAL_CAPSULE | ORAL | Status: DC | PRN

## 2017-01-30 MED ORDER — SALINE SPRAY 0.65 % NA SOLN
1.0000 | NASAL | Status: DC | PRN
  Filled 2017-01-30: qty 44

## 2017-01-30 NOTE — Progress Notes (Signed)
No complaints, no distress, c/o abdominal pain TGs remain markedly elevated Exam essentially normal   IMPRESSION: Severe hypertriglyceridemia Chronic pancreatitis DM 2  PLAN/REC: Cont insulin gtt until TGs < 1000 Advance activity Continue monitoring TGs daily PRN analgesia   Merton Border, MD PCCM service Mobile [phone] Pager [phone] 01/30/2017

## 2017-01-30 NOTE — Progress Notes (Signed)
[place] NOTE  Pharmacy Consult for Electrolyte Monitoring  Indication: Insulin drip for HTGP   Allergies  Allergen Reactions  . Codeine Anaphylaxis  . Ivp Dye [Iodinated Diagnostic Agents] Other (See Comments)    Kidneys stop working    Patient Measurements: Height: 5\' 11"  (180.3 cm) Weight: 248 lb 7.3 oz (112.7 kg) IBW/kg (Calculated) : 75.3  Vital Signs: Temp: 99.1 F (37.3 C) (04/22 0700) BP: 103/65 (04/22 0700) Pulse Rate: 76 (04/22 0700) Intake/Output from previous day: 04/21 0701 - 04/22 0700 In: 3963 [P.O.:450; I.V.:3513] Out: 4800 [Urine:4800] Intake/Output from this shift: Total I/O In: 170 [I.V.:170] Out: 700 [Urine:700] Vent settings for last 24 hours:    Labs: Potassium  Date Value Ref Range Status  01/30/2017 4.4 3.5 - 5.1 mmol/L Final  11/04/2014 3.6 3.5 - 5.1 mmol/L Final   Magnesium  Date Value Ref Range Status  01/28/2017 2.7 (H) 1.7 - 2.4 mg/dL Final  03/18/2014 1.9 mg/dL Final    Comment:    1.8-2.4 THERAPEUTIC RANGE: 4-7 mg/dL TOXIC: > 10 mg/dL  -----------------------       Recent Labs  01/30/17 0208 01/30/17 0421 01/30/17 0752  GLUCAP 124* 130* 133*    Assessment: 41 y/o M with a known h/o familial hypertriglyceridemia and pancreatitis with multiple admissions for the same. Insulin drip ordered; patient was hypokalemic at baseline.   Plan:  4/22 0418,  K = 4.4.  No supplementation ordered. Recheck with am labs.  Olivia Canter, [facility] Clinical Pharmacist 01/30/2017,9:16 AM

## 2017-01-30 NOTE — Progress Notes (Signed)
Better day. Trigylcerides are decreasing.Patient more hopeful. Diarrhea x 4 today.

## 2017-01-30 NOTE — Progress Notes (Signed)
Patient ID: Sergio [name], male   DOB: 12-21-1975, 41 y.o.   MRN: [id number]  [employer] PROGRESS NOTE  Sergio [name] LGX:[id number] DOB: 05/12/1976 DOA: 01/28/2017 PCP: Crecencio Mc, MD  HPI/Subjective: Patient'S abd pain is much better tolerating clear liquids .   Objective: Vitals:   01/29/17 1800 01/29/17 2025  BP: 118/71   Pulse: 74   Resp: 10   Temp:  98.8 F (37.1 C)    Filed Weights   01/28/17 0151 01/29/17 0500 01/30/17 0500  Weight: 108.9 kg (240 lb) 111.8 kg (246 lb 7.6 oz) 112.7 kg (248 lb 7.3 oz)    ROS: Review of Systems  Constitutional: Negative for chills and fever.  Eyes: Negative for blurred vision.  Respiratory: Negative for cough and shortness of breath.   Cardiovascular: Negative for chest pain.  Gastrointestinal: Positive for abdominal pain. Negative for constipation, diarrhea, nausea and vomiting.  Genitourinary: Negative for dysuria.  Musculoskeletal: Negative for joint pain.  Neurological: Negative for dizziness and headaches.   Exam: Physical Exam  Constitutional: He is oriented to person, place, and time.  HENT:  Nose: No mucosal edema.  Mouth/Throat: No oropharyngeal exudate or posterior oropharyngeal edema.  Eyes: Conjunctivae, EOM and lids are normal. Pupils are equal, round, and reactive to light.  Neck: No JVD present. Carotid bruit is not present. No edema present. No thyroid mass and no thyromegaly present.  Cardiovascular: S1 normal and S2 normal.  Exam reveals no gallop.   No murmur heard. Pulses:      Dorsalis pedis pulses are 2+ on the right side, and 2+ on the left side.  Respiratory: No respiratory distress. He has no wheezes. He has no rhonchi. He has no rales.  GI: Soft. Bowel sounds are normal. There is tenderness in the epigastric area.  Musculoskeletal:       Right ankle: He exhibits no swelling.       Left ankle: He exhibits no swelling.  Lymphadenopathy:    He has no cervical adenopathy.  Neurological: He is  alert and oriented to person, place, and time. No cranial nerve deficit.  Skin: Skin is warm. No rash noted. Nails show no clubbing.  Psychiatric: He has a normal mood and affect.      Data Reviewed: Basic Metabolic Panel:  Recent Labs Lab 01/28/17 0149 01/28/17 8144 01/28/17 1543 01/28/17 2245 01/29/17 0426 01/30/17 0418  NA 134*  --  128* 128* 129* 133*  K 3.3*  --  3.7 5.4* 3.6 4.4  CL 92*  --  97* 98* 98* 102  CO2 23  --  24 26 25 22   GLUCOSE 160*  --  99 87 127* 137*  BUN 10  --  10 7 7  <5*  CREATININE 0.59*  --  0.44* 1.19 0.72 0.87  CALCIUM 9.1  --  8.2* 8.0* 8.1* 8.3*  MG  --  2.7*  --   --   --   --    Liver Function Tests:  Recent Labs Lab 01/28/17 0149  AST 34  ALT 25  ALKPHOS 43  BILITOT 0.3  PROT 6.6  ALBUMIN 4.7    Recent Labs Lab 01/28/17 0149 01/29/17 0426  LIPASE 43 32   CBC:  Recent Labs Lab 01/28/17 0149 01/30/17 0418  WBC 5.0 3.7*  HGB UNABLE TO REPORT DUE TO LIPEMIC INTERFERENCE 14.0  HCT 40.8 38.9*  MCV 78.0* 79.8*  PLT 177 128*    CBG:  Recent Labs Lab 01/29/17 1947 01/29/17 2157  01/30/17 0003 01/30/17 0208 01/30/17 0421  GLUCAP 98 99 143* 124* 130*     Scheduled Meds: . docusate sodium  100 mg Oral BID  . enoxaparin (LOVENOX) injection  40 mg Subcutaneous Q24H  . finasteride  5 mg Oral QHS  . gemfibrozil  600 mg Oral BID AC  . lipase/protease/amylase  72,000 Units Oral TID AC  . omega-3 acid ethyl esters  4 g Oral QHS  . pregabalin  300 mg Oral QHS  . rosuvastatin  10 mg Oral q1800  . sertraline  150 mg Oral Daily  . sodium chloride flush  3 mL Intravenous Q12H  . tamsulosin  0.4 mg Oral QHS  . traZODone  200 mg Oral QHS   Continuous Infusions: . dextrose 10 % (D10) with NaCl and/or heparin NICU IV infusion 150 mL/hr at 01/29/17 1723  . insulin (NOVOLIN-R) infusion 10 Units/hr (01/29/17 1117)    Assessment/Plan:  1. Severe Hypertriglyceridemia. continue Insulin drip until triglycerides less than  1000.Triglycerides greater than 5000--3500 today. Patient on crestor, lovaza and gemfibril. Patient sees endocrinology Sergio [name] as an outpatient. Pain management as needed. No history of alcoholism according to patient 2. Acute on chronic Pancreatitis. Liquid diet. Creon trial. 3. Type 2 diabetes mellitus on insulin drip at this time, on insulin pump at home 4. Hyperlipidemia on crestor 5. Depression on zoloft  Code Status:     Code Status Orders        Start     Ordered   01/28/17 0841  Full code  Continuous     01/28/17 0840    Code Status History    Date Active Date Inactive Code Status Order ID Comments User Context   07/24/2016 10:51 PM 08/04/2016  6:37 PM Full Code [id number]  Theodoro Grist, MD Inpatient   03/11/2016  4:58 AM 03/19/2016  4:44 PM Full Code [id number]  Harrie Foreman, MD Inpatient   01/17/2016  2:50 AM 01/27/2016  5:47 PM Full Code [id number]  Harrie Foreman, MD Inpatient   08/01/2015 11:35 PM 08/15/2015  4:43 PM Full Code [id number]  Lytle Butte, MD ED   07/21/2014 11:46 AM 07/27/2014  6:16 PM Full Code [id number]  Oswald Hillock, MD Inpatient     Disposition Plan: TBD  Consultants:  Critical care specialist  Time spent: 33 minutes  Sergio [name], [employer]

## 2017-01-31 LAB — GLUCOSE, CAPILLARY
Glucose-Capillary: 104 mg/dL — ABNORMAL HIGH (ref 65–99)
Glucose-Capillary: 105 mg/dL — ABNORMAL HIGH (ref 65–99)
Glucose-Capillary: 108 mg/dL — ABNORMAL HIGH (ref 65–99)
Glucose-Capillary: 110 mg/dL — ABNORMAL HIGH (ref 65–99)
Glucose-Capillary: 112 mg/dL — ABNORMAL HIGH (ref 65–99)
Glucose-Capillary: 114 mg/dL — ABNORMAL HIGH (ref 65–99)
Glucose-Capillary: 121 mg/dL — ABNORMAL HIGH (ref 65–99)
Glucose-Capillary: 164 mg/dL — ABNORMAL HIGH (ref 65–99)
Glucose-Capillary: 167 mg/dL — ABNORMAL HIGH (ref 65–99)
Glucose-Capillary: 59 mg/dL — ABNORMAL LOW (ref 65–99)

## 2017-01-31 LAB — CBC
HEMATOCRIT: 35.6 % — AB (ref 40.0–52.0)
Hemoglobin: 12.3 g/dL — ABNORMAL LOW (ref 13.0–18.0)
MCH: 27.6 pg (ref 26.0–34.0)
MCHC: 34.6 g/dL (ref 32.0–36.0)
MCV: 79.7 fL — AB (ref 80.0–100.0)
Platelets: 121 10*3/uL — ABNORMAL LOW (ref 150–440)
RBC: 4.47 MIL/uL (ref 4.40–5.90)
RDW: 16.6 % — AB (ref 11.5–14.5)
WBC: 3.4 10*3/uL — AB (ref 3.8–10.6)

## 2017-01-31 LAB — BASIC METABOLIC PANEL
Anion gap: 7 (ref 5–15)
CALCIUM: 8.2 mg/dL — AB (ref 8.9–10.3)
CO2: 29 mmol/L (ref 22–32)
CREATININE: 0.82 mg/dL (ref 0.61–1.24)
Chloride: 100 mmol/L — ABNORMAL LOW (ref 101–111)
Glucose, Bld: 109 mg/dL — ABNORMAL HIGH (ref 65–99)
Potassium: 3.4 mmol/L — ABNORMAL LOW (ref 3.5–5.1)
SODIUM: 136 mmol/L (ref 135–145)

## 2017-01-31 LAB — TRIGLYCERIDES: TRIGLYCERIDES: 2184 mg/dL — AB (ref <150)

## 2017-01-31 LAB — MRSA PCR SCREENING: MRSA by PCR: NEGATIVE

## 2017-01-31 MED ORDER — CEPHALEXIN 500 MG PO CAPS
500.0000 mg | ORAL_CAPSULE | Freq: Three times a day (TID) | ORAL | Status: DC
  Administered 2017-01-31 – 2017-02-03 (×9): 500 mg via ORAL
  Filled 2017-01-31 (×3): qty 2
  Filled 2017-01-31 (×2): qty 1
  Filled 2017-01-31: qty 2
  Filled 2017-01-31: qty 1
  Filled 2017-01-31 (×2): qty 2

## 2017-01-31 MED ORDER — POTASSIUM CHLORIDE CRYS ER 20 MEQ PO TBCR
40.0000 meq | EXTENDED_RELEASE_TABLET | Freq: Once | ORAL | Status: AC
  Administered 2017-01-31: 40 meq via ORAL
  Filled 2017-01-31: qty 2

## 2017-01-31 MED ORDER — POTASSIUM CHLORIDE CRYS ER 20 MEQ PO TBCR
20.0000 meq | EXTENDED_RELEASE_TABLET | Freq: Once | ORAL | Status: AC
  Administered 2017-01-31: 20 meq via ORAL
  Filled 2017-01-31: qty 1

## 2017-01-31 MED ORDER — SODIUM CHLORIDE 0.9 % IV SOLN
INTRAVENOUS | Status: DC
  Administered 2017-02-01: 15:00:00 via INTRAVENOUS
  Filled 2017-01-31: qty 2.5

## 2017-01-31 NOTE — Progress Notes (Signed)
Patient ID: Sergio [name], male   DOB: 16-Mar-1976, 41 y.o.   MRN: [id number]  [employer] PROGRESS NOTE  Sergio [name]:[id number] DOB: 08-Nov-1975 DOA: 01/28/2017 PCP: Crecencio Mc, MD  HPI/Subjective: Patient'S abd pain is much better tolerating clear liquids . No new complaints  Objective: Vitals:   01/31/17 1500 01/31/17 1600  BP: 122/78 111/75  Pulse: 78 79  Resp: 15 12  Temp:      Filed Weights   01/29/17 0500 01/30/17 0500 01/31/17 0500  Weight: 111.8 kg (246 lb 7.6 oz) 112.7 kg (248 lb 7.3 oz) 116 kg (255 lb 11.7 oz)    ROS: Review of Systems  Constitutional: Negative for chills and fever.  Eyes: Negative for blurred vision.  Respiratory: Negative for cough and shortness of breath.   Cardiovascular: Negative for chest pain.  Gastrointestinal: Positive for abdominal pain. Negative for constipation, diarrhea, nausea and vomiting.  Genitourinary: Negative for dysuria.  Musculoskeletal: Negative for joint pain.  Neurological: Negative for dizziness and headaches.   Exam: Physical Exam  Constitutional: He is oriented to person, place, and time.  HENT:  Nose: No mucosal edema.  Mouth/Throat: No oropharyngeal exudate or posterior oropharyngeal edema.  Eyes: Conjunctivae, EOM and lids are normal. Pupils are equal, round, and reactive to light.  Neck: No JVD present. Carotid bruit is not present. No edema present. No thyroid mass and no thyromegaly present.  Cardiovascular: S1 normal and S2 normal.  Exam reveals no gallop.   No murmur heard. Pulses:      Dorsalis pedis pulses are 2+ on the right side, and 2+ on the left side.  Respiratory: No respiratory distress. He has no wheezes. He has no rhonchi. He has no rales.  GI: Soft. Bowel sounds are normal. There is tenderness in the epigastric area.  Musculoskeletal:       Right ankle: He exhibits no swelling.       Left ankle: He exhibits no swelling.  Lymphadenopathy:    He has no cervical adenopathy.   Neurological: He is alert and oriented to person, place, and time. No cranial nerve deficit.  Skin: Skin is warm. No rash noted. Nails show no clubbing.  Psychiatric: He has a normal mood and affect.      Data Reviewed: Basic Metabolic Panel:  Recent Labs Lab 01/28/17 0918 01/28/17 1543 01/28/17 2245 01/29/17 0426 01/30/17 0418 01/31/17 0444  NA  --  128* 128* 129* 133* 136  K  --  3.7 5.4* 3.6 4.4 3.4*  CL  --  97* 98* 98* 102 100*  CO2  --  24 26 25 22 29   GLUCOSE  --  99 87 127* 137* 109*  BUN  --  10 7 7  <5* <5*  CREATININE  --  0.44* 1.19 0.72 0.87 0.82  CALCIUM  --  8.2* 8.0* 8.1* 8.3* 8.2*  MG 2.7*  --   --   --   --   --    Liver Function Tests:  Recent Labs Lab 01/28/17 0149  AST 34  ALT 25  ALKPHOS 43  BILITOT 0.3  PROT 6.6  ALBUMIN 4.7    Recent Labs Lab 01/28/17 0149 01/29/17 0426  LIPASE 43 32   CBC:  Recent Labs Lab 01/28/17 0149 01/30/17 0418 01/31/17 0444  WBC 5.0 3.7* 3.4*  HGB UNABLE TO REPORT DUE TO LIPEMIC INTERFERENCE 14.0 12.3*  HCT 40.8 38.9* 35.6*  MCV 78.0* 79.8* 79.7*  PLT 177 128* 121*    CBG:  Recent Labs Lab 01/31/17 1011 01/31/17 1313 01/31/17 1340 01/31/17 1442 01/31/17 1614  GLUCAP 104* 59* 167* 164* 121*     Scheduled Meds: . cephALEXin  500 mg Oral TID  . docusate sodium  100 mg Oral BID  . enoxaparin (LOVENOX) injection  40 mg Subcutaneous Q24H  . finasteride  5 mg Oral QHS  . gemfibrozil  600 mg Oral BID AC  . lipase/protease/amylase  72,000 Units Oral TID AC  . omega-3 acid ethyl esters  4 g Oral QHS  . pregabalin  300 mg Oral QHS  . rosuvastatin  10 mg Oral q1800  . sertraline  150 mg Oral Daily  . sodium chloride flush  3 mL Intravenous Q12H  . tamsulosin  0.4 mg Oral QHS  . traZODone  200 mg Oral QHS   Continuous Infusions: . dextrose 10 % (D10) with NaCl and/or heparin NICU IV infusion 150 mL/hr at 01/31/17 1318  . insulin (NOVOLIN-R) infusion 10 Units/hr (01/31/17 1548)     Assessment/Plan:  1. Severe Hypertriglyceridemia. continue Insulin drip until triglycerides less than 1000.Triglycerides greater than 5000--3500-- 2184 today. Clinically improving Patient on crestor, lovaza and gemfibril. Patient sees endocrinology Dr. Gabriel Carina as an outpatient. Pain management as needed. No history of alcoholism according to patient 2. Acute on chronic Pancreatitis. Liquid diet. Creon trial. 3. Type 2 diabetes mellitus on insulin drip at this time, on insulin pump at home 4. Hyperlipidemia on crestor 5. Depression on zoloft  Code Status:     Code Status Orders        Start     Ordered   01/28/17 0841  Full code  Continuous     01/28/17 0840    Code Status History    Date Active Date Inactive Code Status Order ID Comments User Context   07/24/2016 10:51 PM 08/04/2016  6:37 PM Full Code [id number]  Theodoro Grist, MD Inpatient   03/11/2016  4:58 AM 03/19/2016  4:44 PM Full Code [id number]  Harrie Foreman, MD Inpatient   01/17/2016  2:50 AM 01/27/2016  5:47 PM Full Code [id number]  Harrie Foreman, MD Inpatient   08/01/2015 11:35 PM 08/15/2015  4:43 PM Full Code [id number]  Lytle Butte, MD ED   07/21/2014 11:46 AM 07/27/2014  6:16 PM Full Code [id number]  Oswald Hillock, MD Inpatient     Disposition Plan: TBD  Consultants:  Critical care specialist  Time spent: 33 minutes  Lyric Rossano, [employer]

## 2017-01-31 NOTE — Progress Notes (Signed)
[place] NOTE  Pharmacy Consult for Electrolyte Monitoring  Indication: Insulin drip for HTGP   Allergies  Allergen Reactions  . Codeine Anaphylaxis  . Ivp Dye [Iodinated Diagnostic Agents] Other (See Comments)    Kidneys stop working    Patient Measurements: Height: 5\' 11"  (180.3 cm) Weight: 255 lb 11.7 oz (116 kg) IBW/kg (Calculated) : 75.3  Vital Signs: Temp: 98.3 F (36.8 C) (04/23 0400) Temp Source: Oral (04/23 0400) BP: 105/65 (04/23 0400) Pulse Rate: 72 (04/23 0500) Intake/Output from previous day: 04/22 0701 - 04/23 0700 In: 4240 [P.O.:550; I.V.:3690] Out: 4900 [Urine:4900] Intake/Output from this shift: Total I/O In: 1760 [I.V.:1760] Out: 2200 [Urine:2200] Vent settings for last 24 hours:    Labs: Potassium  Date Value Ref Range Status  01/31/2017 3.4 (L) 3.5 - 5.1 mmol/L Final  11/04/2014 3.6 3.5 - 5.1 mmol/L Final   Magnesium  Date Value Ref Range Status  01/28/2017 2.7 (H) 1.7 - 2.4 mg/dL Final  03/18/2014 1.9 mg/dL Final    Comment:    1.8-2.4 THERAPEUTIC RANGE: 4-7 mg/dL TOXIC: > 10 mg/dL  -----------------------       Recent Labs  01/31/17 0046 01/31/17 0208 01/31/17 0339  GLUCAP 110* 105* 108*    Assessment: 41 y/o M with a known h/o familial hypertriglyceridemia and pancreatitis with multiple admissions for the same. Insulin drip ordered; patient was hypokalemic at baseline.   Plan:  4/22 0418,  K = 4.4.  No supplementation ordered. Recheck with am labs.  4/23 AM K+ 3.4. 20 mEq KCl PO x1 ordered. BMP tomorrow AM.  Eloise Harman, [facility] Clinical Pharmacist 01/31/2017,6:41 AM

## 2017-01-31 NOTE — Progress Notes (Signed)
[place] NOTE  Pharmacy Consult for Electrolyte Monitoring  Indication: Insulin drip for HTGP   Allergies  Allergen Reactions  . Codeine Anaphylaxis  . Ivp Dye [Iodinated Diagnostic Agents] Other (See Comments)    Kidneys stop working    Patient Measurements: Height: 5\' 11"  (180.3 cm) Weight: 255 lb 11.7 oz (116 kg) IBW/kg (Calculated) : 75.3  Vital Signs: Temp: 99.7 F (37.6 C) (04/23 1000) Temp Source: Oral (04/23 1000) BP: 117/74 (04/23 1300) Pulse Rate: 83 (04/23 1300) Intake/Output from previous day: 04/22 0701 - 04/23 0700 In: 4400 [P.O.:550; I.V.:3850] Out: 4900 [Urine:4900] Intake/Output from this shift: Total I/O In: 960 [I.V.:960] Out: -  Vent settings for last 24 hours:    Labs: Potassium  Date Value Ref Range Status  01/31/2017 3.4 (L) 3.5 - 5.1 mmol/L Final  11/04/2014 3.6 3.5 - 5.1 mmol/L Final   Magnesium  Date Value Ref Range Status  01/28/2017 2.7 (H) 1.7 - 2.4 mg/dL Final  03/18/2014 1.9 mg/dL Final    Comment:    1.8-2.4 THERAPEUTIC RANGE: 4-7 mg/dL TOXIC: > 10 mg/dL  -----------------------       Recent Labs  01/31/17 1011 01/31/17 1313 01/31/17 1340  GLUCAP 104* 43* 167*    Assessment: 41 y/o M with a known h/o familial hypertriglyceridemia and pancreatitis with multiple admissions for the same. Insulin drip ordered; patient was hypokalemic at baseline.   Plan:  Will give an additional 40 meq K due to continuous insulin in an attempt to prevent hypokalemia. F/U AM labs.   Napoleon Form, [facility] Clinical Pharmacist 01/31/2017,2:37 PM

## 2017-01-31 NOTE — Progress Notes (Signed)
[facility]    Assessment: Severe hypertriglyceridemia; continue to be elevated at >2000 Chronic pancreatitis DM 2 OSA  Plan: --Continue insulin infusion; D5 as needed.  --Continue cpap    Date: 01/31/2017  MRN# [id number] Sergio [name] 1976/10/04   Sergio [name] is a 41 y.o. old male seen in follow up for chief complaint of  Chief Complaint  Patient presents with  . Abdominal Pain     HPI:   No new complaints today, is feeling better, but still not well.   Medication:   Reviewed.   Allergies:  Codeine and Ivp dye [iodinated diagnostic agents]  Review of Systems: Gen:  Denies  fever, sweats. HEENT: Denies blurred vision. Cvc:  No dizziness, chest pain or heaviness Resp:   Denies cough or sputum porduction. Gi: Denies swallowing difficulty, stomach pain. constipation, bowel incontinence Gu:  Denies bladder incontinence, burning urine Ext:   No Joint pain, stiffness. Skin: No skin rash, easy bruising. Endoc:  No polyuria, polydipsia. Psych: No depression, insomnia. Other:  All other systems were reviewed and found to be negative other than what is mentioned in the HPI.   Physical Examination:   VS: BP 117/74   Pulse 83   Temp 99.7 F (37.6 C) (Oral)   Resp 11   Ht 5\' 11"  (1.803 m)   Wt 255 lb 11.7 oz (116 kg)   SpO2 99%   BMI 35.67 kg/m    General Appearance: No distress  Neuro:without focal findings,  speech normal,  HEENT: PERRLA, EOM intact. Pulmonary: normal breath sounds, No wheezing.   CardiovascularNormal S1,S2.  No m/r/g.   Abdomen: Benign, Soft, non-tender. Renal:  No costovertebral tenderness  GU:  Not performed at this time. Endoc: No evident thyromegaly, no signs of acromegaly. Skin:   warm, no rash. Extremities: normal, no cyanosis, clubbing.   LABORATORY PANEL:   CBC  Recent Labs Lab 01/31/17 0444  WBC 3.4*  HGB 12.3*  HCT 35.6*  PLT 121*    ------------------------------------------------------------------------------------------------------------------  Chemistries   Recent Labs Lab 01/28/17 0149 01/28/17 0918  01/31/17 0444  NA 134*  --   < > 136  K 3.3*  --   < > 3.4*  CL 92*  --   < > 100*  CO2 23  --   < > 29  GLUCOSE 160*  --   < > 109*  BUN 10  --   < > <5*  CREATININE 0.59*  --   < > 0.82  CALCIUM 9.1  --   < > 8.2*  MG  --  2.7*  --   --   AST 34  --   --   --   ALT 25  --   --   --   ALKPHOS 43  --   --   --   BILITOT 0.3  --   --   --   < > = values in this interval not displayed. ------------------------------------------------------------------------------------------------------------------  Cardiac Enzymes No results for input(s): TROPONINI in the last 168 hours. ------------------------------------------------------------  RADIOLOGY:   No results found for this or any previous visit. Results for orders placed in visit on 10/01/14  DG Chest 2 View   Narrative * PRIOR REPORT IMPORTED FROM AN EXTERNAL SYSTEM *   CLINICAL DATA:  Bariatric surgery clearance for morbid obesity.   EXAM:  CHEST  2 VIEW   COMPARISON:  02/17/2013   FINDINGS:  Low lung volumes accentuate cardiac size and bronchovascular  markings. Accounting for this, The heart size and mediastinal  contours are within normal limits. Both lungs are clear. The  visualized skeletal structures are unremarkable.   IMPRESSION:  No active cardiopulmonary disease.  Similar appearance priors.    Electronically Signed    By: Rolla Flatten M.D.    On: 10/02/2014 08:22       ------------------------------------------------------------------------------------------------------------------  Thank  you for allowing [facility] to assist in the care of your patient. Our recommendations are noted above.  Please contact us if we can be of further service.   Marda Stalker, MD.  [facility] Office Number: [phone]  Patricia Pesa, M.D.  Merton Border, M.D  01/31/2017

## 2017-02-01 LAB — GLUCOSE, CAPILLARY
Glucose-Capillary: 112 mg/dL — ABNORMAL HIGH (ref 65–99)
Glucose-Capillary: 113 mg/dL — ABNORMAL HIGH (ref 65–99)
Glucose-Capillary: 119 mg/dL — ABNORMAL HIGH (ref 65–99)
Glucose-Capillary: 122 mg/dL — ABNORMAL HIGH (ref 65–99)
Glucose-Capillary: 124 mg/dL — ABNORMAL HIGH (ref 65–99)
Glucose-Capillary: 126 mg/dL — ABNORMAL HIGH (ref 65–99)
Glucose-Capillary: 134 mg/dL — ABNORMAL HIGH (ref 65–99)
Glucose-Capillary: 137 mg/dL — ABNORMAL HIGH (ref 65–99)
Glucose-Capillary: 139 mg/dL — ABNORMAL HIGH (ref 65–99)
Glucose-Capillary: 162 mg/dL — ABNORMAL HIGH (ref 65–99)
Glucose-Capillary: 190 mg/dL — ABNORMAL HIGH (ref 65–99)
Glucose-Capillary: 74 mg/dL (ref 65–99)
Glucose-Capillary: 84 mg/dL (ref 65–99)
Glucose-Capillary: 88 mg/dL (ref 65–99)

## 2017-02-01 LAB — BASIC METABOLIC PANEL
Anion gap: 8 (ref 5–15)
BUN: <5 mg/dL — ABNORMAL LOW (ref 6–20)
CO2: 28 mmol/L (ref 22–32)
Calcium: 8.5 mg/dL — ABNORMAL LOW (ref 8.9–10.3)
Chloride: 102 mmol/L (ref 101–111)
Creatinine, Ser: 0.81 mg/dL (ref 0.61–1.24)
GLUCOSE: 125 mg/dL — AB (ref 65–99)
POTASSIUM: 3.6 mmol/L (ref 3.5–5.1)
Sodium: 138 mmol/L (ref 135–145)

## 2017-02-01 LAB — CBC
HCT: 36.2 % — ABNORMAL LOW (ref 40.0–52.0)
HEMOGLOBIN: 12.3 g/dL — AB (ref 13.0–18.0)
MCH: 27 pg (ref 26.0–34.0)
MCHC: 34 g/dL (ref 32.0–36.0)
MCV: 79.5 fL — ABNORMAL LOW (ref 80.0–100.0)
Platelets: 116 10*3/uL — ABNORMAL LOW (ref 150–440)
RBC: 4.56 MIL/uL (ref 4.40–5.90)
RDW: 16.3 % — ABNORMAL HIGH (ref 11.5–14.5)
WBC: 3 10*3/uL — ABNORMAL LOW (ref 3.8–10.6)

## 2017-02-01 LAB — TRIGLYCERIDES: TRIGLYCERIDES: 1411 mg/dL — AB (ref <150)

## 2017-02-01 MED ORDER — SODIUM CHLORIDE 4 MEQ/ML IV SOLN
INTRAVENOUS | Status: DC
  Administered 2017-02-01 – 2017-02-02 (×7): via INTRAVENOUS
  Filled 2017-02-01 (×8): qty 500

## 2017-02-01 MED ORDER — POTASSIUM CHLORIDE CRYS ER 20 MEQ PO TBCR
40.0000 meq | EXTENDED_RELEASE_TABLET | Freq: Once | ORAL | Status: AC
  Administered 2017-02-01: 40 meq via ORAL
  Filled 2017-02-01: qty 2

## 2017-02-01 NOTE — Progress Notes (Signed)
[place] NOTE  Pharmacy Consult for Electrolyte Monitoring  Indication: Insulin drip for HTGP   Allergies  Allergen Reactions  . Codeine Anaphylaxis  . Ivp Dye [Iodinated Diagnostic Agents] Other (See Comments)    Kidneys stop working    Patient Measurements: Height: 5\' 11"  (180.3 cm) Weight: 244 lb 4.3 oz (110.8 kg) IBW/kg (Calculated) : 75.3  Vital Signs: Temp: 98.4 F (36.9 C) (04/24 0800) Temp Source: Axillary (04/24 0231) BP: 104/76 (04/24 1200) Pulse Rate: 68 (04/24 1200) Intake/Output from previous day: 04/23 0701 - 04/24 0700 In: 3722.5 [I.V.:3722.5] Out: 5093 [Urine:5625] Intake/Output from this shift: Total I/O In: 50 [I.V.:50] Out: -  Vent settings for last 24 hours:    Labs: Potassium  Date Value Ref Range Status  02/01/2017 3.6 3.5 - 5.1 mmol/L Final  11/04/2014 3.6 3.5 - 5.1 mmol/L Final   Magnesium  Date Value Ref Range Status  01/28/2017 2.7 (H) 1.7 - 2.4 mg/dL Final  03/18/2014 1.9 mg/dL Final    Comment:    1.8-2.4 THERAPEUTIC RANGE: 4-7 mg/dL TOXIC: > 10 mg/dL  -----------------------       Recent Labs  02/01/17 1002 02/01/17 1049 02/01/17 1222  GLUCAP 74 84 190*    Assessment: 41 y/o M with a known h/o familial hypertriglyceridemia and pancreatitis with multiple admissions for the same. Insulin drip ordered; patient was hypokalemic at baseline.   Plan:  Will give 40 meq oral KCl once today and f/u AM labs.   Napoleon Form, [facility] Clinical Pharmacist 02/01/2017,1:11 PM

## 2017-02-01 NOTE — Progress Notes (Signed)
Patient ID: Sergio [name], male   DOB: 1976/08/05, 41 y.o.   MRN: [id number]  [employer] PROGRESS NOTE  Sergio [name]:[id number] DOB: 10-17-1975 DOA: 01/28/2017 PCP: Crecencio Mc, MD  HPI/Subjective: Patient'S no new complaints  Objective: Vitals:   02/01/17 1400 02/01/17 1500  BP: (!) 135/94 (!) 126/95  Pulse: 78 80  Resp: 15 13  Temp:      Filed Weights   01/30/17 0500 01/31/17 0500 02/01/17 0500  Weight: 112.7 kg (248 lb 7.3 oz) 116 kg (255 lb 11.7 oz) 110.8 kg (244 lb 4.3 oz)    ROS: Review of Systems  Constitutional: Negative for chills and fever.  Eyes: Negative for blurred vision.  Respiratory: Negative for cough and shortness of breath.   Cardiovascular: Negative for chest pain.  Gastrointestinal: Positive for abdominal pain. Negative for constipation, diarrhea, nausea and vomiting.  Genitourinary: Negative for dysuria.  Musculoskeletal: Negative for joint pain.  Neurological: Negative for dizziness and headaches.   Exam: Physical Exam  Constitutional: He is oriented to person, place, and time.  HENT:  Nose: No mucosal edema.  Mouth/Throat: No oropharyngeal exudate or posterior oropharyngeal edema.  Eyes: Conjunctivae, EOM and lids are normal. Pupils are equal, round, and reactive to light.  Neck: No JVD present. Carotid bruit is not present. No edema present. No thyroid mass and no thyromegaly present.  Cardiovascular: S1 normal and S2 normal.  Exam reveals no gallop.   No murmur heard. Pulses:      Dorsalis pedis pulses are 2+ on the right side, and 2+ on the left side.  Respiratory: No respiratory distress. He has no wheezes. He has no rhonchi. He has no rales.  GI: Soft. Bowel sounds are normal. There is tenderness in the epigastric area.  Musculoskeletal:       Right ankle: He exhibits no swelling.       Left ankle: He exhibits no swelling.  Lymphadenopathy:    He has no cervical adenopathy.  Neurological: He is alert and oriented to  person, place, and time. No cranial nerve deficit.  Skin: Skin is warm. No rash noted. Nails show no clubbing.  Psychiatric: He has a normal mood and affect.      Data Reviewed: Basic Metabolic Panel:  Recent Labs Lab 01/28/17 0918  01/28/17 2245 01/29/17 0426 01/30/17 0418 01/31/17 0444 02/01/17 0412  NA  --   < > 128* 129* 133* 136 138  K  --   < > 5.4* 3.6 4.4 3.4* 3.6  CL  --   < > 98* 98* 102 100* 102  CO2  --   < > 26 25 22 29 28   GLUCOSE  --   < > 87 127* 137* 109* 125*  BUN  --   < > 7 7 <5* <5* <5*  CREATININE  --   < > 1.19 0.72 0.87 0.82 0.81  CALCIUM  --   < > 8.0* 8.1* 8.3* 8.2* 8.5*  MG 2.7*  --   --   --   --   --   --   < > = values in this interval not displayed. Liver Function Tests:  Recent Labs Lab 01/28/17 0149  AST 34  ALT 25  ALKPHOS 43  BILITOT 0.3  PROT 6.6  ALBUMIN 4.7    Recent Labs Lab 01/28/17 0149 01/29/17 0426  LIPASE 43 32   CBC:  Recent Labs Lab 01/28/17 0149 01/30/17 0418 01/31/17 0444 02/01/17 0412  WBC 5.0 3.7*  3.4* 3.0*  HGB UNABLE TO REPORT DUE TO LIPEMIC INTERFERENCE 14.0 12.3* 12.3*  HCT 40.8 38.9* 35.6* 36.2*  MCV 78.0* 79.8* 79.7* 79.5*  PLT 177 128* 121* 116*    CBG:  Recent Labs Lab 02/01/17 1002 02/01/17 1049 02/01/17 1222 02/01/17 1415 02/01/17 1610  GLUCAP 74 84 190* 162* 124*     Scheduled Meds: . cephALEXin  500 mg Oral TID  . docusate sodium  100 mg Oral BID  . enoxaparin (LOVENOX) injection  40 mg Subcutaneous Q24H  . finasteride  5 mg Oral QHS  . gemfibrozil  600 mg Oral BID AC  . lipase/protease/amylase  72,000 Units Oral TID AC  . omega-3 acid ethyl esters  4 g Oral QHS  . pregabalin  300 mg Oral QHS  . rosuvastatin  10 mg Oral q1800  . sertraline  150 mg Oral Daily  . sodium chloride flush  3 mL Intravenous Q12H  . tamsulosin  0.4 mg Oral QHS  . traZODone  200 mg Oral QHS   Continuous Infusions: . dextrose 10 % (D10) with NaCl and/or heparin NICU IV infusion 150 mL/hr at  02/01/17 1448  . insulin (NOVOLIN-R) infusion 10 Units/hr (02/01/17 0200)    Assessment/Plan:  1. Severe Hypertriglyceridemia. continue Insulin drip until triglycerides less than 1000.Triglycerides greater than 5000--3500-- 2184--1411 today. Clinically improving Patient on crestor, lovaza and gemfibril. Patient sees endocrinology Dr. Gabriel Carina as an outpatient. Pain management as needed. No history of alcoholism according to patient 2. Acute on chronic Pancreatitis. Liquid diet. Creon trial. 3. Type 2 diabetes mellitus on insulin drip at this time, on insulin pump at home 4. Hyperlipidemia on crestor 5. Depression on zoloft  Code Status:     Code Status Orders        Start     Ordered   01/28/17 0841  Full code  Continuous     01/28/17 0840    Code Status History    Date Active Date Inactive Code Status Order ID Comments User Context   07/24/2016 10:51 PM 08/04/2016  6:37 PM Full Code [id number]  Theodoro Grist, MD Inpatient   03/11/2016  4:58 AM 03/19/2016  4:44 PM Full Code [id number]  Harrie Foreman, MD Inpatient   01/17/2016  2:50 AM 01/27/2016  5:47 PM Full Code [id number]  Harrie Foreman, MD Inpatient   08/01/2015 11:35 PM 08/15/2015  4:43 PM Full Code [id number]  Lytle Butte, MD ED   07/21/2014 11:46 AM 07/27/2014  6:16 PM Full Code [id number]  Oswald Hillock, MD Inpatient     Disposition Plan: TBD  Consultants:  Critical care specialist  Time spent: 33 minutes  Moxon Messler, [employer]

## 2017-02-01 NOTE — Progress Notes (Signed)
* [facility]    Assessment: Severe hypertriglyceridemia; continue to be elevated at but now below 1500.  Chronic pancreatitis. DM 2 OSA  Plan: --Continue insulin infusion; dextrose --Continue cpap qhs   Date: 02/01/2017  MRN# [id number] Sergio [name] [birth year]   Sergio [name] is a 41 y.o. old male seen in follow up for chief complaint of  Chief Complaint  Patient presents with  . Abdominal Pain     HPI:   No new complaints today, is feeling better, but  Again still not well.   Medication:   Reviewed.   Allergies:  Codeine and Ivp dye [iodinated diagnostic agents]  Review of Systems: Gen:  Denies  fever, sweats. HEENT: Denies blurred vision. Cvc:  No dizziness, chest pain or heaviness Resp:   Denies cough or sputum porduction. Gi: Denies swallowing difficulty, stomach pain. constipation, bowel incontinence Gu:  Denies bladder incontinence, burning urine Ext:   No Joint pain, stiffness. Skin: No skin rash, easy bruising. Endoc:  No polyuria, polydipsia. Psych: No depression, insomnia. Other:  All other systems were reviewed and found to be negative other than what is mentioned in the HPI.   Physical Examination:   VS: BP (!) 135/94   Pulse 78   Temp 98.4 F (36.9 C)   Resp 15   Ht 5\' 11"  (1.803 m)   Wt 244 lb 4.3 oz (110.8 kg)   SpO2 94%   BMI 34.07 kg/m    General Appearance: No distress  Neuro:without focal findings,  speech normal,  HEENT: PERRLA, EOM intact. Pulmonary: normal breath sounds, No wheezing.   CardiovascularNormal S1,S2.  No m/r/g.   Abdomen: Benign, Soft, non-tender. Renal:  No costovertebral tenderness  GU:  Not performed at this time. Endoc: No evident thyromegaly, no signs of acromegaly. Skin:   warm, no rash. Extremities: normal, no cyanosis, clubbing.   LABORATORY PANEL:   CBC  Recent Labs Lab 02/01/17 0412  WBC 3.0*  HGB 12.3*  HCT 36.2*  PLT 116*    ------------------------------------------------------------------------------------------------------------------  Chemistries   Recent Labs Lab 01/28/17 0149 01/28/17 0918  02/01/17 0412  NA 134*  --   < > 138  K 3.3*  --   < > 3.6  CL 92*  --   < > 102  CO2 23  --   < > 28  GLUCOSE 160*  --   < > 125*  BUN 10  --   < > <5*  CREATININE 0.59*  --   < > 0.81  CALCIUM 9.1  --   < > 8.5*  MG  --  2.7*  --   --   AST 34  --   --   --   ALT 25  --   --   --   ALKPHOS 43  --   --   --   BILITOT 0.3  --   --   --   < > = values in this interval not displayed. ------------------------------------------------------------------------------------------------------------------  Cardiac Enzymes No results for input(s): TROPONINI in the last 168 hours. ------------------------------------------------------------  RADIOLOGY:   No results found for this or any previous visit. Results for orders placed in visit on 10/01/14  DG Chest 2 View   Narrative * PRIOR REPORT IMPORTED FROM AN EXTERNAL SYSTEM *   CLINICAL DATA:  Bariatric surgery clearance for morbid obesity.   EXAM:  CHEST  2 VIEW   COMPARISON:  02/17/2013   FINDINGS:  Low lung volumes accentuate cardiac  size and bronchovascular  markings. Accounting for this, The heart size and mediastinal  contours are within normal limits. Both lungs are clear. The  visualized skeletal structures are unremarkable.   IMPRESSION:  No active cardiopulmonary disease.  Similar appearance priors.    Electronically Signed    By: Rolla Flatten M.D.    On: 10/02/2014 08:22       ------------------------------------------------------------------------------------------------------------------  Thank  you for allowing [facility] to assist in the care of your patient. Our recommendations are noted above.  Please contact us if we can be of further service.   Marda Stalker, MD.  [facility] Office Number: [phone]  Patricia Pesa, M.D.  Merton Border, M.D  02/01/2017

## 2017-02-01 NOTE — Progress Notes (Signed)
More hopeful. Triglycerides decreasing rapidly. Output good.[name]

## 2017-02-02 LAB — GLUCOSE, CAPILLARY
Glucose-Capillary: 105 mg/dL — ABNORMAL HIGH (ref 65–99)
Glucose-Capillary: 108 mg/dL — ABNORMAL HIGH (ref 65–99)
Glucose-Capillary: 110 mg/dL — ABNORMAL HIGH (ref 65–99)
Glucose-Capillary: 91 mg/dL (ref 65–99)
Glucose-Capillary: 121 mg/dL — ABNORMAL HIGH (ref 65–99)
Glucose-Capillary: 249 mg/dL — ABNORMAL HIGH (ref 65–99)
Glucose-Capillary: 226 mg/dL — ABNORMAL HIGH (ref 65–99)

## 2017-02-02 LAB — POTASSIUM: POTASSIUM: 4.2 mmol/L (ref 3.5–5.1)

## 2017-02-02 LAB — BASIC METABOLIC PANEL
Anion gap: 6 (ref 5–15)
CHLORIDE: 103 mmol/L (ref 101–111)
CO2: 30 mmol/L (ref 22–32)
Calcium: 8.2 mg/dL — ABNORMAL LOW (ref 8.9–10.3)
Creatinine, Ser: 0.93 mg/dL (ref 0.61–1.24)
GFR calc Af Amer: >60 mL/min (ref >60)
GFR calc non Af Amer: >60 mL/min (ref >60)
GLUCOSE: 113 mg/dL — AB (ref 65–99)
POTASSIUM: 3.2 mmol/L — AB (ref 3.5–5.1)
Sodium: 139 mmol/L (ref 135–145)

## 2017-02-02 LAB — MAGNESIUM: Magnesium: 1.5 mg/dL — ABNORMAL LOW (ref 1.7–2.4)

## 2017-02-02 LAB — TRIGLYCERIDES: Triglycerides: 1046 mg/dL — ABNORMAL HIGH (ref <150)

## 2017-02-02 MED ORDER — INSULIN ASPART 100 UNIT/ML ~~LOC~~ SOLN
0.0000 [IU] | Freq: Every day | SUBCUTANEOUS | Status: DC
  Administered 2017-02-02: 22:00:00 2 [IU] via SUBCUTANEOUS
  Filled 2017-02-02: qty 2

## 2017-02-02 MED ORDER — MAGNESIUM SULFATE 4 GM/100ML IV SOLN
4.0000 g | Freq: Once | INTRAVENOUS | Status: AC
  Administered 2017-02-02: 4 g via INTRAVENOUS
  Filled 2017-02-02: qty 100

## 2017-02-02 MED ORDER — MAGNESIUM OXIDE 400 (241.3 MG) MG PO TABS
400.0000 mg | ORAL_TABLET | Freq: Two times a day (BID) | ORAL | Status: DC
  Administered 2017-02-02 – 2017-02-03 (×3): 400 mg via ORAL
  Filled 2017-02-02 (×3): qty 1

## 2017-02-02 MED ORDER — FAMOTIDINE 20 MG PO TABS
20.0000 mg | ORAL_TABLET | Freq: Every day | ORAL | Status: DC
  Administered 2017-02-02 – 2017-02-03 (×2): 20 mg via ORAL
  Filled 2017-02-02 (×2): qty 1

## 2017-02-02 MED ORDER — POTASSIUM CHLORIDE CRYS ER 20 MEQ PO TBCR
40.0000 meq | EXTENDED_RELEASE_TABLET | ORAL | Status: AC
  Administered 2017-02-02 (×2): 40 meq via ORAL
  Filled 2017-02-02 (×2): qty 2

## 2017-02-02 MED ORDER — INSULIN ASPART 100 UNIT/ML ~~LOC~~ SOLN
0.0000 [IU] | Freq: Three times a day (TID) | SUBCUTANEOUS | Status: DC
  Administered 2017-02-02: 17:00:00 3 [IU] via SUBCUTANEOUS
  Administered 2017-02-03 (×2): 4 [IU] via SUBCUTANEOUS
  Filled 2017-02-02 (×2): qty 4
  Filled 2017-02-02: qty 3

## 2017-02-02 NOTE — Progress Notes (Signed)
[facility] NOTE  Pharmacy Consult for Electrolyte Monitoring  Indication: Insulin drip for HTGP   Allergies  Allergen Reactions  . Codeine Anaphylaxis  . Ivp Dye [Iodinated Diagnostic Agents] Other (See Comments)    Kidneys stop working    Patient Measurements: Height: 5\' 11"  (180.3 cm) Weight: 244 lb 7.8 oz (110.9 kg) IBW/kg (Calculated) : 75.3  Vital Signs: Temp: 98.7 F (37.1 C) (04/25 0800) Temp Source: Oral (04/25 0500) BP: 114/82 (04/25 1000) Pulse Rate: 64 (04/25 1000) Intake/Output from previous day: 04/24 0701 - 04/25 0700 In: 2750 [P.O.:1250; I.V.:1500] Out: 4000 [Urine:4000] Intake/Output from this shift: Total I/O In: -  Out: 250 [Urine:250] Vent settings for last 24 hours:    Labs: Potassium  Date Value Ref Range Status  02/02/2017 3.2 (L) 3.5 - 5.1 mmol/L Final  11/04/2014 3.6 3.5 - 5.1 mmol/L Final   Magnesium  Date Value Ref Range Status  02/02/2017 1.5 (L) 1.7 - 2.4 mg/dL Final  03/18/2014 1.9 mg/dL Final    Comment:    1.8-2.4 THERAPEUTIC RANGE: 4-7 mg/dL TOXIC: > 10 mg/dL  -----------------------       Recent Labs  02/02/17 0355 02/02/17 0602 02/02/17 1150  GLUCAP 108* 110* 91    Assessment: 41 y/o M with a known h/o familial hypertriglyceridemia and pancreatitis with multiple admissions for the same. Insulin drip ordered; patient was hypokalemic at baseline.   Plan:  KCl 40 meq po x 2 and magnesium sulfate 4 g iv once. Will recheck K at 1800.   Napoleon Form, [facility] Clinical Pharmacist 02/02/2017,1:57 PM

## 2017-02-02 NOTE — Care Management (Signed)
Follow up appointment with Dr. Gabriel Carina placed in discharge and should show up on AVS at time of discharge.

## 2017-02-02 NOTE — Plan of Care (Signed)
Problem: Pain Managment: Goal: General experience of comfort will improve Outcome: Progressing Pt pain managed with pain meds.

## 2017-02-02 NOTE — Progress Notes (Signed)
Patient ID: Sergio [name], male   DOB: [birth year], 41 y.o.   MRN: [id number]  [employer] PROGRESS NOTE  Sergio [name] UUV:[id number] DOB: March 23, 1976 DOA: 01/28/2017 PCP: Sergio Mc, MD  HPI/Subjective: Patient'S no new complaints, tolerating diet, insulin drip discontinued as triglycerides improved. Last bowel movement today  Objective: Vitals:   02/02/17 1200 02/02/17 1442  BP:  132/86  Pulse:  77  Resp:  18  Temp: 98.6 F (37 C) 97.9 F (36.6 C)    Filed Weights   02/01/17 0500 02/02/17 0500 02/02/17 1442  Weight: 110.8 kg (244 lb 4.3 oz) 110.9 kg (244 lb 7.8 oz) 114.4 kg (252 lb 1.6 oz)    ROS: Review of Systems  Constitutional: Negative for chills and fever.  Eyes: Negative for blurred vision.  Respiratory: Negative for cough and shortness of breath.   Cardiovascular: Negative for chest pain.  Gastrointestinal: Positive for abdominal pain. Negative for constipation, diarrhea, nausea and vomiting.  Genitourinary: Negative for dysuria.  Musculoskeletal: Negative for joint pain.  Neurological: Negative for dizziness and headaches.   Exam: Physical Exam  Constitutional: He is oriented to person, place, and time.  HENT:  Nose: No mucosal edema.  Mouth/Throat: No oropharyngeal exudate or posterior oropharyngeal edema.  Eyes: Conjunctivae, EOM and lids are normal. Pupils are equal, round, and reactive to light.  Neck: No JVD present. Carotid bruit is not present. No edema present. No thyroid mass and no thyromegaly present.  Cardiovascular: S1 normal and S2 normal.  Exam reveals no gallop.   No murmur heard. Pulses:      Dorsalis pedis pulses are 2+ on the right side, and 2+ on the left side.  Respiratory: No respiratory distress. He has no wheezes. He has no rhonchi. He has no rales.  GI: Soft. Bowel sounds are normal. There is no tenderness.  Musculoskeletal:       Right ankle: He exhibits no swelling.       Left ankle: He exhibits no swelling.   Lymphadenopathy:    He has no cervical adenopathy.  Neurological: He is alert and oriented to person, place, and time. No cranial nerve deficit.  Skin: Skin is warm. No rash noted. Nails show no clubbing.  Psychiatric: He has a normal mood and affect.      Data Reviewed: Basic Metabolic Panel:  Recent Labs Lab 01/28/17 0918  01/29/17 0426 01/30/17 0418 01/31/17 0444 02/01/17 0412 02/02/17 0343  NA  --   < > 129* 133* 136 138 139  K  --   < > 3.6 4.4 3.4* 3.6 3.2*  CL  --   < > 98* 102 100* 102 103  CO2  --   < > 25 22 29 28 30   GLUCOSE  --   < > 127* 137* 109* 125* 113*  BUN  --   < > 7 <5* <5* <5* <5*  CREATININE  --   < > 0.72 0.87 0.82 0.81 0.93  CALCIUM  --   < > 8.1* 8.3* 8.2* 8.5* 8.2*  MG 2.7*  --   --   --   --   --  1.5*  < > = values in this interval not displayed. Liver Function Tests:  Recent Labs Lab 01/28/17 0149  AST 34  ALT 25  ALKPHOS 43  BILITOT 0.3  PROT 6.6  ALBUMIN 4.7    Recent Labs Lab 01/28/17 0149 01/29/17 0426  LIPASE 43 32   CBC:  Recent Labs Lab 01/28/17  0149 01/30/17 0418 01/31/17 0444 02/01/17 0412  WBC 5.0 3.7* 3.4* 3.0*  HGB UNABLE TO REPORT DUE TO LIPEMIC INTERFERENCE 14.0 12.3* 12.3*  HCT 40.8 38.9* 35.6* 36.2*  MCV 78.0* 79.8* 79.7* 79.5*  PLT 177 128* 121* 116*    CBG:  Recent Labs Lab 02/02/17 0211 02/02/17 0355 02/02/17 0602 02/02/17 1150 02/02/17 1649  GLUCAP 105* 108* 110* 91 121*     Scheduled Meds: . cephALEXin  500 mg Oral TID  . docusate sodium  100 mg Oral BID  . enoxaparin (LOVENOX) injection  40 mg Subcutaneous Q24H  . famotidine  20 mg Oral Daily  . finasteride  5 mg Oral QHS  . gemfibrozil  600 mg Oral BID AC  . insulin aspart  0-20 Units Subcutaneous TID WC  . insulin aspart  0-5 Units Subcutaneous QHS  . lipase/protease/amylase  72,000 Units Oral TID AC  . magnesium oxide  400 mg Oral BID  . omega-3 acid ethyl esters  4 g Oral QHS  . pregabalin  300 mg Oral QHS  .  rosuvastatin  10 mg Oral q1800  . sertraline  150 mg Oral Daily  . sodium chloride flush  3 mL Intravenous Q12H  . tamsulosin  0.4 mg Oral QHS  . traZODone  200 mg Oral QHS   Continuous Infusions:   Assessment/Plan:  1. Severe Hypertriglyceridemia. Discontinue Insulin drip .patient is started on insulin sliding scale which can be discontinued at the time of discharge patient can't resume insulin pump after he goes home. Triglycerides greater than 5000--3500-- 2184--1411 --1046 today. Clinically improving Patient on crestor, lovaza and gemfibril. Patient sees endocrinology Sergio [name] as an outpatient. Pain management as needed. No history of alcoholism according to patient 2. Acute on chronic Pancreatitis. Diet advanced to soft. Creon  3. Type 2 diabetes mellitus sliding scale insulin, on insulin pump at home 4. Hyperlipidemia on crestor 5. Depression on zoloft  Physical therapy evaluation anticipated discharge in a.m.  Code Status:     Code Status Orders        Start     Ordered   01/28/17 0841  Full code  Continuous     01/28/17 0840    Code Status History    Date Active Date Inactive Code Status Order ID Comments User Context   07/24/2016 10:51 PM 08/04/2016  6:37 PM Full Code [id number]  Theodoro Grist, MD Inpatient   03/11/2016  4:58 AM 03/19/2016  4:44 PM Full Code [id number]  Harrie Foreman, MD Inpatient   01/17/2016  2:50 AM 01/27/2016  5:47 PM Full Code [id number]  Harrie Foreman, MD Inpatient   08/01/2015 11:35 PM 08/15/2015  4:43 PM Full Code [id number]  Lytle Butte, MD ED   07/21/2014 11:46 AM 07/27/2014  6:16 PM Full Code [id number]  Oswald Hillock, MD Inpatient     Disposition Plan: TBD  Consultants:  Critical care specialist  Time spent: 33 minutes  Sergio [name], [employer]

## 2017-02-02 NOTE — Progress Notes (Signed)
1430 Report called to [facility] on 1C. 1445 Patient taken to 112 via wheelchair.

## 2017-02-02 NOTE — Progress Notes (Addendum)
[facility]    Assessment: Severe hypertriglyceridemia; continue to be elevated at but now below 1500-improving Chronic pancreatitis DM 2 OSA Diarrhea   Plan: Continue CPAP qhs Maintain O2 sats >92% Will transition pt off of Insulin and D10W gtt triglycerides 1046-stopped 04/25 Initiate SSI ac/hs pt uses an insulin pump, however he states his insulin pump is at home and is agreeable with SSI for CBG coverage Continue current diet Follow hypoglycemic protocol  Will start po pepcid Continue creon  Continue prn imodium  Continue prn hydromorphone and oxycodone  Will need to follow-up with endocrinologist Dr. Gabriel Carina post discharge   -Will transfer pt to [facility] today 02/02/17 once bed is available PCCM team will sign off.  Pt seen and examined with NP, agree with assessment and plan. Pt notes he is feeling the same, maybe better. No new complaints today, the patient's triglyceride level continued to decrease appropriately. He can therefore be taken off of the insulin drip, and triglycerides will be monitored. Marda Stalker, M.D.   Date: 02/02/2017  MRN# [id number] TIGHE GITTO 10/08/1976   TRIPTON NED is a 41 y.o. old male seen in follow up for chief complaint of  Chief Complaint  Patient presents with  . Abdominal Pain    HPI:  Pt mainly complaining of mild epigastric pain no other complaints at this time.  Medication:   Reviewed.   Allergies:  Codeine and Ivp dye [iodinated diagnostic agents]  Review of Systems: Positives in BOLD  Gen: Denies fever, chills, weight change, fatigue, night sweats HEENT: Denies blurred vision, double vision, hearing loss, tinnitus, sinus congestion, rhinorrhea, sore throat, neck stiffness, dysphagia PULM: Denies shortness of breath, cough, sputum production, hemoptysis, wheezing CV: Denies chest pain, edema, orthopnea, paroxysmal nocturnal dyspnea, palpitations GI: epigastric pain, nausea, vomiting,  diarrhea, hematochezia, melena, constipation, change in bowel habits GU: Denies dysuria, hematuria, polyuria, oliguria, urethral discharge Endocrine: Denies hot or cold intolerance, polyuria, polyphagia or appetite change Derm: Denies rash, dry skin, scaling or peeling skin change Heme: Denies easy bruising, bleeding, bleeding gums Neuro: Denies headache, numbness, weakness, slurred speech, loss of memory or consciousness  Physical Examination:   VS: BP 114/82   Pulse 64   Temp 98.7 F (37.1 C)   Resp 11   Ht 5\' 11"  (1.803 m)   Wt 110.9 kg (244 lb 7.8 oz)   SpO2 97%   BMI 34.10 kg/m    General Appearance: obese Caucasian male, NAD Neuro: alert and oriented, follows commands  HEENT: PERRLA, EOM intact Pulmonary: normal breath sounds, no wheezing Cardiovascular: nsr, s1s2, no M/R/G Abdomen: Benign, soft, non-tender, non distended, obese Skin:   warm, no rash Extremities: normal, no cyanosis, no edema    LABORATORY PANEL:   CBC  Recent Labs Lab 02/01/17 0412  WBC 3.0*  HGB 12.3*  HCT 36.2*  PLT 116*   ------------------------------------------------------------------------------------------------------------------  Chemistries   Recent Labs Lab 01/28/17 0149  02/02/17 0343  NA 134*  < > 139  K 3.3*  < > 3.2*  CL 92*  < > 103  CO2 23  < > 30  GLUCOSE 160*  < > 113*  BUN 10  < > <5*  CREATININE 0.59*  < > 0.93  CALCIUM 9.1  < > 8.2*  MG  --   < > 1.5*  AST 34  --   --   ALT 25  --   --   ALKPHOS 43  --   --  BILITOT 0.3  --   --   < > = values in this interval not displayed. ------------------------------------------------------------------------------------------------------------------  Cardiac Enzymes No results for input(s): TROPONINI in the last 168 hours. ------------------------------------------------------------  RADIOLOGY:   No results found for this or any previous visit. Results for orders placed in visit on 10/01/14  DG Chest 2 View     Narrative * PRIOR REPORT IMPORTED FROM AN EXTERNAL SYSTEM *   CLINICAL DATA:  Bariatric surgery clearance for morbid obesity.   EXAM:  CHEST  2 VIEW   COMPARISON:  02/17/2013   FINDINGS:  Low lung volumes accentuate cardiac size and bronchovascular  markings. Accounting for this, The heart size and mediastinal  contours are within normal limits. Both lungs are clear. The  visualized skeletal structures are unremarkable.   IMPRESSION:  No active cardiopulmonary disease.  Similar appearance priors.    Electronically Signed    By: Rolla Flatten M.D.    On: 10/02/2014 08:22       ------------------------------------------------------------------------------------------------------------------  Thank  you for allowing [facility], [facility] to assist in the care of your patient. Our recommendations are noted above.  Please contact us if we can be of further service.   Marda Stalker, [place] Pager [phone] (please enter 7 digits) PCCM Consult Pager [phone] (please enter 7 digits)

## 2017-02-02 NOTE — Progress Notes (Signed)
[place] NOTE  Pharmacy Consult for Electrolyte Monitoring  Indication: Insulin drip for HTGP   Allergies  Allergen Reactions  . Codeine Anaphylaxis  . Ivp Dye [Iodinated Diagnostic Agents] Other (See Comments)    Kidneys stop working    Patient Measurements: Height: 5\' 11"  (180.3 cm) Weight: 252 lb 1.6 oz (114.4 kg) IBW/kg (Calculated) : 75.3  Vital Signs: Temp: 97.9 F (36.6 C) (04/25 1442) Temp Source: Oral (04/25 1442) BP: 132/86 (04/25 1442) Pulse Rate: 77 (04/25 1442) Intake/Output from previous day: 04/24 0701 - 04/25 0700 In: 2910 [P.O.:1250; I.V.:1510] Out: 4000 [Urine:4000] Intake/Output from this shift: Total I/O In: 1760 [P.O.:1270; I.V.:40; Other:450] Out: 10250 [Urine:10250] Vent settings for last 24 hours:    Labs: Potassium  Date Value Ref Range Status  02/02/2017 4.2 3.5 - 5.1 mmol/L Final  11/04/2014 3.6 3.5 - 5.1 mmol/L Final   Magnesium  Date Value Ref Range Status  02/02/2017 1.5 (L) 1.7 - 2.4 mg/dL Final  03/18/2014 1.9 mg/dL Final    Comment:    1.8-2.4 THERAPEUTIC RANGE: 4-7 mg/dL TOXIC: > 10 mg/dL  -----------------------       Recent Labs  02/02/17 0602 02/02/17 1150 02/02/17 1649  GLUCAP 110* 91 121*    Assessment: 41 y/o M with a known h/o familial hypertriglyceridemia and pancreatitis with multiple admissions for the same. Insulin drip ordered; patient was hypokalemic at baseline.   K= 4.2   Plan:  No additional supplementation warranted. Patient is no longer on Insulin gtt therapy.   Larene Beach, [facility] Clinical Pharmacist 02/02/2017,7:00 PM

## 2017-02-03 ENCOUNTER — Telehealth: Payer: Self-pay | Admitting: *Deleted

## 2017-02-03 LAB — TRIGLYCERIDES: Triglycerides: 616 mg/dL — ABNORMAL HIGH (ref <150)

## 2017-02-03 LAB — CBC WITH DIFFERENTIAL/PLATELET
BASOS ABS: 0.1 10*3/uL (ref 0–0.1)
Basophils Relative: 1 %
EOS PCT: 4 %
Eosinophils Absolute: 0.2 10*3/uL (ref 0–0.7)
HCT: 37 % — ABNORMAL LOW (ref 40.0–52.0)
Hemoglobin: 13.1 g/dL (ref 13.0–18.0)
Lymphocytes Relative: 30 %
Lymphs Abs: 1.6 10*3/uL (ref 1.0–3.6)
MCH: 28.3 pg (ref 26.0–34.0)
MCHC: 35.3 g/dL (ref 32.0–36.0)
MCV: 80 fL (ref 80.0–100.0)
Monocytes Absolute: 0.3 10*3/uL (ref 0.2–1.0)
Monocytes Relative: 7 %
NEUTROS ABS: 3.1 10*3/uL (ref 1.4–6.5)
NEUTROS PCT: 58 %
PLATELETS: 143 10*3/uL — AB (ref 150–440)
RBC: 4.62 MIL/uL (ref 4.40–5.90)
RDW: 16.4 % — AB (ref 11.5–14.5)
WBC: 5.3 10*3/uL (ref 3.8–10.6)

## 2017-02-03 LAB — BASIC METABOLIC PANEL
Anion gap: 10 (ref 5–15)
BUN: 7 mg/dL (ref 6–20)
CALCIUM: 9 mg/dL (ref 8.9–10.3)
CO2: 29 mmol/L (ref 22–32)
CREATININE: 0.88 mg/dL (ref 0.61–1.24)
Chloride: 98 mmol/L — ABNORMAL LOW (ref 101–111)
GFR calc Af Amer: >60 mL/min (ref >60)
GLUCOSE: 171 mg/dL — AB (ref 65–99)
Potassium: 4.2 mmol/L (ref 3.5–5.1)
Sodium: 137 mmol/L (ref 135–145)

## 2017-02-03 LAB — GLUCOSE, CAPILLARY
Glucose-Capillary: 153 mg/dL — ABNORMAL HIGH (ref 65–99)
Glucose-Capillary: 166 mg/dL — ABNORMAL HIGH (ref 65–99)
Glucose-Capillary: 187 mg/dL — ABNORMAL HIGH (ref 65–99)

## 2017-02-03 LAB — MAGNESIUM: Magnesium: 2 mg/dL (ref 1.7–2.4)

## 2017-02-03 MED ORDER — PANCRELIPASE (LIP-PROT-AMYL) 36000-114000 UNITS PO CPEP: ORAL_CAPSULE | ORAL | 0 refills | Status: DC

## 2017-02-03 MED ORDER — OXYCODONE HCL 5 MG PO TABS: 5.0000 mg | ORAL_TABLET | Freq: Four times a day (QID) | ORAL | 0 refills | Status: DC | PRN

## 2017-02-03 MED ORDER — CEPHALEXIN 500 MG PO CAPS: ORAL_CAPSULE | ORAL | Status: DC

## 2017-02-03 MED ORDER — OXYCODONE HCL 10 MG PO TABS: 10.0000 mg | ORAL_TABLET | Freq: Four times a day (QID) | ORAL | 0 refills | Status: DC | PRN

## 2017-02-03 NOTE — Discharge Summary (Signed)
[place] at [place] NAME: Sergio [name]    MR#:  [id number]  DATE OF BIRTH:  1976/03/20  DATE OF ADMISSION:  01/28/2017 ADMITTING PHYSICIAN: Harrie Foreman, MD  DATE OF DISCHARGE: 02/03/2017 12:30 PM  PRIMARY CARE PHYSICIAN: Crecencio Mc, MD    ADMISSION DIAGNOSIS:  Other chronic pancreatitis ([place]) [K86.1]  DISCHARGE DIAGNOSIS:  Active Problems:   Chronic pancreatitis ([place])   SECONDARY DIAGNOSIS:   Past Medical History:  Diagnosis Date  . Anxiety   . Chicken pox   . Depression   . Diabetes mellitus   . Familial hypertriglyceridemia    severe  . Gastric ulcer 2009  . HTN (hypertension)   . Morbid obesity ([place])    s/p bariatric sleeve surgery 01/2015  . Recurrent acute pancreatitis    secondary to hypertriglyceridemia   . Sleep apnea 1999   uses CPAP    HOSPITAL COURSE:   1. Severe hypertriglyceridemia. The patient was admitted to the ICU with triglycerides greater than 5000. He was started on insulin drip. Upon discharge the triglycerides are down around 600. Patient is also on Lovaza and gemfibrozil. 2. Acute on chronic pancreatitis. Patient tolerating diet. Patient is on Creon. 3. Type 2 diabetes mellitus. Patient will go back on his insulin pump. 4. Hyperlipidemia unspecified on Crestor 5. Depression on Zoloft  DISCHARGE CONDITIONS:   Satisfactory  CONSULTS OBTAINED:   critical care specialist  DRUG ALLERGIES:   Allergies  Allergen Reactions  . Codeine Anaphylaxis  . Ivp Dye [Iodinated Diagnostic Agents] Other (See Comments)    Kidneys stop working    DISCHARGE MEDICATIONS:   Discharge Medication List as of 02/03/2017 10:53 AM    START taking these medications   Details  cephALEXin (KEFLEX) 500 MG capsule Finish up course at home. One tab three times a day, No Print      CONTINUE these medications which have CHANGED   Details  lipase/protease/amylase (CREON) 36000 UNITS CPEP capsule 2 tablets  with meals and 1 tablet with snacks, Print    oxyCODONE 10 MG TABS Take 1 tablet (10 mg total) by mouth every 6 (six) hours as needed for breakthrough pain., Starting Thu 02/03/2017, Print      CONTINUE these medications which have NOT CHANGED   Details  ALPRAZolam (XANAX) 0.5 MG tablet Take 0.5 mg by mouth 3 (three) times daily as needed for anxiety. , Historical Med    finasteride (PROSCAR) 5 MG tablet Take 1 tablet (5 mg total) by mouth daily., Starting 01/27/2016, Until Discontinued, Print    gemfibrozil (LOPID) 600 MG tablet Take 1 tablet (600 mg total) by mouth 2 (two) times daily before a meal., Starting 08/15/2015, Until Discontinued, Print    insulin aspart (NOVOLOG) 100 UNIT/ML injection Inject 1 Dose into the skin See admin instructions. Use up to 120 units daily via insulin pump, Starting Thu 09/11/2015, Historical Med    omega-3 acid ethyl esters (LOVAZA) 1 g capsule Take 4 g by mouth at bedtime., Until Discontinued, Historical Med    pregabalin (LYRICA) 300 MG capsule Take 300 mg by mouth at bedtime. , Until Discontinued, Historical Med    rosuvastatin (CRESTOR) 10 MG tablet Take 1 tablet (10 mg total) by mouth daily at 6 PM., Starting Wed 08/04/2016, Print    sertraline (ZOLOFT) 100 MG tablet Take 1 tablet (100 mg total) by mouth daily., Starting Thu 12/02/2016, Print    tamsulosin (FLOMAX) 0.4 MG CAPS capsule Take 1 capsule (0.4  mg total) by mouth daily., Starting 01/27/2016, Until Discontinued, Print    traZODone (DESYREL) 100 MG tablet Take 200 mg by mouth at bedtime. , Historical Med         DISCHARGE INSTRUCTIONS:   Follow-up PMD one week Follow-up with Dr. Gabriel Carina one to 2 weeks  If you experience worsening of your admission symptoms, develop shortness of breath, life threatening emergency, suicidal or homicidal thoughts you must seek medical attention immediately by calling 911 or calling your MD immediately  if symptoms less severe.  You Must read complete  instructions/literature along with all the possible adverse reactions/side effects for all the Medicines you take and that have been prescribed to you. Take any new Medicines after you have completely understood and accept all the possible adverse reactions/side effects.   Please note  You were cared for by a hospitalist during your hospital stay. If you have any questions about your discharge medications or the care you received while you were in the hospital after you are discharged, you can call the unit and asked to speak with the hospitalist on call if the hospitalist that took care of you is not available. Once you are discharged, your primary care physician will handle any further medical issues. Please note that NO REFILLS for any discharge medications will be authorized once you are discharged, as it is imperative that you return to your primary care physician (or establish a relationship with a primary care physician if you do not have one) for your aftercare needs so that they can reassess your need for medications and monitor your lab values.    Today   CHIEF COMPLAINT:   Chief Complaint  Patient presents with  . Abdominal Pain    HISTORY OF PRESENT ILLNESS:  Sergio [name]  is a 41 y.o. male with a known history of Hypertriglyceridemia presented with abdominal pain   VITAL SIGNS:  Blood pressure (!) 116/59, pulse 67, temperature 97.5 F (36.4 C), temperature source Oral, resp. rate 18, height 5\' 11"  (1.803 m), weight 113.4 kg (249 lb 14.4 oz), SpO2 94 %.    PHYSICAL EXAMINATION:  GENERAL:  41 y.o.-year-old patient lying in the bed with no acute distress.  EYES: Pupils equal, round, reactive to light and accommodation. No scleral icterus. Extraocular muscles intact.  HEENT: Head atraumatic, normocephalic. Oropharynx and nasopharynx clear.  NECK:  Supple, no jugular venous distention. No thyroid enlargement, no tenderness.  LUNGS: Normal breath sounds bilaterally, no  wheezing, rales,rhonchi or crepitation. No use of accessory muscles of respiration.  CARDIOVASCULAR: S1, S2 normal. No murmurs, rubs, or gallops.  ABDOMEN: Soft, Slight epigastric tenderness, non-distended. Bowel sounds present. No organomegaly or mass.  EXTREMITIES: Trace edema, no cyanosis, or clubbing.  NEUROLOGIC: Cranial nerves II through XII are intact. Muscle strength 5/5 in all extremities. Sensation intact. Gait not checked.  PSYCHIATRIC: The patient is alert and oriented x 3.  SKIN: No obvious rash, lesion, or ulcer.   DATA REVIEW:   CBC  Recent Labs Lab 02/03/17 0430  WBC 5.3  HGB 13.1  HCT 37.0*  PLT 143*    Chemistries   Recent Labs Lab 01/28/17 0149  02/03/17 0430  NA 134*  < > 137  K 3.3*  < > 4.2  CL 92*  < > 98*  CO2 23  < > 29  GLUCOSE 160*  < > 171*  BUN 10  < > 7  CREATININE 0.59*  < > 0.88  CALCIUM 9.1  < > 9.0  MG  --   < > 2.0  AST 34  --   --   ALT 25  --   --   ALKPHOS 43  --   --   BILITOT 0.3  --   --   < > = values in this interval not displayed.   Microbiology Results  Results for orders placed or performed during the hospital encounter of 01/28/17  MRSA PCR Screening     Status: None   Collection Time: 01/31/17  4:25 PM  Result Value Ref Range Status   MRSA by PCR NEGATIVE NEGATIVE Final    Comment:        The GeneXpert MRSA Assay (FDA approved for NASAL specimens only), is one component of a comprehensive MRSA colonization surveillance program. It is not intended to diagnose MRSA infection nor to guide or monitor treatment for MRSA infections.        Management plans discussed with the patient, And he is in agreement.  CODE STATUS:  Code Status History    Date Active Date Inactive Code Status Order ID Comments User Context   01/28/2017  8:40 AM 02/03/2017  3:43 PM Full Code [id number]  Harrie Foreman, MD Inpatient   07/24/2016 10:51 PM 08/04/2016  6:37 PM Full Code [id number]  Theodoro Grist, MD Inpatient   03/11/2016   4:58 AM 03/19/2016  4:44 PM Full Code [id number]  Harrie Foreman, MD Inpatient   01/17/2016  2:50 AM 01/27/2016  5:47 PM Full Code [id number]  Harrie Foreman, MD Inpatient   08/01/2015 11:35 PM 08/15/2015  4:43 PM Full Code [id number]  Lytle Butte, MD ED   07/21/2014 11:46 AM 07/27/2014  6:16 PM Full Code [id number]  Oswald Hillock, MD Inpatient      TOTAL TIME TAKING CARE OF THIS PATIENT: 35 minutes.    Loletha Grayer M.D on 02/03/2017 at 4:38 PM  Between 7am to 6pm - Pager - [phone]  After 6pm go to [email or web address] - password [email or web address]  [facility] Office  [phone]  CC: Primary care physician; Crecencio Mc, MD

## 2017-02-03 NOTE — Progress Notes (Signed)
PT Cancellation Note  Patient Details Name: ROMELO SCIANDRA MRN: [id number] DOB: December 19, 1975   Cancelled Treatment:    Reason Eval/Treat Not Completed: Other (comment) (PT spoke with RN prior to attempting eval, and RN reported pt discharging soon and does not require PT at this time and pt has walked around unit).   Miller,Jennifer L 02/03/2017, 11:43 AM   Geoffry Paradise, PT,DPT 02/03/17 11:44 AM

## 2017-02-03 NOTE — Telephone Encounter (Signed)
Patient will discharge from [facility] on 04/26 . Patient will need a time and date to see Dr. Derrel Nip for a HFU. Pt contact [phone]

## 2017-02-04 NOTE — Telephone Encounter (Signed)
Second attempt made for TCM no answer left message for patient to call office.

## 2017-02-04 NOTE — Telephone Encounter (Signed)
First attempt made to reach patient for TCM call and set up follow up appointment left message for patient to call office will continue to monitor.

## 2017-02-07 NOTE — Telephone Encounter (Signed)
Third attempt made for TCM and to set up [place]. Left message  To return call to office.

## 2017-03-25 ENCOUNTER — Telehealth: Payer: Self-pay | Admitting: *Deleted

## 2017-03-25 NOTE — Telephone Encounter (Signed)
Noted.  Please make note to f/u on pt.

## 2017-03-25 NOTE — Telephone Encounter (Signed)
Patient Name: Sergio [name] DOB: [birth year] Initial Comment Caller states that he was bitten by a tick and not sure when he was bitten and it was removed yesterday. It was on his back and that area is red and it is raised. It is really hard and it is the size of a 50 cent. Nurse Assessment Nurse: Yolanda Bonine, RN, Erin Date/Time (Eastern Time): 03/25/2017 11:20:49 AM Confirm and document reason for call. If symptomatic, describe symptoms. ---Caller states that he was bitten by a tick and not sure when he was bitten and it was removed yesterday. He has been camping and has had misquote bites but wife says it was a tick and she removed it. They have saved the tic. It was on his back and that area is red and it is raised. It is really hard and it is the size of a 50 cent. No fever Does the patient have any new or worsening symptoms? ---Yes Will a triage be completed? ---Yes Related visit to physician within the last 2 weeks? ---No Does the PT have any chronic conditions? (i.e. diabetes, asthma, etc.) ---Yes List chronic conditions. ---DM on insulin pump Pancreatitis Chronic High Triglycerides Is this a behavioral health or substance abuse call? ---No Guidelines Guideline Title Affirmed Question Affirmed Notes Tick Bite [1] Red or very tender (to touch) area AND [2] started over 24 hours after the bite Final Disposition User See Physician within 24 Hours [place], RN, Clifford states he will call [facility] tomorrow if he isn't seen today. Going to call his dermatology office or maybe go to [facility] Disagree/Comply: Comply

## 2017-03-25 NOTE — Telephone Encounter (Signed)
Patient was bit by a tick, the tick was removed 06/14. The area was is located on his back, the area raised, hard and itchy with a size of a 50 cent piece .  Pt contact [phone] Pt transferred to Nurse Line

## 2017-03-25 NOTE — Telephone Encounter (Signed)
Patient stated that he will see his dermatologist today Pt contact [phone]

## 2017-03-25 NOTE — Telephone Encounter (Signed)
Patient going to [facility].

## 2017-05-13 ENCOUNTER — Inpatient Hospital Stay
Admission: EM | Admit: 2017-05-13 | Discharge: 2017-05-23 | DRG: 439 | Disposition: A | Discharge status: Home / Self Care | Payer: BLUE CROSS/BLUE SHIELD | Attending: Internal Medicine | Admitting: Internal Medicine

## 2017-05-13 ENCOUNTER — Encounter: Payer: Self-pay | Admitting: Medical Oncology

## 2017-05-13 ENCOUNTER — Inpatient Hospital Stay: Payer: BLUE CROSS/BLUE SHIELD

## 2017-05-13 DIAGNOSIS — E871 Hypo-osmolality and hyponatremia: Secondary | ICD-10-CM | POA: Diagnosis present

## 2017-05-13 DIAGNOSIS — E785 Hyperlipidemia, unspecified: Secondary | ICD-10-CM | POA: Diagnosis present

## 2017-05-13 DIAGNOSIS — R34 Anuria and oliguria: Secondary | ICD-10-CM | POA: Diagnosis present

## 2017-05-13 DIAGNOSIS — R109 Unspecified abdominal pain: Secondary | ICD-10-CM

## 2017-05-13 DIAGNOSIS — R112 Nausea with vomiting, unspecified: Secondary | ICD-10-CM | POA: Diagnosis not present

## 2017-05-13 DIAGNOSIS — Z9119 Patient's noncompliance with other medical treatment and regimen: Secondary | ICD-10-CM | POA: Diagnosis not present

## 2017-05-13 DIAGNOSIS — E119 Type 2 diabetes mellitus without complications: Secondary | ICD-10-CM | POA: Diagnosis present

## 2017-05-13 DIAGNOSIS — T402X5A Adverse effect of other opioids, initial encounter: Secondary | ICD-10-CM | POA: Diagnosis present

## 2017-05-13 DIAGNOSIS — E781 Pure hyperglyceridemia: Secondary | ICD-10-CM

## 2017-05-13 DIAGNOSIS — K858 Other acute pancreatitis without necrosis or infection: Principal | ICD-10-CM | POA: Diagnosis present

## 2017-05-13 DIAGNOSIS — K8581 Other acute pancreatitis with uninfected necrosis: Secondary | ICD-10-CM | POA: Diagnosis not present

## 2017-05-13 DIAGNOSIS — R339 Retention of urine, unspecified: Secondary | ICD-10-CM | POA: Diagnosis present

## 2017-05-13 DIAGNOSIS — E860 Dehydration: Secondary | ICD-10-CM | POA: Diagnosis present

## 2017-05-13 DIAGNOSIS — Z79899 Other long term (current) drug therapy: Secondary | ICD-10-CM | POA: Diagnosis not present

## 2017-05-13 DIAGNOSIS — K8500 Idiopathic acute pancreatitis without necrosis or infection: Secondary | ICD-10-CM | POA: Diagnosis not present

## 2017-05-13 DIAGNOSIS — Z885 Allergy status to narcotic agent status: Secondary | ICD-10-CM | POA: Diagnosis not present

## 2017-05-13 DIAGNOSIS — I10 Essential (primary) hypertension: Secondary | ICD-10-CM | POA: Diagnosis present

## 2017-05-13 DIAGNOSIS — K859 Acute pancreatitis without necrosis or infection, unspecified: Secondary | ICD-10-CM | POA: Diagnosis not present

## 2017-05-13 DIAGNOSIS — Z91041 Radiographic dye allergy status: Secondary | ICD-10-CM | POA: Diagnosis not present

## 2017-05-13 DIAGNOSIS — Z8711 Personal history of peptic ulcer disease: Secondary | ICD-10-CM

## 2017-05-13 DIAGNOSIS — G4733 Obstructive sleep apnea (adult) (pediatric): Secondary | ICD-10-CM | POA: Diagnosis present

## 2017-05-13 DIAGNOSIS — Z794 Long term (current) use of insulin: Secondary | ICD-10-CM

## 2017-05-13 DIAGNOSIS — Z8249 Family history of ischemic heart disease and other diseases of the circulatory system: Secondary | ICD-10-CM | POA: Diagnosis not present

## 2017-05-13 DIAGNOSIS — F418 Other specified anxiety disorders: Secondary | ICD-10-CM | POA: Diagnosis present

## 2017-05-13 LAB — GLUCOSE, CAPILLARY
GLUCOSE-CAPILLARY: 164 mg/dL — AB (ref 65–99)
GLUCOSE-CAPILLARY: 136 mg/dL — AB (ref 65–99)
GLUCOSE-CAPILLARY: 128 mg/dL — AB (ref 65–99)
GLUCOSE-CAPILLARY: 137 mg/dL — AB (ref 65–99)
GLUCOSE-CAPILLARY: 143 mg/dL — AB (ref 65–99)
Glucose-Capillary: 149 mg/dL — ABNORMAL HIGH (ref 65–99)
Glucose-Capillary: 151 mg/dL — ABNORMAL HIGH (ref 65–99)
Glucose-Capillary: 125 mg/dL — ABNORMAL HIGH (ref 65–99)
Glucose-Capillary: 150 mg/dL — ABNORMAL HIGH (ref 65–99)
Glucose-Capillary: 150 mg/dL — ABNORMAL HIGH (ref 65–99)
Glucose-Capillary: 151 mg/dL — ABNORMAL HIGH (ref 65–99)

## 2017-05-13 LAB — COMPREHENSIVE METABOLIC PANEL
ALK PHOS: 48 U/L (ref 38–126)
ALT: 13 U/L — AB (ref 17–63)
ANION GAP: 10 (ref 5–15)
AST: 23 U/L (ref 15–41)
Albumin: 4.2 g/dL (ref 3.5–5.0)
BILIRUBIN TOTAL: 2.2 mg/dL — AB (ref 0.3–1.2)
BUN: 11 mg/dL (ref 6–20)
CALCIUM: 8.8 mg/dL — AB (ref 8.9–10.3)
CO2: 23 mmol/L (ref 22–32)
CREATININE: 0.6 mg/dL — AB (ref 0.61–1.24)
Chloride: 95 mmol/L — ABNORMAL LOW (ref 101–111)
Glucose, Bld: 221 mg/dL — ABNORMAL HIGH (ref 65–99)
Potassium: 3.6 mmol/L (ref 3.5–5.1)
SODIUM: 128 mmol/L — AB (ref 135–145)
TOTAL PROTEIN: 6.6 g/dL (ref 6.5–8.1)

## 2017-05-13 LAB — CBC WITH DIFFERENTIAL/PLATELET
Basophils Absolute: 0.1 10*3/uL (ref 0–0.1)
Basophils Relative: 1 %
EOS ABS: 0.1 10*3/uL (ref 0–0.7)
Eosinophils Relative: 2 %
HEMATOCRIT: 39.2 % — AB (ref 40.0–52.0)
HEMOGLOBIN: 13.9 g/dL (ref 13.0–18.0)
LYMPHS ABS: 1.8 10*3/uL (ref 1.0–3.6)
LYMPHS PCT: 27 %
MCH: 27.8 pg (ref 26.0–34.0)
MCHC: 35.5 g/dL (ref 32.0–36.0)
MCV: 78.4 fL — AB (ref 80.0–100.0)
MONOS PCT: 6 %
Monocytes Absolute: 0.4 10*3/uL (ref 0.2–1.0)
NEUTROS PCT: 64 %
Neutro Abs: 4.2 10*3/uL (ref 1.4–6.5)
Platelets: 212 10*3/uL (ref 150–440)
RBC: 5 MIL/uL (ref 4.40–5.90)
RDW: 15.8 % — ABNORMAL HIGH (ref 11.5–14.5)
WBC: 6.6 10*3/uL (ref 3.8–10.6)

## 2017-05-13 LAB — TRIGLYCERIDES: Triglycerides: >5000 mg/dL — ABNORMAL HIGH (ref <150)

## 2017-05-13 LAB — LIPASE, BLOOD: LIPASE: 246 U/L — AB (ref 11–51)

## 2017-05-13 LAB — PHOSPHORUS: PHOSPHORUS: 3.9 mg/dL (ref 2.5–4.6)

## 2017-05-13 LAB — MAGNESIUM: MAGNESIUM: 3.4 mg/dL — AB (ref 1.7–2.4)

## 2017-05-13 LAB — CHOLESTEROL, TOTAL: CHOLESTEROL: 612 mg/dL — AB (ref 0–200)

## 2017-05-13 LAB — MRSA PCR SCREENING: MRSA by PCR: NEGATIVE

## 2017-05-13 MED ORDER — ONDANSETRON HCL 4 MG/2ML IJ SOLN
4.0000 mg | Freq: Four times a day (QID) | INTRAMUSCULAR | Status: DC | PRN
  Administered 2017-05-13 – 2017-05-19 (×8): 4 mg via INTRAVENOUS
  Filled 2017-05-13 (×8): qty 2

## 2017-05-13 MED ORDER — SODIUM CHLORIDE 4 MEQ/ML IV SOLN
INTRAVENOUS | Status: DC
  Administered 2017-05-13 – 2017-05-14 (×2): via INTRAVENOUS
  Filled 2017-05-13 (×7): qty 1000

## 2017-05-13 MED ORDER — HYDROMORPHONE HCL 1 MG/ML IJ SOLN
1.0000 mg | Freq: Once | INTRAMUSCULAR | Status: AC
  Administered 2017-05-13: 1 mg via INTRAVENOUS
  Filled 2017-05-13: qty 1

## 2017-05-13 MED ORDER — HYDROMORPHONE HCL 1 MG/ML IJ SOLN
1.0000 mg | INTRAMUSCULAR | Status: DC | PRN
  Administered 2017-05-13 (×2): 2 mg via INTRAVENOUS
  Administered 2017-05-13 – 2017-05-14 (×3): 1 mg via INTRAVENOUS
  Administered 2017-05-14: 2 mg via INTRAVENOUS
  Administered 2017-05-14 (×3): 1 mg via INTRAVENOUS
  Administered 2017-05-15 (×4): 2 mg via INTRAVENOUS
  Administered 2017-05-15 – 2017-05-16 (×5): 1 mg via INTRAVENOUS
  Administered 2017-05-16 (×2): 2 mg via INTRAVENOUS
  Administered 2017-05-16: 1 mg via INTRAVENOUS
  Administered 2017-05-16 (×3): 2 mg via INTRAVENOUS
  Administered 2017-05-16 – 2017-05-17 (×3): 1 mg via INTRAVENOUS
  Administered 2017-05-17 (×2): 2 mg via INTRAVENOUS
  Administered 2017-05-17 – 2017-05-18 (×3): 1 mg via INTRAVENOUS
  Administered 2017-05-18 (×3): 2 mg via INTRAVENOUS
  Administered 2017-05-18: 1 mg via INTRAVENOUS
  Administered 2017-05-18 – 2017-05-19 (×7): 2 mg via INTRAVENOUS
  Administered 2017-05-20 (×3): 1 mg via INTRAVENOUS
  Administered 2017-05-20: 2 mg via INTRAVENOUS
  Administered 2017-05-20 – 2017-05-21 (×2): 1 mg via INTRAVENOUS
  Administered 2017-05-21 (×3): 2 mg via INTRAVENOUS
  Administered 2017-05-21 – 2017-05-22 (×3): 1 mg via INTRAVENOUS
  Administered 2017-05-22: 2 mg via INTRAVENOUS
  Administered 2017-05-22 (×3): 1 mg via INTRAVENOUS
  Administered 2017-05-23 (×2): 2 mg via INTRAVENOUS
  Filled 2017-05-13 (×3): qty 1
  Filled 2017-05-13 (×3): qty 2
  Filled 2017-05-13 (×2): qty 1
  Filled 2017-05-13 (×2): qty 2
  Filled 2017-05-13: qty 1
  Filled 2017-05-13: qty 2
  Filled 2017-05-13 (×2): qty 1
  Filled 2017-05-13: qty 2
  Filled 2017-05-13: qty 1
  Filled 2017-05-13: qty 2
  Filled 2017-05-13 (×4): qty 1
  Filled 2017-05-13: qty 2
  Filled 2017-05-13 (×3): qty 1
  Filled 2017-05-13: qty 2
  Filled 2017-05-13 (×2): qty 1
  Filled 2017-05-13: qty 2
  Filled 2017-05-13: qty 1
  Filled 2017-05-13 (×3): qty 2
  Filled 2017-05-13 (×2): qty 1
  Filled 2017-05-13 (×5): qty 2
  Filled 2017-05-13: qty 1
  Filled 2017-05-13: qty 2
  Filled 2017-05-13: qty 1
  Filled 2017-05-13: qty 2
  Filled 2017-05-13: qty 1
  Filled 2017-05-13 (×2): qty 2
  Filled 2017-05-13 (×2): qty 1
  Filled 2017-05-13 (×3): qty 2
  Filled 2017-05-13: qty 1
  Filled 2017-05-13: qty 2
  Filled 2017-05-13: qty 1
  Filled 2017-05-13 (×3): qty 2
  Filled 2017-05-13: qty 1
  Filled 2017-05-13 (×2): qty 2
  Filled 2017-05-13: qty 1

## 2017-05-13 MED ORDER — HYDROMORPHONE HCL 1 MG/ML IJ SOLN: 1.0000 mg | INTRAMUSCULAR | Status: DC | PRN

## 2017-05-13 MED ORDER — ONDANSETRON HCL 4 MG/2ML IJ SOLN
4.0000 mg | Freq: Once | INTRAMUSCULAR | Status: AC
  Administered 2017-05-13: 4 mg via INTRAVENOUS
  Filled 2017-05-13: qty 2

## 2017-05-13 MED ORDER — LORAZEPAM 2 MG/ML IJ SOLN: 0.5000 mg | Freq: Four times a day (QID) | INTRAMUSCULAR | Status: DC | PRN

## 2017-05-13 MED ORDER — DEXTROSE-NACL 5-0.9 % IV SOLN
INTRAVENOUS | Status: DC
  Administered 2017-05-13: 15:00:00 via INTRAVENOUS

## 2017-05-13 MED ORDER — ALPRAZOLAM 0.5 MG PO TABS
0.5000 mg | ORAL_TABLET | Freq: Every evening | ORAL | Status: DC | PRN
  Administered 2017-05-14 – 2017-05-22 (×4): 0.5 mg via ORAL
  Filled 2017-05-13 (×4): qty 1

## 2017-05-13 MED ORDER — MORPHINE SULFATE (PF) 2 MG/ML IV SOLN: 1.0000 mg | INTRAVENOUS | Status: DC | PRN

## 2017-05-13 MED ORDER — POTASSIUM CHLORIDE 2 MEQ/ML IV SOLN
INTRAVENOUS | Status: DC
  Filled 2017-05-13 (×4): qty 1000

## 2017-05-13 MED ORDER — MORPHINE SULFATE (PF) 4 MG/ML IV SOLN: 4.0000 mg | Freq: Once | INTRAVENOUS | Status: DC

## 2017-05-13 MED ORDER — FINASTERIDE 5 MG PO TABS
5.0000 mg | ORAL_TABLET | Freq: Every day | ORAL | Status: DC
  Administered 2017-05-14 – 2017-05-23 (×10): 5 mg via ORAL
  Filled 2017-05-13 (×10): qty 1

## 2017-05-13 MED ORDER — SODIUM CHLORIDE 0.9 % IV SOLN
INTRAVENOUS | Status: DC
  Administered 2017-05-13 – 2017-05-15 (×4): 5 [IU]/h via INTRAVENOUS
  Administered 2017-05-16: 20:00:00 via INTRAVENOUS
  Administered 2017-05-18: 5 [IU]/h via INTRAVENOUS
  Filled 2017-05-13 (×9): qty 1

## 2017-05-13 MED ORDER — SODIUM CHLORIDE 0.9 % IV SOLN
1000.0000 mL | Freq: Once | INTRAVENOUS | Status: AC
  Administered 2017-05-13: 1000 mL via INTRAVENOUS

## 2017-05-13 MED ORDER — ONDANSETRON HCL 4 MG PO TABS: 4.0000 mg | ORAL_TABLET | Freq: Four times a day (QID) | ORAL | Status: DC | PRN

## 2017-05-13 MED ORDER — ENOXAPARIN SODIUM 40 MG/0.4ML ~~LOC~~ SOLN
40.0000 mg | SUBCUTANEOUS | Status: DC
  Administered 2017-05-13 – 2017-05-22 (×10): 40 mg via SUBCUTANEOUS
  Filled 2017-05-13 (×10): qty 0.4

## 2017-05-13 MED ORDER — PREGABALIN 75 MG PO CAPS
300.0000 mg | ORAL_CAPSULE | Freq: Every day | ORAL | Status: DC
  Administered 2017-05-13 – 2017-05-22 (×10): 300 mg via ORAL
  Filled 2017-05-13 (×11): qty 4

## 2017-05-13 MED ORDER — MORPHINE SULFATE (PF) 2 MG/ML IV SOLN: 2.0000 mg | INTRAVENOUS | Status: DC | PRN

## 2017-05-13 NOTE — H&P (Signed)
[place] at [place] NAME: Sergio [name]    MR#:  [id number]  DATE OF BIRTH:  [birth year]  DATE OF ADMISSION:  05/13/2017  PRIMARY CARE PHYSICIAN: Crecencio Mc, MD   REQUESTING/REFERRING PHYSICIAN: Earleen Newport, MD  CHIEF COMPLAINT:  Epigastric abdominal pain nausea and vomiting  HISTORY OF PRESENT ILLNESS:  Sergio [name]  is a 41 y.o. male with a known history of Insulin requiring diabetes mellitus, hypertension, history of familial hypertriglyceridemia, gastric ulcer and multiple other medical problems was admitted to the hospital with acute pancreatitis from hypertriglyceridemia in the past. Patient is presenting with severe epigastric abdominal pain radiating to the left upper quadrant associated with nausea and vomiting for the past 2-3 days which has been progressively getting worse. Patient was drinking alcohol but quit several months ago. Lipase is elevated abdominal ultrasound is pending stat triglyceride level is pending at this time. ED physician has discussed with Dr. Gabriel Carina and patient is started on insulin drip and D5 normal saline hospitalist team is called to admit the patient. Patient reports his epigastric abdominal pain is 9-10 out of 10 without pain medicine  PAST MEDICAL HISTORY:   Past Medical History:  Diagnosis Date  . Anxiety   . Chicken pox   . Depression   . Diabetes mellitus   . Familial hypertriglyceridemia    severe  . Gastric ulcer 2009  . HTN (hypertension)   . Morbid obesity ([place])    s/p bariatric sleeve surgery 01/2015  . Recurrent acute pancreatitis    secondary to hypertriglyceridemia   . Sleep apnea 1999   uses CPAP    PAST SURGICAL HISTOIRY:   Past Surgical History:  Procedure Laterality Date  . CHOLECYSTECTOMY    . LAPAROSCOPIC GASTRIC SLEEVE RESECTION    . [place] SURGERY  2008  . TONSILLECTOMY  2003  . VASECTOMY      SOCIAL HISTORY:   Social History  Substance Use  Topics  . Smoking status: Never Smoker  . Smokeless tobacco: Never Used  . Alcohol use No     Comment: once month    FAMILY HISTORY:   Family History  Problem Relation Age of Onset  . Adopted: Yes  . Hypertension Mother   . Hyperlipidemia Mother   . Hypertension Father   . Hyperlipidemia Father     DRUG ALLERGIES:   Allergies  Allergen Reactions  . Codeine Anaphylaxis  . Ivp Dye [Iodinated Diagnostic Agents] Other (See Comments)    Kidneys stop working    REVIEW OF SYSTEMS:  CONSTITUTIONAL: No fever, fatigue or weakness.  EYES: No blurred or double vision.  EARS, NOSE, AND THROAT: No tinnitus or ear pain.  RESPIRATORY: No cough, shortness of breath, wheezing or hemoptysis.  CARDIOVASCULAR: No chest pain, orthopnea, edema.  GASTROINTESTINAL: Reporting intractable nausea, vomiting, epigastric abdominal pain.  GENITOURINARY: No dysuria, hematuria.  ENDOCRINE: No polyuria, nocturia,  HEMATOLOGY: No anemia, easy bruising or bleeding SKIN: No rash or lesion. MUSCULOSKELETAL: No joint pain or arthritis.   NEUROLOGIC: No tingling, numbness, weakness.  PSYCHIATRY: No anxiety or depression.   MEDICATIONS AT HOME:   Prior to Admission medications   Medication Sig Start Date End Date Taking? Authorizing Provider  ALPRAZolam Duanne Moron) 0.5 MG tablet Take 0.5 mg by mouth 3 (three) times daily as needed for anxiety.    Yes [provider]  finasteride (PROSCAR) 5 MG tablet Take 1 tablet (5 mg total) by mouth daily. Patient taking differently:  Take 5 mg by mouth at bedtime.  01/27/16  Yes Vaughan Basta, MD  gemfibrozil (LOPID) 600 MG tablet Take 1 tablet (600 mg total) by mouth 2 (two) times daily before a meal. 08/15/15  Yes Wieting, Richard, MD  insulin aspart (NOVOLOG) 100 UNIT/ML injection Inject 1 Dose into the skin See admin instructions. Use up to 120 units daily via insulin pump 09/11/15  Yes [provider]  lipase/protease/amylase (CREON) 36000 UNITS  CPEP capsule 2 tablets with meals and 1 tablet with snacks 02/03/17  Yes Wieting, Richard, MD  omega-3 acid ethyl esters (LOVAZA) 1 g capsule Take 4 g by mouth at bedtime.   Yes [provider]  oxyCODONE 10 MG TABS Take 1 tablet (10 mg total) by mouth every 6 (six) hours as needed for breakthrough pain. 02/03/17  Yes Wieting, Richard, MD  pregabalin (LYRICA) 300 MG capsule Take 300 mg by mouth at bedtime.    Yes [provider]  rosuvastatin (CRESTOR) 10 MG tablet Take 1 tablet (10 mg total) by mouth daily at 6 PM. 08/04/16  Yes Demetrios Loll, MD  sertraline (ZOLOFT) 100 MG tablet Take 1 tablet (100 mg total) by mouth daily. 12/02/16  Yes Crecencio Mc, MD  tamsulosin (FLOMAX) 0.4 MG CAPS capsule Take 1 capsule (0.4 mg total) by mouth daily. Patient taking differently: Take 0.4 mg by mouth at bedtime.  01/27/16  Yes Vaughan Basta, MD  traZODone (DESYREL) 100 MG tablet Take 200 mg by mouth daily as needed for sleep.    Yes [provider]      VITAL SIGNS:  Blood pressure 107/79, pulse 76, temperature 99.7 F (37.6 C), temperature source Oral, height 5\' 11"  (1.803 m), weight 113.4 kg (250 lb), SpO2 95 %.  PHYSICAL EXAMINATION:  GENERAL:  41 y.o.-year-old patient lying in the bed with no acute distress.  EYES: Pupils equal, round, reactive to light and accommodation. No scleral icterus. Extraocular muscles intact.  HEENT: Head atraumatic, normocephalic. Oropharynx and nasopharynx clear.  NECK:  Supple, no jugular venous distention. No thyroid enlargement, no tenderness.  LUNGS: Normal breath sounds bilaterally, no wheezing, rales,rhonchi or crepitation. No use of accessory muscles of respiration.  CARDIOVASCULAR: S1, S2 normal. No murmurs, rubs, or gallops.  ABDOMEN: Soft, Epigastric tenderness is present right upper quadrant is tender. No rebound tenderness  Bowel sounds present.  EXTREMITIES: No pedal edema, cyanosis, or clubbing.  NEUROLOGIC: Cranial nerves  II through XII are intact. Muscle strength 5/5 in all extremities. Sensation intact. Gait not checked.  PSYCHIATRIC: The patient is alert and oriented x 3.  SKIN: No obvious rash, lesion, or ulcer.   LABORATORY PANEL:   CBC  Recent Labs Lab 05/13/17 0943  WBC 6.6  HGB 17.2  HCT 39.2*  PLT 212   ------------------------------------------------------------------------------------------------------------------  Chemistries   Recent Labs Lab 05/13/17 0943  NA 128*  K 3.6  CL 95*  CO2 23  GLUCOSE 221*  BUN 11  CREATININE 0.60*  CALCIUM 8.8*  AST 23  ALT 13*  ALKPHOS 48  BILITOT 2.2*   ------------------------------------------------------------------------------------------------------------------  Cardiac Enzymes No results for input(s): TROPONINI in the last 168 hours. ------------------------------------------------------------------------------------------------------------------  RADIOLOGY:  No results found.  EKG:   Orders placed or performed during the hospital encounter of 05/13/17  . EKG 12-Lead  . EKG 12-Lead    IMPRESSION AND PLAN:  Sergio [name]  is a 41 y.o. male with a known history of Insulin requiring diabetes mellitus, hypertension, history of familial hypertriglyceridemia, gastric ulcer and  multiple other medical problems was admitted to the hospital with acute pancreatitis from hypertriglyceridemia in the past. Patient is presenting with severe epigastric abdominal pain radiating to the left upper quadrant associated with nausea and vomiting   # Acute epigastric abdominal pain secondary to acute pancreatitis from hypertriglyceridemia  admit to stepdown unit Insulin drip and D5 Aggressive hydration with IV fluids Abdominal ultrasound is ordered Follow serial triglycerides, stat triglyceride level is pending at this time Patient reports stop drinking alcohol Pain management with morphine and Dilaudid as needed. Patient reports he gets  anaphylaxis with morphine and Dilaudid but he tolerated Dilaudid in the ED, will continue the same with close monitoring   #Insulin requiring diabetes mellitus Stop insulin pump Patient is on D5 and insulin drip  #Hyponatremia secondary to hyper lipidemia It should be auto corrected with correction of triglycerides Monitor sodium levels  #Depression with anxiety Currently patient is nothing by mouth hold off on Zoloft, will resume Zoloft once patient is more stable and tolerating by mouth Will give Ativan as needed   #Noncompliance According to endocrinology Dr. Gabriel Carina patient is noncompliant, reinforced the importance of being compliant with medications and follow-up appointments   Provide GI prophylaxis with PPI  DVT prophylaxis with Lovenox subcutaneous    All the records are reviewed and case discussed with ED provider. Management plans discussed with the patient, family and they are in agreement.  CODE STATUS: FC,wife HCPOA  TOTAL CRITICAL CARE TIME TAKING CARE OF THIS PATIENT: 68minutes.   Note: This dictation was prepared with Dragon dictation along with smaller phrase technology. Any transcriptional errors that result from this process are unintentional.  [name] M.D on 05/13/2017 at 1:25 PM  Between 7am to 6pm - Pager - [phone]  After 6pm go to [email or web address] - password [email or web address] [facility]  Office  [phone]  CC: Primary care physician; Crecencio Mc, MD

## 2017-05-13 NOTE — Progress Notes (Signed)
Patient had not voided since arrival to unit. Bladder scanned for 34ml. Patient stood and was unable to void. Patient transferred to toilet per request and was able to void 478ml of dark amber urine. Patient remains nauseous and with abdominal pain. No episodes of dry heaving or emesis this shift. Patient requested to receive lyrica this night as he takes this regularly at home. NP allowed him to take this medication with a sip of water, however he remains NPO.

## 2017-05-13 NOTE — Progress Notes (Signed)
Lab called with correction to 10AM lab results.

## 2017-05-13 NOTE — ED Notes (Signed)
Pt resting, pain med given, appears in no distress at this time.

## 2017-05-13 NOTE — Consult Note (Signed)
Name: Sergio [name] MRN: [id number] DOB: [birth year]    ADMISSION DATE:  05/13/2017 CONSULTATION DATE: 05/13/2017  REFERRING MD :  Dr. Margaretmary Eddy  CHIEF COMPLAINT:  Abdominal Pain, Nausea, and Vomiting  BRIEF PATIENT DESCRIPTION:  42 year old male admitted 08/3 with acute pancreatitis secondary to hypertriglyceridemia and hyponatremia  SIGNIFICANT EVENTS  08/3-Pt admitted to the Stepdown Unit   STUDIES:  Ultrasound Abdomen Complete 08/3>>  HISTORY OF PRESENT ILLNESS:   This is a 41 year old male with a past medical history of sleep apnea uses CPAP qhs, recurrent acute pancreatitis secondary to hypertriglyceridemia, morbid obesity, hypertension, gastric ulcer, diabetes mellitus, depression, anxiety, and familial severe hypertriglyceridemia.  He presented to [facility] ER 08/3 with complaints of severe epigastric abdominal pain, nausea, and vomiting onset of symptoms 2 days prior to presentation.  He states that he has had similar symptoms in the past when dx with acute pancreatitis.  Lab results in the ER revealed lipase 246, sodium 128, triglyceride level >5,000, cholesterol 612, and ultrasound of the abdomen pending ruling pt in for acute pancreatitis.  Per ER notes ED physician discussed plan of care with endocrinologist Dr. Gabriel Carina recommendations to place patient on a continuous insulin drip and D5WNS drip.  He was subsequently admitted by the hospitalist team to the stepdown unit for further workup and treatment PCCM consulted.  PAST MEDICAL HISTORY :   has a past medical history of Anxiety; Chicken pox; Depression; Diabetes mellitus; Familial hypertriglyceridemia; Gastric ulcer (2009); HTN (hypertension); Morbid [place] ([place] Recurrent acute pancreatitis; and Sleep apnea (1999).  has a past surgical history that includes Tonsillectomy (2003); Vasectomy; Lumbar disc surgery (2008); Cholecystectomy; and Laparoscopic gastric sleeve resection. Prior to Admission medications   Medication Sig Start  Date End Date Taking? Authorizing Provider  ALPRAZolam Duanne Moron) 0.5 MG tablet Take 0.5 mg by mouth 3 (three) times daily as needed for anxiety.    Yes [provider]  finasteride (PROSCAR) 5 MG tablet Take 1 tablet (5 mg total) by mouth daily. Patient taking differently: Take 5 mg by mouth at bedtime.  01/27/16  Yes Vaughan Basta, MD  gemfibrozil (LOPID) 600 MG tablet Take 1 tablet (600 mg total) by mouth 2 (two) times daily before a meal. 08/15/15  Yes Wieting, Richard, MD  insulin aspart (NOVOLOG) 100 UNIT/ML injection Inject 1 Dose into the skin See admin instructions. Use up to 120 units daily via insulin pump 09/11/15  Yes [provider]  lipase/protease/amylase (CREON) 36000 UNITS CPEP capsule 2 tablets with meals and 1 tablet with snacks 02/03/17  Yes Wieting, Richard, MD  omega-3 acid ethyl esters (LOVAZA) 1 g capsule Take 4 g by mouth at bedtime.   Yes [provider]  oxyCODONE 10 MG TABS Take 1 tablet (10 mg total) by mouth every 6 (six) hours as needed for breakthrough pain. 02/03/17  Yes Wieting, Richard, MD  pregabalin (LYRICA) 300 MG capsule Take 300 mg by mouth at bedtime.    Yes [provider]  rosuvastatin (CRESTOR) 10 MG tablet Take 1 tablet (10 mg total) by mouth daily at 6 PM. 08/04/16  Yes Demetrios Loll, MD  sertraline (ZOLOFT) 100 MG tablet Take 1 tablet (100 mg total) by mouth daily. 12/02/16  Yes Crecencio Mc, MD  tamsulosin (FLOMAX) 0.4 MG CAPS capsule Take 1 capsule (0.4 mg total) by mouth daily. Patient taking differently: Take 0.4 mg by mouth at bedtime.  01/27/16  Yes Vaughan Basta, MD  traZODone (DESYREL) 100 MG tablet Take 200 mg by mouth daily  as needed for sleep.    Yes [provider]   Allergies  Allergen Reactions  . Codeine Anaphylaxis  . Ivp Dye [Iodinated Diagnostic Agents] Other (See Comments)    Kidneys stop working    FAMILY HISTORY:  family history includes Hyperlipidemia in his father and  mother; Hypertension in his father and mother. He was adopted. SOCIAL HISTORY:  reports that he has never smoked. He has never used smokeless tobacco. He reports that he does not drink alcohol or use drugs.  REVIEW OF SYSTEMS: Positives in BOLD    Constitutional: Negative for fever, chills, weight loss, malaise/fatigue and diaphoresis.  HENT: Negative for hearing loss, ear pain, nosebleeds, congestion, sore throat, neck pain, tinnitus and ear discharge.   Eyes: Negative for blurred vision, double vision, photophobia, pain, discharge and redness.  Respiratory: Negative for cough, hemoptysis, sputum production, shortness of breath, wheezing and stridor.   Cardiovascular: Negative for chest pain, palpitations, orthopnea, claudication, leg swelling and PND.  Gastrointestinal: heartburn, nausea, vomiting, abdominal pain, diarrhea, constipation, blood in stool and melena.  Genitourinary: Negative for dysuria, urgency, frequency, hematuria and flank pain.  Musculoskeletal: Negative for myalgias, back pain, joint pain and falls.  Skin: Negative for itching and rash.  Neurological: Negative for dizziness, tingling, tremors, sensory change, speech change, focal weakness, seizures, loss of consciousness, weakness and headaches.  Endo/Heme/Allergies: Negative for environmental allergies and polydipsia. Does not bruise/bleed easily.  SUBJECTIVE:  Still states he is having abdominal pain and nausea.  VITAL SIGNS: Temp:  [99.7 F (37.6 C)] 99.7 F (37.6 C) (08/03 0929) Pulse Rate:  [75-88] 79 (08/03 1345) BP: (106-133)/(66-99) 112/68 (08/03 1330) SpO2:  [94 %-97 %] 94 % (08/03 1345) Weight:  [113.4 kg (250 lb)] 113.4 kg (250 lb) (08/03 0919)  PHYSICAL EXAMINATION: General: well developed, well nourished Caucasian male, NAD  Neuro: alert and oriented, follows commands HEENT: supple, no JVD Cardiovascular: s1s2, rrr, no M/R/G Lungs: clear throughout, even, non labored  Abdomen: +BS x4, soft, obese,  non tender Musculoskeletal: normal bulk and tone, no edema  Skin: intact no rashes or lesions    Recent Labs Lab 05/13/17 0943  NA 128*  K 3.6  CL 95*  CO2 23  BUN 11  CREATININE 0.60*  GLUCOSE 221*    Recent Labs Lab 05/13/17 0943  HGB 17.2  HCT 39.2*  WBC 6.6  PLT 212   No results found.  ASSESSMENT / PLAN: Acute pancreatitis secondary to hypertriglyceridemia Familial hypertriglyceridemia Hyponatremia Nausea and vomiting Acute pain Hx: Insulin-dependent diabetes mellitus and OSA uses CPAP qhs P: CPAP qhs  Trend triglyceride and lipase levels Continue IV insulin until serum triglyceride level <1000 will increase rate to 10 units/hr  Will change IV fluids to D10W with titration parameters while on insulin gtt to maintain CBG readings between 80-150  CBG's q1hr for now while on insulin gtt  Hold outpatient insulin pump for now Prn Dilaudid for pain management Prn Zofran for nausea and vomiting Trend BMP Replace electrolytes as indicated Monitor urinary output  Marda Stalker, [place] Pager [phone] (please enter 7 digits) PCCM Consult Pager [phone] (please enter 7 digits)  Merton Border, MD PCCM service Mobile [phone] Pager [phone] 05/14/2017 12:43 PM

## 2017-05-13 NOTE — ED Provider Notes (Signed)
[facility] Emergency Department Provider Note       Time seen: ----------------------------------------- 9:55 AM on 05/13/2017 -----------------------------------------     I have reviewed the triage vital signs and the nursing notes.   HISTORY   Chief Complaint Abdominal Pain; Nausea; and Emesis    HPI Sergio [name] is a 41 y.o. male who presents to the ED for upper abdominal pain that began 2 days ago with nausea and vomiting. Patient reports a history of pancreatitis from high cholesterol, specifically hypertriglyceridemia. He denies fevers or chills, pain is 8 out of 10 in the upper abdomen with nausea. Nothing makes his symptoms better or worse. Patient reports taking all his medicines as prescribed.   Past Medical History:  Diagnosis Date  . Anxiety   . Chicken pox   . Depression   . Diabetes mellitus   . Familial hypertriglyceridemia    severe  . Gastric ulcer 2009  . HTN (hypertension)   . Morbid obesity ([place])    s/p bariatric sleeve surgery 01/2015  . Recurrent acute pancreatitis    secondary to hypertriglyceridemia   . Sleep apnea 1999   uses CPAP    Patient Active Problem List   Diagnosis Date Noted  . Hospital discharge follow-up 08/10/2016  . Sixth nerve palsy of left eye   . Chronic pancreatitis ([place]) 07/24/2016  . Hyponatremia 07/24/2016  . Encounter for preventive health examination 05/01/2016  . Depression, major, recurrent, moderate ([place]) 03/14/2016  . S/P bariatric surgery 02/01/2015  . S/P laparoscopic cholecystectomy 02/01/2015  . Diarrhea 05/29/2012  . Recurrent pancreatitis ([place]) 05/29/2012  . Generalized anxiety disorder 04/29/2012  . Obesity (BMI 30-39.9) 04/29/2012  . DM (diabetes mellitus), type 2, uncontrolled w/neurologic complication ([place])   . Hypertriglyceridemia   . HTN (hypertension)   . Sleep apnea     Past Surgical History:  Procedure Laterality Date  . CHOLECYSTECTOMY    . LAPAROSCOPIC  GASTRIC SLEEVE RESECTION    . [place] SURGERY  2008  . TONSILLECTOMY  2003  . VASECTOMY      Allergies Codeine and Ivp dye [iodinated diagnostic agents]  Social History Social History  Substance Use Topics  . Smoking status: Never Smoker  . Smokeless tobacco: Never Used  . Alcohol use No     Comment: once month    Review of Systems Constitutional: Negative for fever. Eyes: Negative for vision changes ENT:  Negative for congestion, sore throat Cardiovascular: Negative for chest pain. Respiratory: Negative for shortness of breath. Gastrointestinal: Positive for abdominal pain and vomiting Genitourinary: Negative for dysuria. Musculoskeletal: Negative for back pain. Skin: Negative for rash. Neurological: Negative for headaches, focal weakness or numbness.  All systems negative/normal/unremarkable except as stated in the HPI  ____________________________________________   PHYSICAL EXAM:  VITAL SIGNS: ED Triage Vitals  Enc Vitals Group     BP 05/13/17 0929 133/86     Pulse Rate 05/13/17 0929 88     Resp --      Temp 05/13/17 0929 99.7 F (37.6 C)     Temp Source 05/13/17 0929 Oral     SpO2 05/13/17 0929 97 %     Weight 05/13/17 0919 250 lb (113.4 kg)     Height 05/13/17 0919 5\' 11"  (1.803 m)     Head Circumference --      Peak Flow --      Pain Score 05/13/17 0919 8     Pain Loc --      Pain Edu? --  Excl. in [place]? --     Constitutional: Alert and oriented. Well appearing and in no distress. Eyes: Conjunctivae are normal. Normal extraocular movements. ENT   Head: Normocephalic and atraumatic.   Nose: No congestion/rhinnorhea.   Mouth/Throat: Mucous membranes are moist.   Neck: No stridor. Cardiovascular: Normal rate, regular rhythm. No murmurs, rubs, or gallops. Respiratory: Normal respiratory effort without tachypnea nor retractions. Breath sounds are clear and equal bilaterally. No wheezes/rales/rhonchi. Gastrointestinal: Epigastric  tenderness, no rebound or guarding. Normal bowel sounds. Musculoskeletal: Nontender with normal range of motion in extremities. No lower extremity tenderness nor edema. Neurologic:  Normal speech and language. No gross focal neurologic deficits are appreciated.  Skin:  Skin is warm, dry and intact. No rash noted. Psychiatric: Mood and affect are normal. Speech and behavior are normal.  ____________________________________________  EKG: Interpreted by me. Sinus rhythm rate of 94 bpm, normal PR interval, normal QRS, normal QT.  ____________________________________________  ED COURSE:  Pertinent labs & imaging results that were available during my care of the patient were reviewed by me and considered in my medical decision making (see chart for details). Patient presents for abdominal pain and vomiting, we will assess with labs and imaging as indicated.   Procedures ____________________________________________   LABS (pertinent positives/negatives)  Labs Reviewed  CBC WITH DIFFERENTIAL/PLATELET - Abnormal; Notable for the following:       Result Value   HCT 39.2 (*)    MCV 78.4 (*)    MCH 34.4 (*)    MCHC 43.8 (*)    RDW 15.8 (*)    All other components within normal limits  COMPREHENSIVE METABOLIC PANEL - Abnormal; Notable for the following:    Sodium 128 (*)    Chloride 95 (*)    Glucose, Bld 221 (*)    Creatinine, Ser 0.60 (*)    Calcium 8.8 (*)    ALT 13 (*)    Total Bilirubin 2.2 (*)    All other components within normal limits  LIPASE, BLOOD - Abnormal; Notable for the following:    Lipase 246 (*)    All other components within normal limits  TRIGLYCERIDES  CHOLESTEROL, TOTAL   ____________________________________________  FINAL ASSESSMENT AND PLAN  Pancreatitis, Hypertriglyceridemia  Plan: Patient's labs were dictated above. Patient had presented for abdominal pain which apparently is from recurrent hypertriglyceridemia-induced pancreatitis. We'll place him on  insulin drip as well as D5 normal saline. I have discussed this in conjunction with endocrinology. I will discuss with the hospitalist for admission.   Earleen Newport, MD   Note: This note was generated in part or whole with voice recognition software. Voice recognition is usually quite accurate but there are transcription errors that can and very often do occur. I apologize for any typographical errors that were not detected and corrected.     Earleen Newport, MD 05/13/17 [phone]

## 2017-05-13 NOTE — ED Triage Notes (Signed)
Pt reports upper abd pain that began 2 days ago with nausea and vomiting. Pt reports hx of pancreatitis and this feels exactly like that.

## 2017-05-13 NOTE — ED Notes (Signed)
Waiting on triglyceride level to come back, will cancel [place] after level comes back elevated.

## 2017-05-13 NOTE — Progress Notes (Signed)
eLink Physician-Brief Progress Note Patient Name: Sergio [name] DOB: 17-Nov-1975 MRN: [id number]   Date of Service  05/13/2017  HPI/Events of Note  41 year old with acute pancreatitis, hypertriglyceridemia. Labs reviewed Pt is on insulin D5 drip Stable on cam check  eICU Interventions  No eiCU interventions     Intervention Category Evaluation Type: New Patient Evaluation  Annet Manukyan 05/13/2017, 3:57 PM

## 2017-05-14 LAB — GLUCOSE, CAPILLARY
GLUCOSE-CAPILLARY: 130 mg/dL — AB (ref 65–99)
GLUCOSE-CAPILLARY: 154 mg/dL — AB (ref 65–99)
GLUCOSE-CAPILLARY: 130 mg/dL — AB (ref 65–99)
GLUCOSE-CAPILLARY: 120 mg/dL — AB (ref 65–99)
GLUCOSE-CAPILLARY: 116 mg/dL — AB (ref 65–99)
GLUCOSE-CAPILLARY: 131 mg/dL — AB (ref 65–99)
GLUCOSE-CAPILLARY: 123 mg/dL — AB (ref 65–99)
Glucose-Capillary: 120 mg/dL — ABNORMAL HIGH (ref 65–99)
Glucose-Capillary: 120 mg/dL — ABNORMAL HIGH (ref 65–99)
Glucose-Capillary: 124 mg/dL — ABNORMAL HIGH (ref 65–99)
Glucose-Capillary: 137 mg/dL — ABNORMAL HIGH (ref 65–99)
Glucose-Capillary: 138 mg/dL — ABNORMAL HIGH (ref 65–99)
Glucose-Capillary: 139 mg/dL — ABNORMAL HIGH (ref 65–99)
Glucose-Capillary: 149 mg/dL — ABNORMAL HIGH (ref 65–99)

## 2017-05-14 LAB — CBC
HCT: 36.4 % — ABNORMAL LOW (ref 40.0–52.0)
HEMOGLOBIN: 12.9 g/dL — AB (ref 13.0–18.0)
MCH: 28.5 pg (ref 26.0–34.0)
MCHC: 35.4 g/dL (ref 32.0–36.0)
MCV: 80.3 fL (ref 80.0–100.0)
PLATELETS: 178 10*3/uL (ref 150–440)
RBC: 4.53 MIL/uL (ref 4.40–5.90)
RDW: 15.5 % — ABNORMAL HIGH (ref 11.5–14.5)
WBC: 4.8 10*3/uL (ref 3.8–10.6)

## 2017-05-14 LAB — COMPREHENSIVE METABOLIC PANEL
ALBUMIN: 3.8 g/dL (ref 3.5–5.0)
ALK PHOS: 42 U/L (ref 38–126)
ALT: UNDETERMINED U/L (ref 17–63)
ANION GAP: 6 (ref 5–15)
AST: UNDETERMINED U/L (ref 15–41)
BILIRUBIN TOTAL: 3.6 mg/dL — AB (ref 0.3–1.2)
BUN: 10 mg/dL (ref 6–20)
CO2: 26 mmol/L (ref 22–32)
Calcium: 8.3 mg/dL — ABNORMAL LOW (ref 8.9–10.3)
Chloride: 97 mmol/L — ABNORMAL LOW (ref 101–111)
Creatinine, Ser: 1.13 mg/dL (ref 0.61–1.24)
GFR calc non Af Amer: >60 mL/min (ref >60)
GLUCOSE: 108 mg/dL — AB (ref 65–99)
Potassium: 5.1 mmol/L (ref 3.5–5.1)
Sodium: 129 mmol/L — ABNORMAL LOW (ref 135–145)
Total Protein: 5.1 g/dL — ABNORMAL LOW (ref 6.5–8.1)

## 2017-05-14 LAB — LIPID PANEL
CHOLESTEROL: 597 mg/dL — AB (ref 0–200)
LDL Cholesterol: UNDETERMINED mg/dL (ref 0–99)
TRIGLYCERIDES: 5038 mg/dL — AB (ref <150)
VLDL: UNDETERMINED mg/dL (ref 0–40)

## 2017-05-14 LAB — LIPASE, BLOOD: Lipase: 61 U/L — ABNORMAL HIGH (ref 11–51)

## 2017-05-14 MED ORDER — SODIUM CHLORIDE 4 MEQ/ML IV SOLN
INTRAVENOUS | Status: DC
  Administered 2017-05-14 – 2017-05-22 (×17): via INTRAVENOUS
  Filled 2017-05-14 (×30): qty 1000

## 2017-05-14 NOTE — Progress Notes (Signed)
[facility] NAME: Sergio [name]    MR#:  [id number]  DATE OF BIRTH:  August 31, 1976  SUBJECTIVE:  CHIEF COMPLAINT:  Abdominal pain is tolerable with the current pain regimen. Wants to try ice chips. Reporting nausea but denies any vomiting  REVIEW OF SYSTEMS:  CONSTITUTIONAL: No fever, fatigue or weakness.  EYES: No blurred or double vision.  EARS, NOSE, AND THROAT: No tinnitus or ear pain.  RESPIRATORY: No cough, shortness of breath, wheezing or hemoptysis.  CARDIOVASCULAR: No chest pain, orthopnea, edema.  GASTROINTESTINAL: Reports nausea, denies vomiting,. Has manageable abdominal pain with current pain regimen GENITOURINARY: No dysuria, hematuria.  ENDOCRINE: No polyuria, nocturia,  HEMATOLOGY: No anemia, easy bruising or bleeding SKIN: No rash or lesion. MUSCULOSKELETAL: No joint pain or arthritis.   NEUROLOGIC: No tingling, numbness, weakness.  PSYCHIATRY: No anxiety or depression.   DRUG ALLERGIES:   Allergies  Allergen Reactions  . Codeine Anaphylaxis  . Ivp Dye [Iodinated Diagnostic Agents] Other (See Comments)    Kidneys stop working    VITALS:  Blood pressure 117/81, pulse 68, temperature 98.5 F (36.9 C), temperature source Oral, resp. rate 14, height 5\' 11"  (1.803 m), weight 106.9 kg (235 lb 10.8 oz), SpO2 98 %.  PHYSICAL EXAMINATION:  GENERAL:  41 y.o.-year-old patient lying in the bed with no acute distress.  EYES: Pupils equal, round, reactive to light and accommodation. No scleral icterus. Extraocular muscles intact.  HEENT: Head atraumatic, normocephalic. Oropharynx and nasopharynx clear.  NECK:  Supple, no jugular venous distention. No thyroid enlargement, no tenderness.  LUNGS: Normal breath sounds bilaterally, no wheezing, rales,rhonchi or crepitation. No use of accessory muscles of respiration.  CARDIOVASCULAR: S1, S2 normal. No murmurs, rubs, or gallops.  ABDOMEN: Soft, nontender, nondistended.  Bowel sounds present.  EXTREMITIES: No pedal edema, cyanosis, or clubbing.  NEUROLOGIC: Cranial nerves II through XII are intact. Muscle strength 5/5 in all extremities. Sensation intact. Gait not checked.  PSYCHIATRIC: The patient is alert and oriented x 3.  SKIN: No obvious rash, lesion, or ulcer.    LABORATORY PANEL:   CBC  Recent Labs Lab 05/14/17 0455  WBC 4.8  HGB 12.9*  HCT 36.4*  PLT 178   ------------------------------------------------------------------------------------------------------------------  Chemistries   Recent Labs Lab 05/13/17 1448 05/14/17 0455  NA  --  129*  K  --  5.1  CL  --  97*  CO2  --  26  GLUCOSE  --  108*  BUN  --  10  CREATININE  --  1.13  CALCIUM  --  8.3*  MG 3.4*  --   AST  --  UNABLE TO REPORT DUE TO LIPEMIC INTERFERENCE  ALT  --  UNABLE TO REPORT DUE TO LIPEMIC INTERFERENCE  ALKPHOS  --  42  BILITOT  --  3.6*   ------------------------------------------------------------------------------------------------------------------  Cardiac Enzymes No results for input(s): TROPONINI in the last 168 hours. ------------------------------------------------------------------------------------------------------------------  RADIOLOGY:  No results found.  EKG:   Orders placed or performed during the hospital encounter of 05/13/17  . EKG 12-Lead  . EKG 12-Lead    ASSESSMENT AND PLAN:   Avedis Bevis  is a 41 y.o. male with a known history of Insulin requiring diabetes mellitus, hypertension, history of familial hypertriglyceridemia, gastric ulcer and multiple other medical problems was admitted to the hospital with acute pancreatitis from hypertriglyceridemia in the past. Patient is presenting with severe epigastric abdominal pain radiating to the left upper quadrant associated with nausea and  vomiting   # Acute epigastric abdominal pain secondary to acute pancreatitis from hypertriglyceridemia Continue insulin drip and D5  half-normal saline for hydration  Abdominal ultrasound is ordered, patient declined Follow serial triglycerides,  triglycerides 5038  Patitent reports stop drinking alcohol Pain management with morphine and Dilaudid as needed. Patient reports he gets anaphylaxis with morphine and Dilaudid but he tolerated Dilaudid in the ED, will continue the same with close monitoring   #Insulin requiring diabetes mellitus Stop insulin pump Continue on D5 1/2 nss and insulin drip  #Hyponatremia secondary to hyper lipidemia It should be auto corrected with correction of triglycerides Monitor sodium levels, 128-129  #Depression with anxiety Currently patient is nothing by mouth hold off on Zoloft, will resume Zoloft once patient is more stable and tolerating by mouth Will give Ativan as needed  #Obstructive sleep apnea continue CPAP daily at bedtime   #Noncompliance According to endocrinology Dr. Gabriel Carina patient is noncompliant, reinforced the importance of being compliant with medications and follow-up appointments     All the records are reviewed and case discussed with Care Management/Social Workerr. Management plans discussed with the patient, family and they are in agreement.  CODE STATUS: fc   TOTAL TIME TAKING CARE OF THIS PATIENT: 36  minutes.   POSSIBLE D/C IN 2-3  DAYS, DEPENDING ON CLINICAL CONDITION.  Note: This dictation was prepared with Dragon dictation along with smaller phrase technology. Any transcriptional errors that result from this process are unintentional.   [name] M.D on 05/14/2017 at 10:04 AM  Between 7am to 6pm - Pager - [phone] After 6pm go to [email or web address] - password [email or web address] [facility]  Office  [phone]  CC: Primary care physician; Crecencio Mc, MD

## 2017-05-15 LAB — COMPREHENSIVE METABOLIC PANEL
ALT: 24 U/L (ref 17–63)
AST: 29 U/L (ref 15–41)
Albumin: 3.6 g/dL (ref 3.5–5.0)
Alkaline Phosphatase: 46 U/L (ref 38–126)
Anion gap: 11 (ref 5–15)
BILIRUBIN TOTAL: 0.8 mg/dL (ref 0.3–1.2)
BUN: 5 mg/dL — AB (ref 6–20)
CO2: 24 mmol/L (ref 22–32)
Calcium: 8.8 mg/dL — ABNORMAL LOW (ref 8.9–10.3)
Chloride: 102 mmol/L (ref 101–111)
Creatinine, Ser: 0.92 mg/dL (ref 0.61–1.24)
Glucose, Bld: 131 mg/dL — ABNORMAL HIGH (ref 65–99)
POTASSIUM: 4.2 mmol/L (ref 3.5–5.1)
Sodium: 137 mmol/L (ref 135–145)
TOTAL PROTEIN: 7.2 g/dL (ref 6.5–8.1)

## 2017-05-15 LAB — TRIGLYCERIDES: Triglycerides: 3978 mg/dL — ABNORMAL HIGH (ref <150)

## 2017-05-15 LAB — GLUCOSE, CAPILLARY
GLUCOSE-CAPILLARY: 122 mg/dL — AB (ref 65–99)
GLUCOSE-CAPILLARY: 101 mg/dL — AB (ref 65–99)
GLUCOSE-CAPILLARY: 119 mg/dL — AB (ref 65–99)
Glucose-Capillary: 110 mg/dL — ABNORMAL HIGH (ref 65–99)
Glucose-Capillary: 111 mg/dL — ABNORMAL HIGH (ref 65–99)
Glucose-Capillary: 112 mg/dL — ABNORMAL HIGH (ref 65–99)
Glucose-Capillary: 124 mg/dL — ABNORMAL HIGH (ref 65–99)
Glucose-Capillary: 126 mg/dL — ABNORMAL HIGH (ref 65–99)
Glucose-Capillary: 128 mg/dL — ABNORMAL HIGH (ref 65–99)

## 2017-05-15 LAB — LIPASE, BLOOD: LIPASE: 35 U/L (ref 11–51)

## 2017-05-15 MED ORDER — SERTRALINE HCL 50 MG PO TABS
100.0000 mg | ORAL_TABLET | Freq: Every day | ORAL | Status: DC
  Administered 2017-05-15 – 2017-05-23 (×9): 100 mg via ORAL
  Filled 2017-05-15 (×10): qty 2

## 2017-05-15 MED ORDER — ALUM & MAG HYDROXIDE-SIMETH 200-200-20 MG/5ML PO SUSP
30.0000 mL | ORAL | Status: DC | PRN
  Administered 2017-05-15: 30 mL via ORAL
  Filled 2017-05-15 (×2): qty 30

## 2017-05-15 MED ORDER — PANTOPRAZOLE SODIUM 40 MG IV SOLR
40.0000 mg | INTRAVENOUS | Status: DC
  Administered 2017-05-15: 40 mg via INTRAVENOUS
  Filled 2017-05-15: qty 40

## 2017-05-15 NOTE — Progress Notes (Signed)
Patient reports being unable to void but feeling like bladder is very full. Patient tried standing and sitting on toilet to no success. Bladder scan with 994 mL in the bladder. Patient has had problems with urinary retention in the past as well (from both RN recollection and from patient report) which are exacerbated he reports by pain medication administration. RN reported all of this to E Link RN who relay to Margaree Mackintosh MD.

## 2017-05-15 NOTE — Progress Notes (Signed)
eLink Physician-Brief Progress Note Patient Name: Sergio [name] DOB: 1976/09/20 MRN: [id number]   Date of Service  05/15/2017  HPI/Events of Note  Oliguria - Bladder scan with 994 mL residual.  eICU Interventions  Will place Foley Catheter.      Intervention Category Intermediate Interventions: Oliguria - evaluation and management  [name] 05/15/2017, 6:27 PM

## 2017-05-15 NOTE — Progress Notes (Signed)
No new complaints. Abdominal pain is improved. Remains on insulin infusion for severe hypertriglyceridemia. Requests resumption of Zoloft.  Vitals:   05/15/17 0700 05/15/17 0800 05/15/17 0900 05/15/17 1000  BP: 113/80 (!) 105/58 94/60 111/79  Pulse: 66 66 66 69  Resp:      Temp:  (!) 97.5 F (36.4 C)    TempSrc:  Axillary    SpO2: 97% 97% 96% 98%  Weight:      Height:       NAD HEENT WNL No JVD Chest clear Reg, no M Mildly tender, +BS Ext warm, no edema  BMP Latest Ref Rng & Units 05/15/2017 05/14/2017 05/13/2017  Glucose 65 - 99 mg/dL 131(H) 108(H) 221(H)  BUN 6 - 20 mg/dL 5(L) 10 11  Creatinine 0.61 - 1.24 mg/dL 0.92 1.13 0.60(L)  BUN/Creat Ratio 8 - 19 - - -  Sodium 135 - 145 mmol/L 137 129(L) 128(L)  Potassium 3.5 - 5.1 mmol/L 4.2 5.1 3.6  Chloride 101 - 111 mmol/L 102 97(L) 95(L)  CO2 22 - 32 mmol/L 24 26 23   Calcium 8.9 - 10.3 mg/dL 8.8(L) 8.3(L) 8.8(L)   Triglycerides: 3978  CBC Latest Ref Rng & Units 05/14/2017 05/13/2017 02/03/2017  WBC 3.8 - 10.6 K/uL 4.8 6.6 5.3  Hemoglobin 13.0 - 18.0 g/dL 12.9(L) 13.9 13.1  Hematocrit 40.0 - 52.0 % 36.4(L) 39.2(L) 37.0(L)  Platelets 150 - 440 K/uL 178 212 143(L)    No new CXR  IMPRESSION: Severe hypertriglyceridemia Pancreatitis Type 2 diabetes  PLAN/REC: Continue current therapies Will remain in SDU as long as he is requiring insulin infusion  Merton Border, MD PCCM service Mobile [phone] Pager [phone] 05/15/2017 10:58 AM

## 2017-05-15 NOTE — Progress Notes (Signed)
Patient currently resting in bed. Patient slept much of the night. Patient did not want his CBG checked every hour during this shift as he was trying to rest stating that he hasn't slept over the last day. Blood sugar has remained steady each time it has been checked and patient continues to receive IV D10 1/2 NS @ 158ml/hr and IV insulin at 5 units/hr. Patient has tolerated a few ice chips. Pain remains steady in his mid-upper abdomen, receiving relief with dilaudid and zofran for nausea.

## 2017-05-15 NOTE — Progress Notes (Signed)
[place] at [place] NAME: Sergio [name]    MR#:  [id number]  DATE OF BIRTH:  May 03, 1976  SUBJECTIVE:  CHIEF COMPLAINT: Tolerating ice chips. Had a bowel movement today. Epigastric abdominal pain is 3 to 5 out of 10 Reporting nausea but denies any vomiting  REVIEW OF SYSTEMS:  CONSTITUTIONAL: No fever, fatigue or weakness.  EYES: No blurred or double vision.  EARS, NOSE, AND THROAT: No tinnitus or ear pain.  RESPIRATORY: No cough, shortness of breath, wheezing or hemoptysis.  CARDIOVASCULAR: No chest pain, orthopnea, edema.  GASTROINTESTINAL: Reports nausea, denies vomiting,. Has manageable abdominal pain with current pain regimen GENITOURINARY: No dysuria, hematuria.  ENDOCRINE: No polyuria, nocturia,  HEMATOLOGY: No anemia, easy bruising or bleeding SKIN: No rash or lesion. MUSCULOSKELETAL: No joint pain or arthritis.   NEUROLOGIC: No tingling, numbness, weakness.  PSYCHIATRY: No anxiety or depression.   DRUG ALLERGIES:   Allergies  Allergen Reactions  . Codeine Anaphylaxis  . Ivp Dye [Iodinated Diagnostic Agents] Other (See Comments)    Kidneys stop working    VITALS:  Blood pressure 111/79, pulse 69, temperature (!) 97.5 F (36.4 C), temperature source Axillary, resp. rate 14, height 5\' 11"  (1.803 m), weight 106.9 kg (235 lb 10.8 oz), SpO2 98 %.  PHYSICAL EXAMINATION:  GENERAL:  41 y.o.-year-old patient lying in the bed with no acute distress.  EYES: Pupils equal, round, reactive to light and accommodation. No scleral icterus. Extraocular muscles intact.  HEENT: Head atraumatic, normocephalic. Oropharynx and nasopharynx clear.  NECK:  Supple, no jugular venous distention. No thyroid enlargement, no tenderness.  LUNGS: Normal breath sounds bilaterally, no wheezing, rales,rhonchi or crepitation. No use of accessory muscles of respiration.  CARDIOVASCULAR: S1, S2 normal. No murmurs, rubs, or gallops.  ABDOMEN: Soft,  nontender, nondistended. Bowel sounds present.  EXTREMITIES: No pedal edema, cyanosis, or clubbing.  NEUROLOGIC: Cranial nerves II through XII are intact. Muscle strength 5/5 in all extremities. Sensation intact. Gait not checked.  PSYCHIATRIC: The patient is alert and oriented x 3.  SKIN: No obvious rash, lesion, or ulcer.    LABORATORY PANEL:   CBC  Recent Labs Lab 05/14/17 0455  WBC 4.8  HGB 12.9*  HCT 36.4*  PLT 178   ------------------------------------------------------------------------------------------------------------------  Chemistries   Recent Labs Lab 05/13/17 1448  05/15/17 0433  NA  --   < > 137  K  --   < > 4.2  CL  --   < > 102  CO2  --   < > 24  GLUCOSE  --   < > 131*  BUN  --   < > 5*  CREATININE  --   < > 0.92  CALCIUM  --   < > 8.8*  MG 3.4*  --   --   AST  --   < > 29  ALT  --   < > 24  ALKPHOS  --   < > 46  BILITOT  --   < > 0.8  < > = values in this interval not displayed. ------------------------------------------------------------------------------------------------------------------  Cardiac Enzymes No results for input(s): TROPONINI in the last 168 hours. ------------------------------------------------------------------------------------------------------------------  RADIOLOGY:  No results found.  EKG:   Orders placed or performed during the hospital encounter of 05/13/17  . EKG 12-Lead  . EKG 12-Lead    ASSESSMENT AND PLAN:   Branston Halsted  is a 41 y.o. male with a known history of Insulin requiring diabetes mellitus, hypertension, history of  familial hypertriglyceridemia, gastric ulcer and multiple other medical problems was admitted to the hospital with acute pancreatitis from hypertriglyceridemia in the past. Patient is presenting with severe epigastric abdominal pain radiating to the left upper quadrant associated with nausea and vomiting   # Acute epigastric abdominal pain secondary to acute pancreatitis from  hypertriglyceridemia Clinically stable Continue insulin drip and D5 half-normal saline for hydration  Abdominal ultrasound is ordered, patient declined Follow serial triglycerides,  triglycerides 5038 --3978 Patitent reports stop drinking alcohol Pain management with morphine and Dilaudid as needed. Patient reports he gets anaphylaxis with morphine and Dilaudid but he tolerated Dilaudid in the ED, will continue the same with close monitoring   #Insulin requiring diabetes mellitus Stop insulin pump  Continue on D5 1/2 nss and insulin drip  #Hyponatremia secondary to hyper lipidemia/dehydration Resolved Monitor sodium levels, 128-129--137  #Depression with anxiety on Zoloft Will give  Xanax as needed  #Obstructive sleep apnea continue CPAP daily at bedtime   #Noncompliance According to endocrinology Dr. Gabriel Carina patient is noncompliant, reinforced the importance of being compliant with medications and follow-up appointments     All the records are reviewed and case discussed with Care Management/Social Workerr. Management plans discussed with the patient, family and they are in agreement.  CODE STATUS: fc   TOTAL TIME TAKING CARE OF THIS PATIENT: 36  minutes.   POSSIBLE D/C IN 2-3  DAYS, DEPENDING ON CLINICAL CONDITION.  Note: This dictation was prepared with Dragon dictation along with smaller phrase technology. Any transcriptional errors that result from this process are unintentional.   Sergio [name] M.D on 05/15/2017 at 11:37 AM  Between 7am to 6pm - Pager - [phone] After 6pm go to [email or web address] - password [email or web address] [facility]  Office  [phone]  CC: Primary care physician; Crecencio Mc, MD

## 2017-05-16 DIAGNOSIS — K8581 Other acute pancreatitis with uninfected necrosis: Secondary | ICD-10-CM

## 2017-05-16 LAB — GLUCOSE, CAPILLARY
GLUCOSE-CAPILLARY: 105 mg/dL — AB (ref 65–99)
GLUCOSE-CAPILLARY: 144 mg/dL — AB (ref 65–99)
GLUCOSE-CAPILLARY: 148 mg/dL — AB (ref 65–99)
Glucose-Capillary: 141 mg/dL — ABNORMAL HIGH (ref 65–99)
Glucose-Capillary: 110 mg/dL — ABNORMAL HIGH (ref 65–99)
Glucose-Capillary: 114 mg/dL — ABNORMAL HIGH (ref 65–99)
Glucose-Capillary: 118 mg/dL — ABNORMAL HIGH (ref 65–99)

## 2017-05-16 LAB — COMPREHENSIVE METABOLIC PANEL
ALT: 24 U/L (ref 17–63)
AST: 19 U/L (ref 15–41)
Albumin: 3.6 g/dL (ref 3.5–5.0)
Alkaline Phosphatase: 52 U/L (ref 38–126)
Anion gap: 6 (ref 5–15)
CHLORIDE: 98 mmol/L — AB (ref 101–111)
CO2: 29 mmol/L (ref 22–32)
CREATININE: 0.85 mg/dL (ref 0.61–1.24)
Calcium: 8.8 mg/dL — ABNORMAL LOW (ref 8.9–10.3)
GFR calc Af Amer: >60 mL/min (ref >60)
Glucose, Bld: 139 mg/dL — ABNORMAL HIGH (ref 65–99)
Potassium: 4 mmol/L (ref 3.5–5.1)
Sodium: 133 mmol/L — ABNORMAL LOW (ref 135–145)
Total Bilirubin: 1.1 mg/dL (ref 0.3–1.2)
Total Protein: 7.1 g/dL (ref 6.5–8.1)

## 2017-05-16 LAB — TRIGLYCERIDES: TRIGLYCERIDES: 2586 mg/dL — AB (ref <150)

## 2017-05-16 LAB — LIPASE, BLOOD: Lipase: 23 U/L (ref 11–51)

## 2017-05-16 MED ORDER — GEMFIBROZIL 600 MG PO TABS
600.0000 mg | ORAL_TABLET | Freq: Two times a day (BID) | ORAL | Status: DC
  Administered 2017-05-16 – 2017-05-23 (×14): 600 mg via ORAL
  Filled 2017-05-16 (×15): qty 1

## 2017-05-16 MED ORDER — OMEGA-3-ACID ETHYL ESTERS 1 G PO CAPS
4.0000 g | ORAL_CAPSULE | Freq: Every day | ORAL | Status: DC
  Administered 2017-05-16 – 2017-05-22 (×7): 4 g via ORAL
  Filled 2017-05-16 (×7): qty 4

## 2017-05-16 MED ORDER — ROSUVASTATIN CALCIUM 10 MG PO TABS
10.0000 mg | ORAL_TABLET | Freq: Every day | ORAL | Status: DC
  Administered 2017-05-16 – 2017-05-22 (×7): 10 mg via ORAL
  Filled 2017-05-16 (×7): qty 1

## 2017-05-16 MED ORDER — TRAZODONE HCL 50 MG PO TABS: 200.0000 mg | ORAL_TABLET | Freq: Every day | ORAL | Status: DC | PRN

## 2017-05-16 MED ORDER — SODIUM CHLORIDE 0.9 % IV SOLN
INTRAVENOUS | Status: AC
  Administered 2017-05-16: 50 mL/h via INTRAVENOUS
  Administered 2017-05-17: 13:00:00 via INTRAVENOUS

## 2017-05-16 MED ORDER — PANTOPRAZOLE SODIUM 40 MG PO TBEC
40.0000 mg | DELAYED_RELEASE_TABLET | Freq: Every day | ORAL | Status: DC
  Administered 2017-05-16 – 2017-05-21 (×6): 40 mg via ORAL
  Filled 2017-05-16 (×6): qty 1

## 2017-05-16 NOTE — Progress Notes (Signed)
Patient requested to keep foley catheter in as he states that the dilaudid makes him have urinary retention. Educated patient on the indications of catheters and possible infection of keeping it in. Patient has been educated and requests to keep catheter in place. Dr Mortimer Fries aware.

## 2017-05-16 NOTE — Progress Notes (Signed)
Cc  follow-up abdominal pain   History of present illness No new complaints.  Remains on insulin infusion for severe hypertriglyceridemia. Abdominal discomfort much improved  Vitals:   05/16/17 0400 05/16/17 0500 05/16/17 0600 05/16/17 0700  BP: 123/87 115/77 (!) 142/91 (!) 132/102  Pulse: 69 62 68 72  Resp:      Temp:    (!) 97.5 F (36.4 C)  TempSrc:      SpO2: 98% 95% 98% 97%  Weight:      Height:       NAD HEENT WNL No JVD Chest clear Reg, no M Mildly tender, +BS Ext warm, no edema    Triglycerides: 3978-->2586   No new CXR  IMPRESSION: Severe hypertriglyceridemia Pancreatitis Type 2 diabetes  PLAN/REC: Continue current therapies Will remain in SDU as long as he is requiring insulin infusion    Corrin Parker, M.D.  [facility]  Medical Director [place] Director [place] Department

## 2017-05-16 NOTE — Progress Notes (Signed)
Good urinary output with Foley. Continues to complain of abdominal pain. Medicated with Dilaudid almost every three hours. Patient calls for medication often. Up most of the night. Wore cpap for some time. Gets out of bed to commode, and flushes. Won't let staff see BM.

## 2017-05-16 NOTE — Progress Notes (Signed)
[name] NAME: Sergio [name]    MR#:  [id number]  DATE OF BIRTH:  1976/07/12  SUBJECTIVE:  CHIEF COMPLAINT: Tolerating ice chips. Abdominal pain is improving wants to try clear liquids   REVIEW OF SYSTEMS:  CONSTITUTIONAL: No fever, fatigue or weakness.  EYES: No blurred or double vision.  EARS, NOSE, AND THROAT: No tinnitus or ear pain.  RESPIRATORY: No cough, shortness of breath, wheezing or hemoptysis.  CARDIOVASCULAR: No chest pain, orthopnea, edema.  GASTROINTESTINAL:denies vomiting,. Epigastric abdominal pain is improving  HEMATOLOGY: No anemia, easy bruising or bleeding SKIN: No rash or lesion. MUSCULOSKELETAL: No joint pain or arthritis.   NEUROLOGIC: No tingling, numbness, weakness.  PSYCHIATRY: No anxiety or depression.   DRUG ALLERGIES:   Allergies  Allergen Reactions  . Codeine Anaphylaxis  . Ivp Dye [Iodinated Diagnostic Agents] Other (See Comments)    Kidneys stop working    VITALS:  Blood pressure 133/82, pulse 65, temperature (!) 97.5 F (36.4 C), resp. rate 14, height 5\' 11"  (1.803 m), weight 106.9 kg (235 lb 10.8 oz), SpO2 98 %.  PHYSICAL EXAMINATION:  GENERAL:  41 y.o.-year-old patient lying in the bed with no acute distress.  EYES: Pupils equal, round, reactive to light and accommodation. No scleral icterus. Extraocular muscles intact.  HEENT: Head atraumatic, normocephalic. Oropharynx and nasopharynx clear.  NECK:  Supple, no jugular venous distention. No thyroid enlargement, no tenderness.  LUNGS: Normal breath sounds bilaterally, no wheezing, rales,rhonchi or crepitation. No use of accessory muscles of respiration.  CARDIOVASCULAR: S1, S2 normal. No murmurs, rubs, or gallops.  ABDOMEN: Soft, nontender, nondistended. Bowel sounds present.  EXTREMITIES: No pedal edema, cyanosis, or clubbing.  NEUROLOGIC: Cranial nerves II through XII are intact. Muscle strength 5/5 in all extremities.  Sensation intact. Gait not checked.  PSYCHIATRIC: The patient is alert and oriented x 3.  SKIN: No obvious rash, lesion, or ulcer.    LABORATORY PANEL:   CBC  Recent Labs Lab 05/14/17 0455  WBC 4.8  HGB 12.9*  HCT 36.4*  PLT 178   ------------------------------------------------------------------------------------------------------------------  Chemistries   Recent Labs Lab 05/13/17 1448  05/16/17 0342  NA  --   < > 133*  K  --   < > 4.0  CL  --   < > 98*  CO2  --   < > 29  GLUCOSE  --   < > 139*  BUN  --   < > <5*  CREATININE  --   < > 0.85  CALCIUM  --   < > 8.8*  MG 3.4*  --   --   AST  --   < > 19  ALT  --   < > 24  ALKPHOS  --   < > 52  BILITOT  --   < > 1.1  < > = values in this interval not displayed. ------------------------------------------------------------------------------------------------------------------  Cardiac Enzymes No results for input(s): TROPONINI in the last 168 hours. ------------------------------------------------------------------------------------------------------------------  RADIOLOGY:  No results found.  EKG:   Orders placed or performed during the hospital encounter of 05/13/17  . EKG 12-Lead  . EKG 12-Lead    ASSESSMENT AND PLAN:   Sergio [name]  is a 41 y.o. male with a known history of Insulin requiring diabetes mellitus, hypertension, history of familial hypertriglyceridemia, gastric ulcer and multiple other medical problems was admitted to the hospital with acute pancreatitis from hypertriglyceridemia in the past. Patient is presenting with severe epigastric abdominal  pain radiating to the left upper quadrant associated with nausea and vomiting   # Acute epigastric abdominal pain secondary to acute pancreatitis from hypertriglyceridemia Clinically  Improving Start clear liquid diet Continue insulin drip and D5 half-normal saline for hydration  Abdominal ultrasound is ordered, patient declined Follow serial  triglycerides,  triglycerides 5038 --[id number] Patitent reports he is not drinking alcohol Pain management with morphine and Dilaudid as needed. Patient reports he gets anaphylaxis with morphine and Dilaudid but he tolerated Dilaudid in the ED, will continue the same with close monitoring resume home medication Crestor, Lopid and Lovaza   #Insulin requiring diabetes mellitus Stop insulin pump  Continue on D5 1/2 nss and insulin drip  #Hyponatremia secondary to hyper lipidemia/dehydration Resolved Monitor sodium levels, 128-129--137-133   #Depression with anxiety on Zoloft Will give  Xanax as needed  #Obstructive sleep apnea continue CPAP daily at bedtime   #Noncompliance According to endocrinology Dr. Gabriel Carina patient is noncompliant, reinforced the importance of being compliant with medications and follow-up appointments     All the records are reviewed and case discussed with Care Management/Social Workerr. Management plans discussed with the patient, family and they are in agreement.  CODE STATUS: fc   TOTAL TIME TAKING CARE OF THIS PATIENT: 36  minutes.   POSSIBLE D/C IN 2-3  DAYS, DEPENDING ON CLINICAL CONDITION.  Note: This dictation was prepared with Dragon dictation along with smaller phrase technology. Any transcriptional errors that result from this process are unintentional.   Sergio [name] M.D on 05/16/2017 at 3:12 PM  Between 7am to 6pm - Pager - [phone] After 6pm go to [email or web address] - password [email or web address] [facility]  Office  [phone]  CC: Primary care physician; Crecencio Mc, MD

## 2017-05-17 DIAGNOSIS — K859 Acute pancreatitis without necrosis or infection, unspecified: Secondary | ICD-10-CM

## 2017-05-17 LAB — COMPREHENSIVE METABOLIC PANEL
ALBUMIN: 4 g/dL (ref 3.5–5.0)
ALT: 24 U/L (ref 17–63)
ALT: 25 U/L (ref 17–63)
ANION GAP: 7 (ref 5–15)
AST: 20 U/L (ref 15–41)
AST: 24 U/L (ref 15–41)
Albumin: 3.6 g/dL (ref 3.5–5.0)
Alkaline Phosphatase: 61 U/L (ref 38–126)
Alkaline Phosphatase: 55 U/L (ref 38–126)
Anion gap: 9 (ref 5–15)
BUN: <5 mg/dL — ABNORMAL LOW (ref 6–20)
CHLORIDE: 98 mmol/L — AB (ref 101–111)
CHLORIDE: 97 mmol/L — AB (ref 101–111)
CO2: 32 mmol/L (ref 22–32)
CO2: 30 mmol/L (ref 22–32)
CREATININE: 0.71 mg/dL (ref 0.61–1.24)
Calcium: 8.8 mg/dL — ABNORMAL LOW (ref 8.9–10.3)
Calcium: 8.5 mg/dL — ABNORMAL LOW (ref 8.9–10.3)
Creatinine, Ser: 0.87 mg/dL (ref 0.61–1.24)
GFR calc Af Amer: >60 mL/min (ref >60)
GFR calc non Af Amer: >60 mL/min (ref >60)
GLUCOSE: 102 mg/dL — AB (ref 65–99)
Glucose, Bld: 142 mg/dL — ABNORMAL HIGH (ref 65–99)
POTASSIUM: 3.8 mmol/L (ref 3.5–5.1)
POTASSIUM: 3.4 mmol/L — AB (ref 3.5–5.1)
SODIUM: 134 mmol/L — AB (ref 135–145)
Sodium: 139 mmol/L (ref 135–145)
Total Bilirubin: 0.9 mg/dL (ref 0.3–1.2)
Total Bilirubin: 0.6 mg/dL (ref 0.3–1.2)
Total Protein: 7.6 g/dL (ref 6.5–8.1)
Total Protein: 7.2 g/dL (ref 6.5–8.1)

## 2017-05-17 LAB — GLUCOSE, CAPILLARY
GLUCOSE-CAPILLARY: 99 mg/dL (ref 65–99)
GLUCOSE-CAPILLARY: 96 mg/dL (ref 65–99)
GLUCOSE-CAPILLARY: 214 mg/dL — AB (ref 65–99)
GLUCOSE-CAPILLARY: 139 mg/dL — AB (ref 65–99)
GLUCOSE-CAPILLARY: 110 mg/dL — AB (ref 65–99)
GLUCOSE-CAPILLARY: 126 mg/dL — AB (ref 65–99)
GLUCOSE-CAPILLARY: 136 mg/dL — AB (ref 65–99)
GLUCOSE-CAPILLARY: 164 mg/dL — AB (ref 65–99)
GLUCOSE-CAPILLARY: 149 mg/dL — AB (ref 65–99)
Glucose-Capillary: 108 mg/dL — ABNORMAL HIGH (ref 65–99)
Glucose-Capillary: 120 mg/dL — ABNORMAL HIGH (ref 65–99)
Glucose-Capillary: 187 mg/dL — ABNORMAL HIGH (ref 65–99)
Glucose-Capillary: 68 mg/dL (ref 65–99)

## 2017-05-17 LAB — TRIGLYCERIDES: TRIGLYCERIDES: 1815 mg/dL — AB (ref <150)

## 2017-05-17 LAB — LIPASE, BLOOD: LIPASE: 23 U/L (ref 11–51)

## 2017-05-17 MED ORDER — DEXTROSE 50 % IV SOLN
1.0000 | Freq: Once | INTRAVENOUS | Status: AC
  Administered 2017-05-17: 50 mL via INTRAVENOUS
  Filled 2017-05-17: qty 50

## 2017-05-17 MED ORDER — OXYCODONE HCL 5 MG PO TABS
10.0000 mg | ORAL_TABLET | Freq: Four times a day (QID) | ORAL | Status: DC | PRN
  Administered 2017-05-17 – 2017-05-22 (×7): 10 mg via ORAL
  Filled 2017-05-17 (×8): qty 2

## 2017-05-17 MED ORDER — ACETAMINOPHEN 325 MG PO TABS
650.0000 mg | ORAL_TABLET | ORAL | Status: DC | PRN
  Administered 2017-05-17: 650 mg via ORAL
  Filled 2017-05-17: qty 2

## 2017-05-17 NOTE — Progress Notes (Signed)
Cc  follow-up abdominal pain   History of present illness No new complaints.  Remains on insulin infusion for severe hypertriglyceridemia. Abdominal discomfort much improved but still there On nasal CPAP for OSA  Vitals:   05/17/17 0300 05/17/17 0400 05/17/17 0500 05/17/17 0600  BP: 125/82 (!) 119/91 (!) 128/94 (!) 129/95  Pulse: 60 (!) 58 65 64  Resp:      Temp:      TempSrc:      SpO2: 98% 97% 97% 98%  Weight:      Height:       NAD HEENT WNL No JVD Chest clear Reg, no M Mildly tender, +BS Ext warm, no edema    Triglycerides: 3978-->2586-->1815   No new CXR  IMPRESSION: Severe hypertriglyceridemia Pancreatitis Type 2 diabetes  PLAN/REC: Continue current therapies Will remain in SDU as long as he is requiring insulin infusion    Corrin Parker, M.D.  [facility]  Medical Director [place] Director [place] Department

## 2017-05-17 NOTE — Progress Notes (Signed)
Spoke with patient regarding the removal of catheter. Educated him on the indications and possible infections associated with catheter. At this point patient stated he wanted to keep catheter. Dr Margaretmary Eddy and [facility] aware.

## 2017-05-17 NOTE — Progress Notes (Signed)
Writer notes insulin gtt ordered for 10 units/hour.  Per the [facility] the gtt has been infusing at 5 units/hour since it was started.  No notes are found indicating orders to infuse at < ordered rate. Will address with intensivist when he rounds.  Patient's CBGs are WDL.

## 2017-05-17 NOTE — Progress Notes (Signed)
[place] at [place] NAME: Sergio [name]    MR#:  [id number]  DATE OF BIRTH:  September 25, 1976  SUBJECTIVE:  CHIEF COMPLAINT: pt couldn't tolerate ice chips.   REVIEW OF SYSTEMS:  CONSTITUTIONAL: No fever, fatigue or weakness.  EYES: No blurred or double vision.  EARS, NOSE, AND THROAT: No tinnitus or ear pain.  RESPIRATORY: No cough, shortness of breath, wheezing or hemoptysis.  CARDIOVASCULAR: No chest pain, orthopnea, edema.  GASTROINTESTINAL:denies vomiting,. Epigastric abdominal pain is improving  HEMATOLOGY: No anemia, easy bruising or bleeding SKIN: No rash or lesion. MUSCULOSKELETAL: No joint pain or arthritis.   NEUROLOGIC: No tingling, numbness, weakness.  PSYCHIATRY: No anxiety or depression.   DRUG ALLERGIES:   Allergies  Allergen Reactions  . Codeine Anaphylaxis  . Ivp Dye [Iodinated Diagnostic Agents] Other (See Comments)    Kidneys stop working    VITALS:  Blood pressure (!) 129/91, pulse 64, temperature 98.2 F (36.8 C), temperature source Oral, resp. rate 14, height 5\' 11"  (1.803 m), weight 106.9 kg (235 lb 10.8 oz), SpO2 93 %.  PHYSICAL EXAMINATION:  GENERAL:  41 y.o.-year-old patient lying in the bed with no acute distress.  EYES: Pupils equal, round, reactive to light and accommodation. No scleral icterus. Extraocular muscles intact.  HEENT: Head atraumatic, normocephalic. Oropharynx and nasopharynx clear.  NECK:  Supple, no jugular venous distention. No thyroid enlargement, no tenderness.  LUNGS: Normal breath sounds bilaterally, no wheezing, rales,rhonchi or crepitation. No use of accessory muscles of respiration.  CARDIOVASCULAR: S1, S2 normal. No murmurs, rubs, or gallops.  ABDOMEN: Soft, nontender, nondistended. Bowel sounds present.  EXTREMITIES: No pedal edema, cyanosis, or clubbing.  NEUROLOGIC: Cranial nerves II through XII are intact. Muscle strength 5/5 in all extremities. Sensation intact. Gait not  checked.  PSYCHIATRIC: The patient is alert and oriented x 3.  SKIN: No obvious rash, lesion, or ulcer.    LABORATORY PANEL:   CBC  Recent Labs Lab 05/14/17 0455  WBC 4.8  HGB 12.9*  HCT 36.4*  PLT 178   ------------------------------------------------------------------------------------------------------------------  Chemistries   Recent Labs Lab 05/13/17 1448  05/17/17 0511  NA  --   < > 139  K  --   < > 3.8  CL  --   < > 98*  CO2  --   < > 32  GLUCOSE  --   < > 102*  BUN  --   < > <5*  CREATININE  --   < > 0.87  CALCIUM  --   < > 8.8*  MG 3.4*  --   --   AST  --   < > 20  ALT  --   < > 24  ALKPHOS  --   < > 61  BILITOT  --   < > 0.9  < > = values in this interval not displayed. ------------------------------------------------------------------------------------------------------------------  Cardiac Enzymes No results for input(s): TROPONINI in the last 168 hours. ------------------------------------------------------------------------------------------------------------------  RADIOLOGY:  No results found.  EKG:   Orders placed or performed during the hospital encounter of 05/13/17  . EKG 12-Lead  . EKG 12-Lead    ASSESSMENT AND PLAN:   Sergio [name]  is a 41 y.o. male with a known history of Insulin requiring diabetes mellitus, hypertension, history of familial hypertriglyceridemia, gastric ulcer and multiple other medical problems was admitted to the hospital with acute pancreatitis from hypertriglyceridemia in the past. Patient is presenting with severe epigastric abdominal pain radiating to the  left upper quadrant associated with nausea and vomiting   # Acute epigastric abdominal pain secondary to acute pancreatitis from hypertriglyceridemia Clinically  Improving Npo, couldn't tolerate clears  Continue insulin drip and D5 half-normal saline for hydration  Abdominal ultrasound is ordered, patient declined Follow serial triglycerides,   triglycerides 5038 --[phone] Patitent reports he is not drinking alcohol Pain management with morphine and Dilaudid as needed. resume home medication Crestor, Lopid and Lovaza   #Insulin requiring diabetes mellitus Stop insulin pump - home med Continue on D5 1/2 nss and insulin drip  #Hyponatremia secondary to hyper lipidemia/dehydration Resolved Monitor sodium levels, 128-129--137-133   #Depression with anxiety on Zoloft Will give  Xanax as needed  #Obstructive sleep apnea continue CPAP daily at bedtime   #Noncompliance According to endocrinology Dr. Gabriel Carina patient is noncompliant, reinforced the importance of being compliant with medications and follow-up appointments     All the records are reviewed and case discussed with Care Management/Social Workerr. Management plans discussed with the patient, family and they are in agreement.  CODE STATUS: fc   TOTAL TIME TAKING CARE OF THIS PATIENT: 36  minutes.   POSSIBLE D/C IN 2-3  DAYS, DEPENDING ON CLINICAL CONDITION.  Note: This dictation was prepared with Dragon dictation along with smaller phrase technology. Any transcriptional errors that result from this process are unintentional.   [name] M.D on 05/17/2017 at 10:40 AM  Between 7am to 6pm - Pager - [phone] After 6pm go to [email or web address] - password [email or web address] [facility]  Office  [phone]  CC: Primary care physician; Crecencio Mc, MD

## 2017-05-17 NOTE — Progress Notes (Signed)
Per Dr Alva Garnet order APAP for HA, mild pain

## 2017-05-17 NOTE — Progress Notes (Signed)
Spoke with Pharmacist regarding patient taking oxycodone 10 mg at home and allergies his allergy to codiene. Patient states that he does not have an allergy to oxycodone and he does not have a reaction to it. Per Pharmacist can order.

## 2017-05-17 NOTE — Progress Notes (Signed)
Per Felecia RN (patients nurse) finger sticks q4. RN Solmon Ice discussed this with maggie NP. Patient had been refusing frequent blood sugar checks. He currently is on D10, insulin drip and his home insulin pump is turned off.

## 2017-05-18 LAB — GLUCOSE, CAPILLARY
GLUCOSE-CAPILLARY: 113 mg/dL — AB (ref 65–99)
GLUCOSE-CAPILLARY: 165 mg/dL — AB (ref 65–99)
GLUCOSE-CAPILLARY: 174 mg/dL — AB (ref 65–99)
GLUCOSE-CAPILLARY: 217 mg/dL — AB (ref 65–99)
Glucose-Capillary: 171 mg/dL — ABNORMAL HIGH (ref 65–99)
Glucose-Capillary: 139 mg/dL — ABNORMAL HIGH (ref 65–99)
Glucose-Capillary: 128 mg/dL — ABNORMAL HIGH (ref 65–99)
Glucose-Capillary: 157 mg/dL — ABNORMAL HIGH (ref 65–99)
Glucose-Capillary: 191 mg/dL — ABNORMAL HIGH (ref 65–99)
Glucose-Capillary: 197 mg/dL — ABNORMAL HIGH (ref 65–99)

## 2017-05-18 LAB — COMPREHENSIVE METABOLIC PANEL
ALK PHOS: 57 U/L (ref 38–126)
ALT: 23 U/L (ref 17–63)
ANION GAP: 10 (ref 5–15)
AST: 21 U/L (ref 15–41)
Albumin: 3.8 g/dL (ref 3.5–5.0)
BILIRUBIN TOTAL: 0.7 mg/dL (ref 0.3–1.2)
BUN: 6 mg/dL (ref 6–20)
CALCIUM: 8.5 mg/dL — AB (ref 8.9–10.3)
CO2: 28 mmol/L (ref 22–32)
CREATININE: 0.83 mg/dL (ref 0.61–1.24)
Chloride: 99 mmol/L — ABNORMAL LOW (ref 101–111)
Glucose, Bld: 148 mg/dL — ABNORMAL HIGH (ref 65–99)
Potassium: 3.3 mmol/L — ABNORMAL LOW (ref 3.5–5.1)
SODIUM: 137 mmol/L (ref 135–145)
TOTAL PROTEIN: 7.2 g/dL (ref 6.5–8.1)

## 2017-05-18 LAB — LIPASE, BLOOD: LIPASE: 25 U/L (ref 11–51)

## 2017-05-18 LAB — TRIGLYCERIDES: TRIGLYCERIDES: 1308 mg/dL — AB (ref <150)

## 2017-05-18 MED ORDER — POTASSIUM CHLORIDE CRYS ER 20 MEQ PO TBCR
40.0000 meq | EXTENDED_RELEASE_TABLET | Freq: Two times a day (BID) | ORAL | Status: AC
  Administered 2017-05-18 (×2): 40 meq via ORAL
  Filled 2017-05-18 (×2): qty 2

## 2017-05-18 NOTE — Progress Notes (Signed)
[place] at [employer] NAME: Sergio [name]    MR#:  [id number]  DATE OF BIRTH:  [birth year]  SUBJECTIVE:  CHIEF COMPLAINT: The patient feels better today, less abdominal pain, now on a heart healthy diet. 36 level is 1300. Patient is being continued on insulin IV drip.   REVIEW OF SYSTEMS:  CONSTITUTIONAL: No fever, fatigue or weakness.  EYES: No blurred or double vision.  EARS, NOSE, AND THROAT: No tinnitus or ear pain.  RESPIRATORY: No cough, shortness of breath, wheezing or hemoptysis.  CARDIOVASCULAR: No chest pain, orthopnea, edema.  GASTROINTESTINAL:denies vomiting,. Epigastric abdominal pain is improving  HEMATOLOGY: No anemia, easy bruising or bleeding SKIN: No rash or lesion. MUSCULOSKELETAL: No joint pain or arthritis.   NEUROLOGIC: No tingling, numbness, weakness.  PSYCHIATRY: No anxiety or depression.   DRUG ALLERGIES:   Allergies  Allergen Reactions  . Codeine Anaphylaxis  . Ivp Dye [Iodinated Diagnostic Agents] Other (See Comments)    Kidneys stop working    VITALS:  Blood pressure (!) 132/91, pulse 70, temperature 98 F (36.7 C), temperature source Oral, resp. rate 14, height 5\' 11"  (1.803 m), weight 106.9 kg (235 lb 10.8 oz), SpO2 96 %.  PHYSICAL EXAMINATION:  GENERAL:  41 y.o.-year-old patient lying in the bed with no acute distress.  EYES: Pupils equal, round, reactive to light and accommodation. No scleral icterus. Extraocular muscles intact.  HEENT: Head atraumatic, normocephalic. Oropharynx and nasopharynx clear.  NECK:  Supple, no jugular venous distention. No thyroid enlargement, no tenderness.  LUNGS: Normal breath sounds bilaterally, no wheezing, rales,rhonchi or crepitation. No use of accessory muscles of respiration.  CARDIOVASCULAR: S1, S2 normal. No murmurs, rubs, or gallops.  ABDOMEN: Soft, Mild discomfort in epigastric area, no rebound or guarding, nondistended. Bowel sounds present.  EXTREMITIES:  No pedal edema, cyanosis, or clubbing.  NEUROLOGIC: Cranial nerves II through XII are intact. Muscle strength 5/5 in all extremities. Sensation intact. Gait not checked.  PSYCHIATRIC: The patient is alert and oriented x 3.  SKIN: No obvious rash, lesion, or ulcer.    LABORATORY PANEL:   CBC  Recent Labs Lab 05/14/17 0455  WBC 4.8  HGB 12.9*  HCT 36.4*  PLT 178   ------------------------------------------------------------------------------------------------------------------  Chemistries   Recent Labs Lab 05/13/17 1448  05/18/17 0304  NA  --   < > 137  K  --   < > 3.3*  CL  --   < > 99*  CO2  --   < > 28  GLUCOSE  --   < > 148*  BUN  --   < > 6  CREATININE  --   < > 0.83  CALCIUM  --   < > 8.5*  MG 3.4*  --   --   AST  --   < > 21  ALT  --   < > 23  ALKPHOS  --   < > 57  BILITOT  --   < > 0.7  < > = values in this interval not displayed. ------------------------------------------------------------------------------------------------------------------  Cardiac Enzymes No results for input(s): TROPONINI in the last 168 hours. ------------------------------------------------------------------------------------------------------------------  RADIOLOGY:  No results found.  EKG:   Orders placed or performed during the hospital encounter of 05/13/17  . EKG 12-Lead  . EKG 12-Lead    ASSESSMENT AND PLAN:   Veryl Winemiller  is a 41 y.o. male with a known history of Insulin requiring diabetes mellitus, hypertension, history of familial hypertriglyceridemia, gastric ulcer  and multiple other medical problems was admitted to the hospital with acute pancreatitis from hypertriglyceridemia in the past. Patient is presenting with severe epigastric abdominal pain radiating to the left upper quadrant associated with nausea and vomiting   # Acute epigastric abdominal pain secondary to acute pancreatitis from hypertriglyceridemia Clinically  Improving Now on heart healthy  diet Continue insulin drip and D5 half-normal saline for hydration  Abdominal ultrasound is ordered, patient declined Follow serial triglycerides,  triglycerides 5038 --[id number] Patitent reports he does not drink alcohol Pain management with morphine and Dilaudid as needed. resume home medication Crestor, Lopid and Lovaza upon discharge   #Insulin requiring diabetes mellitus Stop insulin pump - home med Continue on D5 1/2 nss and insulin drip, blood glucose levels are ranging between 113-170, triglyceride level is improving  #Hyponatremia secondary to hyper lipidemia/dehydration Resolved Monitor sodium levels, 128-129--137-133-137   #Depression with anxiety on Zoloft Will give  Xanax as needed  #Obstructive sleep apnea continue CPAP daily at bedtime   #Noncompliance According to endocrinology Dr. Gabriel Carina patient is noncompliant, reinforced the importance of being compliant with medications and follow-up appointments, likely discharge tomorrow, if triglyceride level is below 1000 with follow-up with Dr. Gabriel Carina as outpatient     All the records are reviewed and case discussed with Care Management/Social Workerr. Management plans discussed with the patient, family and they are in agreement.  CODE STATUS: fc   TOTAL TIME TAKING CARE OF THIS PATIENT: 35  minutes.   POSSIBLE D/C IN 2-3  DAYS, DEPENDING ON CLINICAL CONDITION.  Note: This dictation was prepared with Dragon dictation along with smaller phrase technology. Any transcriptional errors that result from this process are unintentional.   Marvetta Vohs M.D on 05/18/2017 at 12:42 PM  Between 7am to 6pm - Pager - [phone] After 6pm go to [email or web address] - password [email or web address] [facility]  Office  [phone]  CC: Primary care physician; Crecencio Mc, MD

## 2017-05-18 NOTE — Progress Notes (Signed)
Late entry:  Received report from dayshift nurse and inquired how often FBS are to be done. RN stated they have been doing them q 4. Will monitor.

## 2017-05-18 NOTE — Progress Notes (Signed)
Chaplain was making his rounds and visited with pt in [place]. Chaplain provided [employer] of prayer and a pastoral presence.    05/18/17 1235  Clinical Encounter Type  Visited With Patient  Visit Type Initial;Spiritual support  Referral From Nurse  Consult/Referral To Chaplain  Spiritual Encounters  Spiritual Needs Prayer

## 2017-05-18 NOTE — Progress Notes (Signed)
Cc  follow-up abdominal pain pancreatitis   History of present illness No new complaints.  Remains on insulin infusion for severe hypertriglyceridemia. Abdominal discomfort much improved but still there On nasal CPAP for OSA Asking for pain meds  Vitals:   05/17/17 2200 05/18/17 0200 05/18/17 0400 05/18/17 0600  BP: 130/89 117/80 115/76 110/76  Pulse: 68 62 (!) 59 66  Resp:      Temp:  97.8 F (36.6 C)    TempSrc:  Oral    SpO2: 93% 97% 98% 97%  Weight:      Height:       NAD HEENT WNL No JVD Chest clear Reg, no M Mildly tender, +BS Ext warm, no edema    Triglycerides: 3978-->2586-->1815-->1300   No new CXR  IMPRESSION: Severe hypertriglyceridemia Pancreatitis Type 2 diabetes  PLAN/REC: Continue current therapies Will remain in SDU as long as he is requiring insulin infusion    Corrin Parker, M.D.  [facility]  Medical Director [place] Director [place] [facility]

## 2017-05-19 DIAGNOSIS — E781 Pure hyperglyceridemia: Secondary | ICD-10-CM

## 2017-05-19 DIAGNOSIS — K8500 Idiopathic acute pancreatitis without necrosis or infection: Secondary | ICD-10-CM

## 2017-05-19 LAB — GLUCOSE, CAPILLARY
GLUCOSE-CAPILLARY: 250 mg/dL — AB (ref 65–99)
GLUCOSE-CAPILLARY: 179 mg/dL — AB (ref 65–99)
GLUCOSE-CAPILLARY: 231 mg/dL — AB (ref 65–99)
GLUCOSE-CAPILLARY: 191 mg/dL — AB (ref 65–99)
GLUCOSE-CAPILLARY: 180 mg/dL — AB (ref 65–99)
GLUCOSE-CAPILLARY: 196 mg/dL — AB (ref 65–99)
Glucose-Capillary: 156 mg/dL — ABNORMAL HIGH (ref 65–99)
Glucose-Capillary: 173 mg/dL — ABNORMAL HIGH (ref 65–99)
Glucose-Capillary: 197 mg/dL — ABNORMAL HIGH (ref 65–99)
Glucose-Capillary: 206 mg/dL — ABNORMAL HIGH (ref 65–99)
Glucose-Capillary: 226 mg/dL — ABNORMAL HIGH (ref 65–99)
Glucose-Capillary: 263 mg/dL — ABNORMAL HIGH (ref 65–99)

## 2017-05-19 LAB — TRIGLYCERIDES: Triglycerides: 2443 mg/dL — ABNORMAL HIGH (ref <150)

## 2017-05-19 MED ORDER — SODIUM CHLORIDE 0.9 % IV SOLN
INTRAVENOUS | Status: DC
  Administered 2017-05-19 – 2017-05-22 (×5): 8 [IU]/h via INTRAVENOUS
  Filled 2017-05-19 (×5): qty 1

## 2017-05-19 NOTE — Progress Notes (Signed)
Cc  follow-up abdominal pain pancreatitis   History of present illness No new complaints.  Remains on insulin infusion for severe hypertriglyceridemia. Abdominal discomfort much improved but still there On nasal CPAP for OSA Asking for pain meds  Vitals:   05/18/17 1900 05/18/17 2000 05/19/17 0648 05/19/17 0700  BP:  (!) 132/91 133/89 (!) 123/91  Pulse: 66 67 66 61  Resp:   15 (!) 9  Temp:      TempSrc:      SpO2: 93% 93% 97% 98%  Weight:      Height:       NAD HEENT WNL No JVD Chest clear Reg, no M Mildly tender, +BS Ext warm, no edema    Triglycerides: 3978-->2586-->1815-->1300-->2400   No new CXR  IMPRESSION: Severe hypertriglyceridemia Pancreatitis Type 2 diabetes  PLAN/REC: Continue current therapies Will remain in SDU as long as he is requiring insulin infusion Will need to increase insulin drip infusion will need to discuss with pharmacy    Corrin Parker, M.D.  [facility]  Medical Director [place] Director [place] [facility]

## 2017-05-19 NOTE — Progress Notes (Signed)
[place] at [place] NAME: Sergio [name]    MR#:  [id number]  DATE OF BIRTH:  [birth year]  SUBJECTIVE:  CHIEF COMPLAINT: The patient feels Satisfactory today, some after meals abdominal pain, now on a heart healthy diet. Triglycerides level is 2400, . Patient is being continued on insulin IV drip, advanced dose.   REVIEW OF SYSTEMS:  CONSTITUTIONAL: No fever, fatigue or weakness.  EYES: No blurred or double vision.  EARS, NOSE, AND THROAT: No tinnitus or ear pain.  RESPIRATORY: No cough, shortness of breath, wheezing or hemoptysis.  CARDIOVASCULAR: No chest pain, orthopnea, edema.  GASTROINTESTINAL:denies vomiting,. Epigastric abdominal pain is improving  HEMATOLOGY: No anemia, easy bruising or bleeding SKIN: No rash or lesion. MUSCULOSKELETAL: No joint pain or arthritis.   NEUROLOGIC: No tingling, numbness, weakness.  PSYCHIATRY: No anxiety or depression.   DRUG ALLERGIES:   Allergies  Allergen Reactions  . Codeine Anaphylaxis  . Ivp Dye [Iodinated Diagnostic Agents] Other (See Comments)    Kidneys stop working    VITALS:  Blood pressure 115/83, pulse 66, temperature 98.2 F (36.8 C), temperature source Oral, resp. rate (!) 7, height 5\' 11"  (1.803 m), weight 106.9 kg (235 lb 10.8 oz), SpO2 93 %.  PHYSICAL EXAMINATION:  GENERAL:  41 y.o.-year-old patient lying in the bed with no acute distress.  EYES: Pupils equal, round, reactive to light and accommodation. No scleral icterus. Extraocular muscles intact.  HEENT: Head atraumatic, normocephalic. Oropharynx and nasopharynx clear.  NECK:  Supple, no jugular venous distention. No thyroid enlargement, no tenderness.  LUNGS: Normal breath sounds bilaterally, no wheezing, rales,rhonchi or crepitation. No use of accessory muscles of respiration.  CARDIOVASCULAR: S1, S2 normal. No murmurs, rubs, or gallops.  ABDOMEN: Soft, Mild discomfort in epigastric area, no rebound or guarding,  nondistended. Bowel sounds present.  EXTREMITIES: No pedal edema, cyanosis, or clubbing.  NEUROLOGIC: Cranial nerves II through XII are intact. Muscle strength 5/5 in all extremities. Sensation intact. Gait not checked.  PSYCHIATRIC: The patient is alert and oriented x 3.  SKIN: No obvious rash, lesion, or ulcer.    LABORATORY PANEL:   CBC  Recent Labs Lab 05/14/17 0455  WBC 4.8  HGB 12.9*  HCT 36.4*  PLT 178   ------------------------------------------------------------------------------------------------------------------  Chemistries   Recent Labs Lab 05/13/17 1448  05/18/17 0304  NA  --   < > 137  K  --   < > 3.3*  CL  --   < > 99*  CO2  --   < > 28  GLUCOSE  --   < > 148*  BUN  --   < > 6  CREATININE  --   < > 0.83  CALCIUM  --   < > 8.5*  MG 3.4*  --   --   AST  --   < > 21  ALT  --   < > 23  ALKPHOS  --   < > 57  BILITOT  --   < > 0.7  < > = values in this interval not displayed. ------------------------------------------------------------------------------------------------------------------  Cardiac Enzymes No results for input(s): TROPONINI in the last 168 hours. ------------------------------------------------------------------------------------------------------------------  RADIOLOGY:  No results found.  EKG:   Orders placed or performed during the hospital encounter of 05/13/17  . EKG 12-Lead  . EKG 12-Lead    ASSESSMENT AND PLAN:   Sergio [name]  is a 41 y.o. male with a known history of Insulin requiring diabetes mellitus, hypertension, history  of familial hypertriglyceridemia, gastric ulcer and multiple other medical problems was admitted to the hospital with acute pancreatitis from hypertriglyceridemia in the past. Patient is presenting with severe epigastric abdominal pain radiating to the left upper quadrant associated with nausea and vomiting   # Acute epigastric abdominal pain secondary to acute pancreatitis from  hypertriglyceridemia Clinically  Improved Now on heart healthy diet Abdominal ultrasound is ordered, patient declined  # Hypertriglyceridemia  Following serial triglycerides,  triglycerides 5038 --[id number], now on advance insulin doses intravenously Patitent reports he does not drink alcohol. Resume home medication Crestor, Lopid and Lovaza upon discharge. Discussed with Dr. Mortimer Fries today  #Insulin requiring diabetes mellitus Stop insulin pump - home med Continue on D5 1/2 nss and insulin drip, blood glucose levels are ranging between 170-250, triglyceride level has worsened today, may benefit from decreasing the D5 infusion rate  #Hyponatremia secondary to hyper lipidemia/dehydration Resolved Monitor sodium levels, 128-129--137-133-137   #Depression with anxiety on Zoloft Will give  Xanax as needed  #Obstructive sleep apnea continue CPAP daily at bedtime   #Noncompliance According to endocrinology Dr. Gabriel Carina patient is noncompliant, reinforced the importance of being compliant with medications and follow-up appointments, likely discharge tomorrow, if triglyceride level is below 1000 with follow-up with Dr. Gabriel Carina as outpatient     All the records are reviewed and case discussed with Care Management/Social Workerr. Management plans discussed with the patient, family and they are in agreement.  CODE STATUS: fc   TOTAL TIME TAKING CARE OF THIS PATIENT: 35  minutes.   POSSIBLE D/C IN 2-3  DAYS, DEPENDING ON CLINICAL CONDITION.  Note: This dictation was prepared with Dragon dictation along with smaller phrase technology. Any transcriptional errors that result from this process are unintentional.   Latonia Conrow M.D on 05/19/2017 at 12:31 PM  Between 7am to 6pm - Pager - [phone] After 6pm go to [email or web address] - password [email or web address] [facility]  Office  [phone]  CC: Primary care physician; Crecencio Mc, MD

## 2017-05-19 NOTE — Progress Notes (Signed)
   05/19/17 0851  Charting Type  Charting Type Shift assessment  Orders Chart Check (once per shift) Completed  Neurological  Neuro (WDL) WDL  Glasgow Coma Scale  Eye Opening 4  Best Verbal Response (NON-intubated) 5  Best Motor Response 6  Glasgow Coma Scale Score 15  Richmond Agitation Sedation Scale  Richmond Agitation Sedation Scale (RASS) 0  RASS Goal 0  CAM-ICU Feature 1:  Acute onset of fluctuating course  Is the patient different from his/her baseline mental status? No  Has the patient had any fluctuation in mental status in the past 24 hours? No  CAM-ICU Feature 4:  Disorganized thinking  CAM-ICU Negative? Yes  Delirium Prevention:  Universal Requirements (Complete for all ICU patients)  Universal Precautions Initiated *See Row Information* Yes  HEENT  HEENT (WDL) WDL  Vision Check No  Respiratory  Respiratory (WDL) WDL  Cough None  Respiratory Pattern Regular;Unlabored  Chest Assessment Chest expansion symmetrical  Bilateral Breath Sounds Clear  Cardiac  Cardiac (WDL) WDL  Cardiac Rhythm NSR  Telemetry Box Number [id number]  Tele Box Verification Completed by Second Verifier Completed  Vascular  Vascular (WDL) WDL  Cyanosis None  Capillary Refill Less than 3 seconds  Pulses R radial;L radial;R dorsalis pedis;L dorsalis pedis  RUE Neurovascular Assessment  R Radial Pulse +3  LUE Neurovascular Assessment  L Radial Pulse +3  RLE Neurovascular Assessment  R Dorsalis Pedis Pulse +3  LLE Neurovascular Assessment  L Dorsalis Pedis Pulse +3  Integumentary  Integumentary (WDL) WDL  Sacral Foam Prophylactic Dressing Protocol  Dressing Interventions Dressing removed/protocol ended  Braden Scale (Ages 8 and up)  Sensory Perceptions 4  Moisture 4  Activity 3  Mobility 4  Nutrition 3  Friction and Shear 3  Braden Scale Score 21  Braden Interventions  Braden Scale Interventions Reposition q2h;Heels;Mattress  Musculoskeletal  Musculoskeletal (WDL) WDL   Gastrointestinal  Gastrointestinal (WDL) WDL  Abdomen Inspection Soft  Bowel Sounds Assessment Active  Tenderness Nontender  Last BM Date 05/18/17  Stool Characteristics  Bowel Incontinence No  GU Assessment  Genitourinary (WDL) X  Genitourinary Symptoms Urinary Catheter  Genitalia  Male Genitalia Intact  Urethral Catheter Lolita Cram RN Latex 16 Fr.  Placement Date/Time: 05/15/17 1849   Perineal care performed prior to insertion?: Yes  Person Inserting Catheter: Lolita Cram RN  Person Assisting with Catheter Insertion: Olen Pel NT  Patient Location at Time of Insertion: ICU 7  Catheter Type: ...  Indication for Insertion or Continuance of Catheter Other (comment) (patient request, MD aware. )  Site Assessment Clean;Intact  Catheter Maintenance Bag below level of bladder;Catheter secured;Drainage bag/tubing not touching floor;Insertion date on drainage bag;No dependent loops;Seal intact;Bag emptied prior to Engineer, agricultural drainage bag  Securement Method Leg strap  Urinary Catheter Interventions Unclamped  Psychosocial  Psychosocial (WDL) WDL

## 2017-05-20 LAB — COMPREHENSIVE METABOLIC PANEL
ALT: 19 U/L (ref 17–63)
ANION GAP: 8 (ref 5–15)
AST: 20 U/L (ref 15–41)
Albumin: 4 g/dL (ref 3.5–5.0)
Alkaline Phosphatase: 65 U/L (ref 38–126)
BUN: 7 mg/dL (ref 6–20)
CHLORIDE: 98 mmol/L — AB (ref 101–111)
CO2: 32 mmol/L (ref 22–32)
CREATININE: 0.86 mg/dL (ref 0.61–1.24)
Calcium: 9.2 mg/dL (ref 8.9–10.3)
Glucose, Bld: 82 mg/dL (ref 65–99)
POTASSIUM: 3.7 mmol/L (ref 3.5–5.1)
SODIUM: 138 mmol/L (ref 135–145)
Total Bilirubin: 0.5 mg/dL (ref 0.3–1.2)
Total Protein: 7.8 g/dL (ref 6.5–8.1)

## 2017-05-20 LAB — CREATININE, SERUM
Creatinine, Ser: 0.84 mg/dL (ref 0.61–1.24)
GFR calc Af Amer: >60 mL/min (ref >60)
GFR calc non Af Amer: >60 mL/min (ref >60)

## 2017-05-20 LAB — GLUCOSE, CAPILLARY
GLUCOSE-CAPILLARY: 96 mg/dL (ref 65–99)
GLUCOSE-CAPILLARY: 92 mg/dL (ref 65–99)
GLUCOSE-CAPILLARY: 87 mg/dL (ref 65–99)
GLUCOSE-CAPILLARY: 126 mg/dL — AB (ref 65–99)
GLUCOSE-CAPILLARY: 104 mg/dL — AB (ref 65–99)
GLUCOSE-CAPILLARY: 207 mg/dL — AB (ref 65–99)
GLUCOSE-CAPILLARY: 281 mg/dL — AB (ref 65–99)
GLUCOSE-CAPILLARY: 250 mg/dL — AB (ref 65–99)
Glucose-Capillary: 118 mg/dL — ABNORMAL HIGH (ref 65–99)
Glucose-Capillary: 202 mg/dL — ABNORMAL HIGH (ref 65–99)
Glucose-Capillary: 170 mg/dL — ABNORMAL HIGH (ref 65–99)

## 2017-05-20 LAB — TRIGLYCERIDES: Triglycerides: 1266 mg/dL — ABNORMAL HIGH (ref <150)

## 2017-05-20 MED ORDER — ADULT MULTIVITAMIN W/MINERALS CH
1.0000 | ORAL_TABLET | Freq: Every day | ORAL | Status: DC
  Administered 2017-05-20 – 2017-05-23 (×4): 1 via ORAL
  Filled 2017-05-20 (×4): qty 1

## 2017-05-20 NOTE — Progress Notes (Signed)
Cc  follow-up abdominal pain pancreatitis   History of present illness No new complaints.  Remains on insulin infusion for severe hypertriglyceridemia. Abdominal discomfort much improved but still there On nasal CPAP for OSA Asking for pain meds Insulin drip increased from yesterday  Vitals:   05/19/17 2207 05/20/17 0000 05/20/17 0200 05/20/17 0400  BP: 128/84 114/83 (!) 120/91 (!) 131/91  Pulse: 85 68 67 67  Resp: 16 12 14 18   Temp:   97.9 F (36.6 C)   TempSrc:   Oral   SpO2: 94% 97% 97% 98%  Weight:      Height:       NAD HEENT WNL No JVD Chest clear Reg, no M Mildly tender, +BS Ext warm, no edema    Triglycerides: 3978-->2586-->1815-->1300-->2400-->1200   No new CXR  IMPRESSION: Severe hypertriglyceridemia Pancreatitis Type 2 diabetes  PLAN/REC: Continue current therapies Will remain in SDU as long as he is requiring insulin infusion Will need to increase insulin drip infusion will need to discuss with pharmacy    Corrin Parker, M.D.  [facility]  Medical Director [place] Director [place] Department

## 2017-05-20 NOTE — Progress Notes (Signed)
[place] at [place] NAME: Agapito Hanway    MR#:  [id number]  DATE OF BIRTH:  1976/04/14  SUBJECTIVE:  CHIEF COMPLAINT: The patient is tolerating diet, abd pain is better  REVIEW OF SYSTEMS:  CONSTITUTIONAL: No fever, fatigue or weakness.  EYES: No blurred or double vision.  EARS, NOSE, AND THROAT: No tinnitus or ear pain.  RESPIRATORY: No cough, shortness of breath, wheezing or hemoptysis.  CARDIOVASCULAR: No chest pain, orthopnea, edema.  GASTROINTESTINAL:denies vomiting,. Epigastric abdominal pain is improving  HEMATOLOGY: No anemia, easy bruising or bleeding SKIN: No rash or lesion. MUSCULOSKELETAL: No joint pain or arthritis.   NEUROLOGIC: No tingling, numbness, weakness.  PSYCHIATRY: No anxiety or depression.   DRUG ALLERGIES:   Allergies  Allergen Reactions  . Codeine Anaphylaxis  . Ivp Dye [Iodinated Diagnostic Agents] Other (See Comments)    Kidneys stop working    VITALS:  Blood pressure (!) 141/91, pulse 73, temperature 97.7 F (36.5 C), temperature source Axillary, resp. rate 19, height 5\' 11"  (1.803 m), weight 106.9 kg (235 lb 10.8 oz), SpO2 95 %.  PHYSICAL EXAMINATION:  GENERAL:  41 y.o.-year-old patient lying in the bed with no acute distress.  EYES: Pupils equal, round, reactive to light and accommodation. No scleral icterus. Extraocular muscles intact.  HEENT: Head atraumatic, normocephalic. Oropharynx and nasopharynx clear.  NECK:  Supple, no jugular venous distention. No thyroid enlargement, no tenderness.  LUNGS: Normal breath sounds bilaterally, no wheezing, rales,rhonchi or crepitation. No use of accessory muscles of respiration.  CARDIOVASCULAR: S1, S2 normal. No murmurs, rubs, or gallops.  ABDOMEN: Soft, Mild discomfort in epigastric area, no rebound or guarding, nondistended. Bowel sounds present.  EXTREMITIES: No pedal edema, cyanosis, or clubbing.  NEUROLOGIC: Cranial nerves II through XII are  intact. Muscle strength 5/5 in all extremities. Sensation intact. Gait not checked.  PSYCHIATRIC: The patient is alert and oriented x 3.  SKIN: No obvious rash, lesion, or ulcer.    LABORATORY PANEL:   CBC  Recent Labs Lab 05/14/17 0455  WBC 4.8  HGB 12.9*  HCT 36.4*  PLT 178   ------------------------------------------------------------------------------------------------------------------  Chemistries   Recent Labs Lab 05/13/17 1448  05/18/17 0304 05/20/17 0437  NA  --   < > 137  --   K  --   < > 3.3*  --   CL  --   < > 99*  --   CO2  --   < > 28  --   GLUCOSE  --   < > 148*  --   BUN  --   < > 6  --   CREATININE  --   < > 0.83 0.84  CALCIUM  --   < > 8.5*  --   MG 3.4*  --   --   --   AST  --   < > 21  --   ALT  --   < > 23  --   ALKPHOS  --   < > 57  --   BILITOT  --   < > 0.7  --   < > = values in this interval not displayed. ------------------------------------------------------------------------------------------------------------------  Cardiac Enzymes No results for input(s): TROPONINI in the last 168 hours. ------------------------------------------------------------------------------------------------------------------  RADIOLOGY:  No results found.  EKG:   Orders placed or performed during the hospital encounter of 05/13/17  . EKG 12-Lead  . EKG 12-Lead    ASSESSMENT AND PLAN:   Geri Seminole  is a 41 y.o. male with a known history of Insulin requiring diabetes mellitus, hypertension, history of familial hypertriglyceridemia, gastric ulcer and multiple other medical problems was admitted to the hospital with acute pancreatitis from hypertriglyceridemia in the past. Patient is presenting with severe epigastric abdominal pain radiating to the left upper quadrant associated with nausea and vomiting   # Acute epigastric abdominal pain secondary to acute pancreatitis from hypertriglyceridemia Clinically  Improved Now on heart healthy diet   Abdominal ultrasound is ordered, patient declined  # Hypertriglyceridemia  Following serial triglycerides,  triglycerides 5038 --[id number] 1266, now on advanced insulin doses intravenously Patitent reports he does not drink alcohol. Resume home medication Crestor, Lopid and Lovaza upon discharge. Discussed with Dr. Mortimer Fries today  #Insulin requiring diabetes mellitus Stop insulin pump - home med Continue on D5 1/2 nss and insulin drip, blood glucose levels are ranging between 170-250, triglyceride level has worsened today, may benefit from decreasing the D5 infusion rate  #Hyponatremia secondary to hyper lipidemia/dehydration Resolved Monitor sodium levels, 128-129--137-133-137   #Depression with anxiety on Zoloft Will give  Xanax as needed  #Obstructive sleep apnea continue CPAP daily at bedtime   #Noncompliance According to endocrinology Dr. Gabriel Carina patient is noncompliant, reinforced the importance of being compliant with medications and follow-up appointments, likely discharge tomorrow, if triglyceride level is below 1000 with follow-up with Dr. Gabriel Carina as outpatient     All the records are reviewed and case discussed with Care Management/Social Workerr. Management plans discussed with the patient, family and they are in agreement.  CODE STATUS: fc   TOTAL TIME TAKING CARE OF THIS PATIENT: 35  minutes.   POSSIBLE D/C IN 2-3  DAYS, DEPENDING ON CLINICAL CONDITION.  Note: This dictation was prepared with Dragon dictation along with smaller phrase technology. Any transcriptional errors that result from this process are unintentional.   [name] M.D on 05/20/2017 at 1:02 PM  Between 7am to 6pm - Pager - [phone] After 6pm go to [email or web address] - password [email or web address] [facility]  Office  [phone]  CC: Primary care physician; Crecencio Mc, MD

## 2017-05-20 NOTE — Progress Notes (Signed)
Remains on Insulin drip at 8 units/hour and D10 1/2 NS. D10 1/2NS rate increased to 100 mls/hour.Triglycerides down to 1266. Dilaudid 1 mg.given every 3 hours and Oxy IR given every 6 hours for abdominal pain.Stable.

## 2017-05-20 NOTE — Progress Notes (Signed)
Spoke with [place], NP about patients CBGs trending down during this shift. Continue current therapies at this time and will continue to montior CBGs every 2 hours at this time.

## 2017-05-20 NOTE — Progress Notes (Signed)
Initial Nutrition Assessment  DOCUMENTATION CODES:   Obesity unspecified  INTERVENTION:   Pt refuses all supplements or snacks  Carbohydrate modified/heart healthy diet   Recommend pt restarting MVI, calcium, Vitamin D and iron supplements as these are recommend indefinitly in patients who undergo gastric sleeve surgery due to increased risk of nutrient deficiencies.   NUTRITION DIAGNOSIS:   Limited adherence to nutrition-related recommendations related to poor compliance with diet and medications as evidenced by patient reports poor compliance with diet and medication relating to recent gastric sleeve and DM.  GOAL:   Patient will meet greater than or equal to 90% of their needs  MONITOR:   PO intake, Supplement acceptance, Labs, Weight trends  REASON FOR ASSESSMENT:   Consult Assessment of nutrition requirement/status  ASSESSMENT:   41 year old male with a past medical history of sleep apnea uses CPAP qhs, recurrent acute pancreatitis secondary to hypertriglyceridemia, morbid obesity s/p gastric sleeve 11/2014, hypertension, gastric ulcer, insulin depenent diabetes mellitus, depression, anxiety, and familial severe hypertriglyceridemia.  He presented to [facility] ER 08/3 with complaints of severe epigastric abdominal pain, nausea, and vomiting onset of symptoms 2 days prior to presentation. Pt was found to have recurrent pancreatitis    RD met with pt in room today. Pt reluctant to speak with RD and did not seem very engaged in conversation. Pt s/p gastric sleeve in 11/2014. Pt reports that he was never compliant with diet recommendations and that he quit taking his daily vitamins months after his surgery. It appears, from a chart review that pt only lost down to 212lbs(26lb loss) and then began to gain the weight back. Pt presents today only 3lbs less than he weighed on his gastric sleeve surgery day. Per notes from pt's endocrinologist and previous Dietitians, it appears that pt has  also been non-compliant with his insulin regimen and diet. Pt uses an insulin pump at home; RD is unsure if pt is able to carbohydrate count correctly. Pt also does not check his blood sugar regularly. Pt has been seen by multiple Dietitians for education in the past and still remains non-compliant. Pt reports good appetite today; eating 100% of meals when he is hungry. Pt did not want breakfast this morning. Pt is on a carb controlled/heart healthy diet. Pt does not want supplements or snacks and reports that he will just ask his RN if he needs something. Pt reports good appetite and oral intake up until 3 days pta r/t nausea/vomiting. Pt denies any nausea/vomiting today. RD discussed with pt the importance of adequate protein intake and good compliance with meds and diet. RD expects continued poor compliance from this pt. Pt remains on insulin drip; triglycerides improving.     Medications reviewed and include: lovenox, Omega-3, protonix, NaCl w/ dextrose, insulin, hydromorphone, oxycodone   Labs reviewed: K 3.3(L), Cl 99(L), Ca 8.5(L) P 3.9 wnl, Mg 3.4(H)- 8/3 Triglycerides- 1266(H)- improving  Cholesterol- 597(H)- 8/4 cbgs- 102, 142, 148 x 48hrs  AIC 10.3(H)- 4/20  Nutrition-Focused physical exam completed. Findings are no fat depletion, no muscle depletion, and no edema.   Diet Order:  Diet heart healthy/carb modified Room service appropriate? Yes; Fluid consistency: Thin  Skin:  Reviewed, no issues  Last BM:  8/8  Height:   Ht Readings from Last 1 Encounters:  05/13/17 5' 11"  (1.803 m)    Weight:   Wt Readings from Last 1 Encounters:  05/13/17 235 lb 10.8 oz (106.9 kg)    Ideal Body Weight:  78.1 kg  BMI:  Body mass index is 32.87 kg/m.  Estimated Nutritional Needs:   Kcal:  2100-2400kcal/day   Protein:  107-128g/day   Fluid:  >2L/day   EDUCATION NEEDS:   Education needs addressed  Koleen Distance MS, RD, LDN Pager #[phone] After Hours Pager: [phone]

## 2017-05-21 LAB — GLUCOSE, CAPILLARY
GLUCOSE-CAPILLARY: 101 mg/dL — AB (ref 65–99)
GLUCOSE-CAPILLARY: 110 mg/dL — AB (ref 65–99)
GLUCOSE-CAPILLARY: 233 mg/dL — AB (ref 65–99)
GLUCOSE-CAPILLARY: 97 mg/dL (ref 65–99)
Glucose-Capillary: 176 mg/dL — ABNORMAL HIGH (ref 65–99)
Glucose-Capillary: 150 mg/dL — ABNORMAL HIGH (ref 65–99)
Glucose-Capillary: 116 mg/dL — ABNORMAL HIGH (ref 65–99)
Glucose-Capillary: 108 mg/dL — ABNORMAL HIGH (ref 65–99)
Glucose-Capillary: 136 mg/dL — ABNORMAL HIGH (ref 65–99)
Glucose-Capillary: 124 mg/dL — ABNORMAL HIGH (ref 65–99)
Glucose-Capillary: 83 mg/dL (ref 65–99)
Glucose-Capillary: 98 mg/dL (ref 65–99)

## 2017-05-21 LAB — TRIGLYCERIDES
Triglycerides: 1520 mg/dL — ABNORMAL HIGH (ref <150)
Triglycerides: 1155 mg/dL — ABNORMAL HIGH (ref <150)

## 2017-05-21 LAB — MAGNESIUM: MAGNESIUM: 1.9 mg/dL (ref 1.7–2.4)

## 2017-05-21 LAB — PHOSPHORUS: Phosphorus: 4.5 mg/dL (ref 2.5–4.6)

## 2017-05-21 MED ORDER — DOCUSATE SODIUM 100 MG PO CAPS
100.0000 mg | ORAL_CAPSULE | Freq: Two times a day (BID) | ORAL | Status: DC
  Administered 2017-05-23: 09:00:00 100 mg via ORAL
  Filled 2017-05-21 (×4): qty 1

## 2017-05-21 MED ORDER — PANCRELIPASE (LIP-PROT-AMYL) 12000-38000 UNITS PO CPEP
36000.0000 [IU] | ORAL_CAPSULE | Freq: Three times a day (TID) | ORAL | Status: DC
  Administered 2017-05-21 – 2017-05-23 (×5): 36000 [IU] via ORAL
  Filled 2017-05-21 (×5): qty 3

## 2017-05-21 NOTE — Progress Notes (Signed)
[place] at [place] NAME: Sergio [name]    MR#:  [id number]  DATE OF BIRTH:  05-01-1976  SUBJECTIVE:  CHIEF COMPLAINT: The patient is reprting abd discomfort with food  REVIEW OF SYSTEMS:  CONSTITUTIONAL: No fever, fatigue or weakness.  EYES: No blurred or double vision.  EARS, NOSE, AND THROAT: No tinnitus or ear pain.  RESPIRATORY: No cough, shortness of breath, wheezing or hemoptysis.  CARDIOVASCULAR: No chest pain, orthopnea, edema.  GASTROINTESTINAL:denies vomiting,. Epigastric abdominal pain is improving  HEMATOLOGY: No anemia, easy bruising or bleeding SKIN: No rash or lesion. MUSCULOSKELETAL: No joint pain or arthritis.   NEUROLOGIC: No tingling, numbness, weakness.  PSYCHIATRY: No anxiety or depression.   DRUG ALLERGIES:   Allergies  Allergen Reactions  . Codeine Anaphylaxis  . Ivp Dye [Iodinated Diagnostic Agents] Other (See Comments)    Kidneys stop working    VITALS:  Blood pressure (!) 125/94, pulse 70, temperature 97.8 F (36.6 C), resp. rate 14, height 5\' 11"  (1.803 m), weight 106.9 kg (235 lb 10.8 oz), SpO2 93 %.  PHYSICAL EXAMINATION:  GENERAL:  41 y.o.-year-old patient lying in the bed with no acute distress.  EYES: Pupils equal, round, reactive to light and accommodation. No scleral icterus. Extraocular muscles intact.  HEENT: Head atraumatic, normocephalic. Oropharynx and nasopharynx clear.  NECK:  Supple, no jugular venous distention. No thyroid enlargement, no tenderness.  LUNGS: Normal breath sounds bilaterally, no wheezing, rales,rhonchi or crepitation. No use of accessory muscles of respiration.  CARDIOVASCULAR: S1, S2 normal. No murmurs, rubs, or gallops.  ABDOMEN: Soft, Mild discomfort in epigastric area, no rebound or guarding, nondistended. Bowel sounds present.  EXTREMITIES: No pedal edema, cyanosis, or clubbing.  NEUROLOGIC: Cranial nerves II through XII are intact. Muscle strength 5/5 in all  extremities. Sensation intact. Gait not checked.  PSYCHIATRIC: The patient is alert and oriented x 3.  SKIN: No obvious rash, lesion, or ulcer.    LABORATORY PANEL:   CBC No results for input(s): WBC, HGB, HCT, PLT in the last 168 hours. ------------------------------------------------------------------------------------------------------------------  Chemistries   Recent Labs Lab 05/20/17 0437 05/21/17 0501  NA 138  --   K 3.7  --   CL 98*  --   CO2 32  --   GLUCOSE 82  --   BUN 7  --   CREATININE 0.86  0.84  --   CALCIUM 9.2  --   MG  --  1.9  AST 20  --   ALT 19  --   ALKPHOS 65  --   BILITOT 0.5  --    ------------------------------------------------------------------------------------------------------------------  Cardiac Enzymes No results for input(s): TROPONINI in the last 168 hours. ------------------------------------------------------------------------------------------------------------------  RADIOLOGY:  No results found.  EKG:   Orders placed or performed during the hospital encounter of 05/13/17  . EKG 12-Lead  . EKG 12-Lead    ASSESSMENT AND PLAN:   Sergio [name]  is a 41 y.o. male with a known history of Insulin requiring diabetes mellitus, hypertension, history of familial hypertriglyceridemia, gastric ulcer and multiple other medical problems was admitted to the hospital with acute pancreatitis from hypertriglyceridemia in the past. Patient is presenting with severe epigastric abdominal pain radiating to the left upper quadrant associated with nausea and vomiting   # Acute epigastric abdominal pain secondary to acute pancreatitis from hypertriglyceridemia Triglycerides are getting worse while on diet Npo  Abdominal ultrasound is ordered, patient declined  # Hypertriglyceridemia  Following serial triglycerides,  triglycerides 5038 --  [id number] [id number]  advanced insulin doses intravenously Patitent reports he does not  drink alcohol. Resume home medication Crestor, Lopid and Lovaza upon discharge. Discussed with Dr. Mortimer Fries today  #Insulin requiring diabetes mellitus Stop insulin pump - home med Continue on D5 1/2 nss and insulin drip, blood glucose levels are ranging between 170-250, triglyceride level has worsened today, may benefit from decreasing the D5 infusion rate  #Hyponatremia secondary to hyper lipidemia/dehydration Resolved Monitor sodium levels, 128-129--137-133-137   #Depression with anxiety on Zoloft Will give  Xanax as needed  #Obstructive sleep apnea continue CPAP daily at bedtime   #Noncompliance According to endocrinology Dr. Gabriel Carina patient is noncompliant, reinforced the importance of being compliant with medications and follow-up appointments, likely discharge tomorrow, if triglyceride level is below 1000 with follow-up with Dr. Gabriel Carina as outpatient     All the records are reviewed and case discussed with Care Management/Social Workerr. Management plans discussed with the patient, family and they are in agreement.  CODE STATUS: fc   TOTAL TIME TAKING CARE OF THIS PATIENT: 35  minutes.   POSSIBLE D/C IN 2-3  DAYS, DEPENDING ON CLINICAL CONDITION.  Note: This dictation was prepared with Dragon dictation along with smaller phrase technology. Any transcriptional errors that result from this process are unintentional.   [name] M.D on 05/21/2017 at 1:31 PM  Between 7am to 6pm - Pager - [phone] After 6pm go to [email or web address] - password [email or web address] [facility]  Office  [phone]  CC: Primary care physician; Crecencio Mc, MD

## 2017-05-21 NOTE — Progress Notes (Addendum)
Disappointing day for patient. Triglycerides went up instead of down. Patient very upset and requested bedtime Xanax at 1252. Given due to anxiousness. Remains on Insulin drip at 8 units per hour and D10 1/2 NS at 100 mls.per hour. Now NPO. Patient and wife talked to Dr. Margaretmary Eddy and pancreatic enzymes restarted on home dose.Calmer this afternoon. Wife and children came to visit. 1700 Triglycerides down to 1155. Patient more hopeful.

## 2017-05-21 NOTE — Progress Notes (Signed)
1000 Patient request to speak with Dr. Juanell Fairly about triglycerides increasing to 1520. Patient very upset. 11 Dr. Juanell Fairly in to see patient. Patient's dietary status changed to NPO.

## 2017-05-22 LAB — GLUCOSE, CAPILLARY
GLUCOSE-CAPILLARY: 107 mg/dL — AB (ref 65–99)
GLUCOSE-CAPILLARY: 120 mg/dL — AB (ref 65–99)
GLUCOSE-CAPILLARY: 143 mg/dL — AB (ref 65–99)
GLUCOSE-CAPILLARY: 159 mg/dL — AB (ref 65–99)
GLUCOSE-CAPILLARY: 75 mg/dL (ref 65–99)
GLUCOSE-CAPILLARY: 75 mg/dL (ref 65–99)
GLUCOSE-CAPILLARY: 87 mg/dL (ref 65–99)
Glucose-Capillary: 100 mg/dL — ABNORMAL HIGH (ref 65–99)
Glucose-Capillary: 105 mg/dL — ABNORMAL HIGH (ref 65–99)
Glucose-Capillary: 108 mg/dL — ABNORMAL HIGH (ref 65–99)
Glucose-Capillary: 131 mg/dL — ABNORMAL HIGH (ref 65–99)
Glucose-Capillary: 67 mg/dL (ref 65–99)

## 2017-05-22 LAB — TRIGLYCERIDES: TRIGLYCERIDES: 903 mg/dL — AB (ref <150)

## 2017-05-22 MED ORDER — PANTOPRAZOLE SODIUM 40 MG PO PACK
20.0000 mg | PACK | Freq: Every day | ORAL | Status: DC
  Administered 2017-05-23: 20 mg via ORAL
  Filled 2017-05-22: qty 20

## 2017-05-22 MED ORDER — INSULIN PUMP
SUBCUTANEOUS | Status: DC
  Filled 2017-05-22: qty 1

## 2017-05-22 MED ORDER — SODIUM CHLORIDE 0.9% FLUSH
3.0000 mL | INTRAVENOUS | Status: DC | PRN
  Administered 2017-05-22: 19:00:00 3 mL via INTRAVENOUS

## 2017-05-22 MED ORDER — INSULIN PUMP
SUBCUTANEOUS | Status: DC
  Administered 2017-05-22: 1 via SUBCUTANEOUS
  Filled 2017-05-22: qty 1

## 2017-05-22 MED ORDER — INSULIN PUMP
SUBCUTANEOUS | Status: DC
  Administered 2017-05-23: 09:00:00 via SUBCUTANEOUS
  Filled 2017-05-22: qty 1

## 2017-05-22 MED ORDER — DEXTROSE 50 % IV SOLN
50.0000 mL | Freq: Once | INTRAVENOUS | Status: AC
Start: 2017-05-22 — End: 2017-05-22
  Administered 2017-05-22: 50 mL via INTRAVENOUS

## 2017-05-22 MED ORDER — DEXTROSE 50 % IV SOLN
INTRAVENOUS | Status: AC
  Filled 2017-05-22: qty 50

## 2017-05-22 NOTE — Progress Notes (Signed)
[place] at [place] NAME: Sergio [name]    MR#:  [id number]  DATE OF BIRTH:  [birth year]  SUBJECTIVE:  CHIEF COMPLAINT: The patient is Feeling better abdominal pain significantly improved Denies any nausea vomiting.  REVIEW OF SYSTEMS:  CONSTITUTIONAL: No fever, fatigue or weakness.  EYES: No blurred or double vision.  EARS, NOSE, AND THROAT: No tinnitus or ear pain.  RESPIRATORY: No cough, shortness of breath, wheezing or hemoptysis.  CARDIOVASCULAR: No chest pain, orthopnea, edema.  GASTROINTESTINAL:denies vomiting,. Epigastric abdominal pain is improving  HEMATOLOGY: No anemia, easy bruising or bleeding SKIN: No rash or lesion. MUSCULOSKELETAL: No joint pain or arthritis.   NEUROLOGIC: No tingling, numbness, weakness.  PSYCHIATRY: No anxiety or depression.   DRUG ALLERGIES:   Allergies  Allergen Reactions  . Codeine Anaphylaxis  . Ivp Dye [Iodinated Diagnostic Agents] Other (See Comments)    Kidneys stop working    VITALS:  Blood pressure (!) 138/95, pulse 68, temperature 97.7 F (36.5 C), temperature source Axillary, resp. rate 12, height 5\' 11"  (1.803 m), weight 106.9 kg (235 lb 10.8 oz), SpO2 92 %.  PHYSICAL EXAMINATION:  GENERAL:  41 y.o.-year-old patient lying in the bed with no acute distress.  EYES: Pupils equal, round, reactive to light and accommodation. No scleral icterus. Extraocular muscles intact.  HEENT: Head atraumatic, normocephalic. Oropharynx and nasopharynx clear.  NECK:  Supple, no jugular venous distention. No thyroid enlargement, no tenderness.  LUNGS: Normal breath sounds bilaterally, no wheezing, rales,rhonchi or crepitation. No use of accessory muscles of respiration.  CARDIOVASCULAR: S1, S2 normal. No murmurs, rubs, or gallops.  ABDOMEN: Soft, Mild discomfort in epigastric area, no rebound or guarding, nondistended. Bowel sounds present.  EXTREMITIES: No pedal edema, cyanosis, or clubbing.   NEUROLOGIC: Cranial nerves II through XII are intact. Muscle strength 5/5 in all extremities. Sensation intact. Gait not checked.  PSYCHIATRIC: The patient is alert and oriented x 3.  SKIN: No obvious rash, lesion, or ulcer.    LABORATORY PANEL:   CBC No results for input(s): WBC, HGB, HCT, PLT in the last 168 hours. ------------------------------------------------------------------------------------------------------------------  Chemistries   Recent Labs Lab 05/20/17 0437 05/21/17 0501  NA 138  --   K 3.7  --   CL 98*  --   CO2 32  --   GLUCOSE 82  --   BUN 7  --   CREATININE 0.86  0.84  --   CALCIUM 9.2  --   MG  --  1.9  AST 20  --   ALT 19  --   ALKPHOS 65  --   BILITOT 0.5  --    ------------------------------------------------------------------------------------------------------------------  Cardiac Enzymes No results for input(s): TROPONINI in the last 168 hours. ------------------------------------------------------------------------------------------------------------------  RADIOLOGY:  No results found.  EKG:   Orders placed or performed during the hospital encounter of 05/13/17  . EKG 12-Lead  . EKG 12-Lead    ASSESSMENT AND PLAN:   Sergio [name]  is a 41 y.o. male with a known history of Insulin requiring diabetes mellitus, hypertension, history of familial hypertriglyceridemia, gastric ulcer and multiple other medical problems was admitted to the hospital with acute pancreatitis from hypertriglyceridemia in the past. Patient is presenting with severe epigastric abdominal pain radiating to the left upper quadrant associated with nausea and vomiting   # Acute epigastric abdominal pain secondary to acute pancreatitis from hypertriglyceridemia Triglycerides are Better today  Clear liquid diet and advance as tolerated Abdominal ultrasound is ordered, patient  declined  # Hypertriglyceridemia  Following serial triglycerides,  triglycerides 5038  --[id number]- [id number]  Discontinue insulin drip , dextrose infusion and resume patient's insulin pump Patient will be transferred to the floor if clinically stable after discontinuing insulin drip and dextrose infusion Patitent reports he does not drink alcohol.  Continue home medication Crestor, Lopid, Creon and Lovaza upon discharge. Discussed with Dr. Ashby Dawes today  #Insulin requiring diabetes mellitus Resume insulin pump - home med after stopping insulin drip Outpatient follow-up with Dr. Gabriel Carina endocrinologist  #Hyponatremia secondary to hyper lipidemia/dehydration Resolved Monitor sodium levels, 128-129--137-133-137   #Depression with anxiety on Zoloft Will give  Xanax as needed  #Obstructive sleep apnea continue CPAP daily at bedtime   #Noncompliance According to endocrinology Dr. Gabriel Carina patient is noncompliant, reinforced the importance of being compliant with medications and follow-up appointments, likely discharge tomorrow, if triglyceride level is below 1000 with follow-up with Dr. Gabriel Carina as outpatient     All the records are reviewed and case discussed with Care Management/Social Workerr. Management plans discussed with the patient, family and they are in agreement.  CODE STATUS: fc   TOTAL TIME TAKING CARE OF THIS PATIENT: 30  minutes.   POSSIBLE D/C IN 2-3  DAYS, DEPENDING ON CLINICAL CONDITION.  Note: This dictation was prepared with Dragon dictation along with smaller phrase technology. Any transcriptional errors that result from this process are unintentional.   [name] M.D on 05/22/2017 at 12:24 PM  Between 7am to 6pm - Pager - [phone] After 6pm go to [email or web address] - password [email or web address] [facility]  Office  [phone]  CC: Primary care physician; Crecencio Mc, MD

## 2017-05-22 NOTE — Progress Notes (Signed)
Pt transferred from CCU. Insulin pump contract signed after verbal and written education completed; insulin pump flowsheet placed at bedside for pt use with education done. Dilaudid given for pain effective. No acute distress.

## 2017-05-22 NOTE — Progress Notes (Signed)
Patient rested well throughout night. CBG trended down during night. Lowest CBG was 75. D10/0.45%NS increased to 125 ml/hr. CBG increased to 108 at next CBG check. Triglycerides decreased to 903.

## 2017-05-22 NOTE — Progress Notes (Signed)
Name: Sergio [name] MRN:   [id number] DOB:   03/03/1976           ADMISSION DATE:  05/13/2017 CONSULTATION DATE: 05/13/2017   BRIEF PATIENT DESCRIPTION:  41 year old male admitted 08/3 with acute pancreatitis secondary to hypertriglyceridemia and hyponatremia   SUBJECTIVE: No new complaints. Triglycerides <1000 this morning Remains on insulin infusion. Abdominal discomfort much improved but still there On nasal CPAP for OSA Asking for pain meds  OBJECTIVE  Vitals:   05/21/17 1800 05/21/17 2000 05/22/17 0207 05/22/17 0400  BP: 109/65  (!) 132/92 114/80  Pulse: 68  67 62  Resp: (!) 7  (!) 8 (!) 9  Temp:  97.8 F (36.6 C) 97.7 F (36.5 C)   TempSrc:  Oral Axillary   SpO2: 98%  95% 96%  Weight:      Height:       NAD HEENT WNL No JVD Chest clear Reg, no M Mildly tender, +BS Ext warm, no edema    ASSESSMENT / PLAN: Acute pancreatitis secondary to hypertriglyceridemia Familial hypertriglyceridemia Hyponatremia Nausea and vomiting Acute abdominal pain-improved Hx: Insulin-dependent diabetes mellitus and OSA uses CPAP qhs P: CPAP qhs  Trend triglyceride and lipase levels D/C insulin infusion CBG's q2hr for now while on insulin gtt  Can resume  insulin pump  Continue Prn Dilaudid for pain management Prn Zofran for nausea and vomiting Monitor and replace electrolytes Monitor Is/Os  Magdalene S. Tukov ANP-BC Pulmonary and [place] Pager [phone] or [phone]  STAFF NOTE: I. Dr. Ashby Dawes, have personally reviewed the patient's available data including medical history , events of notes, physican examination and test results as part of my evaluation. I have discussed with the  Care with the NP and other care providers including  pharmacist, ICU RN, RRT, dietary.  Physical Exam Lungs - Normal air entry bilaterally.   Acute pancreatitis with hypertriglyceridemia, which is acute on chronic. Patient notes that he has  chronic hypertriglyceridemia, which is often 1000-2000 range. This is improved partly with insulin drip, predominantly with the dietary restriction. Will discontinue insulin drip today, restart the patient's own subcutaneous insulin infusion, discontinue dextrose infusion.  Marda Stalker, MD.   Board Certified in Internal Medicine, Pulmonary Medicine, [place], and Sleep Medicine.  [facility] Office Number: [phone] Pager: [phone]  Patricia Pesa, M.D.  Merton Border, M.D

## 2017-05-22 NOTE — Progress Notes (Signed)
Report called to Opal Sidles, RN on 1C. Patient transported via wheelchair to room 113.

## 2017-05-23 LAB — BASIC METABOLIC PANEL
ANION GAP: 8 (ref 5–15)
BUN: 10 mg/dL (ref 6–20)
CALCIUM: 9 mg/dL (ref 8.9–10.3)
CO2: 28 mmol/L (ref 22–32)
Chloride: 101 mmol/L (ref 101–111)
Creatinine, Ser: 0.9 mg/dL (ref 0.61–1.24)
GFR calc Af Amer: >60 mL/min (ref >60)
GLUCOSE: 80 mg/dL (ref 65–99)
POTASSIUM: 3.6 mmol/L (ref 3.5–5.1)
SODIUM: 137 mmol/L (ref 135–145)

## 2017-05-23 LAB — GLUCOSE, CAPILLARY
GLUCOSE-CAPILLARY: 82 mg/dL (ref 65–99)
GLUCOSE-CAPILLARY: 87 mg/dL (ref 65–99)
GLUCOSE-CAPILLARY: 73 mg/dL (ref 65–99)

## 2017-05-23 LAB — CBC
HCT: 42.9 % (ref 40.0–52.0)
Hemoglobin: 14.3 g/dL (ref 13.0–18.0)
MCH: 26.4 pg (ref 26.0–34.0)
MCHC: 33.3 g/dL (ref 32.0–36.0)
MCV: 79.4 fL — AB (ref 80.0–100.0)
PLATELETS: 176 10*3/uL (ref 150–440)
RBC: 5.41 MIL/uL (ref 4.40–5.90)
RDW: 15.6 % — AB (ref 11.5–14.5)
WBC: 4.6 10*3/uL (ref 3.8–10.6)

## 2017-05-23 LAB — TRIGLYCERIDES: TRIGLYCERIDES: 745 mg/dL — AB (ref <150)

## 2017-05-23 LAB — LIPASE, BLOOD: Lipase: 24 U/L (ref 11–51)

## 2017-05-23 MED ORDER — PANTOPRAZOLE SODIUM 40 MG PO PACK: 20.0000 mg | PACK | Freq: Every day | ORAL | Status: DC

## 2017-05-23 MED ORDER — DOCUSATE SODIUM 100 MG PO CAPS: 100.0000 mg | ORAL_CAPSULE | Freq: Two times a day (BID) | ORAL | 0 refills | Status: DC | PRN

## 2017-05-23 MED ORDER — OMEGA-3-ACID ETHYL ESTERS 1 G PO CAPS: 2.0000 g | ORAL_CAPSULE | Freq: Two times a day (BID) | ORAL | 0 refills | Status: DC

## 2017-05-23 MED ORDER — ROSUVASTATIN CALCIUM 10 MG PO TABS: 10.0000 mg | ORAL_TABLET | Freq: Every day | ORAL | 0 refills | Status: DC

## 2017-05-23 MED ORDER — OXYCODONE HCL 10 MG PO TABS: 10.0000 mg | ORAL_TABLET | Freq: Four times a day (QID) | ORAL | 0 refills | Status: DC | PRN

## 2017-05-23 MED ORDER — GEMFIBROZIL 600 MG PO TABS: 600.0000 mg | ORAL_TABLET | Freq: Two times a day (BID) | ORAL | 0 refills | Status: DC

## 2017-05-23 MED ORDER — PANCRELIPASE (LIP-PROT-AMYL) 36000-114000 UNITS PO CPEP: ORAL_CAPSULE | ORAL | 0 refills | Status: DC

## 2017-05-23 MED ORDER — ADULT MULTIVITAMIN W/MINERALS CH: 1.0000 | ORAL_TABLET | Freq: Every day | ORAL | Status: DC

## 2017-05-23 MED ORDER — PANTOPRAZOLE SODIUM 40 MG PO TBEC: 40.0000 mg | DELAYED_RELEASE_TABLET | Freq: Every day | ORAL | 0 refills | Status: DC

## 2017-05-23 NOTE — Progress Notes (Signed)
Pt for discharge home. A/o. No resp distress, lipase  Down.  Instructions for discharge given  And discussed. presc given  Home meds discussed.  Diet/ activity and f/u discussed.  Sl d/cd. Pt waiting for wife to pick him up

## 2017-05-23 NOTE — Discharge Summary (Addendum)
[facility] NAME: Sergio [name]    MR#:  [id number]  DATE OF BIRTH:  [birth year]  DATE OF ADMISSION:  05/13/2017 ADMITTING PHYSICIAN: [name], MD  DATE OF DISCHARGE:  05/23/17  PRIMARY CARE PHYSICIAN: Crecencio Mc, MD    ADMISSION DIAGNOSIS:  Hypertriglyceridemia [E78.1] Abdominal pain [R10.9] Acute pancreatitis, unspecified complication status, unspecified pancreatitis type [K85.90]  DISCHARGE DIAGNOSIS:  Active Problems:   Acute pancreatitis   SECONDARY DIAGNOSIS:   Past Medical History:  Diagnosis Date  . Anxiety   . Chicken pox   . Depression   . Diabetes mellitus   . Familial hypertriglyceridemia    severe  . Gastric ulcer 2009  . HTN (hypertension)   . Morbid obesity ([place])    s/p bariatric sleeve surgery 01/2015  . Recurrent acute pancreatitis    secondary to hypertriglyceridemia   . Sleep apnea 1999   uses CPAP    HOSPITAL COURSE:   HPI Sergio [name]  is a 41 y.o. male with a known history of Insulin requiring diabetes mellitus, hypertension, history of familial hypertriglyceridemia, gastric ulcer and multiple other medical problems was admitted to the hospital with acute pancreatitis from hypertriglyceridemia in the past. Patient is presenting with severe epigastric abdominal pain radiating to the left upper quadrant associated with nausea and vomiting for the past 2-3 days which has been progressively getting worse. Patient was drinking alcohol but quit several months ago. Lipase is elevated abdominal ultrasound is pending stat triglyceride level is pending at this time. ED physician has discussed with Dr. Gabriel Carina and patient is started on insulin drip and D5 normal saline hospitalist team is called to admit the patient. Patient reports his epigastric abdominal pain is 9-10 out of 10 without pain medicine  # Acute epigastric abdominal pain secondary to acute pancreatitis from  hypertriglyceridemia Triglycerides are Better today-745  Clear liquid diet and advance as tolerated and discharge patient if he is tolerating diet Abdominal ultrasound is ordered, patient declined  # Hypertriglyceridemia  Following serial triglycerides, triglycerides 5038 [phone]  Discontinued insulin drip , dextrose infusion discontinued and resumed patient's insulin pump Patitent reports he does not drink alcohol.  Continue home medication Crestor, Lopid, Creon and Lovaza upon discharge.   #Insulin requiring diabetes mellitus Resume insulin pump - home med after stopping insulin drip Outpatient follow-up with Dr. Gabriel Carina endocrinologist  #Hyponatremia secondary to hyper lipidemia/dehydration Resolved Monitor sodium levels, 128-129--[phone]   #Depression with anxiety on Zoloft Will give  Xanax as needed  #Obstructive sleep apnea continue CPAP daily at bedtime  # Acute on chronic urinary retention-secondary to Dilaudid Discontinue Dilaudid Discontinue Foley catheter, voided prior to discharge   #Noncompliance According to endocrinology Dr. Gabriel Carina patient is noncompliant,reinforced the importance of being compliant with medications and follow-up appointments  DISCHARGE CONDITIONS:  fair   CONSULTS OBTAINED:     PROCEDURES  NONE   DRUG ALLERGIES:   Allergies  Allergen Reactions  . Codeine Anaphylaxis  . Ivp Dye [Iodinated Diagnostic Agents] Other (See Comments)    Kidneys stop working    DISCHARGE MEDICATIONS:   Current Discharge Medication List    START taking these medications   Details  docusate sodium (COLACE) 100 MG capsule Take 1 capsule (100 mg total) by mouth 2 (two) times daily as needed for mild constipation. Qty: 10 capsule, Refills: 0    Multiple Vitamin (MULTIVITAMIN WITH MINERALS) TABS tablet Take 1 tablet by mouth daily.    pantoprazole (PROTONIX)  40 MG tablet Take 1 tablet (40 mg total) by  mouth daily before breakfast. Qty: 30 tablet, Refills: 0      CONTINUE these medications which have CHANGED   Details  gemfibrozil (LOPID) 600 MG tablet Take 1 tablet (600 mg total) by mouth 2 (two) times daily before a meal. Qty: 60 tablet, Refills: 0    lipase/protease/amylase (CREON) 36000 UNITS CPEP capsule 2 tablets with meals and 1 tablet with snacks Qty: 90 capsule, Refills: 0    omega-3 acid ethyl esters (LOVAZA) 1 g capsule Take 2 capsules (2 g total) by mouth 2 (two) times daily. Qty: 60 capsule, Refills: 0    Oxycodone HCl 10 MG TABS Take 1 tablet (10 mg total) by mouth every 6 (six) hours as needed. Qty: 15 tablet, Refills: 0    rosuvastatin (CRESTOR) 10 MG tablet Take 1 tablet (10 mg total) by mouth daily at 6 PM. Qty: 30 tablet, Refills: 0      CONTINUE these medications which have NOT CHANGED   Details  ALPRAZolam (XANAX) 0.5 MG tablet Take 0.5 mg by mouth 3 (three) times daily as needed for anxiety.     finasteride (PROSCAR) 5 MG tablet Take 1 tablet (5 mg total) by mouth daily. Qty: 30 tablet, Refills: 0    insulin aspart (NOVOLOG) 100 UNIT/ML injection Inject 1 Dose into the skin See admin instructions. Use up to 120 units daily via insulin pump    pregabalin (LYRICA) 300 MG capsule Take 300 mg by mouth at bedtime.     sertraline (ZOLOFT) 100 MG tablet Take 1 tablet (100 mg total) by mouth daily. Qty: 90 tablet, Refills: 1    tamsulosin (FLOMAX) 0.4 MG CAPS capsule Take 1 capsule (0.4 mg total) by mouth daily. Qty: 30 capsule, Refills: 0    traZODone (DESYREL) 100 MG tablet Take 200 mg by mouth daily as needed for sleep.          DISCHARGE INSTRUCTIONS:   Follow-up with primary care physician in a week Follow-up with Dr. Drucie Opitz 3 days Follow-up with Dr. Fletcher Anon- hypertriglyceridemia Stop drinking alcohol    DIET:  Diabetic diet and  no fat diet  DISCHARGE CONDITION:  Stable  ACTIVITY:  Activity as tolerated  OXYGEN:  Home Oxygen: No.    Oxygen Delivery: room air  DISCHARGE LOCATION:  home   If you experience worsening of your admission symptoms, develop shortness of breath, life threatening emergency, suicidal or homicidal thoughts you must seek medical attention immediately by calling 911 or calling your MD immediately  if symptoms less severe.  You Must read complete instructions/literature along with all the possible adverse reactions/side effects for all the Medicines you take and that have been prescribed to you. Take any new Medicines after you have completely understood and accpet all the possible adverse reactions/side effects.   Please note  You were cared for by a hospitalist during your hospital stay. If you have any questions about your discharge medications or the care you received while you were in the hospital after you are discharged, you can call the unit and asked to speak with the hospitalist on call if the hospitalist that took care of you is not available. Once you are discharged, your primary care physician will handle any further medical issues. Please note that NO REFILLS for any discharge medications will be authorized once you are discharged, as it is imperative that you return to your primary care physician (or establish a relationship with a primary  care physician if you do not have one) for your aftercare needs so that they can reassess your need for medications and monitor your lab values.     Today  Chief Complaint  Patient presents with  . Abdominal Pain  . Nausea  . Emesis   Patient is doing fine. Denies any abdominal pain. Tolerating diet and wants to go home today. Wife at bedside   ROS:  CONSTITUTIONAL: Denies fevers, chills. Denies any fatigue, weakness.  EYES: Denies blurry vision, double vision, eye pain. EARS, NOSE, THROAT: Denies tinnitus, ear pain, hearing loss. RESPIRATORY: Denies cough, wheeze, shortness of breath.  CARDIOVASCULAR: Denies chest pain, palpitations, edema.   GASTROINTESTINAL: Denies nausea, vomiting, diarrhea, abdominal pain. Denies bright red blood per rectum. GENITOURINARY: Denies dysuria, hematuria. ENDOCRINE: Denies nocturia or thyroid problems. HEMATOLOGIC AND LYMPHATIC: Denies easy bruising or bleeding. SKIN: Denies rash or lesion. MUSCULOSKELETAL: Denies pain in neck, back, shoulder, knees, hips or arthritic symptoms.  NEUROLOGIC: Denies paralysis, paresthesias.  PSYCHIATRIC: Denies anxiety or depressive symptoms.   VITAL SIGNS:  Blood pressure 126/76, pulse 69, temperature (!) 97.4 F (36.3 C), temperature source Oral, resp. rate 18, height 5\' 11"  (1.803 m), weight 106.9 kg (235 lb 10.8 oz), SpO2 94 %.  I/O:    Intake/Output Summary (Last 24 hours) at 05/23/17 1619 Last data filed at 05/23/17 1352  Gross per 24 hour  Intake              714 ml  Output             2800 ml  Net            -2086 ml    PHYSICAL EXAMINATION:  GENERAL:  41 y.o.-year-old patient lying in the bed with no acute distress.  EYES: Pupils equal, round, reactive to light and accommodation. No scleral icterus. Extraocular muscles intact.  HEENT: Head atraumatic, normocephalic. Oropharynx and nasopharynx clear.  NECK:  Supple, no jugular venous distention. No thyroid enlargement, no tenderness.  LUNGS: Normal breath sounds bilaterally, no wheezing, rales,rhonchi or crepitation. No use of accessory muscles of respiration.  CARDIOVASCULAR: S1, S2 normal. No murmurs, rubs, or gallops.  ABDOMEN: Soft, non-tender, non-distended. Bowel sounds present. No organomegaly or mass.  EXTREMITIES: No pedal edema, cyanosis, or clubbing.  NEUROLOGIC: Cranial nerves II through XII are intact. Muscle strength 5/5 in all extremities. Sensation intact. Gait not checked.  PSYCHIATRIC: The patient is alert and oriented x 3.  SKIN: No obvious rash, lesion, or ulcer.   DATA REVIEW:   CBC  Recent Labs Lab 05/23/17 0444  WBC 4.6  HGB 14.3  HCT 42.9  PLT 176     Chemistries   Recent Labs Lab 05/20/17 0437 05/21/17 0501 05/23/17 0444  NA 138  --  137  K 3.7  --  3.6  CL 98*  --  101  CO2 32  --  28  GLUCOSE 82  --  80  BUN 7  --  10  CREATININE 0.86  0.84  --  0.90  CALCIUM 9.2  --  9.0  MG  --  1.9  --   AST 20  --   --   ALT 19  --   --   ALKPHOS 65  --   --   BILITOT 0.5  --   --     Cardiac Enzymes No results for input(s): TROPONINI in the last 168 hours.  Microbiology Results  Results for orders placed or performed during the hospital encounter  of 05/13/17  MRSA PCR Screening     Status: None   Collection Time: 05/13/17  3:37 PM  Result Value Ref Range Status   MRSA by PCR NEGATIVE NEGATIVE Final    Comment:        The GeneXpert MRSA Assay (FDA approved for NASAL specimens only), is one component of a comprehensive MRSA colonization surveillance program. It is not intended to diagnose MRSA infection nor to guide or monitor treatment for MRSA infections.     RADIOLOGY:  No results found.  EKG:   Orders placed or performed during the hospital encounter of 05/13/17  . EKG 12-Lead  . EKG 12-Lead      Management plans discussed with the patient, family and they are in agreement.  CODE STATUS:     Code Status Orders        Start     Ordered   05/13/17 1524  Full code  Continuous     05/13/17 1524    Code Status History    Date Active Date Inactive Code Status Order ID Comments User Context   01/28/2017  8:40 AM 02/03/2017  3:43 PM Full Code [id number]  Harrie Foreman, MD Inpatient   07/24/2016 10:51 PM 08/04/2016  6:37 PM Full Code [id number]  Theodoro Grist, MD Inpatient   03/11/2016  4:58 AM 03/19/2016  4:44 PM Full Code [id number]  Harrie Foreman, MD Inpatient   01/17/2016  2:50 AM 01/27/2016  5:47 PM Full Code [id number]  Harrie Foreman, MD Inpatient   08/01/2015 11:35 PM 08/15/2015  4:43 PM Full Code [id number]  Lytle Butte, MD ED   07/21/2014 11:46 AM 07/27/2014  6:16 PM Full Code  [id number]  Oswald Hillock, MD Inpatient      TOTAL TIME TAKING CARE OF THIS PATIENT: 45  minutes.   Note: This dictation was prepared with Dragon dictation along with smaller phrase technology. Any transcriptional errors that result from this process are unintentional.   @[facility] @  on 05/23/2017 at 4:19 PM  Between 7am to 6pm - Pager - [phone]  After 6pm go to [email or web address] - password [email or web address] [facility]  Office  [phone]  CC: Primary care physician; Crecencio Mc, MD

## 2017-05-23 NOTE — Progress Notes (Signed)
Foley out this am  Pt  Voided 400 ml  In urinal. No blood noted in urine clear and  yellow

## 2017-05-23 NOTE — Progress Notes (Signed)
Inpatient Diabetes Program Recommendations  AACE/ADA: New Consensus Statement on Inpatient Glycemic Control (2015)  Target Ranges:  Prepandial:   less than 140 mg/dL      Peak postprandial:   less than 180 mg/dL (1-2 hours)      Critically ill patients:  140 - 180 mg/dL    Results for Sergio [name], Sergio [name] (MRN [id number]) as of 05/23/2017 13:35  Ref. Range 05/22/2017 23:50 05/23/2017 04:06 05/23/2017 07:34 05/23/2017 11:56  Glucose-Capillary Latest Ref Range: 65 - 99 mg/dL 131 (H) 82 87 73   Home DM Meds: Insulin Pump  Current Insulin Orders: Insulin Pump Q4 hours      Note patient transitioned off IV Insulin drip to home Insulin Pump last evening.  Met with patient this afternoon.  Patient A&O and able to manage insulin pump independently.  See below for current Insulin Pump settings.  CBGs stable since Midnight.  Has extra pump supplies and can get more from home by family if needed.  --Insulin Pump Settings--  Basal Rates: 2.6 units/hour all day  Total Basal Insulin per 24 hours period= 62.4 units  Carbohydrate Ratio:  12pm- 1 unit for every 3 Grams of Carbohydrates 3pm- 1 unit for every 3 Grams of Carbohydrates 12am-1 unit for every 2 Grams of Carbohydrates  Correction/Sensitivity Factor:  12am- 1 unit for every 19 mg/dl above Target CBG 4pm- 1 unit for every 17 mg/dl above Target CBG   Target CBG: 150 mg/dl     --Will follow patient during hospitalization--  Wyn Quaker RN, MSN, CDE Diabetes Coordinator Inpatient Glycemic Control Team Team Pager: [phone] (8a-5p)

## 2017-05-23 NOTE — Discharge Instructions (Signed)
Follow-up with primary care physician in a week Follow-up with Dr. Drucie Opitz 3 days Follow-up with Dr. Fletcher Anon- hypertriglyceridemia Stop drinking alcohol

## 2017-05-26 ENCOUNTER — Encounter: Payer: Self-pay | Admitting: Internal Medicine

## 2017-05-26 ENCOUNTER — Ambulatory Visit (INDEPENDENT_AMBULATORY_CARE_PROVIDER_SITE_OTHER): Payer: BLUE CROSS/BLUE SHIELD | Admitting: Internal Medicine

## 2017-05-26 ENCOUNTER — Telehealth: Payer: Self-pay | Admitting: Internal Medicine

## 2017-05-26 DIAGNOSIS — K861 Other chronic pancreatitis: Secondary | ICD-10-CM

## 2017-05-26 DIAGNOSIS — K8689 Other specified diseases of pancreas: Secondary | ICD-10-CM | POA: Diagnosis not present

## 2017-05-26 DIAGNOSIS — IMO0002 Reserved for concepts with insufficient information to code with codable children: Secondary | ICD-10-CM

## 2017-05-26 DIAGNOSIS — E1165 Type 2 diabetes mellitus with hyperglycemia: Secondary | ICD-10-CM

## 2017-05-26 DIAGNOSIS — Z794 Long term (current) use of insulin: Secondary | ICD-10-CM

## 2017-05-26 DIAGNOSIS — E1144 Type 2 diabetes mellitus with diabetic amyotrophy: Secondary | ICD-10-CM

## 2017-05-26 DIAGNOSIS — F411 Generalized anxiety disorder: Secondary | ICD-10-CM

## 2017-05-26 DIAGNOSIS — K859 Acute pancreatitis without necrosis or infection, unspecified: Secondary | ICD-10-CM

## 2017-05-26 MED ORDER — ALPRAZOLAM 0.5 MG PO TABS: 0.5000 mg | ORAL_TABLET | Freq: Every evening | ORAL | 5 refills | Status: DC | PRN

## 2017-05-26 MED ORDER — PANCRELIPASE (LIP-PROT-AMYL) 36000-114000 UNITS PO CPEP: ORAL_CAPSULE | ORAL | 0 refills | Status: DC

## 2017-05-26 MED ORDER — TRAZODONE HCL 100 MG PO TABS: 200.0000 mg | ORAL_TABLET | Freq: Every day | ORAL | 1 refills | Status: DC | PRN

## 2017-05-26 NOTE — Telephone Encounter (Signed)
Key: [id number]) Pa started on cover my meds for Creon awaiting decision from insurance.

## 2017-05-26 NOTE — Patient Instructions (Addendum)
Stay on the trazodone  (take it one hour before bedtime)  for sleep,    Try to wean  off daily use of alprazolam    Get yourself a watch with a timer so your insulin pump can be maintained  Regularly

## 2017-05-26 NOTE — Assessment & Plan Note (Addendum)
With insomnia , managed with trazodone  And prn alprazolam. Advised to increase trazodone prn and wean off alprazolam

## 2017-05-26 NOTE — Progress Notes (Signed)
Subjective:  Patient ID: Sergio [name], male    DOB: [birth year]  Age: 41 y.o. 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  HPI [name] presents for hospital follow up .  Was admitted to [facility] August 3 with pancreatitis secondary to hypertriglyceridemia. (lipase 246,  trigs 5000)  Was admitted to ICU for management with insulin gtt .  triglcyerides were reduced to 745 by time of discharge.  His pain was managed with narcotics and  he was discharged home with rx for oxycodone #15 tablets.  He is using them sparingly and trying to avoid using them at all.   Had states that he had been compliant with gemfibrozil,  But admits to noncompliance  with insulin pump.  He really can't explain why he let his pump run out. His wife is aggravated at his failure to manage his medications responsibly.   She was gven the [employer] glucose monitoring system yesterday by [name] but has not ben able to purchase it yet due to cost.     Was given Creon for pancreatic enzyme supplementation during  His hospitalization , but has not been able to use it as an outpatient. bc the OOP cost is $2000.  STATES THAT HIS CURRENT LIPASE ENZYME IS NOT WORKING BUT DOES NOT KNOW THAT NAME or what dose. .     Outpatient Medications Prior to Visit  Medication Sig Dispense Refill  . finasteride (PROSCAR) 5 MG tablet Take 1 tablet (5 mg total) by mouth daily. (Patient taking differently: Take 5 mg by mouth at bedtime. ) 30 tablet 0  . gemfibrozil (LOPID) 600 MG tablet Take 1 tablet (600 mg total) by mouth 2 (two) times daily before a meal. 60 tablet 0  . insulin aspart (NOVOLOG) 100 UNIT/ML injection Inject 1 Dose into the skin See admin instructions. Use up to 120 units daily via  insulin pump    . omega-3 acid ethyl esters (LOVAZA) 1 g capsule Take 2 capsules (2 g total) by mouth 2 (two) times daily. 60 capsule 0  . Oxycodone HCl 10 MG TABS Take 1 tablet (10 mg total) by mouth every 6 (six) hours as needed. 15 tablet 0  . pregabalin (LYRICA) 300 MG capsule Take 300 mg by mouth at bedtime.     . rosuvastatin (CRESTOR) 10 MG tablet Take 1 tablet (10 mg total) by mouth daily at 6 PM. 30 tablet 0  . sertraline (ZOLOFT) 100 MG tablet Take 1 tablet (100 mg total) by mouth daily. 90 tablet 1  . tamsulosin (FLOMAX) 0.4 MG CAPS capsule Take 1 capsule (0.4 mg total) by mouth daily. (Patient taking differently: Take 0.4 mg by mouth at bedtime. ) 30 capsule 0  . ALPRAZolam (XANAX) 0.5 MG tablet Take 0.5 mg by mouth 3 (three) times daily as needed for anxiety.     . lipase/protease/amylase (CREON) 36000 UNITS CPEP capsule 2 tablets with meals and 1 tablet with snacks 90 capsule 0  . traZODone (DESYREL) 100 MG tablet Take 200 mg by mouth daily as needed for sleep.     [name] docusate sodium (COLACE) 100 MG capsule Take 1 capsule (100 mg total) by mouth 2 (two) times daily as needed for mild constipation. (Patient not taking: Reported on 05/26/2017) 10 capsule 0  . Multiple Vitamin (MULTIVITAMIN  WITH MINERALS) TABS tablet Take 1 tablet by mouth daily. (Patient not taking: Reported on 05/26/2017)    . pantoprazole (PROTONIX) 40 MG tablet Take 1 tablet (40 mg total) by mouth daily before breakfast. (Patient not taking: Reported on 05/26/2017) 30 tablet 0   No facility-administered medications prior to visit.     Review of Systems;  Patient denies headache, fevers, malaise, unintentional weight loss, skin rash, eye pain, sinus congestion and sinus pain, sore throat, dysphagia,  hemoptysis , cough, dyspnea, wheezing, chest pain, palpitations, orthopnea, edema, abdominal pain, nausea, melena, diarrhea, constipation, flank pain, dysuria, hematuria, urinary  Frequency, nocturia, numbness, tingling,  seizures,  Focal weakness, Loss of consciousness,  Tremor, insomnia, depression, anxiety, and suicidal ideation.      Objective:  BP 112/74 (BP Location: Left Arm, Patient Position: Sitting, Cuff Size: Large)   Pulse 81   Temp 97.8 F (36.6 C) (Oral)   Resp 15   Ht 5\' 11"  (1.803 m)   Wt 231 lb 12.8 oz (105.1 kg)   SpO2 97%   BMI 32.33 kg/m   BP Readings from Last 3 Encounters:  05/26/17 112/74  05/23/17 126/76  02/03/17 (!) 116/59    Wt Readings from Last 3 Encounters:  05/26/17 231 lb 12.8 oz (105.1 kg)  05/13/17 235 lb 10.8 oz (106.9 kg)  02/03/17 249 lb 14.4 oz (113.4 kg)    General appearance: alert, cooperative and appears stated age Ears: normal TM's and external ear canals both ears Throat: lips, mucosa, and tongue normal; teeth and gums normal Neck: no adenopathy, no carotid bruit, supple, symmetrical, trachea midline and thyroid not enlarged, symmetric, no tenderness/mass/nodules Back: symmetric, no curvature. ROM normal. No CVA tenderness. Lungs: clear to auscultation bilaterally Heart: regular rate and rhythm, S1, S2 normal, no murmur, click, rub or gallop Abdomen: soft, non-tender; bowel sounds normal; no masses,  no organomegaly Pulses: 2+ and symmetric Skin: Skin color, texture, turgor normal. No rashes or lesions Lymph nodes: Cervical, supraclavicular, and axillary nodes normal.  Lab Results  Component Value Date   HGBA1C 10.3 (H) 01/28/2017   HGBA1C 7.9 (H) 07/24/2016   HGBA1C 8.6 (H) 03/11/2016    Lab Results  Component Value Date   CREATININE 0.90 05/23/2017   CREATININE 0.84 05/20/2017   CREATININE 0.86 05/20/2017    Lab Results  Component Value Date   WBC 4.6 05/23/2017   HGB 14.3 05/23/2017   HCT 42.9 05/23/2017   PLT 176 05/23/2017   GLUCOSE 80 05/23/2017   CHOL 597 (H) 05/14/2017   TRIG 745 (H) 05/23/2017   HDL NOT REPORTED DUE TO HIGH TRIGLYCERIDES 05/14/2017   LDLDIRECT 111.4 06/30/2012   LDLCALC UNABLE TO CALCULATE IF  TRIGLYCERIDE OVER 400 mg/dL 05/14/2017   ALT 19 05/20/2017   AST 20 05/20/2017   NA 137 05/23/2017   K 3.6 05/23/2017   CL 101 05/23/2017   CREATININE 0.90 05/23/2017   BUN 10 05/23/2017   CO2 28 05/23/2017   TSH 5.002 (H) 01/28/2017   INR 1.0 11/04/2014   HGBA1C 10.3 (H) 01/28/2017    No results found.  Assessment & Plan:   Problem List Items Addressed This Visit    DM (diabetes mellitus), type 2, uncontrolled w/neurologic complication ([place])    Managed by [name] . Patient now has an insulin pumps but has not been keeping it filled.        Generalized anxiety disorder    With insomnia , managed with trazodone  And prn alprazolam. Advised to increase  trazodone prn and wean off alprazolam       Pancreatic insufficiency    Secondary to recurrent acute pancreatitis due to hypertriglyceridemia. Prefers Creon for lipase supplement,  But insurance denied coverage even with [place] and has mandated use of ZenPep.  .  The medications are identical; sending in an rx for generic pancrelipase at [place] 56861 units per meal,  36000 units per snack       Recurrent pancreatitis ([place])    Secondary to hypertriglyceridemia secondary to medication noncompliance caused by  Insulin noncompliance. His pain is improving and he is using his insulin pump       Relevant Medications   lipase/protease/amylase (CREON) 36000 UNITS CPEP capsule      I have discontinued Mr. Elk docusate sodium, lipase/protease/amylase, multivitamin with minerals, pantoprazole, and lipase/protease/amylase. I have also changed his ALPRAZolam and traZODone. Additionally, I am having him start on lipase/protease/amylase. Lastly, I am having him maintain his pregabalin, insulin aspart, finasteride, tamsulosin, sertraline, gemfibrozil, omega-3 acid ethyl esters, rosuvastatin, and Oxycodone HCl.  Meds ordered this encounter  Medications  . ALPRAZolam (XANAX) 0.5 MG tablet    Sig: Take 1 tablet (0.5 mg total) by mouth at  bedtime as needed for sleep.    Dispense:  30 tablet    Refill:  5  . traZODone (DESYREL) 100 MG tablet    Sig: Take 2 tablets (200 mg total) by mouth daily as needed for sleep.    Dispense:  180 tablet    Refill:  1    KEEP ON FILE FOR FUTURE REFILLS  . DISCONTD: lipase/protease/amylase (CREON) 36000 UNITS CPEP capsule    Sig: 2 tablets with meals and 1 tablet with snacks    Dispense:  720 capsule    Refill:  0    90 DAY SUPPLY,  INCLUDES 2 SNACKS DAILY  . lipase/protease/amylase (CREON) 36000 UNITS CPEP capsule    Sig: Take 2 capsules (72,000 Units total) by mouth 3 (three) times daily before meals. And 1 capsule with each snack    Dispense:  240 capsule    Refill:  2    Medications Discontinued During This Encounter  Medication Reason  . docusate sodium (COLACE) 100 MG capsule Patient has not taken in last 30 days  . Multiple Vitamin (MULTIVITAMIN WITH MINERALS) TABS tablet Patient has not taken in last 30 days  . pantoprazole (PROTONIX) 40 MG tablet Patient has not taken in last 30 days  . ALPRAZolam (XANAX) 0.5 MG tablet Reorder  . traZODone (DESYREL) 100 MG tablet Reorder  . lipase/protease/amylase (CREON) 36000 UNITS CPEP capsule Reorder  . lipase/protease/amylase (CREON) 36000 UNITS CPEP capsule     Follow-up: Return in about 6 months (around 11/26/2017).   Crecencio Mc, MD

## 2017-05-27 MED ORDER — PANCRELIPASE (LIP-PROT-AMYL) 36000-114000 UNITS PO CPEP: 72000.0000 [IU] | ORAL_CAPSULE | Freq: Three times a day (TID) | ORAL | 2 refills | Status: DC

## 2017-05-27 NOTE — Assessment & Plan Note (Addendum)
Secondary to recurrent acute pancreatitis due to hypertriglyceridemia. Prefers Creon for lipase supplement,  But insurance denied coverage even with [place] and has mandated use of ZenPep.  .  The medications are identical; sending in an rx for generic pancrelipase at [place] dose of 31540 units per meal,  36000 units per snack

## 2017-05-29 ENCOUNTER — Encounter: Payer: Self-pay | Admitting: Internal Medicine

## 2017-05-29 NOTE — Assessment & Plan Note (Signed)
Secondary to hypertriglyceridemia secondary to medication noncompliance caused by  Insulin noncompliance. His pain is improving and he is using his insulin pump

## 2017-05-29 NOTE — Assessment & Plan Note (Signed)
Managed by [name] . Patient now has an insulin pumps but has not been keeping it filled.

## 2017-06-02 MED ORDER — PANCRELIPASE (LIP-PROT-AMYL) 40000-126000 UNITS PO CPEP: 1.0000 | ORAL_CAPSULE | Freq: Every day | ORAL | 2 refills | Status: DC

## 2017-06-02 NOTE — Telephone Encounter (Signed)
Patient insurance will not cover the Creon he will have to try the alternative for Creon which is Zenpep ?

## 2017-06-02 NOTE — Telephone Encounter (Signed)
Left message to return call to office.

## 2017-06-02 NOTE — Telephone Encounter (Signed)
Highest dose ZenPEP sent. Please let patient know that Creon and Zen Pep differ only in strength so this higher dose may  work

## 2017-06-02 NOTE — Telephone Encounter (Signed)
Pt called back and stated that the insurance denied this and that they want him to use another medication. He cannot remember the name but it starts with an A. Please advise, thank you!  Call pt @ [phone]

## 2017-06-02 NOTE — Addendum Note (Signed)
Addended by: Crecencio Mc on: 06/02/2017 12:16 PM   Modules accepted: Orders

## 2017-06-07 NOTE — Telephone Encounter (Signed)
Patient notified and voiced understanding.

## 2017-06-09 ENCOUNTER — Ambulatory Visit: Payer: BLUE CROSS/BLUE SHIELD | Admitting: Physician Assistant

## 2017-07-06 NOTE — Telephone Encounter (Signed)
Orders

## 2017-09-08 ENCOUNTER — Telehealth: Payer: Self-pay | Admitting: Internal Medicine

## 2017-09-08 ENCOUNTER — Other Ambulatory Visit: Payer: Self-pay | Admitting: Radiology

## 2017-09-08 MED ORDER — AMOXICILLIN-POT CLAVULANATE 875-125 MG PO TABS: 1.0000 | ORAL_TABLET | Freq: Two times a day (BID) | ORAL | 0 refills | Status: DC

## 2017-09-08 MED ORDER — PREDNISONE 10 MG PO TABS: ORAL_TABLET | ORAL | 0 refills | Status: DC

## 2017-09-08 NOTE — Telephone Encounter (Signed)
Copied from [place] [phone]. Topic: General - Other >> Sep 08, 2017 11:02 AM Cecelia Byars, NT wrote: Reason for CRM: Patient says he has a sinus infection he gets 2 a year would like something called in please call him [phone] would like to speak to a nurse was unable to hold .

## 2017-09-08 NOTE — Addendum Note (Signed)
Addended by: Crecencio Mc on: 09/08/2017 03:03 PM   Modules accepted: Orders

## 2017-09-08 NOTE — Telephone Encounter (Signed)
augmentin  And prednisone taper sent to pharmacy

## 2017-09-08 NOTE — Telephone Encounter (Signed)
Returned pt.'s call in regard to "sinus infection". Has an appointment for 09/12/17. Does not want to be seen in another office.Request antibiotic be called in. Informed pt. He would need to be seen. States "Dr.Tullo knows me and will call something in."

## 2017-09-08 NOTE — Telephone Encounter (Signed)
Patient says his teeth are hurting, pressure behind eyes, post nasal drip, facial/ear pain, sore throat from drainage. Symptoms started around 2 weeks ago. Patient has tried Sudafed 12 hr and it does help for a short period of time but symptoms return. Patient is not running a fever and is does not have a cough. Patient stated that he gets 2 sinus infections every year and Dr. Derrel Nip calls in what he believes in Augmentin.

## 2017-09-08 NOTE — Telephone Encounter (Signed)
Patient is aware 

## 2017-09-08 NOTE — Telephone Encounter (Signed)
Already addressed by Dr Derrel Nip.

## 2017-09-08 NOTE — Telephone Encounter (Signed)
Copied from [place] [phone]. Topic: General - Other >> Sep 08, 2017 11:02 AM Cecelia Byars, NT wrote: Reason for CRM: Patient says he has a sinus infection he gets 2 a year would like something called in please call him [phone] would like to speak to a nurse was unable to hold .has an appointment for Monday 09/12/17

## 2017-09-12 ENCOUNTER — Ambulatory Visit: Payer: Self-pay | Admitting: Family Medicine

## 2017-10-16 ENCOUNTER — Inpatient Hospital Stay
Admission: EM | Admit: 2017-10-16 | Discharge: 2017-10-23 | DRG: 440 | Disposition: A | Discharge status: Home / Self Care | Payer: BLUE CROSS/BLUE SHIELD | Attending: Internal Medicine | Admitting: Internal Medicine

## 2017-10-16 ENCOUNTER — Encounter: Payer: Self-pay | Admitting: Emergency Medicine

## 2017-10-16 ENCOUNTER — Other Ambulatory Visit: Payer: Self-pay

## 2017-10-16 DIAGNOSIS — Z8349 Family history of other endocrine, nutritional and metabolic diseases: Secondary | ICD-10-CM | POA: Diagnosis not present

## 2017-10-16 DIAGNOSIS — G4733 Obstructive sleep apnea (adult) (pediatric): Secondary | ICD-10-CM | POA: Diagnosis present

## 2017-10-16 DIAGNOSIS — Z9641 Presence of insulin pump (external) (internal): Secondary | ICD-10-CM | POA: Diagnosis present

## 2017-10-16 DIAGNOSIS — E876 Hypokalemia: Secondary | ICD-10-CM | POA: Diagnosis present

## 2017-10-16 DIAGNOSIS — Z6835 Body mass index (BMI) 35.0-35.9, adult: Secondary | ICD-10-CM

## 2017-10-16 DIAGNOSIS — Z23 Encounter for immunization: Secondary | ICD-10-CM | POA: Diagnosis not present

## 2017-10-16 DIAGNOSIS — E781 Pure hyperglyceridemia: Secondary | ICD-10-CM | POA: Diagnosis present

## 2017-10-16 DIAGNOSIS — Z8249 Family history of ischemic heart disease and other diseases of the circulatory system: Secondary | ICD-10-CM

## 2017-10-16 DIAGNOSIS — K858 Other acute pancreatitis without necrosis or infection: Secondary | ICD-10-CM | POA: Diagnosis present

## 2017-10-16 DIAGNOSIS — I10 Essential (primary) hypertension: Secondary | ICD-10-CM | POA: Diagnosis present

## 2017-10-16 DIAGNOSIS — N400 Enlarged prostate without lower urinary tract symptoms: Secondary | ICD-10-CM | POA: Diagnosis present

## 2017-10-16 DIAGNOSIS — K8500 Idiopathic acute pancreatitis without necrosis or infection: Secondary | ICD-10-CM | POA: Diagnosis not present

## 2017-10-16 DIAGNOSIS — E11649 Type 2 diabetes mellitus with hypoglycemia without coma: Secondary | ICD-10-CM | POA: Diagnosis not present

## 2017-10-16 DIAGNOSIS — K861 Other chronic pancreatitis: Secondary | ICD-10-CM | POA: Diagnosis present

## 2017-10-16 DIAGNOSIS — Z885 Allergy status to narcotic agent status: Secondary | ICD-10-CM | POA: Diagnosis not present

## 2017-10-16 DIAGNOSIS — F419 Anxiety disorder, unspecified: Secondary | ICD-10-CM | POA: Diagnosis present

## 2017-10-16 DIAGNOSIS — F329 Major depressive disorder, single episode, unspecified: Secondary | ICD-10-CM | POA: Diagnosis present

## 2017-10-16 DIAGNOSIS — K5900 Constipation, unspecified: Secondary | ICD-10-CM | POA: Diagnosis present

## 2017-10-16 DIAGNOSIS — Z8711 Personal history of peptic ulcer disease: Secondary | ICD-10-CM | POA: Diagnosis not present

## 2017-10-16 DIAGNOSIS — K859 Acute pancreatitis without necrosis or infection, unspecified: Secondary | ICD-10-CM

## 2017-10-16 DIAGNOSIS — Z9884 Bariatric surgery status: Secondary | ICD-10-CM

## 2017-10-16 DIAGNOSIS — R339 Retention of urine, unspecified: Secondary | ICD-10-CM | POA: Diagnosis not present

## 2017-10-16 LAB — GLUCOSE, CAPILLARY
GLUCOSE-CAPILLARY: 135 mg/dL — AB (ref 65–99)
GLUCOSE-CAPILLARY: 108 mg/dL — AB (ref 65–99)
GLUCOSE-CAPILLARY: 82 mg/dL (ref 65–99)
GLUCOSE-CAPILLARY: 72 mg/dL (ref 65–99)
GLUCOSE-CAPILLARY: 78 mg/dL (ref 65–99)
GLUCOSE-CAPILLARY: 77 mg/dL (ref 65–99)
Glucose-Capillary: 159 mg/dL — ABNORMAL HIGH (ref 65–99)
Glucose-Capillary: 123 mg/dL — ABNORMAL HIGH (ref 65–99)
Glucose-Capillary: 107 mg/dL — ABNORMAL HIGH (ref 65–99)
Glucose-Capillary: 91 mg/dL (ref 65–99)
Glucose-Capillary: 76 mg/dL (ref 65–99)
Glucose-Capillary: 76 mg/dL (ref 65–99)
Glucose-Capillary: 80 mg/dL (ref 65–99)
Glucose-Capillary: 79 mg/dL (ref 65–99)
Glucose-Capillary: 81 mg/dL (ref 65–99)

## 2017-10-16 LAB — COMPREHENSIVE METABOLIC PANEL
ALBUMIN: 4.6 g/dL (ref 3.5–5.0)
ALK PHOS: 48 U/L (ref 38–126)
ALT: 26 U/L (ref 17–63)
ANION GAP: 14 (ref 5–15)
AST: 36 U/L (ref 15–41)
BILIRUBIN TOTAL: 1.6 mg/dL — AB (ref 0.3–1.2)
BUN: 11 mg/dL (ref 6–20)
CALCIUM: 9.2 mg/dL (ref 8.9–10.3)
CO2: 20 mmol/L — ABNORMAL LOW (ref 22–32)
Chloride: 104 mmol/L (ref 101–111)
Creatinine, Ser: 0.79 mg/dL (ref 0.61–1.24)
GFR calc Af Amer: >60 mL/min (ref >60)
Glucose, Bld: 182 mg/dL — ABNORMAL HIGH (ref 65–99)
POTASSIUM: 5 mmol/L (ref 3.5–5.1)
Sodium: 138 mmol/L (ref 135–145)
TOTAL PROTEIN: 7.5 g/dL (ref 6.5–8.1)

## 2017-10-16 LAB — CBC
HEMATOCRIT: 42.3 % (ref 40.0–52.0)
Hemoglobin: 14.5 g/dL (ref 13.0–18.0)
MCH: 27.7 pg (ref 26.0–34.0)
MCHC: 34.3 g/dL (ref 32.0–36.0)
MCV: 80.8 fL (ref 80.0–100.0)
Platelets: 218 10*3/uL (ref 150–440)
RBC: 5.23 MIL/uL (ref 4.40–5.90)
RDW: 15.5 % — AB (ref 11.5–14.5)
WBC: 5.6 10*3/uL (ref 3.8–10.6)

## 2017-10-16 LAB — MRSA PCR SCREENING: MRSA BY PCR: NEGATIVE

## 2017-10-16 LAB — TRIGLYCERIDES: Triglycerides: 4990 mg/dL — ABNORMAL HIGH (ref <150)

## 2017-10-16 LAB — LIPASE, BLOOD: Lipase: 22 U/L (ref 11–51)

## 2017-10-16 MED ORDER — HYDROMORPHONE HCL 1 MG/ML IJ SOLN
INTRAMUSCULAR | Status: AC
  Administered 2017-10-16: 1 mg via INTRAVENOUS
  Filled 2017-10-16: qty 1

## 2017-10-16 MED ORDER — GEMFIBROZIL 600 MG PO TABS
600.0000 mg | ORAL_TABLET | Freq: Two times a day (BID) | ORAL | Status: DC
  Administered 2017-10-16 – 2017-10-23 (×14): 600 mg via ORAL
  Filled 2017-10-16 (×15): qty 1

## 2017-10-16 MED ORDER — ONDANSETRON 4 MG PO TBDP
8.0000 mg | ORAL_TABLET | Freq: Once | ORAL | Status: AC
  Administered 2017-10-16: 8 mg via ORAL
  Filled 2017-10-16: qty 2

## 2017-10-16 MED ORDER — HYDROMORPHONE HCL 2 MG PO TABS
4.0000 mg | ORAL_TABLET | Freq: Once | ORAL | Status: AC
  Administered 2017-10-16: 4 mg via ORAL
  Filled 2017-10-16: qty 2

## 2017-10-16 MED ORDER — PREGABALIN 75 MG PO CAPS
300.0000 mg | ORAL_CAPSULE | Freq: Every day | ORAL | Status: DC
  Administered 2017-10-16 – 2017-10-22 (×7): 300 mg via ORAL
  Filled 2017-10-16 (×7): qty 4

## 2017-10-16 MED ORDER — SODIUM CHLORIDE 0.9 % IV SOLN
INTRAVENOUS | Status: DC
  Administered 2017-10-16: 17:00:00 via INTRAVENOUS
  Administered 2017-10-16 – 2017-10-17 (×3): 10 [IU]/h via INTRAVENOUS
  Administered 2017-10-18: 02:00:00 via INTRAVENOUS
  Administered 2017-10-18: 8 [IU]/h via INTRAVENOUS
  Administered 2017-10-19: 10 [IU]/h via INTRAVENOUS
  Administered 2017-10-19: 8 [IU]/h via INTRAVENOUS
  Administered 2017-10-20 – 2017-10-21 (×2): 10 [IU]/h via INTRAVENOUS
  Administered 2017-10-21: 21:00:00 via INTRAVENOUS
  Filled 2017-10-16 (×14): qty 1

## 2017-10-16 MED ORDER — HYDROMORPHONE HCL 1 MG/ML IJ SOLN
1.0000 mg | INTRAMUSCULAR | Status: DC | PRN
  Administered 2017-10-16 – 2017-10-17 (×7): 1 mg via INTRAVENOUS
  Filled 2017-10-16 (×7): qty 1

## 2017-10-16 MED ORDER — ONDANSETRON 4 MG PO TBDP
4.0000 mg | ORAL_TABLET | Freq: Once | ORAL | Status: AC | PRN
  Administered 2017-10-16: 4 mg via ORAL
  Filled 2017-10-16: qty 1

## 2017-10-16 MED ORDER — ONDANSETRON HCL 4 MG/2ML IJ SOLN
4.0000 mg | Freq: Four times a day (QID) | INTRAMUSCULAR | Status: DC | PRN
  Administered 2017-10-17 – 2017-10-23 (×14): 4 mg via INTRAVENOUS
  Filled 2017-10-16 (×14): qty 2

## 2017-10-16 MED ORDER — TRAZODONE HCL 100 MG PO TABS
200.0000 mg | ORAL_TABLET | Freq: Every day | ORAL | Status: DC
  Administered 2017-10-16: 100 mg via ORAL
  Administered 2017-10-17: 200 mg via ORAL
  Administered 2017-10-18: 100 mg via ORAL
  Filled 2017-10-16 (×4): qty 2

## 2017-10-16 MED ORDER — PNEUMOCOCCAL VAC POLYVALENT 25 MCG/0.5ML IJ INJ
0.5000 mL | INJECTION | INTRAMUSCULAR | Status: AC
  Administered 2017-10-17: 0.5 mL via INTRAMUSCULAR
  Filled 2017-10-16: qty 0.5

## 2017-10-16 MED ORDER — HALOPERIDOL LACTATE 5 MG/ML IJ SOLN
2.5000 mg | Freq: Once | INTRAMUSCULAR | Status: AC
  Administered 2017-10-16: 2.5 mg via INTRAVENOUS

## 2017-10-16 MED ORDER — ACETAMINOPHEN 650 MG RE SUPP: 650.0000 mg | Freq: Four times a day (QID) | RECTAL | Status: DC | PRN

## 2017-10-16 MED ORDER — ORAL CARE MOUTH RINSE
15.0000 mL | Freq: Two times a day (BID) | OROMUCOSAL | Status: DC
  Administered 2017-10-17: 15 mL via OROMUCOSAL

## 2017-10-16 MED ORDER — DEXTROSE 5 % AND 0.45 % NACL IV BOLUS
1000.0000 mL | Freq: Once | INTRAVENOUS | Status: AC
  Administered 2017-10-16: 1000 mL via INTRAVENOUS
  Filled 2017-10-16: qty 1000

## 2017-10-16 MED ORDER — OXYCODONE-ACETAMINOPHEN 5-325 MG PO TABS
1.0000 | ORAL_TABLET | ORAL | Status: AC | PRN
  Administered 2017-10-16 – 2017-10-18 (×2): 1 via ORAL
  Filled 2017-10-16 (×3): qty 1

## 2017-10-16 MED ORDER — OMEGA-3-ACID ETHYL ESTERS 1 G PO CAPS
2.0000 g | ORAL_CAPSULE | Freq: Two times a day (BID) | ORAL | Status: DC
  Administered 2017-10-16: 2 g via ORAL
  Filled 2017-10-16 (×2): qty 2

## 2017-10-16 MED ORDER — HALOPERIDOL LACTATE 5 MG/ML IJ SOLN
INTRAMUSCULAR | Status: AC
  Filled 2017-10-16: qty 1

## 2017-10-16 MED ORDER — DEXTROSE-NACL 5-0.9 % IV SOLN
INTRAVENOUS | Status: DC
  Administered 2017-10-16 – 2017-10-17 (×3): via INTRAVENOUS

## 2017-10-16 MED ORDER — ACETAMINOPHEN 325 MG PO TABS: 650.0000 mg | ORAL_TABLET | Freq: Four times a day (QID) | ORAL | Status: DC | PRN

## 2017-10-16 MED ORDER — DEXTROSE-NACL 5-0.2 % IV SOLN: INTRAVENOUS | Status: DC

## 2017-10-16 MED ORDER — ONDANSETRON HCL 4 MG PO TABS: 4.0000 mg | ORAL_TABLET | Freq: Four times a day (QID) | ORAL | Status: DC | PRN

## 2017-10-16 MED ORDER — HYDROMORPHONE HCL 1 MG/ML IJ SOLN
1.0000 mg | Freq: Once | INTRAMUSCULAR | Status: AC
  Administered 2017-10-16: 1 mg via INTRAVENOUS

## 2017-10-16 NOTE — Progress Notes (Signed)
Patient ID: Sergio [name], male   DOB: 27-Mar-1976, 42 y.o.   MRN: [id number]  Patient admitted after midnight.  Chart reviewed.  I have seen him numerous times in the past.  Case also discussed with critical care specialist.  Continue insulin drip until triglycerides less than 1000.  Follow-up labs daily  I will see the patient tomorrow  Dr. Loletha Grayer

## 2017-10-16 NOTE — ED Notes (Signed)
Patient's oxygen saturation decreased to 90% on RA. Patient place on 2L [place]. Patient's oxygen saturation increased to 95% on 2L [place].

## 2017-10-16 NOTE — ED Triage Notes (Addendum)
Patient with complaint of epigastric pain and vomiting that started yesterday. Patient states that he has a history of pancreatitis and this pain feels the same. Patient states that he took oxycodone for the pain this morning with some relief.

## 2017-10-16 NOTE — Consult Note (Signed)
Patient is well-known to our service with repeated admissions to SDU for severe hypertriglyceridemia and pancreatitis.  PCCM service is available for any issues while in SDU.  Merton Border, MD PCCM service Mobile [phone] Pager [phone] 10/16/2017 12:05 PM

## 2017-10-16 NOTE — ED Notes (Signed)
Dextrose administered at 150 mL/hour per MD Pyreddy

## 2017-10-16 NOTE — ED Notes (Signed)
Patient c/o of hot flash/discomfort. Patient diaphoretic. MD Rifenbark informed. MD to bedside. Patient's heart rate, oxygen saturation, cardiac rhythm, and respirations all within normal limits.

## 2017-10-16 NOTE — ED Provider Notes (Addendum)
[facility] Emergency Department Provider Note  ____________________________________________   First MD Initiated Contact with Patient 10/16/17 0406     (approximate)  I have reviewed the triage vital signs and the nursing notes.   HISTORY  Chief Complaint Abdominal Pain and Emesis   HPI Sergio [name] is a 42 y.o. male who comes to the emergency department with roughly 24 hours of insidious onset severe epigastric pain radiating straight to his back.  He has a complex past medical history including familial hypertriglyceridemia and has recurrent episodes of pancreatitis.  His last episode was roughly 5 months ago.  His pain is currently associated with nausea and vomiting.  Nothing seems to make his pain better but it is worsened by eating.  Past Medical History:  Diagnosis Date  . Anxiety   . Chicken pox   . Depression   . Diabetes mellitus    DM2  . Familial hypertriglyceridemia    severe  . Gastric ulcer 2009  . HTN (hypertension)   . Morbid obesity ([place])    s/p bariatric sleeve surgery 01/2015  . Recurrent acute pancreatitis    secondary to hypertriglyceridemia   . Sleep apnea 1999   uses CPAP    Patient Active Problem List   Diagnosis Date Noted  . Acute pancreatitis 05/13/2017  . Hospital discharge follow-up 08/10/2016  . Sixth nerve palsy of left eye   . Pancreatic insufficiency 07/24/2016  . Hyponatremia 07/24/2016  . Encounter for preventive health examination 05/01/2016  . Depression, major, recurrent, moderate ([place]) 03/14/2016  . S/P bariatric surgery 02/01/2015  . S/P laparoscopic cholecystectomy 02/01/2015  . Diarrhea 05/29/2012  . Recurrent pancreatitis ([place]) 05/29/2012  . Generalized anxiety disorder 04/29/2012  . Obesity (BMI 30-39.9) 04/29/2012  . DM (diabetes mellitus), type 2, uncontrolled w/neurologic complication ([place])   . Hypertriglyceridemia   . HTN (hypertension)   . Sleep apnea     Past Surgical History:    Procedure Laterality Date  . CHOLECYSTECTOMY    . LAPAROSCOPIC GASTRIC SLEEVE RESECTION    . [place] SURGERY  2008  . TONSILLECTOMY  2003  . VASECTOMY      Prior to Admission medications   Medication Sig Start Date End Date Taking? Authorizing Provider  ALPRAZolam Duanne Moron) 0.5 MG tablet Take 1 tablet (0.5 mg total) by mouth at bedtime as needed for sleep. 05/26/17   Crecencio Mc, MD  amoxicillin-clavulanate (AUGMENTIN) 875-125 MG tablet Take 1 tablet by mouth 2 (two) times daily. 09/08/17   Crecencio Mc, MD  finasteride (PROSCAR) 5 MG tablet Take 1 tablet (5 mg total) by mouth daily. Patient taking differently: Take 5 mg by mouth at bedtime.  01/27/16   Vaughan Basta, MD  gemfibrozil (LOPID) 600 MG tablet Take 1 tablet (600 mg total) by mouth 2 (two) times daily before a meal. 05/23/17   Gouru, Aruna, MD  insulin aspart (NOVOLOG) 100 UNIT/ML injection Inject 1 Dose into the skin See admin instructions. Use up to 120 units daily via insulin pump 09/11/15   [provider]  omega-3 acid ethyl esters (LOVAZA) 1 g capsule Take 2 capsules (2 g total) by mouth 2 (two) times daily. 05/23/17   [name], MD  Oxycodone HCl 10 MG TABS Take 1 tablet (10 mg total) by mouth every 6 (six) hours as needed. 05/23/17   Gouru, Illene Silver, MD  Pancrelipase, Lip-Prot-Amyl, (ZENPEP) 40000-126000 units CPEP Take 1 capsule by mouth 5 (five) times daily. Before meals and  snacks  06/02/17   Crecencio Mc, MD  predniSONE (DELTASONE) 10 MG tablet 6 tablets on Day 1 , then reduce by 1 tablet daily until gone 09/08/17   Crecencio Mc, MD  pregabalin (LYRICA) 300 MG capsule Take 300 mg by mouth at bedtime.     [provider]  rosuvastatin (CRESTOR) 10 MG tablet Take 1 tablet (10 mg total) by mouth daily at 6 PM. 05/23/17   Gouru, Aruna, MD  sertraline (ZOLOFT) 100 MG tablet Take 1 tablet (100 mg total) by mouth daily. 12/02/16   Crecencio Mc, MD  tamsulosin (FLOMAX) 0.4 MG CAPS capsule  Take 1 capsule (0.4 mg total) by mouth daily. Patient taking differently: Take 0.4 mg by mouth at bedtime.  01/27/16   Vaughan Basta, MD  traZODone (DESYREL) 100 MG tablet Take 2 tablets (200 mg total) by mouth daily as needed for sleep. 05/26/17   Crecencio Mc, MD    Allergies Codeine and Ivp dye [iodinated diagnostic agents]  Family History  Adopted: Yes  Problem Relation Age of Onset  . Hypertension Mother   . Hyperlipidemia Mother   . Hypertension Father   . Hyperlipidemia Father     Social History Social History   Tobacco Use  . Smoking status: Never Smoker  . Smokeless tobacco: Never Used  Substance Use Topics  . Alcohol use: No    Frequency: Never  . Drug use: No    Review of Systems Constitutional: No fever/chills Eyes: No visual changes. ENT: No sore throat. Cardiovascular: Denies chest pain. Respiratory: Denies shortness of breath. Gastrointestinal: Positive for abdominal pain.  Positive for nausea, positive for vomiting.  No diarrhea.  No constipation. Genitourinary: Negative for dysuria. Musculoskeletal: Negative for back pain. Skin: Negative for rash. Neurological: Negative for headaches, focal weakness or numbness.   ____________________________________________   PHYSICAL EXAM:  VITAL SIGNS: ED Triage Vitals  Enc Vitals Group     BP 10/16/17 0119 (!) 164/102     Pulse Rate 10/16/17 0119 89     Resp 10/16/17 0119 18     Temp 10/16/17 0119 98.8 F (37.1 C)     Temp Source 10/16/17 0119 Oral     SpO2 10/16/17 0119 96 %     Weight 10/16/17 0121 250 lb (113.4 kg)     Height 10/16/17 0121 5\' 10"  (1.778 m)     Head Circumference --      Peak Flow --      Pain Score 10/16/17 0121 9     Pain Loc --      Pain Edu? --      Excl. in [place]? --     Constitutional: Alert and oriented x4 appears uncomfortable nontoxic no diaphoresis speaks in full clear sentences Eyes: PERRL EOMI. Head: Atraumatic. Nose: No  congestion/rhinnorhea. Mouth/Throat: No trismus Neck: No stridor.   Cardiovascular: Normal rate, regular rhythm. Grossly normal heart sounds.  Good peripheral circulation. Respiratory: Normal respiratory effort.  No retractions. Lungs CTAB and moving good air Gastrointestinal: Soft abdomen diffuse upper tenderness with no rebound no guarding no peritonitis Musculoskeletal: No lower extremity edema   Neurologic:  Normal speech and language. No gross focal neurologic deficits are appreciated. Skin:  Skin is warm, dry and intact. No rash noted. Psychiatric: Mood and affect are normal. Speech and behavior are normal.    ____________________________________________   DIFFERENTIAL includes but not limited to  Pancreatitis, dehydration, metabolic derangement, appendicitis, diverticulitis, hypertriglycerides ____________________________________________   LABS (all labs ordered are listed, but  only abnormal results are displayed)  Labs Reviewed  CBC - Abnormal; Notable for the following components:      Result Value   RDW 15.5 (*)    All other components within normal limits  TRIGLYCERIDES - Abnormal; Notable for the following components:   Triglycerides 4,990 (*)    All other components within normal limits  URINALYSIS, COMPLETE (UACMP) WITH MICROSCOPIC  COMPREHENSIVE METABOLIC PANEL  LIPASE, BLOOD    Lab work reviewed by me shows extraordinarily elevated triglycerides Lipase is normal however this is consistent with acute on chronic pancreatitis.  Electrolytes largely within normal limits __________________________________________  EKG   ____________________________________________  RADIOLOGY   ____________________________________________   PROCEDURES  Procedure(s) performed: yes  Angiocath insertion Performed by: Darel Hong  Consent: Verbal consent obtained. Risks and benefits: risks, benefits and alternatives were discussed Time out: Immediately prior to  procedure a "time out" was called to verify the correct patient, procedure, equipment, support staff and site/side marked as required.  Preparation: Patient was prepped and draped in the usual sterile fashion.  Vein Location: Right antecubital fossa  Ultrasound Guided  Gauge: 20/22 twin cath  Normal blood return and flush without difficulty Patient tolerance: Patient tolerated the procedure well with no immediate complications.     .Critical Care Performed by: Darel Hong, MD Authorized by: Darel Hong, MD   Critical care provider statement:    Critical care time (minutes):  40   Critical care time was exclusive of:  Separately billable procedures and treating other patients   Critical care was necessary to treat or prevent imminent or life-threatening deterioration of the following conditions:  Dehydration and metabolic crisis   Critical care was time spent personally by me on the following activities:  Development of treatment plan with patient or surrogate, discussions with consultants, evaluation of patient's response to treatment, examination of patient, obtaining history from patient or surrogate, ordering and performing treatments and interventions, ordering and review of laboratory studies, ordering and review of radiographic studies, pulse oximetry, re-evaluation of patient's condition and review of old charts    Critical Care performed: no  Observation: no ____________________________________________   INITIAL IMPRESSION / ASSESSMENT AND PLAN / ED COURSE  Pertinent labs & imaging results that were available during my care of the patient were reviewed by me and considered in my medical decision making (see chart for details).  On arrival the patient is uncomfortable appearing with a history which is consistent with pancreatitis.  Triglycerides are nearly 5000 which given his clinical history is consistent with acute on chronic pancreatitis.  The patient has  received multiple doses of opioid pain medication and he requires inpatient admission for insulin drip, D5 half-normal saline, and aggressive antiemetics and opioid pain medication.  I discussed with the patient who verbalized understanding and agreement with the plan.  I then discussed with the hospitalist who is graciously agreed to admit the patient to his service.      ____________________________________________   FINAL CLINICAL IMPRESSION(S) / ED DIAGNOSES  Final diagnoses:  Acute pancreatitis, unspecified complication status, unspecified pancreatitis type  Hypertriglyceridemia      NEW MEDICATIONS STARTED DURING THIS VISIT:  This SmartLink is deprecated. Use AVSMEDLIST instead to display the medication list for a patient.   Note:  This document was prepared using Dragon voice recognition software and may include unintentional dictation errors.     Darel Hong, MD 10/16/17 7616    Darel Hong, MD 10/16/17 [phone]

## 2017-10-16 NOTE — ED Notes (Signed)
Attempted to call report to ICU primary RN. RN unavailable at this time. Was informed RN would return call.

## 2017-10-16 NOTE — ED Notes (Signed)
Ice pack applied to patient's posterior neck for comfort.

## 2017-10-16 NOTE — Progress Notes (Signed)
eLink Physician-Brief Progress Note Patient Name: Sergio [name] DOB: August 29, 1976 MRN: [id number]   Date of Service  10/16/2017  HPI/Events of Note  Request to restart home Lyrica and Trazodone.   eICU Interventions  Will restart home Lyrica and Trazodone.     Intervention Category Intermediate Interventions: Other:  [name] 10/16/2017, 11:12 PM

## 2017-10-16 NOTE — ED Notes (Signed)
Pt reports he is feeling hot, face flushed and diaphoretic, VS stable pt has a cold compress to help him.

## 2017-10-16 NOTE — ED Notes (Signed)
This RN confirmed insulin order with MD Rifenbark. MD ordered Insulin to be administered at 10 units/hour. Insulin administered. Dose verified by Ramond Dial

## 2017-10-16 NOTE — ED Notes (Signed)
IV attempted x 2 by this RN. 

## 2017-10-16 NOTE — Plan of Care (Signed)
  Elimination: Will not experience complications related to urinary retention 10/16/2017 1512 - Not Progressing by Farris Has D, RN Note Pt states he experiences urinary retention with pain medication. Patient has had difficulty voiding this shift.  Bladder scan shows 579ml.  Order received for foley catheter to relieve acute urinary retention.

## 2017-10-16 NOTE — Progress Notes (Signed)
Patient's home CPAP machine checked.  There were no frayed wires.  [facility] has been contacted to come check as well.

## 2017-10-16 NOTE — H&P (Addendum)
[facility] NAME: Sergio [name]    MR#:  [id number]  DATE OF BIRTH:  [birth year]  DATE OF ADMISSION:  10/16/2017  PRIMARY CARE PHYSICIAN: Crecencio Mc, MD   REQUESTING/REFERRING PHYSICIAN:   CHIEF COMPLAINT:   Chief Complaint  Patient presents with  . Abdominal Pain  . Emesis    HISTORY OF PRESENT ILLNESS: Sergio [name]  is a 42 y.o. male with a known history of hypertriglyceridemia, diabetes mellitus, pancreatitis, hypertension presented to the emergency room with abdominal pain, nausea and vomiting.  The abdominal pain has been severe since yesterday located in the epigastrium 8 out of 10 on a scale of 1-10.  Has nausea and vomiting.  Patient has history of hypertriglyceridemia and his triglyceride levels today were more than 4900.  He usually gets insulin drip to lower the triglyceride levels.  No complaints of any chest pain, shortness of breath.  No fever chills and cough.  PAST MEDICAL HISTORY:   Past Medical History:  Diagnosis Date  . Anxiety   . Chicken pox   . Depression   . Diabetes mellitus    DM2  . Familial hypertriglyceridemia    severe  . Gastric ulcer 2009  . HTN (hypertension)   . Morbid obesity ([place])    s/p bariatric sleeve surgery 01/2015  . Recurrent acute pancreatitis    secondary to hypertriglyceridemia   . Sleep apnea 1999   uses CPAP    PAST SURGICAL HISTORY:  Past Surgical History:  Procedure Laterality Date  . CHOLECYSTECTOMY    . LAPAROSCOPIC GASTRIC SLEEVE RESECTION    . [place] SURGERY  2008  . TONSILLECTOMY  2003  . VASECTOMY      SOCIAL HISTORY:  Social History   Tobacco Use  . Smoking status: Never Smoker  . Smokeless tobacco: Never Used  Substance Use Topics  . Alcohol use: No    Frequency: Never    FAMILY HISTORY:  Family History  Adopted: Yes  Problem Relation Age of Onset  . Hypertension Mother   . Hyperlipidemia Mother   . Hypertension Father   .  Hyperlipidemia Father     DRUG ALLERGIES:  Allergies  Allergen Reactions  . Codeine Anaphylaxis  . Ivp Dye [Iodinated Diagnostic Agents] Other (See Comments)    Kidneys stop working    REVIEW OF SYSTEMS:   CONSTITUTIONAL: No fever, fatigue or weakness.  EYES: No blurred or double vision.  EARS, NOSE, AND THROAT: No tinnitus or ear pain.  RESPIRATORY: No cough, shortness of breath, wheezing or hemoptysis.  CARDIOVASCULAR: No chest pain, orthopnea, edema.  GASTROINTESTINAL: Has nausea, vomiting,  Has abdominal pain.  GENITOURINARY: No dysuria, hematuria.  ENDOCRINE: No polyuria, nocturia,  HEMATOLOGY: No anemia, easy bruising or bleeding SKIN: No rash or lesion. MUSCULOSKELETAL: No joint pain or arthritis.   NEUROLOGIC: No tingling, numbness, weakness.  PSYCHIATRY: No anxiety or depression.   MEDICATIONS AT HOME:  Prior to Admission medications   Medication Sig Start Date End Date Taking? Authorizing Provider  ALPRAZolam Duanne Moron) 0.5 MG tablet Take 1 tablet (0.5 mg total) by mouth at bedtime as needed for sleep. 05/26/17  Yes Crecencio Mc, MD  finasteride (PROSCAR) 5 MG tablet Take 1 tablet (5 mg total) by mouth daily. Patient taking differently: Take 5 mg by mouth at bedtime.  01/27/16  Yes Vaughan Basta, MD  gemfibrozil (LOPID) 600 MG tablet Take 1 tablet (600 mg total) by mouth 2 (two) times  daily before a meal. 05/23/17  Yes Gouru, Aruna, MD  insulin aspart (NOVOLOG) 100 UNIT/ML injection Inject 1 Dose into the skin See admin instructions. Use up to 120 units daily via insulin pump 09/11/15  Yes [provider]  omega-3 acid ethyl esters (LOVAZA) 1 g capsule Take 2 capsules (2 g total) by mouth 2 (two) times daily. Patient taking differently: Take 4 g by mouth at bedtime.  05/23/17  Yes Gouru, Illene Silver, MD  Oxycodone HCl 10 MG TABS Take 1 tablet (10 mg total) by mouth every 6 (six) hours as needed. 05/23/17  Yes Gouru, Aruna, MD  Pancrelipase, Lip-Prot-Amyl,  (ZENPEP) 40000-126000 units CPEP Take 1 capsule by mouth 5 (five) times daily. Before meals and  snacks Patient taking differently: Take 1 capsule by mouth 5 (five) times daily. 5 Capsule Before meals and 2 capsules with snacks 06/02/17  Yes Crecencio Mc, MD  pregabalin (LYRICA) 300 MG capsule Take 300 mg by mouth at bedtime.    Yes [provider]  rosuvastatin (CRESTOR) 10 MG tablet Take 1 tablet (10 mg total) by mouth daily at 6 PM. Patient taking differently: Take 10 mg by mouth at bedtime.  05/23/17  Yes Gouru, Illene Silver, MD  sertraline (ZOLOFT) 100 MG tablet Take 1 tablet (100 mg total) by mouth daily. 12/02/16  Yes Crecencio Mc, MD  tamsulosin (FLOMAX) 0.4 MG CAPS capsule Take 1 capsule (0.4 mg total) by mouth daily. Patient taking differently: Take 0.4 mg by mouth at bedtime.  01/27/16  Yes Vaughan Basta, MD  traZODone (DESYREL) 100 MG tablet Take 2 tablets (200 mg total) by mouth daily as needed for sleep. Patient taking differently: Take 100-200 mg by mouth at bedtime as needed for sleep.  05/26/17  Yes Crecencio Mc, MD  amoxicillin-clavulanate (AUGMENTIN) 875-125 MG tablet Take 1 tablet by mouth 2 (two) times daily. Patient not taking: Reported on 10/16/2017 09/08/17   Crecencio Mc, MD  predniSONE (DELTASONE) 10 MG tablet 6 tablets on Day 1 , then reduce by 1 tablet daily until gone Patient not taking: Reported on 10/16/2017 09/08/17   Crecencio Mc, MD      PHYSICAL EXAMINATION:   VITAL SIGNS: Blood pressure (!) 136/94, pulse 87, temperature 98.3 F (36.8 C), temperature source Oral, resp. rate 17, height 5\' 10"  (1.778 m), weight 113.4 kg (250 lb), SpO2 97 %.  GENERAL:  42 y.o.-year-old patient lying in the bed with no acute distress.  EYES: Pupils equal, round, reactive to light and accommodation. No scleral icterus. Extraocular muscles intact.  HEENT: Head atraumatic, normocephalic. Oropharynx and nasopharynx clear.  NECK:  Supple, no jugular venous  distention. No thyroid enlargement, no tenderness.  LUNGS: Normal breath sounds bilaterally, no wheezing, rales,rhonchi or crepitation. No use of accessory muscles of respiration.  CARDIOVASCULAR: S1, S2 normal. No murmurs, rubs, or gallops.  ABDOMEN: Soft, tenderness in epigastrium, nondistended. Bowel sounds present. No organomegaly or mass.  EXTREMITIES: No pedal edema, cyanosis, or clubbing.  NEUROLOGIC: Cranial nerves II through XII are intact. Muscle strength 5/5 in all extremities. Sensation intact. Gait not checked.  PSYCHIATRIC: The patient is alert and oriented x 3.  SKIN: No obvious rash, lesion, or ulcer.   LABORATORY PANEL:   CBC Recent Labs  Lab 10/16/17 0125  WBC 5.6  HGB 14.5  HCT 42.3  PLT 218  MCV 80.8  MCH 27.7  MCHC 34.3  RDW 15.5*   ------------------------------------------------------------------------------------------------------------------  Chemistries  Recent Labs  Lab 10/16/17 0335  NA  138  K 5.0  CL 104  CO2 20*  GLUCOSE 182*  BUN 11  CREATININE 0.79  CALCIUM 9.2  AST 36  ALT 26  ALKPHOS 48  BILITOT 1.6*   ------------------------------------------------------------------------------------------------------------------ estimated creatinine clearance is 156.4 mL/min (by C-G formula based on SCr of 0.79 mg/dL). ------------------------------------------------------------------------------------------------------------------ No results for input(s): TSH, T4TOTAL, T3FREE, THYROIDAB in the last 72 hours.  Invalid input(s): FREET3   Coagulation profile No results for input(s): INR, PROTIME in the last 168 hours. ------------------------------------------------------------------------------------------------------------------- No results for input(s): DDIMER in the last 72 hours. -------------------------------------------------------------------------------------------------------------------  Cardiac Enzymes No results for input(s):  CKMB, TROPONINI, MYOGLOBIN in the last 168 hours.  Invalid input(s): CK ------------------------------------------------------------------------------------------------------------------ Invalid input(s): POCBNP  ---------------------------------------------------------------------------------------------------------------  Urinalysis    Component Value Date/Time   COLORURINE YELLOW (A) 01/28/2017 1520   APPEARANCEUR CLEAR (A) 01/28/2017 1520   APPEARANCEUR Clear 10/28/2014 1723   LABSPEC 1.019 01/28/2017 1520   LABSPEC 1.023 10/28/2014 1723   PHURINE 5.0 01/28/2017 1520   GLUCOSEU NEGATIVE 01/28/2017 1520   GLUCOSEU >=500 10/28/2014 1723   HGBUR NEGATIVE 01/28/2017 1520   BILIRUBINUR NEGATIVE 01/28/2017 1520   BILIRUBINUR Negative 10/28/2014 1723   KETONESUR 5 (A) 01/28/2017 1520   PROTEINUR NEGATIVE 01/28/2017 1520   NITRITE NEGATIVE 01/28/2017 1520   LEUKOCYTESUR NEGATIVE 01/28/2017 1520   LEUKOCYTESUR Negative 10/28/2014 1723     RADIOLOGY: No results found.  EKG: Orders placed or performed during the hospital encounter of 10/16/17  . ED EKG  . ED EKG    IMPRESSION AND PLAN: 42 year old male patient with history of hypertriglyceridemia, diabetes mellitus type 2, sleep apnea, pancreatitis in the past presented to the emergency room with abdominal pain and elevated triglycerides.  Admitting diagnosis 1.  Acute pancreatitis secondary to hypertriglyceridemia 2  Hypertriglyceridemia. 3.  Abdominal pain 4.  Diabetes mellitus Treatment plan Admit patient to stepdown unit IV fluids IV insulin drip to reduce the triglycerides Pain management with IV Dilaudid Follow-up triglyceride levels Intensivist on call notified  All the records are reviewed and case discussed with ED provider. Management plans discussed with the patient, family and they are in agreement.  CODE STATUS:FULL CODE Code Status History    Date Active Date Inactive Code Status Order ID Comments  User Context   05/13/2017 15:24 05/23/2017 20:06 Full Code [id number]  [name], MD Inpatient   01/28/2017 08:40 02/03/2017 15:43 Full Code [id number]  Harrie Foreman, MD Inpatient   07/24/2016 22:51 08/04/2016 18:37 Full Code [id number]  Theodoro Grist, MD Inpatient   03/11/2016 04:58 03/19/2016 16:44 Full Code [id number]  Harrie Foreman, MD Inpatient   01/17/2016 02:50 01/27/2016 17:47 Full Code [id number]  Harrie Foreman, MD Inpatient   08/01/2015 23:35 08/15/2015 16:43 Full Code [id number]  Lytle Butte, MD ED   07/21/2014 11:46 07/27/2014 18:16 Full Code [id number]  Oswald Hillock, MD Inpatient       TOTAL TIME TAKING CARE OF THIS PATIENT: 50 minutes.    Saundra Shelling M.D on 10/16/2017 at 6:50 AM  Between 7am to 6pm - Pager - [phone]  After 6pm go to [email or web address] - password [email or web address]  Office  [phone]  CC: Primary care physician; Crecencio Mc, MD

## 2017-10-16 NOTE — ED Notes (Signed)
Patient c/o nausea of warm temperature in room. Room temperature decreased. MD Rifenbark informed. MD ordered Haldol for nausea.

## 2017-10-16 NOTE — ED Notes (Signed)
Report received from [facility], RN Pt is calm in room, with 2L/[place] IV infusing.

## 2017-10-16 NOTE — ED Notes (Signed)
Given report to floor nurse

## 2017-10-17 DIAGNOSIS — K8500 Idiopathic acute pancreatitis without necrosis or infection: Secondary | ICD-10-CM

## 2017-10-17 DIAGNOSIS — E781 Pure hyperglyceridemia: Secondary | ICD-10-CM

## 2017-10-17 LAB — COMPREHENSIVE METABOLIC PANEL
ALBUMIN: 3.9 g/dL (ref 3.5–5.0)
ALT: 19 U/L (ref 17–63)
ANION GAP: 11 (ref 5–15)
AST: 24 U/L (ref 15–41)
Alkaline Phosphatase: 44 U/L (ref 38–126)
BILIRUBIN TOTAL: 0.9 mg/dL (ref 0.3–1.2)
BUN: 9 mg/dL (ref 6–20)
CHLORIDE: 100 mmol/L — AB (ref 101–111)
CO2: 27 mmol/L (ref 22–32)
Calcium: 8.4 mg/dL — ABNORMAL LOW (ref 8.9–10.3)
Creatinine, Ser: 0.65 mg/dL (ref 0.61–1.24)
GFR calc Af Amer: >60 mL/min (ref >60)
GFR calc non Af Amer: >60 mL/min (ref >60)
GLUCOSE: 68 mg/dL (ref 65–99)
POTASSIUM: 3.2 mmol/L — AB (ref 3.5–5.1)
Sodium: 138 mmol/L (ref 135–145)
TOTAL PROTEIN: 7.1 g/dL (ref 6.5–8.1)

## 2017-10-17 LAB — GLUCOSE, CAPILLARY
GLUCOSE-CAPILLARY: 108 mg/dL — AB (ref 65–99)
GLUCOSE-CAPILLARY: 66 mg/dL (ref 65–99)
GLUCOSE-CAPILLARY: 69 mg/dL (ref 65–99)
GLUCOSE-CAPILLARY: 70 mg/dL (ref 65–99)
GLUCOSE-CAPILLARY: 73 mg/dL (ref 65–99)
GLUCOSE-CAPILLARY: 73 mg/dL (ref 65–99)
GLUCOSE-CAPILLARY: 75 mg/dL (ref 65–99)
GLUCOSE-CAPILLARY: 75 mg/dL (ref 65–99)
GLUCOSE-CAPILLARY: 86 mg/dL (ref 65–99)
GLUCOSE-CAPILLARY: 87 mg/dL (ref 65–99)
Glucose-Capillary: 77 mg/dL (ref 65–99)
Glucose-Capillary: 66 mg/dL (ref 65–99)
Glucose-Capillary: 92 mg/dL (ref 65–99)
Glucose-Capillary: 87 mg/dL (ref 65–99)
Glucose-Capillary: 82 mg/dL (ref 65–99)
Glucose-Capillary: 85 mg/dL (ref 65–99)
Glucose-Capillary: 69 mg/dL (ref 65–99)
Glucose-Capillary: 79 mg/dL (ref 65–99)
Glucose-Capillary: 81 mg/dL (ref 65–99)
Glucose-Capillary: 94 mg/dL (ref 65–99)
Glucose-Capillary: 89 mg/dL (ref 65–99)

## 2017-10-17 LAB — CBC
HEMATOCRIT: 40.1 % (ref 40.0–52.0)
HEMOGLOBIN: 14 g/dL (ref 13.0–18.0)
MCH: 28.6 pg (ref 26.0–34.0)
MCHC: 34.9 g/dL (ref 32.0–36.0)
MCV: 82 fL (ref 80.0–100.0)
Platelets: 147 10*3/uL — ABNORMAL LOW (ref 150–440)
RBC: 4.89 MIL/uL (ref 4.40–5.90)
RDW: 15.4 % — ABNORMAL HIGH (ref 11.5–14.5)
WBC: 5.9 10*3/uL (ref 3.8–10.6)

## 2017-10-17 LAB — POTASSIUM: Potassium: 3.8 mmol/L (ref 3.5–5.1)

## 2017-10-17 LAB — LIPID PANEL
Cholesterol: 455 mg/dL — ABNORMAL HIGH (ref 0–200)
HDL: 18 mg/dL — ABNORMAL LOW (ref >40)
LDL CALC: UNDETERMINED mg/dL (ref 0–99)
TRIGLYCERIDES: 2590 mg/dL — AB (ref <150)
Total CHOL/HDL Ratio: 25.3 RATIO
VLDL: UNDETERMINED mg/dL (ref 0–40)

## 2017-10-17 LAB — MAGNESIUM: MAGNESIUM: 1.8 mg/dL (ref 1.7–2.4)

## 2017-10-17 MED ORDER — POTASSIUM CHLORIDE CRYS ER 20 MEQ PO TBCR
40.0000 meq | EXTENDED_RELEASE_TABLET | ORAL | Status: AC
  Administered 2017-10-17 (×2): 40 meq via ORAL
  Filled 2017-10-17 (×2): qty 2

## 2017-10-17 MED ORDER — SERTRALINE HCL 50 MG PO TABS
100.0000 mg | ORAL_TABLET | Freq: Every day | ORAL | Status: DC
  Administered 2017-10-17 – 2017-10-23 (×7): 100 mg via ORAL
  Filled 2017-10-17 (×8): qty 2

## 2017-10-17 MED ORDER — TAMSULOSIN HCL 0.4 MG PO CAPS: 0.4000 mg | ORAL_CAPSULE | Freq: Every day | ORAL | Status: DC

## 2017-10-17 MED ORDER — OMEGA-3-ACID ETHYL ESTERS 1 G PO CAPS
4.0000 g | ORAL_CAPSULE | Freq: Every day | ORAL | Status: DC
  Administered 2017-10-17 – 2017-10-22 (×6): 4 g via ORAL
  Filled 2017-10-17 (×6): qty 4

## 2017-10-17 MED ORDER — FINASTERIDE 5 MG PO TABS
5.0000 mg | ORAL_TABLET | Freq: Every day | ORAL | Status: DC
  Administered 2017-10-17 – 2017-10-22 (×6): 5 mg via ORAL
  Filled 2017-10-17 (×6): qty 1

## 2017-10-17 MED ORDER — DEXTROSE 50 % IV SOLN
25.0000 mL | Freq: Once | INTRAVENOUS | Status: AC
  Administered 2017-10-17: 25 mL via INTRAVENOUS

## 2017-10-17 MED ORDER — TAMSULOSIN HCL 0.4 MG PO CAPS
0.4000 mg | ORAL_CAPSULE | Freq: Every day | ORAL | Status: DC
  Administered 2017-10-17 – 2017-10-22 (×6): 0.4 mg via ORAL
  Filled 2017-10-17 (×6): qty 1

## 2017-10-17 MED ORDER — DEXTROSE 10 % IV SOLN
INTRAVENOUS | Status: DC
  Administered 2017-10-17 – 2017-10-20 (×9): via INTRAVENOUS
  Administered 2017-10-20: 150 mL/h via INTRAVENOUS
  Administered 2017-10-20: 22:00:00 via INTRAVENOUS
  Administered 2017-10-20: 150 mL/h via INTRAVENOUS
  Administered 2017-10-21 (×3): via INTRAVENOUS
  Administered 2017-10-22: 150 mL/h via INTRAVENOUS

## 2017-10-17 MED ORDER — ALPRAZOLAM 0.5 MG PO TABS: 0.5000 mg | ORAL_TABLET | Freq: Every evening | ORAL | Status: DC | PRN

## 2017-10-17 MED ORDER — FINASTERIDE 5 MG PO TABS: 5.0000 mg | ORAL_TABLET | Freq: Every day | ORAL | Status: DC

## 2017-10-17 MED ORDER — HYDROMORPHONE HCL 1 MG/ML IJ SOLN
1.0000 mg | INTRAMUSCULAR | Status: DC | PRN
  Administered 2017-10-17 – 2017-10-23 (×63): 1 mg via INTRAVENOUS
  Filled 2017-10-17 (×63): qty 1

## 2017-10-17 MED ORDER — DEXTROSE 50 % IV SOLN
INTRAVENOUS | Status: AC
  Administered 2017-10-17: 25 mL via INTRAVENOUS
  Filled 2017-10-17: qty 50

## 2017-10-17 NOTE — Progress Notes (Signed)
Patient ID: Sergio [name], male   DOB: [birth year], 42 y.o.   MRN: [id number]  [facility] PROGRESS NOTE  Sergio [name] EGB:[id number] DOB: [birth year] DOA: 10/16/2017 PCP: Crecencio Mc, MD  HPI/Subjective: Patient still having abdominal pain.  He also had urinary retention and needed a Foley catheter be placed.  He states this happens when he is on pain medications.  Some nausea.  Objective: Vitals:   10/17/17 1100 10/17/17 1200  BP: 106/77 105/77  Pulse: 64 65  Resp: (!) 9 17  Temp:  98 F (36.7 C)  SpO2: 93% 93%    Filed Weights   10/16/17 0121 10/16/17 0753  Weight: 113.4 kg (250 lb) 111.9 kg (246 lb 11.1 oz)    ROS: Review of Systems  Constitutional: Negative for chills and fever.  Eyes: Negative for blurred vision.  Respiratory: Negative for cough and shortness of breath.   Cardiovascular: Negative for chest pain.  Gastrointestinal: Positive for abdominal pain, constipation and nausea. Negative for diarrhea and vomiting.  Genitourinary: Negative for dysuria.  Musculoskeletal: Negative for joint pain.  Neurological: Negative for dizziness and headaches.   Exam: Physical Exam  Constitutional: He is oriented to person, place, and time.  HENT:  Nose: No mucosal edema.  Mouth/Throat: No oropharyngeal exudate or posterior oropharyngeal edema.  Eyes: Conjunctivae, EOM and lids are normal. Pupils are equal, round, and reactive to light.  Neck: No JVD present. Carotid bruit is not present. No edema present. No thyroid mass and no thyromegaly present.  Cardiovascular: S1 normal and S2 normal. Exam reveals no gallop.  No murmur heard. Pulses:      Dorsalis pedis pulses are 2+ on the right side, and 2+ on the left side.  Respiratory: No respiratory distress. He has decreased breath sounds in the right lower field and the left lower field. He has no wheezes. He has no rhonchi. He has no rales.  GI: Soft. Bowel sounds are normal. There is tenderness in the epigastric  area.  Musculoskeletal:       Right ankle: He exhibits no swelling.       Left ankle: He exhibits no swelling.  Lymphadenopathy:    He has no cervical adenopathy.  Neurological: He is alert and oriented to person, place, and time. No cranial nerve deficit.  Skin: Skin is warm. No rash noted. Nails show no clubbing.  Psychiatric: He has a normal mood and affect.      Data Reviewed: Basic Metabolic Panel: Recent Labs  Lab 10/16/17 0335 10/17/17 0404  NA 138 138  K 5.0 3.2*  CL 104 100*  CO2 20* 27  GLUCOSE 182* 68  BUN 11 9  CREATININE 0.79 0.65  CALCIUM 9.2 8.4*  MG  --  1.8   Liver Function Tests: Recent Labs  Lab 10/16/17 0335 10/17/17 0404  AST 36 24  ALT 26 19  ALKPHOS 48 44  BILITOT 1.6* 0.9  PROT 7.5 7.1  ALBUMIN 4.6 3.9   Recent Labs  Lab 10/16/17 0335  LIPASE 22   CBC: Recent Labs  Lab 10/16/17 0125 10/17/17 0404  WBC 5.6 5.9  HGB 14.5 14.0  HCT 42.3 40.1  MCV 80.8 82.0  PLT 218 147*    CBG: Recent Labs  Lab 10/17/17 0858 10/17/17 1006 10/17/17 1057 10/17/17 1200 10/17/17 1301  GLUCAP 87 85 73 66 69    Recent Results (from the past 240 hour(s))  MRSA PCR Screening     Status: None   Collection Time:  10/16/17  7:51 AM  Result Value Ref Range Status   MRSA by PCR NEGATIVE NEGATIVE Final    Comment:        The GeneXpert MRSA Assay (FDA approved for NASAL specimens only), is one component of a comprehensive MRSA colonization surveillance program. It is not intended to diagnose MRSA infection nor to guide or monitor treatment for MRSA infections. Performed at [facility], [address] [place], [place] [address]       Scheduled Meds: . finasteride  5 mg Oral QHS  . gemfibrozil  600 mg Oral BID AC  . mouth rinse  15 mL Mouth Rinse BID  . omega-3 acid ethyl esters  4 g Oral QHS  . potassium chloride  40 mEq Oral Q2H  . pregabalin  300 mg Oral QHS  . sertraline  100 mg Oral Daily  . tamsulosin  0.4 mg Oral  QHS  . traZODone  200 mg Oral QHS   Continuous Infusions: . dextrose 125 mL/hr at 10/17/17 1205  . insulin (NOVOLIN-R) infusion 10 mL/hr at 10/17/17 1200    Assessment/Plan:  1. Severe hypertriglyceridemia with acute on chronic pancreatitis.  Continue insulin drip until triglycerides less than 1000.  As needed pain medications.  Continue gemfibrozil.  N.p.o. except for meds. 2. Hypokalemia.  Patient is on potassium orally. 3. Urinary retention with Foley catheter.  On finasteride and Flomax. 4. Diabetes mellitus.  On D10 drip secondary to insulin drip. 5. Depression on Zoloft  Code Status:     Code Status Orders  (From admission, onward)        Start     Ordered   10/16/17 0754  Full code  Continuous     10/16/17 0753    Code Status History    Date Active Date Inactive Code Status Order ID Comments User Context   05/13/2017 15:24 05/23/2017 20:06 Full Code [id number]  [name], MD Inpatient   01/28/2017 08:40 02/03/2017 15:43 Full Code [id number]  Harrie Foreman, MD Inpatient   07/24/2016 22:51 08/04/2016 18:37 Full Code [id number]  Theodoro Grist, MD Inpatient   03/11/2016 04:58 03/19/2016 16:44 Full Code [id number]  Harrie Foreman, MD Inpatient   01/17/2016 02:50 01/27/2016 17:47 Full Code [id number]  Harrie Foreman, MD Inpatient   08/01/2015 23:35 08/15/2015 16:43 Full Code [id number]  Lytle Butte, MD ED   07/21/2014 11:46 07/27/2014 18:16 Full Code [id number]  Oswald Hillock, MD Inpatient     Family Communication: As per critical care specialist Disposition Plan: To be determined  Consultants:  Critical care specialist   Time spent: 25 minutes  [place]

## 2017-10-17 NOTE — Progress Notes (Signed)
Pharmacy Electrolyte Monitoring Consult:  Pharmacy consulted to assist in monitoring and replacing electrolytes in this 42 y.o. male admitted on 10/16/2017 with Abdominal Pain and Emesis Patient is on insulin drip for hypertriglyceridemia.   Labs:  Sodium (mmol/L)  Date Value  10/17/2017 138  11/04/2014 137   Potassium (mmol/L)  Date Value  10/17/2017 3.8  11/04/2014 3.6   Magnesium (mg/dL)  Date Value  10/17/2017 1.8  03/18/2014 1.9   Phosphorus (mg/dL)  Date Value  05/21/2017 4.5   Calcium (mg/dL)  Date Value  10/17/2017 8.4 (L)   Calcium, Total (mg/dL)  Date Value  11/04/2014 8.5   Albumin (g/dL)  Date Value  10/17/2017 3.9  11/01/2014 3.0 (L)    Plan: Will replace K with KCl 40 meq q 2 hours x 2 and recheck K at 1800 while patient is at risk for hypokalemia due to insulin.    1/7 1757 K 3.8 - WNL Recheck labs in AM  Rocky Morel 10/17/2017 8:33 PM

## 2017-10-17 NOTE — Progress Notes (Signed)
eLink Physician-Brief Progress Note Patient Name: Sergio [name] DOB: 1976-05-19 MRN: [id number]   Date of Service  10/17/2017  HPI/Events of Note  Hypoglycemia - Blood glucose = 75 --> 73 --> 77 --> 66.   eICU Interventions  Will order: 1. Routine Hypoglycemia orders.  2. Decrease Novolog insulin IV infusion to 8 units/hour.  3. Increase D5 0.9 NaCl to run IV at 15o mL/hour.      Intervention Category Major Interventions: Other:  Sergio [name],Sergio [name] 10/17/2017, 4:20 AM

## 2017-10-17 NOTE — Progress Notes (Signed)
   CHIEF COMPLAINT:   Chief Complaint  Patient presents with  . Abdominal Pain  . Emesis    Subjective  Admitted for severe hypertryglyceridemia- On insulin infusion Has abd pain Getting pain meds No chest pain and SOB      Review of Systems:  Gen:  Denies  fever, sweats, chills weigh loss   HEENT: Denies blurred vision, double vision, ear pain, eye pain, hearing loss, nose bleeds, sore throat  Cardiac:  No dizziness, chest pain or heaviness, chest tightness,edema  Resp:   Denies cough or sputum porduction, shortness of breath,wheezing, hemoptysis,   Gi: + stomach pain,+ nausea   Other:  All other systems negative  Objective   Examination:  General exam: Appears calm and comfortable, nasal biPAP Respiratory system: Clear to auscultation. Respiratory effort normal. HEENT: [place]/AT, PERRLA, no thrush, no stridor. Cardiovascular system: S1 & S2 heard, RRR. No JVD, murmurs, rubs, gallops or clicks. No pedal edema. Gastrointestinal system: Abdomen is distended, soft and+tender. No organomegaly or masses felt. Normal bowel sounds heard. Central nervous system: Alert and oriented. No focal neurological deficits. Extremities: Symmetric 5 x 5 power. Skin: No rashes, lesions or ulcers Psychiatry: Judgement and insight appear normal. Mood & affect appropriate.   VITALS:  height is 5\' 10"  (1.778 m) and weight is 246 lb 11.1 oz (111.9 kg). His oral temperature is 98 F (36.7 C). His blood pressure is 107/71 and his pulse is 62. His respiration is 5 (abnormal) and oxygen saturation is 90%.   I personally reviewed Labs under Results section. TG levels 4990-->2590     Assessment/Plan:  Severe hypertriglyceridemia with pancreatitis/abd pain 1.continue insulin drip 2.pain meds 3.nasal cpap from home  [place], M.D.  [facility]  Medical Director [place] Director [place] Department

## 2017-10-17 NOTE — Plan of Care (Signed)
Patient continues on insulin drip at this time.  CBGs in 83s.  Hypoglycemic protocol for CBG of 60.  CBG increased to 108.  No signs and symptoms of hypoglycemia noted or reported.  Patient given Dilaudid for pain x 3.  Patient asleep most of shift.  Will continue to monitor.

## 2017-10-17 NOTE — Progress Notes (Signed)
Pharmacy Electrolyte Monitoring Consult:  Pharmacy consulted to assist in monitoring and replacing electrolytes in this 42 y.o. male admitted on 10/16/2017 with Abdominal Pain and Emesis Patient is on insulin drip for hypertriglyceridemia.   Labs:  Sodium (mmol/L)  Date Value  10/17/2017 138  11/04/2014 137   Potassium (mmol/L)  Date Value  10/17/2017 3.2 (L)  11/04/2014 3.6   Magnesium (mg/dL)  Date Value  10/17/2017 1.8  03/18/2014 1.9   Phosphorus (mg/dL)  Date Value  05/21/2017 4.5   Calcium (mg/dL)  Date Value  10/17/2017 8.4 (L)   Calcium, Total (mg/dL)  Date Value  11/04/2014 8.5   Albumin (g/dL)  Date Value  10/17/2017 3.9  11/01/2014 3.0 (L)    Plan: Will replace K with KCl 40 meq q 2 hours x 2 and recheck K at 1800 while patient is at risk for hypokalemia due to insulin.   Gavyn, Ybarra D 10/17/2017 4:05 PM

## 2017-10-18 LAB — BASIC METABOLIC PANEL
ANION GAP: 6 (ref 5–15)
BUN: 6 mg/dL (ref 6–20)
CHLORIDE: 98 mmol/L — AB (ref 101–111)
CO2: 30 mmol/L (ref 22–32)
CREATININE: 0.8 mg/dL (ref 0.61–1.24)
Calcium: 8.5 mg/dL — ABNORMAL LOW (ref 8.9–10.3)
GFR calc Af Amer: >60 mL/min (ref >60)
GFR calc non Af Amer: >60 mL/min (ref >60)
Glucose, Bld: 99 mg/dL (ref 65–99)
POTASSIUM: 3.8 mmol/L (ref 3.5–5.1)
SODIUM: 134 mmol/L — AB (ref 135–145)

## 2017-10-18 LAB — GLUCOSE, CAPILLARY
GLUCOSE-CAPILLARY: 101 mg/dL — AB (ref 65–99)
GLUCOSE-CAPILLARY: 105 mg/dL — AB (ref 65–99)
GLUCOSE-CAPILLARY: 81 mg/dL (ref 65–99)
GLUCOSE-CAPILLARY: 91 mg/dL (ref 65–99)
GLUCOSE-CAPILLARY: 92 mg/dL (ref 65–99)
GLUCOSE-CAPILLARY: 93 mg/dL (ref 65–99)
GLUCOSE-CAPILLARY: 93 mg/dL (ref 65–99)
GLUCOSE-CAPILLARY: 99 mg/dL (ref 65–99)
GLUCOSE-CAPILLARY: 99 mg/dL (ref 65–99)
Glucose-Capillary: 102 mg/dL — ABNORMAL HIGH (ref 65–99)
Glucose-Capillary: 104 mg/dL — ABNORMAL HIGH (ref 65–99)
Glucose-Capillary: 104 mg/dL — ABNORMAL HIGH (ref 65–99)
Glucose-Capillary: 113 mg/dL — ABNORMAL HIGH (ref 65–99)
Glucose-Capillary: 85 mg/dL (ref 65–99)
Glucose-Capillary: 86 mg/dL (ref 65–99)
Glucose-Capillary: 87 mg/dL (ref 65–99)
Glucose-Capillary: 92 mg/dL (ref 65–99)
Glucose-Capillary: 95 mg/dL (ref 65–99)
Glucose-Capillary: 97 mg/dL (ref 65–99)
Glucose-Capillary: 99 mg/dL (ref 65–99)

## 2017-10-18 LAB — CBC
HCT: 41 % (ref 40.0–52.0)
HEMOGLOBIN: 13.9 g/dL (ref 13.0–18.0)
MCH: 28.1 pg (ref 26.0–34.0)
MCHC: 34 g/dL (ref 32.0–36.0)
MCV: 82.6 fL (ref 80.0–100.0)
PLATELETS: 147 10*3/uL — AB (ref 150–440)
RBC: 4.96 MIL/uL (ref 4.40–5.90)
RDW: 15.2 % — ABNORMAL HIGH (ref 11.5–14.5)
WBC: 4.7 10*3/uL (ref 3.8–10.6)

## 2017-10-18 LAB — LIPASE, BLOOD: Lipase: 16 U/L (ref 11–51)

## 2017-10-18 LAB — MAGNESIUM: MAGNESIUM: 1.8 mg/dL (ref 1.7–2.4)

## 2017-10-18 LAB — TRIGLYCERIDES: Triglycerides: 1807 mg/dL — ABNORMAL HIGH (ref <150)

## 2017-10-18 MED ORDER — SODIUM CHLORIDE 0.9 % IV BOLUS (SEPSIS): 500.0000 mL | Freq: Once | INTRAVENOUS | Status: DC

## 2017-10-18 MED ORDER — OXYCODONE-ACETAMINOPHEN 5-325 MG PO TABS
1.0000 | ORAL_TABLET | Freq: Four times a day (QID) | ORAL | Status: DC | PRN
  Administered 2017-10-22 – 2017-10-23 (×3): 2 via ORAL
  Filled 2017-10-18 (×3): qty 2

## 2017-10-18 MED ORDER — ROSUVASTATIN CALCIUM 20 MG PO TABS
20.0000 mg | ORAL_TABLET | Freq: Every day | ORAL | Status: DC
  Administered 2017-10-18 – 2017-10-22 (×5): 20 mg via ORAL
  Filled 2017-10-18 (×5): qty 2

## 2017-10-18 MED ORDER — ROSUVASTATIN CALCIUM 10 MG PO TABS: 10.0000 mg | ORAL_TABLET | Freq: Every day | ORAL | Status: DC

## 2017-10-18 NOTE — Progress Notes (Signed)
Patient ID: Sergio [name], male   DOB: [birth year], 42 y.o.   MRN: [id number]   [facility] PROGRESS NOTE  Sergio [name] PNT:[id number] DOB: 16-Jan-1976 DOA: 10/16/2017 PCP: Crecencio Mc, MD  HPI/Subjective: Patient still not feeling well.  Having a lot of epigastric pain and nausea.  No vomiting.  Still with constipation has not had a bowel movement yet.  Objective: Vitals:   10/18/17 1215 10/18/17 1300  BP:    Pulse: 80 73  Resp: 10 10  Temp:    SpO2: 92% 94%    Filed Weights   10/16/17 0121 10/16/17 0753  Weight: 113.4 kg (250 lb) 111.9 kg (246 lb 11.1 oz)    ROS: Review of Systems  Constitutional: Negative for chills and fever.  Eyes: Negative for blurred vision.  Respiratory: Negative for cough and shortness of breath.   Cardiovascular: Negative for chest pain.  Gastrointestinal: Positive for abdominal pain, constipation and nausea. Negative for diarrhea and vomiting.  Genitourinary: Negative for dysuria.  Musculoskeletal: Negative for joint pain.  Neurological: Negative for dizziness and headaches.   Exam: Physical Exam  Constitutional: He is oriented to person, place, and time.  HENT:  Nose: No mucosal edema.  Mouth/Throat: No oropharyngeal exudate or posterior oropharyngeal edema.  Eyes: Conjunctivae, EOM and lids are normal. Pupils are equal, round, and reactive to light.  Neck: No JVD present. Carotid bruit is not present. No edema present. No thyroid mass and no thyromegaly present.  Cardiovascular: S1 normal and S2 normal. Exam reveals no gallop.  No murmur heard. Pulses:      Dorsalis pedis pulses are 2+ on the right side, and 2+ on the left side.  Respiratory: No respiratory distress. He has decreased breath sounds in the right lower field and the left lower field. He has no wheezes. He has no rhonchi. He has no rales.  GI: Soft. Bowel sounds are normal. There is tenderness in the epigastric area.  Musculoskeletal:       Right ankle: He exhibits no  swelling.       Left ankle: He exhibits no swelling.  Lymphadenopathy:    He has no cervical adenopathy.  Neurological: He is alert and oriented to person, place, and time. No cranial nerve deficit.  Skin: Skin is warm. No rash noted. Nails show no clubbing.  Psychiatric: He has a normal mood and affect.      Data Reviewed: Basic Metabolic Panel: Recent Labs  Lab 10/16/17 0335 10/17/17 0404 10/17/17 1757 10/18/17 0528  NA 138 138  --  134*  K 5.0 3.2* 3.8 3.8  CL 104 100*  --  98*  CO2 20* 27  --  30  GLUCOSE 182* 68  --  99  BUN 11 9  --  6  CREATININE 0.79 0.65  --  0.80  CALCIUM 9.2 8.4*  --  8.5*  MG  --  1.8  --  1.8   Liver Function Tests: Recent Labs  Lab 10/16/17 0335 10/17/17 0404  AST 36 24  ALT 26 19  ALKPHOS 48 44  BILITOT 1.6* 0.9  PROT 7.5 7.1  ALBUMIN 4.6 3.9   Recent Labs  Lab 10/16/17 0335 10/18/17 0528  LIPASE 22 16   CBC: Recent Labs  Lab 10/16/17 0125 10/17/17 0404 10/18/17 0528  WBC 5.6 5.9 4.7  HGB 14.5 14.0 13.9  HCT 42.3 40.1 41.0  MCV 80.8 82.0 82.6  PLT 218 147* 147*    CBG: Recent Labs  Lab  10/18/17 0729 10/18/17 0857 10/18/17 0941 10/18/17 1054 10/18/17 1222  GLUCAP 105* 101* 93 95 91    Recent Results (from the past 240 hour(s))  MRSA PCR Screening     Status: None   Collection Time: 10/16/17  7:51 AM  Result Value Ref Range Status   MRSA by PCR NEGATIVE NEGATIVE Final    Comment:        The GeneXpert MRSA Assay (FDA approved for NASAL specimens only), is one component of a comprehensive MRSA colonization surveillance program. It is not intended to diagnose MRSA infection nor to guide or monitor treatment for MRSA infections. Performed at [facility], [address] [place], [place] [address]       Scheduled Meds: . finasteride  5 mg Oral QHS  . gemfibrozil  600 mg Oral BID AC  . mouth rinse  15 mL Mouth Rinse BID  . omega-3 acid ethyl esters  4 g Oral QHS  . pregabalin  300 mg  Oral QHS  . rosuvastatin  20 mg Oral q1800  . sertraline  100 mg Oral Daily  . tamsulosin  0.4 mg Oral QHS  . traZODone  200 mg Oral QHS   Continuous Infusions: . dextrose 125 mL/hr at 10/18/17 0700  . insulin (NOVOLIN-R) infusion 8 mL/hr at 10/18/17 0700    Assessment/Plan:  1. Severe hypertriglyceridemia with acute on chronic pancreatitis.  Continue insulin drip until triglycerides less than 1000.  As needed pain medications.  Continue gemfibrozil.  N.p.o. except for meds. 2. Hypokalemia.  Replaced. 3. Urinary retention with Foley catheter.  On finasteride and Flomax. 4. Diabetes mellitus.  On D10 drip secondary to insulin drip. 5. Depression on Zoloft 6. Sleep apnea wears CPAP in bed 7. Morbid obesity- weight loss needed  Code Status:     Code Status Orders  (From admission, onward)        Start     Ordered   10/16/17 0754  Full code  Continuous     10/16/17 0753    Code Status History    Date Active Date Inactive Code Status Order ID Comments User Context   05/13/2017 15:24 05/23/2017 20:06 Full Code [id number]  [name], MD Inpatient   01/28/2017 08:40 02/03/2017 15:43 Full Code [id number]  Harrie Foreman, MD Inpatient   07/24/2016 22:51 08/04/2016 18:37 Full Code [id number]  Theodoro Grist, MD Inpatient   03/11/2016 04:58 03/19/2016 16:44 Full Code [id number]  Harrie Foreman, MD Inpatient   01/17/2016 02:50 01/27/2016 17:47 Full Code [id number]  Harrie Foreman, MD Inpatient   08/01/2015 23:35 08/15/2015 16:43 Full Code [id number]  Lytle Butte, MD ED   07/21/2014 11:46 07/27/2014 18:16 Full Code [id number]  Oswald Hillock, MD Inpatient     Family Communication: As per critical care specialist Disposition Plan: To be determined  Consultants:  Critical care specialist   Time spent: 24 minutes  [place]

## 2017-10-18 NOTE — Progress Notes (Signed)
Pharmacy Electrolyte Monitoring Consult:  Pharmacy consulted to assist in monitoring and replacing electrolytes in this 42 y.o. male admitted on 10/16/2017 with Abdominal Pain and Emesis Patient is on insulin drip for hypertriglyceridemia.   Labs:  Sodium (mmol/L)  Date Value  10/18/2017 134 (L)  11/04/2014 137   Potassium (mmol/L)  Date Value  10/18/2017 3.8  11/04/2014 3.6   Magnesium (mg/dL)  Date Value  10/18/2017 1.8  03/18/2014 1.9   Phosphorus (mg/dL)  Date Value  05/21/2017 4.5   Calcium (mg/dL)  Date Value  10/18/2017 8.5 (L)   Calcium, Total (mg/dL)  Date Value  11/04/2014 8.5   Albumin (g/dL)  Date Value  10/17/2017 3.9  11/01/2014 3.0 (L)    Assessment/Plan: Electrolytes are WNL. Will recheck AM labs.   Sergio [name], Sergio [name] 10/18/2017 5:00 PM

## 2017-10-18 NOTE — Progress Notes (Signed)
[facility] Progess Note    SYNOPSIS   Sergio [name]  is a 42 y.o. male with a known history of hypertriglyceridemia, diabetes mellitus, pancreatitis, hypertension presented to the emergency room with abdominal pain, nausea and vomiting.   patient was noted to have elevated triglyceride level of 4900, admitted for insulin infusion and close monitoring of blood sugar   ASSESSMENT/PLAN   Severe hypertriglyceridemia. Continue on insulin infusion, pending morning lipid panel. Adjusting D5 with insulin infusion to prevent hypoglycemia. Patient is also on Lopid and has been on statin therapy. We'll continue insulin infusion until lipid panel reveals level less than 500.  Obstructive sleep apnea. Uses nasal CPAP routinely when sleeping INTAKE / OUTPUT:  Intake/Output Summary (Last 24 hours) at 10/18/2017 0759 Last data filed at 10/18/2017 0600 Gross per 24 hour  Intake 3068.49 ml  Output 1750 ml  Net 1318.49 ml    Name: Sergio [name] MRN: [id number] DOB: 18-Oct-1975    ADMISSION DATE:  10/16/2017   SUBJECTIVE:   Patient complaining of abdominal pain, presently wearing his CPAP, states the pain is epigastric in nature and typical for his pancreatitis  VITAL SIGNS: Temp:  [98 F (36.7 C)-98.3 F (36.8 C)] 98.2 F (36.8 C) (01/08 0625) Pulse Rate:  [62-79] 75 (01/08 0700) Resp:  [5-19] 8 (01/08 0700) BP: (102-133)/(63-94) 122/78 (01/08 0625) SpO2:  [90 %-97 %] 93 % (01/08 0700)   PHYSICAL EXAMINATION: Physical Examination:   VS: BP 122/78   Pulse 75   Temp 98.2 F (36.8 C) (Oral)   Resp (!) 8   Ht 5\' 10"  (1.778 m)   Wt 111.9 kg (246 lb 11.1 oz)   SpO2 93%   BMI 35.40 kg/m   General Appearance: No distress  Neuro:without focal findings, mental status normal. HEENT: PERRLA, EOM intact. Pulmonary: normal breath sounds   CardiovascularNormal S1,S2.  No m/r/g.   Abdomen: Epigastric pain with palpation, positive bowel sounds, remainder of the exam is  stable Skin:   warm, no rashes, no ecchymosis  Extremities: normal, no cyanosis, clubbing.    LABORATORY PANEL:   CBC Recent Labs  Lab 10/18/17 0528  WBC 4.7  HGB 13.9  HCT 41.0  PLT 147*    Chemistries  Recent Labs  Lab 10/17/17 0404  10/18/17 0528  NA 138  --  134*  K 3.2*   < > 3.8  CL 100*  --  98*  CO2 27  --  30  GLUCOSE 68  --  99  BUN 9  --  6  CREATININE 0.65  --  0.80  CALCIUM 8.4*  --  8.5*  MG 1.8  --  1.8  AST 24  --   --   ALT 19  --   --   ALKPHOS 44  --   --   BILITOT 0.9  --   --    < > = values in this interval not displayed.    Recent Labs  Lab 10/18/17 0154 10/18/17 0302 10/18/17 0406 10/18/17 0525 10/18/17 0635 10/18/17 0729  GLUCAP 92 102* 93 85 97 105*   No results for input(s): PHART, PCO2ART, PO2ART in the last 168 hours. Recent Labs  Lab 10/16/17 0335 10/17/17 0404  AST 36 24  ALT 26 19  ALKPHOS 48 44  BILITOT 1.6* 0.9  ALBUMIN 4.6 3.9    Cardiac Enzymes No results for input(s): TROPONINI in the last 168 hours.  RADIOLOGY:  No results found.  Hermelinda Dellen, DO  10/18/2017 

## 2017-10-19 LAB — BASIC METABOLIC PANEL
ANION GAP: 6 (ref 5–15)
BUN: 6 mg/dL (ref 6–20)
CALCIUM: 8.4 mg/dL — AB (ref 8.9–10.3)
CO2: 30 mmol/L (ref 22–32)
CREATININE: 0.81 mg/dL (ref 0.61–1.24)
Chloride: 98 mmol/L — ABNORMAL LOW (ref 101–111)
GFR calc Af Amer: >60 mL/min (ref >60)
GLUCOSE: 145 mg/dL — AB (ref 65–99)
Potassium: 3.6 mmol/L (ref 3.5–5.1)
Sodium: 134 mmol/L — ABNORMAL LOW (ref 135–145)

## 2017-10-19 LAB — GLUCOSE, CAPILLARY
GLUCOSE-CAPILLARY: 125 mg/dL — AB (ref 65–99)
GLUCOSE-CAPILLARY: 136 mg/dL — AB (ref 65–99)
GLUCOSE-CAPILLARY: 146 mg/dL — AB (ref 65–99)
GLUCOSE-CAPILLARY: 143 mg/dL — AB (ref 65–99)
GLUCOSE-CAPILLARY: 146 mg/dL — AB (ref 65–99)
GLUCOSE-CAPILLARY: 103 mg/dL — AB (ref 65–99)
GLUCOSE-CAPILLARY: 98 mg/dL (ref 65–99)
Glucose-Capillary: 100 mg/dL — ABNORMAL HIGH (ref 65–99)
Glucose-Capillary: 101 mg/dL — ABNORMAL HIGH (ref 65–99)
Glucose-Capillary: 103 mg/dL — ABNORMAL HIGH (ref 65–99)
Glucose-Capillary: 104 mg/dL — ABNORMAL HIGH (ref 65–99)
Glucose-Capillary: 110 mg/dL — ABNORMAL HIGH (ref 65–99)
Glucose-Capillary: 114 mg/dL — ABNORMAL HIGH (ref 65–99)
Glucose-Capillary: 117 mg/dL — ABNORMAL HIGH (ref 65–99)
Glucose-Capillary: 121 mg/dL — ABNORMAL HIGH (ref 65–99)
Glucose-Capillary: 135 mg/dL — ABNORMAL HIGH (ref 65–99)
Glucose-Capillary: 151 mg/dL — ABNORMAL HIGH (ref 65–99)

## 2017-10-19 LAB — TRIGLYCERIDES: TRIGLYCERIDES: 1273 mg/dL — AB (ref <150)

## 2017-10-19 MED ORDER — TRAZODONE HCL 50 MG PO TABS
100.0000 mg | ORAL_TABLET | Freq: Every day | ORAL | Status: DC
  Administered 2017-10-19 – 2017-10-22 (×4): 100 mg via ORAL
  Filled 2017-10-19 (×2): qty 1
  Filled 2017-10-19: qty 2

## 2017-10-19 NOTE — Progress Notes (Signed)
Pt concerned about triglyceride goal of 500.  He states that his "normal goal" for discontinuing the insulin gtt is 1000.  Encouraged him to talk to the critical care physician in the am.  Also spoke with Hinton Dyer, NP about his concerns.  Pt states that he is ok with waiting until the am.

## 2017-10-19 NOTE — Progress Notes (Signed)
Patient ID: Sergio [name], male   DOB: 05/14/1976, 42 y.o.   MRN: [id number]   [facility] PROGRESS NOTE  Sergio [name] YQM:[id number] DOB: [birth year] DOA: 10/16/2017 PCP: Crecencio Mc, MD  HPI/Subjective: Patient still with some nausea.  No vomiting.  Having epigastric abdominal pain.  Still has not had a bowel movement.  Objective: Vitals:   10/19/17 1300 10/19/17 1400  BP:  140/83  Pulse: 75 83  Resp: 11 13  Temp: 98.7 F (37.1 C)   SpO2: 90% 93%    Filed Weights   10/16/17 0121 10/16/17 0753  Weight: 113.4 kg (250 lb) 111.9 kg (246 lb 11.1 oz)    ROS: Review of Systems  Constitutional: Negative for chills and fever.  Eyes: Negative for blurred vision.  Respiratory: Negative for cough and shortness of breath.   Cardiovascular: Negative for chest pain.  Gastrointestinal: Positive for abdominal pain, constipation and nausea. Negative for diarrhea and vomiting.  Genitourinary: Negative for dysuria.  Musculoskeletal: Negative for joint pain.  Neurological: Negative for dizziness and headaches.   Exam: Physical Exam  Constitutional: He is oriented to person, place, and time.  HENT:  Nose: No mucosal edema.  Mouth/Throat: No oropharyngeal exudate or posterior oropharyngeal edema.  Eyes: Conjunctivae, EOM and lids are normal. Pupils are equal, round, and reactive to light.  Neck: No JVD present. Carotid bruit is not present. No edema present. No thyroid mass and no thyromegaly present.  Cardiovascular: S1 normal and S2 normal. Exam reveals no gallop.  No murmur heard. Pulses:      Dorsalis pedis pulses are 2+ on the right side, and 2+ on the left side.  Respiratory: No respiratory distress. He has decreased breath sounds in the right lower field and the left lower field. He has no wheezes. He has no rhonchi. He has no rales.  GI: Soft. Bowel sounds are normal. There is tenderness in the epigastric area.  Musculoskeletal:       Right ankle: He exhibits no  swelling.       Left ankle: He exhibits no swelling.  Lymphadenopathy:    He has no cervical adenopathy.  Neurological: He is alert and oriented to person, place, and time. No cranial nerve deficit.  Skin: Skin is warm. No rash noted. Nails show no clubbing.  Psychiatric: He has a normal mood and affect.      Data Reviewed: Basic Metabolic Panel: Recent Labs  Lab 10/16/17 0335 10/17/17 0404 10/17/17 1757 10/18/17 0528 10/19/17 0451  NA 138 138  --  134* 134*  K 5.0 3.2* 3.8 3.8 3.6  CL 104 100*  --  98* 98*  CO2 20* 27  --  30 30  GLUCOSE 182* 68  --  99 145*  BUN 11 9  --  6 6  CREATININE 0.79 0.65  --  0.80 0.81  CALCIUM 9.2 8.4*  --  8.5* 8.4*  MG  --  1.8  --  1.8  --    Liver Function Tests: Recent Labs  Lab 10/16/17 0335 10/17/17 0404  AST 36 24  ALT 26 19  ALKPHOS 48 44  BILITOT 1.6* 0.9  PROT 7.5 7.1  ALBUMIN 4.6 3.9   Recent Labs  Lab 10/16/17 0335 10/18/17 0528  LIPASE 22 16   CBC: Recent Labs  Lab 10/16/17 0125 10/17/17 0404 10/18/17 0528  WBC 5.6 5.9 4.7  HGB 14.5 14.0 13.9  HCT 42.3 40.1 41.0  MCV 80.8 82.0 82.6  PLT 218 147*  147*    CBG: Recent Labs  Lab 10/19/17 0724 10/19/17 0833 10/19/17 1104 10/19/17 1311 10/19/17 1447  GLUCAP 146* 143* 146* 103* 103*    Recent Results (from the past 240 hour(s))  MRSA PCR Screening     Status: None   Collection Time: 10/16/17  7:51 AM  Result Value Ref Range Status   MRSA by PCR NEGATIVE NEGATIVE Final    Comment:        The GeneXpert MRSA Assay (FDA approved for NASAL specimens only), is one component of a comprehensive MRSA colonization surveillance program. It is not intended to diagnose MRSA infection nor to guide or monitor treatment for MRSA infections. Performed at [facility], [address] [place], [place] [address]       Scheduled Meds: . finasteride  5 mg Oral QHS  . gemfibrozil  600 mg Oral BID AC  . mouth rinse  15 mL Mouth Rinse BID  . omega-3  acid ethyl esters  4 g Oral QHS  . pregabalin  300 mg Oral QHS  . rosuvastatin  20 mg Oral q1800  . sertraline  100 mg Oral Daily  . tamsulosin  0.4 mg Oral QHS  . traZODone  200 mg Oral QHS   Continuous Infusions: . dextrose 125 mL/hr at 10/19/17 1500  . insulin (NOVOLIN-R) infusion 10 mL/hr at 10/19/17 1500    Assessment/Plan:  1. Severe hypertriglyceridemia with acute on chronic pancreatitis.  Continue insulin drip until triglycerides less than 1000.  As needed pain medications.  Continue gemfibrozil.  N.p.o. except for meds. 2. Hypokalemia.  Replaced. 3. Urinary retention with Foley catheter.  On finasteride and Flomax.  Patient states once he is off the pain medications he usually is able to urinate. 4. Diabetes mellitus.  On D10 drip secondary to insulin drip. 5. Depression on Zoloft 6. Sleep apnea wears CPAP in bed 7. Morbid obesity- weight loss needed  Code Status:     Code Status Orders  (From admission, onward)        Start     Ordered   10/16/17 0754  Full code  Continuous     10/16/17 0753    Code Status History    Date Active Date Inactive Code Status Order ID Comments User Context   05/13/2017 15:24 05/23/2017 20:06 Full Code [id number]  [name], MD Inpatient   01/28/2017 08:40 02/03/2017 15:43 Full Code [id number]  Harrie Foreman, MD Inpatient   07/24/2016 22:51 08/04/2016 18:37 Full Code [id number]  Theodoro Grist, MD Inpatient   03/11/2016 04:58 03/19/2016 16:44 Full Code [id number]  Harrie Foreman, MD Inpatient   01/17/2016 02:50 01/27/2016 17:47 Full Code [id number]  Harrie Foreman, MD Inpatient   08/01/2015 23:35 08/15/2015 16:43 Full Code [id number]  Lytle Butte, MD ED   07/21/2014 11:46 07/27/2014 18:16 Full Code [id number]  Oswald Hillock, MD Inpatient     Family Communication: As per critical care specialist Disposition Plan: To be determined  Consultants:  Critical care specialist   Time spent: 22 minutes  [place]

## 2017-10-19 NOTE — Progress Notes (Signed)
[facility] Progess Note    SYNOPSIS   Sergio [name]  is a 42 y.o. male with a known history of hypertriglyceridemia, diabetes mellitus, pancreatitis, hypertension presented to the emergency room with abdominal pain, nausea and vomiting.   patient was noted to have elevated triglyceride level of 4900, admitted for insulin infusion and close monitoring of blood sugar   ASSESSMENT/PLAN   Severe hypertriglyceridemia. Triglyceride decreasing, now 1273. Continue on insulin infusion, pending morning lipid panel. Adjusting D5 with insulin infusion to prevent hypoglycemia. Patient is also on Lopid and has been on statin therapy. We'll continue insulin infusion until lipid panel reveals level less than 500.  Obstructive sleep apnea. Uses nasal CPAP routinely when sleeping INTAKE / OUTPUT:  Intake/Output Summary (Last 24 hours) at 10/19/2017 0804 Last data filed at 10/19/2017 0700 Gross per 24 hour  Intake 2910 ml  Output 5850 ml  Net -2940 ml    Name: Sergio [name] MRN: [id number] DOB: 12-10-1975    ADMISSION DATE:  10/16/2017   SUBJECTIVE:   Patient states abdominal pain may be somewhat improved, presently wearing his CPAP  VITAL SIGNS: Temp:  [98.2 F (36.8 C)-98.8 F (37.1 C)] 98.3 F (36.8 C) (01/09 0725) Pulse Rate:  [71-89] 79 (01/09 0725) Resp:  [6-23] 12 (01/09 0725) BP: (124-135)/(62-85) 132/83 (01/09 0600) SpO2:  [87 %-96 %] 95 % (01/09 0725)   PHYSICAL EXAMINATION: Physical Examination:   VS: BP 132/83   Pulse 79   Temp 98.3 F (36.8 C) (Oral)   Resp 12   Ht 5\' 10"  (1.778 m)   Wt 111.9 kg (246 lb 11.1 oz)   SpO2 95%   BMI 35.40 kg/m   General Appearance: No distress  Neuro:without focal findings, mental status normal. HEENT: PERRLA, EOM intact. Pulmonary: normal breath sounds   CardiovascularNormal S1,S2.  No m/r/g.   Abdomen: Epigastric pain with palpation, positive bowel sounds, remainder of the exam is stable Skin:   warm, no  rashes, no ecchymosis  Extremities: normal, no cyanosis, clubbing.    LABORATORY PANEL:   CBC Recent Labs  Lab 10/18/17 0528  WBC 4.7  HGB 13.9  HCT 41.0  PLT 147*    Chemistries  Recent Labs  Lab 10/17/17 0404  10/18/17 0528 10/19/17 0451  NA 138  --  134* 134*  K 3.2*   < > 3.8 3.6  CL 100*  --  98* 98*  CO2 27  --  30 30  GLUCOSE 68  --  99 145*  BUN 9  --  6 6  CREATININE 0.65  --  0.80 0.81  CALCIUM 8.4*  --  8.5* 8.4*  MG 1.8  --  1.8  --   AST 24  --   --   --   ALT 19  --   --   --   ALKPHOS 44  --   --   --   BILITOT 0.9  --   --   --    < > = values in this interval not displayed.    Recent Labs  Lab 10/19/17 0057 10/19/17 0158 10/19/17 0301 10/19/17 0505 10/19/17 0605 10/19/17 0724  GLUCAP 104* 125* 135* 151* 136* 146*   No results for input(s): PHART, PCO2ART, PO2ART in the last 168 hours. Recent Labs  Lab 10/16/17 0335 10/17/17 0404  AST 36 24  ALT 26 19  ALKPHOS 48 44  BILITOT 1.6* 0.9  ALBUMIN 4.6 3.9    Cardiac Enzymes No  results for input(s): TROPONINI in the last 168 hours.  RADIOLOGY:  No results found.  Hermelinda Dellen, DO  10/19/2017

## 2017-10-19 NOTE — Progress Notes (Signed)
Met with patient today.  Note IV Insulin drip to continue until Triglycerides reach a certain level.  Discussed with patient the above information.  Patient stated he can have family bring his pump and pump supplies for transition to SQ insulin therapy when the MD allows him to do so.  Will Follow.    --Will follow patient during hospitalization--  Wyn Quaker RN, MSN, CDE Diabetes Coordinator Inpatient Glycemic Control Team Team Pager: [phone] (8a-5p)

## 2017-10-20 LAB — GLUCOSE, CAPILLARY
GLUCOSE-CAPILLARY: 104 mg/dL — AB (ref 65–99)
GLUCOSE-CAPILLARY: 111 mg/dL — AB (ref 65–99)
GLUCOSE-CAPILLARY: 112 mg/dL — AB (ref 65–99)
GLUCOSE-CAPILLARY: 114 mg/dL — AB (ref 65–99)
GLUCOSE-CAPILLARY: 123 mg/dL — AB (ref 65–99)
GLUCOSE-CAPILLARY: 129 mg/dL — AB (ref 65–99)
GLUCOSE-CAPILLARY: 129 mg/dL — AB (ref 65–99)
GLUCOSE-CAPILLARY: 134 mg/dL — AB (ref 65–99)
GLUCOSE-CAPILLARY: 90 mg/dL (ref 65–99)
Glucose-Capillary: 125 mg/dL — ABNORMAL HIGH (ref 65–99)
Glucose-Capillary: 133 mg/dL — ABNORMAL HIGH (ref 65–99)
Glucose-Capillary: 131 mg/dL — ABNORMAL HIGH (ref 65–99)
Glucose-Capillary: 134 mg/dL — ABNORMAL HIGH (ref 65–99)
Glucose-Capillary: 109 mg/dL — ABNORMAL HIGH (ref 65–99)
Glucose-Capillary: 93 mg/dL (ref 65–99)

## 2017-10-20 LAB — BASIC METABOLIC PANEL
Anion gap: 6 (ref 5–15)
BUN: 7 mg/dL (ref 6–20)
CALCIUM: 8.6 mg/dL — AB (ref 8.9–10.3)
CO2: 31 mmol/L (ref 22–32)
CREATININE: 0.87 mg/dL (ref 0.61–1.24)
Chloride: 96 mmol/L — ABNORMAL LOW (ref 101–111)
GFR calc Af Amer: >60 mL/min (ref >60)
GLUCOSE: 137 mg/dL — AB (ref 65–99)
Potassium: 3.4 mmol/L — ABNORMAL LOW (ref 3.5–5.1)
SODIUM: 133 mmol/L — AB (ref 135–145)

## 2017-10-20 LAB — POTASSIUM: POTASSIUM: 3.8 mmol/L (ref 3.5–5.1)

## 2017-10-20 LAB — TRIGLYCERIDES: TRIGLYCERIDES: 1047 mg/dL — AB (ref <150)

## 2017-10-20 MED ORDER — POTASSIUM CHLORIDE CRYS ER 20 MEQ PO TBCR
20.0000 meq | EXTENDED_RELEASE_TABLET | Freq: Once | ORAL | Status: AC
  Administered 2017-10-20: 20 meq via ORAL
  Filled 2017-10-20: qty 1

## 2017-10-20 MED ORDER — POTASSIUM CHLORIDE CRYS ER 20 MEQ PO TBCR
40.0000 meq | EXTENDED_RELEASE_TABLET | Freq: Once | ORAL | Status: AC
  Administered 2017-10-20: 40 meq via ORAL
  Filled 2017-10-20: qty 2

## 2017-10-20 NOTE — Progress Notes (Signed)
Pharmacy Electrolyte Monitoring Consult:  Pharmacy consulted to assist in monitoring and replacing electrolytes in this 42 y.o. male admitted on 10/16/2017 with Abdominal Pain and Emesis Patient is on insulin drip for hypertriglyceridemia.   Labs:  Sodium (mmol/L)  Date Value  10/20/2017 133 (L)  11/04/2014 137   Potassium (mmol/L)  Date Value  10/20/2017 3.4 (L)  11/04/2014 3.6   Magnesium (mg/dL)  Date Value  10/18/2017 1.8  03/18/2014 1.9   Phosphorus (mg/dL)  Date Value  05/21/2017 4.5   Calcium (mg/dL)  Date Value  10/20/2017 8.6 (L)   Calcium, Total (mg/dL)  Date Value  11/04/2014 8.5   Albumin (g/dL)  Date Value  10/17/2017 3.9  11/01/2014 3.0 (L)    Assessment/Plan: Will replace K and recheck this PM as K has been trending down with patient NPO on insulin drip.   Chandler, Swiders[place] D 10/20/2017 12:58 PM

## 2017-10-20 NOTE — Progress Notes (Deleted)
   10/20/17 0615  Vitals  BP (!) 75/35  MAP (mmHg) (!) 50  Pulse Rate 71  ECG Heart Rate 71   Notified Dana, NP.  Orders received.

## 2017-10-20 NOTE — Progress Notes (Signed)
[facility] Progess Note    SYNOPSIS   Sergio [name]  is a 42 y.o. male with a known history of hypertriglyceridemia, diabetes mellitus, pancreatitis, hypertension presented to the emergency room with abdominal pain, nausea and vomiting.   patient was noted to have elevated triglyceride level of 4900, admitted for insulin infusion and close monitoring of blood sugar   ASSESSMENT/PLAN   Severe hypertriglyceridemia. Triglyceride decreasing, now 1047. Continue on insulin infusion, pending morning lipid panel. Adjusting D5 with insulin infusion to prevent hypoglycemia. Patient is also on Lopid and has been on statin therapy. We'll continue insulin infusion until lipid panel reveals level less than 500.  Obstructive sleep apnea. Uses nasal CPAP routinely when sleeping INTAKE / OUTPUT:  Intake/Output Summary (Last 24 hours) at 10/20/2017 0816 Last data filed at 10/20/2017 0400 Gross per 24 hour  Intake 3597.46 ml  Output 2875 ml  Net 722.46 ml    Name: Sergio [name] MRN: [id number] DOB: 05/28/1976    ADMISSION DATE:  10/16/2017   SUBJECTIVE:   Patient states abdominal pain may be somewhat improved, presently wearing his CPAP  VITAL SIGNS: Temp:  [97.9 F (36.6 C)-98.7 F (37.1 C)] 98.2 F (36.8 C) (01/10 0200) Pulse Rate:  [65-83] 70 (01/10 0616) Resp:  [6-26] 7 (01/10 0616) BP: (100-140)/(71-85) 100/71 (01/10 0433) SpO2:  [90 %-96 %] 94 % (01/10 0616)   PHYSICAL EXAMINATION: Physical Examination:   VS: BP 100/71   Pulse 70   Temp 98.2 F (36.8 C) (Axillary)   Resp (!) 7   Ht 5\' 10"  (1.778 m)   Wt 111.9 kg (246 lb 11.1 oz)   SpO2 94%   BMI 35.40 kg/m   General Appearance: No distress  Neuro:without focal findings, mental status normal. HEENT: PERRLA, EOM intact. Pulmonary: normal breath sounds   CardiovascularNormal S1,S2.  No m/r/g.   Abdomen: Epigastric pain with palpation, positive bowel sounds, remainder of the exam is stable Skin:    warm, no rashes, no ecchymosis  Extremities: normal, no cyanosis, clubbing.    LABORATORY PANEL:   CBC Recent Labs  Lab 10/18/17 0528  WBC 4.7  HGB 13.9  HCT 41.0  PLT 147*    Chemistries  Recent Labs  Lab 10/17/17 0404  10/18/17 0528  10/20/17 0526  NA 138  --  134*   < > 133*  K 3.2*   < > 3.8   < > 3.4*  CL 100*  --  98*   < > 96*  CO2 27  --  30   < > 31  GLUCOSE 68  --  99   < > 137*  BUN 9  --  6   < > 7  CREATININE 0.65  --  0.80   < > 0.87  CALCIUM 8.4*  --  8.5*   < > 8.6*  MG 1.8  --  1.8  --   --   AST 24  --   --   --   --   ALT 19  --   --   --   --   ALKPHOS 44  --   --   --   --   BILITOT 0.9  --   --   --   --    < > = values in this interval not displayed.    Recent Labs  Lab 10/20/17 0220 10/20/17 0335 10/20/17 0431 10/20/17 0509 10/20/17 0609 10/20/17 0706  GLUCAP 125* 134* 129* 133* 131*  111*   No results for input(s): PHART, PCO2ART, PO2ART in the last 168 hours. Recent Labs  Lab 10/16/17 0335 10/17/17 0404  AST 36 24  ALT 26 19  ALKPHOS 48 44  BILITOT 1.6* 0.9  ALBUMIN 4.6 3.9    Cardiac Enzymes No results for input(s): TROPONINI in the last 168 hours.  RADIOLOGY:  No results found.  Hermelinda Dellen, DO  10/20/2017

## 2017-10-20 NOTE — Progress Notes (Signed)
[place] [facility] [place] NAME: Gryffin Altice    MR#:  [id number]  DATE OF BIRTH:  1976-07-07  SUBJECTIVE:   Abdominal pain slightly improving  REVIEW OF SYSTEMS:    Review of Systems  Constitutional: Negative for fever, chills weight loss HENT: Negative for ear pain, nosebleeds, congestion, facial swelling, rhinorrhea, neck pain, neck stiffness and ear discharge.   Respiratory: Negative for cough, shortness of breath, wheezing  Cardiovascular: Negative for chest pain, palpitations and leg swelling.  Gastrointestinal: Negative for heartburn, ositos abdominal pain, no vomiting, diarrhea or consitpation Genitourinary: Negative for dysuria, urgency, frequency, hematuria Musculoskeletal: Negative for back pain or joint pain Neurological: Negative for dizziness, seizures, syncope, focal weakness,  numbness and headaches.  Hematological: Does not bruise/bleed easily.  Psychiatric/Behavioral: Negative for hallucinations, confusion, dysphoric mood    Tolerating Diet: npo      DRUG ALLERGIES:   Allergies  Allergen Reactions  . Codeine Anaphylaxis  . Ivp Dye [Iodinated Diagnostic Agents] Other (See Comments)    Kidneys stop working    VITALS:  Blood pressure 113/80, pulse 71, temperature 97.6 F (36.4 C), temperature source Axillary, resp. rate 12, height 5\' 10"  (1.778 m), weight 111.9 kg (246 lb 11.1 oz), SpO2 93 %.  PHYSICAL EXAMINATION:  Constitutional: Appears well-developed and well-nourished. No distress. HENT: Normocephalic. [name] Oropharynx is clear and moist.  Eyes: Conjunctivae and EOM are normal. PERRLA, no scleral icterus.  Neck: Normal ROM. Neck supple. No JVD. No tracheal deviation. CVS: RRR, S1/S2 +, no murmurs, no gallops, no carotid bruit.  Pulmonary: Effort and breath sounds normal, no stridor, rhonchi, wheezes, rales.  Abdominal: Soft. BS +,  no distension, tenderness, rebound or guarding.  Musculoskeletal: Normal range of  motion. No edema and no tenderness.  Neuro: Alert. CN 2-12 grossly intact. No focal deficits. Skin: Skin is warm and dry. No rash noted. Psychiatric: Normal mood and affect.      LABORATORY PANEL:   CBC Recent Labs  Lab 10/18/17 0528  WBC 4.7  HGB 13.9  HCT 41.0  PLT 147*   ------------------------------------------------------------------------------------------------------------------  Chemistries  Recent Labs  Lab 10/17/17 0404  10/18/17 0528  10/20/17 0526  NA 138  --  134*   < > 133*  K 3.2*   < > 3.8   < > 3.4*  CL 100*  --  98*   < > 96*  CO2 27  --  30   < > 31  GLUCOSE 68  --  99   < > 137*  BUN 9  --  6   < > 7  CREATININE 0.65  --  0.80   < > 0.87  CALCIUM 8.4*  --  8.5*   < > 8.6*  MG 1.8  --  1.8  --   --   AST 24  --   --   --   --   ALT 19  --   --   --   --   ALKPHOS 44  --   --   --   --   BILITOT 0.9  --   --   --   --    < > = values in this interval not displayed.   ------------------------------------------------------------------------------------------------------------------  Cardiac Enzymes No results for input(s): TROPONINI in the last 168 hours. ------------------------------------------------------------------------------------------------------------------  RADIOLOGY:  No results found.   ASSESSMENT AND PLAN:   42 year old male with history of diabetes and hypertriglyceridemia who presented with nausea and  vomiting and found to have elevated triglyceride level of 4900.  1. Hypertriglyceridemia: Patient is treated with D5 with insulin infusion Continue this and follow triglyceride levels Continue gemfibrozil and Crestor and omega-3  2. Diabetes: Patient currently in insulin infusion with D5  3. BPH: Continue Flomax and finasteride  4. OSA: Continue CPAP  5. Depression/anxiety: Continue Zoloft and Xanax  6. Morbid obesity: Weight loss is encouraged  Management plans discussed with the patient and he is in  agreement.  CODE STATUS: Full  TOTAL TIME TAKING CARE OF THIS PATIENT: 24 minutes.     POSSIBLE D/C 2-4 days, DEPENDING ON CLINICAL CONDITION.   Jalyah Weinheimer M.D on 10/20/2017 at 12:48 PM  Between 7am to 6pm - Pager - [phone] After 6pm go to [email or web address] - password [email or web address] [facility]  Office  [phone]  CC: Primary care physician; Crecencio Mc, MD  Note: This dictation was prepared with Dragon dictation along with smaller phrase technology. Any transcriptional errors that result from this process are unintentional.

## 2017-10-20 NOTE — Progress Notes (Signed)
Pharmacy Electrolyte Monitoring Consult:  Pharmacy consulted to assist in monitoring and replacing electrolytes in this 42 y.o. male admitted on 10/16/2017 with Abdominal Pain and Emesis Patient is on insulin drip for hypertriglyceridemia.   Labs:  Sodium (mmol/L)  Date Value  10/20/2017 133 (L)  11/04/2014 137   Potassium (mmol/L)  Date Value  10/20/2017 3.8  11/04/2014 3.6   Magnesium (mg/dL)  Date Value  10/18/2017 1.8  03/18/2014 1.9   Phosphorus (mg/dL)  Date Value  05/21/2017 4.5   Calcium (mg/dL)  Date Value  10/20/2017 8.6 (L)   Calcium, Total (mg/dL)  Date Value  11/04/2014 8.5   Albumin (g/dL)  Date Value  10/17/2017 3.9  11/01/2014 3.0 (L)    Assessment/Plan: K is up to 3.8. I will give an additional 20 MEQ tonight since pt is still on insulin drip. Recheck with AM labs  Ramond Dial, Pharm.D, BCPS Clinical Pharmacist  10/20/2017 7:27 PM

## 2017-10-21 LAB — GLUCOSE, CAPILLARY
GLUCOSE-CAPILLARY: 105 mg/dL — AB (ref 65–99)
GLUCOSE-CAPILLARY: 109 mg/dL — AB (ref 65–99)
GLUCOSE-CAPILLARY: 113 mg/dL — AB (ref 65–99)
GLUCOSE-CAPILLARY: 117 mg/dL — AB (ref 65–99)
GLUCOSE-CAPILLARY: 122 mg/dL — AB (ref 65–99)
GLUCOSE-CAPILLARY: 126 mg/dL — AB (ref 65–99)
GLUCOSE-CAPILLARY: 131 mg/dL — AB (ref 65–99)
GLUCOSE-CAPILLARY: 80 mg/dL (ref 65–99)
Glucose-Capillary: 102 mg/dL — ABNORMAL HIGH (ref 65–99)
Glucose-Capillary: 107 mg/dL — ABNORMAL HIGH (ref 65–99)
Glucose-Capillary: 113 mg/dL — ABNORMAL HIGH (ref 65–99)
Glucose-Capillary: 114 mg/dL — ABNORMAL HIGH (ref 65–99)
Glucose-Capillary: 115 mg/dL — ABNORMAL HIGH (ref 65–99)
Glucose-Capillary: 137 mg/dL — ABNORMAL HIGH (ref 65–99)
Glucose-Capillary: 137 mg/dL — ABNORMAL HIGH (ref 65–99)
Glucose-Capillary: 98 mg/dL (ref 65–99)

## 2017-10-21 LAB — BASIC METABOLIC PANEL
ANION GAP: 9 (ref 5–15)
BUN: 8 mg/dL (ref 6–20)
CHLORIDE: 95 mmol/L — AB (ref 101–111)
CO2: 31 mmol/L (ref 22–32)
Calcium: 8.9 mg/dL (ref 8.9–10.3)
Creatinine, Ser: 1.02 mg/dL (ref 0.61–1.24)
GFR calc Af Amer: >60 mL/min (ref >60)
GFR calc non Af Amer: >60 mL/min (ref >60)
Glucose, Bld: 120 mg/dL — ABNORMAL HIGH (ref 65–99)
POTASSIUM: 3.6 mmol/L (ref 3.5–5.1)
Sodium: 135 mmol/L (ref 135–145)

## 2017-10-21 LAB — TRIGLYCERIDES
Triglycerides: 886 mg/dL — ABNORMAL HIGH (ref <150)
Triglycerides: 822 mg/dL — ABNORMAL HIGH (ref <150)

## 2017-10-21 LAB — MAGNESIUM: Magnesium: 1.7 mg/dL (ref 1.7–2.4)

## 2017-10-21 MED ORDER — POTASSIUM CHLORIDE CRYS ER 20 MEQ PO TBCR
40.0000 meq | EXTENDED_RELEASE_TABLET | Freq: Once | ORAL | Status: AC
  Administered 2017-10-21: 40 meq via ORAL
  Filled 2017-10-21: qty 2

## 2017-10-21 NOTE — Progress Notes (Signed)
Pharmacy Electrolyte Monitoring Consult:  Pharmacy consulted to assist in monitoring and replacing electrolytes in this 42 y.o. male admitted on 10/16/2017 with Abdominal Pain and Emesis Patient is on insulin drip for hypertriglyceridemia.   Labs:  Sodium (mmol/L)  Date Value  10/21/2017 135  11/04/2014 137   Potassium (mmol/L)  Date Value  10/21/2017 3.6  11/04/2014 3.6   Magnesium (mg/dL)  Date Value  10/21/2017 1.7  03/18/2014 1.9   Phosphorus (mg/dL)  Date Value  05/21/2017 4.5   Calcium (mg/dL)  Date Value  10/21/2017 8.9   Calcium, Total (mg/dL)  Date Value  11/04/2014 8.5   Albumin (g/dL)  Date Value  10/17/2017 3.9  11/01/2014 3.0 (L)    Assessment/Plan: K resulted at 3.6. Will order 40 MEQ po x 1 since pt is still on insulin drip. Recheck with AM labs  Paulina Fusi, PharmD, BCPS 10/21/2017 3:44 PM

## 2017-10-21 NOTE — Progress Notes (Signed)
[place] at [place] NAME: Sergio [name]    MR#:  [id number]  DATE OF BIRTH:  [birth year]  SUBJECTIVE:   Abdominal pain improving  TG level improving this am  REVIEW OF SYSTEMS:    Review of Systems  Constitutional: Negative for fever, chills weight loss HENT: Negative for ear pain, nosebleeds, congestion, facial swelling, rhinorrhea, neck pain, neck stiffness and ear discharge.   Respiratory: Negative for cough, shortness of breath, wheezing  Cardiovascular: Negative for chest pain, palpitations and leg swelling.  Gastrointestinal: Negative for heartburn, ositos abdominal pain, no vomiting, diarrhea or consitpation Genitourinary: Negative for dysuria, urgency, frequency, hematuria Musculoskeletal: Negative for back pain or joint pain Neurological: Negative for dizziness, seizures, syncope, focal weakness,  numbness and headaches.  Hematological: Does not bruise/bleed easily.  Psychiatric/Behavioral: Negative for hallucinations, confusion, dysphoric mood    Tolerating Diet: clear liquid diet     DRUG ALLERGIES:   Allergies  Allergen Reactions  . Codeine Anaphylaxis  . Ivp Dye [Iodinated Diagnostic Agents] Other (See Comments)    Kidneys stop working    VITALS:  Blood pressure 123/83, pulse 80, temperature 97.8 F (36.6 C), temperature source Axillary, resp. rate (!) 25, height 5\' 10"  (1.778 m), weight 111.9 kg (246 lb 11.1 oz), SpO2 91 %.  PHYSICAL EXAMINATION:  Constitutional: Appears well-developed and well-nourished. No distress. HENT: Normocephalic. [name] Oropharynx is clear and moist.  Eyes: Conjunctivae and EOM are normal. PERRLA, no scleral icterus.  Neck: Normal ROM. Neck supple. No JVD. No tracheal deviation. CVS: RRR, S1/S2 +, no murmurs, no gallops, no carotid bruit.  Pulmonary: Effort and breath sounds normal, no stridor, rhonchi, wheezes, rales.  Abdominal: Soft. BS +,  no distension, tenderness, rebound or guarding.   Musculoskeletal: Normal range of motion. No edema and no tenderness.  Neuro: Alert. CN 2-12 grossly intact. No focal deficits. Skin: Skin is warm and dry. No rash noted. Psychiatric: Normal mood and affect.      LABORATORY PANEL:   CBC Recent Labs  Lab 10/18/17 0528  WBC 4.7  HGB 13.9  HCT 41.0  PLT 147*   ------------------------------------------------------------------------------------------------------------------  Chemistries  Recent Labs  Lab 10/17/17 0404  10/21/17 0545  NA 138   < > 135  K 3.2*   < > 3.6  CL 100*   < > 95*  CO2 27   < > 31  GLUCOSE 68   < > 120*  BUN 9   < > 8  CREATININE 0.65   < > 1.02  CALCIUM 8.4*   < > 8.9  MG 1.8   < > 1.7  AST 24  --   --   ALT 19  --   --   ALKPHOS 44  --   --   BILITOT 0.9  --   --    < > = values in this interval not displayed.   ------------------------------------------------------------------------------------------------------------------  Cardiac Enzymes No results for input(s): TROPONINI in the last 168 hours. ------------------------------------------------------------------------------------------------------------------  RADIOLOGY:  No results found.   ASSESSMENT AND PLAN:   42 year old male with history of diabetes and hypertriglyceridemia who presented with nausea and vomiting and found to have elevated triglyceride level of 4900.  1. Acute pancreatitis due to Hypertriglyceridemia: Patient is treated with D5 with insulin infusion Continue this and follow triglyceride levels Continue gemfibrozil and Crestor and omega-3 Continue clear liquid diet and advance possibly tomorrow  2. Diabetes: Patient currently in insulin infusion with D5  3. BPH:  Continue Flomax and finasteride  4. OSA: Continue CPAP  5. Depression/anxiety: Continue Zoloft and Xanax  6. Morbid obesity: Weight loss is encouraged  Management plans discussed with the patient and he is in agreement.  CODE STATUS:  Full  TOTAL TIME TAKING CARE OF THIS PATIENT: 24 minutes.  Discussed with Dr. Jefferson Fuel   POSSIBLE D/C 2-4 days, DEPENDING ON CLINICAL CONDITION.   Nawal Burling M.D on 10/21/2017 at 8:42 AM  Between 7am to 6pm - Pager - [phone] After 6pm go to [email or web address] - password [email or web address] [facility]  Office  [phone]  CC: Primary care physician; Crecencio Mc, MD  Note: This dictation was prepared with Dragon dictation along with smaller phrase technology. Any transcriptional errors that result from this process are unintentional.

## 2017-10-21 NOTE — Progress Notes (Signed)
[facility] Progess Note    SYNOPSIS   Denarius Sesler  is a 42 y.o. male with a known history of hypertriglyceridemia, diabetes mellitus, pancreatitis, hypertension presented to the emergency room with abdominal pain, nausea and vomiting.   patient was noted to have elevated triglyceride level of 4900, admitted for insulin infusion and close monitoring of blood sugar   ASSESSMENT/PLAN   Severe hypertriglyceridemia. Triglyceride decreasing, now 886. Continue on insulin infusion, pending morning lipid panel. Adjusting D5 with insulin infusion to prevent hypoglycemia. Patient is also on Lopid and has been on statin therapy. We'll continue insulin infusion until lipid panel reveals level less than 500. Will start clear liquid diet today  Obstructive sleep apnea. Uses nasal CPAP routinely when sleeping  INTAKE / OUTPUT:  Intake/Output Summary (Last 24 hours) at 10/21/2017 0802 Last data filed at 10/21/2017 0600 Gross per 24 hour  Intake 3570 ml  Output 3375 ml  Net 195 ml    Name: Sergio [name] MRN: [id number] DOB: 01-17-1976    ADMISSION DATE:  10/16/2017   SUBJECTIVE:   Patient states abdominal pain may be somewhat improved, presently wearing his CPAP  VITAL SIGNS: Temp:  [97.6 F (36.4 C)-98.7 F (37.1 C)] 97.8 F (36.6 C) (01/11 0504) Pulse Rate:  [68-83] 68 (01/11 0626) Resp:  [7-20] 10 (01/11 0626) BP: (102-138)/(73-90) 122/85 (01/11 0626) SpO2:  [90 %-95 %] 92 % (01/11 0626)   PHYSICAL EXAMINATION: Physical Examination:   VS: BP 122/85 (BP Location: Left Arm)   Pulse 68   Temp 97.8 F (36.6 C) (Axillary)   Resp 10   Ht 5\' 10"  (1.778 m)   Wt 111.9 kg (246 lb 11.1 oz)   SpO2 92%   BMI 35.40 kg/m   General Appearance: No distress  Neuro:without focal findings, mental status normal. HEENT: PERRLA, EOM intact. Pulmonary: normal breath sounds   CardiovascularNormal S1,S2.  No m/r/g.   Abdomen: Epigastric pain with palpation, positive  bowel sounds, remainder of the exam is stable Skin:   warm, no rashes, no ecchymosis  Extremities: normal, no cyanosis, clubbing.    LABORATORY PANEL:   CBC Recent Labs  Lab 10/18/17 0528  WBC 4.7  HGB 13.9  HCT 41.0  PLT 147*    Chemistries  Recent Labs  Lab 10/17/17 0404  10/21/17 0545  NA 138   < > 135  K 3.2*   < > 3.6  CL 100*   < > 95*  CO2 27   < > 31  GLUCOSE 68   < > 120*  BUN 9   < > 8  CREATININE 0.65   < > 1.02  CALCIUM 8.4*   < > 8.9  MG 1.8   < > 1.7  AST 24  --   --   ALT 19  --   --   ALKPHOS 44  --   --   BILITOT 0.9  --   --    < > = values in this interval not displayed.    Recent Labs  Lab 10/20/17 2243 10/21/17 0000 10/21/17 0052 10/21/17 0228 10/21/17 0407 10/21/17 0638  GLUCAP 90 105* 98 107* 126* 115*   No results for input(s): PHART, PCO2ART, PO2ART in the last 168 hours. Recent Labs  Lab 10/16/17 0335 10/17/17 0404  AST 36 24  ALT 26 19  ALKPHOS 48 44  BILITOT 1.6* 0.9  ALBUMIN 4.6 3.9    Cardiac Enzymes No results for input(s): TROPONINI in the  last 168 hours.  RADIOLOGY:  No results found.  Hermelinda Dellen, DO  10/21/2017

## 2017-10-21 NOTE — Progress Notes (Signed)
Pt is alert and oriented, with intermittent complaints of abdominal pain, of which pain is relieved with prn dilaudid (pt received 3 prn doses for the shift).  NSR, lungs clear, pt has been on home CPAP for entirety of shift.  Remains NPO and on insulin gtt and D10 infusion.  CBG's have remained stable, no episodes of hypoglycemia.  Adequate urine output from foley.  Vital signs stable, afebrile.  Morning serum triglyceride levels drawn, results currently pending.

## 2017-10-22 DIAGNOSIS — E876 Hypokalemia: Secondary | ICD-10-CM

## 2017-10-22 LAB — TRIGLYCERIDES
Triglycerides: 723 mg/dL — ABNORMAL HIGH (ref <150)
Triglycerides: 740 mg/dL — ABNORMAL HIGH (ref <150)

## 2017-10-22 LAB — GLUCOSE, CAPILLARY
GLUCOSE-CAPILLARY: 118 mg/dL — AB (ref 65–99)
GLUCOSE-CAPILLARY: 132 mg/dL — AB (ref 65–99)
GLUCOSE-CAPILLARY: 105 mg/dL — AB (ref 65–99)
GLUCOSE-CAPILLARY: 128 mg/dL — AB (ref 65–99)
GLUCOSE-CAPILLARY: 137 mg/dL — AB (ref 65–99)
Glucose-Capillary: 126 mg/dL — ABNORMAL HIGH (ref 65–99)
Glucose-Capillary: 132 mg/dL — ABNORMAL HIGH (ref 65–99)
Glucose-Capillary: 135 mg/dL — ABNORMAL HIGH (ref 65–99)
Glucose-Capillary: 139 mg/dL — ABNORMAL HIGH (ref 65–99)
Glucose-Capillary: 170 mg/dL — ABNORMAL HIGH (ref 65–99)
Glucose-Capillary: 185 mg/dL — ABNORMAL HIGH (ref 65–99)

## 2017-10-22 LAB — BASIC METABOLIC PANEL
Anion gap: 5 (ref 5–15)
BUN: 8 mg/dL (ref 6–20)
CO2: 32 mmol/L (ref 22–32)
Calcium: 8.7 mg/dL — ABNORMAL LOW (ref 8.9–10.3)
Chloride: 96 mmol/L — ABNORMAL LOW (ref 101–111)
Creatinine, Ser: 0.97 mg/dL (ref 0.61–1.24)
GFR calc Af Amer: >60 mL/min (ref >60)
GFR calc non Af Amer: >60 mL/min (ref >60)
GLUCOSE: 120 mg/dL — AB (ref 65–99)
POTASSIUM: 3.2 mmol/L — AB (ref 3.5–5.1)
Sodium: 133 mmol/L — ABNORMAL LOW (ref 135–145)

## 2017-10-22 LAB — PHOSPHORUS: PHOSPHORUS: 3.7 mg/dL (ref 2.5–4.6)

## 2017-10-22 LAB — MAGNESIUM: Magnesium: 1.6 mg/dL — ABNORMAL LOW (ref 1.7–2.4)

## 2017-10-22 MED ORDER — INSULIN ASPART 100 UNIT/ML ~~LOC~~ SOLN
60.0000 [IU] | SUBCUTANEOUS | Status: DC
  Administered 2017-10-22: 3.9 [IU] via SUBCUTANEOUS

## 2017-10-22 MED ORDER — INSULIN PUMP
Freq: Three times a day (TID) | SUBCUTANEOUS | Status: DC
Start: 2017-10-22 — End: 2017-10-23
  Administered 2017-10-22 – 2017-10-23 (×3): via SUBCUTANEOUS
  Filled 2017-10-22: qty 1

## 2017-10-22 MED ORDER — SODIUM CHLORIDE 0.9 % IV SOLN
INTRAVENOUS | Status: DC
  Administered 2017-10-22 – 2017-10-23 (×3): via INTRAVENOUS

## 2017-10-22 MED ORDER — INSULIN PUMP
Freq: Three times a day (TID) | SUBCUTANEOUS | Status: DC
  Administered 2017-10-22: 18:00:00 via SUBCUTANEOUS
  Filled 2017-10-22: qty 1

## 2017-10-22 MED ORDER — POTASSIUM CHLORIDE CRYS ER 20 MEQ PO TBCR
40.0000 meq | EXTENDED_RELEASE_TABLET | Freq: Two times a day (BID) | ORAL | Status: AC
  Administered 2017-10-22 (×2): 40 meq via ORAL
  Filled 2017-10-22 (×2): qty 2

## 2017-10-22 MED ORDER — MAGNESIUM SULFATE 2 GM/50ML IV SOLN
2.0000 g | Freq: Once | INTRAVENOUS | Status: AC
  Administered 2017-10-22: 2 g via INTRAVENOUS
  Filled 2017-10-22: qty 50

## 2017-10-22 NOTE — Plan of Care (Signed)
  Progressing Clinical Measurements: Ability to maintain clinical measurements within normal limits will improve 10/22/2017 0149 - Progressing by Ruben Im, RN Diagnostic test results will improve 10/22/2017 0149 - Progressing by Ruben Im, RN Coping: Level of anxiety will decrease 10/22/2017 0149 - Progressing by Ruben Im, RN Pain Managment: General experience of comfort will improve 10/22/2017 0149 - Progressing by Ruben Im, RN

## 2017-10-22 NOTE — Progress Notes (Signed)
[facility] Progess Note    SYNOPSIS   Sergio [name]  is a 42 y.o. male with a known history of hypertriglyceridemia, diabetes mellitus, pancreatitis, hypertension presented to the emergency room with abdominal pain, nausea and vomiting.   patient was noted to have elevated triglyceride level of 4900, admitted for insulin infusion and close monitoring of blood sugar   ASSESSMENT/PLAN   Severe hypertriglyceridemia. Triglyceride decreasing, now 723. Will discuss with hospitalist service about potentially discharging with close outpatient follow-up. Continue on statin and gemfibrozil  Obstructive sleep apnea. Uses nasal CPAP routinely when sleeping  Hypomagnesemia we'll replace  Hypokalemia we'll replace  INTAKE / OUTPUT:  Intake/Output Summary (Last 24 hours) at 10/22/2017 0729 Last data filed at 10/22/2017 0723 Gross per 24 hour  Intake 3704.59 ml  Output 4301 ml  Net -596.41 ml    Name: Sergio [name] MRN: [id number] DOB: [birth year]    ADMISSION DATE:  10/16/2017   SUBJECTIVE:   Patient tolerated his diet yesterday, being advanced to full. Pending triglyceride level this morning and possible discharge  VITAL SIGNS: Temp:  [98.3 F (36.8 C)-98.8 F (37.1 C)] 98.8 F (37.1 C) (01/12 0718) Pulse Rate:  [70-85] 80 (01/11 2000) Resp:  [5-25] 18 (01/11 2000) BP: (112-123)/(73-83) 120/77 (01/12 0718) SpO2:  [89 %-95 %] 95 % (01/12 0032)   PHYSICAL EXAMINATION: Physical Examination:   VS: BP 120/77   Pulse 80   Temp 98.8 F (37.1 C) (Oral)   Resp 18   Ht 5\' 10"  (1.778 m)   Wt 111.9 kg (246 lb 11.1 oz)   SpO2 95%   BMI 35.40 kg/m   General Appearance: No distress  Neuro:without focal findings, mental status normal. HEENT: PERRLA, EOM intact. Pulmonary: normal breath sounds   CardiovascularNormal S1,S2.  No m/r/g.   Abdomen: Epigastric pain with palpation, positive bowel sounds, remainder of the exam is stable Skin:   warm, no rashes, no  ecchymosis  Extremities: normal, no cyanosis, clubbing.    LABORATORY PANEL:   CBC Recent Labs  Lab 10/18/17 0528  WBC 4.7  HGB 13.9  HCT 41.0  PLT 147*    Chemistries  Recent Labs  Lab 10/17/17 0404  10/22/17 0540  NA 138   < > 133*  K 3.2*   < > 3.2*  CL 100*   < > 96*  CO2 27   < > 32  GLUCOSE 68   < > 120*  BUN 9   < > 8  CREATININE 0.65   < > 0.97  CALCIUM 8.4*   < > 8.7*  MG 1.8   < > 1.6*  PHOS  --   --  3.7  AST 24  --   --   ALT 19  --   --   ALKPHOS 44  --   --   BILITOT 0.9  --   --    < > = values in this interval not displayed.    Recent Labs  Lab 10/21/17 2053 10/21/17 2229 10/22/17 0034 10/22/17 0237 10/22/17 0438 10/22/17 0633  GLUCAP 80 137* 139* 118* 132* 126*   No results for input(s): PHART, PCO2ART, PO2ART in the last 168 hours. Recent Labs  Lab 10/16/17 0335 10/17/17 0404  AST 36 24  ALT 26 19  ALKPHOS 48 44  BILITOT 1.6* 0.9  ALBUMIN 4.6 3.9    Cardiac Enzymes No results for input(s): TROPONINI in the last 168 hours.  RADIOLOGY:  No results found.  Hermelinda Dellen, DO  10/22/2017

## 2017-10-22 NOTE — Progress Notes (Signed)
[place] at [place] NAME: Sergio [name]    MR#:  [id number]  DATE OF BIRTH:  [birth year]  SUBJECTIVE:   Continues to complain of abdominal pain and nausea no vomiting  REVIEW OF SYSTEMS:    Review of Systems  Constitutional: Negative for fever, chills weight loss HENT: Negative for ear pain, nosebleeds, congestion, facial swelling, rhinorrhea, neck pain, neck stiffness and ear discharge.   Respiratory: Negative for cough, shortness of breath, wheezing  Cardiovascular: Negative for chest pain, palpitations and leg swelling.  Gastrointestinal: Negative for heartburn, positive abdominal pain, no vomiting, diarrhea or consitpation Genitourinary: Negative for dysuria, urgency, frequency, hematuria Musculoskeletal: Negative for back pain or joint pain Neurological: Negative for dizziness, seizures, syncope, focal weakness,  numbness and headaches.  Hematological: Does not bruise/bleed easily.  Psychiatric/Behavioral: Negative for hallucinations, confusion, dysphoric mood    Tolerating Diet: clear liquid diet     DRUG ALLERGIES:   Allergies  Allergen Reactions  . Codeine Anaphylaxis  . Ivp Dye [Iodinated Diagnostic Agents] Other (See Comments)    Kidneys stop working    VITALS:  Blood pressure 120/77, pulse 80, temperature 98.8 F (37.1 C), temperature source Oral, resp. rate 18, height 5\' 10"  (1.778 m), weight 246 lb 11.1 oz (111.9 kg), SpO2 95 %.  PHYSICAL EXAMINATION:  Constitutional: Appears well-developed and well-nourished. No distress. HENT: Normocephalic. [name] Oropharynx is clear and moist.  Eyes: Conjunctivae and EOM are normal. PERRLA, no scleral icterus.  Neck: Normal ROM. Neck supple. No JVD. No tracheal deviation. CVS: RRR, S1/S2 +, no murmurs, no gallops, no carotid bruit.  Pulmonary: Effort and breath sounds normal, no stridor, rhonchi, wheezes, rales.  Abdominal: Soft. BS +,  no distension, tenderness, rebound or  guarding.  Musculoskeletal: Normal range of motion. No edema and no tenderness.  Neuro: Alert. CN 2-12 grossly intact. No focal deficits. Skin: Skin is warm and dry. No rash noted. Psychiatric: Normal mood and affect.      LABORATORY PANEL:   CBC Recent Labs  Lab 10/18/17 0528  WBC 4.7  HGB 13.9  HCT 41.0  PLT 147*   ------------------------------------------------------------------------------------------------------------------  Chemistries  Recent Labs  Lab 10/17/17 0404  10/22/17 0540  NA 138   < > 133*  K 3.2*   < > 3.2*  CL 100*   < > 96*  CO2 27   < > 32  GLUCOSE 68   < > 120*  BUN 9   < > 8  CREATININE 0.65   < > 0.97  CALCIUM 8.4*   < > 8.7*  MG 1.8   < > 1.6*  AST 24  --   --   ALT 19  --   --   ALKPHOS 44  --   --   BILITOT 0.9  --   --    < > = values in this interval not displayed.   ------------------------------------------------------------------------------------------------------------------  Cardiac Enzymes No results for input(s): TROPONINI in the last 168 hours. ------------------------------------------------------------------------------------------------------------------  RADIOLOGY:  No results found.   ASSESSMENT AND PLAN:   42 year old male with history of diabetes and hypertriglyceridemia who presented with nausea and vomiting and found to have elevated triglyceride level of 4900.  1. Acute pancreatitis due to Hypertriglyceridemia: Patient is treated with D5 with insulin infusion Discontinue insulin drip Continue gemfibrozil and Crestor and omega-3 Advance diet  2. Diabetes: Resume insulin pump at half the dose  3. BPH: Continue Flomax and finasteride  4. OSA: Continue CPAP  5. Depression/anxiety: Continue Zoloft and Xanax  6. Morbid obesity: Weight loss is encouraged  Management plans discussed with the patient and he is in agreement.  CODE STATUS: Full  TOTAL TIME TAKING CARE OF THIS PATIENT: 24 minutes.   Discussed with Dr. Jefferson Fuel   POSSIBLE D/C 2-4 days, DEPENDING ON CLINICAL CONDITION.   Dustin Flock M.D on 10/22/2017 at 4:48 PM  Between 7am to 6pm - Pager - [phone] After 6pm go to [email or web address] - password [email or web address] [facility]  Office  [phone]  CC: Primary care physician; Crecencio Mc, MD  Note: This dictation was prepared with Dragon dictation along with smaller phrase technology. Any transcriptional errors that result from this process are unintentional.

## 2017-10-22 NOTE — Progress Notes (Addendum)
Pt transferred via w/c to room 127.  He called and updated family himself about transfer.  Care endorsed to [facility].

## 2017-10-22 NOTE — Progress Notes (Signed)
Pharmacy Electrolyte Monitoring Consult:  Pharmacy consulted to assist in monitoring and replacing electrolytes in this 42 y.o. male admitted on 10/16/2017 with Abdominal Pain and Emesis Patient is on insulin drip for hypertriglyceridemia.   Labs:  Sodium (mmol/L)  Date Value  10/22/2017 133 (L)  11/04/2014 137   Potassium (mmol/L)  Date Value  10/22/2017 3.2 (L)  11/04/2014 3.6   Magnesium (mg/dL)  Date Value  10/22/2017 1.6 (L)  03/18/2014 1.9   Phosphorus (mg/dL)  Date Value  10/22/2017 3.7   Calcium (mg/dL)  Date Value  10/22/2017 8.7 (L)   Calcium, Total (mg/dL)  Date Value  11/04/2014 8.5   Albumin (g/dL)  Date Value  10/17/2017 3.9  11/01/2014 3.0 (L)    Assessment/Plan: KCl 40 meq po x 2 and mg sulfate 2 g iv once. Will f/u AM labs.   Ulice Dash, PharmD Clinical Pharmacist  10/22/2017 1:52 PM

## 2017-10-23 LAB — BASIC METABOLIC PANEL
ANION GAP: 5 (ref 5–15)
BUN: 13 mg/dL (ref 6–20)
CO2: 31 mmol/L (ref 22–32)
CREATININE: 0.92 mg/dL (ref 0.61–1.24)
Calcium: 8.5 mg/dL — ABNORMAL LOW (ref 8.9–10.3)
Chloride: 100 mmol/L — ABNORMAL LOW (ref 101–111)
GFR calc Af Amer: >60 mL/min (ref >60)
Glucose, Bld: 145 mg/dL — ABNORMAL HIGH (ref 65–99)
Potassium: 3.8 mmol/L (ref 3.5–5.1)
SODIUM: 136 mmol/L (ref 135–145)

## 2017-10-23 LAB — GLUCOSE, CAPILLARY
GLUCOSE-CAPILLARY: 135 mg/dL — AB (ref 65–99)
GLUCOSE-CAPILLARY: 90 mg/dL (ref 65–99)
Glucose-Capillary: 172 mg/dL — ABNORMAL HIGH (ref 65–99)

## 2017-10-23 LAB — MAGNESIUM: MAGNESIUM: 1.9 mg/dL (ref 1.7–2.4)

## 2017-10-23 LAB — TRIGLYCERIDES: TRIGLYCERIDES: 713 mg/dL — AB (ref <150)

## 2017-10-23 MED ORDER — OXYCODONE HCL 10 MG PO TABS: 10.0000 mg | ORAL_TABLET | Freq: Four times a day (QID) | ORAL | 0 refills | Status: DC | PRN

## 2017-10-23 MED ORDER — ONDANSETRON 4 MG PO TBDP: 4.0000 mg | ORAL_TABLET | Freq: Three times a day (TID) | ORAL | 0 refills | Status: DC | PRN

## 2017-10-23 NOTE — Plan of Care (Signed)
VS WDL, free of falls since transfer to unit.  Reported pancreatitis pain 7/10, improved w/ PRN PO Percocet 10-650mg  x1, PRN IV Dilaudid 1mg  x2.  CPAP on while pt sleeping.  Per pt request, Dr. Jannifer Franklin paged re: cardiac monitoring.  Pt states he is sensitive to adhesive in electrodes.  Cardiac monitoring order d/c'ed by Dr. Jannifer Franklin.  No other needs overnight.  Bed in low position, call bell within reach.  WCTM.

## 2017-10-23 NOTE — Discharge Instructions (Signed)
[place] at [place]:  Diabetic diet, low fat diet  DISCHARGE CONDITION:  Stable  ACTIVITY:  Activity as tolerated  OXYGEN:  Home Oxygen: No.   Oxygen Delivery: room air  DISCHARGE LOCATION:  home    ADDITIONAL DISCHARGE INSTRUCTION:   If you experience worsening of your admission symptoms, develop shortness of breath, life threatening emergency, suicidal or homicidal thoughts you must seek medical attention immediately by calling 911 or calling your MD immediately  if symptoms less severe.  You Must read complete instructions/literature along with all the possible adverse reactions/side effects for all the Medicines you take and that have been prescribed to you. Take any new Medicines after you have completely understood and accpet all the possible adverse reactions/side effects.   Please note  You were cared for by a hospitalist during your hospital stay. If you have any questions about your discharge medications or the care you received while you were in the hospital after you are discharged, you can call the unit and asked to speak with the hospitalist on call if the hospitalist that took care of you is not available. Once you are discharged, your primary care physician will handle any further medical issues. Please note that NO REFILLS for any discharge medications will be authorized once you are discharged, as it is imperative that you return to your primary care physician (or establish a relationship with a primary care physician if you do not have one) for your aftercare needs so that they can reassess your need for medications and monitor your lab values.

## 2017-10-23 NOTE — Discharge Summary (Signed)
Sergio [name] at [place], 42 y.o., DOB 03/16/1976, MRN [id number]. Admission date: 10/16/2017 Discharge Date 10/23/2017 Primary MD Crecencio Mc, MD Admitting Physician Saundra Shelling, MD  Admission Diagnosis  Hypertriglyceridemia [E78.1] Acute pancreatitis, unspecified complication status, unspecified pancreatitis type [K85.90]  Discharge Diagnosis   Active Problems:   Acute pancreatitis due to hypertriglyceridemia Hypertriglyceridemia Diabetes type 2 BPH Obstructive sleep apnea Depression anxiety Morbid obesity      Hospital Course  -year-old male with history of diabetes and hypertriglyceridemia who presented with nausea and vomiting and found to have elevated triglyceride level of 4900.  Patient was admitted to the ICU and started on insulin drip.  Insulin drip was discontinued after his triglycerides were lower than 1000.  Patient's normal triglyceride levels run around thousand.  His abdominal pain is improved he is tolerating low-fat diet. Patient is stable for discharge to home              Consults  pulmonary/intensive care  Significant Tests:  See full reports for all details    No results found.     Today   Subjective:   Sergio [name] still has some mild abdominal discomfort  Objective:   Blood pressure 118/71, pulse 66, temperature (!) 97.5 F (36.4 C), temperature source Oral, resp. rate 18, height 5\' 10"  (1.778 m), weight 246 lb 11.1 oz (111.9 kg), SpO2 98 %.  .  Intake/Output Summary (Last 24 hours) at 10/23/2017 1230 Last data filed at 10/23/2017 0907 Gross per 24 hour  Intake 654 ml  Output 3125 ml  Net -2471 ml    Exam VITAL SIGNS: Blood pressure 118/71, pulse 66, temperature (!) 97.5 F (36.4 C), temperature source Oral, resp. rate 18, height 5\' 10"  (1.778 m), weight 246 lb 11.1 oz (111.9 kg), SpO2 98 %.  GENERAL:  41 y.o.-year-old patient lying in the bed with no acute distress.  EYES:  Pupils equal, round, reactive to light and accommodation. No scleral icterus. Extraocular muscles intact.  HEENT: Head atraumatic, normocephalic. Oropharynx and nasopharynx clear.  NECK:  Supple, no jugular venous distention. No thyroid enlargement, no tenderness.  LUNGS: Normal breath sounds bilaterally, no wheezing, rales,rhonchi or crepitation. No use of accessory muscles of respiration.  CARDIOVASCULAR: S1, S2 normal. No murmurs, rubs, or gallops.  ABDOMEN: Soft, nontender, nondistended. Bowel sounds present. No organomegaly or mass.  EXTREMITIES: No pedal edema, cyanosis, or clubbing.  NEUROLOGIC: Cranial nerves II through XII are intact. Muscle strength 5/5 in all extremities. Sensation intact. Gait not checked.  PSYCHIATRIC: The patient is alert and oriented x 3.  SKIN: No obvious rash, lesion, or ulcer.   Data Review     CBC w Diff:  Lab Results  Component Value Date   WBC 4.7 10/18/2017   HGB 13.9 10/18/2017   HGB 12.4 (L) 11/04/2014   HCT 41.0 10/18/2017   HCT 37.9 (L) 11/04/2014   PLT 147 (L) 10/18/2017   PLT 163 11/04/2014   LYMPHOPCT 27 05/13/2017   LYMPHOPCT 44.7 11/04/2014   MONOPCT 6 05/13/2017   MONOPCT 7.0 11/04/2014   EOSPCT 2 05/13/2017   EOSPCT 4.2 11/04/2014   BASOPCT 1 05/13/2017   BASOPCT 0.8 11/04/2014   CMP:  Lab Results  Component Value Date   NA 136 10/23/2017   NA 137 11/04/2014   K 3.8 10/23/2017   K 3.6 11/04/2014   CL 100 (L) 10/23/2017   CL 100 11/04/2014   CO2 31 10/23/2017   CO2 30  11/04/2014   BUN 13 10/23/2017   BUN 7 11/04/2014   CREATININE 0.92 10/23/2017   CREATININE 0.90 11/04/2014   PROT 7.1 10/17/2017   PROT 6.8 11/01/2014   ALBUMIN 3.9 10/17/2017   ALBUMIN 3.0 (L) 11/01/2014   BILITOT 0.9 10/17/2017   BILITOT 0.7 11/01/2014   ALKPHOS 44 10/17/2017   ALKPHOS 47 11/01/2014   AST 24 10/17/2017   AST 53 (H) 11/01/2014   ALT 19 10/17/2017   ALT SEE COMMENT 11/01/2014  .  Micro Results Recent Results (from the past  240 hour(s))  MRSA PCR Screening     Status: None   Collection Time: 10/16/17  7:51 AM  Result Value Ref Range Status   MRSA by PCR NEGATIVE NEGATIVE Final    Comment:        The GeneXpert MRSA Assay (FDA approved for NASAL specimens only), is one component of a comprehensive MRSA colonization surveillance program. It is not intended to diagnose MRSA infection nor to guide or monitor treatment for MRSA infections. Performed at [facility], [address], [place], [place] [address]         Code Status Orders  (From admission, onward)        Start     Ordered   10/16/17 0754  Full code  Continuous     10/16/17 0753    Code Status History    Date Active Date Inactive Code Status Order ID Comments User Context   05/13/2017 15:24 05/23/2017 20:06 Full Code [id number]  [name], MD Inpatient   01/28/2017 08:40 02/03/2017 15:43 Full Code [id number]  Harrie Foreman, MD Inpatient   07/24/2016 22:51 08/04/2016 18:37 Full Code [id number]  Theodoro Grist, MD Inpatient   03/11/2016 04:58 03/19/2016 16:44 Full Code [id number]  Harrie Foreman, MD Inpatient   01/17/2016 02:50 01/27/2016 17:47 Full Code [id number]  Harrie Foreman, MD Inpatient   08/01/2015 23:35 08/15/2015 16:43 Full Code [id number]  Lytle Butte, MD ED   07/21/2014 11:46 07/27/2014 18:16 Full Code [id number]  Oswald Hillock, MD Inpatient          Follow-up Information    Crecencio Mc, MD. Schedule an appointment as soon as possible for a visit in 1 week.   Specialty:  Internal Medicine Contact information: [place] [address] [phone]           Discharge Medications   Allergies as of 10/23/2017      Reactions   Codeine Anaphylaxis   Ivp Dye [iodinated Diagnostic Agents] Other (See Comments)   Kidneys stop working      Medication List    STOP taking these medications   amoxicillin-clavulanate 875-125 MG tablet Commonly known as:  AUGMENTIN      TAKE these medications   ALPRAZolam 0.5 MG tablet Commonly known as:  XANAX Take 1 tablet (0.5 mg total) by mouth at bedtime as needed for sleep.   finasteride 5 MG tablet Commonly known as:  PROSCAR Take 1 tablet (5 mg total) by mouth daily. What changed:  when to take this   gemfibrozil 600 MG tablet Commonly known as:  LOPID Take 1 tablet (600 mg total) by mouth 2 (two) times daily before a meal.   NOVOLOG 100 UNIT/ML injection Generic drug:  insulin aspart Inject 1 Dose into the skin See admin instructions. Use up to 120 units daily via insulin pump   omega-3 acid ethyl esters 1 g capsule Commonly known as:  LOVAZA  Take 2 capsules (2 g total) by mouth 2 (two) times daily. What changed:    how much to take  when to take this   ondansetron 4 MG disintegrating tablet Commonly known as:  ZOFRAN ODT Take 1 tablet (4 mg total) by mouth every 8 (eight) hours as needed for nausea or vomiting.   Oxycodone HCl 10 MG Tabs Take 1 tablet (10 mg total) by mouth every 6 (six) hours as needed. What changed:  Another medication with the same name was added. Make sure you understand how and when to take each.   Oxycodone HCl 10 MG Tabs Take 1 tablet (10 mg total) by mouth every 6 (six) hours as needed. What changed:  You were already taking a medication with the same name, and this prescription was added. Make sure you understand how and when to take each.   Pancrelipase (Lip-Prot-Amyl) 40000-126000 units Cpep Commonly known as:  ZENPEP Take 1 capsule by mouth 5 (five) times daily. Before meals and  snacks What changed:  additional instructions   predniSONE 10 MG tablet Commonly known as:  DELTASONE 6 tablets on Day 1 , then reduce by 1 tablet daily until gone   pregabalin 300 MG capsule Commonly known as:  LYRICA Take 300 mg by mouth at bedtime.   rosuvastatin 10 MG tablet Commonly known as:  CRESTOR Take 1 tablet (10 mg total) by mouth daily at 6 PM. What changed:  when  to take this   sertraline 100 MG tablet Commonly known as:  ZOLOFT Take 1 tablet (100 mg total) by mouth daily.   tamsulosin 0.4 MG Caps capsule Commonly known as:  FLOMAX Take 1 capsule (0.4 mg total) by mouth daily. What changed:  when to take this   traZODone 100 MG tablet Commonly known as:  DESYREL Take 2 tablets (200 mg total) by mouth daily as needed for sleep. What changed:    how much to take  when to take this          Total Time in preparing paper work, data evaluation and todays exam - 35 minutes  Dustin Flock M.D on 10/23/2017 at 12:30 PM  [place]  [phone]

## 2017-10-23 NOTE — Progress Notes (Signed)
Discharge instructions along with home medications and follow up gone over with patient and wife. Both verbalize that they understood instructions. 1 prescriptions given to patient. IV removed. Pt being discharged home on room air, no distress noted. Ammie Dalton, RN

## 2017-10-23 NOTE — Progress Notes (Signed)
Pharmacy Electrolyte Monitoring Consult:  Pharmacy consulted to assist in monitoring and replacing electrolytes in this 42 y.o. male admitted on 10/16/2017 with Abdominal Pain and Emesis Patient was on insulin drip for hypertriglyceridemia- now transitioned to insulin pump  Labs:  Sodium (mmol/L)  Date Value  10/23/2017 136  11/04/2014 137   Potassium (mmol/L)  Date Value  10/23/2017 3.8  11/04/2014 3.6   Magnesium (mg/dL)  Date Value  10/23/2017 1.9  03/18/2014 1.9   Phosphorus (mg/dL)  Date Value  10/22/2017 3.7   Calcium (mg/dL)  Date Value  10/23/2017 8.5 (L)   Calcium, Total (mg/dL)  Date Value  11/04/2014 8.5   Albumin (g/dL)  Date Value  10/17/2017 3.9  11/01/2014 3.0 (L)    Assessment/Plan: 1/12: KCl 40 meq po x 2 and mg sulfate 2 g iv once. Will f/u AM labs.  1/13: K 3.8, Mag 1.9. Now off insulin drip. No supplementation at this time. Will f/u am labs.  Chinita Greenland PharmD Clinical Pharmacist 10/23/2017

## 2017-11-24 ENCOUNTER — Telehealth: Payer: Self-pay | Admitting: Internal Medicine

## 2017-11-24 NOTE — Telephone Encounter (Signed)
Patient called with questions about bill from Dr. Mortimer Fries while in the hospital.  Unable to resolve with patient via phone   Patient given number to [place] office to contact [facility]

## 2017-11-28 ENCOUNTER — Ambulatory Visit: Payer: BLUE CROSS/BLUE SHIELD | Admitting: Internal Medicine

## 2017-11-28 ENCOUNTER — Encounter: Payer: Self-pay | Admitting: Internal Medicine

## 2017-11-28 DIAGNOSIS — F331 Major depressive disorder, recurrent, moderate: Secondary | ICD-10-CM

## 2017-11-28 DIAGNOSIS — G4733 Obstructive sleep apnea (adult) (pediatric): Secondary | ICD-10-CM

## 2017-11-28 DIAGNOSIS — K8689 Other specified diseases of pancreas: Secondary | ICD-10-CM

## 2017-11-28 DIAGNOSIS — J3489 Other specified disorders of nose and nasal sinuses: Secondary | ICD-10-CM

## 2017-11-28 DIAGNOSIS — E781 Pure hyperglyceridemia: Secondary | ICD-10-CM

## 2017-11-28 MED ORDER — MUPIROCIN 2 % EX OINT: 1.0000 "application " | TOPICAL_OINTMENT | Freq: Two times a day (BID) | CUTANEOUS | 3 refills | Status: DC
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
## 2017-11-28 NOTE — Patient Instructions (Addendum)
For your nose:  topical antibiotic mupirocin two times daily with a q tip    Start exercising  Daily ,  In the morning  Make this  Part of your daily schedule to get you out of bed

## 2017-11-29 DIAGNOSIS — J3489 Other specified disorders of nose and nasal sinuses: Secondary | ICD-10-CM | POA: Insufficient documentation

## 2017-11-29 NOTE — Assessment & Plan Note (Signed)
Empiric mupirocin trial bid

## 2017-11-29 NOTE — Assessment & Plan Note (Signed)
Recurrent episodes of uncontrolled levels > 3000 despite use of rosuvastatin and gemfibrozil.  Lab Results  Component Value Date   CHOL 455 (H) 10/17/2017   HDL 18 (L) 10/17/2017   LDLCALC UNABLE TO CALCULATE IF TRIGLYCERIDE OVER 400 mg/dL 10/17/2017   LDLDIRECT 111.4 06/30/2012   TRIG 713 (H) 10/23/2017   CHOLHDL 25.3 10/17/2017

## 2017-11-29 NOTE — Assessment & Plan Note (Signed)
Continue pancrealipase enzyme replacement and insulin pump.

## 2017-11-29 NOTE — Assessment & Plan Note (Signed)
With insomnia , managed with trazodone  , and depression/anxiety managed with zoloft.  No changes today,  Will consider adding wellbutrin if motivation does not improve. STRONGLY encouraged to schedule exercise on days not working.

## 2017-11-29 NOTE — Assessment & Plan Note (Addendum)
Managed with CPAP.  He  Is due for new machine this year. Last sleep study is unavailable,  Will refer to Pulmonology

## 2018-01-15 ENCOUNTER — Emergency Department: Payer: BLUE CROSS/BLUE SHIELD

## 2018-01-15 ENCOUNTER — Other Ambulatory Visit: Payer: Self-pay

## 2018-01-15 ENCOUNTER — Inpatient Hospital Stay
Admission: EM | Admit: 2018-01-15 | Discharge: 2018-01-22 | DRG: 642 | Disposition: A | Discharge status: Home / Self Care | Payer: BLUE CROSS/BLUE SHIELD | Attending: Internal Medicine | Admitting: Internal Medicine

## 2018-01-15 DIAGNOSIS — R402413 Glasgow coma scale score 13-15, at hospital admission: Secondary | ICD-10-CM | POA: Diagnosis present

## 2018-01-15 DIAGNOSIS — N400 Enlarged prostate without lower urinary tract symptoms: Secondary | ICD-10-CM | POA: Diagnosis present

## 2018-01-15 DIAGNOSIS — G4733 Obstructive sleep apnea (adult) (pediatric): Secondary | ICD-10-CM | POA: Diagnosis present

## 2018-01-15 DIAGNOSIS — R112 Nausea with vomiting, unspecified: Secondary | ICD-10-CM

## 2018-01-15 DIAGNOSIS — Z9641 Presence of insulin pump (external) (internal): Secondary | ICD-10-CM | POA: Diagnosis present

## 2018-01-15 DIAGNOSIS — E1165 Type 2 diabetes mellitus with hyperglycemia: Secondary | ICD-10-CM | POA: Diagnosis present

## 2018-01-15 DIAGNOSIS — E781 Pure hyperglyceridemia: Secondary | ICD-10-CM | POA: Diagnosis present

## 2018-01-15 DIAGNOSIS — Z6836 Body mass index (BMI) 36.0-36.9, adult: Secondary | ICD-10-CM

## 2018-01-15 DIAGNOSIS — Z91041 Radiographic dye allergy status: Secondary | ICD-10-CM

## 2018-01-15 DIAGNOSIS — I10 Essential (primary) hypertension: Secondary | ICD-10-CM | POA: Diagnosis present

## 2018-01-15 DIAGNOSIS — K8681 Exocrine pancreatic insufficiency: Secondary | ICD-10-CM | POA: Diagnosis present

## 2018-01-15 DIAGNOSIS — Z885 Allergy status to narcotic agent status: Secondary | ICD-10-CM

## 2018-01-15 DIAGNOSIS — K858 Other acute pancreatitis without necrosis or infection: Secondary | ICD-10-CM | POA: Diagnosis present

## 2018-01-15 DIAGNOSIS — Z8711 Personal history of peptic ulcer disease: Secondary | ICD-10-CM

## 2018-01-15 DIAGNOSIS — K861 Other chronic pancreatitis: Secondary | ICD-10-CM | POA: Diagnosis present

## 2018-01-15 DIAGNOSIS — Z9884 Bariatric surgery status: Secondary | ICD-10-CM | POA: Diagnosis not present

## 2018-01-15 DIAGNOSIS — F419 Anxiety disorder, unspecified: Secondary | ICD-10-CM | POA: Diagnosis present

## 2018-01-15 DIAGNOSIS — M4807 Spinal stenosis, lumbosacral region: Secondary | ICD-10-CM | POA: Diagnosis present

## 2018-01-15 DIAGNOSIS — E876 Hypokalemia: Secondary | ICD-10-CM | POA: Diagnosis present

## 2018-01-15 DIAGNOSIS — F329 Major depressive disorder, single episode, unspecified: Secondary | ICD-10-CM | POA: Diagnosis present

## 2018-01-15 DIAGNOSIS — E1169 Type 2 diabetes mellitus with other specified complication: Secondary | ICD-10-CM | POA: Diagnosis present

## 2018-01-15 DIAGNOSIS — I708 Atherosclerosis of other arteries: Secondary | ICD-10-CM | POA: Diagnosis present

## 2018-01-15 DIAGNOSIS — K760 Fatty (change of) liver, not elsewhere classified: Secondary | ICD-10-CM | POA: Diagnosis present

## 2018-01-15 DIAGNOSIS — R739 Hyperglycemia, unspecified: Secondary | ICD-10-CM

## 2018-01-15 DIAGNOSIS — M48061 Spinal stenosis, lumbar region without neurogenic claudication: Secondary | ICD-10-CM | POA: Diagnosis present

## 2018-01-15 DIAGNOSIS — R162 Hepatomegaly with splenomegaly, not elsewhere classified: Secondary | ICD-10-CM | POA: Diagnosis present

## 2018-01-15 DIAGNOSIS — Z794 Long term (current) use of insulin: Secondary | ICD-10-CM

## 2018-01-15 DIAGNOSIS — R109 Unspecified abdominal pain: Secondary | ICD-10-CM

## 2018-01-15 DIAGNOSIS — K859 Acute pancreatitis without necrosis or infection, unspecified: Secondary | ICD-10-CM | POA: Diagnosis present

## 2018-01-15 DIAGNOSIS — N62 Hypertrophy of breast: Secondary | ICD-10-CM | POA: Diagnosis present

## 2018-01-15 DIAGNOSIS — E871 Hypo-osmolality and hyponatremia: Secondary | ICD-10-CM | POA: Diagnosis present

## 2018-01-15 DIAGNOSIS — Z9989 Dependence on other enabling machines and devices: Secondary | ICD-10-CM

## 2018-01-15 DIAGNOSIS — Z9049 Acquired absence of other specified parts of digestive tract: Secondary | ICD-10-CM

## 2018-01-15 DIAGNOSIS — Z79899 Other long term (current) drug therapy: Secondary | ICD-10-CM

## 2018-01-15 LAB — COMPREHENSIVE METABOLIC PANEL
ALBUMIN: 4.5 g/dL (ref 3.5–5.0)
ALK PHOS: 55 U/L (ref 38–126)
ALT: 26 U/L (ref 17–63)
AST: 32 U/L (ref 15–41)
Anion gap: 13 (ref 5–15)
BILIRUBIN TOTAL: 1.3 mg/dL — AB (ref 0.3–1.2)
BUN: 13 mg/dL (ref 6–20)
CALCIUM: 9.3 mg/dL (ref 8.9–10.3)
CO2: 19 mmol/L — AB (ref 22–32)
Chloride: 103 mmol/L (ref 101–111)
Creatinine, Ser: 0.94 mg/dL (ref 0.61–1.24)
GFR calc Af Amer: >60 mL/min (ref >60)
GFR calc non Af Amer: >60 mL/min (ref >60)
GLUCOSE: 115 mg/dL — AB (ref 65–99)
Potassium: 3.9 mmol/L (ref 3.5–5.1)
SODIUM: 135 mmol/L (ref 135–145)
TOTAL PROTEIN: 7.8 g/dL (ref 6.5–8.1)

## 2018-01-15 LAB — ETHANOL: Alcohol, Ethyl (B): <10 mg/dL (ref <10)

## 2018-01-15 LAB — LIPID PANEL
CHOLESTEROL: 730 mg/dL — AB (ref 0–200)
HDL: 21 mg/dL — ABNORMAL LOW (ref >40)
LDL CALC: UNDETERMINED mg/dL (ref 0–99)
VLDL: UNDETERMINED mg/dL (ref 0–40)

## 2018-01-15 LAB — CBC WITH DIFFERENTIAL/PLATELET
BASOS PCT: 1 %
Basophils Absolute: 0.1 10*3/uL (ref 0–0.1)
EOS PCT: 1 %
Eosinophils Absolute: 0.1 10*3/uL (ref 0–0.7)
HEMATOCRIT: 43.6 % (ref 40.0–52.0)
Hemoglobin: UNDETERMINED g/dL (ref 13.0–18.0)
Lymphocytes Relative: 41 %
Lymphs Abs: 2.1 10*3/uL (ref 1.0–3.6)
MCH: UNDETERMINED pg (ref 26.0–34.0)
MCHC: UNDETERMINED g/dL (ref 32.0–36.0)
MCV: 80.2 fL (ref 80.0–100.0)
MONO ABS: 0.4 10*3/uL (ref 0.2–1.0)
MONOS PCT: 7 %
NEUTROS ABS: 2.7 10*3/uL (ref 1.4–6.5)
Neutrophils Relative %: 50 %
PLATELETS: 209 10*3/uL (ref 150–440)
RBC: 5.44 MIL/uL (ref 4.40–5.90)
RDW: 15.8 % — AB (ref 11.5–14.5)
WBC: 5.3 10*3/uL (ref 3.8–10.6)

## 2018-01-15 LAB — GLUCOSE, CAPILLARY
GLUCOSE-CAPILLARY: 116 mg/dL — AB (ref 65–99)
GLUCOSE-CAPILLARY: 119 mg/dL — AB (ref 65–99)
GLUCOSE-CAPILLARY: 183 mg/dL — AB (ref 65–99)
GLUCOSE-CAPILLARY: 194 mg/dL — AB (ref 65–99)
Glucose-Capillary: 125 mg/dL — ABNORMAL HIGH (ref 65–99)
Glucose-Capillary: 132 mg/dL — ABNORMAL HIGH (ref 65–99)
Glucose-Capillary: 143 mg/dL — ABNORMAL HIGH (ref 65–99)
Glucose-Capillary: 149 mg/dL — ABNORMAL HIGH (ref 65–99)
Glucose-Capillary: 154 mg/dL — ABNORMAL HIGH (ref 65–99)
Glucose-Capillary: 171 mg/dL — ABNORMAL HIGH (ref 65–99)
Glucose-Capillary: 171 mg/dL — ABNORMAL HIGH (ref 65–99)
Glucose-Capillary: 183 mg/dL — ABNORMAL HIGH (ref 65–99)
Glucose-Capillary: 187 mg/dL — ABNORMAL HIGH (ref 65–99)
Glucose-Capillary: 189 mg/dL — ABNORMAL HIGH (ref 65–99)
Glucose-Capillary: 196 mg/dL — ABNORMAL HIGH (ref 65–99)

## 2018-01-15 LAB — MRSA PCR SCREENING: MRSA BY PCR: NEGATIVE

## 2018-01-15 LAB — LIPASE, BLOOD: Lipase: 23 U/L (ref 11–51)

## 2018-01-15 MED ORDER — ONDANSETRON HCL 4 MG/2ML IJ SOLN
INTRAMUSCULAR | Status: AC
  Administered 2018-01-15: 4 mg via INTRAVENOUS
  Filled 2018-01-15: qty 2

## 2018-01-15 MED ORDER — TRAZODONE HCL 100 MG PO TABS
100.0000 mg | ORAL_TABLET | Freq: Every evening | ORAL | Status: DC | PRN
  Administered 2018-01-15 – 2018-01-16 (×2): 100 mg via ORAL
  Administered 2018-01-17 – 2018-01-21 (×3): 200 mg via ORAL
  Filled 2018-01-15: qty 1
  Filled 2018-01-15: qty 2
  Filled 2018-01-15: qty 1
  Filled 2018-01-15 (×2): qty 2

## 2018-01-15 MED ORDER — ONDANSETRON HCL 4 MG/2ML IJ SOLN
4.0000 mg | Freq: Four times a day (QID) | INTRAMUSCULAR | Status: DC | PRN
  Filled 2018-01-15 (×4): qty 2

## 2018-01-15 MED ORDER — HYDROMORPHONE HCL 1 MG/ML IJ SOLN
1.0000 mg | INTRAMUSCULAR | Status: DC | PRN
  Administered 2018-01-15 – 2018-01-17 (×11): 1 mg via INTRAVENOUS
  Filled 2018-01-15 (×11): qty 1

## 2018-01-15 MED ORDER — TAMSULOSIN HCL 0.4 MG PO CAPS
0.4000 mg | ORAL_CAPSULE | Freq: Every day | ORAL | Status: DC
  Administered 2018-01-15 – 2018-01-20 (×6): 0.4 mg via ORAL
  Filled 2018-01-15 (×6): qty 1

## 2018-01-15 MED ORDER — DEXTROSE-NACL 5-0.45 % IV SOLN: INTRAVENOUS | Status: DC

## 2018-01-15 MED ORDER — PREGABALIN 75 MG PO CAPS
300.0000 mg | ORAL_CAPSULE | Freq: Every day | ORAL | Status: DC
  Administered 2018-01-15 – 2018-01-21 (×7): 300 mg via ORAL
  Filled 2018-01-15 (×8): qty 4

## 2018-01-15 MED ORDER — DEXTROSE 10 % IV SOLN
INTRAVENOUS | Status: DC
Start: 2018-01-15 — End: 2018-01-17
  Administered 2018-01-15 – 2018-01-17 (×6): via INTRAVENOUS

## 2018-01-15 MED ORDER — KCL IN DEXTROSE-NACL 20-5-0.45 MEQ/L-%-% IV SOLN
Freq: Once | INTRAVENOUS | Status: AC
  Administered 2018-01-15: 09:00:00 via INTRAVENOUS
  Filled 2018-01-15: qty 1000

## 2018-01-15 MED ORDER — HYDROMORPHONE HCL 1 MG/ML IJ SOLN
1.0000 mg | INTRAMUSCULAR | Status: AC
  Administered 2018-01-15: 1 mg via INTRAVENOUS
  Filled 2018-01-15: qty 1

## 2018-01-15 MED ORDER — ONDANSETRON HCL 4 MG/2ML IJ SOLN
4.0000 mg | Freq: Four times a day (QID) | INTRAMUSCULAR | Status: DC | PRN
Start: 2018-01-15 — End: 2018-01-22
  Administered 2018-01-15 – 2018-01-20 (×5): 4 mg via INTRAVENOUS
  Filled 2018-01-15: qty 2

## 2018-01-15 MED ORDER — GEMFIBROZIL 600 MG PO TABS
600.0000 mg | ORAL_TABLET | Freq: Two times a day (BID) | ORAL | Status: DC
  Administered 2018-01-15 – 2018-01-22 (×14): 600 mg via ORAL
  Filled 2018-01-15 (×15): qty 1

## 2018-01-15 MED ORDER — HYDROMORPHONE HCL 1 MG/ML IJ SOLN
0.5000 mg | INTRAMUSCULAR | Status: AC
  Administered 2018-01-15: 0.5 mg via INTRAVENOUS
  Filled 2018-01-15: qty 1

## 2018-01-15 MED ORDER — HYDROMORPHONE HCL 1 MG/ML IJ SOLN
1.0000 mg | INTRAMUSCULAR | Status: DC | PRN
  Administered 2018-01-15: 1 mg via INTRAVENOUS
  Filled 2018-01-15: qty 1

## 2018-01-15 MED ORDER — ALPRAZOLAM 0.5 MG PO TABS
0.5000 mg | ORAL_TABLET | Freq: Two times a day (BID) | ORAL | Status: DC | PRN
  Administered 2018-01-16 – 2018-01-19 (×3): 0.5 mg via ORAL
  Filled 2018-01-15 (×4): qty 1

## 2018-01-15 MED ORDER — ONDANSETRON HCL 4 MG/2ML IJ SOLN
4.0000 mg | INTRAMUSCULAR | Status: AC
  Administered 2018-01-15: 4 mg via INTRAVENOUS
  Filled 2018-01-15: qty 2

## 2018-01-15 MED ORDER — SODIUM CHLORIDE 0.45 % IV SOLN: INTRAVENOUS | Status: DC

## 2018-01-15 MED ORDER — SERTRALINE HCL 50 MG PO TABS
100.0000 mg | ORAL_TABLET | Freq: Every day | ORAL | Status: DC
  Administered 2018-01-15 – 2018-01-22 (×8): 100 mg via ORAL
  Filled 2018-01-15 (×8): qty 2

## 2018-01-15 MED ORDER — KETOROLAC TROMETHAMINE 30 MG/ML IJ SOLN
30.0000 mg | Freq: Four times a day (QID) | INTRAMUSCULAR | Status: AC | PRN
  Administered 2018-01-16 – 2018-01-17 (×3): 30 mg via INTRAVENOUS
  Filled 2018-01-15 (×3): qty 1

## 2018-01-15 MED ORDER — OMEGA-3-ACID ETHYL ESTERS 1 G PO CAPS
3.0000 g | ORAL_CAPSULE | Freq: Two times a day (BID) | ORAL | Status: DC
  Administered 2018-01-15 – 2018-01-22 (×15): 3 g via ORAL
  Filled 2018-01-15 (×16): qty 3

## 2018-01-15 MED ORDER — ONDANSETRON HCL 4 MG/2ML IJ SOLN
4.0000 mg | Freq: Once | INTRAMUSCULAR | Status: AC | PRN
  Administered 2018-01-15: 4 mg via INTRAVENOUS

## 2018-01-15 MED ORDER — INSULIN REGULAR BOLUS VIA INFUSION
0.0000 [IU] | Freq: Three times a day (TID) | INTRAVENOUS | Status: DC
  Filled 2018-01-15: qty 10

## 2018-01-15 MED ORDER — INSULIN REGULAR HUMAN 100 UNIT/ML IJ SOLN
INTRAMUSCULAR | Status: DC
  Administered 2018-01-15: 0.5 [IU]/h via INTRAVENOUS
  Filled 2018-01-15: qty 1

## 2018-01-15 MED ORDER — ONDANSETRON HCL 4 MG PO TABS: 4.0000 mg | ORAL_TABLET | Freq: Four times a day (QID) | ORAL | Status: DC | PRN

## 2018-01-15 MED ORDER — FINASTERIDE 5 MG PO TABS
5.0000 mg | ORAL_TABLET | Freq: Every day | ORAL | Status: DC
  Administered 2018-01-15 – 2018-01-21 (×7): 5 mg via ORAL
  Filled 2018-01-15 (×7): qty 1

## 2018-01-15 MED ORDER — PANCRELIPASE (LIP-PROT-AMYL) 40000-126000 UNITS PO CPEP: 1.0000 | ORAL_CAPSULE | Freq: Every day | ORAL | Status: DC

## 2018-01-15 MED ORDER — ACETAMINOPHEN 325 MG PO TABS: 650.0000 mg | ORAL_TABLET | Freq: Four times a day (QID) | ORAL | Status: DC | PRN

## 2018-01-15 MED ORDER — SENNOSIDES-DOCUSATE SODIUM 8.6-50 MG PO TABS: 1.0000 | ORAL_TABLET | Freq: Every evening | ORAL | Status: DC | PRN

## 2018-01-15 MED ORDER — SODIUM CHLORIDE 0.9 % IV BOLUS
1000.0000 mL | INTRAVENOUS | Status: AC
  Administered 2018-01-15: 1000 mL via INTRAVENOUS

## 2018-01-15 MED ORDER — DEXTROSE 50 % IV SOLN
25.0000 mL | INTRAVENOUS | Status: DC | PRN
  Administered 2018-01-20: 25 mL via INTRAVENOUS
  Filled 2018-01-15: qty 50

## 2018-01-15 MED ORDER — MORPHINE SULFATE (PF) 4 MG/ML IV SOLN
4.0000 mg | Freq: Once | INTRAVENOUS | Status: AC
  Administered 2018-01-15: 4 mg via INTRAVENOUS

## 2018-01-15 MED ORDER — MORPHINE SULFATE (PF) 4 MG/ML IV SOLN
INTRAVENOUS | Status: AC
  Administered 2018-01-15: 4 mg via INTRAVENOUS
  Filled 2018-01-15: qty 1

## 2018-01-15 MED ORDER — HYDROMORPHONE HCL 1 MG/ML IJ SOLN
0.5000 mg | INTRAMUSCULAR | Status: DC | PRN
  Administered 2018-01-15: 0.5 mg via INTRAVENOUS
  Filled 2018-01-15: qty 1

## 2018-01-15 MED ORDER — SODIUM CHLORIDE 0.9 % IV SOLN
INTRAVENOUS | Status: DC
  Administered 2018-01-15: 0.7 [IU]/h via INTRAVENOUS
  Administered 2018-01-16 (×2): 11 [IU]/h via INTRAVENOUS
  Administered 2018-01-17: 4 [IU]/h via INTRAVENOUS
  Administered 2018-01-17: 8 [IU]/h via INTRAVENOUS
  Administered 2018-01-17: 7 [IU]/h via INTRAVENOUS
  Administered 2018-01-17: 5 [IU]/h via INTRAVENOUS
  Administered 2018-01-18: 1 [IU]/h via INTRAVENOUS
  Filled 2018-01-15 (×5): qty 1

## 2018-01-15 MED ORDER — ACETAMINOPHEN 650 MG RE SUPP: 650.0000 mg | Freq: Four times a day (QID) | RECTAL | Status: DC | PRN

## 2018-01-15 MED ORDER — ENOXAPARIN SODIUM 40 MG/0.4ML ~~LOC~~ SOLN
40.0000 mg | SUBCUTANEOUS | Status: DC
  Administered 2018-01-15 – 2018-01-21 (×7): 40 mg via SUBCUTANEOUS
  Filled 2018-01-15 (×7): qty 0.4

## 2018-01-15 NOTE — ED Notes (Signed)
Patient reports he has had morphine before with no allergic reactions

## 2018-01-15 NOTE — H&P (Signed)
[facility] [place] NAME: Sergio [name]    MR#:  [id number]  DATE OF BIRTH:  01-18-1976  DATE OF ADMISSION:  01/15/2018  PRIMARY CARE PHYSICIAN: Crecencio Mc, MD   REQUESTING/REFERRING PHYSICIAN: Dr. Karma Greaser  CHIEF COMPLAINT:  Abdominal pain   HISTORY OF PRESENT ILLNESS:  Sergio [name]  is a 42 y.o. male with a known history of pancreatitis due to elevated triglyceride levels and diabetes who presents today with 5 days of abdominal pain.  He reports this is consistent with previous admissions for pancreatitis.  Abdominal pain worsens with any oral intake.  Patient has midepigastric abdominal pain radiating to the back with nausea and vomiting.  No relieving factors at this point.  PAST MEDICAL HISTORY:   Past Medical History:  Diagnosis Date  . Anxiety   . Chicken pox   . Depression   . Diabetes mellitus    DM2  . Familial hypertriglyceridemia    severe  . Gastric ulcer 2009  . HTN (hypertension)   . Morbid obesity ([place])    s/p bariatric sleeve surgery 01/2015  . Recurrent acute pancreatitis    secondary to hypertriglyceridemia   . Sleep apnea 1999   uses CPAP    PAST SURGICAL HISTORY:   Past Surgical History:  Procedure Laterality Date  . CHOLECYSTECTOMY    . LAPAROSCOPIC GASTRIC SLEEVE RESECTION    . [place] SURGERY  2008  . TONSILLECTOMY  2003  . VASECTOMY      SOCIAL HISTORY:   Social History   Tobacco Use  . Smoking status: Never Smoker  . Smokeless tobacco: Never Used  Substance Use Topics  . Alcohol use: No    Frequency: Never    FAMILY HISTORY:   Family History  Adopted: Yes  Problem Relation Age of Onset  . Hypertension Mother   . Hyperlipidemia Mother   . Hypertension Father   . Hyperlipidemia Father     DRUG ALLERGIES:   Allergies  Allergen Reactions  . Codeine Anaphylaxis  . Ivp Dye [Iodinated Diagnostic Agents] Other (See Comments)    Kidneys stop working    REVIEW OF  SYSTEMS:   Review of Systems  Constitutional: Negative.  Negative for chills, fever and malaise/fatigue.  HENT: Negative.  Negative for ear discharge, ear pain, hearing loss, nosebleeds and sore throat.   Eyes: Negative.  Negative for blurred vision and pain.  Respiratory: Negative.  Negative for cough, hemoptysis, shortness of breath and wheezing.   Cardiovascular: Negative.  Negative for chest pain, palpitations and leg swelling.  Gastrointestinal: Positive for abdominal pain, nausea and vomiting. Negative for blood in stool and diarrhea.  Genitourinary: Negative.  Negative for dysuria.  Musculoskeletal: Negative.  Negative for back pain.  Skin: Negative.   Neurological: Negative for dizziness, tremors, speech change, focal weakness, seizures and headaches.  Endo/Heme/Allergies: Negative.  Does not bruise/bleed easily.  Psychiatric/Behavioral: Negative.  Negative for depression, hallucinations and suicidal ideas.    MEDICATIONS AT HOME:   Prior to Admission medications   Medication Sig Start Date End Date Taking? Authorizing Provider  ALPRAZolam Duanne Moron) 0.5 MG tablet Take 0.5 mg by mouth 2 (two) times daily as needed for anxiety.    Yes [provider]  finasteride (PROSCAR) 5 MG tablet Take 1 tablet (5 mg total) by mouth daily. Patient taking differently: Take 5 mg by mouth at bedtime.  01/27/16  Yes Vaughan Basta, MD  gemfibrozil (LOPID) 600 MG tablet Take 1 tablet (600  mg total) by mouth 2 (two) times daily before a meal. 05/23/17  Yes Gouru, Aruna, MD  insulin aspart (NOVOLOG) 100 UNIT/ML injection Inject 1 Dose into the skin See admin instructions. Use up to 120 units daily via insulin pump 09/11/15  Yes [provider]  omega-3 acid ethyl esters (LOVAZA) 1 g capsule Take 2 capsules (2 g total) by mouth 2 (two) times daily. Patient taking differently: Take 3 g by mouth 2 (two) times daily.  05/23/17  Yes Gouru, Aruna, MD  ondansetron (ZOFRAN ODT) 4 MG  disintegrating tablet Take 1 tablet (4 mg total) by mouth every 8 (eight) hours as needed for nausea or vomiting. 10/23/17  Yes Dustin Flock, MD  Pancrelipase, Lip-Prot-Amyl, (ZENPEP) 40000-126000 units CPEP Take 1 capsule by mouth 5 (five) times daily. Before meals and  snacks Patient taking differently: Take 1 capsule by mouth 5 (five) times daily. 5 Capsule Before meals and 2 capsules with snacks 06/02/17  Yes Crecencio Mc, MD  pregabalin (LYRICA) 300 MG capsule Take 300 mg by mouth at bedtime.    Yes [provider]  rosuvastatin (CRESTOR) 10 MG tablet Take 1 tablet (10 mg total) by mouth daily at 6 PM. Patient taking differently: Take 10 mg by mouth at bedtime.  05/23/17  Yes Gouru, Illene Silver, MD  sertraline (ZOLOFT) 100 MG tablet Take 1 tablet (100 mg total) by mouth daily. 12/02/16  Yes Crecencio Mc, MD  tamsulosin (FLOMAX) 0.4 MG CAPS capsule Take 1 capsule (0.4 mg total) by mouth daily. Patient taking differently: Take 0.4 mg by mouth at bedtime.  01/27/16  Yes Vaughan Basta, MD  traZODone (DESYREL) 100 MG tablet Take 2 tablets (200 mg total) by mouth daily as needed for sleep. Patient taking differently: Take 100-200 mg by mouth at bedtime as needed for sleep.  05/26/17  Yes Crecencio Mc, MD  amoxicillin-clavulanate (AUGMENTIN) 875-125 MG tablet Take 1 tablet by mouth 2 (two) times daily. 01/05/18 01/18/18  [provider]  mupirocin ointment (BACTROBAN) 2 % Place 1 application into the nose 2 (two) times daily. Patient not taking: Reported on 01/15/2018 11/28/17   Crecencio Mc, MD      VITAL SIGNS:  Blood pressure 136/86, pulse 80, temperature 98.6 F (37 C), temperature source Oral, resp. rate 18, weight 111.6 kg (246 lb), SpO2 98 %.  PHYSICAL EXAMINATION:   Physical Exam  Constitutional: He is oriented to person, place, and time and well-developed, well-nourished, and in no distress. No distress.  HENT:  Head: Normocephalic.  Eyes: No scleral  icterus.  Neck: Normal range of motion. Neck supple. No JVD present. No tracheal deviation present.  Cardiovascular: Normal rate, regular rhythm and normal heart sounds. Exam reveals no gallop and no friction rub.  No murmur heard. Pulmonary/Chest: Effort normal and breath sounds normal. No respiratory distress. He has no wheezes. He has no rales. He exhibits no tenderness.  Abdominal: Soft. Bowel sounds are normal. He exhibits no distension and no mass. There is tenderness. There is no rebound and no guarding.  Musculoskeletal: Normal range of motion. He exhibits no edema.  Neurological: He is alert and oriented to person, place, and time.  Skin: Skin is warm. No rash noted. No erythema.  Psychiatric: Affect and judgment normal.      LABORATORY PANEL:   CBC Recent Labs  Lab 01/15/18 0052  WBC 5.3  HGB UNABLE TO REPORT DUE TO LIPEMIC INTERFERENCE  HCT 43.6  PLT 209   ------------------------------------------------------------------------------------------------------------------  Chemistries  Recent Labs  Lab 01/15/18 0052  NA 135  K 3.9  CL 103  CO2 19*  GLUCOSE 115*  BUN 13  CREATININE 0.94  CALCIUM 9.3  AST 32  ALT 26  ALKPHOS 55  BILITOT 1.3*   ------------------------------------------------------------------------------------------------------------------  Cardiac Enzymes No results for input(s): TROPONINI in the last 168 hours. ------------------------------------------------------------------------------------------------------------------  RADIOLOGY:  No results found.  EKG:  Sinus rhythm no ST elevation or depression  IMPRESSION AND PLAN:   42 year old male with history of keratitis due to hypertriglyceridemia and diabetes on insulin pump presents with abdominal pain.  1.  Acute pancreatitis due to hypertriglyceridemia Patient has had multiple hospitalizations for same issue Patient will be admitted to stepdown unit on insulin drip and D5  half-normal saline Patient is n.p.o. except meds Continue supportive management with IV pain medications and antiemetics Daily triglyceride level Ideally once less than 500 patient may be transitioned to his insulin pump CT shows no pancreatitis??   2.  Diabetes: Continue management as above  3.  Hyperlipidemia: Continue outpatient medications and monitor triglyceride level 4.  Sleep apnea: Continue CPAP  D/w dr Jefferson Fuel  All the records are reviewed and case discussed with ED provider. Management plans discussed with the patient and he is in agreement  CODE STATUS: full  TOTAL TIME TAKING CARE OF THIS PATIENT: 41 minutes.    Tenleigh Byer M.D on 01/15/2018 at 7:47 AM  Between 7am to 6pm - Pager - [phone]  After 6pm go to [email or web address] - password [email or web address] [facility]  Office  [phone]  CC: Primary care physician; Crecencio Mc, MD

## 2018-01-15 NOTE — ED Notes (Signed)
Lipid panel not resulted. Lab notified

## 2018-01-15 NOTE — ED Triage Notes (Signed)
Patient reports hx of pancreatitis, currently c/o upper medial abdominal pain rated at 9 of 10. Patient reports this pain same as previous pancreatitis flares.

## 2018-01-15 NOTE — Consult Note (Signed)
[facility] Consultation    ASSESSMENT/PLAN   Severe hypertriglyceridemia.  Patient with symptoms of pancreatitis however lipase is normal and CT scan of the abdomen did not show any pancreatic inflammation.  Patient is being admitted to the intensive care unit for insulin infusion along with D5 to prevent hypoglycemia with close follow-up his triglyceride levels until significantly improved.  Will hydrate, pain control.  Patient is presently on Lopid and Crestor.  Name: Sergio [name] MRN: [id number] DOB: 02-15-1976    ADMISSION DATE:  01/15/2018 CONSULTATION DATE:  01/15/2018  REFERRING MD :  Dr. Benjie Karvonen  CHIEF COMPLAINT:  Abdominal Pain   HISTORY OF PRESENT ILLNESS:  Sergio [name] is well-known to our service.  He is a 42 y.o. male with a past medical history of diabetes, anxiety/depression, obstructive sleep apnea on nocturnal CPAP, familial hypertriglyceridemia with multiple episodes of pancreatitis requiring admission to the intensive care unit, IV insulin infusion and close follow-up of his triglyceride levels and blood sugar.  He presents with  5 days of persistent and gradually worsening pain similar to prior pancreatitis.  His symptoms are associated with nausea, vomiting, abdominal pain, worsening xanthomas and poor p.o. intake.  His triglycerides are pending, pertinent labs reveal a normal lipase at 23, CO2 of 19, anion gap of 13, glucose of 115.  CT scan of the abdomen was performed findings are as follows; No clear evidence of pancreatitis noted on CT.  Moderate hepatosplenomegaly Diffuse hepatic steatosis without evidence of focal hepatic parenchymal abnormality. Skeletal findings as above, most significantly severe multifactorial spinal stenosis at L3-4 and moderate to severe multifactorial spinal stenosis at L4-5 and L5-S1. Mild RIGHT common iliac artery atherosclerosis, though this is somewhat advanced for patient age. Mild LEFT gynecomastia.  Pending  triglyceride levels but patient is being admitted to the intensive care unit for IV insulin infusion, D5 infusion, pain control.  PAST MEDICAL HISTORY :  Past Medical History:  Diagnosis Date  . Anxiety   . Chicken pox   . Depression   . Diabetes mellitus    DM2  . Familial hypertriglyceridemia    severe  . Gastric ulcer 2009  . HTN (hypertension)   . Morbid obesity ([place])    s/p bariatric sleeve surgery 01/2015  . Recurrent acute pancreatitis    secondary to hypertriglyceridemia   . Sleep apnea 1999   uses CPAP   Past Surgical History:  Procedure Laterality Date  . CHOLECYSTECTOMY    . LAPAROSCOPIC GASTRIC SLEEVE RESECTION    . [place] SURGERY  2008  . TONSILLECTOMY  2003  . VASECTOMY     Prior to Admission medications   Medication Sig Start Date End Date Taking? Authorizing Provider  ALPRAZolam Duanne Moron) 0.5 MG tablet Take 0.5 mg by mouth 2 (two) times daily as needed for anxiety.    Yes [provider]  finasteride (PROSCAR) 5 MG tablet Take 1 tablet (5 mg total) by mouth daily. Patient taking differently: Take 5 mg by mouth at bedtime.  01/27/16  Yes Vaughan Basta, MD  gemfibrozil (LOPID) 600 MG tablet Take 1 tablet (600 mg total) by mouth 2 (two) times daily before a meal. 05/23/17  Yes Gouru, Aruna, MD  insulin aspart (NOVOLOG) 100 UNIT/ML injection Inject 1 Dose into the skin See admin instructions. Use up to 120 units daily via insulin pump 09/11/15  Yes [provider]  omega-3 acid ethyl esters (LOVAZA) 1 g capsule Take 2 capsules (2 g total) by mouth 2 (two)  times daily. Patient taking differently: Take 3 g by mouth 2 (two) times daily.  05/23/17  Yes Gouru, Aruna, MD  ondansetron (ZOFRAN ODT) 4 MG disintegrating tablet Take 1 tablet (4 mg total) by mouth every 8 (eight) hours as needed for nausea or vomiting. 10/23/17  Yes Dustin Flock, MD  Pancrelipase, Lip-Prot-Amyl, (ZENPEP) 40000-126000 units CPEP Take 1 capsule by mouth 5 (five) times  daily. Before meals and  snacks Patient taking differently: Take 1 capsule by mouth 5 (five) times daily. 5 Capsule Before meals and 2 capsules with snacks 06/02/17  Yes Crecencio Mc, MD  pregabalin (LYRICA) 300 MG capsule Take 300 mg by mouth at bedtime.    Yes [provider]  rosuvastatin (CRESTOR) 10 MG tablet Take 1 tablet (10 mg total) by mouth daily at 6 PM. Patient taking differently: Take 10 mg by mouth at bedtime.  05/23/17  Yes Gouru, Illene Silver, MD  sertraline (ZOLOFT) 100 MG tablet Take 1 tablet (100 mg total) by mouth daily. 12/02/16  Yes Crecencio Mc, MD  tamsulosin (FLOMAX) 0.4 MG CAPS capsule Take 1 capsule (0.4 mg total) by mouth daily. Patient taking differently: Take 0.4 mg by mouth at bedtime.  01/27/16  Yes Vaughan Basta, MD  traZODone (DESYREL) 100 MG tablet Take 2 tablets (200 mg total) by mouth daily as needed for sleep. Patient taking differently: Take 100-200 mg by mouth at bedtime as needed for sleep.  05/26/17  Yes Crecencio Mc, MD  amoxicillin-clavulanate (AUGMENTIN) 875-125 MG tablet Take 1 tablet by mouth 2 (two) times daily. 01/05/18 01/18/18  [provider]  mupirocin ointment (BACTROBAN) 2 % Place 1 application into the nose 2 (two) times daily. Patient not taking: Reported on 01/15/2018 11/28/17   Crecencio Mc, MD   Allergies  Allergen Reactions  . Codeine Anaphylaxis  . Ivp Dye [Iodinated Diagnostic Agents] Other (See Comments)    Kidneys stop working    FAMILY HISTORY:  Family History  Adopted: Yes  Problem Relation Age of Onset  . Hypertension Mother   . Hyperlipidemia Mother   . Hypertension Father   . Hyperlipidemia Father    SOCIAL HISTORY:  reports that he has never smoked. He has never used smokeless tobacco. He reports that he does not drink alcohol or use drugs.  REVIEW OF SYSTEMS:   Constitutional: Feels well. Cardiovascular: No chest pain.  Pulmonary: Denies dyspnea.   The remainder of systems were reviewed  and were found to be negative other than what is documented in the HPI.    VITAL SIGNS: Temp:  [98.4 F (36.9 C)-98.6 F (37 C)] 98.4 F (36.9 C) (04/07 0916) Pulse Rate:  [80-83] 81 (04/07 0916) Resp:  [18-21] 21 (04/07 0916) BP: (132-137)/(86-97) 137/97 (04/07 0916) SpO2:  [95 %-98 %] 95 % (04/07 0916) Weight:  [110.9 kg (244 lb 7.8 oz)-111.6 kg (246 lb)] 110.9 kg (244 lb 7.8 oz) (04/07 0916) HEMODYNAMICS:   VENTILATOR SETTINGS:   INTAKE / OUTPUT: No intake or output data in the 24 hours ending 01/15/18 0946  Physical Examination:   VS: BP (!) 137/97   Pulse 81   Temp 98.4 F (36.9 C) (Oral)   Resp (!) 21   Ht 5\' 11"  (1.803 m)   Wt 110.9 kg (244 lb 7.8 oz)   SpO2 95%   BMI 34.10 kg/m   General Appearance: No distress  Neuro:without focal findings, mental status, speech normal,. HEENT: PERRLA, EOM intact, no ptosis, no other lesions noticed;  Pulmonary:  normal breath sounds., diaphragmatic excursion normal. CardiovascularNormal S1,S2.  No m/r/g.    Abdomen: Positive abdominal pain noted on exam, epigastric, positive bowel sounds Renal:  No costovertebral tenderness  Skin:   warm, no rashes, no ecchymosis  Extremities: normal, no cyanosis, clubbing, no edema, warm with normal capillary refill.    LABS: Reviewed   LABORATORY PANEL:   CBC Recent Labs  Lab 01/15/18 0052  WBC 5.3  HGB UNABLE TO REPORT DUE TO LIPEMIC INTERFERENCE  HCT 43.6  PLT 209    Chemistries  Recent Labs  Lab 01/15/18 0052  NA 135  K 3.9  CL 103  CO2 19*  GLUCOSE 115*  BUN 13  CREATININE 0.94  CALCIUM 9.3  AST 32  ALT 26  ALKPHOS 55  BILITOT 1.3*    Recent Labs  Lab 01/15/18 0839 01/15/18 0928  GLUCAP 116* 125*   No results for input(s): PHART, PCO2ART, PO2ART in the last 168 hours. Recent Labs  Lab 01/15/18 0052  AST 32  ALT 26  ALKPHOS 55  BILITOT 1.3*  ALBUMIN 4.5    Cardiac Enzymes No results for input(s): TROPONINI in the last 168 hours.  RADIOLOGY:   Ct Abdomen Pelvis Wo Contrast  Result Date: 01/15/2018 CLINICAL DATA:  43 year old with acute onset of severe UPPER midline abdominal pain which the patient describes as similar to his prior episodes of pancreatitis, though the serum lipase is normal currently. Surgical history includes gastric sleeve bariatric procedure and cholecystectomy. Patient states an allergy to intravenous iodinated contrast. EXAM: CT ABDOMEN AND PELVIS WITHOUT CONTRAST TECHNIQUE: Multidetector CT imaging of the abdomen and pelvis was performed following the standard protocol without IV contrast. COMPARISON:  MRI abdomen 03/21/2014. CT abdomen and pelvis 03/16/2014, 02/15/2013 and earlier. FINDINGS: Lower chest: Stable upper normal heart size. Subpleural fat posteriorly at the RIGHT base. Visualized lung bases clear. Mild LEFT gynecomastia incidentally noted. Hepatobiliary: Mild-to-moderate hepatomegaly, unchanged since the 2015 CT, with diffuse hepatic steatosis. No focal hepatic parenchymal abnormality. Surgically absent gallbladder. No biliary ductal dilation. Pancreas: Normal unenhanced appearance. Specifically, no peripancreatic inflammation as was present on the 2015 CT. Spleen: Enlarged, measuring approximately 11.7 x 7.0 x 17.1 cm yielding a volume of approximately 700 mL, unchanged since the 2015 CT. No focal splenic parenchymal abnormality. Large accessory splenule medial to the LOWER pole of the spleen. Smaller accessory splenule medial to the spleen at the hilum. Adrenals/Urinary Tract: Normal appearing adrenal glands. No evidence of urinary tract calculi or obstruction on either side. Within the limits of the unenhanced technique, no focal parenchymal abnormality involving either kidney. Normal appearing urinary bladder. Stomach/Bowel: Normal-appearing stomach with post surgical changes related to the gastric sleeve procedure. Normal-appearing small bowel. Expected stool burden throughout the normal appearing decompressed  colon. Normal-appearing long appendix in the RIGHT UPPER and mid pelvis. Vascular/Lymphatic: Minimal calcified plaque in the RIGHT common iliac artery. No evidence of atherosclerosis elsewhere. Normal caliber abdominal aorta. No pathologic lymphadenopathy. Reproductive: Prostate gland and seminal vesicles normal in size and appearance for age. Other: None. Musculoskeletal: Degenerative disc disease and spondylosis at L3-4, L4-5 and L5-S1 with calcification or ossification in the POSTERIOR annular fibers at these levels and associated facet degenerative changes. Severe multifactorial spinal stenosis at L3-4 and moderate to severe multifactorial spinal stenosis at L4-5 and L5-S1. IMPRESSION: 1. No evidence of acute pancreatitis. 2. Moderate hepatosplenomegaly, unchanged since the prior CT in 2015. 3. Diffuse hepatic steatosis without evidence of focal hepatic parenchymal abnormality. 4. Skeletal findings as above, most significantly severe  multifactorial spinal stenosis at L3-4 and moderate to severe multifactorial spinal stenosis at L4-5 and L5-S1. 5. Mild RIGHT common iliac artery atherosclerosis, though this is somewhat advanced for patient age. 6. Mild LEFT gynecomastia. Electronically Signed   By: Evangeline Dakin M.D.   On: 01/15/2018 07:52    Hermelinda Dellen, DO  01/15/2018, 9:46 AM

## 2018-01-15 NOTE — ED Notes (Signed)
ICU to call to receive report per Joellen Jersey RN ICU charge RN

## 2018-01-15 NOTE — ED Notes (Signed)
ED Provider at bedside. 

## 2018-01-15 NOTE — ED Provider Notes (Signed)
[facility] Emergency Department Provider Note  ____________________________________________   First MD Initiated Contact with Patient 01/15/18 0600     (approximate)  I have reviewed the triage vital signs and the nursing notes.   HISTORY  Chief Complaint Abdominal Pain    HPI Sergio [name] is a 42 y.o. male with a history of very severe and chronic hyperlipidemia that has led to multiple episodes of pancreatitis with normal lipase levels.  He presents today with about 5 days of persistent and gradually worsening pain similar to prior pancreatitis.  He states that he gets xanthomas on his skin when his triglycerides are particularly elevated and that has been happening recently.  He saw his endocrinologist within the last several days and warned her that he was starting to have pain and she told him to continue taking his medication and monitor his symptoms.  He has had very minimal oral intake over the last few days to try to help his symptoms but it is not working.  He has an insulin pump and continuous glucose monitor and his readings have been reassuring.  He says that eating or drinking anything makes his symptoms worse and nothing is making it better.  He has had some nausea and vomiting, mostly persistent nausea.  Denies both constipation and diarrhea.  He has no dysuria.  He denies fever/chills, chest pain, shortness of breath.  The pain is primarily in his epigastrium, is both sharp and aching, and is severe.  Past Medical History:  Diagnosis Date  . Anxiety   . Chicken pox   . Depression   . Diabetes mellitus    DM2  . Familial hypertriglyceridemia    severe  . Gastric ulcer 2009  . HTN (hypertension)   . Morbid obesity ([place])    s/p bariatric sleeve surgery 01/2015  . Recurrent acute pancreatitis    secondary to hypertriglyceridemia   . Sleep apnea 1999   uses CPAP    Patient Active Problem List   Diagnosis Date Noted  . Acute pancreatitis  01/15/2018  . Nasal sore 11/29/2017  . Hospital discharge follow-up 08/10/2016  . Sixth nerve palsy of left eye   . Pancreatic insufficiency 07/24/2016  . Hyponatremia 07/24/2016  . Encounter for preventive health examination 05/01/2016  . Depression, major, recurrent, moderate ([place]) 03/14/2016  . S/P bariatric surgery 02/01/2015  . S/P laparoscopic cholecystectomy 02/01/2015  . Diarrhea 05/29/2012  . Recurrent pancreatitis 05/29/2012  . Generalized anxiety disorder 04/29/2012  . Obesity (BMI 30-39.9) 04/29/2012  . DM (diabetes mellitus), type 2, uncontrolled w/neurologic complication ([place])   . Hypertriglyceridemia   . HTN (hypertension)   . Sleep apnea     Past Surgical History:  Procedure Laterality Date  . CHOLECYSTECTOMY    . LAPAROSCOPIC GASTRIC SLEEVE RESECTION    . [place] SURGERY  2008  . TONSILLECTOMY  2003  . VASECTOMY      Prior to Admission medications   Medication Sig Start Date End Date Taking? Authorizing Provider  ALPRAZolam Duanne Moron) 0.5 MG tablet Take 0.5 mg by mouth 2 (two) times daily as needed for anxiety.    Yes [provider]  finasteride (PROSCAR) 5 MG tablet Take 1 tablet (5 mg total) by mouth daily. Patient taking differently: Take 5 mg by mouth at bedtime.  01/27/16  Yes Vaughan Basta, MD  gemfibrozil (LOPID) 600 MG tablet Take 1 tablet (600 mg total) by mouth 2 (two) times daily before a meal. 05/23/17  Yes Gouru,  Illene Silver, MD  insulin aspart (NOVOLOG) 100 UNIT/ML injection Inject 1 Dose into the skin See admin instructions. Use up to 120 units daily via insulin pump 09/11/15  Yes [provider]  omega-3 acid ethyl esters (LOVAZA) 1 g capsule Take 2 capsules (2 g total) by mouth 2 (two) times daily. Patient taking differently: Take 3 g by mouth 2 (two) times daily.  05/23/17  Yes Gouru, Aruna, MD  ondansetron (ZOFRAN ODT) 4 MG disintegrating tablet Take 1 tablet (4 mg total) by mouth every 8 (eight) hours as needed for nausea  or vomiting. 10/23/17  Yes Dustin Flock, MD  Pancrelipase, Lip-Prot-Amyl, (ZENPEP) 40000-126000 units CPEP Take 1 capsule by mouth 5 (five) times daily. Before meals and  snacks Patient taking differently: Take 1 capsule by mouth 5 (five) times daily. 5 Capsule Before meals and 2 capsules with snacks 06/02/17  Yes Crecencio Mc, MD  pregabalin (LYRICA) 300 MG capsule Take 300 mg by mouth at bedtime.    Yes [provider]  rosuvastatin (CRESTOR) 10 MG tablet Take 1 tablet (10 mg total) by mouth daily at 6 PM. Patient taking differently: Take 10 mg by mouth at bedtime.  05/23/17  Yes Gouru, Illene Silver, MD  sertraline (ZOLOFT) 100 MG tablet Take 1 tablet (100 mg total) by mouth daily. 12/02/16  Yes Crecencio Mc, MD  tamsulosin (FLOMAX) 0.4 MG CAPS capsule Take 1 capsule (0.4 mg total) by mouth daily. Patient taking differently: Take 0.4 mg by mouth at bedtime.  01/27/16  Yes Vaughan Basta, MD  traZODone (DESYREL) 100 MG tablet Take 2 tablets (200 mg total) by mouth daily as needed for sleep. Patient taking differently: Take 100-200 mg by mouth at bedtime as needed for sleep.  05/26/17  Yes Crecencio Mc, MD  amoxicillin-clavulanate (AUGMENTIN) 875-125 MG tablet Take 1 tablet by mouth 2 (two) times daily. 01/05/18 01/18/18  [provider]  mupirocin ointment (BACTROBAN) 2 % Place 1 application into the nose 2 (two) times daily. Patient not taking: Reported on 01/15/2018 11/28/17   Crecencio Mc, MD    Allergies Codeine and Ivp dye [iodinated diagnostic agents]  Family History  Adopted: Yes  Problem Relation Age of Onset  . Hypertension Mother   . Hyperlipidemia Mother   . Hypertension Father   . Hyperlipidemia Father     Social History Social History   Tobacco Use  . Smoking status: Never Smoker  . Smokeless tobacco: Never Used  Substance Use Topics  . Alcohol use: No    Frequency: Never  . Drug use: No    Review of Systems Constitutional: No  fever/chills Eyes: No visual changes. ENT: No sore throat. Cardiovascular: Denies chest pain. Respiratory: Denies shortness of breath. Gastrointestinal: Abdominal pain as described above.  Some nausea and vomiting. genitourinary: Negative for dysuria. Musculoskeletal: Negative for neck pain.  Negative for back pain. Integumentary: Negative for rash. Neurological: Negative for headaches, focal weakness or numbness.   ____________________________________________   PHYSICAL EXAM:  VITAL SIGNS: ED Triage Vitals  Enc Vitals Group     BP 01/15/18 0043 (!) 132/92     Pulse Rate 01/15/18 0043 83     Resp 01/15/18 0043 18     Temp 01/15/18 0043 98.6 F (37 C)     Temp Source 01/15/18 0043 Oral     SpO2 01/15/18 0043 97 %     Weight 01/15/18 0041 111.6 kg (246 lb)     Height --      Head  Circumference --      Peak Flow --      Pain Score 01/15/18 0041 9     Pain Loc --      Pain Edu? --      Excl. in [place]? --     Constitutional: Alert and oriented.  Generally well-appearing but does appear uncomfortable. Eyes: Conjunctivae are normal.  Head: Atraumatic. Nose: No congestion/rhinnorhea. Mouth/Throat: Mucous membranes are moist. Neck: No stridor.  No meningeal signs.   Cardiovascular: Normal rate, regular rhythm. Good peripheral circulation. Grossly normal heart sounds. Respiratory: Normal respiratory effort.  No retractions. Lungs CTAB. Gastrointestinal: Soft with severe epigastric tenderness with some rebound, no lower abdominal tenderness. Musculoskeletal: No lower extremity tenderness nor edema. No gross deformities of extremities. Neurologic:  Normal speech and language. No gross focal neurologic deficits are appreciated.  Skin:  Skin is warm, dry and intact. No rash noted. Psychiatric: Mood and affect are normal. Speech and behavior are normal.  ____________________________________________   LABS (all labs ordered are listed, but only abnormal results are  displayed)  Labs Reviewed  COMPREHENSIVE METABOLIC PANEL - Abnormal; Notable for the following components:      Result Value   CO2 19 (*)    Glucose, Bld 115 (*)    Total Bilirubin 1.3 (*)    All other components within normal limits  CBC WITH DIFFERENTIAL/PLATELET - Abnormal; Notable for the following components:   RDW 15.8 (*)    All other components within normal limits  LIPASE, BLOOD  ETHANOL  URINALYSIS, COMPLETE (UACMP) WITH MICROSCOPIC  LIPID PANEL   ____________________________________________  EKG  ED ECG REPORT I, Hinda Kehr, the attending physician, personally viewed and interpreted this ECG.  Date: 01/15/2018 EKG Time: 00:36 Rate: 86 Rhythm: normal sinus rhythm QRS Axis: normal Intervals: normal ST/T Wave abnormalities: normal Narrative Interpretation: no evidence of acute ischemia  ____________________________________________  RADIOLOGY   ED MD interpretation:  CT scan pending  Official radiology report(s): No results found.  ____________________________________________   PROCEDURES  Critical Care performed: No   Procedure(s) performed:   Procedures   ____________________________________________   INITIAL IMPRESSION / ASSESSMENT AND PLAN / ED COURSE  As part of my medical decision making, I reviewed the following data within the [place] notes reviewed and incorporated, Labs reviewed , Old chart reviewed and Notes from prior ED visits    Differential diagnosis includes, but is not limited to, recurrent pancreatitis secondary to hyperlipidemia, appendicitis, bowel obstruction/ileus, etc.  I reviewed the medical record and saw that he was last admitted about 3 months ago for very similar symptoms.  His lipid panel showed a triglyceride level of about 5000.  He was admitted to the ICU for insulin drip and D5 half-normal saline until his triglycerides came down to around 1000.  His symptoms are very similar this  time and this is happened to him multiple times in the past even with a normal lipase.  I am giving him 1 L of IV fluids and the morphine did not work so I am giving Dilaudid 1 mg IV and another dose of Zofran 4 mg IV.  He has not had any imaging of his abdomen since 2015 so I am getting a noncontrast CT abdomen pelvis due to reported allergy to IV contrast and an inability to tolerate oral contrast.  I have added on a lipid panel and anticipate he will need admission similar to last time.  He agrees with the plan.  Clinical Course as  of Jan 16 751  Nancy Fetter Jan 15, 2018  0704 Labs are notable for essentially normal metabolic panel, lipase within normal limits, and normal ethanol level.  Lipid panel is pending.  CBC is notable for some results such as hemoglobin that are unable to be reported due to lipemic interference, further clarify my suspicion of pancreatitis secondary to hyperlipidemia.   [CF]  2035 Lipid panel is still pending but I reviewed his records extensively and saw that he required multiple days in the ICU for D5 half-normal infusion along with insulin infusion to bring down his triglycerides to around the thousand.  He needs admission for intractable pain, intractable nausea and vomiting, as well as hypertriglyceridemia induced pancreatitis.  CT scan results are pending.  I spoke by phone with Dr. Benjie Karvonen with the hospitalist service who will admit.  I also spoke with Dr. Jefferson Fuel with the intensivist service who remembered the patient from his last admission and confirmed the recommended plan of D5 half-normal with insulin drip.  I have dated the patient who understands and agrees with the plan.  His pain is adequately controlled at this time.   [CF]    Clinical Course User Index [CF] Hinda Kehr, MD    ____________________________________________  FINAL CLINICAL IMPRESSION(S) / ED DIAGNOSES  Final diagnoses:  Other acute pancreatitis without infection or necrosis   Hypertriglyceridemia  Intractable abdominal pain  Intractable vomiting with nausea, unspecified vomiting type     MEDICATIONS GIVEN DURING THIS VISIT:  Medications  dextrose 5 % and 0.45 % NaCl with KCl 20 mEq/L infusion (has no administration in time range)  insulin regular (NOVOLIN R,HUMULIN R) 100 Units in sodium chloride 0.9 % 100 mL (1 Units/mL) infusion (has no administration in time range)  ondansetron (ZOFRAN) injection 4 mg (4 mg Intravenous Given 01/15/18 0053)  morphine 4 MG/ML injection 4 mg (4 mg Intravenous Given 01/15/18 0054)  HYDROmorphone (DILAUDID) injection 1 mg (1 mg Intravenous Given 01/15/18 0623)  ondansetron (ZOFRAN) injection 4 mg (4 mg Intravenous Given 01/15/18 0623)  sodium chloride 0.9 % bolus 1,000 mL (1,000 mLs Intravenous New Bag/Given 01/15/18 5974)     ED Discharge Orders    None       Note:  This document was prepared using Dragon voice recognition software and may include unintentional dictation errors.    Hinda Kehr, MD 01/15/18 [id number]

## 2018-01-16 LAB — GLUCOSE, CAPILLARY
GLUCOSE-CAPILLARY: 151 mg/dL — AB (ref 65–99)
GLUCOSE-CAPILLARY: 138 mg/dL — AB (ref 65–99)
GLUCOSE-CAPILLARY: 121 mg/dL — AB (ref 65–99)
GLUCOSE-CAPILLARY: 128 mg/dL — AB (ref 65–99)
GLUCOSE-CAPILLARY: 153 mg/dL — AB (ref 65–99)
GLUCOSE-CAPILLARY: 132 mg/dL — AB (ref 65–99)
GLUCOSE-CAPILLARY: 122 mg/dL — AB (ref 65–99)
GLUCOSE-CAPILLARY: 95 mg/dL (ref 65–99)
GLUCOSE-CAPILLARY: 103 mg/dL — AB (ref 65–99)
Glucose-Capillary: 100 mg/dL — ABNORMAL HIGH (ref 65–99)
Glucose-Capillary: 101 mg/dL — ABNORMAL HIGH (ref 65–99)
Glucose-Capillary: 111 mg/dL — ABNORMAL HIGH (ref 65–99)
Glucose-Capillary: 113 mg/dL — ABNORMAL HIGH (ref 65–99)
Glucose-Capillary: 115 mg/dL — ABNORMAL HIGH (ref 65–99)
Glucose-Capillary: 116 mg/dL — ABNORMAL HIGH (ref 65–99)
Glucose-Capillary: 119 mg/dL — ABNORMAL HIGH (ref 65–99)
Glucose-Capillary: 119 mg/dL — ABNORMAL HIGH (ref 65–99)
Glucose-Capillary: 120 mg/dL — ABNORMAL HIGH (ref 65–99)
Glucose-Capillary: 120 mg/dL — ABNORMAL HIGH (ref 65–99)
Glucose-Capillary: 128 mg/dL — ABNORMAL HIGH (ref 65–99)
Glucose-Capillary: 132 mg/dL — ABNORMAL HIGH (ref 65–99)

## 2018-01-16 LAB — COMPREHENSIVE METABOLIC PANEL
ALBUMIN: 4.2 g/dL (ref 3.5–5.0)
ALT: 28 U/L (ref 17–63)
ANION GAP: 13 (ref 5–15)
AST: 31 U/L (ref 15–41)
Alkaline Phosphatase: 48 U/L (ref 38–126)
BUN: 11 mg/dL (ref 6–20)
CO2: 21 mmol/L — AB (ref 22–32)
Calcium: 8.4 mg/dL — ABNORMAL LOW (ref 8.9–10.3)
Chloride: 97 mmol/L — ABNORMAL LOW (ref 101–111)
Creatinine, Ser: 0.81 mg/dL (ref 0.61–1.24)
GFR calc non Af Amer: >60 mL/min (ref >60)
GLUCOSE: 120 mg/dL — AB (ref 65–99)
POTASSIUM: 3.5 mmol/L (ref 3.5–5.1)
SODIUM: 131 mmol/L — AB (ref 135–145)
Total Bilirubin: 1.4 mg/dL — ABNORMAL HIGH (ref 0.3–1.2)
Total Protein: 7.3 g/dL (ref 6.5–8.1)

## 2018-01-16 LAB — CBC
HEMATOCRIT: 42.1 % (ref 40.0–52.0)
HEMOGLOBIN: 14.9 g/dL (ref 13.0–18.0)
MCH: 28.5 pg (ref 26.0–34.0)
MCHC: 35.4 g/dL (ref 32.0–36.0)
MCV: 80.6 fL (ref 80.0–100.0)
Platelets: 164 10*3/uL (ref 150–440)
RBC: 5.22 MIL/uL (ref 4.40–5.90)
RDW: 15.5 % — ABNORMAL HIGH (ref 11.5–14.5)
WBC: 3.8 10*3/uL (ref 3.8–10.6)

## 2018-01-16 LAB — LIPID PANEL
Cholesterol: 630 mg/dL — ABNORMAL HIGH (ref 0–200)
LDL CALC: UNDETERMINED mg/dL (ref 0–99)
TRIGLYCERIDES: 3696 mg/dL — AB (ref <150)
VLDL: UNDETERMINED mg/dL (ref 0–40)

## 2018-01-16 LAB — TRIGLYCERIDES: TRIGLYCERIDES: 3173 mg/dL — AB (ref <150)

## 2018-01-16 MED ORDER — POTASSIUM CHLORIDE 20 MEQ PO PACK
40.0000 meq | PACK | Freq: Once | ORAL | Status: AC
  Administered 2018-01-16: 40 meq via ORAL
  Filled 2018-01-16: qty 2

## 2018-01-16 MED ORDER — HYDROMORPHONE HCL 1 MG/ML IJ SOLN: 0.5000 mg | INTRAMUSCULAR | Status: DC | PRN

## 2018-01-16 NOTE — Progress Notes (Signed)
Pt on max dose of D10, 289ml/hr. Pt not at BG range and insulin at 11units/hr without titration. Md paged to notify. No return call at this time.

## 2018-01-16 NOTE — Progress Notes (Signed)
[facility] [place] [facility] [place] NAME: Sergio [name]    MR#:  [id number]  DATE OF BIRTH:  1976/05/02  SUBJECTIVE:  CHIEF COMPLAINT:   Chief Complaint  Patient presents with  . Abdominal Pain   - multiple admission for hypertriglyceridemia causing pancreatitis-Remains on insulin drip and dextrose. -Still has significant abdominal pain  REVIEW OF SYSTEMS:  Review of Systems  Constitutional: Negative for chills and fever.  HENT: Negative for congestion, ear discharge, hearing loss and nosebleeds.   Eyes: Negative for blurred vision and double vision.  Respiratory: Negative for cough, shortness of breath and wheezing.   Cardiovascular: Negative for chest pain and palpitations.  Gastrointestinal: Positive for abdominal pain and vomiting. Negative for constipation, diarrhea and nausea.  Genitourinary: Negative for dysuria.  Neurological: Negative for dizziness, seizures and headaches.    DRUG ALLERGIES:   Allergies  Allergen Reactions  . Codeine Anaphylaxis  . Ivp Dye [Iodinated Diagnostic Agents] Other (See Comments)    Kidneys stop working    VITALS:  Blood pressure 103/65, pulse 77, temperature 98.7 F (37.1 C), temperature source Oral, resp. rate (!) 9, height 5\' 11"  (1.803 m), weight 110.9 kg (244 lb 7.8 oz), SpO2 95 %.  PHYSICAL EXAMINATION:  Physical Exam  GENERAL:  42 y.o.-year-old patient lying in the bed with no acute distress.  EYES: Pupils equal, round, reactive to light and accommodation. No scleral icterus. Extraocular muscles intact.  HEENT: Head atraumatic, normocephalic. Oropharynx and nasopharynx clear.  NECK:  Supple, no jugular venous distention. No thyroid enlargement, no tenderness.  LUNGS: Normal breath sounds bilaterally, no wheezing, rales,rhonchi or crepitation. No use of accessory muscles of respiration.  CARDIOVASCULAR: S1, S2 normal. No murmurs, rubs, or gallops.  ABDOMEN: Soft, tender in epigastric and mid  abdomen, nondistended. Bowel sounds present. No organomegaly or mass.  EXTREMITIES: No pedal edema, cyanosis, or clubbing.  NEUROLOGIC: Cranial nerves II through XII are intact. Muscle strength 5/5 in all extremities. Sensation intact. Gait not checked.  PSYCHIATRIC: The patient is alert and oriented x 3.  SKIN: No obvious rash, lesion, or ulcer.    LABORATORY PANEL:   CBC Recent Labs  Lab 01/16/18 0316  WBC 3.8  HGB 14.9  HCT 42.1  PLT 164   ------------------------------------------------------------------------------------------------------------------  Chemistries  Recent Labs  Lab 01/16/18 0316  NA 131*  K 3.5  CL 97*  CO2 21*  GLUCOSE 120*  BUN 11  CREATININE 0.81  CALCIUM 8.4*  AST 31  ALT 28  ALKPHOS 48  BILITOT 1.4*   ------------------------------------------------------------------------------------------------------------------  Cardiac Enzymes No results for input(s): TROPONINI in the last 168 hours. ------------------------------------------------------------------------------------------------------------------  RADIOLOGY:  Ct Abdomen Pelvis Wo Contrast  Result Date: 01/15/2018 CLINICAL DATA:  42 year old with acute onset of severe UPPER midline abdominal pain which the patient describes as similar to his prior episodes of pancreatitis, though the serum lipase is normal currently. Surgical history includes gastric sleeve bariatric procedure and cholecystectomy. Patient states an allergy to intravenous iodinated contrast. EXAM: CT ABDOMEN AND PELVIS WITHOUT CONTRAST TECHNIQUE: Multidetector CT imaging of the abdomen and pelvis was performed following the standard protocol without IV contrast. COMPARISON:  MRI abdomen 03/21/2014. CT abdomen and pelvis 03/16/2014, 02/15/2013 and earlier. FINDINGS: Lower chest: Stable upper normal heart size. Subpleural fat posteriorly at the RIGHT base. Visualized lung bases clear. Mild LEFT gynecomastia incidentally noted.  Hepatobiliary: Mild-to-moderate hepatomegaly, unchanged since the 2015 CT, with diffuse hepatic steatosis. No focal hepatic parenchymal abnormality. Surgically absent gallbladder. No biliary  ductal dilation. Pancreas: Normal unenhanced appearance. Specifically, no peripancreatic inflammation as was present on the 2015 CT. Spleen: Enlarged, measuring approximately 11.7 x 7.0 x 17.1 cm yielding a volume of approximately 700 mL, unchanged since the 2015 CT. No focal splenic parenchymal abnormality. Large accessory splenule medial to the LOWER pole of the spleen. Smaller accessory splenule medial to the spleen at the hilum. Adrenals/Urinary Tract: Normal appearing adrenal glands. No evidence of urinary tract calculi or obstruction on either side. Within the limits of the unenhanced technique, no focal parenchymal abnormality involving either kidney. Normal appearing urinary bladder. Stomach/Bowel: Normal-appearing stomach with post surgical changes related to the gastric sleeve procedure. Normal-appearing small bowel. Expected stool burden throughout the normal appearing decompressed colon. Normal-appearing long appendix in the RIGHT UPPER and mid pelvis. Vascular/Lymphatic: Minimal calcified plaque in the RIGHT common iliac artery. No evidence of atherosclerosis elsewhere. Normal caliber abdominal aorta. No pathologic lymphadenopathy. Reproductive: Prostate gland and seminal vesicles normal in size and appearance for age. Other: None. Musculoskeletal: Degenerative disc disease and spondylosis at L3-4, L4-5 and L5-S1 with calcification or ossification in the POSTERIOR annular fibers at these levels and associated facet degenerative changes. Severe multifactorial spinal stenosis at L3-4 and moderate to severe multifactorial spinal stenosis at L4-5 and L5-S1. IMPRESSION: 1. No evidence of acute pancreatitis. 2. Moderate hepatosplenomegaly, unchanged since the prior CT in 2015. 3. Diffuse hepatic steatosis without evidence  of focal hepatic parenchymal abnormality. 4. Skeletal findings as above, most significantly severe multifactorial spinal stenosis at L3-4 and moderate to severe multifactorial spinal stenosis at L4-5 and L5-S1. 5. Mild RIGHT common iliac artery atherosclerosis, though this is somewhat advanced for patient age. 6. Mild LEFT gynecomastia. Electronically Signed   By: Evangeline Dakin M.D.   On: 01/15/2018 07:52    EKG:   Orders placed or performed during the hospital encounter of 01/15/18  . EKG 12-Lead  . EKG 12-Lead  . ED EKG  . ED EKG  . EKG    ASSESSMENT AND PLAN:    42 year old male with known history of familial hypertriglyceridemia, hypertension, diabetes mellitus, recurrent admissions for pancreatitis presents again with abdominal pain  1. Acute on chronic pancreatitis-triggered by hypertriglyceridemia -Continue IV insulin drip and dextrose until triglycerides are less than thousand -remains nothing by mouth -IV fluids -supportive management  2. Diabetes mellitus- currently on insulin drip Follows with endocrinologist as outpatient -Remains nothing by mouth. Insulin pump is turned off   3. Hypokalemia-replaced  4.Hypertriglyceridemia-continue gemfibrozil, recommend adding statin  5. Depression and anxiety-continue home medications  6. DVT prophylaxis-Lovenox   All the records are reviewed and case discussed with Care Management/Social Workerr. Management plans discussed with the patient, family and they are in agreement.  CODE STATUS: Full Code  TOTAL TIME TAKING CARE OF THIS PATIENT: 38 minutes.   POSSIBLE D/C IN 2 DAYS, DEPENDING ON CLINICAL CONDITION.   Gladstone Lighter M.D on 01/16/2018 at 3:36 PM  Between 7am to 6pm - Pager - [phone]  After 6pm go to [email or web address] - password [email or web address] [facility]  Office  [phone]  CC: Primary care physician; Crecencio Mc, MD

## 2018-01-16 NOTE — Progress Notes (Signed)
eLink Physician-Brief Progress Note Patient Name: Sergio [name] DOB: 04-Feb-1976 MRN: [id number]   Date of Service  01/16/2018  HPI/Events of Note  Patient currently on D10W at 240 mL/hour and an insulin IV infusion at 11 units/hour to clear high triglyceride level. Blood glucose = 120.  eICU Interventions  Will order: 1. Decrease insulin IV infusion to 8 units/hour.      Intervention Category Major Interventions: Other:  [name] 01/16/2018, 11:08 PM

## 2018-01-16 NOTE — Progress Notes (Signed)
Inpatient Diabetes Program Recommendations  AACE/ADA: New Consensus Statement on Inpatient Glycemic Control (2015)  Target Ranges:  Prepandial:   less than 140 mg/dL      Peak postprandial:   less than 180 mg/dL (1-2 hours)      Critically ill patients:  140 - 180 mg/dL   Results for DURON, MEISTER (MRN [id number]) as of 01/16/2018 10:50  Ref. Range 01/15/2018 23:43 01/16/2018 01:12 01/16/2018 02:16 01/16/2018 03:06 01/16/2018 04:12 01/16/2018 05:14 01/16/2018 06:22 01/16/2018 07:45 01/16/2018 09:20 01/16/2018 10:34  Glucose-Capillary Latest Ref Range: 65 - 99 mg/dL 143 (H) 151 (H) 138 (H) 121 (H) 128 (H) 132 (H) 128 (H) 153 (H) 132 (H) 116 (H)   Review of Glycemic Control  Diabetes history: DM2 Outpatient Diabetes medications: 630G Medtronic insulin pump with Novolog (also uses Dexcom CGM) Current orders for Inpatient glycemic control: IV insulin drip  Inpatient Diabetes Program Recommendations:  Insulin - IV drip/GlucoStabilizer: Currently ordered IV insulin for treatment of hypertriglyeridemia. Outpatient DM management: Patient is followed by Dr. Gabriel Carina (Endocrinologist) for DM control as an outpatient and was last seen on 01/12/18.  NOTE: Patient was last seen by Dr. Gabriel Carina on 01/12/18 and was noted to have mild abdominal pain and Dr. Gabriel Carina counseled patient could be early pancreatitis. Per office note on 01/12/18, the following should be patient's insulin pump settings:  Basal rates 12 am 3.35 units/hr  24-hr basal = 80.4 units  Bolus settings I:C ratio  12 am 1:3 grams (1 unit covers 3 grams of carbohydrates) 3 pm 1:2 grams  (1 unit covers 2 grams of carbohydrates)  Sensitivity Factor 12 am 1:15 (1 unit drops glucose 15 mg/dl)  Target Glucose 100-110 mg/dl  Will continue to follow along.   Thanks, Barnie Alderman, RN, MSN, CDE Diabetes Coordinator Inpatient Diabetes Program [phone] (Team Pager from 8am to 5pm)

## 2018-01-17 LAB — GLUCOSE, CAPILLARY
GLUCOSE-CAPILLARY: 155 mg/dL — AB (ref 65–99)
GLUCOSE-CAPILLARY: 188 mg/dL — AB (ref 65–99)
GLUCOSE-CAPILLARY: 148 mg/dL — AB (ref 65–99)
GLUCOSE-CAPILLARY: 176 mg/dL — AB (ref 65–99)
GLUCOSE-CAPILLARY: 101 mg/dL — AB (ref 65–99)
GLUCOSE-CAPILLARY: 121 mg/dL — AB (ref 65–99)
GLUCOSE-CAPILLARY: 88 mg/dL (ref 65–99)
GLUCOSE-CAPILLARY: 84 mg/dL (ref 65–99)
GLUCOSE-CAPILLARY: 97 mg/dL (ref 65–99)
GLUCOSE-CAPILLARY: 146 mg/dL — AB (ref 65–99)
GLUCOSE-CAPILLARY: 95 mg/dL (ref 65–99)
GLUCOSE-CAPILLARY: 106 mg/dL — AB (ref 65–99)
GLUCOSE-CAPILLARY: 115 mg/dL — AB (ref 65–99)
Glucose-Capillary: 155 mg/dL — ABNORMAL HIGH (ref 65–99)
Glucose-Capillary: 164 mg/dL — ABNORMAL HIGH (ref 65–99)
Glucose-Capillary: 215 mg/dL — ABNORMAL HIGH (ref 65–99)
Glucose-Capillary: 212 mg/dL — ABNORMAL HIGH (ref 65–99)
Glucose-Capillary: 93 mg/dL (ref 65–99)
Glucose-Capillary: 73 mg/dL (ref 65–99)
Glucose-Capillary: 125 mg/dL — ABNORMAL HIGH (ref 65–99)
Glucose-Capillary: 151 mg/dL — ABNORMAL HIGH (ref 65–99)

## 2018-01-17 LAB — BASIC METABOLIC PANEL
ANION GAP: 6 (ref 5–15)
Anion gap: 10 (ref 5–15)
BUN: 7 mg/dL (ref 6–20)
BUN: 5 mg/dL — AB (ref 6–20)
CHLORIDE: 96 mmol/L — AB (ref 101–111)
CHLORIDE: 93 mmol/L — AB (ref 101–111)
CO2: 27 mmol/L (ref 22–32)
CO2: 28 mmol/L (ref 22–32)
CREATININE: 0.8 mg/dL (ref 0.61–1.24)
Calcium: 8.2 mg/dL — ABNORMAL LOW (ref 8.9–10.3)
Calcium: 8.8 mg/dL — ABNORMAL LOW (ref 8.9–10.3)
Creatinine, Ser: 0.72 mg/dL (ref 0.61–1.24)
GFR calc Af Amer: >60 mL/min (ref >60)
GFR calc Af Amer: >60 mL/min (ref >60)
GFR calc non Af Amer: >60 mL/min (ref >60)
GFR calc non Af Amer: >60 mL/min (ref >60)
Glucose, Bld: 178 mg/dL — ABNORMAL HIGH (ref 65–99)
Glucose, Bld: 144 mg/dL — ABNORMAL HIGH (ref 65–99)
POTASSIUM: 3.2 mmol/L — AB (ref 3.5–5.1)
POTASSIUM: 3.8 mmol/L (ref 3.5–5.1)
Sodium: 129 mmol/L — ABNORMAL LOW (ref 135–145)
Sodium: 131 mmol/L — ABNORMAL LOW (ref 135–145)

## 2018-01-17 LAB — MAGNESIUM
MAGNESIUM: 1.7 mg/dL (ref 1.7–2.4)
MAGNESIUM: 1.8 mg/dL (ref 1.7–2.4)

## 2018-01-17 LAB — TRIGLYCERIDES
TRIGLYCERIDES: 2633 mg/dL — AB (ref <150)
TRIGLYCERIDES: 2559 mg/dL — AB (ref <150)
Triglycerides: 2149 mg/dL — ABNORMAL HIGH (ref <150)

## 2018-01-17 MED ORDER — SODIUM CHLORIDE 4 MEQ/ML IV SOLN
INTRAVENOUS | Status: DC
Start: 2018-01-17 — End: 2018-01-19
  Administered 2018-01-17 – 2018-01-19 (×5): via INTRAVENOUS
  Filled 2018-01-17 (×18): qty 1000

## 2018-01-17 MED ORDER — HYDROMORPHONE HCL 2 MG PO TABS
2.0000 mg | ORAL_TABLET | ORAL | Status: DC | PRN
  Administered 2018-01-17 – 2018-01-18 (×3): 2 mg via ORAL
  Filled 2018-01-17 (×3): qty 1

## 2018-01-17 MED ORDER — POTASSIUM CHLORIDE CRYS ER 20 MEQ PO TBCR
40.0000 meq | EXTENDED_RELEASE_TABLET | ORAL | Status: DC
  Administered 2018-01-17: 40 meq via ORAL
  Filled 2018-01-17: qty 2

## 2018-01-17 MED ORDER — HYDROMORPHONE HCL 1 MG/ML IJ SOLN
INTRAMUSCULAR | Status: AC
  Administered 2018-01-17: 2 mg via INTRAVENOUS
  Filled 2018-01-17: qty 2

## 2018-01-17 MED ORDER — OXYCODONE HCL 5 MG PO TABS
5.0000 mg | ORAL_TABLET | Freq: Four times a day (QID) | ORAL | Status: DC
  Administered 2018-01-17 – 2018-01-20 (×12): 5 mg via ORAL
  Filled 2018-01-17 (×12): qty 1

## 2018-01-17 MED ORDER — DEXTROSE 5 % IV SOLN
INTRAVENOUS | Status: DC
  Administered 2018-01-17: 06:00:00 via INTRAVENOUS

## 2018-01-17 MED ORDER — HYDROMORPHONE HCL 1 MG/ML IJ SOLN
2.0000 mg | Freq: Four times a day (QID) | INTRAMUSCULAR | Status: DC | PRN
Start: 2018-01-17 — End: 2018-01-17

## 2018-01-17 MED ORDER — HYDROMORPHONE HCL 1 MG/ML IJ SOLN
2.0000 mg | INTRAMUSCULAR | Status: DC | PRN
  Administered 2018-01-17: 2 mg via INTRAVENOUS
  Filled 2018-01-17: qty 2

## 2018-01-17 MED ORDER — HYDROMORPHONE HCL 1 MG/ML IJ SOLN
0.5000 mg | Freq: Four times a day (QID) | INTRAMUSCULAR | Status: DC | PRN
Start: 2018-01-17 — End: 2018-01-17
  Administered 2018-01-17: 0.5 mg via INTRAVENOUS
  Filled 2018-01-17: qty 1

## 2018-01-17 MED ORDER — POTASSIUM CHLORIDE CRYS ER 20 MEQ PO TBCR
20.0000 meq | EXTENDED_RELEASE_TABLET | Freq: Once | ORAL | Status: AC
  Administered 2018-01-17: 20 meq via ORAL
  Filled 2018-01-17: qty 1

## 2018-01-17 MED ORDER — HYDROMORPHONE HCL 1 MG/ML IJ SOLN
2.0000 mg | INTRAMUSCULAR | Status: DC | PRN
  Administered 2018-01-17 (×2): 2 mg via INTRAVENOUS
  Filled 2018-01-17: qty 2

## 2018-01-17 NOTE — Progress Notes (Signed)
Updated Dr. Mortimer Fries on insulin and D10 drips and the pt's blood sugar results.  Per Dr. Mortimer Fries, switch D10 to D5 and continue.

## 2018-01-17 NOTE — Progress Notes (Signed)
[facility] at [place] NAME: Sergio [name]    MR#:  [id number]  DATE OF BIRTH:  10/06/1976  SUBJECTIVE:  CHIEF COMPLAINT:   Chief Complaint  Patient presents with  . Abdominal Pain   -Still has abdominal pain.  Requesting to increase the pain medication dose. -Triglycerides remain elevated  REVIEW OF SYSTEMS:  Review of Systems  Constitutional: Negative for chills and fever.  HENT: Negative for congestion, ear discharge, hearing loss and nosebleeds.   Eyes: Negative for blurred vision and double vision.  Respiratory: Negative for cough, shortness of breath and wheezing.   Cardiovascular: Negative for chest pain and palpitations.  Gastrointestinal: Positive for abdominal pain and vomiting. Negative for constipation, diarrhea and nausea.  Genitourinary: Negative for dysuria.  Neurological: Negative for dizziness, seizures and headaches.    DRUG ALLERGIES:   Allergies  Allergen Reactions  . Codeine Anaphylaxis  . Ivp Dye [Iodinated Diagnostic Agents] Other (See Comments)    Kidneys stop working    VITALS:  Blood pressure 112/81, pulse 76, temperature 97.7 F (36.5 C), temperature source Oral, resp. rate 16, height 5\' 11"  (1.803 m), weight 110.9 kg (244 lb 7.8 oz), SpO2 94 %.  PHYSICAL EXAMINATION:  Physical Exam  GENERAL:  42 y.o.-year-old patient lying in the bed with no acute distress.  EYES: Pupils equal, round, reactive to light and accommodation. No scleral icterus. Extraocular muscles intact.  HEENT: Head atraumatic, normocephalic. Oropharynx and nasopharynx clear.  NECK:  Supple, no jugular venous distention. No thyroid enlargement, no tenderness.  LUNGS: Normal breath sounds bilaterally, no wheezing, rales,rhonchi or crepitation. No use of accessory muscles of respiration.  CARDIOVASCULAR: S1, S2 normal. No murmurs, rubs, or gallops.  ABDOMEN: Soft, tender in epigastric and mid abdomen, nondistended. Bowel sounds  present. No organomegaly or mass.  EXTREMITIES: No pedal edema, cyanosis, or clubbing.  NEUROLOGIC: Cranial nerves II through XII are intact. Muscle strength 5/5 in all extremities. Sensation intact. Gait not checked.  PSYCHIATRIC: The patient is alert and oriented x 3.  SKIN: No obvious rash, lesion, or ulcer.    LABORATORY PANEL:   CBC Recent Labs  Lab 01/16/18 0316  WBC 3.8  HGB 14.9  HCT 42.1  PLT 164   ------------------------------------------------------------------------------------------------------------------  Chemistries  Recent Labs  Lab 01/16/18 0316 01/17/18 0608  NA 131* 129*  K 3.5 3.2*  CL 97* 96*  CO2 21* 27  GLUCOSE 120* 178*  BUN 11 7  CREATININE 0.81 0.72  CALCIUM 8.4* 8.2*  MG  --  1.7  AST 31  --   ALT 28  --   ALKPHOS 48  --   BILITOT 1.4*  --    ------------------------------------------------------------------------------------------------------------------  Cardiac Enzymes No results for input(s): TROPONINI in the last 168 hours. ------------------------------------------------------------------------------------------------------------------  RADIOLOGY:  No results found.  EKG:   Orders placed or performed during the hospital encounter of 01/15/18  . EKG 12-Lead  . EKG 12-Lead  . ED EKG  . ED EKG  . EKG    ASSESSMENT AND PLAN:    42 year old male with known history of familial hypertriglyceridemia, hypertension, diabetes mellitus, recurrent admissions for pancreatitis presents again with abdominal pain  1. Acute on chronic pancreatitis-triggered by hypertriglyceridemia -Continue IV insulin drip and dextrose until triglycerides are less than thousand -remains nothing by mouth-intensivist has upgraded his diet to clear liquids today -IV fluids -supportive management  2. Diabetes mellitus- currently on insulin drip Follows with endocrinologist as outpatient -Insulin pump is turned  off.  But because his sugars are low while  on insulin drip and he is n.p.o., D5 is being given  3. Hypokalemia-replaced Hyponatremia-give IV fluids while n.p.o.  4.Hypertriglyceridemia-continue gemfibrozil, recommend adding statin  5. Depression and anxiety-continue home medications  6. DVT prophylaxis-Lovenox   All the records are reviewed and case discussed with Care Management/Social Workerr. Management plans discussed with the patient, family and they are in agreement.  CODE STATUS: Full Code  TOTAL TIME TAKING CARE OF THIS PATIENT: 36 minutes.   POSSIBLE D/C IN 2 DAYS, DEPENDING ON CLINICAL CONDITION.   Gladstone Lighter M.D on 01/17/2018 at 1:26 PM  Between 7am to 6pm - Pager - [phone]  After 6pm go to [email or web address] - password [email or web address] [facility]  Office  [phone]  CC: Primary care physician; Crecencio Mc, MD

## 2018-01-17 NOTE — Progress Notes (Signed)
Pharmacy Electrolyte Monitoring Consult:  Pharmacy consulted to assist in monitoring and replacing electrolytes in this 42 y.o. male admitted on 01/15/2018 with pancreatitis.   Patient currently receiving high dose insulin drip and D5 @ 273mL/hr.   Patient received potassium 51mEq PO x 1 this am.   Labs:  Sodium (mmol/L)  Date Value  01/17/2018 129 (L)  11/04/2014 137   Potassium (mmol/L)  Date Value  01/17/2018 3.2 (L)  11/04/2014 3.6   Magnesium (mg/dL)  Date Value  01/17/2018 1.7  03/18/2014 1.9   Phosphorus (mg/dL)  Date Value  10/22/2017 3.7   Calcium (mg/dL)  Date Value  01/17/2018 8.2 (L)   Calcium, Total (mg/dL)  Date Value  11/04/2014 8.5   Albumin (g/dL)  Date Value  01/16/2018 4.2  11/01/2014 3.0 (L)    Plan:  Will order addition potassium 51mEq PO x 1. Will recheck electrolytes at 5am and 5pm while patient is receiving high dose insulin infusion.   Per conversation with CCM will transition patient to D10NS, sodium is trending down.   Pharmacy will continue to monitor and adjust per consult.     Izyan Ezzell L 01/17/2018 3:42 PM

## 2018-01-17 NOTE — Progress Notes (Signed)
eLink Physician-Brief Progress Note Patient Name: Sergio [name] DOB: 1976/01/04 MRN: [id number]   Date of Service  01/17/2018  HPI/Events of Note  Request to change IV Dilaudid to PO.  Medical record states that patient is allergic to codeine, however, he has been on Dilaudid at hom without incident.   eICU Interventions  Will change Dilaudid order to 2 mg PO Q 4 hours PRN pain.     Intervention Category Intermediate Interventions: Pain - evaluation and management  [name] 01/17/2018, 9:36 PM

## 2018-01-17 NOTE — Progress Notes (Signed)
Received report from [facility] regarding insulin drip at a fixed rate of 8 units an hour. D5 titration up and down by 10 to achieve a target blood sugar of 130-200. Advised charge nurse and Micheal from pharmacy that orders entered in system does not match what has been reported to me and needs to be correct with parameters. Charge will contact [facility] for clarification.

## 2018-01-17 NOTE — Progress Notes (Signed)
Spoke with pharmacy about D10 rate of infusion.  Pt still at a CBG of 119 and D10 currently running at 238ml/hr.  Insulin rate was decreased to 8 units/hr per Dr. Oletta Darter.  Per pharmacy, let it run another hour at 238ml/hr and insulin drip at 8units/hr and then recheck CBG.  At that time reassess drip rates.

## 2018-01-17 NOTE — Progress Notes (Signed)
Pharmacy Electrolyte Monitoring Consult:  Pharmacy consulted to assist in monitoring and replacing electrolytes in this 42 y.o. male admitted on 01/15/2018 with pancreatitis.   Patient currently receiving high dose insulin drip and D5 @ 270mL/hr.   Patient received potassium 8mEq PO x 2 this am.   Labs:  Sodium (mmol/L)  Date Value  01/17/2018 131 (L)  11/04/2014 137   Potassium (mmol/L)  Date Value  01/17/2018 3.8  11/04/2014 3.6   Magnesium (mg/dL)  Date Value  01/17/2018 1.8  03/18/2014 1.9   Phosphorus (mg/dL)  Date Value  10/22/2017 3.7   Calcium (mg/dL)  Date Value  01/17/2018 8.8 (L)   Calcium, Total (mg/dL)  Date Value  11/04/2014 8.5   Albumin (g/dL)  Date Value  01/16/2018 4.2  11/01/2014 3.0 (L)    Plan:  Will order addition potassium 80mEq PO x 1. Will recheck electrolytes at 5am and 5pm while patient is receiving high dose insulin infusion.   Pharmacy will continue to monitor and adjust per consult.     Ameilia Rattan L 01/17/2018 6:57 PM

## 2018-01-18 LAB — BASIC METABOLIC PANEL
ANION GAP: 4 — AB (ref 5–15)
Anion gap: 5 (ref 5–15)
BUN: 5 mg/dL — AB (ref 6–20)
BUN: 5 mg/dL — ABNORMAL LOW (ref 6–20)
CALCIUM: 8.4 mg/dL — AB (ref 8.9–10.3)
CALCIUM: 8.4 mg/dL — AB (ref 8.9–10.3)
CO2: 29 mmol/L (ref 22–32)
CO2: 27 mmol/L (ref 22–32)
CREATININE: 0.9 mg/dL (ref 0.61–1.24)
Chloride: 99 mmol/L — ABNORMAL LOW (ref 101–111)
Chloride: 104 mmol/L (ref 101–111)
Creatinine, Ser: 0.83 mg/dL (ref 0.61–1.24)
GFR calc Af Amer: >60 mL/min (ref >60)
GLUCOSE: 156 mg/dL — AB (ref 65–99)
Glucose, Bld: 142 mg/dL — ABNORMAL HIGH (ref 65–99)
Potassium: 3.8 mmol/L (ref 3.5–5.1)
Potassium: 3.6 mmol/L (ref 3.5–5.1)
SODIUM: 133 mmol/L — AB (ref 135–145)
SODIUM: 135 mmol/L (ref 135–145)

## 2018-01-18 LAB — GLUCOSE, CAPILLARY
GLUCOSE-CAPILLARY: 121 mg/dL — AB (ref 65–99)
GLUCOSE-CAPILLARY: 126 mg/dL — AB (ref 65–99)
GLUCOSE-CAPILLARY: 149 mg/dL — AB (ref 65–99)
GLUCOSE-CAPILLARY: 157 mg/dL — AB (ref 65–99)
GLUCOSE-CAPILLARY: 157 mg/dL — AB (ref 65–99)
GLUCOSE-CAPILLARY: 162 mg/dL — AB (ref 65–99)
GLUCOSE-CAPILLARY: 201 mg/dL — AB (ref 65–99)
Glucose-Capillary: 133 mg/dL — ABNORMAL HIGH (ref 65–99)
Glucose-Capillary: 151 mg/dL — ABNORMAL HIGH (ref 65–99)
Glucose-Capillary: 130 mg/dL — ABNORMAL HIGH (ref 65–99)
Glucose-Capillary: 185 mg/dL — ABNORMAL HIGH (ref 65–99)
Glucose-Capillary: 181 mg/dL — ABNORMAL HIGH (ref 65–99)
Glucose-Capillary: 150 mg/dL — ABNORMAL HIGH (ref 65–99)
Glucose-Capillary: 177 mg/dL — ABNORMAL HIGH (ref 65–99)
Glucose-Capillary: 141 mg/dL — ABNORMAL HIGH (ref 65–99)
Glucose-Capillary: 78 mg/dL (ref 65–99)
Glucose-Capillary: 79 mg/dL (ref 65–99)
Glucose-Capillary: 80 mg/dL (ref 65–99)
Glucose-Capillary: 85 mg/dL (ref 65–99)

## 2018-01-18 LAB — TRIGLYCERIDES
TRIGLYCERIDES: 1980 mg/dL — AB (ref <150)
TRIGLYCERIDES: 1519 mg/dL — AB (ref <150)

## 2018-01-18 MED ORDER — SODIUM CHLORIDE 0.9 % IV SOLN
10.0000 [IU]/h | INTRAVENOUS | Status: DC
  Administered 2018-01-19 (×2): 10 [IU]/h via INTRAVENOUS
  Filled 2018-01-18 (×2): qty 1

## 2018-01-18 MED ORDER — HYDROMORPHONE HCL 1 MG/ML IJ SOLN
2.0000 mg | INTRAMUSCULAR | Status: DC | PRN
  Administered 2018-01-18: 1 mg via INTRAVENOUS
  Administered 2018-01-18 – 2018-01-21 (×14): 2 mg via INTRAVENOUS
  Filled 2018-01-18 (×15): qty 2

## 2018-01-18 MED ORDER — HYDROMORPHONE HCL 1 MG/ML IJ SOLN
1.0000 mg | INTRAMUSCULAR | Status: DC | PRN
  Administered 2018-01-18: 1 mg via INTRAVENOUS
  Filled 2018-01-18: qty 1

## 2018-01-18 NOTE — Progress Notes (Signed)
Pt and pt's family have expressed a dissatisfaction of their stay in the ICU this time. Pt noted to be on glucostabilizer at the beginning of my shift with insulin rate at 1.8. Pt also noted to be receiving 2 mg dilaudid PO and pt was requesting 2 mg IV. Pt and his family were angry that doctors were not communicating with them and informing them of changes in patients care. I noted Dr. Alva Garnet go into pt's room at least 3 times during the shift to consult with the pt and his family. Pt and his wife want pt to be on 80 units of insulin daily. I have talked with Dr. Alva Garnet and pharmacy regarding insulin gtt. The plan was to titrate insulin gtt up as we titrate D10 up to maintain CBG. Insulin gtt had been titrated up to 8 units and pt was still not happy about same. Dr. Alva Garnet also gave verbal orders for 1 mg dilaudid IV. Pt and family were not satisfied with the change. Pt's wife walked to [facility] to have Dr. Gabriel Carina come see pt and talk to Dr. Alva Garnet about pt's care. After speaking with Dr. Gabriel Carina, Dr. Alva Garnet ordered insulin infusion at 10 units/hour and 2 mg dilaudid IV. Pt seemed pleased with the changes at this time. Will continue to monitor.

## 2018-01-18 NOTE — Progress Notes (Signed)
Patient is a frequent client of our ICU/SDU.  He has recurrent severe hypertriglyceridemia.  Presently on insulin infusion.  Merton Border, MD PCCM service Mobile [phone] Pager [phone] 01/18/2018 4:29 PM

## 2018-01-18 NOTE — Progress Notes (Signed)
Patient reports that his insuline should be running at 10 to 15 units so it can quickly reduce the triglycerides.  Call placed to pharmacist to inquire, Pharmacist recommend waiting to triglycerides to result to see if the current treatment is effective. Will continue to monitor and endorse.

## 2018-01-18 NOTE — Progress Notes (Signed)
[facility] at [place] NAME: Arya Boxley    MR#:  [id number]  DATE OF BIRTH:  25-Apr-1976  SUBJECTIVE:   No acute events reported overnight  Still has abdominal pain  REVIEW OF SYSTEMS:    Review of Systems  Constitutional: Negative for fever, chills weight loss HENT: Negative for ear pain, nosebleeds, congestion, facial swelling, rhinorrhea, neck pain, neck stiffness and ear discharge.   Respiratory: Negative for cough, shortness of breath, wheezing  Cardiovascular: Negative for chest pain, palpitations and leg swelling.  Gastrointestinal: Negative for heartburn, ++abdominal pain, NO vomiting, diarrhea or consitpation Genitourinary: Negative for dysuria, urgency, frequency, hematuria Musculoskeletal: Negative for back pain or joint pain Neurological: Negative for dizziness, seizures, syncope, focal weakness,  numbness and headaches.  Hematological: Does not bruise/bleed easily.  Psychiatric/Behavioral: Negative for hallucinations, confusion, dysphoric mood    Tolerating Diet: yes      DRUG ALLERGIES:   Allergies  Allergen Reactions  . Codeine Anaphylaxis  . Ivp Dye [Iodinated Diagnostic Agents] Other (See Comments)    Kidneys stop working    VITALS:  Blood pressure 101/67, pulse 69, temperature 98.1 F (36.7 C), temperature source Axillary, resp. rate (!) 9, height 5\' 11"  (1.803 m), weight 110.9 kg (244 lb 7.8 oz), SpO2 92 %.  PHYSICAL EXAMINATION:  Constitutional: Appears well-developed and well-nourished. No distress. HENT: Normocephalic. [name] Oropharynx is clear and moist.  Eyes: Conjunctivae and EOM are normal. PERRLA, no scleral icterus.  Neck: Normal ROM. Neck supple. No JVD. No tracheal deviation. CVS: RRR, S1/S2 +, no murmurs, no gallops, no carotid bruit.  Pulmonary: Effort and breath sounds normal, no stridor, rhonchi, wheezes, rales.  Abdominal: Soft. BS +, generalized epigastric tenderness without rebound or  guarding Musculoskeletal: Normal range of motion. No edema and no tenderness.  Neuro: Alert. CN 2-12 grossly intact. No focal deficits. Skin: Skin is warm and dry. No rash noted. Psychiatric: Normal mood and affect.      LABORATORY PANEL:   CBC Recent Labs  Lab 01/16/18 0316  WBC 3.8  HGB 14.9  HCT 42.1  PLT 164   ------------------------------------------------------------------------------------------------------------------  Chemistries  Recent Labs  Lab 01/16/18 0316  01/17/18 1707 01/18/18 0417  NA 131*   < > 131* 133*  K 3.5   < > 3.8 3.8  CL 97*   < > 93* 99*  CO2 21*   < > 28 29  GLUCOSE 120*   < > 144* 142*  BUN 11   < > 5* 5*  CREATININE 0.81   < > 0.80 0.90  CALCIUM 8.4*   < > 8.8* 8.4*  MG  --    < > 1.8  --   AST 31  --   --   --   ALT 28  --   --   --   ALKPHOS 48  --   --   --   BILITOT 1.4*  --   --   --    < > = values in this interval not displayed.   ------------------------------------------------------------------------------------------------------------------  Cardiac Enzymes No results for input(s): TROPONINI in the last 168 hours. ------------------------------------------------------------------------------------------------------------------  RADIOLOGY:  No results found.   ASSESSMENT AND PLAN:   42 year old male with history of diabetes and hypertriglyceridemia who presents with abdominal pain.  1.  Acute on chronic pancreatitis due to elevated triglyceride level  Continue insulin drip and dextrose drip until triglyceride level is less than thousand  2.  Diabetes: Continue insulin drip  3.  Hypertriglyceridemia: Continue gemfibrozil  4.  Hypokalemia: Replete as needed 5.  Hyponatremia from elevated blood sugars  6.  Depression/ anxiety: Continue Zoloft  7.  BPH: Continue Flomax and finasteride 8.  Sleep apnea: Continue CPAP at night     Management plans discussed with the patient and he is in agreement.  CODE  STATUS: FULL  TOTAL TIME TAKING CARE OF THIS PATIENT: 24 minutes.     POSSIBLE D/C 3-5 days, DEPENDING ON CLINICAL CONDITION.   Adain Geurin M.D on 01/18/2018 at 11:09 AM  Between 7am to 6pm - Pager - [phone] After 6pm go to [email or web address] - password [email or web address] [facility] [place] [facility]  Office  [phone]  CC: Primary care physician; Crecencio Mc, MD  Note: This dictation was prepared with Dragon dictation along with smaller phrase technology. Any transcriptional errors that result from this process are unintentional.

## 2018-01-18 NOTE — Progress Notes (Addendum)
Pharmacy Electrolyte Monitoring Consult:  Pharmacy consulted to assist in monitoring and replacing electrolytes in this 42 y.o. male admitted on 01/15/2018 with pancreatitis.   Patient currently receiving continuous insulin drip and D10NS @ 164mL/hr.   Patient received potassium 112mEq of oral potassium replacement on 4/9.    Labs:  Sodium (mmol/L)  Date Value  01/18/2018 133 (L)  11/04/2014 137   Potassium (mmol/L)  Date Value  01/18/2018 3.8  11/04/2014 3.6   Magnesium (mg/dL)  Date Value  01/17/2018 1.8  03/18/2014 1.9   Phosphorus (mg/dL)  Date Value  10/22/2017 3.7   Calcium (mg/dL)  Date Value  01/18/2018 8.4 (L)   Calcium, Total (mg/dL)  Date Value  11/04/2014 8.5   Albumin (g/dL)  Date Value  01/16/2018 4.2  11/01/2014 3.0 (L)    Plan: Continue D10NS @ 245ml/hr. Sodium is continuing to correct.   Will continue to check electrolytes at 5am and 5pm while patient is on insulin infusion.   Pharmacy will continue to monitor and adjust per consult.    Simpson,Michael L 01/18/2018 9:48 AM

## 2018-01-18 NOTE — Plan of Care (Signed)
When patient was asked about pain during follow up pain assessment, pain shrugged shoulders and rolled his eyes while responding, "I don't know how my pain is". Patient was asked to rate the pain on a scale of 1-10. Patient reported pain to be and "8" while in and out of sleep. Patient had several question about dilaudid being changed from IV to oral and prefer to have pain medication through the IV. Patient was educated on the risk and harms of narcotic IV pain medication. Patient noted with decreased respiratory rate and oxygen saturation after receiving oral dilaudid, lyrica 300mg , ativan, trazodone, and IV toradol this shift. Patient is now resting with eyes closed, respirations even and non-labored with a rate of 13 and CPAP (from home) in place. HOB elevated to 30 degrees. Call bell and all personal belongings are within reach. Will continue to monitor and endorse.

## 2018-01-18 NOTE — Progress Notes (Signed)
Pt noted to be on glucostabilizer. Discontinued same due to only titrating insulin gtt per [facility] orders.

## 2018-01-19 ENCOUNTER — Encounter: Payer: Self-pay | Admitting: Pulmonary Disease

## 2018-01-19 LAB — BASIC METABOLIC PANEL
Anion gap: 6 (ref 5–15)
Anion gap: 4 — ABNORMAL LOW (ref 5–15)
BUN: 6 mg/dL (ref 6–20)
CALCIUM: 8.3 mg/dL — AB (ref 8.9–10.3)
CO2: 26 mmol/L (ref 22–32)
CO2: 28 mmol/L (ref 22–32)
CREATININE: 0.9 mg/dL (ref 0.61–1.24)
Calcium: 8.1 mg/dL — ABNORMAL LOW (ref 8.9–10.3)
Chloride: 106 mmol/L (ref 101–111)
Chloride: 104 mmol/L (ref 101–111)
Creatinine, Ser: 0.78 mg/dL (ref 0.61–1.24)
GFR calc Af Amer: >60 mL/min (ref >60)
GFR calc Af Amer: >60 mL/min (ref >60)
GLUCOSE: 116 mg/dL — AB (ref 65–99)
Glucose, Bld: 100 mg/dL — ABNORMAL HIGH (ref 65–99)
POTASSIUM: 3.7 mmol/L (ref 3.5–5.1)
Potassium: 3.4 mmol/L — ABNORMAL LOW (ref 3.5–5.1)
SODIUM: 138 mmol/L (ref 135–145)
Sodium: 136 mmol/L (ref 135–145)

## 2018-01-19 LAB — GLUCOSE, CAPILLARY
GLUCOSE-CAPILLARY: 86 mg/dL (ref 65–99)
GLUCOSE-CAPILLARY: 85 mg/dL (ref 65–99)
GLUCOSE-CAPILLARY: 115 mg/dL — AB (ref 65–99)
Glucose-Capillary: 102 mg/dL — ABNORMAL HIGH (ref 65–99)
Glucose-Capillary: 106 mg/dL — ABNORMAL HIGH (ref 65–99)
Glucose-Capillary: 107 mg/dL — ABNORMAL HIGH (ref 65–99)
Glucose-Capillary: 109 mg/dL — ABNORMAL HIGH (ref 65–99)
Glucose-Capillary: 119 mg/dL — ABNORMAL HIGH (ref 65–99)
Glucose-Capillary: 71 mg/dL (ref 65–99)
Glucose-Capillary: 80 mg/dL (ref 65–99)
Glucose-Capillary: 89 mg/dL (ref 65–99)
Glucose-Capillary: 96 mg/dL (ref 65–99)
Glucose-Capillary: 99 mg/dL (ref 65–99)
Glucose-Capillary: 99 mg/dL (ref 65–99)

## 2018-01-19 LAB — TRIGLYCERIDES
TRIGLYCERIDES: 1184 mg/dL — AB (ref <150)
TRIGLYCERIDES: 923 mg/dL — AB (ref <150)
TRIGLYCERIDES: 1034 mg/dL — AB (ref <150)

## 2018-01-19 MED ORDER — POTASSIUM CHLORIDE CRYS ER 20 MEQ PO TBCR
40.0000 meq | EXTENDED_RELEASE_TABLET | Freq: Once | ORAL | Status: AC
  Administered 2018-01-19: 40 meq via ORAL
  Filled 2018-01-19: qty 2

## 2018-01-19 MED ORDER — INSULIN PUMP
SUBCUTANEOUS | Status: DC
  Administered 2018-01-19 – 2018-01-20 (×7): via SUBCUTANEOUS
  Administered 2018-01-20: 3.8 via SUBCUTANEOUS
  Administered 2018-01-21 – 2018-01-22 (×7): via SUBCUTANEOUS
  Filled 2018-01-19: qty 1

## 2018-01-19 NOTE — Progress Notes (Signed)
[facility] at [place] NAME: Sergio [name]    MR#:  [id number]  DATE OF BIRTH:  [birth year]  SUBJECTIVE:   Patient's abdominal pain has improved. REVIEW OF SYSTEMS:    Review of Systems  Constitutional: Negative for fever, chills weight loss HENT: Negative for ear pain, nosebleeds, congestion, facial swelling, rhinorrhea, neck pain, neck stiffness and ear discharge.   Respiratory: Negative for cough, shortness of breath, wheezing  Cardiovascular: Negative for chest pain, palpitations and leg swelling.  Gastrointestinal: Negative for heartburn, ++abdominal pain, NO vomiting, diarrhea or consitpation Genitourinary: Negative for dysuria, urgency, frequency, hematuria Musculoskeletal: Negative for back pain or joint pain Neurological: Negative for dizziness, seizures, syncope, focal weakness,  numbness and headaches.  Hematological: Does not bruise/bleed easily.  Psychiatric/Behavioral: Negative for hallucinations, confusion, dysphoric mood    Tolerating Diet: yes      DRUG ALLERGIES:   Allergies  Allergen Reactions  . Codeine Anaphylaxis  . Ivp Dye [Iodinated Diagnostic Agents] Other (See Comments)    Kidneys stop working    VITALS:  Blood pressure 130/81, pulse 74, temperature 98.2 F (36.8 C), temperature source Oral, resp. rate 14, height 5\' 11"  (1.803 m), weight 110.9 kg (244 lb 7.8 oz), SpO2 91 %.  PHYSICAL EXAMINATION:  Constitutional: Appears well-developed and well-nourished. No distress. HENT: Normocephalic. [name] Oropharynx is clear and moist.  Eyes: Conjunctivae and EOM are normal. PERRLA, no scleral icterus.  Neck: Normal ROM. Neck supple. No JVD. No tracheal deviation. CVS: RRR, S1/S2 +, no murmurs, no gallops, no carotid bruit.  Pulmonary: Effort and breath sounds normal, no stridor, rhonchi, wheezes, rales.  Abdominal: Soft. BS +, generalized epigastric tenderness without rebound or guarding Musculoskeletal: Normal  range of motion. No edema and no tenderness.  Neuro: Alert. CN 2-12 grossly intact. No focal deficits. Skin: Skin is warm and dry. No rash noted. Psychiatric: Normal mood and affect.      LABORATORY PANEL:   CBC Recent Labs  Lab 01/16/18 0316  WBC 3.8  HGB 14.9  HCT 42.1  PLT 164   ------------------------------------------------------------------------------------------------------------------  Chemistries  Recent Labs  Lab 01/16/18 0316  01/17/18 1707  01/19/18 0517  NA 131*   < > 131*   < > 138  K 3.5   < > 3.8   < > 3.7  CL 97*   < > 93*   < > 106  CO2 21*   < > 28   < > 26  GLUCOSE 120*   < > 144*   < > 100*  BUN 11   < > 5*   < > 6  CREATININE 0.81   < > 0.80   < > 0.90  CALCIUM 8.4*   < > 8.8*   < > 8.1*  MG  --    < > 1.8  --   --   AST 31  --   --   --   --   ALT 28  --   --   --   --   ALKPHOS 48  --   --   --   --   BILITOT 1.4*  --   --   --   --    < > = values in this interval not displayed.   ------------------------------------------------------------------------------------------------------------------  Cardiac Enzymes No results for input(s): TROPONINI in the last 168 hours. ------------------------------------------------------------------------------------------------------------------  RADIOLOGY:  No results found.   ASSESSMENT AND PLAN:   42 year old male with history of  diabetes and hypertriglyceridemia who presents with abdominal pain.  1.  Acute on chronic pancreatitis due to elevated triglyceride level  Continue insulin drip and dextrose drip until triglyceride level is less than 1000.  2.  Diabetes: Continue insulin drip as per recommendations by Dr. Gabriel Carina  3.  Hypertriglyceridemia: Continue gemfibrozil Follow triglyceride level daily. Triglyceride level 1184 centimeters. 4.  Hypokalemia: Replete as needed 5.  Hyponatremia from elevated blood sugars  6.  Depression/ anxiety: Continue Zoloft  7.  BPH: Continue Flomax and  finasteride 8.  Sleep apnea: Continue CPAP at night     Management plans discussed with the patient and he is in agreement.  CODE STATUS: FULL  TOTAL TIME TAKING CARE OF THIS PATIENT: 24 minutes.     POSSIBLE D/C 3-5 days, DEPENDING ON CLINICAL CONDITION.   Tanaka Gillen M.D on 01/19/2018 at 12:58 PM  Between 7am to 6pm - Pager - [phone] After 6pm go to [email or web address] - password [email or web address] [place] [facility]  Office  [phone]  CC: Primary care physician; Crecencio Mc, MD  Note: This dictation was prepared with Dragon dictation along with smaller phrase technology. Any transcriptional errors that result from this process are unintentional.

## 2018-01-19 NOTE — Progress Notes (Signed)
Pharmacy Electrolyte Monitoring Consult:  Pharmacy consulted to assist in monitoring and replacing electrolytes in this 42 y.o. male admitted on 01/15/2018 with pancreatitis.     Labs:  Sodium (mmol/L)  Date Value  01/19/2018 136  11/04/2014 137   Potassium (mmol/L)  Date Value  01/19/2018 3.4 (L)  11/04/2014 3.6   Magnesium (mg/dL)  Date Value  01/17/2018 1.8  03/18/2014 1.9   Phosphorus (mg/dL)  Date Value  10/22/2017 3.7   Calcium (mg/dL)  Date Value  01/19/2018 8.3 (L)   Calcium, Total (mg/dL)  Date Value  11/04/2014 8.5   Albumin (g/dL)  Date Value  01/16/2018 4.2  11/01/2014 3.0 (L)    Plan: Potassium 41mEq PO x 1. Insulin infusion and D10 have been discontinued.   Pharmacy will continue to monitor and adjust per consult.    Simpson,Michael L 01/19/2018 10:34 PM

## 2018-01-19 NOTE — Progress Notes (Signed)
Pharmacy Electrolyte Monitoring Consult:  Pharmacy consulted to assist in monitoring and replacing electrolytes in this 42 y.o. male admitted on 01/15/2018 with pancreatitis.   Patient currently receiving continuous insulin drip and D10NS @ 258mL/hr.   Patient received potassium 167mEq of oral potassium replacement on 4/9.    Labs:  Sodium (mmol/L)  Date Value  01/19/2018 138  11/04/2014 137   Potassium (mmol/L)  Date Value  01/19/2018 3.7  11/04/2014 3.6   Magnesium (mg/dL)  Date Value  01/17/2018 1.8  03/18/2014 1.9   Phosphorus (mg/dL)  Date Value  10/22/2017 3.7   Calcium (mg/dL)  Date Value  01/19/2018 8.1 (L)   Calcium, Total (mg/dL)  Date Value  11/04/2014 8.5   Albumin (g/dL)  Date Value  01/16/2018 4.2  11/01/2014 3.0 (L)    Plan: Continue D10NS @ 240ml/hr. Sodium has normalized. Anticipate stopping insulin drip this evening as triglycerides < 1000.   Will continue to check electrolytes at 5am and 5pm while patient is on insulin infusion.   Pharmacy will continue to monitor and adjust per consult.    Sergio [name] L 01/19/2018 3:32 PM

## 2018-01-19 NOTE — Progress Notes (Signed)
Pt triglycerides were 923 at 14:37. MD notified. Diabetes coordinator consulted and paged. Patient has contacted his wife and asked her to bring his insulin pump supplies. Once the patient is able to get on his insulin pump we will transition him off of the drip.

## 2018-01-19 NOTE — Progress Notes (Signed)
Pt insulin pump started at 17:15. Insulin drip stopped at 18:15. Pt BG was 119 at 18:15.

## 2018-01-19 NOTE — Progress Notes (Signed)
Inpatient Diabetes Program Recommendations  AACE/ADA: New Consensus Statement on Inpatient Glycemic Control (2015)  Target Ranges:  Prepandial:   less than 140 mg/dL      Peak postprandial:   less than 180 mg/dL (1-2 hours)      Critically ill patients:  140 - 180 mg/dL    Results for Sergio [name], Sergio [name] (MRN [id number]) as of 01/19/2018 15:51  Ref. Range 01/19/2018 14:37  Triglycerides Latest Ref Range: <150 mg/dL 923 (H)    Admit with: Hypertriglyceridemia  History: DM  Followed by Dr. Gabriel Carina (Endocrinologist) for DM control as an outpatient and was last seen on 01/12/18.  Home DM Meds: Insulin Pump Basal rates: 12 am 3.35 units/hr  24-hr basal = 80.4 units Bolus settings: I:C ratio:12 am 1:3 grams (1 unit covers 3 grams of carbohydrates)                3 pm 1:2 grams  (1 unit covers 2 grams of carbohydrates) Sensitivity Factor: 12 am 1:15 (1 unit drops glucose 15 mg/dl) Target Glucose 100-110 mg/dl   Current Insulin Orders: IV Insulin drip     Called by RN, Romie Minus.  RN told me MD would like to transition patient off IV Insulin drip this afternoon since TG level now less than 1000 mg/dl.  Wife of pt has gone home to bring more insulin and insulin pump supplies back to hospital asap.    Once wife arrives with insulin pump supplies, would have pt resume his insulin pump with all new supplies right away.  Continue IV Insulin drip for 1 hour after pump resumed and then can d/c IV Insulin drip (to allow insulin pump to deliver adequate insulin prior to d/c of IV Insulin drip).  Please place Insulin Pump order set into [facility] as well.     --Will follow patient during hospitalization--  Wyn Quaker RN, MSN, CDE Diabetes Coordinator Inpatient Glycemic Control Team Team Pager: [phone] (8a-5p)

## 2018-01-19 NOTE — Progress Notes (Signed)
Report given to Lenna Sciara, RN on [facility].  Pt. A&O x4, VSS, on RA, with insulin pump running. Left room with all bags of belongings including home CPAP machine and glasses. No care concerns at this time.

## 2018-01-19 NOTE — Progress Notes (Signed)
Patient has been transitioned from insulin infusion back to insulin pump.  I have placed order for transfer to [facility] floor.  After transfer, PCCM will sign off. Please call if we can be of further assistance    Merton Border, MD PCCM service Mobile [phone] Pager [phone] 01/19/2018 6:26 PM

## 2018-01-20 LAB — BASIC METABOLIC PANEL
Anion gap: 7 (ref 5–15)
CO2: 28 mmol/L (ref 22–32)
Calcium: 9 mg/dL (ref 8.9–10.3)
Chloride: 100 mmol/L — ABNORMAL LOW (ref 101–111)
Creatinine, Ser: 0.7 mg/dL (ref 0.61–1.24)
GFR calc Af Amer: >60 mL/min (ref >60)
GLUCOSE: 115 mg/dL — AB (ref 65–99)
POTASSIUM: 3.7 mmol/L (ref 3.5–5.1)
Sodium: 135 mmol/L (ref 135–145)

## 2018-01-20 LAB — GLUCOSE, CAPILLARY
GLUCOSE-CAPILLARY: 102 mg/dL — AB (ref 65–99)
GLUCOSE-CAPILLARY: 131 mg/dL — AB (ref 65–99)
GLUCOSE-CAPILLARY: 55 mg/dL — AB (ref 65–99)
GLUCOSE-CAPILLARY: 156 mg/dL — AB (ref 65–99)
GLUCOSE-CAPILLARY: 94 mg/dL (ref 65–99)
Glucose-Capillary: 114 mg/dL — ABNORMAL HIGH (ref 65–99)
Glucose-Capillary: 217 mg/dL — ABNORMAL HIGH (ref 65–99)
Glucose-Capillary: 60 mg/dL — ABNORMAL LOW (ref 65–99)

## 2018-01-20 LAB — TRIGLYCERIDES
Triglycerides: 1106 mg/dL — ABNORMAL HIGH (ref <150)
Triglycerides: 1133 mg/dL — ABNORMAL HIGH (ref <150)

## 2018-01-20 MED ORDER — PANCRELIPASE (LIP-PROT-AMYL) 40000-126000 UNITS PO CPEP: 5.0000 | ORAL_CAPSULE | Freq: Three times a day (TID) | ORAL | Status: DC

## 2018-01-20 MED ORDER — PANCRELIPASE (LIP-PROT-AMYL) 12000-38000 UNITS PO CPEP
12000.0000 [IU] | ORAL_CAPSULE | ORAL | Status: DC
  Administered 2018-01-21: 12000 [IU] via ORAL
  Filled 2018-01-20 (×6): qty 1

## 2018-01-20 MED ORDER — OXYCODONE HCL 5 MG PO TABS
5.0000 mg | ORAL_TABLET | Freq: Four times a day (QID) | ORAL | Status: DC | PRN
  Administered 2018-01-20: 5 mg via ORAL
  Filled 2018-01-20: qty 1

## 2018-01-20 MED ORDER — ROSUVASTATIN CALCIUM 10 MG PO TABS
10.0000 mg | ORAL_TABLET | Freq: Every day | ORAL | Status: DC
  Administered 2018-01-20 – 2018-01-21 (×2): 10 mg via ORAL
  Filled 2018-01-20 (×2): qty 1

## 2018-01-20 MED ORDER — PANCRELIPASE (LIP-PROT-AMYL) 36000-114000 UNITS PO CPEP
36000.0000 [IU] | ORAL_CAPSULE | Freq: Three times a day (TID) | ORAL | Status: DC
  Administered 2018-01-21 – 2018-01-22 (×5): 36000 [IU] via ORAL
  Filled 2018-01-20 (×6): qty 1

## 2018-01-20 MED ORDER — ATORVASTATIN CALCIUM 20 MG PO TABS: 40.0000 mg | ORAL_TABLET | Freq: Every day | ORAL | Status: DC

## 2018-01-20 MED ORDER — PANCRELIPASE (LIP-PROT-AMYL) 40000-126000 UNITS PO CPEP
2.0000 | ORAL_CAPSULE | ORAL | Status: DC
  Filled 2018-01-20: qty 2

## 2018-01-20 NOTE — Plan of Care (Signed)
Pt with frequent c/o pain improved by prn oxycodone and dilaudid. Tolerating advance in diet. Insulin pump in place and is controlled by pt.

## 2018-01-20 NOTE — Progress Notes (Addendum)
Inpatient Diabetes Program Recommendations  AACE/ADA: New Consensus Statement on Inpatient Glycemic Control (2015)  Target Ranges:  Prepandial:   less than 140 mg/dL      Peak postprandial:   less than 180 mg/dL (1-2 hours)      Critically ill patients:  140 - 180 mg/dL   Lab Results  Component Value Date   GLUCAP 156 (H) 01/20/2018   HGBA1C 10.3 (H) 01/28/2017    Review of Glycemic ControlResults for Sergio [name], Sergio [name] (MRN [id number]) as of 01/20/2018 12:29  Ref. Range 01/19/2018 11:04 01/19/2018 13:08 01/19/2018 15:01 01/19/2018 17:21 01/19/2018 18:20 01/19/2018 20:17 01/20/2018 00:48 01/20/2018 01:08 01/20/2018 02:05 01/20/2018 02:16 01/20/2018 03:09  Glucose-Capillary Latest Ref Range: 65 - 99 mg/dL 85 80 115 (H) 102 (H) 119 (H) 99 60 (L) 114 (H) 55 (L) 131 (H) 156 (H)   Admit with: Hypertriglyceridemia  History: DM  Followed by Dr. Gabriel Carina (Endocrinologist) for DM control as an outpatient and was last seen on 01/12/18.  Home DM Meds: Insulin Pump Basal rates: 12 am 3.35 units/hr  24-hr basal = 80.4 units Bolus settings: I:C ratio:12 am1:3grams (1 unit covers 3 grams of carbohydrates)                3 pm1:2grams (1 unit covers 2 grams of carbohydrates) SensitivityFactor: 12 am1:15(1 unit drops glucose 15 mg/dl) TargetGlucose100-110mg /dl  Inpatient Diabetes Program Recommendations:   Patient restarted insulin pump yesterday.  He states that he had mild low overnight and he reduced basal settings, however he states blood sugars improved today. Spoke with patient and assessed pump settings. No needs noted today.  Discussed with RN need for blood sugar monitoring at least tid with meals and 3 am.  Orders are in place for monitoring per insulin pump order set.   Thanks,  Adah Perl, RN, BC-ADM Inpatient Diabetes Coordinator Pager [phone] (8a-5p)

## 2018-01-20 NOTE — Progress Notes (Signed)
Pt with insulin pump at 3.35 cont q1hr. fs 60 and given 2 apple juice with sugar, fs up to 114.  Checked 30 min later and fs 55. Given half amp of d50 and notified prime doc on call. Insulin pump manually turned down by pt to 3 pr doctor order and cont to monitor.

## 2018-01-20 NOTE — Progress Notes (Signed)
Pharmacy Electrolyte Monitoring Consult:  Pharmacy consulted to assist in monitoring and replacing electrolytes in this 42 y.o. male admitted on 01/15/2018 with pancreatitis.     Labs:  Sodium (mmol/L)  Date Value  01/20/2018 135  11/04/2014 137   Potassium (mmol/L)  Date Value  01/20/2018 3.7  11/04/2014 3.6   Magnesium (mg/dL)  Date Value  01/17/2018 1.8  03/18/2014 1.9   Phosphorus (mg/dL)  Date Value  10/22/2017 3.7   Calcium (mg/dL)  Date Value  01/20/2018 9.0   Calcium, Total (mg/dL)  Date Value  11/04/2014 8.5   Albumin (g/dL)  Date Value  01/16/2018 4.2  11/01/2014 3.0 (L)    Plan: 4/11 Potassium 70mEq PO x 1. Insulin infusion and D10 have been discontinued.   4/12 K 3.7. The insulin pump is reordered. No further supplement indicated at this time. Will recheck all electrolytes tomorrow with AM labs.   Pharmacy will continue to monitor and adjust per consult.    Laural Benes, Pharm.D., BCPS Clinical Pharmacist 01/20/2018 7:14 AM

## 2018-01-20 NOTE — Progress Notes (Signed)
[facility] at [place] NAME: Sergio [name]    MR#:  [id number]  DATE OF BIRTH:  [birth year]  SUBJECTIVE:   Patient's abdominal pain has improved.  asking for upgrading his food. REVIEW OF SYSTEMS:    Review of Systems  Constitutional: Negative for fever, chills weight loss HENT: Negative for ear pain, nosebleeds, congestion, facial swelling, rhinorrhea, neck pain, neck stiffness and ear discharge.   Respiratory: Negative for cough, shortness of breath, wheezing  Cardiovascular: Negative for chest pain, palpitations and leg swelling.  Gastrointestinal: Negative for heartburn, ++abdominal pain, NO vomiting, diarrhea or consitpation Genitourinary: Negative for dysuria, urgency, frequency, hematuria Musculoskeletal: Negative for back pain or joint pain Neurological: Negative for dizziness, seizures, syncope, focal weakness,  numbness and headaches.  Hematological: Does not bruise/bleed easily.  Psychiatric/Behavioral: Negative for hallucinations, confusion, dysphoric mood  Tolerating Diet: yes   DRUG ALLERGIES:   Allergies  Allergen Reactions  . Codeine Anaphylaxis  . Ivp Dye [Iodinated Diagnostic Agents] Other (See Comments)    Kidneys stop working    VITALS:  Blood pressure (!) 145/92, pulse 73, temperature 97.7 F (36.5 C), temperature source Oral, resp. rate 16, height 5\' 11"  (1.803 m), weight 120.1 kg (264 lb 12.8 oz), SpO2 98 %.  PHYSICAL EXAMINATION:  Constitutional: Appears well-developed and well-nourished. No distress. HENT: Normocephalic. [name] Oropharynx is clear and moist.  Eyes: Conjunctivae and EOM are normal. PERRLA, no scleral icterus.  Neck: Normal ROM. Neck supple. No JVD. No tracheal deviation. CVS: RRR, S1/S2 +, no murmurs, no gallops, no carotid bruit.  Pulmonary: Effort and breath sounds normal, no stridor, rhonchi, wheezes, rales.  Abdominal: Soft. BS +, generalized epigastric tenderness without rebound or  guarding Musculoskeletal: Normal range of motion. No edema and no tenderness.  Neuro: Alert. CN 2-12 grossly intact. No focal deficits. Skin: Skin is warm and dry. No rash noted. Psychiatric: Normal mood and affect.    LABORATORY PANEL:   CBC Recent Labs  Lab 01/16/18 0316  WBC 3.8  HGB 14.9  HCT 42.1  PLT 164   ------------------------------------------------------------------------------------------------------------------  Chemistries  Recent Labs  Lab 01/16/18 0316  01/17/18 1707  01/20/18 0440  NA 131*   < > 131*   < > 135  K 3.5   < > 3.8   < > 3.7  CL 97*   < > 93*   < > 100*  CO2 21*   < > 28   < > 28  GLUCOSE 120*   < > 144*   < > 115*  BUN 11   < > 5*   < > <5*  CREATININE 0.81   < > 0.80   < > 0.70  CALCIUM 8.4*   < > 8.8*   < > 9.0  MG  --    < > 1.8  --   --   AST 31  --   --   --   --   ALT 28  --   --   --   --   ALKPHOS 48  --   --   --   --   BILITOT 1.4*  --   --   --   --    < > = values in this interval not displayed.   ------------------------------------------------------------------------------------------------------------------  Cardiac Enzymes No results for input(s): TROPONINI in the last 168 hours. ------------------------------------------------------------------------------------------------------------------  RADIOLOGY:  No results found.   ASSESSMENT AND PLAN:   42 year old male with history of  diabetes and hypertriglyceridemia who presents with abdominal pain.  1.  Acute on chronic pancreatitis due to elevated triglyceride level  given insulin drip and dextrose drip , triglyceride level was less than 1000 so drips were DC'd and patient was started back on his insulin pump and sent to medical floor.  History of triglyceride is slightly higher today, continue monitoring.  2.  Diabetes: given insulin drip as per recommendations by Dr. Gabriel Carina   Now on insulin pump again.  3.  Hypertriglyceridemia: Continue gemfibrozil Follow  triglyceride level daily. Restarted on rosuvastatin.  4.  Hypokalemia: Replete as needed 5.  Hyponatremia from elevated blood sugars  6.  Depression/ anxiety: Continue Zoloft  7.  BPH: Continue Flomax and finasteride 8.  Sleep apnea: Continue CPAP at night   Management plans discussed with the patient and he is in agreement.  CODE STATUS: FULL  TOTAL TIME TAKING CARE OF THIS PATIENT: 34 minutes.    POSSIBLE D/C 3-5 days, DEPENDING ON CLINICAL CONDITION.   Vaughan Basta M.D on 01/20/2018 at 5:43 PM  Between 7am to 6pm - Pager - [phone] After 6pm go to [email or web address] - password [email or web address] [facility]  Office  [phone]  CC: Primary care physician; Crecencio Mc, MD  Note: This dictation was prepared with Dragon dictation along with smaller phrase technology. Any transcriptional errors that result from this process are unintentional.

## 2018-01-21 LAB — BASIC METABOLIC PANEL
Anion gap: 8 (ref 5–15)
BUN: 6 mg/dL (ref 6–20)
CO2: 29 mmol/L (ref 22–32)
CREATININE: 0.78 mg/dL (ref 0.61–1.24)
Calcium: 8.9 mg/dL (ref 8.9–10.3)
Chloride: 98 mmol/L — ABNORMAL LOW (ref 101–111)
GFR calc Af Amer: >60 mL/min (ref >60)
GFR calc non Af Amer: >60 mL/min (ref >60)
GLUCOSE: 110 mg/dL — AB (ref 65–99)
POTASSIUM: 3.5 mmol/L (ref 3.5–5.1)
SODIUM: 135 mmol/L (ref 135–145)

## 2018-01-21 LAB — GLUCOSE, CAPILLARY
GLUCOSE-CAPILLARY: 116 mg/dL — AB (ref 65–99)
GLUCOSE-CAPILLARY: 149 mg/dL — AB (ref 65–99)
Glucose-Capillary: 125 mg/dL — ABNORMAL HIGH (ref 65–99)
Glucose-Capillary: 151 mg/dL — ABNORMAL HIGH (ref 65–99)
Glucose-Capillary: 163 mg/dL — ABNORMAL HIGH (ref 65–99)
Glucose-Capillary: 98 mg/dL (ref 65–99)

## 2018-01-21 LAB — PHOSPHORUS: Phosphorus: 5.1 mg/dL — ABNORMAL HIGH (ref 2.5–4.6)

## 2018-01-21 LAB — TRIGLYCERIDES
TRIGLYCERIDES: 1382 mg/dL — AB (ref <150)
Triglycerides: 1254 mg/dL — ABNORMAL HIGH (ref <150)

## 2018-01-21 LAB — MAGNESIUM: Magnesium: 1.8 mg/dL (ref 1.7–2.4)

## 2018-01-21 LAB — LIPASE, BLOOD: Lipase: 21 U/L (ref 11–51)

## 2018-01-21 MED ORDER — OXYCODONE HCL 5 MG PO TABS
5.0000 mg | ORAL_TABLET | ORAL | Status: DC | PRN
  Administered 2018-01-21 – 2018-01-22 (×6): 10 mg via ORAL
  Filled 2018-01-21 (×6): qty 2

## 2018-01-21 MED ORDER — TAMSULOSIN HCL 0.4 MG PO CAPS
0.8000 mg | ORAL_CAPSULE | Freq: Every day | ORAL | Status: DC
  Administered 2018-01-21: 0.8 mg via ORAL
  Filled 2018-01-21 (×3): qty 2

## 2018-01-21 NOTE — Progress Notes (Signed)
[facility] at [place] NAME: Sergio [name]    MR#:  [id number]  DATE OF BIRTH:  1976/06/27  SUBJECTIVE:   Patient's abdominal pain has improved.  Tolerating soft diet.  REVIEW OF SYSTEMS:    Review of Systems  Constitutional: Negative for fever, chills weight loss HENT: Negative for ear pain, nosebleeds, congestion, facial swelling, rhinorrhea, neck pain, neck stiffness and ear discharge.   Respiratory: Negative for cough, shortness of breath, wheezing  Cardiovascular: Negative for chest pain, palpitations and leg swelling.  Gastrointestinal: Negative for heartburn, minimal abdominal pain, NO vomiting, diarrhea or consitpation Genitourinary: Negative for dysuria, urgency, frequency, hematuria Musculoskeletal: Negative for back pain or joint pain Neurological: Negative for dizziness, seizures, syncope, focal weakness,  numbness and headaches.  Hematological: Does not bruise/bleed easily.  Psychiatric/Behavioral: Negative for hallucinations, confusion, dysphoric mood  Tolerating Diet: yes   DRUG ALLERGIES:   Allergies  Allergen Reactions  . Codeine Anaphylaxis  . Ivp Dye [Iodinated Diagnostic Agents] Other (See Comments)    Kidneys stop working    VITALS:  Blood pressure 106/64, pulse 67, temperature (!) 97.5 F (36.4 C), temperature source Oral, resp. rate 13, height 5\' 11"  (1.803 m), weight 120.1 kg (264 lb 12.8 oz), SpO2 95 %.  PHYSICAL EXAMINATION:  Constitutional: Appears well-developed and well-nourished. No distress. HENT: Normocephalic. [name] Oropharynx is clear and moist.  Eyes: Conjunctivae and EOM are normal. PERRLA, no scleral icterus.  Neck: Normal ROM. Neck supple. No JVD. No tracheal deviation. CVS: RRR, S1/S2 +, no murmurs, no gallops, no carotid bruit.  Pulmonary: Effort and breath sounds normal, no stridor, rhonchi, wheezes, rales.  Abdominal: Soft. BS +, generalized epigastric tenderness without rebound or  guarding Musculoskeletal: Normal range of motion. No edema and no tenderness.  Neuro: Alert. CN 2-12 grossly intact. No focal deficits. Skin: Skin is warm and dry. No rash noted. Psychiatric: Normal mood and affect.    LABORATORY PANEL:   CBC Recent Labs  Lab 01/16/18 0316  WBC 3.8  HGB 14.9  HCT 42.1  PLT 164   ------------------------------------------------------------------------------------------------------------------  Chemistries  Recent Labs  Lab 01/16/18 0316  01/21/18 0451  NA 131*   < > 135  K 3.5   < > 3.5  CL 97*   < > 98*  CO2 21*   < > 29  GLUCOSE 120*   < > 110*  BUN 11   < > 6  CREATININE 0.81   < > 0.78  CALCIUM 8.4*   < > 8.9  MG  --    < > 1.8  AST 31  --   --   ALT 28  --   --   ALKPHOS 48  --   --   BILITOT 1.4*  --   --    < > = values in this interval not displayed.   ------------------------------------------------------------------------------------------------------------------  Cardiac Enzymes No results for input(s): TROPONINI in the last 168 hours. ------------------------------------------------------------------------------------------------------------------  RADIOLOGY:  No results found.   ASSESSMENT AND PLAN:   42 year old male with history of diabetes and hypertriglyceridemia who presents with abdominal pain.  1.  Acute on chronic pancreatitis due to elevated triglyceride level  given insulin drip and dextrose drip , triglyceride level was less than 1000 so drips were DC'd and patient was started back on his insulin pump and sent to medical floor.  triglyceride is slightly higher today, continue monitoring.  Patient have minimal abdominal pain and lipase is normal.  2.  Diabetes: given insulin drip as per recommendations by Dr. Gabriel Carina   Now on insulin pump again.  3.  Hypertriglyceridemia: Continue gemfibrozil Follow triglyceride level daily. Restarted on rosuvastatin.  Triglyceride level is gradually rising for  last 2 days, still not dangerously high, so at this time I would not start on insulin IV drip but continue to monitor with oral medications and follow-up.  4.  Hypokalemia: Replete as needed 5.  Hyponatremia from elevated blood sugars  6.  Depression/ anxiety: Continue Zoloft  7.  BPH: Continue Flomax and finasteride, increased Flomax dose as we are going to discontinue Foley catheter today.  8.  Sleep apnea: Continue CPAP at night   Management plans discussed with the patient and he is in agreement.  CODE STATUS: FULL  TOTAL TIME TAKING CARE OF THIS PATIENT: 34 minutes.    POSSIBLE D/C 2-3 days, DEPENDING ON CLINICAL CONDITION.   Vaughan Basta M.D on 01/21/2018 at 12:36 PM  Between 7am to 6pm - Pager - [phone] After 6pm go to [email or web address] - password [email or web address] [facility]  Office  [phone]  CC: Primary care physician; Crecencio Mc, MD  Note: This dictation was prepared with Dragon dictation along with smaller phrase technology. Any transcriptional errors that result from this process are unintentional.

## 2018-01-22 LAB — COMPREHENSIVE METABOLIC PANEL
ALBUMIN: 3.9 g/dL (ref 3.5–5.0)
ALT: 16 U/L — ABNORMAL LOW (ref 17–63)
ANION GAP: 7 (ref 5–15)
AST: 20 U/L (ref 15–41)
Alkaline Phosphatase: 52 U/L (ref 38–126)
BUN: 14 mg/dL (ref 6–20)
CHLORIDE: 100 mmol/L — AB (ref 101–111)
CO2: 29 mmol/L (ref 22–32)
Calcium: 8.9 mg/dL (ref 8.9–10.3)
Creatinine, Ser: 0.99 mg/dL (ref 0.61–1.24)
GFR calc Af Amer: >60 mL/min (ref >60)
GFR calc non Af Amer: >60 mL/min (ref >60)
GLUCOSE: 108 mg/dL — AB (ref 65–99)
POTASSIUM: 3.7 mmol/L (ref 3.5–5.1)
SODIUM: 136 mmol/L (ref 135–145)
Total Bilirubin: 0.8 mg/dL (ref 0.3–1.2)
Total Protein: 7.2 g/dL (ref 6.5–8.1)

## 2018-01-22 LAB — LIPASE, BLOOD: Lipase: 31 U/L (ref 11–51)

## 2018-01-22 LAB — GLUCOSE, CAPILLARY
GLUCOSE-CAPILLARY: 164 mg/dL — AB (ref 65–99)
GLUCOSE-CAPILLARY: 114 mg/dL — AB (ref 65–99)
GLUCOSE-CAPILLARY: 94 mg/dL (ref 65–99)
GLUCOSE-CAPILLARY: 124 mg/dL — AB (ref 65–99)

## 2018-01-22 LAB — TRIGLYCERIDES: TRIGLYCERIDES: 1068 mg/dL — AB (ref <150)

## 2018-01-22 MED ORDER — OXYCODONE HCL 5 MG PO TABS: 5.0000 mg | ORAL_TABLET | Freq: Four times a day (QID) | ORAL | 0 refills | Status: DC | PRN

## 2018-01-22 NOTE — Discharge Summary (Signed)
[name] NAME: Sergio [name]    MR#:  [id number]  DATE OF BIRTH:  December 07, 1975  DATE OF ADMISSION:  01/15/2018 ADMITTING PHYSICIAN: Bettey Costa, MD  DATE OF DISCHARGE: 01/22/2018  PRIMARY CARE PHYSICIAN: Crecencio Mc, MD    ADMISSION DIAGNOSIS:  Hypertriglyceridemia [E78.1] Intractable abdominal pain [R10.9] Other acute pancreatitis without infection or necrosis [K85.80] Intractable vomiting with nausea, unspecified vomiting type [R11.2]  DISCHARGE DIAGNOSIS:  acute on chronic pancreatitis secondary to hypertriglyceridemia diabetes on insulin pump  SECONDARY DIAGNOSIS:   Past Medical History:  Diagnosis Date  . Anxiety   . Chicken pox   . Depression   . Diabetes mellitus    DM2  . Familial hypertriglyceridemia    severe  . Gastric ulcer 2009  . HTN (hypertension)   . Morbid obesity ([place])    s/p bariatric sleeve surgery 01/2015  . Recurrent acute pancreatitis    secondary to hypertriglyceridemia   . Sleep apnea 1999   uses CPAP    HOSPITAL COURSE:   42 year old male with history of diabetes and hypertriglyceridemia who presents with abdominal pain.  1.  Acute on chronic pancreatitis due to elevated triglyceride level -patient received IV  insulin drip and dextrose drip , triglyceride level was less than 1000 so drips were DC'd and patient was started back on his insulin pump  -triglyceride today 1032 -patient has mild abdominal pain tolerated with oxycodone. He is eating hundred percent of his meals. No nausea vomiting.  2.  Diabetes-2  -received IV insulin drip as per recommendations by Dr. Gabriel Carina   Now on insulin pump again. Sugar stable. Patient will follow up with endocrinology as outpatient  3.  Hypertriglyceridemia: Continue gemfibrozil, Crestor and omega fatty acid  4.  Hypokalemia: Replete as needed  5.  Hyponatremia from elevated blood sugars  6.  Depression/ anxiety: Continue  Zoloft  7.  BPH: Continue Flomax and finasteride  8.  Sleep apnea: Continue CPAP at night   overall it seems to be improving. Levels trending down slowly. Patient is able to eat all the meals. He is okay to go home follow-up with outpatient with endocrinology.  CONSULTS OBTAINED:    DRUG ALLERGIES:   Allergies  Allergen Reactions  . Codeine Anaphylaxis  . Ivp Dye [Iodinated Diagnostic Agents] Other (See Comments)    Kidneys stop working    DISCHARGE MEDICATIONS:   Allergies as of 01/22/2018      Reactions   Codeine Anaphylaxis   Ivp Dye [iodinated Diagnostic Agents] Other (See Comments)   Kidneys stop working      Medication List    STOP taking these medications   amoxicillin-clavulanate 875-125 MG tablet Commonly known as:  AUGMENTIN   mupirocin ointment 2 % Commonly known as:  BACTROBAN     TAKE these medications   ALPRAZolam 0.5 MG tablet Commonly known as:  XANAX Take 0.5 mg by mouth 2 (two) times daily as needed for anxiety.   finasteride 5 MG tablet Commonly known as:  PROSCAR Take 1 tablet (5 mg total) by mouth daily. What changed:  when to take this   gemfibrozil 600 MG tablet Commonly known as:  LOPID Take 1 tablet (600 mg total) by mouth 2 (two) times daily before a meal.   NOVOLOG 100 UNIT/ML injection Generic drug:  insulin aspart Inject 1 Dose into the skin See admin instructions. Use up to 120 units daily via insulin pump   omega-3 acid ethyl  esters 1 g capsule Commonly known as:  LOVAZA Take 2 capsules (2 g total) by mouth 2 (two) times daily. What changed:  how much to take   ondansetron 4 MG disintegrating tablet Commonly known as:  ZOFRAN ODT Take 1 tablet (4 mg total) by mouth every 8 (eight) hours as needed for nausea or vomiting.   oxyCODONE 5 MG immediate release tablet Commonly known as:  Oxy IR/ROXICODONE Take 1-2 tablets (5-10 mg total) by mouth every 6 (six) hours as needed for severe pain.   Pancrelipase  (Lip-Prot-Amyl) 40000-126000 units Cpep Commonly known as:  ZENPEP Take 1 capsule by mouth 5 (five) times daily. Before meals and  snacks What changed:  additional instructions   pregabalin 300 MG capsule Commonly known as:  LYRICA Take 300 mg by mouth at bedtime.   rosuvastatin 10 MG tablet Commonly known as:  CRESTOR Take 1 tablet (10 mg total) by mouth daily at 6 PM. What changed:  when to take this   sertraline 100 MG tablet Commonly known as:  ZOLOFT Take 1 tablet (100 mg total) by mouth daily.   tamsulosin 0.4 MG Caps capsule Commonly known as:  FLOMAX Take 1 capsule (0.4 mg total) by mouth daily. What changed:  when to take this   traZODone 100 MG tablet Commonly known as:  DESYREL Take 2 tablets (200 mg total) by mouth daily as needed for sleep. What changed:    how much to take  when to take this       If you experience worsening of your admission symptoms, develop shortness of breath, life threatening emergency, suicidal or homicidal thoughts you must seek medical attention immediately by calling 911 or calling your MD immediately  if symptoms less severe.  You Must read complete instructions/literature along with all the possible adverse reactions/side effects for all the Medicines you take and that have been prescribed to you. Take any new Medicines after you have completely understood and accept all the possible adverse reactions/side effects.   Please note  You were cared for by a hospitalist during your hospital stay. If you have any questions about your discharge medications or the care you received while you were in the hospital after you are discharged, you can call the unit and asked to speak with the hospitalist on call if the hospitalist that took care of you is not available. Once you are discharged, your primary care physician will handle any further medical issues. Please note that NO REFILLS for any discharge medications will be authorized once you  are discharged, as it is imperative that you return to your primary care physician (or establish a relationship with a primary care physician if you do not have one) for your aftercare needs so that they can reassess your need for medications and monitor your lab values. Today   SUBJECTIVE   Mild abdominal pain. No nausea or vomiting. Tolerating diet well.  VITAL SIGNS:  Blood pressure 116/80, pulse 63, temperature 97.7 F (36.5 C), temperature source Oral, resp. rate 17, height 5\' 11"  (1.803 m), weight 120.1 kg (264 lb 12.8 oz), SpO2 96 %.  I/O:    Intake/Output Summary (Last 24 hours) at 01/22/2018 1225 Last data filed at 01/22/2018 1046 Gross per 24 hour  Intake 720 ml  Output 1550 ml  Net -830 ml    PHYSICAL EXAMINATION:  GENERAL:  42 y.o.-year-old patient lying in the bed with no acute distress.  EYES: Pupils equal, round, reactive to light and accommodation.  No scleral icterus. Extraocular muscles intact.  HEENT: Head atraumatic, normocephalic. Oropharynx and nasopharynx clear.  NECK:  Supple, no jugular venous distention. No thyroid enlargement, no tenderness.  LUNGS: Normal breath sounds bilaterally, no wheezing, rales,rhonchi or crepitation. No use of accessory muscles of respiration.  CARDIOVASCULAR: S1, S2 normal. No murmurs, rubs, or gallops.  ABDOMEN: Soft, non-tender, non-distended. Bowel sounds present. No organomegaly or mass.  EXTREMITIES: No pedal edema, cyanosis, or clubbing.  NEUROLOGIC: Cranial nerves II through XII are intact. Muscle strength 5/5 in all extremities. Sensation intact. Gait not checked.  PSYCHIATRIC: The patient is alert and oriented x 3.  SKIN: No obvious rash, lesion, or ulcer.   DATA REVIEW:   CBC  Recent Labs  Lab 01/16/18 0316  WBC 3.8  HGB 14.9  HCT 42.1  PLT 164    Chemistries  Recent Labs  Lab 01/21/18 0451 01/22/18 0501  NA 135 136  K 3.5 3.7  CL 98* 100*  CO2 29 29  GLUCOSE 110* 108*  BUN 6 14  CREATININE 0.78 0.99   CALCIUM 8.9 8.9  MG 1.8  --   AST  --  20  ALT  --  16*  ALKPHOS  --  52  BILITOT  --  0.8    Microbiology Results   Recent Results (from the past 240 hour(s))  MRSA PCR Screening     Status: None   Collection Time: 01/15/18  9:31 AM  Result Value Ref Range Status   MRSA by PCR NEGATIVE NEGATIVE Final    Comment:        The GeneXpert MRSA Assay (FDA approved for NASAL specimens only), is one component of a comprehensive MRSA colonization surveillance program. It is not intended to diagnose MRSA infection nor to guide or monitor treatment for MRSA infections. Performed at [facility], [address], [place], [place] [address]     RADIOLOGY:  No results found.   Management plans discussed with the patient, family and they are in agreement.  CODE STATUS:     Code Status Orders  (From admission, onward)        Start     Ordered   01/15/18 0927  Full code  Continuous     01/15/18 0926    Code Status History    Date Active Date Inactive Code Status Order ID Comments User Context   10/16/2017 0753 10/23/2017 1931 Full Code [id number]  Saundra Shelling, MD Inpatient   05/13/2017 1524 05/23/2017 2006 Full Code [id number]  [name], MD Inpatient   01/28/2017 0840 02/03/2017 1543 Full Code [id number]  Harrie Foreman, MD Inpatient   07/24/2016 2251 08/04/2016 1837 Full Code [id number]  Theodoro Grist, MD Inpatient   03/11/2016 0458 03/19/2016 1644 Full Code [id number]  Harrie Foreman, MD Inpatient   01/17/2016 0250 01/27/2016 1747 Full Code [id number]  Harrie Foreman, MD Inpatient   08/01/2015 2335 08/15/2015 1643 Full Code [id number]  Lytle Butte, MD ED   07/21/2014 1146 07/27/2014 1816 Full Code [id number]  Oswald Hillock, MD Inpatient      TOTAL TIME TAKING CARE OF THIS PATIENT: *40* minutes.    Fritzi Mandes M.D on 01/22/2018 at 12:25 PM  Between 7am to 6pm - Pager - [phone] After 6pm go to [email or web address] - password [email or web address]  Office  [phone]  CC: Primary care physician; Crecencio Mc, MD

## 2018-01-22 NOTE — Progress Notes (Signed)
Discharge order received. Patient is alert and oriented. Vital signs stable . No signs of acute distress. Discharge instructions given. Patient verbalized understanding. No other issues noted at this time.   

## 2018-04-06 ENCOUNTER — Encounter: Payer: Self-pay | Admitting: Internal Medicine

## 2018-04-07 ENCOUNTER — Ambulatory Visit (INDEPENDENT_AMBULATORY_CARE_PROVIDER_SITE_OTHER): Payer: BLUE CROSS/BLUE SHIELD | Admitting: Internal Medicine

## 2018-04-07 ENCOUNTER — Encounter: Payer: Self-pay | Admitting: Internal Medicine

## 2018-04-07 VITALS — BP 146/90 | HR 94 | Ht 71.0 in | Wt 249.0 lb

## 2018-04-07 DIAGNOSIS — G4733 Obstructive sleep apnea (adult) (pediatric): Secondary | ICD-10-CM

## 2018-04-07 NOTE — Patient Instructions (Signed)
Will send for sleep study.  Sleep with HST for at least 4 hours (without CPAP), then turn off the HST and put on your CPAP.

## 2018-04-07 NOTE — Progress Notes (Signed)
[facility] Consultation      Assessment and Plan:  Severe obstructive sleep apnea, currently on CPAP now with recurrent symptoms of excessive daytime sleepiness and loud snoring with witnessed apneas. - We will send for new sleep study. - Patient is currently on CPAP with low residual AHI, however he may need adjustment from his current CPAP settings of 10-18 to higher setting of 14-20.  Essential hypertension, diabetes mellitus, obesity - Sleep apnea can contribute to above conditions, therefore treatment of sleep apnea is important part of their management.  Chronic pancreatitis. - Recurrent.  Orders Placed This Encounter  Procedures  . Home sleep test     Date: 04/07/2018  MRN# [id number] Sergio [name] April 30, 1976    Sergio [name] is a 42 y.o. old male seen in consultation for chief complaint of:    Chief Complaint  Patient presents with  . Consult    ref by Derrel Nip: Pt wears CPAP nightly and with naps: feels tired even with CPAP:     HPI:   He was diagnosed with OSA about 20 years ago, he is on his second machine. He is noticing that he is tired again after a full nights sleep, he is sleepy during the day. He is sleeping about 9 hours per night, he is wearing his cpap every night and has trouble sleeping without it.   Before his diagnosis he would fall asleep driving, he thinks his AHI was originally more than 100. He thinks his machine is an auto-PAP. He had UPPP surgery about 16 years ago.  He does fall asleep during the day, he will snore loudly.   **Download data 03/08/2018-04/06/2018 uses greater than 4 hours 100%.  Average usage on days used is 9 hours 7 minutes.  Pressure setting 10-18.  Median pressure 11, 93rd percentile 14, maximum pressure 16.  Residual AHI 2.6.  Overall this test shows excellent compliance with excellent control of obstructive sleep apnea.  PMHX:   Past Medical History:  Diagnosis Date  . Anxiety   . Chicken pox   .  Depression   . Diabetes mellitus    DM2  . Familial hypertriglyceridemia    severe  . Gastric ulcer 2009  . HTN (hypertension)   . Morbid obesity ([place])    s/p bariatric sleeve surgery 01/2015  . Recurrent acute pancreatitis    secondary to hypertriglyceridemia   . Sleep apnea 1999   uses CPAP   Surgical Hx:  Past Surgical History:  Procedure Laterality Date  . CHOLECYSTECTOMY    . LAPAROSCOPIC GASTRIC SLEEVE RESECTION    . [place] SURGERY  2008  . TONSILLECTOMY  2003  . VASECTOMY     Family Hx:  Family History  Adopted: Yes  Problem Relation Age of Onset  . Hypertension Mother   . Hyperlipidemia Mother   . Hypertension Father   . Hyperlipidemia Father    Social Hx:   Social History   Tobacco Use  . Smoking status: Never Smoker  . Smokeless tobacco: Never Used  Substance Use Topics  . Alcohol use: No    Frequency: Never  . Drug use: No   Medication:    Current Outpatient Medications:  .  ALPRAZolam (XANAX) 0.5 MG tablet, Take 0.5 mg by mouth 2 (two) times daily as needed for anxiety. , Disp: , Rfl:  .  finasteride (PROSCAR) 5 MG tablet, Take 1 tablet (5 mg total) by mouth daily. (Patient taking differently: Take 5 mg by mouth  at bedtime. ), Disp: 30 tablet, Rfl: 0 .  gemfibrozil (LOPID) 600 MG tablet, Take 1 tablet (600 mg total) by mouth 2 (two) times daily before a meal., Disp: 60 tablet, Rfl: 0 .  insulin aspart (NOVOLOG) 100 UNIT/ML injection, Inject 1 Dose into the skin See admin instructions. Use up to 120 units daily via insulin pump, Disp: , Rfl:  .  omega-3 acid ethyl esters (LOVAZA) 1 g capsule, Take 2 capsules (2 g total) by mouth 2 (two) times daily. (Patient taking differently: Take 3 g by mouth 2 (two) times daily. ), Disp: 60 capsule, Rfl: 0 .  Pancrelipase, Lip-Prot-Amyl, (ZENPEP) 40000-126000 units CPEP, Take 1 capsule by mouth 5 (five) times daily. Before meals and  snacks (Patient taking differently: Take 1 capsule by mouth 5 (five) times daily.  5 Capsule Before meals and 2 capsules with snacks), Disp: 150 capsule, Rfl: 2 .  pregabalin (LYRICA) 300 MG capsule, Take 300 mg by mouth at bedtime. , Disp: , Rfl:  .  rosuvastatin (CRESTOR) 10 MG tablet, Take 1 tablet (10 mg total) by mouth daily at 6 PM. (Patient taking differently: Take 10 mg by mouth at bedtime. ), Disp: 30 tablet, Rfl: 0 .  sertraline (ZOLOFT) 100 MG tablet, Take 1 tablet (100 mg total) by mouth daily., Disp: 90 tablet, Rfl: 1 .  tamsulosin (FLOMAX) 0.4 MG CAPS capsule, Take 1 capsule (0.4 mg total) by mouth daily. (Patient taking differently: Take 0.4 mg by mouth at bedtime. ), Disp: 30 capsule, Rfl: 0 .  traZODone (DESYREL) 100 MG tablet, Take 2 tablets (200 mg total) by mouth daily as needed for sleep. (Patient taking differently: Take 100-200 mg by mouth at bedtime as needed for sleep. ), Disp: 180 tablet, Rfl: 1   Allergies:  Codeine and Iodinated diagnostic agents  Review of Systems: Gen:  Denies  fever, sweats, chills HEENT: Denies blurred vision, double vision. bleeds, sore throat Cvc:  No dizziness, chest pain. Resp:   Denies cough or sputum production, shortness of breath Gi: Denies swallowing difficulty, stomach pain. Gu:  Denies bladder incontinence, burning urine Ext:   No Joint pain, stiffness. Skin: No skin rash,  hives  Endoc:  No polyuria, polydipsia. Psych: No depression, insomnia. Other:  All other systems were reviewed with the patient and were negative other that what is mentioned in the HPI.   Physical Examination:   VS: BP (!) 146/90 (BP Location: Left Arm, Cuff Size: Normal)   Pulse 94   Ht 5\' 11"  (1.803 m)   Wt 249 lb (112.9 kg)   SpO2 96%   BMI 34.73 kg/m   General Appearance: No distress  Neuro:without focal findings,  speech normal,  HEENT: PERRLA, EOM intact.  Mallampati 4 Pulmonary: normal breath sounds, No wheezing.  CardiovascularNormal S1,S2.  No m/r/g.   Abdomen: Benign, Soft, non-tender. Renal:  No costovertebral  tenderness  GU:  No performed at this time. Endoc: No evident thyromegaly, no signs of acromegaly. Skin:   warm, no rashes, no ecchymosis  Extremities: normal, no cyanosis, clubbing.  Other findings:    LABORATORY PANEL:   CBC No results for input(s): WBC, HGB, HCT, PLT in the last 168 hours. ------------------------------------------------------------------------------------------------------------------  Chemistries  No results for input(s): NA, K, CL, CO2, GLUCOSE, BUN, CREATININE, CALCIUM, MG, AST, ALT, ALKPHOS, BILITOT in the last 168 hours.  Invalid input(s): GFRCGP ------------------------------------------------------------------------------------------------------------------  Cardiac Enzymes No results for input(s): TROPONINI in the last 168 hours. ------------------------------------------------------------  RADIOLOGY:  No results found.  Thank  you for the consultation and for allowing [facility] to assist in the care of your patient. Our recommendations are noted above.  Please contact us if we can be of further service.   Marda Stalker, M.D., F.C.C.P.  Board Certified in Internal Medicine, Pulmonary Medicine, [facility], and Sleep Medicine.  [facility] Office Number: [phone]   04/07/2018

## 2018-04-12 LAB — HEPATIC FUNCTION PANEL
ALT: 10 (ref 10–40)
AST: 15 (ref 14–40)
Alkaline Phosphatase: 43 (ref 25–125)
BILIRUBIN, TOTAL: 0.5

## 2018-04-12 LAB — LIPID PANEL
Cholesterol: 326 — AB (ref 0–200)
HDL: 20 — AB (ref 35–70)
Triglycerides: 999 — AB (ref 40–160)

## 2018-04-12 LAB — HEMOGLOBIN A1C: HEMOGLOBIN A1C: 8.2 — AB (ref 4.0–6.0)

## 2018-04-12 LAB — BASIC METABOLIC PANEL
BUN: 15 (ref 4–21)
Creatinine: 1.3 (ref 0.6–1.3)
POTASSIUM: 3.6 (ref 3.4–5.3)
Sodium: 129 — AB (ref 137–147)

## 2018-05-03 ENCOUNTER — Encounter: Payer: Self-pay | Admitting: Internal Medicine

## 2018-05-03 DIAGNOSIS — G4733 Obstructive sleep apnea (adult) (pediatric): Secondary | ICD-10-CM

## 2018-05-05 ENCOUNTER — Telehealth: Payer: Self-pay | Admitting: *Deleted

## 2018-05-05 DIAGNOSIS — G4733 Obstructive sleep apnea (adult) (pediatric): Secondary | ICD-10-CM

## 2018-05-05 NOTE — Telephone Encounter (Signed)
Pt informed he has moderate sleep apnea. AHI of 25. CPAP auto setting 10-20 cm H2O. Order placed. Nothing further needed.

## 2018-05-19 ENCOUNTER — Telehealth: Payer: Self-pay | Admitting: Internal Medicine

## 2018-05-19 NOTE — Telephone Encounter (Signed)
Spoke with pt and schedule CPAP compliance appt. Nothing further needed.

## 2018-05-19 NOTE — Telephone Encounter (Signed)
Patient calling Would like to confirm with someone about when to make his CPAP compliance appointment Received machine on 8/6 but wasn't sure if he has to come after 30 within 90 days or before 30 days Please call to discuss

## 2018-05-29 ENCOUNTER — Ambulatory Visit: Payer: Self-pay | Admitting: Internal Medicine

## 2018-05-31 ENCOUNTER — Encounter: Payer: Self-pay | Admitting: Internal Medicine

## 2018-05-31 ENCOUNTER — Ambulatory Visit (INDEPENDENT_AMBULATORY_CARE_PROVIDER_SITE_OTHER): Payer: BLUE CROSS/BLUE SHIELD | Admitting: Internal Medicine

## 2018-05-31 VITALS — BP 124/86 | HR 94 | Temp 98.7°F | Resp 15 | Ht 71.0 in | Wt 242.6 lb

## 2018-05-31 DIAGNOSIS — F411 Generalized anxiety disorder: Secondary | ICD-10-CM | POA: Diagnosis not present

## 2018-05-31 DIAGNOSIS — E781 Pure hyperglyceridemia: Secondary | ICD-10-CM

## 2018-05-31 DIAGNOSIS — E1165 Type 2 diabetes mellitus with hyperglycemia: Secondary | ICD-10-CM

## 2018-05-31 DIAGNOSIS — E1149 Type 2 diabetes mellitus with other diabetic neurological complication: Secondary | ICD-10-CM

## 2018-05-31 DIAGNOSIS — Z9884 Bariatric surgery status: Secondary | ICD-10-CM

## 2018-05-31 DIAGNOSIS — I10 Essential (primary) hypertension: Secondary | ICD-10-CM

## 2018-05-31 DIAGNOSIS — IMO0002 Reserved for concepts with insufficient information to code with codable children: Secondary | ICD-10-CM

## 2018-05-31 MED ORDER — ALPRAZOLAM 0.5 MG PO TABS: 0.5000 mg | ORAL_TABLET | Freq: Two times a day (BID) | ORAL | 1 refills | Status: DC | PRN

## 2018-05-31 MED ORDER — DICLOFENAC SODIUM 75 MG PO TBEC: 75.0000 mg | DELAYED_RELEASE_TABLET | Freq: Two times a day (BID) | ORAL | 2 refills | Status: DC

## 2018-05-31 MED ORDER — PANTOPRAZOLE SODIUM 40 MG PO TBEC: 40.0000 mg | DELAYED_RELEASE_TABLET | Freq: Every day | ORAL | 1 refills | Status: DC

## 2018-05-31 NOTE — Patient Instructions (Signed)
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    I am prescribing Diclofenac 75 mg twice daily with food for 4 weeks.  Pantoprazole once daily in the am to protect your stomach   You can add up to 2000 mg of acetominophen (tylenol) every day safely  In divided doses (500 mg every 6 hours  Or 1000 mg every 12 hours.)  Ice the painful area for 15 minutes 4 times daily

## 2018-05-31 NOTE — Progress Notes (Signed)
Subjective:  Patient ID: Sergio [name], male    DOB: September 05, 1976  Age: 42 y.o. MRN: [id number]  CC: The primary encounter diagnosis was DM (diabetes mellitus), type 2, uncontrolled w/neurologic complication ([place]). Diagnoses of Hypertriglyceridemia, Essential hypertension, Generalized anxiety disorder, and S/P bariatric surgery were also pertinent to this visit.  HPI [name] presents for a 6 month follow up on multiple issues including GAD managed with sertraline and rare use of alprazolam ,  Recurrent pancreatitis secondary to uncontrolled hypertriglyceridemia.     He has had No hospitalizations since last visit  . Triglycerides are still high, despite maximal  oral therapy, but he has been able to lower them enough to avoid  recurrent pancreatitis by periodically increasing his insulin  Infusion to rates similar to those used during his hospitalizations.  His endocrinologist has supervised the experimental trial and provided IN infusions of dextrose to prevent hypoglycemia .  The infusions have been repeated monthly x 2 with documented improvement in triglyceride levels.   He has experiencing a Hearing loss   Has n audiology exam scheduled.   New onset right lateral  elbow pain  . Occupational overuse as a handyman   GAD:  Aggravated by family events.  Son started [employer] school, 7th grade , due to recurrent behavioral issues.  Attending school   5 days per week   Outpatient Medications Prior to Visit  Medication Sig Dispense Refill  . finasteride (PROSCAR) 5 MG tablet Take 1 tablet (5 mg total) by mouth daily. (Patient taking differently: Take 5 mg by mouth at bedtime. ) 30 tablet 0  . gemfibrozil (LOPID) 600 MG tablet Take 1 tablet (600 mg total) by mouth 2 (two) times daily before a meal. 60 tablet 0  . insulin aspart (NOVOLOG) 100 UNIT/ML injection Inject 1 Dose into the skin See admin instructions. Use up to 120 units daily via insulin pump    . omega-3 acid ethyl esters  (LOVAZA) 1 g capsule Take 2 capsules (2 g total) by mouth 2 (two) times daily. (Patient taking differently: Take 3 g by mouth 2 (two) times daily. ) 60 capsule 0  . Pancrelipase, Lip-Prot-Amyl, (ZENPEP) 40000-126000 units CPEP Take 1 capsule by mouth 5 (five) times daily. Before meals and  snacks (Patient taking differently: Take 1 capsule by mouth 5 (five) times daily. 5 Capsule Before meals and 2 capsules with snacks) 150 capsule 2  . pregabalin (LYRICA) 300 MG capsule Take 300 mg by mouth at bedtime.     . rosuvastatin (CRESTOR) 10 MG tablet Take 1 tablet (10 mg total) by mouth daily at 6 PM. (Patient taking differently: Take 10 mg by mouth at bedtime. ) 30 tablet 0  . sertraline (ZOLOFT) 100 MG tablet Take 1 tablet (100 mg total) by mouth daily. 90 tablet 1  . tamsulosin (FLOMAX) 0.4 MG CAPS capsule Take 1 capsule (0.4 mg total) by mouth daily. (Patient taking differently: Take 0.4 mg by mouth at bedtime. ) 30 capsule 0  . traZODone (DESYREL) 100 MG tablet Take 2 tablets (200 mg total) by mouth daily as needed for sleep. (Patient taking differently: Take 100-200 mg by mouth at bedtime as needed for sleep. ) 180 tablet 1  . TRULICITY 1.5 TM/1.9QQ SOPN Inject 1.5 mg into the skin once a week.   4  . ALPRAZolam (XANAX) 0.5 MG tablet Take 0.5 mg by mouth 2 (two) times daily as needed for anxiety.      No facility-administered medications prior to  visit.     Review of Systems;  Patient denies headache, fevers, malaise, unintentional weight loss, skin rash, eye pain, sinus congestion and sinus pain, sore throat, dysphagia,  hemoptysis , cough, dyspnea, wheezing, chest pain, palpitations, orthopnea, edema, abdominal pain, nausea, melena, diarrhea, constipation, flank pain, dysuria, hematuria, urinary  Frequency, nocturia, numbness, tingling, seizures,  Focal weakness, Loss of consciousness,  Tremor, insomnia, depression,  and suicidal ideation.      Objective:  BP 124/86 (BP Location: Left Arm,  Patient Position: Sitting, Cuff Size: Normal)   Pulse 94   Temp 98.7 F (37.1 C) (Oral)   Resp 15   Ht 5\' 11"  (1.803 m)   Wt 242 lb 9.6 oz (110 kg)   SpO2 96%   BMI 33.84 kg/m   BP Readings from Last 3 Encounters:  05/31/18 124/86  04/07/18 (!) 146/90  01/22/18 116/80    Wt Readings from Last 3 Encounters:  05/31/18 242 lb 9.6 oz (110 kg)  04/07/18 249 lb (112.9 kg)  01/19/18 264 lb 12.8 oz (120.1 kg)    General appearance: alert, cooperative and appears stated age Ears: normal TM's and external ear canals both ears Throat: lips, mucosa, and tongue normal; teeth and gums normal Neck: no adenopathy, no carotid bruit, supple, symmetrical, trachea midline and thyroid not enlarged, symmetric, no tenderness/mass/nodules Back: symmetric, no curvature. ROM normal. No CVA tenderness. Lungs: clear to auscultation bilaterally Heart: regular rate and rhythm, S1, S2 normal, no murmur, click, rub or gallop Abdomen: soft, non-tender; bowel sounds normal; no masses,  no organomegaly Pulses: 2+ and symmetric Skin: Skin color, texture, turgor normal. No rashes or lesions Lymph nodes: Cervical, supraclavicular, and axillary nodes normal.  Ct Abdomen Pelvis Wo Contrast  Result Date: 01/15/2018 CLINICAL DATA:  42 year old with acute onset of severe UPPER midline abdominal pain which the patient describes as similar to his prior episodes of pancreatitis, though the serum lipase is normal currently. Surgical history includes gastric sleeve bariatric procedure and cholecystectomy. Patient states an allergy to intravenous iodinated contrast. EXAM: CT ABDOMEN AND PELVIS WITHOUT CONTRAST TECHNIQUE: Multidetector CT imaging of the abdomen and pelvis was performed following the standard protocol without IV contrast. COMPARISON:  MRI abdomen 03/21/2014. CT abdomen and pelvis 03/16/2014, 02/15/2013 and earlier. FINDINGS: Lower chest: Stable upper normal heart size. Subpleural fat posteriorly at the RIGHT base.  Visualized lung bases clear. Mild LEFT gynecomastia incidentally noted. Hepatobiliary: Mild-to-moderate hepatomegaly, unchanged since the 2015 CT, with diffuse hepatic steatosis. No focal hepatic parenchymal abnormality. Surgically absent gallbladder. No biliary ductal dilation. Pancreas: Normal unenhanced appearance. Specifically, no peripancreatic inflammation as was present on the 2015 CT. Spleen: Enlarged, measuring approximately 11.7 x 7.0 x 17.1 cm yielding a volume of approximately 700 mL, unchanged since the 2015 CT. No focal splenic parenchymal abnormality. Large accessory splenule medial to the LOWER pole of the spleen. Smaller accessory splenule medial to the spleen at the hilum. Adrenals/Urinary Tract: Normal appearing adrenal glands. No evidence of urinary tract calculi or obstruction on either side. Within the limits of the unenhanced technique, no focal parenchymal abnormality involving either kidney. Normal appearing urinary bladder. Stomach/Bowel: Normal-appearing stomach with post surgical changes related to the gastric sleeve procedure. Normal-appearing small bowel. Expected stool burden throughout the normal appearing decompressed colon. Normal-appearing long appendix in the RIGHT UPPER and mid pelvis. Vascular/Lymphatic: Minimal calcified plaque in the RIGHT common iliac artery. No evidence of atherosclerosis elsewhere. Normal caliber abdominal aorta. No pathologic lymphadenopathy. Reproductive: Prostate gland and seminal vesicles normal in size and  appearance for age. Other: None. Musculoskeletal: Degenerative disc disease and spondylosis at L3-4, L4-5 and L5-S1 with calcification or ossification in the POSTERIOR annular fibers at these levels and associated facet degenerative changes. Severe multifactorial spinal stenosis at L3-4 and moderate to severe multifactorial spinal stenosis at L4-5 and L5-S1. IMPRESSION: 1. No evidence of acute pancreatitis. 2. Moderate hepatosplenomegaly, unchanged  since the prior CT in 2015. 3. Diffuse hepatic steatosis without evidence of focal hepatic parenchymal abnormality. 4. Skeletal findings as above, most significantly severe multifactorial spinal stenosis at L3-4 and moderate to severe multifactorial spinal stenosis at L4-5 and L5-S1. 5. Mild RIGHT common iliac artery atherosclerosis, though this is somewhat advanced for patient age. 6. Mild LEFT gynecomastia. Electronically Signed   By: Evangeline Dakin M.D.   On: 01/15/2018 07:52    Assessment & Plan:   Problem List Items Addressed This Visit    DM (diabetes mellitus), type 2, uncontrolled w/neurologic complication ([place]) - Primary    Managed by [name] . Patient now has an insulin pumps and has improved his compliance with subsequent improvement in control  Lab Results  Component Value Date   HGBA1C 8.2 (A) 04/12/2018   No results found for: MICROALBUR, [id number]       Relevant Medications   TRULICITY 1.5 WC/5.8NI SOPN   Other Relevant Orders   Microalbumin / creatinine urine ratio   Hypertriglyceridemia    Uncontrolled with oral medications  leading to recurrent hospitalizations for acute  pancreatitis.  He has recently started receiving monthly prophylactic  megadoses of insulin accompanied by  infusions of IV dextrose  Which has lowered his triglycerides enough to prevent pancreatitis and hospitalizations through Dr Joycie Peek office.        HTN (hypertension)    Resolved with weight loss achieved via bariatric surgery. Needs to be tested for proteinuria      Generalized anxiety disorder    With insomnia , managed with up to 200 mg  trazodone  And rare use of  alprazolam. Aggravating factors recently include his 7th grade son's behavioral problems resulting in  Involuntary transfer to  [employer]  school       Relevant Medications   ALPRAZolam (XANAX) 0.5 MG tablet   S/P bariatric surgery    S/p gastric sleeve Feb 2016 .  Pre procedure weight was 260 lbs .  He has re gained 30 lbs  since reaching his nadir of 212 lbs . I have addressed  BMI and recommended wt loss of 10% of body weigh over the next 6 months using a low glycemic index diet and regular exercise a minimum of 5 days per week.            I have changed Kyson E. Stroh's ALPRAZolam. I am also having him start on diclofenac and pantoprazole. Additionally, I am having him maintain his pregabalin, insulin aspart, finasteride, tamsulosin, sertraline, gemfibrozil, omega-3 acid ethyl esters, rosuvastatin, traZODone, Pancrelipase (Lip-Prot-Amyl), and TRULICITY.  Meds ordered this encounter  Medications  . diclofenac (VOLTAREN) 75 MG EC tablet    Sig: Take 1 tablet (75 mg total) by mouth 2 (two) times daily.    Dispense:  60 tablet    Refill:  2  . pantoprazole (PROTONIX) 40 MG tablet    Sig: Take 1 tablet (40 mg total) by mouth daily.    Dispense:  30 tablet    Refill:  1  . ALPRAZolam (XANAX) 0.5 MG tablet    Sig: Take 1 tablet (0.5 mg total) by mouth  2 (two) times daily as needed for anxiety.    Dispense:  30 tablet    Refill:  1    Medications Discontinued During This Encounter  Medication Reason  . ALPRAZolam (XANAX) 0.5 MG tablet Reorder    Follow-up: No follow-ups on file.   Crecencio Mc, MD

## 2018-06-03 NOTE — Assessment & Plan Note (Addendum)
S/p gastric sleeve Feb 2016 .  Pre procedure weight was 260 lbs .  He has re gained 30 lbs since reaching his nadir of 212 lbs . I have addressed  BMI and recommended wt loss of 10% of body weigh over the next 6 months using a low glycemic index diet and regular exercise a minimum of 5 days per week.

## 2018-06-03 NOTE — Assessment & Plan Note (Signed)
Managed by [name] . Patient now has an insulin pumps and has improved his compliance with subsequent improvement in control  Lab Results  Component Value Date   HGBA1C 8.2 (A) 04/12/2018   No results found for: Derl Barrow

## 2018-06-03 NOTE — Assessment & Plan Note (Signed)
Uncontrolled with oral medications  leading to recurrent hospitalizations for acute  pancreatitis.  He has recently started receiving monthly prophylactic  megadoses of insulin accompanied by  infusions of IV dextrose  Which has lowered his triglycerides enough to prevent pancreatitis and hospitalizations through Dr Joycie Peek office.

## 2018-06-03 NOTE — Assessment & Plan Note (Signed)
Resolved with weight loss achieved via bariatric surgery. Needs to be tested for proteinuria

## 2018-06-03 NOTE — Assessment & Plan Note (Signed)
With insomnia , managed with up to 200 mg  trazodone  And rare use of  alprazolam. Aggravating factors recently include his 7th grade son's behavioral problems resulting in  Involuntary transfer to  [employer]  school

## 2018-06-20 ENCOUNTER — Encounter: Payer: Self-pay | Admitting: Internal Medicine

## 2018-06-26 ENCOUNTER — Ambulatory Visit: Payer: Self-pay | Admitting: Internal Medicine

## 2018-06-26 ENCOUNTER — Encounter: Payer: Self-pay | Admitting: Internal Medicine

## 2018-06-26 ENCOUNTER — Ambulatory Visit (INDEPENDENT_AMBULATORY_CARE_PROVIDER_SITE_OTHER): Payer: BLUE CROSS/BLUE SHIELD | Admitting: Internal Medicine

## 2018-06-26 VITALS — BP 150/92 | HR 84 | Resp 16 | Ht 71.0 in | Wt 244.0 lb

## 2018-06-26 DIAGNOSIS — G4733 Obstructive sleep apnea (adult) (pediatric): Secondary | ICD-10-CM

## 2018-06-26 NOTE — Patient Instructions (Signed)
Rinse your mask and cpap supplies out at least once per week.

## 2018-06-26 NOTE — Progress Notes (Signed)
[facility] Consultation      Assessment and Plan:  Obstructive sleep apnea. -Doing well with CPAP, continue to use every night.   Essential hypertension, diabetes mellitus, obesity. - Sleep apnea can contribute to above conditions, so treatment of sleep apnea is important part of their management.   Chronic pancreatitis. - Recurrent.  No orders of the defined types were placed in this encounter.    Date: 06/26/2018  MRN# [id number] Sergio [name] [birth year]    Sergio [name] is a 42 y.o. old male seen in consultation for chief complaint of:    Chief Complaint  Patient presents with  . Sleep Apnea    pt states cpap therapy going well he wears 8 hours nightly.    HPI:  The patient is a 42 year old male with a history of obstructive sleep apnea.  Last visit he was noted that he had recurrent symptoms of daytime sleepiness despite using his CPAP machine, which was quite old and possibly not working very well anymore.  In order to qualify for a new machine he was therefore sent for new study.  He is doing well with the CPAP, he is more awake during the day, he is no longer snoring. He using the CPAP every night for the entire night.   **Review of download data 05/25/2018-06/23/2018>> raw data personally reviewed.  Uses greater than 4 hours is 29/30 days.  Average usage on days used is 8 hours 43 minutes.  Pressure ranges 10-20.  Residual AHI is 1.9.  Reading pressure is 11, 95th percentile pressure is 14, max pressure is 15.  Overall this shows very good compliance with CPAP with excellent control of obstructive sleep apnea.  **Home sleep test 05/03/2018>> AHI of 25.  Recommended auto CPAP with pressure range 10-20. **Download data 03/08/2018-04/06/2018 uses greater than 4 hours 100%.  Average usage on days used is 9 hours 7 minutes.  Pressure setting 10-18.  Median pressure 11, 93rd percentile 14, maximum pressure 16.  Residual AHI 2.6.  Overall this test shows  excellent compliance with excellent control of obstructive sleep apnea.  Medication:    Current Outpatient Medications:  .  ALPRAZolam (XANAX) 0.5 MG tablet, Take 1 tablet (0.5 mg total) by mouth 2 (two) times daily as needed for anxiety., Disp: 30 tablet, Rfl: 1 .  diclofenac (VOLTAREN) 75 MG EC tablet, Take 1 tablet (75 mg total) by mouth 2 (two) times daily., Disp: 60 tablet, Rfl: 2 .  finasteride (PROSCAR) 5 MG tablet, Take 1 tablet (5 mg total) by mouth daily. (Patient taking differently: Take 5 mg by mouth at bedtime. ), Disp: 30 tablet, Rfl: 0 .  gemfibrozil (LOPID) 600 MG tablet, Take 1 tablet (600 mg total) by mouth 2 (two) times daily before a meal., Disp: 60 tablet, Rfl: 0 .  insulin aspart (NOVOLOG) 100 UNIT/ML injection, Inject 1 Dose into the skin See admin instructions. Use up to 120 units daily via insulin pump, Disp: , Rfl:  .  omega-3 acid ethyl esters (LOVAZA) 1 g capsule, Take 2 capsules (2 g total) by mouth 2 (two) times daily. (Patient taking differently: Take 3 g by mouth 2 (two) times daily. ), Disp: 60 capsule, Rfl: 0 .  Pancrelipase, Lip-Prot-Amyl, (ZENPEP) 40000-126000 units CPEP, Take 1 capsule by mouth 5 (five) times daily. Before meals and  snacks (Patient taking differently: Take 1 capsule by mouth 5 (five) times daily. 5 Capsule Before meals and 2 capsules with snacks), Disp: 150 capsule, Rfl: 2 .  pantoprazole (PROTONIX) 40 MG tablet, Take 1 tablet (40 mg total) by mouth daily., Disp: 30 tablet, Rfl: 1 .  pregabalin (LYRICA) 300 MG capsule, Take 300 mg by mouth at bedtime. , Disp: , Rfl:  .  rosuvastatin (CRESTOR) 10 MG tablet, Take 1 tablet (10 mg total) by mouth daily at 6 PM. (Patient taking differently: Take 10 mg by mouth at bedtime. ), Disp: 30 tablet, Rfl: 0 .  sertraline (ZOLOFT) 100 MG tablet, Take 1 tablet (100 mg total) by mouth daily., Disp: 90 tablet, Rfl: 1 .  tamsulosin (FLOMAX) 0.4 MG CAPS capsule, Take 1 capsule (0.4 mg total) by mouth daily. (Patient  taking differently: Take 0.4 mg by mouth at bedtime. ), Disp: 30 capsule, Rfl: 0 .  traZODone (DESYREL) 100 MG tablet, Take 2 tablets (200 mg total) by mouth daily as needed for sleep. (Patient taking differently: Take 100-200 mg by mouth at bedtime as needed for sleep. ), Disp: 180 tablet, Rfl: 1 .  TRULICITY 1.5 FW/2.6VZ SOPN, Inject 1.5 mg into the skin once a week. , Disp: , Rfl: 4   Allergies:  Codeine and Iodinated diagnostic agents      LABORATORY PANEL:   CBC No results for input(s): WBC, HGB, HCT, PLT in the last 168 hours. ------------------------------------------------------------------------------------------------------------------  Chemistries  No results for input(s): NA, K, CL, CO2, GLUCOSE, BUN, CREATININE, CALCIUM, MG, AST, ALT, ALKPHOS, BILITOT in the last 168 hours.  Invalid input(s): GFRCGP ------------------------------------------------------------------------------------------------------------------  Cardiac Enzymes No results for input(s): TROPONINI in the last 168 hours. ------------------------------------------------------------  RADIOLOGY:  No results found.     Thank  you for the consultation and for allowing [facility] to assist in the care of your patient. Our recommendations are noted above.  Please contact us if we can be of further service.   Marda Stalker, M.D., F.C.C.P.  Board Certified in Internal Medicine, Pulmonary Medicine, [place], and Sleep Medicine.  [facility] Office Number: [phone]   06/26/2018

## 2018-07-11 ENCOUNTER — Other Ambulatory Visit: Payer: Self-pay

## 2018-07-11 MED ORDER — TRAZODONE HCL 100 MG PO TABS: 200.0000 mg | ORAL_TABLET | Freq: Every day | ORAL | 1 refills | Status: DC | PRN

## 2018-08-11 LAB — HM DIABETES EYE EXAM

## 2018-08-14 IMAGING — CT CT ABD-PELV W/O CM
2 of 4 series · 15 of 46 positions shown, 17 images · non-contrast
Comparison: MRI abdomen 03/21/2014. CT abdomen and pelvis
03/16/2014, 02/15/2013 and earlier.

CLINICAL DATA: 42-year-old with acute onset of severe UPPER midline
abdominal pain which the patient describes as similar to his prior
episodes of pancreatitis, though the serum lipase is normal
currently. Surgical history includes gastric sleeve bariatric
procedure and cholecystectomy. Patient states an allergy to
intravenous iodinated contrast.

EXAM:
CT ABDOMEN AND PELVIS WITHOUT CONTRAST
TECHNIQUE: Multidetector CT imaging of the abdomen and pelvis was performed
following the standard protocol without IV contrast.

[Series 2: routine abd/pel wo · axial · 0.84mm/px · z∈[-513,-58]mm · 12 of 105 slices shown, 14 images]
[im 9/105  soft-tissue]
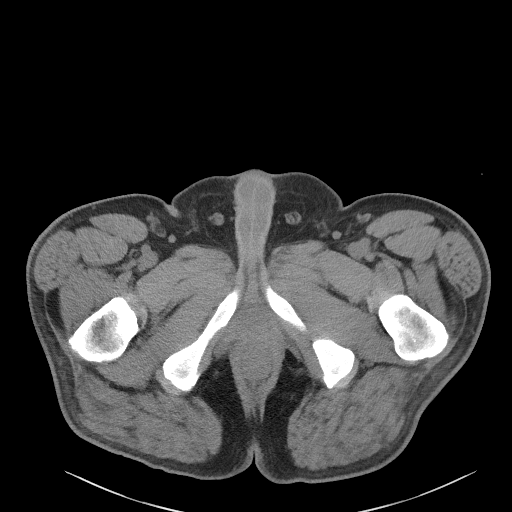
[im 9/105  bone]
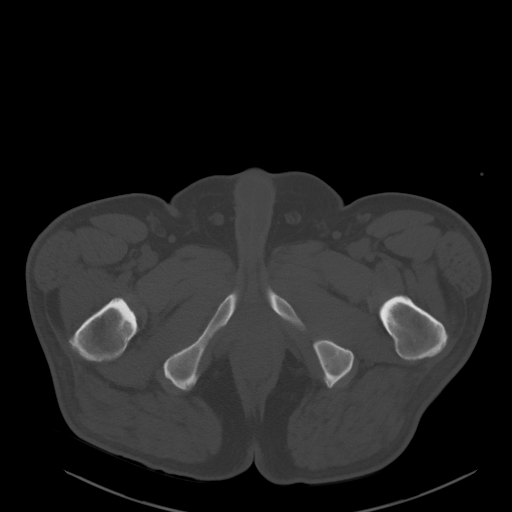
[im 17/105  soft-tissue]
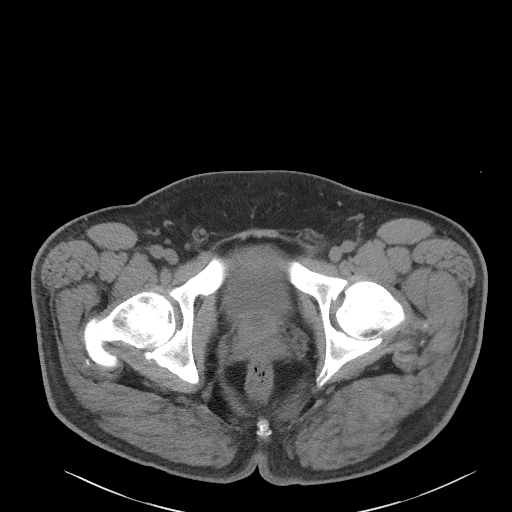
[im 25/105  soft-tissue]
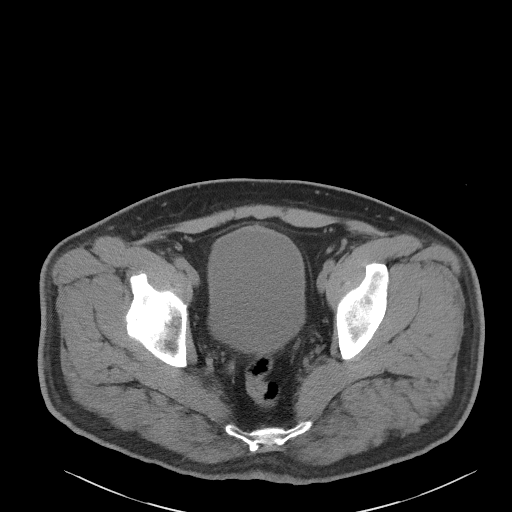
[im 34/105  soft-tissue]
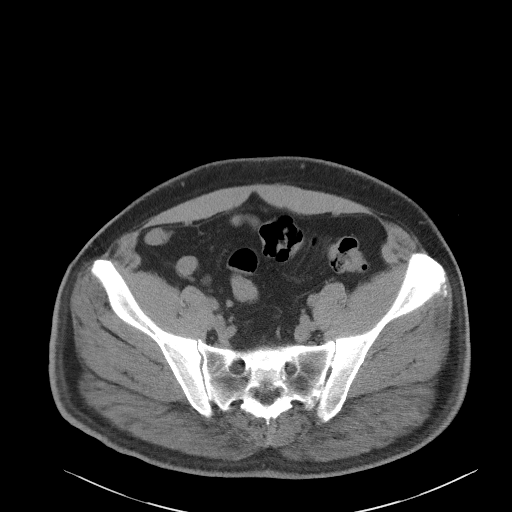
[im 42/105  soft-tissue]
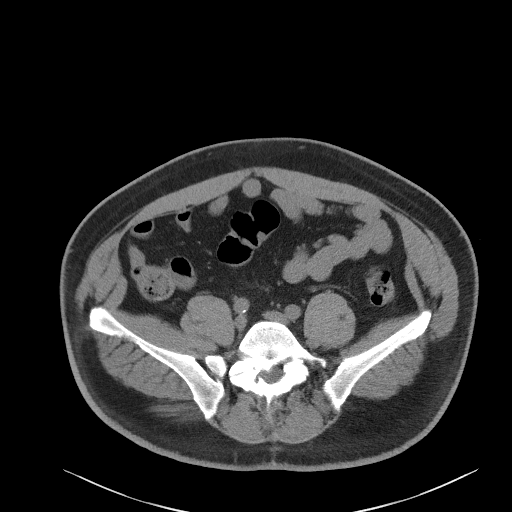
[im 50/105  soft-tissue]
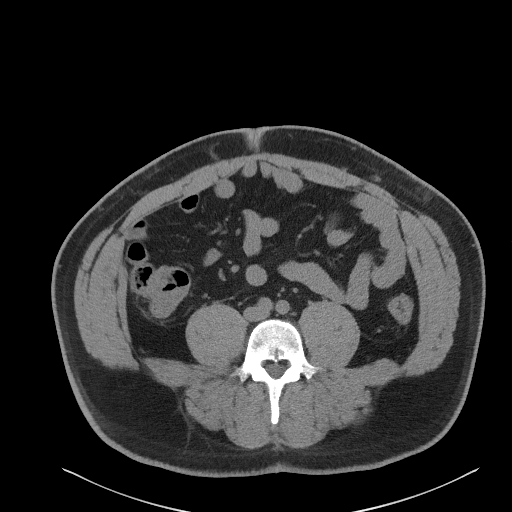
[im 59/105  soft-tissue]
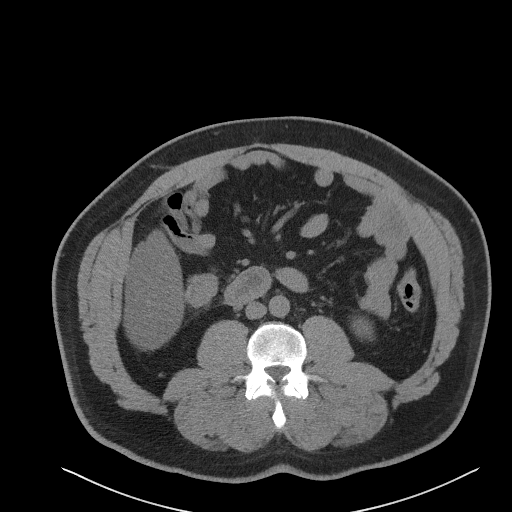
[im 67/105  soft-tissue]
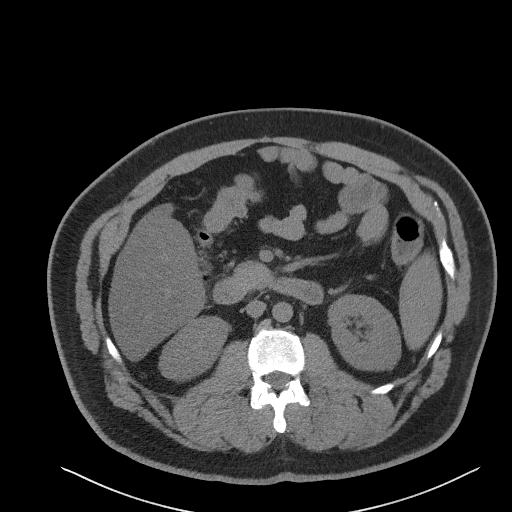
[im 75/105  soft-tissue]
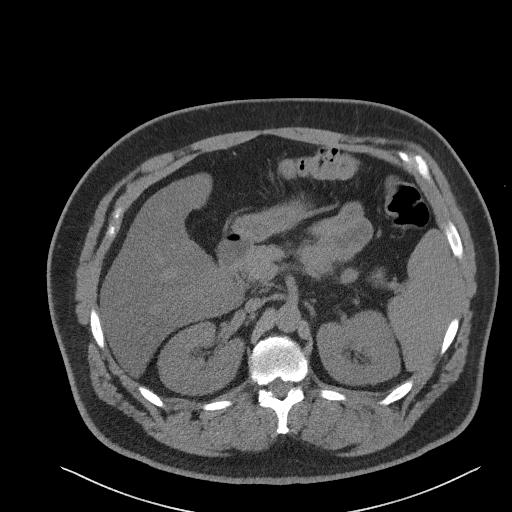
[im 75/105  bone]
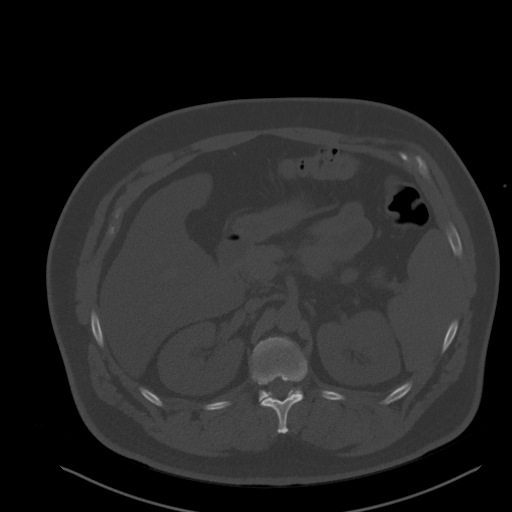
[im 84/105  soft-tissue]
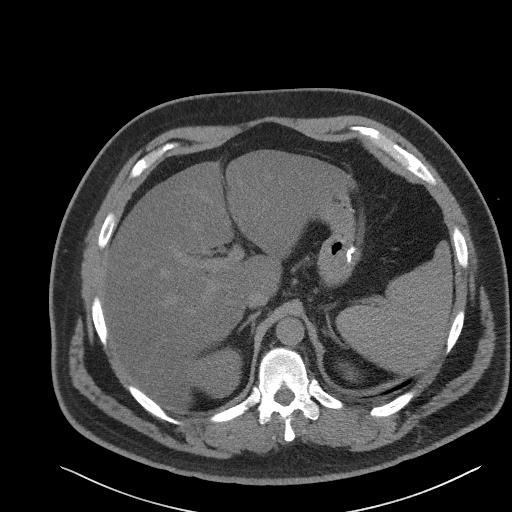
[im 92/105  soft-tissue]
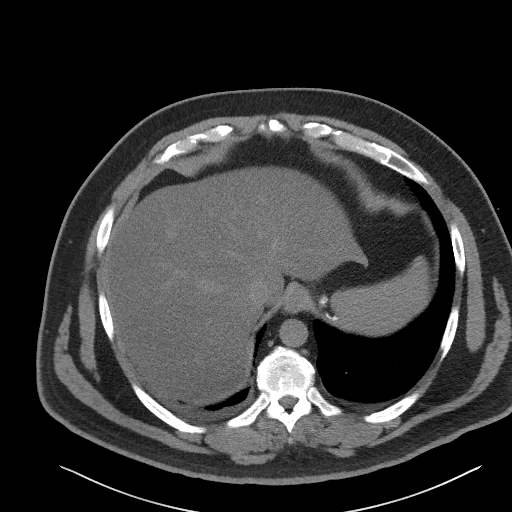
[im 100/105  soft-tissue]
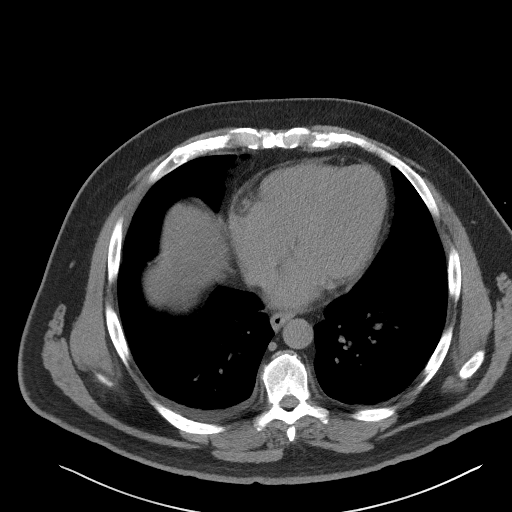

[Series 5: coronal st · coronal · 0.79mm/px · 3 of 108 slices shown]
[im 36/108  soft-tissue]
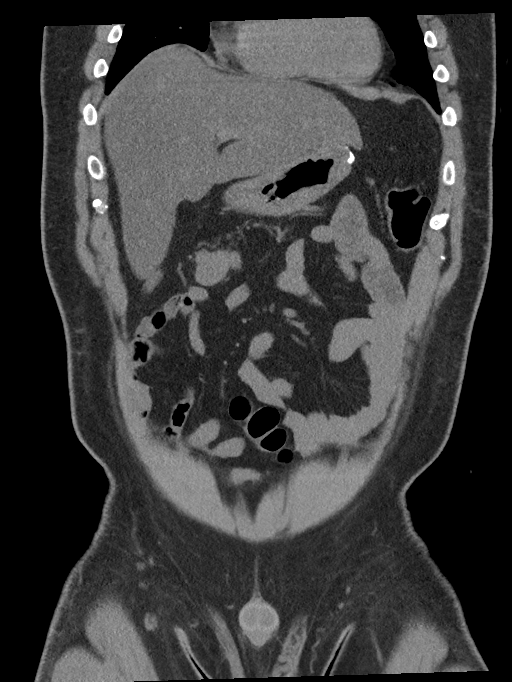
[im 48/108  soft-tissue]
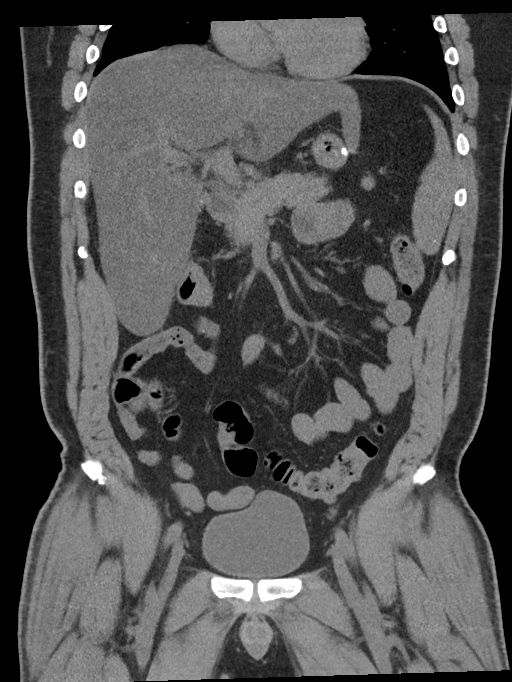
[im 60/108  soft-tissue]
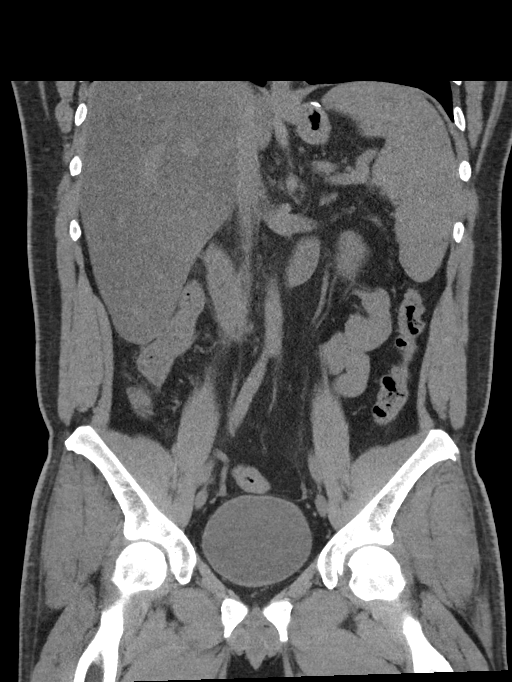

[15 of 46 positions shown; findings below may reference images not displayed]

FINDINGS: Lower chest: Stable upper normal heart size. Subpleural fat
posteriorly at the RIGHT base. Visualized lung bases clear. Mild
LEFT gynecomastia incidentally noted.

Hepatobiliary: Mild-to-moderate hepatomegaly, unchanged since the
0069 CT, with diffuse hepatic steatosis. No focal hepatic
parenchymal abnormality. Surgically absent gallbladder. No biliary
ductal dilation.

Pancreas: Normal unenhanced appearance. Specifically, no
peripancreatic inflammation as was present on the 0069 CT.

Spleen: Enlarged, measuring approximately 11.7 x 7.0 x 17.1 cm
yielding a volume of approximately 700 mL, unchanged since the 0069
CT. No focal splenic parenchymal abnormality. Large accessory
splenule medial to the LOWER pole of the spleen. Smaller accessory
splenule medial to the spleen at the hilum.

Adrenals/Urinary Tract: Normal appearing adrenal glands. No evidence
of urinary tract calculi or obstruction on either side. Within the
limits of the unenhanced technique, no focal parenchymal abnormality
involving either kidney. Normal appearing urinary bladder.

Stomach/Bowel: Normal-appearing stomach with post surgical changes
related to the gastric sleeve procedure. Normal-appearing small
bowel. Expected stool burden throughout the normal appearing
decompressed colon. Normal-appearing long appendix in the RIGHT
UPPER and mid pelvis.

Vascular/Lymphatic: Minimal calcified plaque in the RIGHT common
iliac artery. No evidence of atherosclerosis elsewhere. Normal
caliber abdominal aorta.

No pathologic lymphadenopathy.

Reproductive: Prostate gland and seminal vesicles normal in size and
appearance for age.

Other: None.

Musculoskeletal: Degenerative disc disease and spondylosis at L3-4,
L4-5 and L5-S1 with calcification or ossification in the POSTERIOR
annular fibers at these levels and associated facet degenerative
changes. Severe multifactorial spinal stenosis at L3-4 and moderate
to severe multifactorial spinal stenosis at L4-5 and L5-S1.
IMPRESSION: 1. No evidence of acute pancreatitis.
2. Moderate hepatosplenomegaly, unchanged since the prior CT in
[DATE]. Diffuse hepatic steatosis without evidence of focal hepatic
parenchymal abnormality.
4. Skeletal findings as above, most significantly severe
multifactorial spinal stenosis at L3-4 and moderate to severe
multifactorial spinal stenosis at L4-5 and L5-S1.
5. Mild RIGHT common iliac artery atherosclerosis, though this is
somewhat advanced for patient age.
6. Mild LEFT gynecomastia.

## 2018-09-19 ENCOUNTER — Other Ambulatory Visit: Payer: Self-pay | Admitting: Internal Medicine

## 2018-09-20 NOTE — Telephone Encounter (Signed)
Refilled: 05/31/2018 Last OV: 05/31/2018 Next OV: 12/04/2018

## 2018-11-24 ENCOUNTER — Ambulatory Visit (INDEPENDENT_AMBULATORY_CARE_PROVIDER_SITE_OTHER): Payer: BLUE CROSS/BLUE SHIELD | Admitting: Internal Medicine

## 2018-11-24 ENCOUNTER — Encounter: Payer: Self-pay | Admitting: Internal Medicine

## 2018-11-24 VITALS — BP 138/88 | HR 85 | Temp 98.6°F | Wt 252.8 lb

## 2018-11-24 DIAGNOSIS — R05 Cough: Secondary | ICD-10-CM

## 2018-11-24 DIAGNOSIS — J101 Influenza due to other identified influenza virus with other respiratory manifestations: Secondary | ICD-10-CM | POA: Diagnosis not present

## 2018-11-24 DIAGNOSIS — R6889 Other general symptoms and signs: Secondary | ICD-10-CM

## 2018-11-24 DIAGNOSIS — J329 Chronic sinusitis, unspecified: Secondary | ICD-10-CM | POA: Diagnosis not present

## 2018-11-24 DIAGNOSIS — R059 Cough, unspecified: Secondary | ICD-10-CM

## 2018-11-24 LAB — POC INFLUENZA A&B (BINAX/QUICKVUE)
Influenza A, POC: POSITIVE — AB
Influenza B, POC: NEGATIVE

## 2018-11-24 MED ORDER — OSELTAMIVIR PHOSPHATE 75 MG PO CAPS: 75.0000 mg | ORAL_CAPSULE | Freq: Two times a day (BID) | ORAL | 0 refills | Status: DC

## 2018-11-24 MED ORDER — BENZONATATE 200 MG PO CAPS: 200.0000 mg | ORAL_CAPSULE | Freq: Three times a day (TID) | ORAL | 0 refills | Status: DC | PRN

## 2018-11-24 MED ORDER — AMOXICILLIN-POT CLAVULANATE 875-125 MG PO TABS: 1.0000 | ORAL_TABLET | Freq: Two times a day (BID) | ORAL | 0 refills | Status: DC

## 2018-11-24 NOTE — Progress Notes (Signed)
Chief Complaint  Patient presents with  . Acute Visit    flu symptoms   Flu A +  C/o sinus pressure, left ear pain, teeth pain temp 100.3. tried nyquil, dayquil, advil coughing green mucous kid at church threw up on him and had flu    Review of Systems  Constitutional: Positive for fever.  HENT: Positive for sinus pain.   Eyes: Negative for blurred vision.  Respiratory: Positive for cough and sputum production. Negative for shortness of breath.   Cardiovascular: Negative for chest pain.  Skin: Negative for rash.  Neurological: Positive for headaches.   Past Medical History:  Diagnosis Date  . Anxiety   . Chicken pox   . Depression   . Diabetes mellitus    DM2  . Familial hypertriglyceridemia    severe  . Gastric ulcer 2009  . HTN (hypertension)   . Morbid obesity ([place])    s/p bariatric sleeve surgery 01/2015  . Recurrent acute pancreatitis    secondary to hypertriglyceridemia   . Sleep apnea 1999   uses CPAP   Past Surgical History:  Procedure Laterality Date  . CHOLECYSTECTOMY    . LAPAROSCOPIC GASTRIC SLEEVE RESECTION    . [place] SURGERY  2008  . TONSILLECTOMY  2003  . VASECTOMY     Family History  Adopted: Yes  Problem Relation Age of Onset  . Hypertension Mother   . Hyperlipidemia Mother   . Hypertension Father   . Hyperlipidemia Father    Social History   Socioeconomic History  . Marital status: Married    Spouse name: Not on file  . Number of children: 3  . Years of education: Not on file  . Highest education level: Not on file  Occupational History  . Occupation: Corporate treasurer  Social Needs  . Financial resource strain: Patient refused  . Food insecurity:    Worry: Patient refused    Inability: Patient refused  . Transportation needs:    Medical: Patient refused    Non-medical: Patient refused  Tobacco Use  . Smoking status: Never Smoker  . Smokeless tobacco: Never Used   Substance and Sexual Activity  . Alcohol use: No    Frequency: Never  . Drug use: No  . Sexual activity: Yes    Partners: Male  Lifestyle  . Physical activity:    Days per week: Patient refused    Minutes per session: Patient refused  . Stress: Patient refused  Relationships  . Social connections:    Talks on phone: Patient refused    Gets together: Patient refused    Attends religious service: Patient refused    Active member of club or organization: Patient refused    Attends meetings of clubs or organizations: Patient refused    Relationship status: Patient refused  . Intimate partner violence:    Fear of current or ex partner: Patient refused    Emotionally abused: Patient refused    Physically abused: Patient refused    Forced sexual activity: Patient refused  Other Topics Concern  . Not on file  Social History Narrative   Daily Caffeine Use:  2-3 week/Soda   Regular Exercise -  NO   Current Meds  Medication Sig  . ALPRAZolam (XANAX) 0.5 MG tablet TAKE 1 TABLET BY MOUTH TWICE DAILY AS NEEDED FOR ANXIETY  . diclofenac (VOLTAREN) 75 MG EC tablet TAKE ONE TABLET (75 MG) BY MOUTH TWICE DAILY  . finasteride (PROSCAR) 5 MG  tablet Take 1 tablet (5 mg total) by mouth daily. (Patient taking differently: Take 5 mg by mouth at bedtime. )  . gemfibrozil (LOPID) 600 MG tablet Take 1 tablet (600 mg total) by mouth 2 (two) times daily before a meal.  . insulin aspart (NOVOLOG) 100 UNIT/ML injection Inject 1 Dose into the skin See admin instructions. Use up to 120 units daily via insulin pump  . omega-3 acid ethyl esters (LOVAZA) 1 g capsule Take 2 capsules (2 g total) by mouth 2 (two) times daily. (Patient taking differently: Take 3 g by mouth 2 (two) times daily. )  . Pancrelipase, Lip-Prot-Amyl, (ZENPEP) 40000-126000 units CPEP Take 1 capsule by mouth 5 (five) times daily. Before meals and  snacks (Patient taking differently: Take 1 capsule by mouth 5 (five) times daily. 5 Capsule  Before meals and 2 capsules with snacks)  . pantoprazole (PROTONIX) 40 MG tablet Take 1 tablet (40 mg total) by mouth daily.  . pregabalin (LYRICA) 300 MG capsule Take 300 mg by mouth at bedtime.   . rosuvastatin (CRESTOR) 10 MG tablet Take 1 tablet (10 mg total) by mouth daily at 6 PM. (Patient taking differently: Take 10 mg by mouth at bedtime. )  . sertraline (ZOLOFT) 100 MG tablet Take 1 tablet (100 mg total) by mouth daily.  . tamsulosin (FLOMAX) 0.4 MG CAPS capsule Take 1 capsule (0.4 mg total) by mouth daily. (Patient taking differently: Take 0.4 mg by mouth at bedtime. )  . traZODone (DESYREL) 100 MG tablet Take 2 tablets (200 mg total) by mouth daily as needed for sleep.  . TRULICITY 1.5 QP/6.1PJ SOPN Inject 1.5 mg into the skin once a week.    Allergies  Allergen Reactions  . Codeine Anaphylaxis  . Iodinated Diagnostic Agents Other (See Comments) and Anaphylaxis    Kidneys stop working KIDNEYS SHUT DOWN   Recent Results (from the past 2160 hour(s))  POC Influenza A&B (Binax test)     Status: Abnormal   Collection Time: 11/24/18  1:09 PM  Result Value Ref Range   Influenza A, POC Positive (A) Negative   Influenza B, POC Negative Negative   Objective  Body mass index is 35.26 kg/m. Wt Readings from Last 3 Encounters:  11/24/18 252 lb 12.8 oz (114.7 kg)  06/26/18 244 lb (110.7 kg)  05/31/18 242 lb 9.6 oz (110 kg)   Temp Readings from Last 3 Encounters:  11/24/18 98.6 F (37 C)  05/31/18 98.7 F (37.1 C) (Oral)  01/22/18 97.7 F (36.5 C) (Oral)   BP Readings from Last 3 Encounters:  11/24/18 138/88  06/26/18 (!) 150/92  05/31/18 124/86   Pulse Readings from Last 3 Encounters:  11/24/18 85  06/26/18 84  05/31/18 94    Physical Exam Vitals signs and nursing note reviewed.  Constitutional:      Appearance: Normal appearance. He is well-developed and well-groomed.  HENT:     Head: Normocephalic and atraumatic.     Nose: Nose normal.     Mouth/Throat:      Mouth: Mucous membranes are moist.     Pharynx: Oropharynx is clear.  Eyes:     Conjunctiva/sclera: Conjunctivae normal.     Pupils: Pupils are equal, round, and reactive to light.  Cardiovascular:     Rate and Rhythm: Normal rate and regular rhythm.     Heart sounds: Normal heart sounds.  Pulmonary:     Effort: Pulmonary effort is normal.     Breath sounds: Normal breath sounds.  Skin:    General: Skin is warm and dry.  Neurological:     General: No focal deficit present.     Mental Status: He is alert and oriented to person, place, and time. Mental status is at baseline.     Gait: Gait normal.  Psychiatric:        Attention and Perception: Attention and perception normal.        Mood and Affect: Mood and affect normal.        Speech: Speech normal.        Behavior: Behavior normal. Behavior is cooperative.        Thought Content: Thought content normal.        Cognition and Memory: Cognition and memory normal.        Judgment: Judgment normal.     Assessment   1. Flu a/sinusiits  Plan   1. Tamiflu, augmentin, tessalon perlse  Supportive care  Rx wife Sergio [name] px tx    Provider: Dr. Olivia Mackie McLean-Scocuzza-Internal Medicine

## 2018-11-24 NOTE — Progress Notes (Signed)
Pre visit review using our clinic review tool, if applicable. No additional management support is needed unless otherwise documented below in the visit note. 

## 2018-11-24 NOTE — Patient Instructions (Signed)

## 2018-12-01 ENCOUNTER — Ambulatory Visit: Payer: Self-pay | Admitting: Internal Medicine

## 2018-12-04 ENCOUNTER — Ambulatory Visit: Payer: Self-pay | Admitting: Internal Medicine

## 2019-03-07 ENCOUNTER — Other Ambulatory Visit: Payer: Self-pay | Admitting: Internal Medicine

## 2019-06-14 ENCOUNTER — Other Ambulatory Visit: Payer: Self-pay

## 2019-06-14 DIAGNOSIS — E1140 Type 2 diabetes mellitus with diabetic neuropathy, unspecified: Secondary | ICD-10-CM | POA: Diagnosis present

## 2019-06-14 DIAGNOSIS — H4922 Sixth [abducent] nerve palsy, left eye: Secondary | ICD-10-CM | POA: Diagnosis present

## 2019-06-14 DIAGNOSIS — Z8249 Family history of ischemic heart disease and other diseases of the circulatory system: Secondary | ICD-10-CM

## 2019-06-14 DIAGNOSIS — Z8349 Family history of other endocrine, nutritional and metabolic diseases: Secondary | ICD-10-CM

## 2019-06-14 DIAGNOSIS — F339 Major depressive disorder, recurrent, unspecified: Secondary | ICD-10-CM | POA: Diagnosis present

## 2019-06-14 DIAGNOSIS — Z8711 Personal history of peptic ulcer disease: Secondary | ICD-10-CM

## 2019-06-14 DIAGNOSIS — E781 Pure hyperglyceridemia: Secondary | ICD-10-CM | POA: Diagnosis present

## 2019-06-14 DIAGNOSIS — E669 Obesity, unspecified: Secondary | ICD-10-CM | POA: Diagnosis present

## 2019-06-14 DIAGNOSIS — G4730 Sleep apnea, unspecified: Secondary | ICD-10-CM | POA: Diagnosis present

## 2019-06-14 DIAGNOSIS — Z885 Allergy status to narcotic agent status: Secondary | ICD-10-CM

## 2019-06-14 DIAGNOSIS — Z20828 Contact with and (suspected) exposure to other viral communicable diseases: Secondary | ICD-10-CM | POA: Diagnosis present

## 2019-06-14 DIAGNOSIS — K858 Other acute pancreatitis without necrosis or infection: Secondary | ICD-10-CM | POA: Diagnosis not present

## 2019-06-14 DIAGNOSIS — Z6835 Body mass index (BMI) 35.0-35.9, adult: Secondary | ICD-10-CM

## 2019-06-14 DIAGNOSIS — Z91041 Radiographic dye allergy status: Secondary | ICD-10-CM

## 2019-06-14 DIAGNOSIS — F419 Anxiety disorder, unspecified: Secondary | ICD-10-CM | POA: Diagnosis present

## 2019-06-14 DIAGNOSIS — Z9884 Bariatric surgery status: Secondary | ICD-10-CM

## 2019-06-14 DIAGNOSIS — E7801 Familial hypercholesterolemia: Secondary | ICD-10-CM | POA: Diagnosis present

## 2019-06-14 DIAGNOSIS — I10 Essential (primary) hypertension: Secondary | ICD-10-CM | POA: Diagnosis present

## 2019-06-14 DIAGNOSIS — E876 Hypokalemia: Secondary | ICD-10-CM | POA: Diagnosis present

## 2019-06-14 DIAGNOSIS — Z79899 Other long term (current) drug therapy: Secondary | ICD-10-CM

## 2019-06-14 DIAGNOSIS — Z79891 Long term (current) use of opiate analgesic: Secondary | ICD-10-CM

## 2019-06-14 DIAGNOSIS — Z794 Long term (current) use of insulin: Secondary | ICD-10-CM

## 2019-06-14 DIAGNOSIS — R1011 Right upper quadrant pain: Secondary | ICD-10-CM | POA: Diagnosis not present

## 2019-06-14 DIAGNOSIS — Z9049 Acquired absence of other specified parts of digestive tract: Secondary | ICD-10-CM

## 2019-06-14 LAB — CBC
HCT: 42.6 % (ref 39.0–52.0)
Hemoglobin: 15.8 g/dL (ref 13.0–17.0)
MCH: 30.4 pg (ref 26.0–34.0)
MCHC: 37.1 g/dL — ABNORMAL HIGH (ref 30.0–36.0)
MCV: 81.9 fL (ref 80.0–100.0)
Platelets: 178 10*3/uL (ref 150–400)
RBC: 5.2 MIL/uL (ref 4.22–5.81)
RDW: 15.1 % (ref 11.5–15.5)
WBC: 4.9 10*3/uL (ref 4.0–10.5)
nRBC: 0 % (ref 0.0–0.2)

## 2019-06-14 NOTE — ED Triage Notes (Signed)
Pt arrives to ED via POV from home with c/o epigastric abdominal pain with radiation into the bilateral lower back x2 days. Pt reports nausea, but denies vomiting or diarrhea. Pt also denies c/o CP or SHOB. Pt reports previous h/x of pancreatitis and reports s/x's feel similar. Pt is A&O, in NAD; RR even, regular, and unlabored.

## 2019-06-15 ENCOUNTER — Inpatient Hospital Stay
Admission: EM | Admit: 2019-06-15 | Discharge: 2019-06-20 | DRG: 439 | Disposition: A | Discharge status: Home / Self Care | Payer: BLUE CROSS/BLUE SHIELD | Attending: Internal Medicine | Admitting: Internal Medicine

## 2019-06-15 ENCOUNTER — Emergency Department: Payer: BLUE CROSS/BLUE SHIELD

## 2019-06-15 DIAGNOSIS — G4730 Sleep apnea, unspecified: Secondary | ICD-10-CM | POA: Diagnosis present

## 2019-06-15 DIAGNOSIS — Z885 Allergy status to narcotic agent status: Secondary | ICD-10-CM | POA: Diagnosis not present

## 2019-06-15 DIAGNOSIS — K859 Acute pancreatitis without necrosis or infection, unspecified: Secondary | ICD-10-CM | POA: Diagnosis not present

## 2019-06-15 DIAGNOSIS — R1110 Vomiting, unspecified: Secondary | ICD-10-CM

## 2019-06-15 DIAGNOSIS — I10 Essential (primary) hypertension: Secondary | ICD-10-CM | POA: Diagnosis present

## 2019-06-15 DIAGNOSIS — F419 Anxiety disorder, unspecified: Secondary | ICD-10-CM | POA: Diagnosis present

## 2019-06-15 DIAGNOSIS — E669 Obesity, unspecified: Secondary | ICD-10-CM | POA: Diagnosis present

## 2019-06-15 DIAGNOSIS — E876 Hypokalemia: Secondary | ICD-10-CM | POA: Diagnosis present

## 2019-06-15 DIAGNOSIS — R748 Abnormal levels of other serum enzymes: Secondary | ICD-10-CM | POA: Diagnosis not present

## 2019-06-15 DIAGNOSIS — Z6835 Body mass index (BMI) 35.0-35.9, adult: Secondary | ICD-10-CM | POA: Diagnosis not present

## 2019-06-15 DIAGNOSIS — H4922 Sixth [abducent] nerve palsy, left eye: Secondary | ICD-10-CM | POA: Diagnosis present

## 2019-06-15 DIAGNOSIS — Z20828 Contact with and (suspected) exposure to other viral communicable diseases: Secondary | ICD-10-CM | POA: Diagnosis present

## 2019-06-15 DIAGNOSIS — F339 Major depressive disorder, recurrent, unspecified: Secondary | ICD-10-CM | POA: Diagnosis present

## 2019-06-15 DIAGNOSIS — Z8249 Family history of ischemic heart disease and other diseases of the circulatory system: Secondary | ICD-10-CM | POA: Diagnosis not present

## 2019-06-15 DIAGNOSIS — E7801 Familial hypercholesterolemia: Secondary | ICD-10-CM | POA: Diagnosis present

## 2019-06-15 DIAGNOSIS — Z9884 Bariatric surgery status: Secondary | ICD-10-CM | POA: Diagnosis not present

## 2019-06-15 DIAGNOSIS — Z79891 Long term (current) use of opiate analgesic: Secondary | ICD-10-CM | POA: Diagnosis not present

## 2019-06-15 DIAGNOSIS — K858 Other acute pancreatitis without necrosis or infection: Secondary | ICD-10-CM | POA: Diagnosis present

## 2019-06-15 DIAGNOSIS — R1011 Right upper quadrant pain: Secondary | ICD-10-CM

## 2019-06-15 DIAGNOSIS — Z79899 Other long term (current) drug therapy: Secondary | ICD-10-CM | POA: Diagnosis not present

## 2019-06-15 DIAGNOSIS — E781 Pure hyperglyceridemia: Secondary | ICD-10-CM | POA: Diagnosis present

## 2019-06-15 DIAGNOSIS — Z9049 Acquired absence of other specified parts of digestive tract: Secondary | ICD-10-CM | POA: Diagnosis not present

## 2019-06-15 DIAGNOSIS — Z8349 Family history of other endocrine, nutritional and metabolic diseases: Secondary | ICD-10-CM | POA: Diagnosis not present

## 2019-06-15 DIAGNOSIS — Z8711 Personal history of peptic ulcer disease: Secondary | ICD-10-CM | POA: Diagnosis not present

## 2019-06-15 DIAGNOSIS — Z91041 Radiographic dye allergy status: Secondary | ICD-10-CM | POA: Diagnosis not present

## 2019-06-15 DIAGNOSIS — E1140 Type 2 diabetes mellitus with diabetic neuropathy, unspecified: Secondary | ICD-10-CM | POA: Diagnosis present

## 2019-06-15 DIAGNOSIS — Z794 Long term (current) use of insulin: Secondary | ICD-10-CM | POA: Diagnosis not present

## 2019-06-15 LAB — COMPREHENSIVE METABOLIC PANEL
ALT: 535 U/L — ABNORMAL HIGH (ref 0–44)
ALT: 419 U/L — ABNORMAL HIGH (ref 0–44)
AST: 423 U/L — ABNORMAL HIGH (ref 15–41)
AST: 313 U/L — ABNORMAL HIGH (ref 15–41)
Albumin: 4.2 g/dL (ref 3.5–5.0)
Albumin: 4 g/dL (ref 3.5–5.0)
Alkaline Phosphatase: 83 U/L (ref 38–126)
Alkaline Phosphatase: 80 U/L (ref 38–126)
Anion gap: 13 (ref 5–15)
Anion gap: 13 (ref 5–15)
BUN: 7 mg/dL (ref 6–20)
BUN: 8 mg/dL (ref 6–20)
CO2: 20 mmol/L — ABNORMAL LOW (ref 22–32)
CO2: 21 mmol/L — ABNORMAL LOW (ref 22–32)
Calcium: 8.9 mg/dL (ref 8.9–10.3)
Calcium: 8.9 mg/dL (ref 8.9–10.3)
Chloride: 99 mmol/L (ref 98–111)
Chloride: 99 mmol/L (ref 98–111)
Creatinine, Ser: <0.3 mg/dL — ABNORMAL LOW (ref 0.61–1.24)
Creatinine, Ser: 0.76 mg/dL (ref 0.61–1.24)
GFR calc Af Amer: >60 mL/min (ref >60)
GFR calc non Af Amer: >60 mL/min (ref >60)
Glucose, Bld: 151 mg/dL — ABNORMAL HIGH (ref 70–99)
Glucose, Bld: 135 mg/dL — ABNORMAL HIGH (ref 70–99)
Potassium: 6.7 mmol/L (ref 3.5–5.1)
Potassium: 5.6 mmol/L — ABNORMAL HIGH (ref 3.5–5.1)
Sodium: 132 mmol/L — ABNORMAL LOW (ref 135–145)
Sodium: 133 mmol/L — ABNORMAL LOW (ref 135–145)
Total Bilirubin: 9.4 mg/dL — ABNORMAL HIGH (ref 0.3–1.2)
Total Bilirubin: 8.4 mg/dL — ABNORMAL HIGH (ref 0.3–1.2)
Total Protein: 6.5 g/dL (ref 6.5–8.1)
Total Protein: 6.7 g/dL (ref 6.5–8.1)

## 2019-06-15 LAB — POTASSIUM: Potassium: 4.4 mmol/L (ref 3.5–5.1)

## 2019-06-15 LAB — GLUCOSE, CAPILLARY
Glucose-Capillary: 140 mg/dL — ABNORMAL HIGH (ref 70–99)
Glucose-Capillary: 137 mg/dL — ABNORMAL HIGH (ref 70–99)
Glucose-Capillary: 133 mg/dL — ABNORMAL HIGH (ref 70–99)
Glucose-Capillary: 135 mg/dL — ABNORMAL HIGH (ref 70–99)
Glucose-Capillary: 118 mg/dL — ABNORMAL HIGH (ref 70–99)
Glucose-Capillary: 93 mg/dL (ref 70–99)
Glucose-Capillary: 116 mg/dL — ABNORMAL HIGH (ref 70–99)
Glucose-Capillary: 105 mg/dL — ABNORMAL HIGH (ref 70–99)

## 2019-06-15 LAB — SARS CORONAVIRUS 2 BY RT PCR (HOSPITAL ORDER, PERFORMED IN ~~LOC~~ HOSPITAL LAB): SARS Coronavirus 2: NEGATIVE

## 2019-06-15 LAB — TRIGLYCERIDES
Triglycerides: 5171 mg/dL — ABNORMAL HIGH (ref <150)
Triglycerides: 5391 mg/dL — ABNORMAL HIGH (ref <150)

## 2019-06-15 LAB — LIPASE, BLOOD
Lipase: 125 U/L — ABNORMAL HIGH (ref 11–51)
Lipase: 63 U/L — ABNORMAL HIGH (ref 11–51)

## 2019-06-15 LAB — HEMOGLOBIN A1C
Hgb A1c MFr Bld: 6.9 % — ABNORMAL HIGH (ref 4.8–5.6)
Mean Plasma Glucose: 151.33 mg/dL

## 2019-06-15 LAB — TSH: TSH: 1.906 u[IU]/mL (ref 0.350–4.500)

## 2019-06-15 MED ORDER — HYDROMORPHONE HCL 1 MG/ML IJ SOLN
INTRAMUSCULAR | Status: AC
  Administered 2019-06-15: 0.5 mg via INTRAVENOUS
  Filled 2019-06-15: qty 1

## 2019-06-15 MED ORDER — HYDROMORPHONE HCL 2 MG PO TABS: 1.0000 mg | ORAL_TABLET | Freq: Once | ORAL | Status: DC

## 2019-06-15 MED ORDER — ALPRAZOLAM 0.5 MG PO TABS
0.5000 mg | ORAL_TABLET | Freq: Two times a day (BID) | ORAL | Status: DC | PRN
  Administered 2019-06-15 – 2019-06-19 (×3): 0.5 mg via ORAL
  Filled 2019-06-15 (×5): qty 1

## 2019-06-15 MED ORDER — SODIUM POLYSTYRENE SULFONATE 15 GM/60ML PO SUSP
30.0000 g | Freq: Once | ORAL | Status: DC
  Filled 2019-06-15: qty 120

## 2019-06-15 MED ORDER — PREGABALIN 75 MG PO CAPS
300.0000 mg | ORAL_CAPSULE | Freq: Every day | ORAL | Status: DC
  Administered 2019-06-15 – 2019-06-19 (×5): 300 mg via ORAL
  Filled 2019-06-15 (×5): qty 4

## 2019-06-15 MED ORDER — TRAZODONE HCL 50 MG PO TABS
100.0000 mg | ORAL_TABLET | Freq: Every evening | ORAL | Status: DC | PRN
  Administered 2019-06-15 – 2019-06-19 (×2): 100 mg via ORAL
  Filled 2019-06-15 (×3): qty 1
  Filled 2019-06-15: qty 2

## 2019-06-15 MED ORDER — ONDANSETRON HCL 4 MG/2ML IJ SOLN
4.0000 mg | Freq: Four times a day (QID) | INTRAMUSCULAR | Status: DC | PRN
  Administered 2019-06-15 – 2019-06-16 (×7): 4 mg via INTRAVENOUS
  Filled 2019-06-15 (×7): qty 2

## 2019-06-15 MED ORDER — ONDANSETRON HCL 4 MG PO TABS
4.0000 mg | ORAL_TABLET | Freq: Four times a day (QID) | ORAL | Status: DC | PRN
  Filled 2019-06-15: qty 1

## 2019-06-15 MED ORDER — FENTANYL CITRATE (PF) 100 MCG/2ML IJ SOLN
25.0000 ug | INTRAMUSCULAR | Status: DC | PRN
  Administered 2019-06-15 – 2019-06-16 (×5): 25 ug via INTRAVENOUS
  Filled 2019-06-15 (×5): qty 2

## 2019-06-15 MED ORDER — DEXTROSE 5 % IV SOLN
INTRAVENOUS | Status: DC
  Administered 2019-06-15 – 2019-06-19 (×12): via INTRAVENOUS

## 2019-06-15 MED ORDER — ACETAMINOPHEN 325 MG PO TABS
650.0000 mg | ORAL_TABLET | Freq: Four times a day (QID) | ORAL | Status: DC | PRN
  Administered 2019-06-15 – 2019-06-16 (×3): 650 mg via ORAL
  Filled 2019-06-15 (×3): qty 2

## 2019-06-15 MED ORDER — HYDROMORPHONE HCL 1 MG/ML PO LIQD
1.0000 mg | Freq: Once | ORAL | Status: DC
  Administered 2019-06-15: 1 mg via ORAL

## 2019-06-15 MED ORDER — INSULIN (MYXREDLIN) INFUSION FOR HYPERTRIGLYCERIDEMIA
0.1000 [IU]/kg/h | INTRAVENOUS | Status: DC
  Filled 2019-06-15 (×2): qty 100

## 2019-06-15 MED ORDER — ENOXAPARIN SODIUM 40 MG/0.4ML ~~LOC~~ SOLN
40.0000 mg | SUBCUTANEOUS | Status: DC
  Administered 2019-06-16 – 2019-06-20 (×5): 40 mg via SUBCUTANEOUS
  Filled 2019-06-15 (×5): qty 0.4

## 2019-06-15 MED ORDER — INSULIN (MYXREDLIN) INFUSION FOR HYPERTRIGLYCERIDEMIA
0.1000 [IU]/kg/h | INTRAVENOUS | Status: DC
  Administered 2019-06-15 – 2019-06-16 (×4): 0.1 [IU]/kg/h via INTRAVENOUS
  Administered 2019-06-16: 0.05 [IU]/kg/h via INTRAVENOUS
  Administered 2019-06-18 – 2019-06-19 (×4): 0.1 [IU]/kg/h via INTRAVENOUS
  Filled 2019-06-15 (×14): qty 100

## 2019-06-15 MED ORDER — HYDROMORPHONE HCL 1 MG/ML IJ SOLN
INTRAMUSCULAR | Status: AC
  Administered 2019-06-15: 1 mg via INTRAVENOUS
  Filled 2019-06-15: qty 1

## 2019-06-15 MED ORDER — ONDANSETRON HCL 4 MG/2ML IJ SOLN
4.0000 mg | Freq: Once | INTRAMUSCULAR | Status: AC
  Administered 2019-06-15: 4 mg via INTRAVENOUS

## 2019-06-15 MED ORDER — HYDROMORPHONE HCL 1 MG/ML IJ SOLN
0.5000 mg | INTRAMUSCULAR | Status: DC | PRN
  Administered 2019-06-15: 06:00:00 1 mg via INTRAVENOUS
  Administered 2019-06-15 – 2019-06-16 (×4): 0.5 mg via INTRAVENOUS
  Filled 2019-06-15 (×3): qty 1

## 2019-06-15 MED ORDER — SODIUM POLYSTYRENE SULFONATE 15 GM/60ML PO SUSP
15.0000 g | Freq: Once | ORAL | Status: DC
  Filled 2019-06-15: qty 60

## 2019-06-15 MED ORDER — DOCUSATE SODIUM 100 MG PO CAPS
100.0000 mg | ORAL_CAPSULE | Freq: Two times a day (BID) | ORAL | Status: DC
  Filled 2019-06-15 (×8): qty 1

## 2019-06-15 MED ORDER — ACETAMINOPHEN 650 MG RE SUPP
650.0000 mg | Freq: Four times a day (QID) | RECTAL | Status: DC | PRN
  Filled 2019-06-15: qty 1

## 2019-06-15 MED ORDER — PANCRELIPASE (LIP-PROT-AMYL) 36000-114000 UNITS PO CPEP
180000.0000 [IU] | ORAL_CAPSULE | Freq: Three times a day (TID) | ORAL | Status: DC
  Administered 2019-06-19 – 2019-06-20 (×4): 180000 [IU] via ORAL
  Filled 2019-06-15 (×15): qty 5

## 2019-06-15 MED ORDER — OMEGA-3-ACID ETHYL ESTERS 1 G PO CAPS
3.0000 g | ORAL_CAPSULE | Freq: Two times a day (BID) | ORAL | Status: DC
  Administered 2019-06-15 – 2019-06-20 (×5): 3 g via ORAL
  Filled 2019-06-15 (×10): qty 3

## 2019-06-15 MED ORDER — ONDANSETRON HCL 4 MG/2ML IJ SOLN
INTRAMUSCULAR | Status: AC
  Filled 2019-06-15: qty 2

## 2019-06-15 NOTE — ED Provider Notes (Signed)
[facility] Emergency Department Provider Note    First MD Initiated Contact with Patient 06/15/19 [phone]     (approximate)  I have reviewed the triage vital signs and the nursing notes.   HISTORY  Chief Complaint Abdominal Pain    HPI Sergio [name] is a 43 y.o. male with below list of previous medical conditions including hypertriglyceridemia multiple episodes of pancreatitis presents to the emergency department secondary to 10 out of 10 epigastric abdominal pain for approximately 2 days.  Patient admits to nausea however no vomiting.  Patient denies any fever.  Patient states that symptoms are consistent with previous episodes of pancreatitis.        Past Medical History:  Diagnosis Date  . Anxiety   . Chicken pox   . Depression   . Diabetes mellitus    DM2  . Familial hypertriglyceridemia    severe  . Gastric ulcer 2009  . HTN (hypertension)   . Morbid obesity ([place])    s/p bariatric sleeve surgery 01/2015  . Recurrent acute pancreatitis    secondary to hypertriglyceridemia   . Sleep apnea 1999   uses CPAP    Patient Active Problem List   Diagnosis Date Noted  . Acute pancreatitis 01/15/2018  . Nasal sore 11/29/2017  . Hospital discharge follow-up 08/10/2016  . Sixth nerve palsy of left eye   . Pancreatic insufficiency 07/24/2016  . Hyponatremia 07/24/2016  . Encounter for preventive health examination 05/01/2016  . Depression, major, recurrent, moderate ([place]) 03/14/2016  . S/P bariatric surgery 02/01/2015  . S/P laparoscopic cholecystectomy 02/01/2015  . Diarrhea 05/29/2012  . Recurrent pancreatitis 05/29/2012  . Generalized anxiety disorder 04/29/2012  . Obesity (BMI 30-39.9) 04/29/2012  . DM (diabetes mellitus), type 2, uncontrolled w/neurologic complication ([place])   . Hypertriglyceridemia   . HTN (hypertension)   . Sleep apnea     Past Surgical History:  Procedure Laterality Date  . CHOLECYSTECTOMY    . LAPAROSCOPIC  GASTRIC SLEEVE RESECTION    . [place] SURGERY  2008  . TONSILLECTOMY  2003  . VASECTOMY      Prior to Admission medications   Medication Sig Start Date End Date Taking? Authorizing Provider  ALPRAZolam (XANAX) 0.5 MG tablet TAKE 1 TABLET BY MOUTH TWICE DAILY AS NEEDED FOR ANXIETY Patient taking differently: Take 0.5 mg by mouth 2 (two) times daily as needed for anxiety.  09/20/18  Yes Crecencio Mc, MD  diclofenac (VOLTAREN) 75 MG EC tablet TAKE ONE TABLET TWICE DAILY Patient taking differently: Take 75 mg by mouth at bedtime.  03/08/19  Yes Crecencio Mc, MD  insulin aspart (NOVOLOG) 100 UNIT/ML injection Inject 1 Dose into the skin See admin instructions. Per sliding scale 09/11/15  Yes [provider]  Insulin Glargine, 2 Unit Dial, (TOUJEO MAX SOLOSTAR) 300 UNIT/ML SOPN Inject 90 Units into the skin at bedtime.   Yes [provider]  omega-3 acid ethyl esters (LOVAZA) 1 g capsule Take 2 capsules (2 g total) by mouth 2 (two) times daily. Patient taking differently: Take 3 g by mouth 2 (two) times daily.  05/23/17  Yes Gouru, Aruna, MD  Pancrelipase, Lip-Prot-Amyl, (ZENPEP) 40000-126000 units CPEP Take 1 capsule by mouth 5 (five) times daily. Before meals and  snacks Patient taking differently: Take 1-5 capsules by mouth as directed. 5 Capsule Before meals and 2 capsules with snacks 06/02/17  Yes Crecencio Mc, MD  pregabalin (LYRICA) 300 MG capsule Take 300 mg by mouth at  bedtime.    Yes [provider]  traZODone (DESYREL) 100 MG tablet Take 2 tablets (200 mg total) by mouth daily as needed for sleep. Patient taking differently: Take 100 mg by mouth at bedtime as needed for sleep.  07/11/18  Yes Crecencio Mc, MD  TRULICITY 1.5 0000000 SOPN Inject 1.5 mg into the skin every Monday.  05/17/18  Yes [provider]    Allergies Codeine and Iodinated diagnostic agents  Family History  Adopted: Yes  Problem Relation Age of Onset  . Hypertension  Mother   . Hyperlipidemia Mother   . Hypertension Father   . Hyperlipidemia Father     Social History Social History   Tobacco Use  . Smoking status: Never Smoker  . Smokeless tobacco: Never Used  Substance Use Topics  . Alcohol use: No    Frequency: Never  . Drug use: No    Review of Systems Constitutional: No fever/chills Eyes: No visual changes. ENT: No sore throat. Cardiovascular: Denies chest pain. Respiratory: Denies shortness of breath. Gastrointestinal: Positive for abdominal pain and nausea. no diarrhea.  No constipation. Genitourinary: Negative for dysuria. Musculoskeletal: Negative for neck pain.  Negative for back pain. Integumentary: Negative for rash. Neurological: Negative for headaches, focal weakness or numbness.  ____________________________________________   PHYSICAL EXAM:  VITAL SIGNS: ED Triage Vitals  Enc Vitals Group     BP 06/14/19 2325 (!) 160/90     Pulse Rate 06/14/19 2325 98     Resp 06/14/19 2325 17     Temp 06/14/19 2325 99.1 F (37.3 C)     Temp Source 06/14/19 2325 Oral     SpO2 06/14/19 2325 98 %     Weight 06/14/19 2327 113.4 kg (250 lb)     Height 06/14/19 2327 1.829 m (6')     Head Circumference --      Peak Flow --      Pain Score 06/14/19 2335 9     Pain Loc --      Pain Edu? --      Excl. in [place]? --     Constitutional: Alert and oriented.  Apparent discomfort Eyes: Conjunctivae are normal.  Mouth/Throat: Mucous membranes are moist. Neck: No stridor.  No meningeal signs.   Cardiovascular: Normal rate, regular rhythm. Good peripheral circulation. Grossly normal heart sounds. Respiratory: Normal respiratory effort.  No retractions. Gastrointestinal: Epigastric tenderness to palpation.  No distention.  Musculoskeletal: No lower extremity tenderness nor edema. No gross deformities of extremities. Neurologic:  Normal speech and language. No gross focal neurologic deficits are appreciated.  Skin:  Skin is warm, dry and  intact. Psychiatric: Mood and affect are normal. Speech and behavior are normal.  ____________________________________________   LABS (all labs ordered are listed, but only abnormal results are displayed)  Labs Reviewed  LIPASE, BLOOD - Abnormal; Notable for the following components:      Result Value   Lipase 125 (*)    All other components within normal limits  COMPREHENSIVE METABOLIC PANEL - Abnormal; Notable for the following components:   Sodium 132 (*)    Potassium 6.7 (*)    CO2 20 (*)    Glucose, Bld 151 (*)    Creatinine, Ser <0.30 (*)    AST 423 (*)    ALT 535 (*)    Total Bilirubin 9.4 (*)    All other components within normal limits  CBC - Abnormal; Notable for the following components:   MCHC 37.1 (*)    All  other components within normal limits  URINALYSIS, COMPLETE (UACMP) WITH MICROSCOPIC  TRIGLYCERIDES   ____________________________________________  EKG  ED ECG REPORT I, [place] Chantille Navarrete, the attending physician, personally viewed and interpreted this ECG.   Date: 06/15/2019  EKG Time: 11:40 PM  Rate: 94  Rhythm: Normal sinus rhythm  Axis: Normal  Intervals: Normal  ST&T Change: None   Procedures   ____________________________________________   INITIAL IMPRESSION / MDM / ASSESSMENT AND PLAN / ED COURSE  As part of my medical decision making, I reviewed the following data within the electronic MEDICAL RECORD NUMBER   43 year old male presenting with above-stated history and physical exam secondary to epigastric abdominal pain consistent with recurrence of pancreatitis most likely secondary to hypertriglyceridemia.  Patient's lipase elevated 125 liver enzymes also elevated with an ALT of 535 AST 423 and a bilirubin of 9.4.  Right upper quadrant ultrasound pending.  Patient discussed with Dr. Marcille Blanco for hospital admission further evaluation and management.   Clinical Course as of Jun 14 414  Fri Jun 15, 2019  0323 Potassium(!!): 6.7 [RB]  0323  WBC: 4.9 [RB]    Clinical Course User Index [RB] Gregor Hams, MD     ____________________________________________  FINAL CLINICAL IMPRESSION(S) / ED DIAGNOSES  Final diagnoses:  RUQ pain  Acute pancreatitis, unspecified complication status, unspecified pancreatitis type  Elevated liver enzymes     MEDICATIONS GIVEN DURING THIS VISIT:  Medications  HYDROmorphone (DILAUDID) 1 MG/ML injection (has no administration in time range)  HYDROmorphone (DILAUDID) tablet 1 mg (has no administration in time range)  ondansetron (ZOFRAN) injection 4 mg (4 mg Intravenous Given 06/15/19 0344)     ED Discharge Orders    None      *Please note:  Sergio [name] was evaluated in Emergency Department on 06/15/2019 for the symptoms described in the history of present illness. He was evaluated in the context of the global COVID-19 pandemic, which necessitated consideration that the patient might be at risk for infection with the SARS-CoV-2 virus that causes COVID-19. Institutional protocols and algorithms that pertain to the evaluation of patients at risk for COVID-19 are in a state of rapid change based on information released by regulatory bodies including the CDC and federal and state organizations. These policies and algorithms were followed during the patient's care in the ED.  Some ED evaluations and interventions may be delayed as a result of limited staffing during the pandemic.*  Note:  This document was prepared using Dragon voice recognition software and may include unintentional dictation errors.   Gregor Hams, MD 06/15/19 [phone]

## 2019-06-15 NOTE — Progress Notes (Signed)
Patient was seen and examined reviewed the chart.  Still in the emergency department awaiting for bed.  Repeat potassium ordered patient is refusing to take Kayexalate until he get the new lab result Pain is well controlled with IV Dilaudid.  Treatment plan of care discussed with the patient.  He is aware of the plan

## 2019-06-15 NOTE — ED Notes (Signed)
Contacted lab about an add on for triglycerides.

## 2019-06-15 NOTE — ED Notes (Signed)
Green top redrawn as per labs request 

## 2019-06-15 NOTE — ED Notes (Signed)
covid swab sent to lab

## 2019-06-15 NOTE — ED Notes (Signed)
Attempted report to ICU.

## 2019-06-15 NOTE — ED Notes (Signed)
Lab reports needing green top redrawn; pt reports that he "always has problems with his blood clotting due to his lipemia"

## 2019-06-15 NOTE — ED Notes (Signed)
Verified with pt he is not allergic to dilaudid. Pt states that his pain is usually treated with dilaudid.

## 2019-06-15 NOTE — H&P (Signed)
Sergio [name] is an 43 y.o. male.   Chief Complaint: Abdominal pain HPI: The patient with past medical history of hypertriglyceridemia, diabetes and sleep apnea presents to the emergency department complaining of abdominal pain.  The patient is well-known to our service and is well aware of the symptoms of pancreatitis.  He is also aware when his triglycerides are elevated due to the eruption of xanthomas and generalized stiffness of his joints.  Laboratory evaluation revealed triglycerides of 354.  Lipase elevated at 125 as well as elevations of AST and ALT.  The patient was given IV pain medication as well as antiemetics prior to the emergency department staff called the hospitalist service for admission.  Past Medical History:  Diagnosis Date  . Anxiety   . Chicken pox   . Depression   . Diabetes mellitus    DM2  . Familial hypertriglyceridemia    severe  . Gastric ulcer 2009  . HTN (hypertension)   . Morbid obesity ([place])    s/p bariatric sleeve surgery 01/2015  . Recurrent acute pancreatitis    secondary to hypertriglyceridemia   . Sleep apnea 1999   uses CPAP    Past Surgical History:  Procedure Laterality Date  . CHOLECYSTECTOMY    . LAPAROSCOPIC GASTRIC SLEEVE RESECTION    . [place] SURGERY  2008  . TONSILLECTOMY  2003  . VASECTOMY      Family History  Adopted: Yes  Problem Relation Age of Onset  . Hypertension Mother   . Hyperlipidemia Mother   . Hypertension Father   . Hyperlipidemia Father    Social History:  reports that he has never smoked. He has never used smokeless tobacco. He reports that he does not drink alcohol or use drugs.  Allergies:  Allergies  Allergen Reactions  . Codeine Anaphylaxis  . Iodinated Diagnostic Agents Other (See Comments) and Anaphylaxis    Kidneys stop working KIDNEYS SHUT DOWN    Prior to Admission medications   Medication Sig Start Date End Date Taking? Authorizing Provider  ALPRAZolam (XANAX) 0.5 MG tablet TAKE 1  TABLET BY MOUTH TWICE DAILY AS NEEDED FOR ANXIETY Patient taking differently: Take 0.5 mg by mouth 2 (two) times daily as needed for anxiety.  09/20/18  Yes Crecencio Mc, MD  diclofenac (VOLTAREN) 75 MG EC tablet TAKE ONE TABLET TWICE DAILY Patient taking differently: Take 75 mg by mouth at bedtime.  03/08/19  Yes Crecencio Mc, MD  insulin aspart (NOVOLOG) 100 UNIT/ML injection Inject 1 Dose into the skin See admin instructions. Per sliding scale 09/11/15  Yes [provider]  Insulin Glargine, 2 Unit Dial, (TOUJEO MAX SOLOSTAR) 300 UNIT/ML SOPN Inject 90 Units into the skin at bedtime.   Yes [provider]  omega-3 acid ethyl esters (LOVAZA) 1 g capsule Take 2 capsules (2 g total) by mouth 2 (two) times daily. Patient taking differently: Take 3 g by mouth 2 (two) times daily.  05/23/17  Yes Gouru, Aruna, MD  Pancrelipase, Lip-Prot-Amyl, (ZENPEP) 40000-126000 units CPEP Take 1 capsule by mouth 5 (five) times daily. Before meals and  snacks Patient taking differently: Take 1-5 capsules by mouth as directed. 5 Capsule Before meals and 2 capsules with snacks 06/02/17  Yes Crecencio Mc, MD  pregabalin (LYRICA) 300 MG capsule Take 300 mg by mouth at bedtime.    Yes [provider]  traZODone (DESYREL) 100 MG tablet Take 2 tablets (200 mg total) by mouth daily as needed for sleep. Patient taking  differently: Take 100 mg by mouth at bedtime as needed for sleep.  07/11/18  Yes Crecencio Mc, MD  TRULICITY 1.5 0000000 SOPN Inject 1.5 mg into the skin every Monday.  05/17/18  Yes [provider]     Results for orders placed or performed during the hospital encounter of 06/15/19 (from the past 48 hour(s))  Lipase, blood     Status: Abnormal   Collection Time: 06/14/19 11:27 PM  Result Value Ref Range   Lipase 125 (H) 11 - 51 U/L    Comment: Performed at [facility], [address] [place], [place] [address]  Comprehensive metabolic panel     Status:  Abnormal   Collection Time: 06/14/19 11:27 PM  Result Value Ref Range   Sodium 132 (L) 135 - 145 mmol/L    Comment: SPECIMEN HEMOLYZED. HEMOLYSIS MAY AFFECT INTEGRITY OF RESULTS. POST-ULTRACENTRIFUGATION [place]    Potassium 6.7 (HH) 3.5 - 5.1 mmol/L    Comment: SPECIMEN HEMOLYZED. HEMOLYSIS MAY AFFECT INTEGRITY OF RESULTS. POST-ULTRACENTRIFUGATION CRITICAL RESULT CALLED TO, READ BACK BY AND VERIFIED WITH: LISA THOMPSON ON 06/15/19 AT 0130 [facility]    Chloride 99 98 - 111 mmol/L    Comment: POST-ULTRACENTRIFUGATION   CO2 20 (L) 22 - 32 mmol/L    Comment: POST-ULTRACENTRIFUGATION   Glucose, Bld 151 (H) 70 - 99 mg/dL    Comment: SPECIMEN HEMOLYZED. HEMOLYSIS MAY AFFECT INTEGRITY OF RESULTS. POST-ULTRACENTRIFUGATION    BUN 7 6 - 20 mg/dL   Creatinine, Ser <0.30 (L) 0.61 - 1.24 mg/dL   Calcium 8.9 8.9 - 10.3 mg/dL    Comment: POST-ULTRACENTRIFUGATION   Total Protein 6.5 6.5 - 8.1 g/dL    Comment: POST-ULTRACENTRIFUGATION   Albumin 4.2 3.5 - 5.0 g/dL    Comment: POST-ULTRACENTRIFUGATION   AST 423 (H) 15 - 41 U/L    Comment: SPECIMEN HEMOLYZED. HEMOLYSIS MAY AFFECT INTEGRITY OF RESULTS. POST-ULTRACENTRIFUGATION    ALT 535 (H) 0 - 44 U/L    Comment: SPECIMEN HEMOLYZED. HEMOLYSIS MAY AFFECT INTEGRITY OF RESULTS. POST-ULTRACENTRIFUGATION    Alkaline Phosphatase 83 38 - 126 U/L   Total Bilirubin 9.4 (H) 0.3 - 1.2 mg/dL    Comment: SPECIMEN HEMOLYZED. HEMOLYSIS MAY AFFECT INTEGRITY OF RESULTS. POST-ULTRACENTRIFUGATION    GFR calc non Af Amer NOT CALCULATED >60 mL/min   GFR calc Af Amer NOT CALCULATED >60 mL/min   Anion gap 13 5 - 15    Comment: Performed at [facility], [address] [place], [place] [address]  CBC     Status: Abnormal   Collection Time: 06/14/19 11:27 PM  Result Value Ref Range   WBC 4.9 4.0 - 10.5 K/uL   RBC 5.20 4.22 - 5.81 MIL/uL   Hemoglobin 15.8 13.0 - 17.0 g/dL   HCT 42.6 39.0 - 52.0 %   MCV 81.9 80.0 - 100.0 fL   MCH 30.4 26.0 - 34.0 pg   MCHC  37.1 (H) 30.0 - 36.0 g/dL   RDW 15.1 11.5 - 15.5 %   Platelets 178 150 - 400 K/uL   nRBC 0.0 0.0 - 0.2 %    Comment: Performed at [facility], [address] [place], [place] [address]  Triglycerides     Status: Abnormal   Collection Time: 06/14/19 11:27 PM  Result Value Ref Range   Triglycerides 354 (H) <150 mg/dL    Comment: SPECIMEN HEMOLYZED. HEMOLYSIS MAY AFFECT INTEGRITY OF RESULTS. POST-ULTRACENTRIFUGATION [facility] Performed at [facility], [address] [place],  [address]  SARS Coronavirus 2 [facility] order, Performed in [facility] hospital lab) Nasopharyngeal Nasopharyngeal Swab     Status: None   Collection Time: 06/15/19  5:13 AM   Specimen: Nasopharyngeal Swab  Result Value Ref Range   SARS Coronavirus 2 NEGATIVE NEGATIVE    Comment: (NOTE) If result is NEGATIVE SARS-CoV-2 target nucleic acids are NOT DETECTED. The SARS-CoV-2 RNA is generally detectable in upper and lower  respiratory specimens during the acute phase of infection. The lowest  concentration of SARS-CoV-2 viral copies this assay can detect is 250  copies / mL. A negative result does not preclude SARS-CoV-2 infection  and should not be used as the sole basis for treatment or other  patient management decisions.  A negative result may occur with  improper specimen collection / handling, submission of specimen other  than nasopharyngeal swab, presence of viral mutation(s) within the  areas targeted by this assay, and inadequate number of viral copies  (<250 copies / mL). A negative result must be combined with clinical  observations, patient history, and epidemiological information. If result is POSITIVE SARS-CoV-2 target nucleic acids are DETECTED. The SARS-CoV-2 RNA is generally detectable in upper and lower  respiratory specimens dur ing the acute phase of infection.  Positive  results are indicative of active infection with SARS-CoV-2.  Clinical  correlation with patient  history and other diagnostic information is  necessary to determine patient infection status.  Positive results do  not rule out bacterial infection or co-infection with other viruses. If result is PRESUMPTIVE POSTIVE SARS-CoV-2 nucleic acids MAY BE PRESENT.   A presumptive positive result was obtained on the submitted specimen  and confirmed on repeat testing.  While 2019 novel coronavirus  (SARS-CoV-2) nucleic acids may be present in the submitted sample  additional confirmatory testing may be necessary for epidemiological  and / or clinical management purposes  to differentiate between  SARS-CoV-2 and other Sarbecovirus currently known to infect humans.  If clinically indicated additional testing with an alternate test  methodology [phone]) is advised. The SARS-CoV-2 RNA is generally  detectable in upper and lower respiratory sp ecimens during the acute  phase of infection. The expected result is Negative. Fact Sheet for Patients:  StrictlyIdeas.no Fact Sheet for Healthcare Providers: BankingDealers.co.za This test is not yet approved or cleared by the [place] FDA and has been authorized for detection and/or diagnosis of SARS-CoV-2 by FDA under an Emergency Use Authorization (EUA).  This EUA will remain in effect (meaning this test can be used) for the duration of the COVID-19 declaration under Section 564(b)(1) of the Act, 21 U.S.C. section 360bbb-3(b)(1), unless the authorization is terminated or revoked sooner. Performed at [facility], [address] [place], [place] [address]    US Abdomen Limited Ruq  Result Date: 06/15/2019 CLINICAL DATA:  Right upper quadrant pain for 2 days. Prior cholecystectomy. Elevated LFTs. EXAM: ULTRASOUND ABDOMEN LIMITED RIGHT UPPER QUADRANT COMPARISON:  CT abdomen and pelvis 01/15/2018. Abdominal ultrasound 11/02/2014. FINDINGS: Gallbladder: Surgically absent. Common bile duct:  Diameter: 6 mm with only a small portion of the common bile duct well visualized Liver: Dense appearance of the liver with diffusely increased echogenicity and decreased penetration limiting assessment for a mass or other focal abnormality. Portal vein is patent on color Doppler imaging with normal direction of blood flow towards the liver. Other: None. IMPRESSION: 1. Hepatic steatosis. 2. Prior cholecystectomy.  No biliary dilatation identified. Electronically Signed   By: Logan Bores M.D.   On: 06/15/2019 05:24  Review of Systems  Constitutional: Negative for chills and fever.  HENT: Negative for sore throat and tinnitus.   Eyes: Negative for blurred vision and redness.  Respiratory: Negative for cough and shortness of breath.   Cardiovascular: Negative for chest pain, palpitations, orthopnea and PND.  Gastrointestinal: Positive for abdominal pain. Negative for diarrhea, nausea and vomiting.  Genitourinary: Negative for dysuria, frequency and urgency.  Musculoskeletal: Negative for joint pain and myalgias.  Skin: Negative for rash.       No lesions  Neurological: Negative for speech change, focal weakness and weakness.  Endo/Heme/Allergies: Does not bruise/bleed easily.       No temperature intolerance  Psychiatric/Behavioral: Negative for depression and suicidal ideas.    Blood pressure (!) 137/98, pulse 99, temperature 99.1 F (37.3 C), temperature source Oral, resp. rate 18, height 6' (1.829 m), weight 113.4 kg, SpO2 93 %. Physical Exam  Constitutional: He is oriented to person, place, and time. He appears well-developed and well-nourished. No distress.  HENT:  Head: Normocephalic and atraumatic.  Mouth/Throat: Oropharynx is clear and moist.  Eyes: Pupils are equal, round, and reactive to light. Conjunctivae and EOM are normal. No scleral icterus.  Neck: Normal range of motion. Neck supple. No JVD present. No tracheal deviation present. No thyromegaly present.  Cardiovascular:  Normal rate, regular rhythm and normal heart sounds. Exam reveals no gallop and no friction rub.  No murmur heard. Respiratory: Effort normal and breath sounds normal. No respiratory distress.  GI: Soft. Bowel sounds are normal. He exhibits no distension. There is no abdominal tenderness.  Genitourinary:    Genitourinary Comments: Deferred   Musculoskeletal: Normal range of motion.        General: No edema.  Lymphadenopathy:    He has no cervical adenopathy.  Neurological: He is alert and oriented to person, place, and time. No cranial nerve deficit.  Skin: Skin is warm and dry. Rash noted. No erythema.  Sparsely dispersed erythematous papules with yellow comedones on extremities and torso  Psychiatric: He has a normal mood and affect. His behavior is normal. Judgment and thought content normal.     Assessment/Plan This is a 43 year old male admitted for triglyceride induced pancreatitis. 1.  Pancreatitis: Secondary to hypertriglyceridemia.  Start insulin drip as well as D5.  Continue to monitor triglycerides.  Fentanyl as needed for severe pain.  Monitor in stepdown unit of ICU. 2.  Hypertriglyceridemia: Triglycerides usually much higher.  Recheck.  The patient has familial hypercholesterolemia.  Consider addition of Repatha. Continue Lovaza and pancreatic enzymes.   3.  Diabetes mellitus type 2: Continue basal insulin therapy as well as sliding scale insulin.  Continue Lyrica for neuropathy. 4.  Hypertension: Controlled; continue to monitor 5.  DVT prophylaxis: Lovenox 6.  GI prophylaxis: None The patient is a full code.  I personally spent 45 minutes in critical care time with this patient.  Harrie Foreman, MD 06/15/2019, 7:24 AM

## 2019-06-15 NOTE — ED Notes (Signed)
Explained delay to pt.

## 2019-06-15 NOTE — ED Notes (Signed)
Pt states he wants to know potassium level before taking next dose of kayexalate. Dose moved to 1700 as potassium level drawn at 1616.

## 2019-06-15 NOTE — ED Notes (Signed)
ED TO INPATIENT HANDOFF REPORT  ED Nurse Name and Phone #: Metta Clines [phone]  S Name/Age/Gender Sergio [name] 43 y.o. male Room/Bed: ED35A/ED35A  Code Status   Code Status: Full Code  Home/SNF/Other Home Patient oriented to: self, place, time and situation Is this baseline? Yes   Triage Complete: Triage complete  Chief Complaint Abdominal pain  Triage Note Pt arrives to ED via POV from home with c/o epigastric abdominal pain with radiation into the bilateral lower back x2 days. Pt reports nausea, but denies vomiting or diarrhea. Pt also denies c/o CP or SHOB. Pt reports previous h/x of pancreatitis and reports s/x's feel similar. Pt is A&O, in NAD; RR even, regular, and unlabored.   Allergies Allergies  Allergen Reactions  . Iodinated Diagnostic Agents Other (See Comments) and Anaphylaxis    Kidneys stop working KIDNEYS SHUT DOWN    Level of Care/Admitting Diagnosis ED Disposition    ED Disposition Condition [facility] Area: [place] [[id number]]  Level of Care: Stepdown [14]  Covid Evaluation: Asymptomatic Screening Protocol (No Symptoms)  Diagnosis: Pancreatitis [id number]  Admitting Physician: Harrie Foreman [id number]  Attending Physician: Harrie Foreman [id number]  Estimated length of stay: past midnight tomorrow  Certification:: I certify this patient will need inpatient services for at least 2 midnights  PT Class (Do Not Modify): Inpatient [101]  PT Acc Code (Do Not Modify): Private [1]       B Medical/Surgery History Past Medical History:  Diagnosis Date  . Anxiety   . Chicken pox   . Depression   . Diabetes mellitus    DM2  . Familial hypertriglyceridemia    severe  . Gastric ulcer 2009  . HTN (hypertension)   . Morbid obesity ([place])    s/p bariatric sleeve surgery 01/2015  . Recurrent acute pancreatitis    secondary to hypertriglyceridemia   . Sleep apnea 1999   uses CPAP   Past Surgical History:   Procedure Laterality Date  . CHOLECYSTECTOMY    . LAPAROSCOPIC GASTRIC SLEEVE RESECTION    . [place] SURGERY  2008  . TONSILLECTOMY  2003  . VASECTOMY       A IV Location/Drains/Wounds Patient Lines/Drains/Airways Status   Active Line/Drains/Airways    Name:   Placement date:   Placement time:   Site:   Days:   Peripheral IV 06/15/19 Right Wrist   06/15/19    0347    Wrist   less than 1   Peripheral IV 06/15/19 Left Arm   06/15/19    0902    Arm   less than 1          Intake/Output Last 24 hours  Intake/Output Summary (Last 24 hours) at 06/15/2019 1644 Last data filed at 06/15/2019 1615 Gross per 24 hour  Intake -  Output 700 ml  Net -700 ml    Labs/Imaging Results for orders placed or performed during the hospital encounter of 06/15/19 (from the past 48 hour(s))  Lipase, blood     Status: Abnormal   Collection Time: 06/14/19 11:27 PM  Result Value Ref Range   Lipase 125 (H) 11 - 51 U/L    Comment: Performed at [facility], [address] [place], [place] [address]  Comprehensive metabolic panel     Status: Abnormal   Collection Time: 06/14/19 11:27 PM  Result Value Ref Range   Sodium 132 (L) 135 - 145 mmol/L    Comment: SPECIMEN HEMOLYZED. HEMOLYSIS  MAY AFFECT INTEGRITY OF RESULTS. POST-ULTRACENTRIFUGATION [place]    Potassium 6.7 (HH) 3.5 - 5.1 mmol/L    Comment: SPECIMEN HEMOLYZED. HEMOLYSIS MAY AFFECT INTEGRITY OF RESULTS. POST-ULTRACENTRIFUGATION CRITICAL RESULT CALLED TO, READ BACK BY AND VERIFIED WITH: LISA THOMPSON ON 06/15/19 AT 0130 [facility]    Chloride 99 98 - 111 mmol/L    Comment: POST-ULTRACENTRIFUGATION   CO2 20 (L) 22 - 32 mmol/L    Comment: POST-ULTRACENTRIFUGATION   Glucose, Bld 151 (H) 70 - 99 mg/dL    Comment: SPECIMEN HEMOLYZED. HEMOLYSIS MAY AFFECT INTEGRITY OF RESULTS. POST-ULTRACENTRIFUGATION    BUN 7 6 - 20 mg/dL   Creatinine, Ser <0.30 (L) 0.61 - 1.24 mg/dL   Calcium 8.9 8.9 - 10.3 mg/dL    Comment: POST-ULTRACENTRIFUGATION    Total Protein 6.5 6.5 - 8.1 g/dL    Comment: POST-ULTRACENTRIFUGATION   Albumin 4.2 3.5 - 5.0 g/dL    Comment: POST-ULTRACENTRIFUGATION   AST 423 (H) 15 - 41 U/L    Comment: SPECIMEN HEMOLYZED. HEMOLYSIS MAY AFFECT INTEGRITY OF RESULTS. POST-ULTRACENTRIFUGATION    ALT 535 (H) 0 - 44 U/L    Comment: SPECIMEN HEMOLYZED. HEMOLYSIS MAY AFFECT INTEGRITY OF RESULTS. POST-ULTRACENTRIFUGATION    Alkaline Phosphatase 83 38 - 126 U/L   Total Bilirubin 9.4 (H) 0.3 - 1.2 mg/dL    Comment: SPECIMEN HEMOLYZED. HEMOLYSIS MAY AFFECT INTEGRITY OF RESULTS. POST-ULTRACENTRIFUGATION    GFR calc non Af Amer NOT CALCULATED >60 mL/min   GFR calc Af Amer NOT CALCULATED >60 mL/min   Anion gap 13 5 - 15    Comment: Performed at [facility], [address] [place], [place] [address]  CBC     Status: Abnormal   Collection Time: 06/14/19 11:27 PM  Result Value Ref Range   WBC 4.9 4.0 - 10.5 K/uL   RBC 5.20 4.22 - 5.81 MIL/uL   Hemoglobin 15.8 13.0 - 17.0 g/dL   HCT 42.6 39.0 - 52.0 %   MCV 81.9 80.0 - 100.0 fL   MCH 30.4 26.0 - 34.0 pg   MCHC 37.1 (H) 30.0 - 36.0 g/dL   RDW 15.1 11.5 - 15.5 %   Platelets 178 150 - 400 K/uL   nRBC 0.0 0.0 - 0.2 %    Comment: Performed at [facility], [address] [place], [place] [address]  Triglycerides     Status: Abnormal   Collection Time: 06/14/19 11:27 PM  Result Value Ref Range   Triglycerides 5,171 (H) <150 mg/dL    Comment: SPECIMEN HEMOLYZED. HEMOLYSIS MAY AFFECT INTEGRITY OF RESULTS. [facility] CALLED TO AMBER PAYNE @1606  06/15/19 MJU Performed at [facility], [address] [place], [place] [address] CORRECTED ON 09/04 AT [id number]: PREVIOUSLY REPORTED AS 354 SPECIMEN HEMOLYZED. HEMOLYSIS MAY AFFECT INTEGRITY OF RESULTS. POST ULTRACENTRIFUGATION [facility]   SARS Coronavirus 2 [facility] order, Performed in [facility] hospital lab) Nasopharyngeal Nasopharyngeal Swab     Status: None   Collection Time: 06/15/19  5:13 AM   Specimen:  Nasopharyngeal Swab  Result Value Ref Range   SARS Coronavirus 2 NEGATIVE NEGATIVE    Comment: (NOTE) If result is NEGATIVE SARS-CoV-2 target nucleic acids are NOT DETECTED. The SARS-CoV-2 RNA is generally detectable in upper and lower  respiratory specimens during the acute phase of infection. The lowest  concentration of SARS-CoV-2 viral copies this assay can detect is 250  copies / mL. A negative result does not preclude SARS-CoV-2 infection  and should not be used as the sole basis for treatment or  other  patient management decisions.  A negative result may occur with  improper specimen collection / handling, submission of specimen other  than nasopharyngeal swab, presence of viral mutation(s) within the  areas targeted by this assay, and inadequate number of viral copies  (<250 copies / mL). A negative result must be combined with clinical  observations, patient history, and epidemiological information. If result is POSITIVE SARS-CoV-2 target nucleic acids are DETECTED. The SARS-CoV-2 RNA is generally detectable in upper and lower  respiratory specimens dur ing the acute phase of infection.  Positive  results are indicative of active infection with SARS-CoV-2.  Clinical  correlation with patient history and other diagnostic information is  necessary to determine patient infection status.  Positive results do  not rule out bacterial infection or co-infection with other viruses. If result is PRESUMPTIVE POSTIVE SARS-CoV-2 nucleic acids MAY BE PRESENT.   A presumptive positive result was obtained on the submitted specimen  and confirmed on repeat testing.  While 2019 novel coronavirus  (SARS-CoV-2) nucleic acids may be present in the submitted sample  additional confirmatory testing may be necessary for epidemiological  and / or clinical management purposes  to differentiate between  SARS-CoV-2 and other Sarbecovirus currently known to infect humans.  If clinically indicated  additional testing with an alternate test  methodology [phone]) is advised. The SARS-CoV-2 RNA is generally  detectable in upper and lower respiratory sp ecimens during the acute  phase of infection. The expected result is Negative. Fact Sheet for Patients:  StrictlyIdeas.no Fact Sheet for Healthcare Providers: BankingDealers.co.za This test is not yet approved or cleared by the [place] FDA and has been authorized for detection and/or diagnosis of SARS-CoV-2 by FDA under an Emergency Use Authorization (EUA).  This EUA will remain in effect (meaning this test can be used) for the duration of the COVID-19 declaration under Section 564(b)(1) of the Act, 21 U.S.C. section 360bbb-3(b)(1), unless the authorization is terminated or revoked sooner. Performed at [facility], [address] [place], [place] [address]   Lipase, blood     Status: Abnormal   Collection Time: 06/15/19  9:23 AM  Result Value Ref Range   Lipase 63 (H) 11 - 51 U/L    Comment: HEMOLYSIS AT THIS LEVEL MAY AFFECT RESULT Performed at [facility], [address] [place], [place] [address]   Comprehensive metabolic panel     Status: Abnormal   Collection Time: 06/15/19  9:23 AM  Result Value Ref Range   Sodium 133 (L) 135 - 145 mmol/L    Comment: HEMOLYSIS AT THIS LEVEL MAY AFFECT RESULT POST-ULTRACENTRIFUGATION    Potassium 5.6 (H) 3.5 - 5.1 mmol/L    Comment: HEMOLYSIS AT THIS LEVEL MAY AFFECT RESULT POST-ULTRACENTRIFUGATION    Chloride 99 98 - 111 mmol/L    Comment: HEMOLYSIS AT THIS LEVEL MAY AFFECT RESULT POST-ULTRACENTRIFUGATION    CO2 21 (L) 22 - 32 mmol/L    Comment: HEMOLYSIS AT THIS LEVEL MAY AFFECT RESULT POST-ULTRACENTRIFUGATION    Glucose, Bld 135 (H) 70 - 99 mg/dL    Comment: POST-ULTRACENTRIFUGATION HEMOLYSIS AT THIS LEVEL MAY AFFECT RESULT    BUN 8 6 - 20 mg/dL    Comment: HEMOLYSIS AT THIS LEVEL MAY AFFECT  RESULT POST-ULTRACENTRIFUGATION    Creatinine, Ser 0.76 0.61 - 1.24 mg/dL    Comment: HEMOLYSIS AT THIS LEVEL MAY AFFECT RESULT POST-ULTRACENTRIFUGATION    Calcium 8.9 8.9 - 10.3 mg/dL    Comment: HEMOLYSIS AT THIS LEVEL MAY AFFECT  RESULT POST-ULTRACENTRIFUGATION    Total Protein 6.7 6.5 - 8.1 g/dL    Comment: HEMOLYSIS AT THIS LEVEL MAY AFFECT RESULT POST-ULTRACENTRIFUGATION    Albumin 4.0 3.5 - 5.0 g/dL    Comment: HEMOLYSIS AT THIS LEVEL MAY AFFECT RESULT POST-ULTRACENTRIFUGATION    AST 313 (H) 15 - 41 U/L    Comment: RESULTS CONFIRMED BY MANUAL DILUTION HEMOLYSIS AT THIS LEVEL MAY AFFECT RESULT POST-ULTRACENTRIFUGATION    ALT 419 (H) 0 - 44 U/L    Comment: HEMOLYSIS AT THIS LEVEL MAY AFFECT RESULT POST-ULTRACENTRIFUGATION    Alkaline Phosphatase 80 38 - 126 U/L    Comment: HEMOLYSIS AT THIS LEVEL MAY AFFECT RESULT POST-ULTRACENTRIFUGATION    Total Bilirubin 8.4 (H) 0.3 - 1.2 mg/dL    Comment: HEMOLYSIS AT THIS LEVEL MAY AFFECT RESULT POST-ULTRACENTRIFUGATION    GFR calc non Af Amer >60 >60 mL/min   GFR calc Af Amer >60 >60 mL/min   Anion gap 13 5 - 15    Comment: Performed at [facility], [address] [place], [place] [address]  Triglycerides     Status: Abnormal   Collection Time: 06/15/19  9:23 AM  Result Value Ref Range   Triglycerides 5,391 (H) <150 mg/dL    Comment: RESULTS CONFIRMED BY MANUAL DILUTION HEMOLYSIS AT THIS LEVEL MAY AFFECT RESULT Performed at [facility], [address] [place], [place] [address]   Glucose, capillary     Status: Abnormal   Collection Time: 06/15/19 10:46 AM  Result Value Ref Range   Glucose-Capillary 140 (H) 70 - 99 mg/dL   Comment 1 Notify RN    Comment 2 Document in Chart   TSH     Status: None   Collection Time: 06/15/19 11:34 AM  Result Value Ref Range   TSH 1.906 0.350 - 4.500 uIU/mL    Comment: Performed by a 3rd Generation assay with a functional sensitivity of <=0.01 uIU/mL. HEMOLYSIS  AT THIS LEVEL MAY AFFECT RESULT POST-ULTRACENTRIFUGATION Performed at [facility], [place], [place] [address]   Glucose, capillary     Status: Abnormal   Collection Time: 06/15/19 12:09 PM  Result Value Ref Range   Glucose-Capillary 137 (H) 70 - 99 mg/dL  Glucose, capillary     Status: Abnormal   Collection Time: 06/15/19  1:34 PM  Result Value Ref Range   Glucose-Capillary 133 (H) 70 - 99 mg/dL  Glucose, capillary     Status: Abnormal   Collection Time: 06/15/19  2:32 PM  Result Value Ref Range   Glucose-Capillary 135 (H) 70 - 99 mg/dL   Comment 1 Notify RN   Glucose, capillary     Status: Abnormal   Collection Time: 06/15/19  3:46 PM  Result Value Ref Range   Glucose-Capillary 118 (H) 70 - 99 mg/dL   Comment 1 Notify RN    Comment 2 Document in Chart   Potassium     Status: None   Collection Time: 06/15/19  4:09 PM  Result Value Ref Range   Potassium 4.4 3.5 - 5.1 mmol/L    Comment: SPECIMEN HEMOLYZED. HEMOLYSIS MAY AFFECT INTEGRITY OF RESULTS. LIPEMIC SPECIMEN, RESULTS MAY BE AFFECTED. Performed at [facility], [address] [place], [place] [address]    US Abdomen Limited Ruq  Result Date: 06/15/2019 CLINICAL DATA:  Right upper quadrant pain for 2 days. Prior cholecystectomy. Elevated LFTs. EXAM: ULTRASOUND ABDOMEN LIMITED RIGHT UPPER QUADRANT COMPARISON:  CT abdomen and pelvis 01/15/2018. Abdominal ultrasound 11/02/2014. FINDINGS: Gallbladder:  Surgically absent. Common bile duct: Diameter: 6 mm with only a small portion of the common bile duct well visualized Liver: Dense appearance of the liver with diffusely increased echogenicity and decreased penetration limiting assessment for a mass or other focal abnormality. Portal vein is patent on color Doppler imaging with normal direction of blood flow towards the liver. Other: None. IMPRESSION: 1. Hepatic steatosis. 2. Prior cholecystectomy.  No biliary dilatation identified. Electronically  Signed   By: Logan Bores M.D.   On: 06/15/2019 05:24    Pending Labs Unresulted Labs (From admission, onward)    Start     Ordered   06/22/19 0500  Creatinine, serum  (enoxaparin (LOVENOX)    CrCl >/= 30 ml/min)  Weekly,   STAT    Comments: while on enoxaparin therapy    06/15/19 1032   06/16/19 0500  Comprehensive metabolic panel  Daily,   STAT     06/15/19 1520   06/16/19 0500  Triglycerides  Daily,   STAT     06/15/19 1520   06/15/19 1033  Hemoglobin A1c  Add-on,   AD     06/15/19 1032   06/14/19 2338  Urinalysis, Complete w Microscopic  ONCE - STAT,   STAT     06/14/19 2337          Vitals/Pain Today's Vitals   06/15/19 1300 06/15/19 1326 06/15/19 1407 06/15/19 1428  BP:    140/90  Pulse:    85  Resp:    15  Temp:    99 F (37.2 C)  TempSrc:    Oral  SpO2:    95%  Weight:      Height:      PainSc: 9  6  6       Isolation Precautions No active isolations  Medications Medications  pregabalin (LYRICA) capsule 300 mg (has no administration in time range)  omega-3 acid ethyl esters (LOVAZA) capsule 3 g (3 g Oral Given 06/15/19 1603)  Pancrelipase (Lip-Prot-Amyl) 40000-126000 units CPEP 1-5 capsule (has no administration in time range)  traZODone (DESYREL) tablet 100 mg (has no administration in time range)  ALPRAZolam (XANAX) tablet 0.5 mg (0.5 mg Oral Given 06/15/19 1040)  acetaminophen (TYLENOL) tablet 650 mg (has no administration in time range)    Or  acetaminophen (TYLENOL) suppository 650 mg (has no administration in time range)  ondansetron (ZOFRAN) tablet 4 mg ( Oral See Alternative 06/15/19 1405)    Or  ondansetron (ZOFRAN) injection 4 mg (4 mg Intravenous Given 06/15/19 1405)  docusate sodium (COLACE) capsule 100 mg (100 mg Oral Refused 06/15/19 1042)  enoxaparin (LOVENOX) injection 40 mg (has no administration in time range)  insulin (MYXREDLIN) 100 units/100 mL infusion for hypertriglyceridemia-induced pancreatitis (0.1 Units/kg/hr  113.4 kg Intravenous New  Bag/Given 06/15/19 1056)  dextrose 5 % solution ( Intravenous New Bag/Given 06/15/19 1058)  fentaNYL (SUBLIMAZE) injection 25 mcg (25 mcg Intravenous Given 06/15/19 1557)  HYDROmorphone (DILAUDID) injection 0.5 mg (0.5 mg Intravenous Given 06/15/19 1306)  sodium polystyrene (KAYEXALATE) 15 GM/60ML suspension 15 g (has no administration in time range)  ondansetron (ZOFRAN) injection 4 mg (4 mg Intravenous Given 06/15/19 0344)    Mobility walks Low fall risk   Focused Assessments  Hist pancreatitis; elevated lipase; elevated potassium      R Recommendations: See Admitting Provider Note  Report given to:   Additional Notes:  Insulin gtt

## 2019-06-16 ENCOUNTER — Inpatient Hospital Stay: Payer: BLUE CROSS/BLUE SHIELD

## 2019-06-16 DIAGNOSIS — R748 Abnormal levels of other serum enzymes: Secondary | ICD-10-CM

## 2019-06-16 DIAGNOSIS — K859 Acute pancreatitis without necrosis or infection, unspecified: Secondary | ICD-10-CM

## 2019-06-16 LAB — LIPASE, BLOOD: Lipase: 130 U/L — ABNORMAL HIGH (ref 11–51)

## 2019-06-16 LAB — GLUCOSE, CAPILLARY
Glucose-Capillary: 100 mg/dL — ABNORMAL HIGH (ref 70–99)
Glucose-Capillary: 105 mg/dL — ABNORMAL HIGH (ref 70–99)
Glucose-Capillary: 109 mg/dL — ABNORMAL HIGH (ref 70–99)
Glucose-Capillary: 114 mg/dL — ABNORMAL HIGH (ref 70–99)
Glucose-Capillary: 116 mg/dL — ABNORMAL HIGH (ref 70–99)
Glucose-Capillary: 117 mg/dL — ABNORMAL HIGH (ref 70–99)
Glucose-Capillary: 118 mg/dL — ABNORMAL HIGH (ref 70–99)
Glucose-Capillary: 120 mg/dL — ABNORMAL HIGH (ref 70–99)
Glucose-Capillary: 125 mg/dL — ABNORMAL HIGH (ref 70–99)
Glucose-Capillary: 125 mg/dL — ABNORMAL HIGH (ref 70–99)
Glucose-Capillary: 136 mg/dL — ABNORMAL HIGH (ref 70–99)
Glucose-Capillary: 141 mg/dL — ABNORMAL HIGH (ref 70–99)
Glucose-Capillary: 142 mg/dL — ABNORMAL HIGH (ref 70–99)
Glucose-Capillary: 145 mg/dL — ABNORMAL HIGH (ref 70–99)
Glucose-Capillary: 172 mg/dL — ABNORMAL HIGH (ref 70–99)
Glucose-Capillary: 77 mg/dL (ref 70–99)
Glucose-Capillary: 89 mg/dL (ref 70–99)
Glucose-Capillary: 93 mg/dL (ref 70–99)
Glucose-Capillary: 97 mg/dL (ref 70–99)
Glucose-Capillary: 98 mg/dL (ref 70–99)
Glucose-Capillary: 98 mg/dL (ref 70–99)

## 2019-06-16 LAB — COMPREHENSIVE METABOLIC PANEL
ALT: 336 U/L — ABNORMAL HIGH (ref 0–44)
AST: 209 U/L — ABNORMAL HIGH (ref 15–41)
Albumin: 3.6 g/dL (ref 3.5–5.0)
Alkaline Phosphatase: 90 U/L (ref 38–126)
Anion gap: 12 (ref 5–15)
BUN: 8 mg/dL (ref 6–20)
CO2: 22 mmol/L (ref 22–32)
Calcium: 8.8 mg/dL — ABNORMAL LOW (ref 8.9–10.3)
Chloride: 94 mmol/L — ABNORMAL LOW (ref 98–111)
Creatinine, Ser: 0.56 mg/dL — ABNORMAL LOW (ref 0.61–1.24)
GFR calc Af Amer: >60 mL/min (ref >60)
GFR calc non Af Amer: >60 mL/min (ref >60)
Glucose, Bld: 137 mg/dL — ABNORMAL HIGH (ref 70–99)
Potassium: 3.8 mmol/L (ref 3.5–5.1)
Sodium: 128 mmol/L — ABNORMAL LOW (ref 135–145)
Total Bilirubin: 5.7 mg/dL — ABNORMAL HIGH (ref 0.3–1.2)
Total Protein: 6.8 g/dL (ref 6.5–8.1)

## 2019-06-16 LAB — URINALYSIS, COMPLETE (UACMP) WITH MICROSCOPIC
Bacteria, UA: NONE SEEN
Bilirubin Urine: NEGATIVE
Glucose, UA: NEGATIVE mg/dL
Hgb urine dipstick: NEGATIVE
Ketones, ur: 20 mg/dL — AB
Leukocytes,Ua: NEGATIVE
Nitrite: NEGATIVE
Protein, ur: NEGATIVE mg/dL
Specific Gravity, Urine: 1.009 (ref 1.005–1.030)
Squamous Epithelial / HPF: NONE SEEN (ref 0–5)
pH: 5 (ref 5.0–8.0)

## 2019-06-16 LAB — TRIGLYCERIDES
Triglycerides: 3943 mg/dL — ABNORMAL HIGH (ref <150)
Triglycerides: 3442 mg/dL — ABNORMAL HIGH (ref <150)

## 2019-06-16 LAB — MRSA PCR SCREENING: MRSA by PCR: NEGATIVE

## 2019-06-16 MED ORDER — ONDANSETRON HCL 4 MG PO TABS
4.0000 mg | ORAL_TABLET | ORAL | Status: DC | PRN
  Filled 2019-06-16: qty 1

## 2019-06-16 MED ORDER — DIPHENHYDRAMINE HCL 50 MG/ML IJ SOLN: 12.5000 mg | Freq: Four times a day (QID) | INTRAMUSCULAR | Status: DC | PRN

## 2019-06-16 MED ORDER — TAMSULOSIN HCL 0.4 MG PO CAPS
0.4000 mg | ORAL_CAPSULE | Freq: Every day | ORAL | Status: DC
  Administered 2019-06-17 – 2019-06-20 (×5): 0.4 mg via ORAL
  Filled 2019-06-16 (×5): qty 1

## 2019-06-16 MED ORDER — HYDROMORPHONE HCL 1 MG/ML IJ SOLN
1.0000 mg | INTRAMUSCULAR | Status: DC | PRN
  Administered 2019-06-16: 1 mg via INTRAVENOUS
  Filled 2019-06-16: qty 1

## 2019-06-16 MED ORDER — ONDANSETRON HCL 4 MG/2ML IJ SOLN: 4.0000 mg | INTRAMUSCULAR | Status: DC | PRN

## 2019-06-16 MED ORDER — HYDROMORPHONE HCL 1 MG/ML IJ SOLN
1.0000 mg | INTRAMUSCULAR | Status: DC | PRN
  Administered 2019-06-16 (×2): 1 mg via INTRAVENOUS
  Administered 2019-06-16 – 2019-06-19 (×19): 2 mg via INTRAVENOUS
  Filled 2019-06-16 (×8): qty 2
  Filled 2019-06-16: qty 1
  Filled 2019-06-16 (×6): qty 2
  Filled 2019-06-16: qty 1
  Filled 2019-06-16 (×4): qty 2

## 2019-06-16 MED ORDER — HYDRALAZINE HCL 20 MG/ML IJ SOLN: 20.0000 mg | INTRAMUSCULAR | Status: DC | PRN

## 2019-06-16 MED ORDER — SIMETHICONE 80 MG PO CHEW
80.0000 mg | CHEWABLE_TABLET | Freq: Four times a day (QID) | ORAL | Status: DC | PRN
  Administered 2019-06-17: 80 mg via ORAL
  Filled 2019-06-16 (×2): qty 1

## 2019-06-16 MED ORDER — CHLORHEXIDINE GLUCONATE CLOTH 2 % EX PADS
6.0000 | MEDICATED_PAD | Freq: Every day | CUTANEOUS | Status: DC
  Administered 2019-06-18: 6 via TOPICAL

## 2019-06-16 MED ORDER — HYDROMORPHONE HCL 1 MG/ML IJ SOLN
INTRAMUSCULAR | Status: AC
  Filled 2019-06-16: qty 2

## 2019-06-16 MED ORDER — PROMETHAZINE HCL 25 MG/ML IJ SOLN
25.0000 mg | Freq: Once | INTRAMUSCULAR | Status: AC
  Administered 2019-06-16: 25 mg via INTRAVENOUS
  Filled 2019-06-16: qty 1

## 2019-06-16 NOTE — ED Notes (Signed)
Pt resting on stretcher without complaints. No acute distress noted.

## 2019-06-16 NOTE — Progress Notes (Signed)
[place] [facility] [place] NAME: Nat Wallman    MR#:  [id number]  DATE OF BIRTH:  1975/12/09  SUBJECTIVE:  CHIEF COMPLAINT: Patient is still having abdominal pain requesting to increase the dose of pain medicine.  Reports some nausea denies any vomiting  REVIEW OF SYSTEMS:  CONSTITUTIONAL: No fever, fatigue or weakness.  EYES: No blurred or double vision.  EARS, NOSE, AND THROAT: No tinnitus or ear pain.  RESPIRATORY: No cough, shortness of breath, wheezing or hemoptysis.  CARDIOVASCULAR: No chest pain, orthopnea, edema.  GASTROINTESTINAL: Reports nausea, denies vomiting, diarrhea, has epigastric abdominal pain.  GENITOURINARY: No dysuria, hematuria.  ENDOCRINE: No polyuria, nocturia,  HEMATOLOGY: No anemia, easy bruising or bleeding SKIN: No rash or lesion. MUSCULOSKELETAL: No joint pain or arthritis.   NEUROLOGIC: No tingling, numbness, weakness.  PSYCHIATRY: No anxiety or depression.   DRUG ALLERGIES:   Allergies  Allergen Reactions  . Iodinated Diagnostic Agents Other (See Comments) and Anaphylaxis    Kidneys stop working KIDNEYS SHUT DOWN    VITALS:  Blood pressure (!) 132/102, pulse 76, temperature 98.7 F (37.1 C), temperature source Oral, resp. rate 13, height 6' (1.829 m), weight 112 kg, SpO2 97 %.  PHYSICAL EXAMINATION:  GENERAL:  43 y.o.-year-old patient lying in the bed with no acute distress.  EYES: Pupils equal, round, reactive to light and accommodation. No scleral icterus. Extraocular muscles intact.  HEENT: Head atraumatic, normocephalic. Oropharynx and nasopharynx clear.  NECK:  Supple, no jugular venous distention. No thyroid enlargement, no tenderness.  LUNGS: Normal breath sounds bilaterally, no wheezing, rales,rhonchi or crepitation. No use of accessory muscles of respiration.  CARDIOVASCULAR: S1, S2 normal. No murmurs, rubs, or gallops.  ABDOMEN: Soft, positive epigastric tenderness , no rebound  tenderness nondistended. Bowel sounds present.  EXTREMITIES: No pedal edema, cyanosis, or clubbing.  NEUROLOGIC: Cranial nerves II through XII are intact. Muscle strength 5/5 in all extremities. Sensation intact. Gait not checked.  PSYCHIATRIC: The patient is alert and oriented x 3.  SKIN: No obvious rash, lesion, or ulcer.    LABORATORY PANEL:   CBC Recent Labs  Lab 06/14/19 2327  WBC 4.9  HGB 15.8  HCT 42.6  PLT 178   ------------------------------------------------------------------------------------------------------------------  Chemistries  Recent Labs  Lab 06/16/19 0510  NA 128*  K 3.8  CL 94*  CO2 22  GLUCOSE 137*  BUN 8  CREATININE 0.56*  CALCIUM 8.8*  AST 209*  ALT 336*  ALKPHOS 90  BILITOT 5.7*   ------------------------------------------------------------------------------------------------------------------  Cardiac Enzymes No results for input(s): TROPONINI in the last 168 hours. ------------------------------------------------------------------------------------------------------------------  RADIOLOGY:  US Abdomen Limited Ruq  Result Date: 06/15/2019 CLINICAL DATA:  Right upper quadrant pain for 2 days. Prior cholecystectomy. Elevated LFTs. EXAM: ULTRASOUND ABDOMEN LIMITED RIGHT UPPER QUADRANT COMPARISON:  CT abdomen and pelvis 01/15/2018. Abdominal ultrasound 11/02/2014. FINDINGS: Gallbladder: Surgically absent. Common bile duct: Diameter: 6 mm with only a small portion of the common bile duct well visualized Liver: Dense appearance of the liver with diffusely increased echogenicity and decreased penetration limiting assessment for a mass or other focal abnormality. Portal vein is patent on color Doppler imaging with normal direction of blood flow towards the liver. Other: None. IMPRESSION: 1. Hepatic steatosis. 2. Prior cholecystectomy.  No biliary dilatation identified. Electronically Signed   By: Logan Bores M.D.   On: 06/15/2019 05:24    EKG:    Orders placed or performed during the hospital encounter of 06/15/19  . EKG 12-Lead  .  EKG 12-Lead  . EKG 12-Lead  . EKG 12-Lead    ASSESSMENT AND PLAN:   This is a 43 year old male admitted for triglyceride induced pancreatitis.  1.  Acute Pancreatitis: Secondary to hypertriglyceridemia.    insulin drip as well as D5.   Continue to monitor triglycerides.  [phone]  Dilaudid as needed for severe pain N.p.o.  2.  Hypertriglyceridemia: With history of familial hypercholesterolemia triglycerides are higher.  Continue close monitoring  Consider addition of Repatha but it is an expensive medicine.  Will talk to social worker regarding medication assistance  continue Lovaza and pancreatic enzymes.    3.  Diabetes mellitus type 2: Patient is on insulin drip  Continue Lyrica for neuropathy . 4.  Hypertension: Controlled; continue to monitor   5.  Pseudohyponatremia from hypertriglyceridemia Continue close monitoring patient is awake and alert     DVT prophylaxis: Lovenox     All the records are reviewed and case discussed with Care Management/Social Workerr. Management plans discussed with the patient, family and they are in agreement.  CODE STATUS: fc  TOTAL TIME TAKING CARE OF THIS PATIENT: 36 minutes.   POSSIBLE D/C IN 2 -3  DAYS, DEPENDING ON CLINICAL CONDITION.  Note: This dictation was prepared with Dragon dictation along with smaller phrase technology. Any transcriptional errors that result from this process are unintentional.   [name] M.D on 06/16/2019 at 1:24 PM  Between 7am to 6pm - Pager - [phone] After 6pm go to [email or web address] - password [email or web address] [facility]  Office  [phone]  CC: Primary care physician; Crecencio Mc, MD

## 2019-06-17 DIAGNOSIS — R748 Abnormal levels of other serum enzymes: Secondary | ICD-10-CM

## 2019-06-17 LAB — COMPREHENSIVE METABOLIC PANEL
ALT: 319 U/L — ABNORMAL HIGH (ref 0–44)
AST: 229 U/L — ABNORMAL HIGH (ref 15–41)
Albumin: 3.9 g/dL (ref 3.5–5.0)
Alkaline Phosphatase: 106 U/L (ref 38–126)
Anion gap: 14 (ref 5–15)
BUN: 6 mg/dL (ref 6–20)
CO2: 25 mmol/L (ref 22–32)
Calcium: 8.9 mg/dL (ref 8.9–10.3)
Chloride: 93 mmol/L — ABNORMAL LOW (ref 98–111)
Creatinine, Ser: 0.62 mg/dL (ref 0.61–1.24)
GFR calc Af Amer: >60 mL/min (ref >60)
GFR calc non Af Amer: >60 mL/min (ref >60)
Glucose, Bld: 104 mg/dL — ABNORMAL HIGH (ref 70–99)
Potassium: 3.2 mmol/L — ABNORMAL LOW (ref 3.5–5.1)
Sodium: 132 mmol/L — ABNORMAL LOW (ref 135–145)
Total Bilirubin: 4.8 mg/dL — ABNORMAL HIGH (ref 0.3–1.2)
Total Protein: 7.3 g/dL (ref 6.5–8.1)

## 2019-06-17 LAB — GLUCOSE, CAPILLARY
Glucose-Capillary: 100 mg/dL — ABNORMAL HIGH (ref 70–99)
Glucose-Capillary: 103 mg/dL — ABNORMAL HIGH (ref 70–99)
Glucose-Capillary: 106 mg/dL — ABNORMAL HIGH (ref 70–99)
Glucose-Capillary: 116 mg/dL — ABNORMAL HIGH (ref 70–99)
Glucose-Capillary: 65 mg/dL — ABNORMAL LOW (ref 70–99)
Glucose-Capillary: 69 mg/dL — ABNORMAL LOW (ref 70–99)
Glucose-Capillary: 71 mg/dL (ref 70–99)
Glucose-Capillary: 76 mg/dL (ref 70–99)
Glucose-Capillary: 77 mg/dL (ref 70–99)
Glucose-Capillary: 77 mg/dL (ref 70–99)
Glucose-Capillary: 78 mg/dL (ref 70–99)
Glucose-Capillary: 78 mg/dL (ref 70–99)
Glucose-Capillary: 80 mg/dL (ref 70–99)
Glucose-Capillary: 85 mg/dL (ref 70–99)
Glucose-Capillary: 86 mg/dL (ref 70–99)
Glucose-Capillary: 88 mg/dL (ref 70–99)
Glucose-Capillary: 88 mg/dL (ref 70–99)
Glucose-Capillary: 89 mg/dL (ref 70–99)
Glucose-Capillary: 89 mg/dL (ref 70–99)
Glucose-Capillary: 90 mg/dL (ref 70–99)
Glucose-Capillary: 97 mg/dL (ref 70–99)

## 2019-06-17 LAB — LIPASE, BLOOD: Lipase: 87 U/L — ABNORMAL HIGH (ref 11–51)

## 2019-06-17 LAB — TRIGLYCERIDES: Triglycerides: 2274 mg/dL — ABNORMAL HIGH (ref <150)

## 2019-06-17 MED ORDER — POTASSIUM CHLORIDE 10 MEQ/100ML IV SOLN
10.0000 meq | INTRAVENOUS | Status: DC
  Filled 2019-06-17 (×4): qty 100

## 2019-06-17 MED ORDER — DEXTROSE 50 % IV SOLN
INTRAVENOUS | Status: AC
  Administered 2019-06-17: 25 mL via INTRAVENOUS
  Filled 2019-06-17: qty 50

## 2019-06-17 MED ORDER — PROMETHAZINE HCL 25 MG/ML IJ SOLN
25.0000 mg | Freq: Four times a day (QID) | INTRAMUSCULAR | Status: DC | PRN
  Administered 2019-06-17 – 2019-06-18 (×6): 25 mg via INTRAVENOUS
  Filled 2019-06-17 (×5): qty 1

## 2019-06-17 MED ORDER — DEXTROSE 50 % IV SOLN
INTRAVENOUS | Status: AC
  Filled 2019-06-17: qty 50

## 2019-06-17 MED ORDER — POTASSIUM CHLORIDE CRYS ER 20 MEQ PO TBCR
40.0000 meq | EXTENDED_RELEASE_TABLET | Freq: Once | ORAL | Status: AC
  Administered 2019-06-17: 40 meq via ORAL
  Filled 2019-06-17: qty 2

## 2019-06-17 MED ORDER — DEXTROSE 50 % IV SOLN
25.0000 mL | Freq: Once | INTRAVENOUS | Status: AC
  Administered 2019-06-17: 25 mL via INTRAVENOUS

## 2019-06-17 MED ORDER — DEXTROSE 50 % IV SOLN
25.0000 mL | Freq: Once | INTRAVENOUS | Status: AC
  Administered 2019-06-17: 05:00:00 25 mL via INTRAVENOUS

## 2019-06-17 NOTE — Progress Notes (Signed)
[place] at [place] NAME: Sergio [name]    MR#:  [id number]  DATE OF BIRTH:  1976-04-21  SUBJECTIVE:  CHIEF COMPLAINT: Patient is doing fine with the current pain medication.  Denies nausea denies any vomiting  REVIEW OF SYSTEMS:  CONSTITUTIONAL: No fever, fatigue or weakness.  EYES: No blurred or double vision.  EARS, NOSE, AND THROAT: No tinnitus or ear pain.  RESPIRATORY: No cough, shortness of breath, wheezing or hemoptysis.  CARDIOVASCULAR: No chest pain, orthopnea, edema.  GASTROINTESTINAL: Reports nausea, denies vomiting, diarrhea, has epigastric abdominal pain.  GENITOURINARY: No dysuria, hematuria.  ENDOCRINE: No polyuria, nocturia,  HEMATOLOGY: No anemia, easy bruising or bleeding SKIN: No rash or lesion. MUSCULOSKELETAL: No joint pain or arthritis.   NEUROLOGIC: No tingling, numbness, weakness.  PSYCHIATRY: No anxiety or depression.   DRUG ALLERGIES:   Allergies  Allergen Reactions  . Iodinated Diagnostic Agents Other (See Comments) and Anaphylaxis    Kidneys stop working KIDNEYS SHUT DOWN    VITALS:  Blood pressure (!) 141/106, pulse 69, temperature 99.8 F (37.7 C), temperature source Oral, resp. rate 12, height 6' (1.829 m), weight 117.2 kg, SpO2 92 %.  PHYSICAL EXAMINATION:  GENERAL:  43 y.o.-year-old patient lying in the bed with no acute distress.  EYES: Pupils equal, round, reactive to light and accommodation. No scleral icterus. Extraocular muscles intact.  HEENT: Head atraumatic, normocephalic. Oropharynx and nasopharynx clear.  NECK:  Supple, no jugular venous distention. No thyroid enlargement, no tenderness.  LUNGS: Normal breath sounds bilaterally, no wheezing, rales,rhonchi or crepitation. No use of accessory muscles of respiration.  CARDIOVASCULAR: S1, S2 normal. No murmurs, rubs, or gallops.  ABDOMEN: Soft, positive epigastric tenderness , no rebound tenderness nondistended. Bowel sounds present.   EXTREMITIES: No pedal edema, cyanosis, or clubbing.  NEUROLOGIC: Cranial nerves II through XII are intact. Muscle strength 5/5 in all extremities. Sensation intact. Gait not checked.  PSYCHIATRIC: The patient is alert and oriented x 3.  SKIN: No obvious rash, lesion, or ulcer.    LABORATORY PANEL:   CBC Recent Labs  Lab 06/14/19 2327  WBC 4.9  HGB 15.8  HCT 42.6  PLT 178   ------------------------------------------------------------------------------------------------------------------  Chemistries  Recent Labs  Lab 06/17/19 0446  NA 132*  K 3.2*  CL 93*  CO2 25  GLUCOSE 104*  BUN 6  CREATININE 0.62  CALCIUM 8.9  AST 229*  ALT 319*  ALKPHOS 106  BILITOT 4.8*   ------------------------------------------------------------------------------------------------------------------  Cardiac Enzymes No results for input(s): TROPONINI in the last 168 hours. ------------------------------------------------------------------------------------------------------------------  RADIOLOGY:  Dg Abd 1 View  Result Date: 06/16/2019 CLINICAL DATA:  Vomiting EXAM: ABDOMEN - 1 VIEW COMPARISON:  CT abdomen pelvis 01/15/2018 FINDINGS: There are a few scattered nondilated loops of bowel with air. Overall paucity of bowel gas limits evaluation for obstruction but there are no dilated loops present. No evidence for free air on supine radiograph. Low lung volumes with bronchovascular crowding. No acute abnormality in the visualized skeleton. IMPRESSION: Nonobstructive bowel gas pattern, evaluation somewhat limited by the overall paucity of bowel gas. Electronically Signed   By: Audie Pinto M.D.   On: 06/16/2019 19:03    EKG:   Orders placed or performed during the hospital encounter of 06/15/19  . EKG 12-Lead  . EKG 12-Lead  . EKG 12-Lead  . EKG 12-Lead    ASSESSMENT AND PLAN:   This is a 43 year old male admitted for triglyceride induced pancreatitis.  1.  Acute Pancreatitis:  Secondary to hypertriglyceridemia.    insulin drip as well as D5.   Continue to monitor triglycerides.  [phone]  Dilaudid as needed for severe pain N.p.o.  2.  Hypertriglyceridemia: With history of familial hypercholesterolemia triglycerides are higher.  Continue close monitoring  Consider addition of Repatha but it is an expensive medicine.  Will talk to social worker regarding medication assistance  continue Lovaza and pancreatic enzymes.    3.  Diabetes mellitus type 2: Patient is on insulin drip  Continue Lyrica for neuropathy . 4.  Hypertension: Controlled; continue to monitor   5.  Pseudohyponatremia from hypertriglyceridemia Continue close monitoring patient is awake and alert  Sodium at 132  6.  Hypokalemia replete and recheck    DVT prophylaxis: Lovenox     All the records are reviewed and case discussed with Care Management/Social Workerr. Management plans discussed with the patient, family and they are in agreement.  CODE STATUS: fc  TOTAL TIME TAKING CARE OF THIS PATIENT: 36 minutes.   POSSIBLE D/C IN 2 -3  DAYS, DEPENDING ON CLINICAL CONDITION.  Note: This dictation was prepared with Dragon dictation along with smaller phrase technology. Any transcriptional errors that result from this process are unintentional.   Sergio [name] M.D on 06/17/2019 at 1:11 PM  Between 7am to 6pm - Pager - [phone] After 6pm go to [email or web address] - password [email or web address] [facility]  Office  [phone]  CC: Primary care physician; Crecencio Mc, MD

## 2019-06-17 NOTE — Consult Note (Signed)
PHARMACY CONSULT NOTE - FOLLOW UP  Pharmacy Consult for Electrolyte Monitoring and Replacement   Recent Labs: Potassium (mmol/L)  Date Value  06/17/2019 3.2 (L)  11/04/2014 3.6   Magnesium (mg/dL)  Date Value  01/21/2018 1.8  03/18/2014 1.9   Calcium (mg/dL)  Date Value  06/17/2019 8.9   Calcium, Total (mg/dL)  Date Value  11/04/2014 8.5   Albumin (g/dL)  Date Value  06/17/2019 3.9  11/01/2014 3.0 (L)   Phosphorus (mg/dL)  Date Value  01/21/2018 5.1 (H)   Sodium  Date Value  06/17/2019 132 mmol/L (L)  04/12/2018 129 (A)  11/04/2014 137 mmol/L    Assessment: Pharmacy consulted for electrolyte monitoring and replacement for 43 yo male admitted with Hypertriglyceridemia and acute pancreatitis. Patient currently on insulin infusion.   Goal of Therapy:  Electrolytes WNL  Plan:  9/6 AM: K : 3.2. Patient able to take to PO medications.  KCL 40 mEq PO x 1 dose ordered.  No additional replacement needed at this time.   Pharmacy will F/U with AM labs and continue to replace as needed.   Pernell Dupre, PharmD, BCPS Clinical Pharmacist 06/17/2019 1:31 PM

## 2019-06-17 NOTE — Consult Note (Signed)
PULMONARY / CRITICAL CARE MEDICINE  Name: Sergio [name] MRN: [id number] DOB: December 23, 1975    LOS: 2  Referring Provider: Dr. Margaretmary Eddy Reason for Referral: Acute pancreatitis  HPI: 43 year old male with a history of recurrent hypertriglyceridemia induced pancreatitis who presented to the ED with complaints of epigastric abdominal pain x2 days with associated nausea without vomiting.  His ED work-up showed a triglyceride level greater than 5000.  He is being admitted for an insulin drip to control his triglyceride level and for pain management  Past Medical History:  Diagnosis Date  . Anxiety   . Chicken pox   . Depression   . Diabetes mellitus    DM2  . Familial hypertriglyceridemia    severe  . Gastric ulcer 2009  . HTN (hypertension)   . Morbid obesity ([place])    s/p bariatric sleeve surgery 01/2015  . Recurrent acute pancreatitis    secondary to hypertriglyceridemia   . Sleep apnea 1999   uses CPAP   Past Surgical History:  Procedure Laterality Date  . CHOLECYSTECTOMY    . LAPAROSCOPIC GASTRIC SLEEVE RESECTION    . [place] SURGERY  2008  . TONSILLECTOMY  2003  . VASECTOMY     No current facility-administered medications on file prior to encounter.    Current Outpatient Medications on File Prior to Encounter  Medication Sig  . ALPRAZolam (XANAX) 0.5 MG tablet TAKE 1 TABLET BY MOUTH TWICE DAILY AS NEEDED FOR ANXIETY (Patient taking differently: Take 0.5 mg by mouth 2 (two) times daily as needed for anxiety. )  . diclofenac (VOLTAREN) 75 MG EC tablet TAKE ONE TABLET TWICE DAILY (Patient taking differently: Take 75 mg by mouth at bedtime. )  . insulin aspart (NOVOLOG) 100 UNIT/ML injection Inject 1 Dose into the skin See admin instructions. Per sliding scale  . Insulin Glargine, 2 Unit Dial, (TOUJEO MAX SOLOSTAR) 300 UNIT/ML SOPN Inject 90 Units into the skin at bedtime.  [name] omega-3 acid ethyl esters (LOVAZA) 1 g capsule Take 2 capsules (2 g total) by mouth 2 (two) times  daily. (Patient taking differently: Take 3 g by mouth 2 (two) times daily. )  . Pancrelipase, Lip-Prot-Amyl, (ZENPEP) 40000-126000 units CPEP Take 1 capsule by mouth 5 (five) times daily. Before meals and  snacks (Patient taking differently: Take 1-5 capsules by mouth as directed. 5 Capsule Before meals and 2 capsules with snacks)  . pregabalin (LYRICA) 300 MG capsule Take 300 mg by mouth at bedtime.   . traZODone (DESYREL) 100 MG tablet Take 2 tablets (200 mg total) by mouth daily as needed for sleep. (Patient taking differently: Take 100 mg by mouth at bedtime as needed for sleep. )  . TRULICITY 1.5 0000000 SOPN Inject 1.5 mg into the skin every Monday.     Allergies Allergies  Allergen Reactions  . Iodinated Diagnostic Agents Other (See Comments) and Anaphylaxis    Kidneys stop working KIDNEYS SHUT DOWN    Family History Family History  Adopted: Yes  Problem Relation Age of Onset  . Hypertension Mother   . Hyperlipidemia Mother   . Hypertension Father   . Hyperlipidemia Father    Social History  reports that he has never smoked. He has never used smokeless tobacco. He reports that he does not drink alcohol or use drugs.  Review Of Systems:   Constitutional: Negative for fever and chills.  HENT: Negative for congestion and rhinorrhea.  Eyes: Negative for redness and visual disturbance.  Respiratory: Negative for shortness of breath and  wheezing.  Cardiovascular: Negative for chest pain and palpitations.  Gastrointestinal: Positive for abdominal pain and nausea but negative for vomiting and diarrhea Genitourinary: Negative for dysuria and urgency.  Endocrine: Denies polyuria, polyphagia and heat intolerance Musculoskeletal: Negative for myalgias and arthralgias.  Skin: Negative for pallor and wound.  Neurological: Negative for dizziness and headaches   VITAL SIGNS: BP 125/69 (BP Location: Right Arm)   Pulse 74   Temp 99.8 F (37.7 C) (Oral)   Resp (!) 8   Ht 6'  (1.829 m)   Wt 117.2 kg   SpO2 97%   BMI 35.04 kg/m   HEMODYNAMICS:    VENTILATOR SETTINGS:    INTAKE / OUTPUT: I/O last 3 completed shifts: In: 5539.3 [I.V.:5539.3] Out: 525 [Urine:525]  PHYSICAL EXAMINATION: General: Obese, in no acute distress HEENT: PERRLA, neck is short, trachea midline Neuro: Alert and oriented x4, no focal deficit Cardiovascular:  RRR, S1-S2, no murmur regurg or gallop, no edema Lungs: Clear to auscultation bilaterally Abdomen: Normal bowel sounds in all 4 quadrants, palpation reveals no organomegaly, diffuse tenderness with gentle palpation Musculoskeletal: No joint deformities, positive range of motion Skin: Warm and dry  LABS:  BMET Recent Labs  Lab 06/15/19 0923 06/15/19 1609 06/16/19 0510 06/17/19 0446  NA 133*  --  128* 132*  K 5.6* 4.4 3.8 3.2*  CL 99  --  94* 93*  CO2 21*  --  22 25  BUN 8  --  8 6  CREATININE 0.76  --  0.56* 0.62  GLUCOSE 135*  --  137* 104*    Electrolytes Recent Labs  Lab 06/15/19 0923 06/16/19 0510 06/17/19 0446  CALCIUM 8.9 8.8* 8.9    CBC Recent Labs  Lab 06/14/19 2327  WBC 4.9  HGB 15.8  HCT 42.6  PLT 178    Coag's No results for input(s): APTT, INR in the last 168 hours.  Sepsis Markers No results for input(s): LATICACIDVEN, PROCALCITON, O2SATVEN in the last 168 hours.  ABG No results for input(s): PHART, PCO2ART, PO2ART in the last 168 hours.  Liver Enzymes Recent Labs  Lab 06/15/19 0923 06/16/19 0510 06/17/19 0446  AST 313* 209* 229*  ALT 419* 336* 319*  ALKPHOS 80 90 106  BILITOT 8.4* 5.7* 4.8*  ALBUMIN 4.0 3.6 3.9    Cardiac Enzymes No results for input(s): TROPONINI, PROBNP in the last 168 hours.  Glucose Recent Labs  Lab 06/17/19 0231 06/17/19 0424 06/17/19 0501 06/17/19 0532 06/17/19 0632 06/17/19 0718  GLUCAP 77 65* 103* 100* 88 85    Imaging Dg Abd 1 View  Result Date: 06/16/2019 CLINICAL DATA:  Vomiting EXAM: ABDOMEN - 1 VIEW COMPARISON:  CT abdomen  pelvis 01/15/2018 FINDINGS: There are a few scattered nondilated loops of bowel with air. Overall paucity of bowel gas limits evaluation for obstruction but there are no dilated loops present. No evidence for free air on supine radiograph. Low lung volumes with bronchovascular crowding. No acute abnormality in the visualized skeleton. IMPRESSION: Nonobstructive bowel gas pattern, evaluation somewhat limited by the overall paucity of bowel gas. Electronically Signed   By: Audie Pinto M.D.   On: 06/16/2019 19:03    ASSESSMENT  Hyperglyceridemia induced pancreatitis Sleep apnea Type 2 diabetes Hypertension  PLAN Insulin infusion per protocol Blood glucose monitoring Titrate D5 to maintain blood glucose levels within normal limits Resume all home medications Mandatory nocturnal CPAP  Best Practice: Code Status: Full code Diet: Clear liquids and advance to heart healthy diet GI prophylaxis: Not indicated  VTE prophylaxis: SCDs and Lovenox FAMILY  - Updates: Patient updated on current treatment plan  Graylin Sperling S. Tukov-Yual ANP-BC Pulmonary and [place] Pager [phone] or [phone]  NB: This document was prepared using Dragon voice recognition software and may include unintentional dictation errors.    06/17/2019, 7:48 AM

## 2019-06-18 LAB — GLUCOSE, CAPILLARY
Glucose-Capillary: 101 mg/dL — ABNORMAL HIGH (ref 70–99)
Glucose-Capillary: 104 mg/dL — ABNORMAL HIGH (ref 70–99)
Glucose-Capillary: 108 mg/dL — ABNORMAL HIGH (ref 70–99)
Glucose-Capillary: 117 mg/dL — ABNORMAL HIGH (ref 70–99)
Glucose-Capillary: 119 mg/dL — ABNORMAL HIGH (ref 70–99)
Glucose-Capillary: 120 mg/dL — ABNORMAL HIGH (ref 70–99)
Glucose-Capillary: 129 mg/dL — ABNORMAL HIGH (ref 70–99)
Glucose-Capillary: 195 mg/dL — ABNORMAL HIGH (ref 70–99)
Glucose-Capillary: 59 mg/dL — ABNORMAL LOW (ref 70–99)
Glucose-Capillary: 61 mg/dL — ABNORMAL LOW (ref 70–99)
Glucose-Capillary: 65 mg/dL — ABNORMAL LOW (ref 70–99)
Glucose-Capillary: 68 mg/dL — ABNORMAL LOW (ref 70–99)
Glucose-Capillary: 70 mg/dL (ref 70–99)
Glucose-Capillary: 72 mg/dL (ref 70–99)
Glucose-Capillary: 74 mg/dL (ref 70–99)
Glucose-Capillary: 74 mg/dL (ref 70–99)
Glucose-Capillary: 77 mg/dL (ref 70–99)
Glucose-Capillary: 79 mg/dL (ref 70–99)
Glucose-Capillary: 79 mg/dL (ref 70–99)
Glucose-Capillary: 82 mg/dL (ref 70–99)
Glucose-Capillary: 89 mg/dL (ref 70–99)
Glucose-Capillary: 91 mg/dL (ref 70–99)

## 2019-06-18 LAB — COMPREHENSIVE METABOLIC PANEL WITH GFR
ALT: 283 U/L — ABNORMAL HIGH (ref 0–44)
AST: 207 U/L — ABNORMAL HIGH (ref 15–41)
Albumin: 3.7 g/dL (ref 3.5–5.0)
Alkaline Phosphatase: 116 U/L (ref 38–126)
Anion gap: 9 (ref 5–15)
BUN: 6 mg/dL (ref 6–20)
CO2: 29 mmol/L (ref 22–32)
Calcium: 8.8 mg/dL — ABNORMAL LOW (ref 8.9–10.3)
Chloride: 95 mmol/L — ABNORMAL LOW (ref 98–111)
Creatinine, Ser: 0.88 mg/dL (ref 0.61–1.24)
GFR calc Af Amer: >60 mL/min
GFR calc non Af Amer: >60 mL/min
Glucose, Bld: 72 mg/dL (ref 70–99)
Potassium: 3.6 mmol/L (ref 3.5–5.1)
Sodium: 133 mmol/L — ABNORMAL LOW (ref 135–145)
Total Bilirubin: 5.2 mg/dL — ABNORMAL HIGH (ref 0.3–1.2)
Total Protein: 7.3 g/dL (ref 6.5–8.1)

## 2019-06-18 LAB — LIPASE, BLOOD: Lipase: 97 U/L — ABNORMAL HIGH (ref 11–51)

## 2019-06-18 LAB — TRIGLYCERIDES: Triglycerides: 1341 mg/dL — ABNORMAL HIGH (ref <150)

## 2019-06-18 MED ORDER — DEXTROSE 50 % IV SOLN
INTRAVENOUS | Status: AC
  Filled 2019-06-18: qty 50

## 2019-06-18 MED ORDER — POTASSIUM CHLORIDE CRYS ER 20 MEQ PO TBCR
20.0000 meq | EXTENDED_RELEASE_TABLET | Freq: Once | ORAL | Status: AC
  Administered 2019-06-18: 20 meq via ORAL
  Filled 2019-06-18: qty 1

## 2019-06-18 MED ORDER — DEXTROSE 50 % IV SOLN
25.0000 mL | Freq: Once | INTRAVENOUS | Status: AC
  Administered 2019-06-18: 04:00:00 25 mL via INTRAVENOUS

## 2019-06-18 MED ORDER — DEXTROSE 50 % IV SOLN
12.5000 g | INTRAVENOUS | Status: AC
  Administered 2019-06-18: 12.5 g via INTRAVENOUS

## 2019-06-18 MED ORDER — DEXTROSE 50 % IV SOLN
INTRAVENOUS | Status: AC
  Administered 2019-06-18: 25 mL via INTRAVENOUS
  Filled 2019-06-18: qty 50

## 2019-06-18 MED ORDER — DEXTROSE 50 % IV SOLN
25.0000 mL | Freq: Once | INTRAVENOUS | Status: AC
  Administered 2019-06-18: 25 mL via INTRAVENOUS
  Filled 2019-06-18: qty 50

## 2019-06-18 NOTE — Progress Notes (Signed)
Shift summary:  - NAD on initial exam. Patient requesting an "Ice pop". Will address NPO status with medical team.

## 2019-06-18 NOTE — Plan of Care (Signed)

## 2019-06-18 NOTE — Consult Note (Signed)
PHARMACY CONSULT NOTE - FOLLOW UP  Pharmacy Consult for Electrolyte Monitoring and Replacement   Recent Labs: Potassium (mmol/L)  Date Value  06/18/2019 3.6  11/04/2014 3.6   Magnesium (mg/dL)  Date Value  01/21/2018 1.8  03/18/2014 1.9   Calcium (mg/dL)  Date Value  06/18/2019 8.8 (L)   Calcium, Total (mg/dL)  Date Value  11/04/2014 8.5   Albumin (g/dL)  Date Value  06/18/2019 3.7  11/01/2014 3.0 (L)   Phosphorus (mg/dL)  Date Value  01/21/2018 5.1 (H)   Sodium  Date Value  06/18/2019 133 mmol/L (L)  04/12/2018 129 (A)  11/04/2014 137 mmol/L    Assessment: Pharmacy consulted for electrolyte monitoring and replacement for 43 yo male admitted with Hypertriglyceridemia and acute pancreatitis. Patient currently on insulin infusion.   Goal of Therapy:  Electrolytes WNL  Plan:  9/6 AM: K : 3.6 Na 133 Patient able to take to PO medications.  KCL 20 mEq PO x 1 dose ordered.  No additional replacement needed at this time.   Pharmacy will F/U with AM labs and continue to replace as needed.   Lu Duffel, PharmD, BCPS Clinical Pharmacist 06/18/2019 7:44 AM

## 2019-06-18 NOTE — Progress Notes (Signed)
[place] at [place] NAME: Sergio [name]    MR#:  [id number]  DATE OF BIRTH:  1976/01/30  SUBJECTIVE:  CHIEF COMPLAINT: Patient is doing fine with the current pain medication.  Tolerating clear liquid diet, denies nausea denies any vomiting  REVIEW OF SYSTEMS:  CONSTITUTIONAL: No fever, fatigue or weakness.  EYES: No blurred or double vision.  EARS, NOSE, AND THROAT: No tinnitus or ear pain.  RESPIRATORY: No cough, shortness of breath, wheezing or hemoptysis.  CARDIOVASCULAR: No chest pain, orthopnea, edema.  GASTROINTESTINAL: Reports nausea, denies vomiting, diarrhea, has epigastric abdominal pain.  GENITOURINARY: No dysuria, hematuria.  ENDOCRINE: No polyuria, nocturia,  HEMATOLOGY: No anemia, easy bruising or bleeding SKIN: No rash or lesion. MUSCULOSKELETAL: No joint pain or arthritis.   NEUROLOGIC: No tingling, numbness, weakness.  PSYCHIATRY: No anxiety or depression.   DRUG ALLERGIES:   Allergies  Allergen Reactions  . Iodinated Diagnostic Agents Other (See Comments) and Anaphylaxis    Kidneys stop working KIDNEYS SHUT DOWN    VITALS:  Blood pressure 114/85, pulse 73, temperature 97.9 F (36.6 C), temperature source Axillary, resp. rate 13, height 6' (1.829 m), weight 112.5 kg, SpO2 95 %.  PHYSICAL EXAMINATION:  GENERAL:  43 y.o.-year-old patient lying in the bed with no acute distress.  EYES: Pupils equal, round, reactive to light and accommodation. No scleral icterus. Extraocular muscles intact.  HEENT: Head atraumatic, normocephalic. Oropharynx and nasopharynx clear.  NECK:  Supple, no jugular venous distention. No thyroid enlargement, no tenderness.  LUNGS: Normal breath sounds bilaterally, no wheezing, rales,rhonchi or crepitation. No use of accessory muscles of respiration.  CARDIOVASCULAR: S1, S2 normal. No murmurs, rubs, or gallops.  ABDOMEN: Soft, positive epigastric tenderness , no rebound tenderness  nondistended. Bowel sounds present.  EXTREMITIES: No pedal edema, cyanosis, or clubbing.  NEUROLOGIC: Cranial nerves II through XII are intact. Muscle strength 5/5 in all extremities. Sensation intact. Gait not checked.  PSYCHIATRIC: The patient is alert and oriented x 3.  SKIN: No obvious rash, lesion, or ulcer.    LABORATORY PANEL:   CBC Recent Labs  Lab 06/14/19 2327  WBC 4.9  HGB 15.8  HCT 42.6  PLT 178   ------------------------------------------------------------------------------------------------------------------  Chemistries  Recent Labs  Lab 06/18/19 0529  NA 133*  K 3.6  CL 95*  CO2 29  GLUCOSE 72  BUN 6  CREATININE 0.88  CALCIUM 8.8*  AST 207*  ALT 283*  ALKPHOS 116  BILITOT 5.2*   ------------------------------------------------------------------------------------------------------------------  Cardiac Enzymes No results for input(s): TROPONINI in the last 168 hours. ------------------------------------------------------------------------------------------------------------------  RADIOLOGY:  Dg Abd 1 View  Result Date: 06/16/2019 CLINICAL DATA:  Vomiting EXAM: ABDOMEN - 1 VIEW COMPARISON:  CT abdomen pelvis 01/15/2018 FINDINGS: There are a few scattered nondilated loops of bowel with air. Overall paucity of bowel gas limits evaluation for obstruction but there are no dilated loops present. No evidence for free air on supine radiograph. Low lung volumes with bronchovascular crowding. No acute abnormality in the visualized skeleton. IMPRESSION: Nonobstructive bowel gas pattern, evaluation somewhat limited by the overall paucity of bowel gas. Electronically Signed   By: Audie Pinto M.D.   On: 06/16/2019 19:03    EKG:   Orders placed or performed during the hospital encounter of 06/15/19  . EKG 12-Lead  . EKG 12-Lead  . EKG 12-Lead  . EKG 12-Lead    ASSESSMENT AND PLAN:   This is a 43 year old male admitted for triglyceride induced  pancreatitis.  1.  Acute Pancreatitis: Secondary to hypertriglyceridemia.    insulin drip as well as D5.   Continue to monitor triglycerides.  [id number]  Dilaudid as needed for severe pain Clear liquid diet Lipase at 97 2.  Hypertriglyceridemia: With history of familial hypercholesterolemia triglycerides are higher.  Continue close monitoring  Consider addition of Repatha but it is an expensive medicine.  Will talk to social worker regarding medication assistance  continue Lovaza and pancreatic enzymes.    3.  Diabetes mellitus type 2: Patient is on insulin drip  Continue Lyrica for neuropathy . 4.  Hypertension: Controlled; continue to monitor   5.  Pseudohyponatremia from hypertriglyceridemia Continue close monitoring patient is awake and alert  Sodium at 132-133  6.  Hypokalemia repleted, potassium at 3.6 today    DVT prophylaxis: Lovenox     All the records are reviewed and case discussed with Care Management/Social Workerr. Management plans discussed with the patient, family and they are in agreement.  CODE STATUS: fc  TOTAL TIME TAKING CARE OF THIS PATIENT: 33 minutes.   POSSIBLE D/C IN 2 -  DAYS, DEPENDING ON CLINICAL CONDITION.  Note: This dictation was prepared with Dragon dictation along with smaller phrase technology. Any transcriptional errors that result from this process are unintentional.   [name] M.D on 06/18/2019 at 12:04 PM  Between 7am to 6pm - Pager - [phone] After 6pm go to [email or web address] - password [email or web address] [facility]  Office  [phone]  CC: Primary care physician; Crecencio Mc, MD

## 2019-06-18 NOTE — Progress Notes (Signed)
Took over patient care approximately 14:30- Patient alert and oriented- vitals stable.

## 2019-06-18 NOTE — Progress Notes (Signed)
Hypoglycemic Event  CBG: 61  Treatment: 1/2 amp D50  Symptoms: NONE  Follow-up CBG: Time: 1100 CBG Result  Possible Reasons for Event: Insulin infusion  Comments/MD notified Dr. Mortimer Fries notified.    Jesse Sans, RN, CCRN

## 2019-06-19 ENCOUNTER — Other Ambulatory Visit: Payer: Self-pay

## 2019-06-19 LAB — CBC WITH DIFFERENTIAL/PLATELET
Abs Immature Granulocytes: 0.02 10*3/uL (ref 0.00–0.07)
Basophils Absolute: 0 10*3/uL (ref 0.0–0.1)
Basophils Relative: 1 %
Eosinophils Absolute: 0.2 10*3/uL (ref 0.0–0.5)
Eosinophils Relative: 5 %
HCT: 43.7 % (ref 39.0–52.0)
Hemoglobin: 14.4 g/dL (ref 13.0–17.0)
Immature Granulocytes: 1 %
Lymphocytes Relative: 40 %
Lymphs Abs: 1.3 10*3/uL (ref 0.7–4.0)
MCH: 27.9 pg (ref 26.0–34.0)
MCHC: 33 g/dL (ref 30.0–36.0)
MCV: 84.5 fL (ref 80.0–100.0)
Monocytes Absolute: 0.3 10*3/uL (ref 0.1–1.0)
Monocytes Relative: 10 %
Neutro Abs: 1.4 10*3/uL — ABNORMAL LOW (ref 1.7–7.7)
Neutrophils Relative %: 43 %
Platelets: 173 10*3/uL (ref 150–400)
RBC: 5.17 MIL/uL (ref 4.22–5.81)
RDW: 16.5 % — ABNORMAL HIGH (ref 11.5–15.5)
WBC: 3.2 10*3/uL — ABNORMAL LOW (ref 4.0–10.5)
nRBC: 0 % (ref 0.0–0.2)

## 2019-06-19 LAB — GLUCOSE, CAPILLARY
Glucose-Capillary: 106 mg/dL — ABNORMAL HIGH (ref 70–99)
Glucose-Capillary: 128 mg/dL — ABNORMAL HIGH (ref 70–99)
Glucose-Capillary: 134 mg/dL — ABNORMAL HIGH (ref 70–99)
Glucose-Capillary: 138 mg/dL — ABNORMAL HIGH (ref 70–99)
Glucose-Capillary: 145 mg/dL — ABNORMAL HIGH (ref 70–99)
Glucose-Capillary: 145 mg/dL — ABNORMAL HIGH (ref 70–99)
Glucose-Capillary: 150 mg/dL — ABNORMAL HIGH (ref 70–99)
Glucose-Capillary: 153 mg/dL — ABNORMAL HIGH (ref 70–99)
Glucose-Capillary: 183 mg/dL — ABNORMAL HIGH (ref 70–99)
Glucose-Capillary: 65 mg/dL — ABNORMAL LOW (ref 70–99)
Glucose-Capillary: 75 mg/dL (ref 70–99)
Glucose-Capillary: 77 mg/dL (ref 70–99)
Glucose-Capillary: 83 mg/dL (ref 70–99)
Glucose-Capillary: 95 mg/dL (ref 70–99)

## 2019-06-19 LAB — BASIC METABOLIC PANEL
Anion gap: 9 (ref 5–15)
BUN: 6 mg/dL (ref 6–20)
CO2: 26 mmol/L (ref 22–32)
Calcium: 8.7 mg/dL — ABNORMAL LOW (ref 8.9–10.3)
Chloride: 97 mmol/L — ABNORMAL LOW (ref 98–111)
Creatinine, Ser: 0.87 mg/dL (ref 0.61–1.24)
GFR calc Af Amer: >60 mL/min (ref >60)
GFR calc non Af Amer: >60 mL/min (ref >60)
Glucose, Bld: 120 mg/dL — ABNORMAL HIGH (ref 70–99)
Potassium: 3.1 mmol/L — ABNORMAL LOW (ref 3.5–5.1)
Sodium: 132 mmol/L — ABNORMAL LOW (ref 135–145)

## 2019-06-19 LAB — POTASSIUM: Potassium: 4 mmol/L (ref 3.5–5.1)

## 2019-06-19 LAB — LIPASE, BLOOD: Lipase: 98 U/L — ABNORMAL HIGH (ref 11–51)

## 2019-06-19 LAB — TRIGLYCERIDES: Triglycerides: 849 mg/dL — ABNORMAL HIGH (ref <150)

## 2019-06-19 LAB — MAGNESIUM: Magnesium: 1.9 mg/dL (ref 1.7–2.4)

## 2019-06-19 MED ORDER — INFLUENZA VAC SPLIT QUAD 0.5 ML IM SUSY
0.5000 mL | PREFILLED_SYRINGE | INTRAMUSCULAR | Status: DC
  Filled 2019-06-19: qty 0.5

## 2019-06-19 MED ORDER — HYDROCODONE-ACETAMINOPHEN 5-325 MG PO TABS
1.0000 | ORAL_TABLET | Freq: Four times a day (QID) | ORAL | Status: DC | PRN
  Administered 2019-06-19: 1 via ORAL
  Administered 2019-06-19: 2 via ORAL
  Administered 2019-06-19: 1 via ORAL
  Administered 2019-06-20 (×2): 2 via ORAL
  Filled 2019-06-19: qty 2
  Filled 2019-06-19 (×2): qty 1
  Filled 2019-06-19 (×2): qty 2

## 2019-06-19 MED ORDER — INSULIN ASPART 100 UNIT/ML ~~LOC~~ SOLN
0.0000 [IU] | Freq: Three times a day (TID) | SUBCUTANEOUS | Status: DC
  Administered 2019-06-19: 2 [IU] via SUBCUTANEOUS
  Administered 2019-06-20: 08:00:00 3 [IU] via SUBCUTANEOUS
  Administered 2019-06-20: 12:00:00 5 [IU] via SUBCUTANEOUS
  Filled 2019-06-19 (×3): qty 1

## 2019-06-19 MED ORDER — INSULIN ASPART 100 UNIT/ML ~~LOC~~ SOLN
0.0000 [IU] | SUBCUTANEOUS | Status: DC
  Administered 2019-06-19: 3 [IU] via SUBCUTANEOUS
  Filled 2019-06-19: qty 1

## 2019-06-19 MED ORDER — HYDROMORPHONE HCL 1 MG/ML IJ SOLN
0.5000 mg | Freq: Once | INTRAMUSCULAR | Status: AC
  Administered 2019-06-19: 0.5 mg via INTRAVENOUS
  Filled 2019-06-19: qty 1

## 2019-06-19 MED ORDER — HYDROMORPHONE HCL 1 MG/ML IJ SOLN: 1.0000 mg | Freq: Four times a day (QID) | INTRAMUSCULAR | Status: DC | PRN

## 2019-06-19 MED ORDER — POTASSIUM CHLORIDE CRYS ER 20 MEQ PO TBCR
30.0000 meq | EXTENDED_RELEASE_TABLET | ORAL | Status: AC
  Administered 2019-06-19 (×2): 30 meq via ORAL
  Filled 2019-06-19 (×2): qty 2

## 2019-06-19 MED ORDER — INSULIN DETEMIR 100 UNIT/ML ~~LOC~~ SOLN
36.0000 [IU] | Freq: Two times a day (BID) | SUBCUTANEOUS | Status: DC
  Administered 2019-06-19 – 2019-06-20 (×3): 36 [IU] via SUBCUTANEOUS
  Filled 2019-06-19 (×6): qty 0.36

## 2019-06-19 NOTE — Progress Notes (Signed)
Patient to be moved to 111. Patient aware of transfer and will notify family. Report called to [facility] on 1C.

## 2019-06-19 NOTE — Progress Notes (Signed)
[facility] [facility] [place] NAME: Sergio [name]    MR#:  [id number]  DATE OF BIRTH:  [birth year]  SUBJECTIVE:  CHIEF COMPLAINT: Patient is doing fine, tolerating clear liquid diet pain in the epigastric area is 3-4 out of 10, agreeable to discontinue IV Dilaudid  REVIEW OF SYSTEMS:  CONSTITUTIONAL: No fever, fatigue or weakness.  EYES: No blurred or double vision.  EARS, NOSE, AND THROAT: No tinnitus or ear pain.  RESPIRATORY: No cough, shortness of breath, wheezing or hemoptysis.  CARDIOVASCULAR: No chest pain, orthopnea, edema.  GASTROINTESTINAL: Reports nausea, denies vomiting, diarrhea, has epigastric abdominal pain.  GENITOURINARY: No dysuria, hematuria.  ENDOCRINE: No polyuria, nocturia,  HEMATOLOGY: No anemia, easy bruising or bleeding SKIN: No rash or lesion. MUSCULOSKELETAL: No joint pain or arthritis.   NEUROLOGIC: No tingling, numbness, weakness.  PSYCHIATRY: No anxiety or depression.   DRUG ALLERGIES:   Allergies  Allergen Reactions  . Iodinated Diagnostic Agents Other (See Comments) and Anaphylaxis    Kidneys stop working KIDNEYS SHUT DOWN    VITALS:  Blood pressure (!) 154/100, pulse 74, temperature 98.7 F (37.1 C), temperature source Axillary, resp. rate 14, height 6' (1.829 m), weight 112.5 kg, SpO2 99 %.  PHYSICAL EXAMINATION:  GENERAL:  43 y.o.-year-old patient lying in the bed with no acute distress.  EYES: Pupils equal, round, reactive to light and accommodation. No scleral icterus. Extraocular muscles intact.  HEENT: Head atraumatic, normocephalic. Oropharynx and nasopharynx clear.  NECK:  Supple, no jugular venous distention. No thyroid enlargement, no tenderness.  LUNGS: Normal breath sounds bilaterally, no wheezing, rales,rhonchi or crepitation. No use of accessory muscles of respiration.  CARDIOVASCULAR: S1, S2 normal. No murmurs, rubs, or gallops.  ABDOMEN: Soft, positive epigastric tenderness , no  rebound tenderness nondistended. Bowel sounds present.  EXTREMITIES: No pedal edema, cyanosis, or clubbing.  NEUROLOGIC: Cranial nerves II through XII are intact. Muscle strength 5/5 in all extremities. Sensation intact. Gait not checked.  PSYCHIATRIC: The patient is alert and oriented x 3.  SKIN: No obvious rash, lesion, or ulcer.    LABORATORY PANEL:   CBC Recent Labs  Lab 06/19/19 0311  WBC 3.2*  HGB 14.4  HCT 43.7  PLT 173   ------------------------------------------------------------------------------------------------------------------  Chemistries  Recent Labs  Lab 06/18/19 0529 06/19/19 0311  NA 133* 132*  K 3.6 3.1*  CL 95* 97*  CO2 29 26  GLUCOSE 72 120*  BUN 6 6  CREATININE 0.88 0.87  CALCIUM 8.8* 8.7*  AST 207*  --   ALT 283*  --   ALKPHOS 116  --   BILITOT 5.2*  --    ------------------------------------------------------------------------------------------------------------------  Cardiac Enzymes No results for input(s): TROPONINI in the last 168 hours. ------------------------------------------------------------------------------------------------------------------  RADIOLOGY:  No results found.  EKG:   Orders placed or performed during the hospital encounter of 06/15/19  . EKG 12-Lead  . EKG 12-Lead  . EKG 12-Lead  . EKG 12-Lead  . EKG    ASSESSMENT AND PLAN:   This is a 43 year old male admitted for triglyceride induced pancreatitis.  1.  Acute Pancreatitis: Secondary to hypertriglyceridemia.    insulin drip as well as D5.   Continue to monitor triglycerides.  [id number]  Dilaudid IV discontinued as pain significantly improved will give him Percocet as needed patient is agreeable Clear liquid diet-advance to full liquids Lipase at 87-98  2.  Hypertriglyceridemia: With history of familial hypercholesterolemia triglycerides are higher.  Continue close monitoring  Consider addition of Repatha  but it is an expensive  medicine.  Will talk to social worker regarding medication assistance  continue Lovaza and pancreatic enzymes.    3.  Diabetes mellitus type 2: Patient is on insulin drip for hypertriglyceridemia will discontinue insulin drip and D5 Patient is started on sliding scale insulin and  Continue Lyrica for neuropathy . 4.  Hypertension: Controlled; continue to monitor   5.  Pseudohyponatremia from hypertriglyceridemia Continue close monitoring patient is awake and alert  Sodium at 132-133-132  6.  Hypokalemia replete with potassium supplements potassium is at 3.1 today will recheck in a.m.,    DVT prophylaxis: Lovenox     All the records are reviewed and case discussed with Care Management/Social Workerr. Management plans discussed with the patient, family and they are in agreement.  CODE STATUS: fc  TOTAL TIME TAKING CARE OF THIS PATIENT: 33 minutes.   POSSIBLE D/C IN 1-2 -  DAYS, DEPENDING ON CLINICAL CONDITION.  Note: This dictation was prepared with Dragon dictation along with smaller phrase technology. Any transcriptional errors that result from this process are unintentional.   [name] M.D on 06/19/2019 at 11:40 AM  Between 7am to 6pm - Pager - [phone] After 6pm go to [email or web address] - password [email or web address] [facility]  Office  [phone]  CC: Primary care physician; Crecencio Mc, MD

## 2019-06-19 NOTE — Progress Notes (Addendum)
After further assessment and discussion of patient's  status and medical condition during multidisciplinary rounds, the plan is outlined as below:  1.  Insulin drip will be discontinued.  2.  Advance diet as tolerated.  3.  Switch analgesia to p.o.  4.  Transfer to general medical ward.  5.  PCCM will sign off upon transfer.  Discussed with Dr. Margaretmary Eddy.

## 2019-06-19 NOTE — Consult Note (Signed)
PHARMACY CONSULT NOTE - FOLLOW UP  Pharmacy Consult for Electrolyte Monitoring and Replacement   Recent Labs: Potassium (mmol/L)  Date Value  06/19/2019 4.0  11/04/2014 3.6   Magnesium (mg/dL)  Date Value  06/19/2019 1.9  03/18/2014 1.9   Calcium (mg/dL)  Date Value  06/19/2019 8.7 (L)   Calcium, Total (mg/dL)  Date Value  11/04/2014 8.5   Albumin (g/dL)  Date Value  06/18/2019 3.7  11/01/2014 3.0 (L)   Phosphorus (mg/dL)  Date Value  01/21/2018 5.1 (H)   Sodium  Date Value  06/19/2019 132 mmol/L (L)  04/12/2018 129 (A)  11/04/2014 137 mmol/L    Assessment: Pharmacy consulted for electrolyte monitoring and replacement for 43 yo male admitted with hypertriglyceridemia and acute pancreatitis. Patient transitioned off of insulin infusion to [place] insulin with improving triglycerides.  Goal of Therapy:  Electrolytes WNL  Plan:  Hypokalemia on AM labs supplemented with 30 mEq oral potassium x2. Afternoon re-check indicates repletion. Magnesium level within normal limits.  Pharmacy will follow-up with AM labs and continue to replace as needed.   [place] Resident 06/19/2019 1:59 PM

## 2019-06-20 ENCOUNTER — Telehealth: Payer: Self-pay | Admitting: Internal Medicine

## 2019-06-20 LAB — GLUCOSE, CAPILLARY
Glucose-Capillary: 160 mg/dL — ABNORMAL HIGH (ref 70–99)
Glucose-Capillary: 222 mg/dL — ABNORMAL HIGH (ref 70–99)
Glucose-Capillary: 182 mg/dL — ABNORMAL HIGH (ref 70–99)

## 2019-06-20 LAB — BASIC METABOLIC PANEL
Anion gap: 13 (ref 5–15)
BUN: 8 mg/dL (ref 6–20)
CO2: 22 mmol/L (ref 22–32)
Calcium: 9.2 mg/dL (ref 8.9–10.3)
Chloride: 98 mmol/L (ref 98–111)
Creatinine, Ser: 0.97 mg/dL (ref 0.61–1.24)
GFR calc Af Amer: >60 mL/min (ref >60)
GFR calc non Af Amer: >60 mL/min (ref >60)
Glucose, Bld: 176 mg/dL — ABNORMAL HIGH (ref 70–99)
Potassium: 4.1 mmol/L (ref 3.5–5.1)
Sodium: 133 mmol/L — ABNORMAL LOW (ref 135–145)

## 2019-06-20 LAB — TRIGLYCERIDES: Triglycerides: 824 mg/dL — ABNORMAL HIGH (ref <150)

## 2019-06-20 LAB — LIPASE, BLOOD: Lipase: 132 U/L — ABNORMAL HIGH (ref 11–51)

## 2019-06-20 MED ORDER — OXYCODONE HCL 5 MG PO TABS: 5.0000 mg | ORAL_TABLET | ORAL | 0 refills | Status: DC | PRN

## 2019-06-20 MED ORDER — DOCUSATE SODIUM 100 MG PO CAPS: 100.0000 mg | ORAL_CAPSULE | Freq: Two times a day (BID) | ORAL | 0 refills | Status: DC

## 2019-06-20 MED ORDER — ROSUVASTATIN CALCIUM 10 MG PO TABS: 10.0000 mg | ORAL_TABLET | Freq: Every day | ORAL | Status: DC

## 2019-06-20 MED ORDER — GEMFIBROZIL 600 MG PO TABS: 600.0000 mg | ORAL_TABLET | Freq: Two times a day (BID) | ORAL | 0 refills | Status: DC

## 2019-06-20 MED ORDER — TOUJEO MAX SOLOSTAR 300 UNIT/ML ~~LOC~~ SOPN: 90.0000 [IU] | PEN_INJECTOR | Freq: Every day | SUBCUTANEOUS | 0 refills | Status: DC

## 2019-06-20 MED ORDER — ACETAMINOPHEN 325 MG PO TABS: 650.0000 mg | ORAL_TABLET | Freq: Four times a day (QID) | ORAL | Status: AC | PRN

## 2019-06-20 MED ORDER — GEMFIBROZIL 600 MG PO TABS
600.0000 mg | ORAL_TABLET | Freq: Two times a day (BID) | ORAL | Status: DC
  Filled 2019-06-20: qty 1

## 2019-06-20 MED ORDER — ROSUVASTATIN CALCIUM 10 MG PO TABS: 10.0000 mg | ORAL_TABLET | Freq: Every day | ORAL | 0 refills | Status: DC

## 2019-06-20 MED ORDER — OXYCODONE HCL 5 MG PO TABS: 5.0000 mg | ORAL_TABLET | Freq: Four times a day (QID) | ORAL | 0 refills | Status: DC | PRN

## 2019-06-20 MED ORDER — OMEGA-3-ACID ETHYL ESTERS 1 G PO CAPS: 3.0000 g | ORAL_CAPSULE | Freq: Two times a day (BID) | ORAL | 0 refills | Status: DC

## 2019-06-20 MED ORDER — TAMSULOSIN HCL 0.4 MG PO CAPS: 0.4000 mg | ORAL_CAPSULE | Freq: Every day | ORAL | 0 refills | Status: DC

## 2019-06-20 NOTE — Consult Note (Signed)
PHARMACY CONSULT NOTE - FOLLOW UP  Pharmacy Consult for Electrolyte Monitoring and Replacement   Recent Labs: Potassium (mmol/L)  Date Value  06/20/2019 4.1  11/04/2014 3.6   Magnesium (mg/dL)  Date Value  06/19/2019 1.9  03/18/2014 1.9   Calcium (mg/dL)  Date Value  06/20/2019 9.2   Calcium, Total (mg/dL)  Date Value  11/04/2014 8.5   Albumin (g/dL)  Date Value  06/18/2019 3.7  11/01/2014 3.0 (L)   Phosphorus (mg/dL)  Date Value  01/21/2018 5.1 (H)   Sodium  Date Value  06/20/2019 133 mmol/L (L)  04/12/2018 129 (A)  11/04/2014 137 mmol/L    Assessment: Pharmacy consulted for electrolyte monitoring and replacement for 43 yo male admitted with hypertriglyceridemia and acute pancreatitis. Patient transitioned off of insulin infusion to [place] insulin with improving triglycerides.  Electrolytes WNL  Goal of Therapy:  Electrolytes WNL  Plan:  No replacements are needed at this time.   Pharmacy will follow-up with AM labs and continue to replace as needed.   [place] Resident 06/20/2019 12:06 PM

## 2019-06-20 NOTE — Discharge Instructions (Signed)
Follow-up with primary care physician in 2 to 3 days Follow-up with endocrinologist Dr. Gabriel Carina as scheduled on 06/29/2019

## 2019-06-20 NOTE — TOC Benefit Eligibility Note (Signed)
Transition of Care [facility]) Benefit Eligibility Note    Patient Details  Name: DEANDRA ABEGGLEN MRN: [id number] Date of Birth: [birth year]   Medication/Dose: Gemfibrozil 600mg  BID  Covered?: Yes  Prescription Coverage Preferred Pharmacy: [facility]  Spoke with Person/Company/Phone Number:: Precious Bard with [employer] at [phone]  Co-Pay: $0 estimated copay  Prior Approval: No  Deductible: (Rep unable to provide deductible information.)    Dannette Barbara Phone Number: [phone] or [phone] 06/20/2019, 12:15 PM

## 2019-06-20 NOTE — Telephone Encounter (Signed)
[facility] called to schedule pt for an in-office Hospital Follow in 3-5 days.. Please call pt to schedule

## 2019-06-20 NOTE — TOC Benefit Eligibility Note (Signed)
Transition of Care [facility]) Benefit Eligibility Note    Patient Details  Name: RAMSSES STMARIE MRN: [id number] Date of Birth: 08-19-1976   Medication/Dose: Repatha SureClick  Covered?: Yes  Prescription Coverage Preferred Pharmacy: [facility]  Spoke with Person/Company/Phone Number:: Patti with [employer] at [phone]  Co-Pay: Unable to provide as PA required.  Prior Approval: [phone] option 3, option 2 for PA dept.)  Deductible: (Rep unable to give deductible information.)  Additional Notes: [place] limit may apply.    Dannette Barbara Phone Number: [phone] or [phone] 06/20/2019, 12:18 PM

## 2019-06-20 NOTE — Discharge Summary (Signed)
[place] [facility] [place] NAME: Sergio [name]    MR#:  [id number]  DATE OF BIRTH:  Dec 13, 1975  DATE OF ADMISSION:  06/15/2019 ADMITTING PHYSICIAN: Harrie Foreman, MD  DATE OF DISCHARGE:  06/20/19   PRIMARY CARE PHYSICIAN: Crecencio Mc, MD    ADMISSION DIAGNOSIS:  RUQ pain [R10.11] Elevated liver enzymes [R74.8] Acute pancreatitis, unspecified complication status, unspecified pancreatitis type [K85.90]  DISCHARGE DIAGNOSIS:  Active Problems:   Pancreatitis   Elevated liver enzymes Hypertriglyceridemia  SECONDARY DIAGNOSIS:   Past Medical History:  Diagnosis Date  . Anxiety   . Chicken pox   . Depression   . Diabetes mellitus    DM2  . Familial hypertriglyceridemia    severe  . Gastric ulcer 2009  . HTN (hypertension)   . Morbid obesity ([place])    s/p bariatric sleeve surgery 01/2015  . Recurrent acute pancreatitis    secondary to hypertriglyceridemia   . Sleep apnea 1999   uses CPAP    HOSPITAL COURSE:  HPI: The patient with past medical history of hypertriglyceridemia, diabetes and sleep apnea presents to the emergency department complaining of abdominal pain.  The patient is well-known to our service and is well aware of the symptoms of pancreatitis.  He is also aware when his triglycerides are elevated due to the eruption of xanthomas and generalized stiffness of his joints.  Laboratory evaluation revealed triglycerides of 354.  Lipase elevated at 125 as well as elevations of AST and ALT.  The patient was given IV pain medication as well as antiemetics prior to the emergency department staff called the hospitalist service for admission.  1.  AcutePancreatitis: Secondary to hypertriglyceridemia.   insulin drip as well as D5 given and clinically improved Continue to monitor triglycerides.  [id number] Dilaudid IV discontinued as pain significantly improved will give him Percocet as needed patient is  agreeable Patient is tolerating advanced diet and wants to go home I have discussed with the patient's endocrinologist Dr. Gabriel Carina over phone okay to discharge patient from her standpoint recommending to continue Lopid, Crestor and Lovaza Patient is to follow-up with Dr. Gabriel Carina on September 18 as scheduled  2. Hypertriglyceridemia: With history of familial hypercholesterolemia triglycerides are higher.  Continue close monitoring Consider addition of Repatha but it is an expensive medicine.  Follow-up with endocrinology  continue Lovaza, Lopid and Crestor and pancreatic enzymes. Oxycodone as needed for pain dispense 20 with no refills   3. Diabetes mellitus type 2: Patient is on insulin drip for hypertriglyceridemia  insulin drip and D5 is discontinued and resumed patient's 36 units of Levemir twice a day Patient is resumed on Lantus at 75 units nightly Continue Lyrica for neuropathy . 4. Hypertension: Controlled; continue to monitor   5.  Pseudohyponatremia from hypertriglyceridemia Continue close monitoring patient is awake and alert  Sodium at [id number]  6.  Hypokalemia repleted with potassium supplements potassium is at 4.1   Discharge patient home DISCHARGE CONDITIONS:   fair  CONSULTS OBTAINED:     PROCEDURES  None   DRUG ALLERGIES:   Allergies  Allergen Reactions  . Iodinated Diagnostic Agents Other (See Comments) and Anaphylaxis    Kidneys stop working KIDNEYS SHUT DOWN    DISCHARGE MEDICATIONS:   Allergies as of 06/20/2019      Reactions   Iodinated Diagnostic Agents Other (See Comments), Anaphylaxis   Kidneys stop working KIDNEYS SHUT DOWN      Medication List    STOP taking  these medications   diclofenac 75 MG EC tablet Commonly known as: VOLTAREN     TAKE these medications   acetaminophen 325 MG tablet Commonly known as: TYLENOL Take 2 tablets (650 mg total) by mouth every 6 (six) hours as needed for mild pain (or Fever >/= 101).    ALPRAZolam 0.5 MG tablet Commonly known as: XANAX TAKE 1 TABLET BY MOUTH TWICE DAILY AS NEEDED FOR ANXIETY What changed:   reasons to take this  additional instructions   docusate sodium 100 MG capsule Commonly known as: COLACE Take 1 capsule (100 mg total) by mouth 2 (two) times daily.   gemfibrozil 600 MG tablet Commonly known as: LOPID Take 1 tablet (600 mg total) by mouth 2 (two) times daily before a meal.   NovoLOG 100 UNIT/ML injection Generic drug: insulin aspart Inject 1 Dose into the skin See admin instructions. Per sliding scale   omega-3 acid ethyl esters 1 g capsule Commonly known as: LOVAZA Take 3 capsules (3 g total) by mouth 2 (two) times daily.   oxyCODONE 5 MG immediate release tablet Commonly known as: Roxicodone Take 1 tablet (5 mg total) by mouth every 6 (six) hours as needed for moderate pain, severe pain or breakthrough pain.   Pancrelipase (Lip-Prot-Amyl) 40000-126000 units Cpep Commonly known as: Zenpep Take 1 capsule by mouth 5 (five) times daily. Before meals and  snacks What changed:   how much to take  when to take this  additional instructions   pregabalin 300 MG capsule Commonly known as: LYRICA Take 300 mg by mouth at bedtime.   rosuvastatin 10 MG tablet Commonly known as: CRESTOR Take 1 tablet (10 mg total) by mouth daily at 6 PM.   tamsulosin 0.4 MG Caps capsule Commonly known as: FLOMAX Take 1 capsule (0.4 mg total) by mouth daily. Start taking on: June 21, 2019   Toujeo Max SoloStar 300 UNIT/ML Sopn Generic drug: Insulin Glargine (2 Unit Dial) Inject 90 Units into the skin at bedtime.   traZODone 100 MG tablet Commonly known as: DESYREL Take 2 tablets (200 mg total) by mouth daily as needed for sleep. What changed:   how much to take  when to take this   Trulicity 1.5 0000000 Sopn Generic drug: Dulaglutide Inject 1.5 mg into the skin every Monday.        DISCHARGE INSTRUCTIONS:   Follow-up with  primary care physician in 2 to 3 days Follow-up with endocrinologist Dr. Gabriel Carina as scheduled on 06/29/2019  DIET:  Cardiac diet, carb modified  DISCHARGE CONDITION:  Fair  ACTIVITY:  Activity as tolerated  OXYGEN:  Home Oxygen: No.   Oxygen Delivery: room air  DISCHARGE LOCATION:  home   If you experience worsening of your admission symptoms, develop shortness of breath, life threatening emergency, suicidal or homicidal thoughts you must seek medical attention immediately by calling 911 or calling your MD immediately  if symptoms less severe.  You Must read complete instructions/literature along with all the possible adverse reactions/side effects for all the Medicines you take and that have been prescribed to you. Take any new Medicines after you have completely understood and accpet all the possible adverse reactions/side effects.   Please note  You were cared for by a hospitalist during your hospital stay. If you have any questions about your discharge medications or the care you received while you were in the hospital after you are discharged, you can call the unit and asked to speak with the hospitalist on call if  the hospitalist that took care of you is not available. Once you are discharged, your primary care physician will handle any further medical issues. Please note that NO REFILLS for any discharge medications will be authorized once you are discharged, as it is imperative that you return to your primary care physician (or establish a relationship with a primary care physician if you do not have one) for your aftercare needs so that they can reassess your need for medications and monitor your lab values.     Today  Chief Complaint  Patient presents with  . Abdominal Pain   Patient is doing fine denies any abdominal pain tolerating advanced diet  ROS:  CONSTITUTIONAL: Denies fevers, chills. Denies any fatigue, weakness.  EYES: Denies blurry vision, double vision, eye  pain. EARS, NOSE, THROAT: Denies tinnitus, ear pain, hearing loss. RESPIRATORY: Denies cough, wheeze, shortness of breath.  CARDIOVASCULAR: Denies chest pain, palpitations, edema.  GASTROINTESTINAL: Denies nausea, vomiting, diarrhea, abdominal pain. Denies bright red blood per rectum. GENITOURINARY: Denies dysuria, hematuria. ENDOCRINE: Denies nocturia or thyroid problems. HEMATOLOGIC AND LYMPHATIC: Denies easy bruising or bleeding. SKIN: Denies rash or lesion. MUSCULOSKELETAL: Denies pain in neck, back, shoulder, knees, hips or arthritic symptoms.  NEUROLOGIC: Denies paralysis, paresthesias.  PSYCHIATRIC: Denies anxiety or depressive symptoms.   VITAL SIGNS:  Blood pressure (!) 128/91, pulse 78, temperature 98.3 F (36.8 C), resp. rate 17, height 6' (1.829 m), weight 112.5 kg, SpO2 96 %.  I/O:    Intake/Output Summary (Last 24 hours) at 06/20/2019 1430 Last data filed at 06/20/2019 1300 Gross per 24 hour  Intake 480 ml  Output -  Net 480 ml    PHYSICAL EXAMINATION:  GENERAL:  43 y.o.-year-old patient lying in the bed with no acute distress.  EYES: Pupils equal, round, reactive to light and accommodation. No scleral icterus. Extraocular muscles intact.  HEENT: Head atraumatic, normocephalic. Oropharynx and nasopharynx clear.  NECK:  Supple, no jugular venous distention. No thyroid enlargement, no tenderness.  LUNGS: Normal breath sounds bilaterally, no wheezing, rales,rhonchi or crepitation. No use of accessory muscles of respiration.  CARDIOVASCULAR: S1, S2 normal. No murmurs, rubs, or gallops.  ABDOMEN: Soft, non-tender, non-distended. Bowel sounds present.  EXTREMITIES: No pedal edema, cyanosis, or clubbing.  NEUROLOGIC: Cranial nerves II through XII are intact. Muscle strength 5/5 in all extremities. Sensation intact. Gait not checked.  PSYCHIATRIC: The patient is alert and oriented x 3.  SKIN: No obvious rash, lesion, or ulcer.   DATA REVIEW:   CBC Recent Labs  Lab  06/19/19 0311  WBC 3.2*  HGB 14.4  HCT 43.7  PLT 173    Chemistries  Recent Labs  Lab 06/18/19 0529  06/19/19 1219 06/20/19 0802  NA 133*   < >  --  133*  K 3.6   < > 4.0 4.1  CL 95*   < >  --  98  CO2 29   < >  --  22  GLUCOSE 72   < >  --  176*  BUN 6   < >  --  8  CREATININE 0.88   < >  --  0.97  CALCIUM 8.8*   < >  --  9.2  MG  --   --  1.9  --   AST 207*  --   --   --   ALT 283*  --   --   --   ALKPHOS 116  --   --   --   BILITOT 5.2*  --   --   --    < > =  values in this interval not displayed.    Cardiac Enzymes No results for input(s): TROPONINI in the last 168 hours.  Microbiology Results  Results for orders placed or performed during the hospital encounter of 06/15/19  SARS Coronavirus 2 [facility], [place] order, Performed in [facility] hospital lab) Nasopharyngeal Nasopharyngeal Swab     Status: None   Collection Time: 06/15/19  5:13 AM   Specimen: Nasopharyngeal Swab  Result Value Ref Range Status   SARS Coronavirus 2 NEGATIVE NEGATIVE Final    Comment: (NOTE) If result is NEGATIVE SARS-CoV-2 target nucleic acids are NOT DETECTED. The SARS-CoV-2 RNA is generally detectable in upper and lower  respiratory specimens during the acute phase of infection. The lowest  concentration of SARS-CoV-2 viral copies this assay can detect is 250  copies / mL. A negative result does not preclude SARS-CoV-2 infection  and should not be used as the sole basis for treatment or other  patient management decisions.  A negative result may occur with  improper specimen collection / handling, submission of specimen other  than nasopharyngeal swab, presence of viral mutation(s) within the  areas targeted by this assay, and inadequate number of viral copies  (<250 copies / mL). A negative result must be combined with clinical  observations, patient history, and epidemiological information. If result is POSITIVE SARS-CoV-2 target nucleic acids are DETECTED. The SARS-CoV-2 RNA is  generally detectable in upper and lower  respiratory specimens dur ing the acute phase of infection.  Positive  results are indicative of active infection with SARS-CoV-2.  Clinical  correlation with patient history and other diagnostic information is  necessary to determine patient infection status.  Positive results do  not rule out bacterial infection or co-infection with other viruses. If result is PRESUMPTIVE POSTIVE SARS-CoV-2 nucleic acids MAY BE PRESENT.   A presumptive positive result was obtained on the submitted specimen  and confirmed on repeat testing.  While 2019 novel coronavirus  (SARS-CoV-2) nucleic acids may be present in the submitted sample  additional confirmatory testing may be necessary for epidemiological  and / or clinical management purposes  to differentiate between  SARS-CoV-2 and other Sarbecovirus currently known to infect humans.  If clinically indicated additional testing with an alternate test  methodology [id number]) is advised. The SARS-CoV-2 RNA is generally  detectable in upper and lower respiratory sp ecimens during the acute  phase of infection. The expected result is Negative. Fact Sheet for Patients:  StrictlyIdeas.no Fact Sheet for Healthcare Providers: BankingDealers.co.za This test is not yet approved or cleared by the [place] FDA and has been authorized for detection and/or diagnosis of SARS-CoV-2 by FDA under an Emergency Use Authorization (EUA).  This EUA will remain in effect (meaning this test can be used) for the duration of the COVID-19 declaration under Section 564(b)(1) of the Act, 21 U.S.C. section 360bbb-3(b)(1), unless the authorization is terminated or revoked sooner. Performed at [facility], [address] [place], [place] [address]   MRSA PCR Screening     Status: None   Collection Time: 06/16/19 10:58 AM   Specimen: Nasopharyngeal  Result Value Ref Range  Status   MRSA by PCR NEGATIVE NEGATIVE Final    Comment:        The GeneXpert MRSA Assay (FDA approved for NASAL specimens only), is one component of a comprehensive MRSA colonization surveillance program. It is not intended to diagnose MRSA infection nor to guide or monitor treatment for MRSA infections. Performed at [facility] [address] [place],  [address]     RADIOLOGY:  Dg Abd 1 View  Result Date: 06/16/2019 CLINICAL DATA:  Vomiting EXAM: ABDOMEN - 1 VIEW COMPARISON:  CT abdomen pelvis 01/15/2018 FINDINGS: There are a few scattered nondilated loops of bowel with air. Overall paucity of bowel gas limits evaluation for obstruction but there are no dilated loops present. No evidence for free air on supine radiograph. Low lung volumes with bronchovascular crowding. No acute abnormality in the visualized skeleton. IMPRESSION: Nonobstructive bowel gas pattern, evaluation somewhat limited by the overall paucity of bowel gas. Electronically Signed   By: Audie Pinto M.D.   On: 06/16/2019 19:03    EKG:   Orders placed or performed during the hospital encounter of 06/15/19  . EKG 12-Lead  . EKG 12-Lead  . EKG 12-Lead  . EKG 12-Lead  . EKG      Management plans discussed with the patient, she is  in agreement.  CODE STATUS:     Code Status Orders  (From admission, onward)         Start     Ordered   06/15/19 1033  Full code  Continuous     06/15/19 1032        Code Status History    Date Active Date Inactive Code Status Order ID Comments User Context   01/15/2018 0926 01/22/2018 1939 Full Code [id number]  Bettey Costa, MD Inpatient   10/16/2017 0753 10/23/2017 1931 Full Code [id number]  Saundra Shelling, MD Inpatient   05/13/2017 1524 05/23/2017 2006 Full Code [id number]  [name], MD Inpatient   01/28/2017 0840 02/03/2017 1543 Full Code [id number]  Harrie Foreman, MD Inpatient   07/24/2016 2251 08/04/2016 1837 Full Code [id number]  Theodoro Grist,  MD Inpatient   03/11/2016 0458 03/19/2016 1644 Full Code [id number]  Harrie Foreman, MD Inpatient   01/17/2016 0250 01/27/2016 1747 Full Code [id number]  Harrie Foreman, MD Inpatient   08/01/2015 2335 08/15/2015 1643 Full Code [id number]  Lytle Butte, MD ED   07/21/2014 1146 07/27/2014 1816 Full Code [id number]  Oswald Hillock, MD Inpatient   Advance Care Planning Activity      TOTAL TIME TAKING CARE OF THIS PATIENT: 45  minutes.   Note: This dictation was prepared with Dragon dictation along with smaller phrase technology. Any transcriptional errors that result from this process are unintentional.   @[facility] @  on 06/20/2019 at 2:30 PM  Between 7am to 6pm - Pager - [phone]  After 6pm go to [email or web address] - password [email or web address] [facility]  Office  [phone]  CC: Primary care physician; Crecencio Mc, MD

## 2019-06-20 NOTE — TOC Transition Note (Signed)
Transition of Care [facility]) - CM/SW Discharge Note   Patient Details  Name: Sergio [name] MRN: [id number] Date of Birth: August 23, 1976  [facility] CM/SW Contact:  Shelbie Hutching, RN Phone Number: 06/20/2019, 3:02 PM   Clinical Narrative:     Patient has no discharge needs.  Patient is independent and can afford his medications.  Patient will be discharged on gemfibrozil 600 mg BID- patient has $0 copay for this medication.  Patient reports that he has been dealing with pancreatitis for 11 years now, he follows closely with endocrinology and internal medicine outpatient.    Final next level of care: Home/Self Care Barriers to Discharge: Barriers Resolved   Patient Goals and CMS Choice Patient states their goals for this hospitalization and ongoing recovery are:: Wants adequate pain medication for discharge      Discharge Placement                       Discharge Plan and Services                                     Social Determinants of Health (SDOH) Interventions     Readmission Risk Interventions No flowsheet data found.

## 2019-06-20 NOTE — Telephone Encounter (Signed)
Waiting on discharge paperwork so we can call pt and schedule the appt.

## 2019-06-28 NOTE — Telephone Encounter (Signed)
LMTCB

## 2019-07-06 NOTE — Telephone Encounter (Signed)
Spoke with pt and he stated that he is feeling fine and does not think that he needs to schedule a hospital follow up with Dr. Derrel Nip. He stated that she is out of network with his insurance for now but that if he needs her he will call to schedule an appt.

## 2019-08-23 LAB — HM DIABETES EYE EXAM

## 2019-08-28 ENCOUNTER — Encounter: Payer: Self-pay | Admitting: Internal Medicine

## 2019-11-26 ENCOUNTER — Emergency Department: Payer: BLUE CROSS/BLUE SHIELD

## 2019-11-26 ENCOUNTER — Inpatient Hospital Stay
Admission: EM | Admit: 2019-11-26 | Discharge: 2019-12-03 | DRG: 439 | Disposition: A | Discharge status: Home / Self Care | Payer: BLUE CROSS/BLUE SHIELD | Attending: Internal Medicine | Admitting: Internal Medicine

## 2019-11-26 ENCOUNTER — Encounter: Payer: Self-pay | Admitting: Emergency Medicine

## 2019-11-26 ENCOUNTER — Other Ambulatory Visit: Payer: Self-pay

## 2019-11-26 DIAGNOSIS — K858 Other acute pancreatitis without necrosis or infection: Principal | ICD-10-CM

## 2019-11-26 DIAGNOSIS — E1165 Type 2 diabetes mellitus with hyperglycemia: Secondary | ICD-10-CM | POA: Diagnosis present

## 2019-11-26 DIAGNOSIS — E11649 Type 2 diabetes mellitus with hypoglycemia without coma: Secondary | ICD-10-CM | POA: Diagnosis not present

## 2019-11-26 DIAGNOSIS — E878 Other disorders of electrolyte and fluid balance, not elsewhere classified: Secondary | ICD-10-CM | POA: Diagnosis present

## 2019-11-26 DIAGNOSIS — F418 Other specified anxiety disorders: Secondary | ICD-10-CM | POA: Diagnosis present

## 2019-11-26 DIAGNOSIS — E871 Hypo-osmolality and hyponatremia: Secondary | ICD-10-CM | POA: Diagnosis present

## 2019-11-26 DIAGNOSIS — E781 Pure hyperglyceridemia: Secondary | ICD-10-CM | POA: Diagnosis present

## 2019-11-26 DIAGNOSIS — R109 Unspecified abdominal pain: Secondary | ICD-10-CM | POA: Diagnosis not present

## 2019-11-26 DIAGNOSIS — R1013 Epigastric pain: Secondary | ICD-10-CM

## 2019-11-26 DIAGNOSIS — Z79891 Long term (current) use of opiate analgesic: Secondary | ICD-10-CM

## 2019-11-26 DIAGNOSIS — N400 Enlarged prostate without lower urinary tract symptoms: Secondary | ICD-10-CM | POA: Diagnosis present

## 2019-11-26 DIAGNOSIS — Z91041 Radiographic dye allergy status: Secondary | ICD-10-CM

## 2019-11-26 DIAGNOSIS — Z79899 Other long term (current) drug therapy: Secondary | ICD-10-CM | POA: Diagnosis not present

## 2019-11-26 DIAGNOSIS — G4733 Obstructive sleep apnea (adult) (pediatric): Secondary | ICD-10-CM | POA: Diagnosis present

## 2019-11-26 DIAGNOSIS — Z885 Allergy status to narcotic agent status: Secondary | ICD-10-CM | POA: Diagnosis not present

## 2019-11-26 DIAGNOSIS — Z8249 Family history of ischemic heart disease and other diseases of the circulatory system: Secondary | ICD-10-CM

## 2019-11-26 DIAGNOSIS — Z9049 Acquired absence of other specified parts of digestive tract: Secondary | ICD-10-CM

## 2019-11-26 DIAGNOSIS — E876 Hypokalemia: Secondary | ICD-10-CM | POA: Diagnosis not present

## 2019-11-26 DIAGNOSIS — G629 Polyneuropathy, unspecified: Secondary | ICD-10-CM | POA: Diagnosis present

## 2019-11-26 DIAGNOSIS — Z6833 Body mass index (BMI) 33.0-33.9, adult: Secondary | ICD-10-CM | POA: Diagnosis not present

## 2019-11-26 DIAGNOSIS — K859 Acute pancreatitis without necrosis or infection, unspecified: Secondary | ICD-10-CM | POA: Diagnosis not present

## 2019-11-26 DIAGNOSIS — R739 Hyperglycemia, unspecified: Secondary | ICD-10-CM

## 2019-11-26 DIAGNOSIS — K760 Fatty (change of) liver, not elsewhere classified: Secondary | ICD-10-CM | POA: Diagnosis present

## 2019-11-26 DIAGNOSIS — Z794 Long term (current) use of insulin: Secondary | ICD-10-CM | POA: Diagnosis not present

## 2019-11-26 DIAGNOSIS — I10 Essential (primary) hypertension: Secondary | ICD-10-CM | POA: Diagnosis present

## 2019-11-26 DIAGNOSIS — E118 Type 2 diabetes mellitus with unspecified complications: Secondary | ICD-10-CM | POA: Diagnosis not present

## 2019-11-26 LAB — COMPREHENSIVE METABOLIC PANEL
ALT: 41 U/L (ref 0–44)
AST: 34 U/L (ref 15–41)
Albumin: 4.8 g/dL (ref 3.5–5.0)
Alkaline Phosphatase: 50 U/L (ref 38–126)
Anion gap: 10 (ref 5–15)
BUN: 9 mg/dL (ref 6–20)
CO2: 23 mmol/L (ref 22–32)
Calcium: 8.3 mg/dL — ABNORMAL LOW (ref 8.9–10.3)
Chloride: 91 mmol/L — ABNORMAL LOW (ref 98–111)
Creatinine, Ser: 0.41 mg/dL — ABNORMAL LOW (ref 0.61–1.24)
GFR calc Af Amer: >60 mL/min (ref >60)
GFR calc non Af Amer: >60 mL/min (ref >60)
Glucose, Bld: 236 mg/dL — ABNORMAL HIGH (ref 70–99)
Potassium: 3.7 mmol/L (ref 3.5–5.1)
Sodium: 124 mmol/L — ABNORMAL LOW (ref 135–145)
Total Bilirubin: 2.6 mg/dL — ABNORMAL HIGH (ref 0.3–1.2)
Total Protein: 5.9 g/dL — ABNORMAL LOW (ref 6.5–8.1)

## 2019-11-26 LAB — GLUCOSE, CAPILLARY
Glucose-Capillary: 190 mg/dL — ABNORMAL HIGH (ref 70–99)
Glucose-Capillary: 127 mg/dL — ABNORMAL HIGH (ref 70–99)
Glucose-Capillary: 110 mg/dL — ABNORMAL HIGH (ref 70–99)

## 2019-11-26 LAB — CBC
HCT: 45.7 % (ref 39.0–52.0)
Hemoglobin: 15.9 g/dL (ref 13.0–17.0)
MCH: 28 pg (ref 26.0–34.0)
MCHC: 34.8 g/dL (ref 30.0–36.0)
MCV: 80.6 fL (ref 80.0–100.0)
Platelets: 223 10*3/uL (ref 150–400)
RBC: 5.67 MIL/uL (ref 4.22–5.81)
RDW: 14.3 % (ref 11.5–15.5)
WBC: 5.7 10*3/uL (ref 4.0–10.5)
nRBC: 0.7 % — ABNORMAL HIGH (ref 0.0–0.2)

## 2019-11-26 LAB — LIPASE, BLOOD: Lipase: 478 U/L — ABNORMAL HIGH (ref 11–51)

## 2019-11-26 LAB — TRIGLYCERIDES: Triglycerides: >5000 mg/dL — ABNORMAL HIGH (ref <150)

## 2019-11-26 MED ORDER — HYDROMORPHONE HCL 1 MG/ML IJ SOLN
1.0000 mg | INTRAMUSCULAR | Status: AC
  Administered 2019-11-26 (×2): 1 mg via INTRAVENOUS
  Filled 2019-11-26 (×2): qty 1

## 2019-11-26 MED ORDER — SODIUM CHLORIDE 0.9 % IV BOLUS
1000.0000 mL | Freq: Once | INTRAVENOUS | Status: AC
  Administered 2019-11-26: 17:00:00 1000 mL via INTRAVENOUS

## 2019-11-26 MED ORDER — INSULIN REGULAR(HUMAN) IN NACL 100-0.9 UT/100ML-% IV SOLN: INTRAVENOUS | Status: DC

## 2019-11-26 MED ORDER — MAGNESIUM HYDROXIDE 400 MG/5ML PO SUSP: 30.0000 mL | Freq: Every day | ORAL | Status: DC | PRN

## 2019-11-26 MED ORDER — ROSUVASTATIN CALCIUM 10 MG PO TABS
10.0000 mg | ORAL_TABLET | Freq: Every day | ORAL | Status: DC
  Administered 2019-11-27 – 2019-12-02 (×6): 10 mg via ORAL
  Filled 2019-11-26 (×6): qty 1

## 2019-11-26 MED ORDER — PREGABALIN 75 MG PO CAPS
300.0000 mg | ORAL_CAPSULE | Freq: Every day | ORAL | Status: DC
  Administered 2019-11-26 – 2019-12-02 (×7): 300 mg via ORAL
  Filled 2019-11-26 (×7): qty 4

## 2019-11-26 MED ORDER — TRAZODONE HCL 50 MG PO TABS: 25.0000 mg | ORAL_TABLET | Freq: Every evening | ORAL | Status: DC | PRN

## 2019-11-26 MED ORDER — PANCRELIPASE (LIP-PROT-AMYL) 40000-126000 UNITS PO CPEP: 1.0000 | ORAL_CAPSULE | ORAL | Status: DC

## 2019-11-26 MED ORDER — DEXTROSE 5 % IV SOLN: Freq: Once | INTRAVENOUS | Status: AC

## 2019-11-26 MED ORDER — GEMFIBROZIL 600 MG PO TABS
600.0000 mg | ORAL_TABLET | Freq: Two times a day (BID) | ORAL | Status: DC
  Administered 2019-11-27 – 2019-12-03 (×12): 600 mg via ORAL
  Filled 2019-11-26 (×12): qty 1

## 2019-11-26 MED ORDER — HYDROMORPHONE HCL 1 MG/ML IJ SOLN: 1.0000 mg | INTRAMUSCULAR | Status: DC | PRN

## 2019-11-26 MED ORDER — ONDANSETRON HCL 4 MG/2ML IJ SOLN
4.0000 mg | Freq: Four times a day (QID) | INTRAMUSCULAR | Status: DC | PRN
  Administered 2019-11-26 – 2019-12-02 (×9): 4 mg via INTRAVENOUS
  Filled 2019-11-26 (×10): qty 2

## 2019-11-26 MED ORDER — ENOXAPARIN SODIUM 40 MG/0.4ML ~~LOC~~ SOLN
40.0000 mg | SUBCUTANEOUS | Status: DC
  Administered 2019-11-26 – 2019-12-02 (×7): 40 mg via SUBCUTANEOUS
  Filled 2019-11-26 (×7): qty 0.4

## 2019-11-26 MED ORDER — DEXTROSE 50 % IV SOLN
0.0000 mL | INTRAVENOUS | Status: DC | PRN
  Filled 2019-11-26: qty 50

## 2019-11-26 MED ORDER — PROMETHAZINE HCL 25 MG/ML IJ SOLN
12.5000 mg | Freq: Four times a day (QID) | INTRAMUSCULAR | Status: DC | PRN
  Administered 2019-11-26 – 2019-12-03 (×11): 12.5 mg via INTRAVENOUS
  Filled 2019-11-26 (×12): qty 1

## 2019-11-26 MED ORDER — ONDANSETRON 4 MG PO TBDP
4.0000 mg | ORAL_TABLET | Freq: Once | ORAL | Status: AC | PRN
  Administered 2019-11-26: 15:00:00 4 mg via ORAL
  Filled 2019-11-26: qty 1

## 2019-11-26 MED ORDER — DOCUSATE SODIUM 100 MG PO CAPS
100.0000 mg | ORAL_CAPSULE | Freq: Two times a day (BID) | ORAL | Status: DC
  Administered 2019-11-30: 100 mg via ORAL
  Filled 2019-11-26 (×6): qty 1

## 2019-11-26 MED ORDER — TRAZODONE HCL 100 MG PO TABS: 100.0000 mg | ORAL_TABLET | Freq: Every evening | ORAL | Status: DC | PRN

## 2019-11-26 MED ORDER — DEXTROSE-NACL 5-0.45 % IV SOLN: INTRAVENOUS | Status: DC

## 2019-11-26 MED ORDER — SODIUM CHLORIDE 0.9% FLUSH: 3.0000 mL | Freq: Once | INTRAVENOUS | Status: DC

## 2019-11-26 MED ORDER — OMEGA-3-ACID ETHYL ESTERS 1 G PO CAPS
3.0000 g | ORAL_CAPSULE | Freq: Two times a day (BID) | ORAL | Status: DC
  Administered 2019-11-26 – 2019-12-03 (×14): 3 g via ORAL
  Filled 2019-11-26 (×15): qty 3

## 2019-11-26 MED ORDER — MORPHINE SULFATE (PF) 2 MG/ML IV SOLN
2.0000 mg | INTRAVENOUS | Status: DC | PRN
  Administered 2019-11-26 – 2019-11-30 (×3): 2 mg via INTRAVENOUS
  Filled 2019-11-26 (×3): qty 1

## 2019-11-26 MED ORDER — INSULIN (MYXREDLIN) INFUSION FOR HYPERTRIGLYCERIDEMIA
0.2000 [IU]/kg/h | INTRAVENOUS | Status: DC
  Administered 2019-11-26 – 2019-12-03 (×34): 0.2 [IU]/kg/h via INTRAVENOUS
  Filled 2019-11-26 (×35): qty 100

## 2019-11-26 MED ORDER — TAMSULOSIN HCL 0.4 MG PO CAPS
0.4000 mg | ORAL_CAPSULE | Freq: Every day | ORAL | Status: DC
  Administered 2019-11-27 – 2019-12-03 (×7): 0.4 mg via ORAL
  Filled 2019-11-26 (×7): qty 1

## 2019-11-26 MED ORDER — ACETAMINOPHEN 650 MG RE SUPP: 650.0000 mg | Freq: Four times a day (QID) | RECTAL | Status: DC | PRN

## 2019-11-26 MED ORDER — HYDROMORPHONE HCL 1 MG/ML IJ SOLN
1.0000 mg | INTRAMUSCULAR | Status: DC | PRN
  Administered 2019-11-26: 23:00:00 1 mg via INTRAVENOUS
  Filled 2019-11-26: qty 1

## 2019-11-26 MED ORDER — ACETAMINOPHEN 325 MG PO TABS
650.0000 mg | ORAL_TABLET | Freq: Four times a day (QID) | ORAL | Status: DC | PRN
  Filled 2019-11-26: qty 2

## 2019-11-26 MED ORDER — SODIUM CHLORIDE 0.9 % IV SOLN: INTRAVENOUS | Status: DC

## 2019-11-26 MED ORDER — ONDANSETRON HCL 4 MG PO TABS
4.0000 mg | ORAL_TABLET | Freq: Four times a day (QID) | ORAL | Status: DC | PRN
  Administered 2019-11-27 – 2019-12-01 (×2): 4 mg via ORAL
  Filled 2019-11-26 (×2): qty 1

## 2019-11-26 MED ORDER — ALPRAZOLAM 0.5 MG PO TABS
0.5000 mg | ORAL_TABLET | Freq: Two times a day (BID) | ORAL | Status: DC | PRN
  Administered 2019-11-29 – 2019-12-02 (×6): 0.5 mg via ORAL
  Filled 2019-11-26 (×6): qty 1

## 2019-11-26 NOTE — ED Triage Notes (Signed)
Pt presents to ED via POV with c/o LUQ abdominal pain since Satuday. Pt states pain radiates through to this back. Pt states hx of hypertriglyceridemia which causes frequent episodes of pancreatitis. Pt states 2 episodes of vomiting today.

## 2019-11-26 NOTE — ED Provider Notes (Signed)
[facility] Emergency Department Provider Note    First MD Initiated Contact with Patient 11/26/19 1706     (approximate)  I have reviewed the triage vital signs and the nursing notes.   HISTORY  Chief Complaint Abdominal Pain    HPI Sergio [name] is a 44 y.o. male the below listed past medical history represents to the ER for 48 hours of progressively worsening epigastric pain radiating through to his back.  Somewhat similar to previous episodes of his recurrent triglyceride induced pancreatitis but felt like it was more sudden in onset.  He is not been able to tolerate p.o.  No measured fevers.  Denies any chest pain or shortness of breath.    Past Medical History:  Diagnosis Date  . Anxiety   . Chicken pox   . Depression   . Diabetes mellitus    DM2  . Familial hypertriglyceridemia    severe  . Gastric ulcer 2009  . HTN (hypertension)   . Morbid obesity ([place])    s/p bariatric sleeve surgery 01/2015  . Recurrent acute pancreatitis    secondary to hypertriglyceridemia   . Sleep apnea 1999   uses CPAP   Family History  Adopted: Yes  Problem Relation Age of Onset  . Hypertension Mother   . Hyperlipidemia Mother   . Hypertension Father   . Hyperlipidemia Father    Past Surgical History:  Procedure Laterality Date  . CHOLECYSTECTOMY    . LAPAROSCOPIC GASTRIC SLEEVE RESECTION    . [place] SURGERY  2008  . TONSILLECTOMY  2003  . VASECTOMY     Patient Active Problem List   Diagnosis Date Noted  . Elevated liver enzymes   . Pancreatitis 06/15/2019  . Acute pancreatitis 01/15/2018  . Nasal sore 11/29/2017  . Hospital discharge follow-up 08/10/2016  . Sixth nerve palsy of left eye   . Pancreatic insufficiency 07/24/2016  . Hyponatremia 07/24/2016  . Encounter for preventive health examination 05/01/2016  . Depression, major, recurrent, moderate ([place]) 03/14/2016  . S/P bariatric surgery 02/01/2015  . S/P laparoscopic  cholecystectomy 02/01/2015  . Diarrhea 05/29/2012  . Recurrent pancreatitis 05/29/2012  . Generalized anxiety disorder 04/29/2012  . Obesity (BMI 30-39.9) 04/29/2012  . DM (diabetes mellitus), type 2, uncontrolled w/neurologic complication ([place])   . Hypertriglyceridemia   . HTN (hypertension)   . Sleep apnea       Prior to Admission medications   Medication Sig Start Date End Date Taking? Authorizing Provider  acetaminophen (TYLENOL) 325 MG tablet Take 2 tablets (650 mg total) by mouth every 6 (six) hours as needed for mild pain (or Fever >/= 101). 06/20/19   Gouru, Aruna, MD  ALPRAZolam (XANAX) 0.5 MG tablet TAKE 1 TABLET BY MOUTH TWICE DAILY AS NEEDED FOR ANXIETY Patient taking differently: Take 0.5 mg by mouth 2 (two) times daily as needed for anxiety.  09/20/18   Crecencio Mc, MD  docusate sodium (COLACE) 100 MG capsule Take 1 capsule (100 mg total) by mouth 2 (two) times daily. 06/20/19   [name], MD  gemfibrozil (LOPID) 600 MG tablet Take 1 tablet (600 mg total) by mouth 2 (two) times daily before a meal. 06/20/19   Gouru, Aruna, MD  insulin aspart (NOVOLOG) 100 UNIT/ML injection Inject 1 Dose into the skin See admin instructions. Per sliding scale 09/11/15   [provider]  Insulin Glargine, 2 Unit Dial, (TOUJEO MAX SOLOSTAR) 300 UNIT/ML SOPN Inject 90 Units into the skin at bedtime. 06/20/19  [name], MD  omega-3 acid ethyl esters (LOVAZA) 1 g capsule Take 3 capsules (3 g total) by mouth 2 (two) times daily. 06/20/19   Gouru, Illene Silver, MD  oxyCODONE (ROXICODONE) 5 MG immediate release tablet Take 1 tablet (5 mg total) by mouth every 6 (six) hours as needed for moderate pain, severe pain or breakthrough pain. 06/20/19   Gouru, Illene Silver, MD  Pancrelipase, Lip-Prot-Amyl, (ZENPEP) 40000-126000 units CPEP Take 1 capsule by mouth 5 (five) times daily. Before meals and  snacks Patient taking differently: Take 1-5 capsules by mouth as directed. 5 Capsule Before meals and 2 capsules with  snacks 06/02/17   Crecencio Mc, MD  pregabalin (LYRICA) 300 MG capsule Take 300 mg by mouth at bedtime.     [provider]  rosuvastatin (CRESTOR) 10 MG tablet Take 1 tablet (10 mg total) by mouth daily at 6 PM. 06/20/19   Gouru, Aruna, MD  tamsulosin (FLOMAX) 0.4 MG CAPS capsule Take 1 capsule (0.4 mg total) by mouth daily. 06/21/19   [name], MD  traZODone (DESYREL) 100 MG tablet Take 2 tablets (200 mg total) by mouth daily as needed for sleep. Patient taking differently: Take 100 mg by mouth at bedtime as needed for sleep.  07/11/18   Crecencio Mc, MD  TRULICITY 1.5 0000000 SOPN Inject 1.5 mg into the skin every Monday.  05/17/18   [provider]    Allergies Codeine and Iodinated diagnostic agents    Social History Social History   Tobacco Use  . Smoking status: Never Smoker  . Smokeless tobacco: Never Used  Substance Use Topics  . Alcohol use: No  . Drug use: No    Review of Systems Patient denies headaches, rhinorrhea, blurry vision, numbness, shortness of breath, chest pain, edema, cough, abdominal pain, nausea, vomiting, diarrhea, dysuria, fevers, rashes or hallucinations unless otherwise stated above in HPI. ____________________________________________   PHYSICAL EXAM:  VITAL SIGNS: Vitals:   11/26/19 1443 11/26/19 1706  BP: 130/85 (!) 142/94  Pulse: 91 92  Resp: 20 (!) 25  Temp: 98.7 F (37.1 C)   SpO2: 97% 96%    Constitutional: Alert and oriented.  Eyes: Conjunctivae are normal.  Head: Atraumatic. Nose: No congestion/rhinnorhea. Mouth/Throat: Mucous membranes are moist.   Neck: No stridor. Painless ROM.  Cardiovascular: Normal rate, regular rhythm. Grossly normal heart sounds.  Good peripheral circulation. Respiratory: Normal respiratory effort.  No retractions. Lungs CTAB. Gastrointestinal: Soft with mild epigastric ttp. No distention. No abdominal bruits. No CVA tenderness. Genitourinary:  Musculoskeletal: No lower extremity  tenderness nor edema.  No joint effusions. Neurologic:  Normal speech and language. No gross focal neurologic deficits are appreciated. No facial droop Skin:  Skin is warm, dry and intact. No rash noted. Psychiatric: Mood and affect are normal. Speech and behavior are normal.  ____________________________________________   LABS (all labs ordered are listed, but only abnormal results are displayed)  Results for orders placed or performed during the hospital encounter of 11/26/19 (from the past 24 hour(s))  Lipase, blood     Status: Abnormal   Collection Time: 11/26/19  2:43 PM  Result Value Ref Range   Lipase 478 (H) 11 - 51 U/L  Comprehensive metabolic panel     Status: Abnormal   Collection Time: 11/26/19  2:43 PM  Result Value Ref Range   Sodium 124 (L) 135 - 145 mmol/L   Potassium 3.7 3.5 - 5.1 mmol/L   Chloride 91 (L) 98 - 111 mmol/L   CO2 23  22 - 32 mmol/L   Glucose, Bld 236 (H) 70 - 99 mg/dL   BUN 9 6 - 20 mg/dL   Creatinine, Ser 0.41 (L) 0.61 - 1.24 mg/dL   Calcium 8.3 (L) 8.9 - 10.3 mg/dL   Total Protein 5.9 (L) 6.5 - 8.1 g/dL   Albumin 4.8 3.5 - 5.0 g/dL   AST 34 15 - 41 U/L   ALT 41 0 - 44 U/L   Alkaline Phosphatase 50 38 - 126 U/L   Total Bilirubin 2.6 (H) 0.3 - 1.2 mg/dL   GFR calc non Af Amer >60 >60 mL/min   GFR calc Af Amer >60 >60 mL/min   Anion gap 10 5 - 15  CBC     Status: Abnormal   Collection Time: 11/26/19  2:43 PM  Result Value Ref Range   WBC 5.7 4.0 - 10.5 K/uL   RBC 5.67 4.22 - 5.81 MIL/uL   Hemoglobin 15.9 13.0 - 17.0 g/dL   HCT 45.7 39.0 - 52.0 %   MCV 80.6 80.0 - 100.0 fL   MCH 28.0 26.0 - 34.0 pg   MCHC 34.8 30.0 - 36.0 g/dL   RDW 14.3 11.5 - 15.5 %   Platelets 223 150 - 400 K/uL   nRBC 0.7 (H) 0.0 - 0.2 %  Triglycerides     Status: Abnormal   Collection Time: 11/26/19  2:44 PM  Result Value Ref Range   Triglycerides >5,000 (H) <150 mg/dL   ____________________________________________  EKG My review and personal interpretation at  Time: 14:49   Indication: abd pain  Rate: 90  Rhythm: sinus Axis: nm Other: no stemi, normal intervals, ____________________________________________  RADIOLOGY  I personally reviewed all radiographic images ordered to evaluate for the above acute complaints and reviewed radiology reports and findings.  These findings were personally discussed with the patient.  Please see medical record for radiology report.  ____________________________________________   PROCEDURES  Procedure(s) performed:  .Critical Care Performed by: Merlyn Lot, MD Authorized by: Merlyn Lot, MD   Critical care provider statement:    Critical care time (minutes):  40   Critical care was necessary to treat or prevent imminent or life-threatening deterioration of the following conditions:  Endocrine crisis   Critical care was time spent personally by me on the following activities:  Discussions with consultants, evaluation of patient's response to treatment, examination of patient, ordering and performing treatments and interventions, ordering and review of laboratory studies, ordering and review of radiographic studies, pulse oximetry, re-evaluation of patient's condition, obtaining history from patient or surrogate and review of old charts      Critical Care performed: yes ____________________________________________   INITIAL IMPRESSION / ASSESSMENT AND PLAN / ED COURSE  Pertinent labs & imaging results that were available during my care of the patient were reviewed by me and considered in my medical decision making (see chart for details).   DDX: pancreatitis, hypertriglyceridemia, enteirits, gastritis, dehydration  Dechlan Horstmann [place] is a 44 y.o. who presents to the ED with history of hyper glyceride hemic pancreatitis presents the ER for several days of progressively worsening epigastric pain radiating through to his back associated with nausea vomiting and not tolerating p.o.  Blood work does  show evidence of light hemic abnormalities with elevated lipase and LFTs.  Ultrasound ordered to evaluate for obstruction which is stable.  His triglycerides are critically elevated greater than 5000.  Given his symptoms was given IV fluids was IV pain medication.  Will be started on insulin drip  and D5.  Will require hospitalization.  Clinical Course as of Nov 25 1912  Mon Nov 26, 2019  1707 nRBC(!): 0.7 [PR]    Clinical Course User Index [PR] Merlyn Lot, MD    The patient was evaluated in Emergency Department today for the symptoms described in the history of present illness. He/she was evaluated in the context of the global COVID-19 pandemic, which necessitated consideration that the patient might be at risk for infection with the SARS-CoV-2 virus that causes COVID-19. Institutional protocols and algorithms that pertain to the evaluation of patients at risk for COVID-19 are in a state of rapid change based on information released by regulatory bodies including the CDC and federal and state organizations. These policies and algorithms were followed during the patient's care in the ED.  As part of my medical decision making, I reviewed the following data within the [facility] notes reviewed and incorporated, Labs reviewed, notes from prior ED visits and [place] Controlled Substance Database   ____________________________________________   FINAL CLINICAL IMPRESSION(S) / ED DIAGNOSES  Final diagnoses:  Epigastric pain  Other acute pancreatitis, unspecified complication status  Hyperglycemia      NEW MEDICATIONS STARTED DURING THIS VISIT:  New Prescriptions   No medications on file     Note:  This document was prepared using Dragon voice recognition software and may include unintentional dictation errors.    Merlyn Lot, MD 11/26/19 [phone]

## 2019-11-26 NOTE — H&P (Signed)
[facility] [place] NAME: Sergio [name]    MR#:  [id number]  DATE OF BIRTH:  03-27-1976  DATE OF ADMISSION:  11/26/2019  PRIMARY CARE PHYSICIAN: System, Pcp Not In   REQUESTING/REFERRING PHYSICIAN: Merlyn Lot, MD CHIEF COMPLAINT:   Chief Complaint  Patient presents with  . Abdominal Pain    HISTORY OF PRESENT ILLNESS:  Sergio [name]  is a 44 y.o. Caucasian male with a known history of familial hypertriglyceridemia with subsequent recurrent acute pancreatitis, type 2 diabetes mellitus, sleep apnea, depression and anxiety, who presented to the emergency room with acute onset of epigastric abdominal pain with extension to both left upper and right upper quadrants with radiation through his back as well as nausea and vomiting that worsened today with no bilious vomitus or hematemesis or diarrhea.  He denied any chest pain or dyspnea or cough or wheezing.  No COVID-19 exposure.  No dysuria, oliguria or hematuria or flank pain.  He has been getting intermittent IV insulin infusion for his hypertriglyceridemia every couple weeks by his endocrinologist Dr. Gabriel Carina at the [facility].   Upon presentation to the emergency room, vital signs were within normal.  Labs revealed triglycerides more than 5000, hyponatremia 124 and hypochloremia of 91, blood glucose of 236 and serum lipase level was 478 with total protein of 5.9 albumin 2.6.  CBC was unremarkable.  2-lead EKG showed normal sinus rhythm with rate 93.  Right upper quadrant ultrasound revealed stable fatty infiltration of the liver and previous cholecystectomy.  The patient was given 1 mg of IV Dilaudid and 4 mg of IV Zofran as well as 12.5 mg of IV Phenergan, 1 L bolus of IV normal saline and was then started on IV insulin drip at 0.2 units/kg/h in addition to D5W at 125 mL/h.  He will be admitted to a stepdown unit bed for further evaluation and management. PAST MEDICAL HISTORY:   Past Medical History:    Diagnosis Date  . Anxiety   . Chicken pox   . Depression   . Diabetes mellitus    DM2  . Familial hypertriglyceridemia    severe  . Gastric ulcer 2009  . HTN (hypertension)   . Morbid obesity ([place])    s/p bariatric sleeve surgery 01/2015  . Recurrent acute pancreatitis    secondary to hypertriglyceridemia   . Sleep apnea 1999   uses CPAP    PAST SURGICAL HISTORY:   Past Surgical History:  Procedure Laterality Date  . CHOLECYSTECTOMY    . LAPAROSCOPIC GASTRIC SLEEVE RESECTION    . [place] SURGERY  2008  . TONSILLECTOMY  2003  . VASECTOMY      SOCIAL HISTORY:   Social History   Tobacco Use  . Smoking status: Never Smoker  . Smokeless tobacco: Never Used  Substance Use Topics  . Alcohol use: No    FAMILY HISTORY:   Family History  Adopted: Yes  Problem Relation Age of Onset  . Hypertension Mother   . Hyperlipidemia Mother   . Hypertension Father   . Hyperlipidemia Father     DRUG ALLERGIES:   Allergies  Allergen Reactions  . Codeine Anaphylaxis  . Iodinated Diagnostic Agents Other (See Comments) and Anaphylaxis    Kidneys stop working KIDNEYS SHUT DOWN    REVIEW OF SYSTEMS:   ROS As per history of present illness. All pertinent systems were reviewed above. Constitutional,  HEENT, cardiovascular, respiratory, GI, GU, musculoskeletal, neuro, psychiatric, endocrine,  integumentary and  hematologic systems were reviewed and are otherwise  negative/unremarkable except for positive findings mentioned above in the HPI.   MEDICATIONS AT HOME:   Prior to Admission medications   Medication Sig Start Date End Date Taking? Authorizing Provider  acetaminophen (TYLENOL) 325 MG tablet Take 2 tablets (650 mg total) by mouth every 6 (six) hours as needed for mild pain (or Fever >/= 101). 06/20/19   Gouru, Aruna, MD  ALPRAZolam (XANAX) 0.5 MG tablet TAKE 1 TABLET BY MOUTH TWICE DAILY AS NEEDED FOR ANXIETY Patient taking differently: Take 0.5 mg by mouth 2  (two) times daily as needed for anxiety.  09/20/18   Crecencio Mc, MD  docusate sodium (COLACE) 100 MG capsule Take 1 capsule (100 mg total) by mouth 2 (two) times daily. 06/20/19   [name], MD  gemfibrozil (LOPID) 600 MG tablet Take 1 tablet (600 mg total) by mouth 2 (two) times daily before a meal. 06/20/19   Gouru, Aruna, MD  insulin aspart (NOVOLOG) 100 UNIT/ML injection Inject 1 Dose into the skin See admin instructions. Per sliding scale 09/11/15   [provider]  Insulin Glargine, 2 Unit Dial, (TOUJEO MAX SOLOSTAR) 300 UNIT/ML SOPN Inject 90 Units into the skin at bedtime. 06/20/19   [name], MD  omega-3 acid ethyl esters (LOVAZA) 1 g capsule Take 3 capsules (3 g total) by mouth 2 (two) times daily. 06/20/19   Gouru, Illene Silver, MD  oxyCODONE (ROXICODONE) 5 MG immediate release tablet Take 1 tablet (5 mg total) by mouth every 6 (six) hours as needed for moderate pain, severe pain or breakthrough pain. 06/20/19   Gouru, Illene Silver, MD  Pancrelipase, Lip-Prot-Amyl, (ZENPEP) 40000-126000 units CPEP Take 1 capsule by mouth 5 (five) times daily. Before meals and  snacks Patient taking differently: Take 1-5 capsules by mouth as directed. 5 Capsule Before meals and 2 capsules with snacks 06/02/17   Crecencio Mc, MD  pregabalin (LYRICA) 300 MG capsule Take 300 mg by mouth at bedtime.     [provider]  rosuvastatin (CRESTOR) 10 MG tablet Take 1 tablet (10 mg total) by mouth daily at 6 PM. 06/20/19   Gouru, Aruna, MD  tamsulosin (FLOMAX) 0.4 MG CAPS capsule Take 1 capsule (0.4 mg total) by mouth daily. 06/21/19   [name], MD  traZODone (DESYREL) 100 MG tablet Take 2 tablets (200 mg total) by mouth daily as needed for sleep. Patient taking differently: Take 100 mg by mouth at bedtime as needed for sleep.  07/11/18   Crecencio Mc, MD  TRULICITY 1.5 0000000 SOPN Inject 1.5 mg into the skin every Monday.  05/17/18   [provider]      VITAL SIGNS:  Blood pressure (!) 142/94,  pulse 92, temperature 98.7 F (37.1 C), temperature source Oral, resp. rate (!) 25, height 6' (1.829 m), weight 112 kg, SpO2 96 %.  PHYSICAL EXAMINATION:  Physical Exam  GENERAL:  44 y.o.-year-old Caucasian male patient lying in the bed with no acute distress.  EYES: Pupils equal, round, reactive to light and accommodation. No scleral icterus. Extraocular muscles intact.  HEENT: Head atraumatic, normocephalic. Oropharynx and nasopharynx clear.  NECK:  Supple, no jugular venous distention. No thyroid enlargement, no tenderness.  LUNGS: Normal breath sounds bilaterally, no wheezing, rales,rhonchi or crepitation. No use of accessory muscles of respiration.  CARDIOVASCULAR: Regular rate and rhythm, S1, S2 normal. No murmurs, rubs, or gallops.  ABDOMEN: Soft, nondistended with epigastric left upper quadrant and right upper quadrant tenderness without rebound  tenderness guarding or rigidity.  Bowel sounds present. No organomegaly or mass.  EXTREMITIES: No pedal edema, cyanosis, or clubbing.  NEUROLOGIC: Cranial nerves II through XII are intact. Muscle strength 5/5 in all extremities. Sensation intact. Gait not checked.  PSYCHIATRIC: The patient is alert and oriented x 3.  Normal affect and good eye contact. SKIN: No obvious rash, lesion, or ulcer.   LABORATORY PANEL:   CBC Recent Labs  Lab 11/26/19 1443  WBC 5.7  HGB 15.9  HCT 45.7  PLT 223   ------------------------------------------------------------------------------------------------------------------  Chemistries  Recent Labs  Lab 11/26/19 1443  NA 124*  K 3.7  CL 91*  CO2 23  GLUCOSE 236*  BUN 9  CREATININE 0.41*  CALCIUM 8.3*  AST 34  ALT 41  ALKPHOS 50  BILITOT 2.6*   ------------------------------------------------------------------------------------------------------------------  Cardiac Enzymes No results for input(s): TROPONINI in the last 168  hours. ------------------------------------------------------------------------------------------------------------------  RADIOLOGY:  US ABDOMEN LIMITED RUQ  Result Date: 11/26/2019 CLINICAL DATA:  Epigastric pain for 3 days EXAM: ULTRASOUND ABDOMEN LIMITED RIGHT UPPER QUADRANT COMPARISON:  01/15/2018, 06/15/2019 FINDINGS: Gallbladder: Cholecystectomy Common bile duct: Diameter: 4 mm Liver: Diffuse increased liver echotexture again noted consistent with fibrofatty infiltration. No focal abnormalities or intrahepatic biliary duct dilation. Portal vein is patent on color Doppler imaging with normal direction of blood flow towards the liver. Other: None. IMPRESSION: 1. Stable fatty infiltration of the liver. 2. Cholecystectomy. Electronically Signed   By: Randa Ngo M.D.   On: 11/26/2019 18:51      IMPRESSION AND PLAN:   1.  Severe hyper triglyceridemia induced recurrent acute pancreatitis. -The patient will be admitted to a stepdown unit bed. -He will be continued on IV insulin drip per Endo tool and IV D5W infusion. -We will continue his Lovaza, statin therapy with Crestor and gemfibrozil as well as Zenpep. -We will follow daily triglycerides and lipase levels. -He has improved to such management in the past. -She does levels not improve with such therapy above, plasmapheresis would be the next step.  2.  Type 2 diabetes mellitus glycemia. -We will hold off long-acting insulin and Metformin while he is on IV insulin drip and continue close monitoring of his blood glucose levels.  3.  Peripheral neuropathy. -We will continue his Lyrica.  4.  BPH. -We will continue his Flomax.  5.  Anxiety and depression. -We will continue Xanax and trazodone.  6.  DVT prophylaxis. -Subcutaneous Lovenox.   All the records are reviewed and case discussed with ED provider. The plan of care was discussed in details with the patient (and family). I answered all questions. The patient agreed to  proceed with the above mentioned plan. Further management will depend upon hospital course.   CODE STATUS: Full code  TOTAL TIME TAKING CARE OF THIS PATIENT: 55 minutes.    Christel Mormon M.D on 11/26/2019 at 8:08 PM  [facility]   From 7 PM-7 AM, contact night-coverage [email or web address]  CC: Primary care physician; System, Pcp Not In   Note: This dictation was prepared with Dragon dictation along with smaller phrase technology. Any transcriptional errors that result from this process are unintentional.

## 2019-11-26 NOTE — ED Notes (Signed)
Pt received insulin infusions in clinic and is due this Friday. Called today but they were unable to get him in.

## 2019-11-27 DIAGNOSIS — E118 Type 2 diabetes mellitus with unspecified complications: Secondary | ICD-10-CM

## 2019-11-27 DIAGNOSIS — K858 Other acute pancreatitis without necrosis or infection: Principal | ICD-10-CM

## 2019-11-27 LAB — COMPREHENSIVE METABOLIC PANEL
ALT: 33 U/L (ref 0–44)
AST: 28 U/L (ref 15–41)
Albumin: 4 g/dL (ref 3.5–5.0)
Alkaline Phosphatase: 38 U/L (ref 38–126)
Anion gap: 5 (ref 5–15)
BUN: 9 mg/dL (ref 6–20)
CO2: 30 mmol/L (ref 22–32)
Calcium: 8.3 mg/dL — ABNORMAL LOW (ref 8.9–10.3)
Chloride: 97 mmol/L — ABNORMAL LOW (ref 98–111)
Creatinine, Ser: 0.72 mg/dL (ref 0.61–1.24)
GFR calc Af Amer: >60 mL/min (ref >60)
GFR calc non Af Amer: >60 mL/min (ref >60)
Glucose, Bld: 69 mg/dL — ABNORMAL LOW (ref 70–99)
Potassium: 3.5 mmol/L (ref 3.5–5.1)
Sodium: 132 mmol/L — ABNORMAL LOW (ref 135–145)
Total Bilirubin: 0.4 mg/dL (ref 0.3–1.2)
Total Protein: 5.7 g/dL — ABNORMAL LOW (ref 6.5–8.1)

## 2019-11-27 LAB — GLUCOSE, CAPILLARY
Glucose-Capillary: 100 mg/dL — ABNORMAL HIGH (ref 70–99)
Glucose-Capillary: 106 mg/dL — ABNORMAL HIGH (ref 70–99)
Glucose-Capillary: 107 mg/dL — ABNORMAL HIGH (ref 70–99)
Glucose-Capillary: 158 mg/dL — ABNORMAL HIGH (ref 70–99)
Glucose-Capillary: 56 mg/dL — ABNORMAL LOW (ref 70–99)
Glucose-Capillary: 58 mg/dL — ABNORMAL LOW (ref 70–99)
Glucose-Capillary: 63 mg/dL — ABNORMAL LOW (ref 70–99)
Glucose-Capillary: 65 mg/dL — ABNORMAL LOW (ref 70–99)
Glucose-Capillary: 67 mg/dL — ABNORMAL LOW (ref 70–99)
Glucose-Capillary: 69 mg/dL — ABNORMAL LOW (ref 70–99)
Glucose-Capillary: 70 mg/dL (ref 70–99)
Glucose-Capillary: 71 mg/dL (ref 70–99)
Glucose-Capillary: 71 mg/dL (ref 70–99)
Glucose-Capillary: 73 mg/dL (ref 70–99)
Glucose-Capillary: 74 mg/dL (ref 70–99)
Glucose-Capillary: 75 mg/dL (ref 70–99)
Glucose-Capillary: 75 mg/dL (ref 70–99)
Glucose-Capillary: 76 mg/dL (ref 70–99)
Glucose-Capillary: 78 mg/dL (ref 70–99)
Glucose-Capillary: 79 mg/dL (ref 70–99)
Glucose-Capillary: 80 mg/dL (ref 70–99)
Glucose-Capillary: 83 mg/dL (ref 70–99)
Glucose-Capillary: 91 mg/dL (ref 70–99)
Glucose-Capillary: 91 mg/dL (ref 70–99)

## 2019-11-27 LAB — URINALYSIS, COMPLETE (UACMP) WITH MICROSCOPIC
Bacteria, UA: NONE SEEN
Bilirubin Urine: NEGATIVE
Glucose, UA: 150 mg/dL — AB
Hgb urine dipstick: NEGATIVE
Ketones, ur: 20 mg/dL — AB
Leukocytes,Ua: NEGATIVE
Nitrite: NEGATIVE
Protein, ur: 30 mg/dL — AB
Specific Gravity, Urine: 1.024 (ref 1.005–1.030)
Squamous Epithelial / HPF: NONE SEEN (ref 0–5)
pH: 5 (ref 5.0–8.0)

## 2019-11-27 LAB — BASIC METABOLIC PANEL
Anion gap: 5 (ref 5–15)
Anion gap: 7 (ref 5–15)
BUN: 6 mg/dL (ref 6–20)
BUN: <5 mg/dL — ABNORMAL LOW (ref 6–20)
CO2: 28 mmol/L (ref 22–32)
CO2: 25 mmol/L (ref 22–32)
Calcium: 8 mg/dL — ABNORMAL LOW (ref 8.9–10.3)
Calcium: 7.9 mg/dL — ABNORMAL LOW (ref 8.9–10.3)
Chloride: 99 mmol/L (ref 98–111)
Chloride: 100 mmol/L (ref 98–111)
Creatinine, Ser: 0.98 mg/dL (ref 0.61–1.24)
Creatinine, Ser: 0.89 mg/dL (ref 0.61–1.24)
GFR calc Af Amer: >60 mL/min (ref >60)
GFR calc Af Amer: >60 mL/min (ref >60)
GFR calc non Af Amer: >60 mL/min (ref >60)
GFR calc non Af Amer: >60 mL/min (ref >60)
Glucose, Bld: 85 mg/dL (ref 70–99)
Glucose, Bld: 120 mg/dL — ABNORMAL HIGH (ref 70–99)
Potassium: 4.7 mmol/L (ref 3.5–5.1)
Potassium: 3.7 mmol/L (ref 3.5–5.1)
Sodium: 132 mmol/L — ABNORMAL LOW (ref 135–145)
Sodium: 132 mmol/L — ABNORMAL LOW (ref 135–145)

## 2019-11-27 LAB — CBC
HCT: 40.9 % (ref 39.0–52.0)
Hemoglobin: 14 g/dL (ref 13.0–17.0)
MCH: 27.7 pg (ref 26.0–34.0)
MCHC: 34.2 g/dL (ref 30.0–36.0)
MCV: 80.8 fL (ref 80.0–100.0)
Platelets: 202 10*3/uL (ref 150–400)
RBC: 5.06 MIL/uL (ref 4.22–5.81)
RDW: 14.3 % (ref 11.5–15.5)
WBC: 7.5 10*3/uL (ref 4.0–10.5)
nRBC: 0 % (ref 0.0–0.2)

## 2019-11-27 LAB — TRIGLYCERIDES: Triglycerides: >5000 mg/dL — ABNORMAL HIGH (ref <150)

## 2019-11-27 LAB — MRSA PCR SCREENING: MRSA by PCR: NEGATIVE

## 2019-11-27 LAB — HIV ANTIBODY (ROUTINE TESTING W REFLEX): HIV Screen 4th Generation wRfx: NONREACTIVE

## 2019-11-27 LAB — LIPASE, BLOOD: Lipase: 56 U/L — ABNORMAL HIGH (ref 11–51)

## 2019-11-27 MED ORDER — DEXTROSE-NACL 5-0.9 % IV SOLN: INTRAVENOUS | Status: DC

## 2019-11-27 MED ORDER — SODIUM CHLORIDE 4 MEQ/ML IV SOLN
INTRAVENOUS | Status: DC
  Filled 2019-11-27 (×20): qty 1000

## 2019-11-27 MED ORDER — STERILE WATER FOR INJECTION IJ SOLN
INTRAMUSCULAR | Status: AC
  Administered 2019-11-27: 10 mL
  Filled 2019-11-27: qty 10

## 2019-11-27 MED ORDER — HYDROMORPHONE HCL 1 MG/ML IJ SOLN
1.0000 mg | INTRAMUSCULAR | Status: DC | PRN
  Administered 2019-11-27 – 2019-12-03 (×47): 1 mg via INTRAVENOUS
  Filled 2019-11-27 (×47): qty 1

## 2019-11-27 MED ORDER — PANCRELIPASE (LIP-PROT-AMYL) 12000-38000 UNITS PO CPEP
72000.0000 [IU] | ORAL_CAPSULE | ORAL | Status: DC
  Administered 2019-11-27 – 2019-12-03 (×8): 72000 [IU] via ORAL
  Filled 2019-11-27: qty 6

## 2019-11-27 MED ORDER — POTASSIUM CHLORIDE 10 MEQ/100ML IV SOLN
10.0000 meq | INTRAVENOUS | Status: AC
  Administered 2019-11-27 (×3): 10 meq via INTRAVENOUS
  Filled 2019-11-27 (×3): qty 100

## 2019-11-27 MED ORDER — DEXTROSE 50 % IV SOLN
INTRAVENOUS | Status: AC
  Administered 2019-11-27: 12.5 g via INTRAVENOUS
  Filled 2019-11-27: qty 50

## 2019-11-27 MED ORDER — DEXTROSE 50 % IV SOLN
12.5000 g | Freq: Once | INTRAVENOUS | Status: AC
  Administered 2019-11-27: 05:00:00 12.5 g via INTRAVENOUS

## 2019-11-27 MED ORDER — CHLORHEXIDINE GLUCONATE CLOTH 2 % EX PADS
6.0000 | MEDICATED_PAD | Freq: Every day | CUTANEOUS | Status: DC
  Administered 2019-11-28 – 2019-12-02 (×5): 6 via TOPICAL

## 2019-11-27 MED ORDER — POTASSIUM CHLORIDE CRYS ER 20 MEQ PO TBCR
20.0000 meq | EXTENDED_RELEASE_TABLET | Freq: Once | ORAL | Status: AC
  Administered 2019-11-27: 20 meq via ORAL
  Filled 2019-11-27: qty 1

## 2019-11-27 MED ORDER — PANCRELIPASE (LIP-PROT-AMYL) 36000-114000 UNITS PO CPEP
180000.0000 [IU] | ORAL_CAPSULE | Freq: Three times a day (TID) | ORAL | Status: DC
  Administered 2019-11-29 – 2019-12-03 (×7): 180000 [IU] via ORAL
  Filled 2019-11-27 (×20): qty 5

## 2019-11-27 NOTE — Progress Notes (Signed)
[facility] for Electrolyte Monitoring and Replacement   Recent Labs: Potassium (mmol/L)  Date Value  11/27/2019 3.5  11/04/2014 3.6   Magnesium (mg/dL)  Date Value  06/19/2019 1.9  03/18/2014 1.9   Calcium (mg/dL)  Date Value  11/27/2019 8.3 (L)   Calcium, Total (mg/dL)  Date Value  11/04/2014 8.5   Albumin (g/dL)  Date Value  11/27/2019 4.0  11/01/2014 3.0 (L)   Phosphorus (mg/dL)  Date Value  01/21/2018 5.1 (H)   Sodium  Date Value  11/27/2019 132 mmol/L (L)  04/12/2018 129 (A)  11/04/2014 137 mmol/L     Assessment: 44 year old male admitted for hypertriglyceridemia. Patient started on insulin drip at 0.2 units/kg/hr. Pharmacy consulted to manage electrolytes.  Goal of Therapy:  Electrolytes WNL, potassium > 4 while on insulin drip  Plan:  Patient to receive potassium 10 mEq IV x 3 for now as previous potassium 3.5 and likely to have decreased. Will check BMP q4h, next draw approximately 1800. Will need to follow closely while on insulin drip. Fluids changed to D10NS to accommodate insulin drip.  Follow up electrolytes at 1800.  Tawnya Crook ,PharmD Clinical Pharmacist 11/27/2019 3:17 PM

## 2019-11-27 NOTE — Progress Notes (Signed)
PROGRESS NOTE    Sergio [name]  [id number] DOB: 07-17-1976 DOA: 11/26/2019 PCP: System, Pcp Not In   Brief Narrative:  Sergio [name]  is a 44 y.o. Caucasian male with a known history of familial hypertriglyceridemia with subsequent recurrent acute pancreatitis, type 2 diabetes mellitus, sleep apnea, depression and anxiety, who presented to the emergency room with acute onset of epigastric abdominal pain with extension to both left upper and right upper quadrants with radiation through his back as well as nausea and vomiting that worsened today with no bilious vomitus or hematemesis or diarrhea.  He denied any chest pain or dyspnea or cough or wheezing.  No COVID-19 exposure.  No dysuria, oliguria or hematuria or flank pain.  He has been getting intermittent IV insulin infusion for his hypertriglyceridemia every couple weeks by his endocrinologist Dr. Gabriel Carina at the [facility].   Upon presentation to the emergency room, vital signs were within normal.  Labs revealed triglycerides more than 5000, hyponatremia 124 and hypochloremia of 91, blood glucose of 236 and serum lipase level was 478 with total protein of 5.9 albumin 2.6.  CBC was unremarkable.  2-lead EKG showed normal sinus rhythm with rate 93.  Right upper quadrant ultrasound revealed stable fatty infiltration of the liver and previous cholecystectomy.  The patient was given 1 mg of IV Dilaudid and 4 mg of IV Zofran as well as 12.5 mg of IV Phenergan, 1 L bolus of IV normal saline and was then started on IV insulin drip at 0.2 units/kg/h in addition to D5W at 125 mL/h.  He will be admitted to a stepdown unit bed for further evaluation and management.  2/16: Patient seen and examined on stepdown unit.  Remains on insulin GTT.  Triglycerides still greater than 5000.  Lipase trending down.  Abdominal pain improved.   Assessment & Plan:   Active Problems:   Acute pancreatitis   Severe hyper triglyceridemia induced recurrent acute  pancreatitis. Patient is unaware of his family history Has had recurrent episodes of pancreatitis secondary to triglyceridemia Usually gets insulin infusions at his endocrinologist office Has never had plasmapheresis before Appears to be clinically responding with downtrending lipase and improving abdominal pain Lab cannot quantify beyond triglyceride level 5000 Plan: Continue stepdown status Continue insulin GTT per Endo tool protocol Switch maintenance fluids to D10 at 150 cc/h to accommodate for insulin drip Daily triglycerides, lipase Maintain n.p.o. for now, small sips okay As needed pain control Continue Lovaza Continue statin Continue gemfibrozil Continue Zenpep If patient is not responding clinically he may require transfer to [facility] for plasmapheresis.  At this time we will attempt to control hypertriglyceridemia with IV insulin.  Type 2 diabetes mellitus glycemia. IV insulin drip as above  Peripheral neuropathy. Continue home Lyrica  BPH. Continue home Flomax  Anxiety and depression. Continue as needed Xanax Continue nightly trazodone   DVT prophylaxis: Lovenox Code Status: Full Family Communication: None today Disposition Plan: Home pending resolution of hypertriglyceridemia and acute pancreatitis   Consultants:   None  Procedures:   None  Antimicrobials:  None   Subjective: Seen and examined Hemodynamically stable On insulin GTT Still with some belly pain though improved  Objective: Vitals:   11/27/19 1000 11/27/19 1100 11/27/19 1200 11/27/19 1300  BP: 114/77 125/85 109/74 112/83  Pulse: 71 89 77 71  Resp: 13 17 10 13   Temp:   97.8 F (36.6 C)   TempSrc:   Oral   SpO2: 93% 95% (!) 89% 97%  Weight:  Height:        Intake/Output Summary (Last 24 hours) at 11/27/2019 1429 Last data filed at 11/27/2019 1344 Gross per 24 hour  Intake 3382.45 ml  Output 1000 ml  Net 2382.45 ml   Filed Weights   11/26/19 1441  Weight: 112  kg    Examination:  General exam: Appears calm and comfortable  Respiratory system: Clear to auscultation. Respiratory effort normal. Cardiovascular system: S1 & S2 heard, RRR. No JVD, murmurs, rubs, gallops or clicks. No pedal edema. Gastrointestinal system: Nondistended, tender to palpation, positive bowel sounds Central nervous system: Alert and oriented. No focal neurological deficits. Extremities: Symmetric 5 x 5 power. Skin: No rashes, lesions or ulcers Psychiatry: Judgement and insight appear normal. Mood & affect appropriate.     Data Reviewed: I have personally reviewed following labs and imaging studies  CBC: Recent Labs  Lab 11/26/19 1443 11/27/19 0407  WBC 5.7 7.5  HGB 15.9 14.0  HCT 45.7 40.9  MCV 80.6 80.8  PLT 223 [id number]   Basic Metabolic Panel: Recent Labs  Lab 11/26/19 1443 11/27/19 0407  NA 124* 132*  K 3.7 3.5  CL 91* 97*  CO2 23 30  GLUCOSE 236* 69*  BUN 9 9  CREATININE 0.41* 0.72  CALCIUM 8.3* 8.3*   GFR: Estimated Creatinine Clearance: 155.5 mL/min (by C-G formula based on SCr of 0.72 mg/dL). Liver Function Tests: Recent Labs  Lab 11/26/19 1443 11/27/19 0407  AST 34 28  ALT 41 33  ALKPHOS 50 38  BILITOT 2.6* 0.4  PROT 5.9* 5.7*  ALBUMIN 4.8 4.0   Recent Labs  Lab 11/26/19 1443 11/27/19 0407  LIPASE 478* 56*   No results for input(s): AMMONIA in the last 168 hours. Coagulation Profile: No results for input(s): INR, PROTIME in the last 168 hours. Cardiac Enzymes: No results for input(s): CKTOTAL, CKMB, CKMBINDEX, TROPONINI in the last 168 hours. BNP (last 3 results) No results for input(s): PROBNP in the last 8760 hours. HbA1C: No results for input(s): HGBA1C in the last 72 hours. CBG: Recent Labs  Lab 11/27/19 1110 11/27/19 1302 11/27/19 1303 11/27/19 1330 11/27/19 1351  GLUCAP 70 58* 67* 65* 69*   Lipid Profile: Recent Labs    11/26/19 1444 11/27/19 0407  TRIG >5,000* >5,000*   Thyroid Function Tests: No  results for input(s): TSH, T4TOTAL, FREET4, T3FREE, THYROIDAB in the last 72 hours. Anemia Panel: No results for input(s): VITAMINB12, FOLATE, FERRITIN, TIBC, IRON, RETICCTPCT in the last 72 hours. Sepsis Labs: No results for input(s): PROCALCITON, LATICACIDVEN in the last 168 hours.  Recent Results (from the past 240 hour(s))  MRSA PCR Screening     Status: None   Collection Time: 11/26/19 11:34 PM   Specimen: Nasopharyngeal  Result Value Ref Range Status   MRSA by PCR NEGATIVE NEGATIVE Final    Comment:        The GeneXpert MRSA Assay (FDA approved for NASAL specimens only), is one component of a comprehensive MRSA colonization surveillance program. It is not intended to diagnose MRSA infection nor to guide or monitor treatment for MRSA infections. Performed at [facility], [address] [place], [place] [address]          Radiology Studies: US ABDOMEN LIMITED RUQ  Result Date: 11/26/2019 CLINICAL DATA:  Epigastric pain for 3 days EXAM: ULTRASOUND ABDOMEN LIMITED RIGHT UPPER QUADRANT COMPARISON:  01/15/2018, 06/15/2019 FINDINGS: Gallbladder: Cholecystectomy Common bile duct: Diameter: 4 mm Liver: Diffuse increased liver echotexture again noted consistent with fibrofatty infiltration.  No focal abnormalities or intrahepatic biliary duct dilation. Portal vein is patent on color Doppler imaging with normal direction of blood flow towards the liver. Other: None. IMPRESSION: 1. Stable fatty infiltration of the liver. 2. Cholecystectomy. Electronically Signed   By: Randa Ngo M.D.   On: 11/26/2019 18:51        Scheduled Meds: . Chlorhexidine Gluconate Cloth  6 each Topical Daily  . docusate sodium  100 mg Oral BID  . enoxaparin (LOVENOX) injection  40 mg Subcutaneous Q24H  . gemfibrozil  600 mg Oral BID AC  . lipase/protease/amylase  180,000 Units Oral TID AC  . lipase/protease/amylase  72,000 Units Oral With snacks  . omega-3 acid ethyl esters  3 g Oral BID   . pregabalin  300 mg Oral QHS  . rosuvastatin  10 mg Oral q1800  . sodium chloride flush  3 mL Intravenous Once  . tamsulosin  0.4 mg Oral Daily   Continuous Infusions: . dextrose 10 % 1,000 mL with sodium chloride 154 mEq infusion 150 mL/hr at 11/27/19 1343  . insulin 0.2 Units/kg/hr (11/27/19 1132)  . potassium chloride 10 mEq (11/27/19 1344)     LOS: 1 day    Time spent: 35 minutes    Sidney Ace, MD [facility] Pager [phone]  If 7PM-7AM, please contact night-coverage 11/27/2019, 2:29 PM

## 2019-11-27 NOTE — Progress Notes (Signed)
Pts CBG 56. Pt resting in bed alert and oriented with no complaints or symptoms. Pt states that PO juice made him nauseated earlier in the day. IV dextrose given per hypoglycemia side bar. On call NP made aware of occurrence. Will cont to monitor closely.

## 2019-11-27 NOTE — Progress Notes (Signed)
Pts CBG is at 95 he is alert and oriented asymptomatic. Reaching out to on call NP for further orders.

## 2019-11-27 NOTE — Progress Notes (Signed)
[place] for Electrolyte Monitoring and Replacement   Recent Labs: Potassium (mmol/L)  Date Value  11/27/2019 3.7  11/04/2014 3.6   Magnesium (mg/dL)  Date Value  06/19/2019 1.9  03/18/2014 1.9   Calcium (mg/dL)  Date Value  11/27/2019 7.9 (L)   Calcium, Total (mg/dL)  Date Value  11/04/2014 8.5   Albumin (g/dL)  Date Value  11/27/2019 4.0  11/01/2014 3.0 (L)   Phosphorus (mg/dL)  Date Value  01/21/2018 5.1 (H)   Sodium  Date Value  11/27/2019 132 mmol/L (L)  04/12/2018 129 (A)  11/04/2014 137 mmol/L   Assessment: 44 year old male admitted for hypertriglyceridemia. Patient started on insulin drip at 0.2 units/kg/hr. Pharmacy consulted to manage electrolytes.  Goal of Therapy:  Electrolytes WNL, potassium > 4 while on insulin drip  Plan:  Potassium level = 3.7 @ 2200. Will give KCL 66meq PO x 1 as goal is to keep K+ > 4. Will check BMP q4h, next draw approximately 0200. Will need to follow closely while on insulin drip. Fluids changed to D10NS to accommodate insulin drip.  Follow up electrolytes at 0200.  Ena Dawley ,PharmD Clinical Pharmacist 11/27/2019 10:44 PM

## 2019-11-27 NOTE — Progress Notes (Addendum)
[place] for Electrolyte Monitoring and Replacement   Recent Labs: Potassium (mmol/L)  Date Value  11/27/2019 4.7  11/04/2014 3.6   Magnesium (mg/dL)  Date Value  06/19/2019 1.9  03/18/2014 1.9   Calcium (mg/dL)  Date Value  11/27/2019 8.0 (L)   Calcium, Total (mg/dL)  Date Value  11/04/2014 8.5   Albumin (g/dL)  Date Value  11/27/2019 4.0  11/01/2014 3.0 (L)   Phosphorus (mg/dL)  Date Value  01/21/2018 5.1 (H)   Sodium  Date Value  11/27/2019 132 mmol/L (L)  04/12/2018 129 (A)  11/04/2014 137 mmol/L     Assessment: 44 year old male admitted for hypertriglyceridemia. Patient started on insulin drip at 0.2 units/kg/hr. Pharmacy consulted to manage electrolytes.  Goal of Therapy:  Electrolytes WNL, potassium > 4 while on insulin drip  Plan:  Patient was receiving potassium 10 mEq IV during lab draw and therefore now has potassium level of 4.7, which is most likely skewed. Will defer further supplementation currently. Will check BMP q4h, next draw approximately 2200. Will need to follow closely while on insulin drip. Fluids changed to D10NS to accommodate insulin drip.  Follow up electrolytes at 2200.  Pearla Dubonnet ,PharmD Clinical Pharmacist 11/27/2019 7:08 PM

## 2019-11-28 ENCOUNTER — Encounter: Payer: Self-pay | Admitting: Family Medicine

## 2019-11-28 LAB — BASIC METABOLIC PANEL
Anion gap: 11 (ref 5–15)
Anion gap: 14 (ref 5–15)
Anion gap: 12 (ref 5–15)
Anion gap: 13 (ref 5–15)
Anion gap: 7 (ref 5–15)
BUN: <5 mg/dL — ABNORMAL LOW (ref 6–20)
BUN: <5 mg/dL — ABNORMAL LOW (ref 6–20)
BUN: <5 mg/dL — ABNORMAL LOW (ref 6–20)
BUN: <5 mg/dL — ABNORMAL LOW (ref 6–20)
BUN: <5 mg/dL — ABNORMAL LOW (ref 6–20)
CO2: 24 mmol/L (ref 22–32)
CO2: 22 mmol/L (ref 22–32)
CO2: 22 mmol/L (ref 22–32)
CO2: 24 mmol/L (ref 22–32)
CO2: 26 mmol/L (ref 22–32)
Calcium: 7.9 mg/dL — ABNORMAL LOW (ref 8.9–10.3)
Calcium: 8.2 mg/dL — ABNORMAL LOW (ref 8.9–10.3)
Calcium: 8.3 mg/dL — ABNORMAL LOW (ref 8.9–10.3)
Calcium: 8.3 mg/dL — ABNORMAL LOW (ref 8.9–10.3)
Calcium: 8.3 mg/dL — ABNORMAL LOW (ref 8.9–10.3)
Chloride: 102 mmol/L (ref 98–111)
Chloride: 102 mmol/L (ref 98–111)
Chloride: 102 mmol/L (ref 98–111)
Chloride: 102 mmol/L (ref 98–111)
Chloride: 103 mmol/L (ref 98–111)
Creatinine, Ser: 0.86 mg/dL (ref 0.61–1.24)
Creatinine, Ser: 0.92 mg/dL (ref 0.61–1.24)
Creatinine, Ser: 0.91 mg/dL (ref 0.61–1.24)
Creatinine, Ser: 0.77 mg/dL (ref 0.61–1.24)
Creatinine, Ser: 0.82 mg/dL (ref 0.61–1.24)
GFR calc Af Amer: >60 mL/min (ref >60)
GFR calc Af Amer: >60 mL/min (ref >60)
GFR calc Af Amer: >60 mL/min (ref >60)
GFR calc Af Amer: >60 mL/min (ref >60)
GFR calc Af Amer: >60 mL/min (ref >60)
GFR calc non Af Amer: >60 mL/min (ref >60)
GFR calc non Af Amer: >60 mL/min (ref >60)
GFR calc non Af Amer: >60 mL/min (ref >60)
GFR calc non Af Amer: >60 mL/min (ref >60)
GFR calc non Af Amer: >60 mL/min (ref >60)
Glucose, Bld: 85 mg/dL (ref 70–99)
Glucose, Bld: 81 mg/dL (ref 70–99)
Glucose, Bld: 65 mg/dL — ABNORMAL LOW (ref 70–99)
Glucose, Bld: 72 mg/dL (ref 70–99)
Glucose, Bld: 128 mg/dL — ABNORMAL HIGH (ref 70–99)
Potassium: 3.6 mmol/L (ref 3.5–5.1)
Potassium: 4.2 mmol/L (ref 3.5–5.1)
Potassium: UNDETERMINED mmol/L (ref 3.5–5.1)
Potassium: 3.6 mmol/L (ref 3.5–5.1)
Potassium: 3.6 mmol/L (ref 3.5–5.1)
Sodium: 137 mmol/L (ref 135–145)
Sodium: 138 mmol/L (ref 135–145)
Sodium: 136 mmol/L (ref 135–145)
Sodium: 139 mmol/L (ref 135–145)
Sodium: 136 mmol/L (ref 135–145)

## 2019-11-28 LAB — GLUCOSE, CAPILLARY
Glucose-Capillary: 101 mg/dL — ABNORMAL HIGH (ref 70–99)
Glucose-Capillary: 104 mg/dL — ABNORMAL HIGH (ref 70–99)
Glucose-Capillary: 105 mg/dL — ABNORMAL HIGH (ref 70–99)
Glucose-Capillary: 112 mg/dL — ABNORMAL HIGH (ref 70–99)
Glucose-Capillary: 127 mg/dL — ABNORMAL HIGH (ref 70–99)
Glucose-Capillary: 131 mg/dL — ABNORMAL HIGH (ref 70–99)
Glucose-Capillary: 66 mg/dL — ABNORMAL LOW (ref 70–99)
Glucose-Capillary: 66 mg/dL — ABNORMAL LOW (ref 70–99)
Glucose-Capillary: 68 mg/dL — ABNORMAL LOW (ref 70–99)
Glucose-Capillary: 74 mg/dL (ref 70–99)
Glucose-Capillary: 76 mg/dL (ref 70–99)
Glucose-Capillary: 81 mg/dL (ref 70–99)
Glucose-Capillary: 88 mg/dL (ref 70–99)
Glucose-Capillary: 89 mg/dL (ref 70–99)
Glucose-Capillary: 92 mg/dL (ref 70–99)
Glucose-Capillary: 93 mg/dL (ref 70–99)
Glucose-Capillary: 94 mg/dL (ref 70–99)
Glucose-Capillary: 94 mg/dL (ref 70–99)
Glucose-Capillary: 95 mg/dL (ref 70–99)
Glucose-Capillary: 96 mg/dL (ref 70–99)

## 2019-11-28 LAB — TRIGLYCERIDES: Triglycerides: 3498 mg/dL — ABNORMAL HIGH (ref <150)

## 2019-11-28 LAB — LIPASE, BLOOD: Lipase: 24 U/L (ref 11–51)

## 2019-11-28 MED ORDER — POTASSIUM CHLORIDE CRYS ER 20 MEQ PO TBCR
40.0000 meq | EXTENDED_RELEASE_TABLET | Freq: Once | ORAL | Status: AC
  Administered 2019-11-28: 40 meq via ORAL
  Filled 2019-11-28: qty 2

## 2019-11-28 MED ORDER — POTASSIUM CHLORIDE CRYS ER 20 MEQ PO TBCR
40.0000 meq | EXTENDED_RELEASE_TABLET | Freq: Once | ORAL | Status: AC
  Administered 2019-11-28: 04:00:00 40 meq via ORAL
  Filled 2019-11-28: qty 2

## 2019-11-28 MED ORDER — STERILE WATER FOR INJECTION IJ SOLN
INTRAMUSCULAR | Status: AC
  Administered 2019-11-28: 10 mL
  Filled 2019-11-28: qty 10

## 2019-11-28 NOTE — Progress Notes (Addendum)
[place] for Electrolyte Monitoring and Replacement   Recent Labs: Potassium (mmol/L)  Date Value  11/28/2019 3.6  11/04/2014 3.6   Magnesium (mg/dL)  Date Value  06/19/2019 1.9  03/18/2014 1.9   Calcium (mg/dL)  Date Value  11/28/2019 8.3 (L)   Calcium, Total (mg/dL)  Date Value  11/04/2014 8.5   Albumin (g/dL)  Date Value  11/27/2019 4.0  11/01/2014 3.0 (L)   Phosphorus (mg/dL)  Date Value  01/21/2018 5.1 (H)   Sodium  Date Value  11/28/2019 136 mmol/L  04/12/2018 129 (A)  11/04/2014 137 mmol/L   Assessment: 44 year old male admitted for hypertriglyceridemia. Patient started on insulin drip at 0.2 units/kg/hr. Pharmacy consulted to manage electrolytes.  Goal of Therapy:  Electrolytes WNL, potassium > 4 while on insulin drip  Plan:  KCL 70meq PO x 1 for drop in potassium. Will recheck BMP in am, next draw approximately 0500. Will need to follow closely while on insulin drip. Continue D10NS to accommodate insulin drip.  Ena Dawley, PharmD Clinical Pharmacist 11/28/2019 11:25 PM

## 2019-11-28 NOTE — Progress Notes (Signed)
CBG Rechecked and is at 81.

## 2019-11-28 NOTE — Progress Notes (Signed)
[place] for Electrolyte Monitoring and Replacement   Recent Labs: Potassium (mmol/L)  Date Value  11/28/2019 3.6  11/04/2014 3.6   Magnesium (mg/dL)  Date Value  06/19/2019 1.9  03/18/2014 1.9   Calcium (mg/dL)  Date Value  11/28/2019 7.9 (L)   Calcium, Total (mg/dL)  Date Value  11/04/2014 8.5   Albumin (g/dL)  Date Value  11/27/2019 4.0  11/01/2014 3.0 (L)   Phosphorus (mg/dL)  Date Value  01/21/2018 5.1 (H)   Sodium  Date Value  11/28/2019 137 mmol/L  04/12/2018 129 (A)  11/04/2014 137 mmol/L   Assessment: 44 year old male admitted for hypertriglyceridemia. Patient started on insulin drip at 0.2 units/kg/hr. Pharmacy consulted to manage electrolytes.  Goal of Therapy:  Electrolytes WNL, potassium > 4 while on insulin drip  Plan:  Potassium level = 3.6 @ 0218. Will give KCL 67meq PO x 1 as goal is to keep K+ > 4. Will check BMP q4h, next draw approximately 0600. Will need to follow closely while on insulin drip. Fluids changed to D10NS to accommodate insulin drip.  Follow up electrolytes at 0600.  Ena Dawley ,PharmD Clinical Pharmacist 11/28/2019 3:53 AM

## 2019-11-28 NOTE — Progress Notes (Signed)
CBG 66. Will give pt juice and recheck in fifteen minutes.

## 2019-11-28 NOTE — Progress Notes (Signed)
Progress Note    Sergio [name]  [id number] DOB: 1975/11/15  DOA: 11/26/2019 PCP: System, Pcp Not In      Brief Narrative:    Medical records reviewed and are as summarized below:       Assessment/Plan:   Active Problems:   Acute pancreatitis   Acute pancreatitis/recurrent pancreatitis due to severe hypertriglyceridemia: Triglycerides was greater than 5,000 on admission.  It is down to 3,498 today.  Lipase has normalized.  Continue IV insulin infusion for hypertriglyceridemia.  Continue analgesics as needed for pain.  Antiemetics as needed for nausea.  Continue pancrelipase, Lovaza, gemfibrozil and rosuvastatin.    Type 2 diabetes mellitus: Glucose level is controlled but he still on insulin drip because of hypertriglyceridemia.  He is also on IV dextrose infusion to prevent hypoglycemia.  Peripheral neuropathy: Continue Lyrica  BPH: He is on Flomax  Anxiety depression: Continue Xanax and trazodone.  Obstructive sleep apnea: Continue CPAP at night   Body mass index is 33.5 kg/m.  (Morbid obesity)   Family Communication/Anticipated D/C date and plan/Code Status   DVT prophylaxis: Lovenox Code Status: Full code Family Communication: Plan discussed with patient Disposition Plan: Possible discharge to home in 2 days when abdominal pain and triglycerides level improved.      Subjective:   He complains of abdominal pain.  Objective:    Vitals:   11/28/19 0400 11/28/19 0416 11/28/19 0500 11/28/19 0600  BP: 103/78 (!) 141/88 127/81 121/76  Pulse: 100 (!) 101 98 97  Resp: 18 16 13 12   Temp:      TempSrc:      SpO2: 98% 95% 94% 93%  Weight:      Height:        Intake/Output Summary (Last 24 hours) at 11/28/2019 1009 Last data filed at 11/28/2019 0550 Gross per 24 hour  Intake 3902.2 ml  Output 1650 ml  Net 2252.2 ml   Filed Weights   11/26/19 1441  Weight: 112 kg    Exam:  GEN: NAD SKIN: No rash EYES: EOMI ENT: MMM CV: RRR PULM:  CTA B ABD: soft, ND, epigastric and left upper quadrant tenderness but no rebound tenderness or guarding, +BS CNS: AAO x 3, non focal EXT: No edema or tenderness   Data Reviewed:   I have personally reviewed following labs and imaging studies:  Labs: Labs show the following:   Basic Metabolic Panel: Recent Labs  Lab 11/27/19 0407 11/27/19 0407 11/27/19 1750 11/27/19 1750 11/27/19 2200 11/27/19 2200 11/28/19 0218 11/28/19 0721  NA 132*  --  132*  --  132*  --  137 138  K 3.5   < > 4.7   < > 3.7   < > 3.6 4.2  CL 97*  --  99  --  100  --  102 102  CO2 30  --  28  --  25  --  24 22  GLUCOSE 69*  --  85  --  120*  --  85 81  BUN 9  --  6  --  <5*  --  <5* <5*  CREATININE 0.72  --  0.98  --  0.89  --  0.86 0.92  CALCIUM 8.3*  --  8.0*  --  7.9*  --  7.9* 8.2*   < > = values in this interval not displayed.   GFR Estimated Creatinine Clearance: 135.2 mL/min (by C-G formula based on SCr of 0.92 mg/dL). Liver Function Tests: Recent Labs  Lab 11/26/19 1443 11/27/19 0407  AST 34 28  ALT 41 33  ALKPHOS 50 38  BILITOT 2.6* 0.4  PROT 5.9* 5.7*  ALBUMIN 4.8 4.0   Recent Labs  Lab 11/26/19 1443 11/27/19 0407 11/28/19 0218  LIPASE 478* 56* 24   No results for input(s): AMMONIA in the last 168 hours. Coagulation profile No results for input(s): INR, PROTIME in the last 168 hours.  CBC: Recent Labs  Lab 11/26/19 1443 11/27/19 0407  WBC 5.7 7.5  HGB 15.9 14.0  HCT 45.7 40.9  MCV 80.6 80.8  PLT 223 202   Cardiac Enzymes: No results for input(s): CKTOTAL, CKMB, CKMBINDEX, TROPONINI in the last 168 hours. BNP (last 3 results) No results for input(s): PROBNP in the last 8760 hours. CBG: Recent Labs  Lab 11/28/19 0502 11/28/19 0603 11/28/19 0700 11/28/19 0742 11/28/19 0941  GLUCAP 92 94 88 66* 76   D-Dimer: No results for input(s): DDIMER in the last 72 hours. Hgb A1c: No results for input(s): HGBA1C in the last 72 hours. Lipid Profile: Recent Labs     11/27/19 0407 11/28/19 0218  TRIG >5,000* 3,498*   Thyroid function studies: No results for input(s): TSH, T4TOTAL, T3FREE, THYROIDAB in the last 72 hours.  Invalid input(s): FREET3 Anemia work up: No results for input(s): VITAMINB12, FOLATE, FERRITIN, TIBC, IRON, RETICCTPCT in the last 72 hours. Sepsis Labs: Recent Labs  Lab 11/26/19 1443 11/27/19 0407  WBC 5.7 7.5    Microbiology Recent Results (from the past 240 hour(s))  MRSA PCR Screening     Status: None   Collection Time: 11/26/19 11:34 PM   Specimen: Nasopharyngeal  Result Value Ref Range Status   MRSA by PCR NEGATIVE NEGATIVE Final    Comment:        The GeneXpert MRSA Assay (FDA approved for NASAL specimens only), is one component of a comprehensive MRSA colonization surveillance program. It is not intended to diagnose MRSA infection nor to guide or monitor treatment for MRSA infections. Performed at [facility], [address] [place], [place] [address]     Procedures and diagnostic studies:  US ABDOMEN LIMITED RUQ  Result Date: 11/26/2019 CLINICAL DATA:  Epigastric pain for 3 days EXAM: ULTRASOUND ABDOMEN LIMITED RIGHT UPPER QUADRANT COMPARISON:  01/15/2018, 06/15/2019 FINDINGS: Gallbladder: Cholecystectomy Common bile duct: Diameter: 4 mm Liver: Diffuse increased liver echotexture again noted consistent with fibrofatty infiltration. No focal abnormalities or intrahepatic biliary duct dilation. Portal vein is patent on color Doppler imaging with normal direction of blood flow towards the liver. Other: None. IMPRESSION: 1. Stable fatty infiltration of the liver. 2. Cholecystectomy. Electronically Signed   By: Randa Ngo M.D.   On: 11/26/2019 18:51    Medications:   . Chlorhexidine Gluconate Cloth  6 each Topical Daily  . docusate sodium  100 mg Oral BID  . enoxaparin (LOVENOX) injection  40 mg Subcutaneous Q24H  . gemfibrozil  600 mg Oral BID AC  . lipase/protease/amylase  180,000 Units  Oral TID AC  . lipase/protease/amylase  72,000 Units Oral With snacks  . omega-3 acid ethyl esters  3 g Oral BID  . pregabalin  300 mg Oral QHS  . rosuvastatin  10 mg Oral q1800  . sodium chloride flush  3 mL Intravenous Once  . tamsulosin  0.4 mg Oral Daily   Continuous Infusions: . dextrose 10 % 1,000 mL with sodium chloride 154 mEq infusion 150 mL/hr at 11/28/19 0219  . insulin 0.2 Units/kg/hr (11/28/19 0606)  LOS: 2 days   Andrew Blasius  [facility]     11/28/2019, 10:09 AM

## 2019-11-28 NOTE — Progress Notes (Addendum)
[place] for Electrolyte Monitoring and Replacement   Recent Labs: Potassium (mmol/L)  Date Value  11/28/2019 3.6  11/04/2014 3.6   Magnesium (mg/dL)  Date Value  06/19/2019 1.9  03/18/2014 1.9   Calcium (mg/dL)  Date Value  11/28/2019 8.3 (L)   Calcium, Total (mg/dL)  Date Value  11/04/2014 8.5   Albumin (g/dL)  Date Value  11/27/2019 4.0  11/01/2014 3.0 (L)   Phosphorus (mg/dL)  Date Value  01/21/2018 5.1 (H)   Sodium  Date Value  11/28/2019 139 mmol/L  04/12/2018 129 (A)  11/04/2014 137 mmol/L   Assessment: 44 year old male admitted for hypertriglyceridemia. Patient started on insulin drip at 0.2 units/kg/hr. Pharmacy consulted to manage electrolytes.  Goal of Therapy:  Electrolytes WNL, potassium > 4 while on insulin drip  Plan:  KCL 81meq PO x 1 for drop in potassium. Will recheck BMP this evening, next draw approximately 2200. Will need to follow closely while on insulin drip. Continue D10NS to accommodate insulin drip.  Pearla Dubonnet, PharmD Clinical Pharmacist 11/28/2019 7:07 PM

## 2019-11-29 LAB — BASIC METABOLIC PANEL
Anion gap: 8 (ref 5–15)
Anion gap: 7 (ref 5–15)
BUN: <5 mg/dL — ABNORMAL LOW (ref 6–20)
BUN: <5 mg/dL — ABNORMAL LOW (ref 6–20)
CO2: 26 mmol/L (ref 22–32)
CO2: 27 mmol/L (ref 22–32)
Calcium: 8.1 mg/dL — ABNORMAL LOW (ref 8.9–10.3)
Calcium: 8.5 mg/dL — ABNORMAL LOW (ref 8.9–10.3)
Chloride: 106 mmol/L (ref 98–111)
Chloride: 103 mmol/L (ref 98–111)
Creatinine, Ser: 0.87 mg/dL (ref 0.61–1.24)
Creatinine, Ser: 0.84 mg/dL (ref 0.61–1.24)
GFR calc Af Amer: >60 mL/min (ref >60)
GFR calc Af Amer: >60 mL/min (ref >60)
GFR calc non Af Amer: >60 mL/min (ref >60)
GFR calc non Af Amer: >60 mL/min (ref >60)
Glucose, Bld: 100 mg/dL — ABNORMAL HIGH (ref 70–99)
Glucose, Bld: 77 mg/dL (ref 70–99)
Potassium: 3.6 mmol/L (ref 3.5–5.1)
Potassium: 3.7 mmol/L (ref 3.5–5.1)
Sodium: 140 mmol/L (ref 135–145)
Sodium: 137 mmol/L (ref 135–145)

## 2019-11-29 LAB — GLUCOSE, CAPILLARY
Glucose-Capillary: 100 mg/dL — ABNORMAL HIGH (ref 70–99)
Glucose-Capillary: 105 mg/dL — ABNORMAL HIGH (ref 70–99)
Glucose-Capillary: 107 mg/dL — ABNORMAL HIGH (ref 70–99)
Glucose-Capillary: 118 mg/dL — ABNORMAL HIGH (ref 70–99)
Glucose-Capillary: 123 mg/dL — ABNORMAL HIGH (ref 70–99)
Glucose-Capillary: 123 mg/dL — ABNORMAL HIGH (ref 70–99)
Glucose-Capillary: 146 mg/dL — ABNORMAL HIGH (ref 70–99)
Glucose-Capillary: 63 mg/dL — ABNORMAL LOW (ref 70–99)
Glucose-Capillary: 80 mg/dL (ref 70–99)
Glucose-Capillary: 82 mg/dL (ref 70–99)
Glucose-Capillary: 84 mg/dL (ref 70–99)
Glucose-Capillary: 84 mg/dL (ref 70–99)
Glucose-Capillary: 85 mg/dL (ref 70–99)
Glucose-Capillary: 89 mg/dL (ref 70–99)
Glucose-Capillary: 89 mg/dL (ref 70–99)
Glucose-Capillary: 94 mg/dL (ref 70–99)
Glucose-Capillary: 94 mg/dL (ref 70–99)
Glucose-Capillary: 95 mg/dL (ref 70–99)
Glucose-Capillary: 97 mg/dL (ref 70–99)

## 2019-11-29 LAB — POTASSIUM: Potassium: 3.8 mmol/L (ref 3.5–5.1)

## 2019-11-29 LAB — TRIGLYCERIDES: Triglycerides: 1597 mg/dL — ABNORMAL HIGH (ref <150)

## 2019-11-29 LAB — LIPASE, BLOOD: Lipase: 18 U/L (ref 11–51)

## 2019-11-29 MED ORDER — POTASSIUM CHLORIDE CRYS ER 20 MEQ PO TBCR
40.0000 meq | EXTENDED_RELEASE_TABLET | Freq: Once | ORAL | Status: AC
  Administered 2019-11-29: 08:00:00 40 meq via ORAL
  Filled 2019-11-29: qty 2

## 2019-11-29 MED ORDER — POTASSIUM CHLORIDE CRYS ER 20 MEQ PO TBCR
40.0000 meq | EXTENDED_RELEASE_TABLET | Freq: Once | ORAL | Status: AC
  Administered 2019-11-29: 14:00:00 40 meq via ORAL
  Filled 2019-11-29: qty 2

## 2019-11-29 NOTE — Progress Notes (Addendum)
Progress Note    Sergio [name]  [id number] DOB: Feb 26, 1976  DOA: 11/26/2019 PCP: System, Pcp Not In      Brief Narrative:    Medical records reviewed and are as summarized below:       Assessment/Plan:   Principal Problem:   Acute pancreatitis Active Problems:   Hypertriglyceridemia   Acute pancreatitis/recurrent pancreatitis due to severe hypertriglyceridemia: Triglycerides was greater than 5,000 on admission.  It is trending down.  Lipase has normalized.  Continue IV insulin infusion for hypertriglyceridemia.  Continue analgesics as needed for pain.  Antiemetics as needed for nausea.  Continue pancrelipase, Lovaza, gemfibrozil and rosuvastatin.  Advance diet as tolerated.  Type 2 diabetes mellitus: Glucose level is controlled but he still on insulin drip because of hypertriglyceridemia.  He is also on IV dextrose infusion to prevent hypoglycemia.  Peripheral neuropathy: Continue Lyrica  BPH: He is on Flomax  Anxiety depression: Continue Xanax and trazodone.  Obstructive sleep apnea: Continue CPAP at night   Body mass index is 33.5 kg/m.  (Morbid obesity)   Family Communication/Anticipated D/C date and plan/Code Status   DVT prophylaxis: Lovenox Code Status: Full code Family Communication: Plan discussed with patient Disposition Plan: Patient is from home.  Possible discharge to home tomorrow when abdominal pain and nausea improve and patient is able to tolerate a diet.     Subjective:   He still complains of abdominal pain and nausea.  He said he could not eat his breakfast this morning.  Objective:    Vitals:   11/29/19 1400 11/29/19 1450 11/29/19 1500 11/29/19 1600  BP:  122/84 125/85 114/74  Pulse:  84 89 87  Resp: 13 13 20 13   Temp: 98.5 F (36.9 C)     TempSrc: Oral     SpO2: 98% 97% 98% 98%  Weight:      Height:        Intake/Output Summary (Last 24 hours) at 11/29/2019 1638 Last data filed at 11/29/2019 0600 Gross per 24  hour  Intake 880.67 ml  Output 650 ml  Net 230.67 ml   Filed Weights   11/26/19 1441  Weight: 112 kg    Exam:  GEN: NAD SKIN: No rash EYES: EOMI ENT: MMM CV: RRR PULM: CTA B ABD: Soft, epigastric and left upper quadrant tenderness,+ BS CNS: AAO x 3, non focal EXT: No edema or tenderness   Data Reviewed:   I have personally reviewed following labs and imaging studies:  Labs: Labs show the following:   Basic Metabolic Panel: Recent Labs  Lab 11/28/19 0955 11/28/19 0955 11/28/19 1446 11/28/19 1446 11/28/19 2158 11/28/19 2158 11/29/19 0323 11/29/19 1042  NA 136  --  139  --  136  --  140 137  K UNABLE TO REPORT DUE TO HEMOLYSIS SDR   < > 3.6   < > 3.6   < > 3.6 3.7  CL 102  --  102  --  103  --  106 103  CO2 22  --  24  --  26  --  26 27  GLUCOSE 65*  --  72  --  128*  --  100* 77  BUN <5*  --  <5*  --  <5*  --  <5* <5*  CREATININE 0.91  --  0.77  --  0.82  --  0.87 0.84  CALCIUM 8.3*  --  8.3*  --  8.3*  --  8.1* 8.5*   < > =  values in this interval not displayed.   GFR Estimated Creatinine Clearance: 148.1 mL/min (by C-G formula based on SCr of 0.84 mg/dL). Liver Function Tests: Recent Labs  Lab 11/26/19 1443 11/27/19 0407  AST 34 28  ALT 41 33  ALKPHOS 50 38  BILITOT 2.6* 0.4  PROT 5.9* 5.7*  ALBUMIN 4.8 4.0   Recent Labs  Lab 11/26/19 1443 11/27/19 0407 11/28/19 0218 11/29/19 0323  LIPASE 478* 56* 24 18   No results for input(s): AMMONIA in the last 168 hours. Coagulation profile No results for input(s): INR, PROTIME in the last 168 hours.  CBC: Recent Labs  Lab 11/26/19 1443 11/27/19 0407  WBC 5.7 7.5  HGB 15.9 14.0  HCT 45.7 40.9  MCV 80.6 80.8  PLT 223 202   Cardiac Enzymes: No results for input(s): CKTOTAL, CKMB, CKMBINDEX, TROPONINI in the last 168 hours. BNP (last 3 results) No results for input(s): PROBNP in the last 8760 hours. CBG: Recent Labs  Lab 11/29/19 1105 11/29/19 1350 11/29/19 1352 11/29/19 1443  11/29/19 1556  GLUCAP 84 94 97 89 85   D-Dimer: No results for input(s): DDIMER in the last 72 hours. Hgb A1c: No results for input(s): HGBA1C in the last 72 hours. Lipid Profile: Recent Labs    11/28/19 0218 11/29/19 0323  TRIG 3,498* 1,597*   Thyroid function studies: No results for input(s): TSH, T4TOTAL, T3FREE, THYROIDAB in the last 72 hours.  Invalid input(s): FREET3 Anemia work up: No results for input(s): VITAMINB12, FOLATE, FERRITIN, TIBC, IRON, RETICCTPCT in the last 72 hours. Sepsis Labs: Recent Labs  Lab 11/26/19 1443 11/27/19 0407  WBC 5.7 7.5    Microbiology Recent Results (from the past 240 hour(s))  MRSA PCR Screening     Status: None   Collection Time: 11/26/19 11:34 PM   Specimen: Nasopharyngeal  Result Value Ref Range Status   MRSA by PCR NEGATIVE NEGATIVE Final    Comment:        The GeneXpert MRSA Assay (FDA approved for NASAL specimens only), is one component of a comprehensive MRSA colonization surveillance program. It is not intended to diagnose MRSA infection nor to guide or monitor treatment for MRSA infections. Performed at [facility], [address] [place], [place] [address]     Procedures and diagnostic studies:  No results found.  Medications:   . Chlorhexidine Gluconate Cloth  6 each Topical Daily  . docusate sodium  100 mg Oral BID  . enoxaparin (LOVENOX) injection  40 mg Subcutaneous Q24H  . gemfibrozil  600 mg Oral BID AC  . lipase/protease/amylase  180,000 Units Oral TID AC  . lipase/protease/amylase  72,000 Units Oral With snacks  . omega-3 acid ethyl esters  3 g Oral BID  . pregabalin  300 mg Oral QHS  . rosuvastatin  10 mg Oral q1800  . sodium chloride flush  3 mL Intravenous Once  . tamsulosin  0.4 mg Oral Daily   Continuous Infusions: . dextrose 10 % 1,000 mL with sodium chloride 154 mEq infusion 150 mL/hr at 11/29/19 1427  . insulin 0.2 Units/kg/hr (11/29/19 1431)     LOS: 3 days    Miro Balderson  [facility]     11/29/2019, 4:38 PM

## 2019-11-29 NOTE — Progress Notes (Signed)
[place] for Electrolyte Monitoring and Replacement   Recent Labs: Potassium (mmol/L)  Date Value  11/29/2019 3.7  11/04/2014 3.6   Magnesium (mg/dL)  Date Value  06/19/2019 1.9  03/18/2014 1.9   Calcium (mg/dL)  Date Value  11/29/2019 8.5 (L)   Calcium, Total (mg/dL)  Date Value  11/04/2014 8.5   Albumin (g/dL)  Date Value  11/27/2019 4.0  11/01/2014 3.0 (L)   Phosphorus (mg/dL)  Date Value  01/21/2018 5.1 (H)   Sodium  Date Value  11/29/2019 137 mmol/L  04/12/2018 129 (A)  11/04/2014 137 mmol/L   Assessment: 44 year old male admitted for hypertriglyceridemia. Patient started on insulin drip at 0.2 units/kg/hr. Pharmacy consulted to manage electrolytes.  Goal of Therapy:  Electrolytes WNL, potassium > 4 while on insulin drip  Plan:  Potassium has been maintaining between 3.5-4.0. However has been receiving supplementation fairly regularly. Will order KCL 1meq PO x 1 to maintain. Will recheck BMP at 2000. Will need to follow closely while on insulin drip. Continue D10NS to accommodate insulin drip.  Tawnya Crook, PharmD Clinical Pharmacist 11/29/2019 1:20 PM

## 2019-11-29 NOTE — Progress Notes (Signed)
[place] for Electrolyte Monitoring and Replacement   Recent Labs: Potassium (mmol/L)  Date Value  11/29/2019 3.6  11/04/2014 3.6   Magnesium (mg/dL)  Date Value  06/19/2019 1.9  03/18/2014 1.9   Calcium (mg/dL)  Date Value  11/29/2019 8.1 (L)   Calcium, Total (mg/dL)  Date Value  11/04/2014 8.5   Albumin (g/dL)  Date Value  11/27/2019 4.0  11/01/2014 3.0 (L)   Phosphorus (mg/dL)  Date Value  01/21/2018 5.1 (H)   Sodium  Date Value  11/29/2019 140 mmol/L  04/12/2018 129 (A)  11/04/2014 137 mmol/L   Assessment: 44 year old male admitted for hypertriglyceridemia. Patient started on insulin drip at 0.2 units/kg/hr. Pharmacy consulted to manage electrolytes.  Goal of Therapy:  Electrolytes WNL, potassium > 4 while on insulin drip  Plan:  KCL 40meq PO x 1 for drop in potassium. K+ still holding steady at 3.6.  Will recheck BMP at 10am to follow up K+ level.  Will need to follow closely while on insulin drip.  Continue D10NS to accommodate insulin drip.  Ena Dawley, PharmD Clinical Pharmacist 11/29/2019 6:42 AM

## 2019-11-30 LAB — GLUCOSE, CAPILLARY
Glucose-Capillary: 93 mg/dL (ref 70–99)
Glucose-Capillary: 89 mg/dL (ref 70–99)
Glucose-Capillary: 80 mg/dL (ref 70–99)
Glucose-Capillary: 76 mg/dL (ref 70–99)
Glucose-Capillary: 98 mg/dL (ref 70–99)
Glucose-Capillary: 82 mg/dL (ref 70–99)
Glucose-Capillary: 79 mg/dL (ref 70–99)
Glucose-Capillary: 95 mg/dL (ref 70–99)
Glucose-Capillary: 97 mg/dL (ref 70–99)
Glucose-Capillary: 90 mg/dL (ref 70–99)
Glucose-Capillary: 103 mg/dL — ABNORMAL HIGH (ref 70–99)
Glucose-Capillary: 93 mg/dL (ref 70–99)
Glucose-Capillary: 99 mg/dL (ref 70–99)
Glucose-Capillary: 140 mg/dL — ABNORMAL HIGH (ref 70–99)
Glucose-Capillary: 205 mg/dL — ABNORMAL HIGH (ref 70–99)

## 2019-11-30 LAB — BASIC METABOLIC PANEL
Anion gap: 4 — ABNORMAL LOW (ref 5–15)
Anion gap: 10 (ref 5–15)
BUN: <5 mg/dL — ABNORMAL LOW (ref 6–20)
BUN: <5 mg/dL — ABNORMAL LOW (ref 6–20)
CO2: 28 mmol/L (ref 22–32)
CO2: 29 mmol/L (ref 22–32)
Calcium: 8.3 mg/dL — ABNORMAL LOW (ref 8.9–10.3)
Calcium: 8.5 mg/dL — ABNORMAL LOW (ref 8.9–10.3)
Chloride: 104 mmol/L (ref 98–111)
Chloride: 100 mmol/L (ref 98–111)
Creatinine, Ser: 0.82 mg/dL (ref 0.61–1.24)
Creatinine, Ser: 0.93 mg/dL (ref 0.61–1.24)
GFR calc Af Amer: >60 mL/min (ref >60)
GFR calc Af Amer: >60 mL/min (ref >60)
GFR calc non Af Amer: >60 mL/min (ref >60)
GFR calc non Af Amer: >60 mL/min (ref >60)
Glucose, Bld: 99 mg/dL (ref 70–99)
Glucose, Bld: 88 mg/dL (ref 70–99)
Potassium: 3.4 mmol/L — ABNORMAL LOW (ref 3.5–5.1)
Potassium: 3.5 mmol/L (ref 3.5–5.1)
Sodium: 136 mmol/L (ref 135–145)
Sodium: 139 mmol/L (ref 135–145)

## 2019-11-30 LAB — POTASSIUM: Potassium: 3.8 mmol/L (ref 3.5–5.1)

## 2019-11-30 LAB — LIPASE, BLOOD: Lipase: 22 U/L (ref 11–51)

## 2019-11-30 LAB — TRIGLYCERIDES: Triglycerides: 1085 mg/dL — ABNORMAL HIGH (ref <150)

## 2019-11-30 MED ORDER — POTASSIUM CHLORIDE CRYS ER 20 MEQ PO TBCR
40.0000 meq | EXTENDED_RELEASE_TABLET | Freq: Once | ORAL | Status: AC
  Administered 2019-11-30: 40 meq via ORAL
  Filled 2019-11-30: qty 2

## 2019-11-30 MED ORDER — POTASSIUM CHLORIDE CRYS ER 20 MEQ PO TBCR
40.0000 meq | EXTENDED_RELEASE_TABLET | Freq: Once | ORAL | Status: AC
  Administered 2019-11-30: 12:00:00 40 meq via ORAL
  Filled 2019-11-30: qty 2

## 2019-11-30 MED ORDER — KETOROLAC TROMETHAMINE 15 MG/ML IJ SOLN: 15.0000 mg | Freq: Four times a day (QID) | INTRAMUSCULAR | Status: DC | PRN

## 2019-11-30 MED ORDER — OXYCODONE HCL 5 MG PO TABS
10.0000 mg | ORAL_TABLET | Freq: Four times a day (QID) | ORAL | Status: DC | PRN
  Administered 2019-11-30 – 2019-12-01 (×3): 10 mg via ORAL
  Administered 2019-12-01: 5 mg via ORAL
  Administered 2019-12-01: 10 mg via ORAL
  Administered 2019-12-01: 5 mg via ORAL
  Administered 2019-12-02 – 2019-12-03 (×3): 10 mg via ORAL
  Filled 2019-11-30 (×9): qty 2

## 2019-11-30 NOTE — Progress Notes (Signed)
[place] for Electrolyte Monitoring and Replacement   Recent Labs: Potassium (mmol/L)  Date Value  11/30/2019 3.4 (L)  11/04/2014 3.6   Magnesium (mg/dL)  Date Value  06/19/2019 1.9  03/18/2014 1.9   Calcium (mg/dL)  Date Value  11/30/2019 8.3 (L)   Calcium, Total (mg/dL)  Date Value  11/04/2014 8.5   Albumin (g/dL)  Date Value  11/27/2019 4.0  11/01/2014 3.0 (L)   Phosphorus (mg/dL)  Date Value  01/21/2018 5.1 (H)   Sodium  Date Value  11/30/2019 136 mmol/L  04/12/2018 129 (A)  11/04/2014 137 mmol/L   Assessment: 44 year old male admitted for hypertriglyceridemia. Patient started on insulin drip at 0.2 units/kg/hr. Pharmacy consulted to manage electrolytes.  Goal of Therapy:  Electrolytes WNL, potassium > 4 while on insulin drip  Plan:  Potassium 3.4 on 2/19 @ 0219.  Potassium has been maintaining between 3.5-4.0. Patient has been receiving supplementation fairly regularly. Will order KCL 27meq PO x 1 now. Will recheck BMP at 0800. Will need to follow closely while on insulin drip. Continue D10NS to accommodate insulin drip.  Ena Dawley, PharmD Clinical Pharmacist 11/30/2019 3:08 AM

## 2019-11-30 NOTE — Progress Notes (Signed)
[place] for Electrolyte Monitoring and Replacement   Recent Labs: Potassium (mmol/L)  Date Value  11/30/2019 3.8  11/04/2014 3.6   Magnesium (mg/dL)  Date Value  06/19/2019 1.9  03/18/2014 1.9   Calcium (mg/dL)  Date Value  11/30/2019 8.5 (L)   Calcium, Total (mg/dL)  Date Value  11/04/2014 8.5   Albumin (g/dL)  Date Value  11/27/2019 4.0  11/01/2014 3.0 (L)   Phosphorus (mg/dL)  Date Value  01/21/2018 5.1 (H)   Sodium  Date Value  11/30/2019 139 mmol/L  04/12/2018 129 (A)  11/04/2014 137 mmol/L   Assessment: 44 year old male admitted for hypertriglyceridemia. Patient started on insulin drip at 0.2 units/kg/hr. Pharmacy consulted to manage electrolytes.  Goal of Therapy:  Electrolytes WNL, potassium > 4 while on insulin drip  Plan:  Potassium mostly maintaining between 3.5-4.0 with patient receiving supplementation fairly regularly. Trying to check electrolytes approximately q6h. Will need to follow closely while on insulin drip. Continue D10NS to accommodate insulin drip.  -2/19 @ 1831: K 3.8, will give PO potassium chloride 40 mEq x 1 and re-check electrolytes tomorrow AM  [place] Resident 11/30/2019 10:58 PM

## 2019-11-30 NOTE — Progress Notes (Signed)
[place] for Electrolyte Monitoring and Replacement   Recent Labs: Potassium (mmol/L)  Date Value  11/30/2019 3.5  11/04/2014 3.6   Magnesium (mg/dL)  Date Value  06/19/2019 1.9  03/18/2014 1.9   Calcium (mg/dL)  Date Value  11/30/2019 8.5 (L)   Calcium, Total (mg/dL)  Date Value  11/04/2014 8.5   Albumin (g/dL)  Date Value  11/27/2019 4.0  11/01/2014 3.0 (L)   Phosphorus (mg/dL)  Date Value  01/21/2018 5.1 (H)   Sodium  Date Value  11/30/2019 139 mmol/L  04/12/2018 129 (A)  11/04/2014 137 mmol/L   Assessment: 44 year old male admitted for hypertriglyceridemia. Patient started on insulin drip at 0.2 units/kg/hr. Pharmacy consulted to manage electrolytes.  Goal of Therapy:  Electrolytes WNL, potassium > 4 while on insulin drip  Plan:  Potassium mostly maintaining between 3.5-4.0 with patient receiving supplementation fairly regularly. Patient received potassium 40 mEq PO x 1 for K 3.5. Will recheck potassium at 1800. Trying to check electrolytes approximately q6h. Will need to follow closely while on insulin drip. Continue D10NS to accommodate insulin drip.  Tawnya Crook, PharmD Clinical Pharmacist 11/30/2019 1:31 PM

## 2019-11-30 NOTE — Progress Notes (Signed)
Progress Note    Sergio [name]  [id number] DOB: 1976-06-08  DOA: 11/26/2019 PCP: System, Pcp Not In      Brief Narrative:    Medical records reviewed and are as summarized below:       Assessment/Plan:   Principal Problem:   Acute pancreatitis Active Problems:   Hypertriglyceridemia   Acute pancreatitis/recurrent pancreatitis due to severe hypertriglyceridemia: Triglycerides was greater than 5,000 on admission.  It is trending down.  Lipase has normalized.  Continue IV insulin infusion for hypertriglyceridemia.  Continue IV morphine as needed for pain.  Antiemetics as needed for nausea.  Continue pancrelipase, Lovaza, gemfibrozil and rosuvastatin.  Advance diet as tolerated.  Added oxycodone for pain control.  He said he has tolerated oxycodone in the past despite a history of anaphylaxis to codeine.  Type 2 diabetes mellitus: Glucose level is controlled but he's still on insulin drip because of hypertriglyceridemia.  He is also on IV dextrose infusion to prevent hypoglycemia.  Peripheral neuropathy: Continue Lyrica  BPH: He is on Flomax  Anxiety depression: Continue Xanax and trazodone.  Obstructive sleep apnea: Continue CPAP at night   Body mass index is 33.5 kg/m.  (Morbid obesity)   Family Communication/Anticipated D/C date and plan/Code Status   DVT prophylaxis: Lovenox Code Status: Full code Family Communication: Plan discussed with patient Disposition Plan: Patient is from home.  Possible discharge to home tomorrow when abdominal pain and nausea improve and patient is able to tolerate a diet.     Subjective:   He still has a lot of nausea and abdominal pain.  He has not been able to tolerate any diet thus far.  He requested additional pain medicine besides IV morphine.  Objective:    Vitals:   11/30/19 0800 11/30/19 0900 11/30/19 1200 11/30/19 1300  BP: (!) 145/91 122/84 122/90   Pulse: 87 82 87   Resp: 10 11 14 13   Temp:        TempSrc:      SpO2: 100% 94% 97%   Weight:      Height:        Intake/Output Summary (Last 24 hours) at 11/30/2019 1606 Last data filed at 11/30/2019 1600 Gross per 24 hour  Intake 7787.75 ml  Output 5050 ml  Net 2737.75 ml   Filed Weights   11/26/19 1441  Weight: 112 kg    Exam:  GEN: NAD SKIN: No rash EYES: EOMI ENT: MMM CV: RRR PULM: No wheezing or rales ABD: Soft, epigastric and left upper quadrant tenderness,+ BS CNS: AAO x 3, non focal EXT: No edema or tenderness   Data Reviewed:   I have personally reviewed following labs and imaging studies:  Labs: Labs show the following:   Basic Metabolic Panel: Recent Labs  Lab 11/28/19 2158 11/28/19 2158 11/29/19 0323 11/29/19 0323 11/29/19 1042 11/29/19 1946 11/30/19 0219 11/30/19 0833  NA 136  --  140  --  137  --  136 139  K 3.6   < > 3.6   < > 3.7   < > 3.4* 3.5  CL 103  --  106  --  103  --  104 100  CO2 26  --  26  --  27  --  28 29  GLUCOSE 128*  --  100*  --  77  --  99 88  BUN <5*  --  <5*  --  <5*  --  <5* <5*  CREATININE 0.82  --  0.87  --  0.84  --  0.82 0.93  CALCIUM 8.3*  --  8.1*  --  8.5*  --  8.3* 8.5*   < > = values in this interval not displayed.   GFR Estimated Creatinine Clearance: 133.8 mL/min (by C-G formula based on SCr of 0.93 mg/dL). Liver Function Tests: Recent Labs  Lab 11/26/19 1443 11/27/19 0407  AST 34 28  ALT 41 33  ALKPHOS 50 38  BILITOT 2.6* 0.4  PROT 5.9* 5.7*  ALBUMIN 4.8 4.0   Recent Labs  Lab 11/26/19 1443 11/27/19 0407 11/28/19 0218 11/29/19 0323 11/30/19 0219  LIPASE 478* 56* 24 18 22    No results for input(s): AMMONIA in the last 168 hours. Coagulation profile No results for input(s): INR, PROTIME in the last 168 hours.  CBC: Recent Labs  Lab 11/26/19 1443 11/27/19 0407  WBC 5.7 7.5  HGB 15.9 14.0  HCT 45.7 40.9  MCV 80.6 80.8  PLT 223 202   Cardiac Enzymes: No results for input(s): CKTOTAL, CKMB, CKMBINDEX, TROPONINI in the last 168  hours. BNP (last 3 results) No results for input(s): PROBNP in the last 8760 hours. CBG: Recent Labs  Lab 11/30/19 0819 11/30/19 0918 11/30/19 1022 11/30/19 1116 11/30/19 1433  GLUCAP 95 97 90 103* 93   D-Dimer: No results for input(s): DDIMER in the last 72 hours. Hgb A1c: No results for input(s): HGBA1C in the last 72 hours. Lipid Profile: Recent Labs    11/29/19 0323 11/30/19 0219  TRIG 1,597* 1,085*   Thyroid function studies: No results for input(s): TSH, T4TOTAL, T3FREE, THYROIDAB in the last 72 hours.  Invalid input(s): FREET3 Anemia work up: No results for input(s): VITAMINB12, FOLATE, FERRITIN, TIBC, IRON, RETICCTPCT in the last 72 hours. Sepsis Labs: Recent Labs  Lab 11/26/19 1443 11/27/19 0407  WBC 5.7 7.5    Microbiology Recent Results (from the past 240 hour(s))  MRSA PCR Screening     Status: None   Collection Time: 11/26/19 11:34 PM   Specimen: Nasopharyngeal  Result Value Ref Range Status   MRSA by PCR NEGATIVE NEGATIVE Final    Comment:        The GeneXpert MRSA Assay (FDA approved for NASAL specimens only), is one component of a comprehensive MRSA colonization surveillance program. It is not intended to diagnose MRSA infection nor to guide or monitor treatment for MRSA infections. Performed at [facility], [address] [place], [place] [address]     Procedures and diagnostic studies:  No results found.  Medications:   . Chlorhexidine Gluconate Cloth  6 each Topical Daily  . docusate sodium  100 mg Oral BID  . enoxaparin (LOVENOX) injection  40 mg Subcutaneous Q24H  . gemfibrozil  600 mg Oral BID AC  . lipase/protease/amylase  180,000 Units Oral TID AC  . lipase/protease/amylase  72,000 Units Oral With snacks  . omega-3 acid ethyl esters  3 g Oral BID  . pregabalin  300 mg Oral QHS  . rosuvastatin  10 mg Oral q1800  . sodium chloride flush  3 mL Intravenous Once  . tamsulosin  0.4 mg Oral Daily   Continuous  Infusions: . dextrose 10 % 1,000 mL with sodium chloride 154 mEq infusion 150 mL/hr at 11/30/19 0513  . insulin 0.2 Units/kg/hr (11/30/19 1203)     LOS: 4 days   Therma Lasure  [facility]     11/30/2019, 4:06 PM

## 2019-11-30 NOTE — Progress Notes (Deleted)
[place] for Electrolyte Monitoring and Replacement   Recent Labs: Potassium (mmol/L)  Date Value  11/30/2019 3.5  11/04/2014 3.6   Magnesium (mg/dL)  Date Value  06/19/2019 1.9  03/18/2014 1.9   Calcium (mg/dL)  Date Value  11/30/2019 8.5 (L)   Calcium, Total (mg/dL)  Date Value  11/04/2014 8.5   Albumin (g/dL)  Date Value  11/27/2019 4.0  11/01/2014 3.0 (L)   Phosphorus (mg/dL)  Date Value  01/21/2018 5.1 (H)   Sodium  Date Value  11/30/2019 139 mmol/L  04/12/2018 129 (A)  11/04/2014 137 mmol/L   Assessment: 44 year old male admitted for hypertriglyceridemia. Patient started on insulin drip at 0.2 units/kg/hr. Pharmacy consulted to manage electrolytes.  Goal of Therapy:  Electrolytes WNL, potassium > 4 while on insulin drip  Plan:  Potassium 3.5 on 2/19 @ 0833 .  Potassium has been maintaining between 3.5-4.0. Patient has been receiving supplementation fairly regularly. Ordered KCL 35meq PO x 1 now. Will need to follow closely while on insulin drip. Continue D10NS to accommodate insulin drip.  Faylene Million,  Pharmacy Student 11/30/2019 1:22 PM

## 2019-12-01 LAB — BASIC METABOLIC PANEL
Anion gap: 7 (ref 5–15)
BUN: <5 mg/dL — ABNORMAL LOW (ref 6–20)
CO2: 29 mmol/L (ref 22–32)
Calcium: 8.5 mg/dL — ABNORMAL LOW (ref 8.9–10.3)
Chloride: 102 mmol/L (ref 98–111)
Creatinine, Ser: 0.91 mg/dL (ref 0.61–1.24)
GFR calc Af Amer: >60 mL/min (ref >60)
GFR calc non Af Amer: >60 mL/min (ref >60)
Glucose, Bld: 77 mg/dL (ref 70–99)
Potassium: 3.6 mmol/L (ref 3.5–5.1)
Sodium: 138 mmol/L (ref 135–145)

## 2019-12-01 LAB — GLUCOSE, CAPILLARY
Glucose-Capillary: 100 mg/dL — ABNORMAL HIGH (ref 70–99)
Glucose-Capillary: 119 mg/dL — ABNORMAL HIGH (ref 70–99)
Glucose-Capillary: 135 mg/dL — ABNORMAL HIGH (ref 70–99)
Glucose-Capillary: 146 mg/dL — ABNORMAL HIGH (ref 70–99)
Glucose-Capillary: 190 mg/dL — ABNORMAL HIGH (ref 70–99)
Glucose-Capillary: 193 mg/dL — ABNORMAL HIGH (ref 70–99)
Glucose-Capillary: 205 mg/dL — ABNORMAL HIGH (ref 70–99)
Glucose-Capillary: 207 mg/dL — ABNORMAL HIGH (ref 70–99)
Glucose-Capillary: 65 mg/dL — ABNORMAL LOW (ref 70–99)

## 2019-12-01 LAB — TRIGLYCERIDES: Triglycerides: 1185 mg/dL — ABNORMAL HIGH (ref <150)

## 2019-12-01 LAB — POTASSIUM: Potassium: 3.9 mmol/L (ref 3.5–5.1)

## 2019-12-01 LAB — LIPASE, BLOOD: Lipase: 20 U/L (ref 11–51)

## 2019-12-01 MED ORDER — DEXTROSE-NACL 5-0.9 % IV SOLN
INTRAVENOUS | Status: DC
  Administered 2019-12-01: 09:00:00 150 mL/h via INTRAVENOUS

## 2019-12-01 MED ORDER — POTASSIUM CHLORIDE CRYS ER 20 MEQ PO TBCR: 40.0000 meq | EXTENDED_RELEASE_TABLET | ORAL | Status: AC

## 2019-12-01 MED ORDER — DEXTROSE 10 % IV SOLN: INTRAVENOUS | Status: DC

## 2019-12-01 NOTE — Progress Notes (Signed)
[facility] [place] for Electrolyte Monitoring and Replacement   Recent Labs: Potassium (mmol/L)  Date Value  12/01/2019 3.6  11/04/2014 3.6   Magnesium (mg/dL)  Date Value  06/19/2019 1.9  03/18/2014 1.9   Calcium (mg/dL)  Date Value  12/01/2019 8.5 (L)   Calcium, Total (mg/dL)  Date Value  11/04/2014 8.5   Albumin (g/dL)  Date Value  11/27/2019 4.0  11/01/2014 3.0 (L)   Phosphorus (mg/dL)  Date Value  01/21/2018 5.1 (H)   Sodium  Date Value  12/01/2019 138 mmol/L  04/12/2018 129 (A)  11/04/2014 137 mmol/L   Assessment: 44 year old male admitted for hypertriglyceridemia. Patient started on insulin drip at 0.2 units/kg/hr. Pharmacy consulted to manage electrolytes.  Potassium mostly maintaining between 3.5-4.0 with patient receiving supplementation fairly regularly. Trying to check electrolytes approximately q6h. Will need to follow closely while on insulin drip. Continue D10NS to accommodate insulin drip.  -2/19 @ 1831: K 3.8, will give PO potassium chloride 40 mEq x 1 and re-check electrolytes tomorrow AM  Goal of Therapy:  Electrolytes WNL, potassium > 4 while on insulin drip  Plan: 2/20 0522  K 3.6   TG 1185. Insulin drip at 22.4 ml/hr. On D10 IVF also Will order KCL PO 40 meq x2 doses Will f/u K ~1400 and with am labs   Chinita Greenland PharmD Clinical Pharmacist 12/01/2019

## 2019-12-01 NOTE — Progress Notes (Signed)
Progress Note    Sergio [name]  [id number] DOB: 1976-01-10  DOA: 11/26/2019 PCP: System, Pcp Not In      Brief Narrative:    Medical records reviewed and are as summarized below:       Assessment/Plan:   Principal Problem:   Acute pancreatitis Active Problems:   Hypertriglyceridemia   Acute pancreatitis/recurrent pancreatitis due to severe hypertriglyceridemia: Triglycerides was greater than 5,000 on admission.  Triglycerides went from 1,085 to 1,185 overnight.  Lipase has normalized.  Continue IV insulin infusion for hypertriglyceridemia.  Continue IV morphine and oxycodone as needed for pain.  Antiemetics as needed for nausea.  Continue pancrelipase, Lovaza, gemfibrozil and rosuvastatin.  Advance diet as tolerated.   Type 2 diabetes mellitus: Glucose level is controlled but he's still on insulin drip because of hypertriglyceridemia.  He developed hypoglycemia with glucose of 65 this morning. He is also on IV dextrose infusion to prevent hypoglycemia (10% dextrose infusion preferred because of risk of hypoglycemia).  Peripheral neuropathy: Continue Lyrica  BPH: He is on Flomax  Anxiety depression: Continue Xanax and trazodone.  Obstructive sleep apnea: Continue CPAP at night   Body mass index is 33.5 kg/m.  (Morbid obesity)   Family Communication/Anticipated D/C date and plan/Code Status   DVT prophylaxis: Lovenox Code Status: Full code Family Communication: Plan discussed with patient Disposition Plan: Patient is from home.  Possible discharge to home tomorrow when abdominal pain and nausea improve and patient is able to tolerate a diet.     Subjective:   Unfortunately, he continues to have significant nausea and abdominal pain.  He developed worsening abdominal pain after eating.  No vomiting  Objective:    Vitals:   12/01/19 0400 12/01/19 0600 12/01/19 0800 12/01/19 1200  BP: 110/79 102/77 130/86 (!) 138/117  Pulse: 80 79 89 (!) 106   Resp: 10 13 (!) 24 18  Temp: 98.5 F (36.9 C)  98.9 F (37.2 C)   TempSrc: Oral  Oral   SpO2: 97% 96% 94% 98%  Weight:      Height:        Intake/Output Summary (Last 24 hours) at 12/01/2019 1452 Last data filed at 12/01/2019 1200 Gross per 24 hour  Intake 2641.88 ml  Output 6900 ml  Net -4258.12 ml   Filed Weights   11/26/19 1441  Weight: 112 kg    Exam:  GEN: No acute respiratory distress SKIN: No rash EYES: EOMI ENT: MMM CV: RRR PULM: No wheezing or rales ABD: Soft, obese, epigastric and left upper quadrant tenderness, no rebound tenderness or guarding, + BS CNS: AAO x 3, non focal EXT: No edema or tenderness   Data Reviewed:   I have personally reviewed following labs and imaging studies:  Labs: Labs show the following:   Basic Metabolic Panel: Recent Labs  Lab 11/29/19 0323 11/29/19 0323 11/29/19 1042 11/29/19 1946 11/30/19 0219 11/30/19 0219 11/30/19 0833 11/30/19 1831 12/01/19 0522 12/01/19 1402  NA 140  --  137  --  136  --  139  --  138  --   K 3.6   < > 3.7   < > 3.4*   < > 3.5   < > 3.6 3.9  CL 106  --  103  --  104  --  100  --  102  --   CO2 26  --  27  --  28  --  29  --  29  --   GLUCOSE 100*  --  77  --  99  --  88  --  77  --   BUN <5*  --  <5*  --  <5*  --  <5*  --  <5*  --   CREATININE 0.87  --  0.84  --  0.82  --  0.93  --  0.91  --   CALCIUM 8.1*  --  8.5*  --  8.3*  --  8.5*  --  8.5*  --    < > = values in this interval not displayed.   GFR Estimated Creatinine Clearance: 136.7 mL/min (by C-G formula based on SCr of 0.91 mg/dL). Liver Function Tests: Recent Labs  Lab 11/26/19 1443 11/27/19 0407  AST 34 28  ALT 41 33  ALKPHOS 50 38  BILITOT 2.6* 0.4  PROT 5.9* 5.7*  ALBUMIN 4.8 4.0   Recent Labs  Lab 11/27/19 0407 11/28/19 0218 11/29/19 0323 11/30/19 0219 12/01/19 0522  LIPASE 56* 24 18 22 20    No results for input(s): AMMONIA in the last 168 hours. Coagulation profile No results for input(s): INR, PROTIME  in the last 168 hours.  CBC: Recent Labs  Lab 11/26/19 1443 11/27/19 0407  WBC 5.7 7.5  HGB 15.9 14.0  HCT 45.7 40.9  MCV 80.6 80.8  PLT 223 202   Cardiac Enzymes: No results for input(s): CKTOTAL, CKMB, CKMBINDEX, TROPONINI in the last 168 hours. BNP (last 3 results) No results for input(s): PROBNP in the last 8760 hours. CBG: Recent Labs  Lab 12/01/19 0305 12/01/19 0415 12/01/19 0810 12/01/19 0853 12/01/19 1132  GLUCAP 135* 119* 65* 100* 205*   D-Dimer: No results for input(s): DDIMER in the last 72 hours. Hgb A1c: No results for input(s): HGBA1C in the last 72 hours. Lipid Profile: Recent Labs    11/30/19 0219 12/01/19 0522  TRIG 1,085* 1,185*   Thyroid function studies: No results for input(s): TSH, T4TOTAL, T3FREE, THYROIDAB in the last 72 hours.  Invalid input(s): FREET3 Anemia work up: No results for input(s): VITAMINB12, FOLATE, FERRITIN, TIBC, IRON, RETICCTPCT in the last 72 hours. Sepsis Labs: Recent Labs  Lab 11/26/19 1443 11/27/19 0407  WBC 5.7 7.5    Microbiology Recent Results (from the past 240 hour(s))  MRSA PCR Screening     Status: None   Collection Time: 11/26/19 11:34 PM   Specimen: Nasopharyngeal  Result Value Ref Range Status   MRSA by PCR NEGATIVE NEGATIVE Final    Comment:        The GeneXpert MRSA Assay (FDA approved for NASAL specimens only), is one component of a comprehensive MRSA colonization surveillance program. It is not intended to diagnose MRSA infection nor to guide or monitor treatment for MRSA infections. Performed at [facility], [address] [place], [place] [address]     Procedures and diagnostic studies:  No results found.  Medications:   . Chlorhexidine Gluconate Cloth  6 each Topical Daily  . docusate sodium  100 mg Oral BID  . enoxaparin (LOVENOX) injection  40 mg Subcutaneous Q24H  . gemfibrozil  600 mg Oral BID AC  . lipase/protease/amylase  180,000 Units Oral TID AC  .  lipase/protease/amylase  72,000 Units Oral With snacks  . omega-3 acid ethyl esters  3 g Oral BID  . pregabalin  300 mg Oral QHS  . rosuvastatin  10 mg Oral q1800  . sodium chloride flush  3 mL Intravenous Once  . tamsulosin  0.4 mg Oral Daily   Continuous Infusions: . dextrose  100 mL/hr (12/01/19 1223)  . dextrose 5 % and 0.9% NaCl Stopped (12/01/19 1020)  . insulin 0.2 Units/kg/hr (12/01/19 1258)     LOS: 5 days   BERNARD AYIKU  [facility]     12/01/2019, 2:52 PM

## 2019-12-02 LAB — BASIC METABOLIC PANEL
Anion gap: 9 (ref 5–15)
BUN: <5 mg/dL — ABNORMAL LOW (ref 6–20)
CO2: 30 mmol/L (ref 22–32)
Calcium: 8.6 mg/dL — ABNORMAL LOW (ref 8.9–10.3)
Chloride: 98 mmol/L (ref 98–111)
Creatinine, Ser: 0.87 mg/dL (ref 0.61–1.24)
GFR calc Af Amer: >60 mL/min (ref >60)
GFR calc non Af Amer: >60 mL/min (ref >60)
Glucose, Bld: 84 mg/dL (ref 70–99)
Potassium: 3.4 mmol/L — ABNORMAL LOW (ref 3.5–5.1)
Sodium: 137 mmol/L (ref 135–145)

## 2019-12-02 LAB — GLUCOSE, CAPILLARY
Glucose-Capillary: 102 mg/dL — ABNORMAL HIGH (ref 70–99)
Glucose-Capillary: 143 mg/dL — ABNORMAL HIGH (ref 70–99)
Glucose-Capillary: 148 mg/dL — ABNORMAL HIGH (ref 70–99)
Glucose-Capillary: 152 mg/dL — ABNORMAL HIGH (ref 70–99)
Glucose-Capillary: 166 mg/dL — ABNORMAL HIGH (ref 70–99)
Glucose-Capillary: 180 mg/dL — ABNORMAL HIGH (ref 70–99)
Glucose-Capillary: 211 mg/dL — ABNORMAL HIGH (ref 70–99)
Glucose-Capillary: 245 mg/dL — ABNORMAL HIGH (ref 70–99)
Glucose-Capillary: 90 mg/dL (ref 70–99)

## 2019-12-02 LAB — TRIGLYCERIDES: Triglycerides: 1165 mg/dL — ABNORMAL HIGH (ref <150)

## 2019-12-02 LAB — MAGNESIUM: Magnesium: 1.9 mg/dL (ref 1.7–2.4)

## 2019-12-02 MED ORDER — POTASSIUM CHLORIDE CRYS ER 10 MEQ PO TBCR
20.0000 meq | EXTENDED_RELEASE_TABLET | Freq: Once | ORAL | Status: DC
  Filled 2019-12-02 (×2): qty 2

## 2019-12-02 MED ORDER — POTASSIUM CHLORIDE CRYS ER 20 MEQ PO TBCR
40.0000 meq | EXTENDED_RELEASE_TABLET | Freq: Once | ORAL | Status: AC
  Administered 2019-12-02: 20 meq via ORAL

## 2019-12-02 NOTE — Progress Notes (Signed)
Progress Note    Sergio MCCLENDON  [id number] DOB: 06-22-1976  DOA: 11/26/2019 PCP: System, Pcp Not In      Brief Narrative:    Medical records reviewed and are as summarized below:       Assessment/Plan:   Principal Problem:   Acute pancreatitis Active Problems:   Hypertriglyceridemia   Acute pancreatitis/recurrent pancreatitis due to severe hypertriglyceridemia: Triglycerides was greater than 5,000 on admission.  Triglycerides went from 1,185 to 1,165 p.m. (not changed much).  Lipase has normalized.  Continue IV insulin infusion for hypertriglyceridemia.  Continue IV morphine and oxycodone as needed for pain.  Antiemetics as needed for nausea.  Continue pancrelipase, Lovaza, gemfibrozil and rosuvastatin.  Advance diet as tolerated.  Patient understands that he will likely be discharged home tomorrow if he improves.  Type 2 diabetes mellitus: Glucose level is controlled but he's still on insulin drip because of hypertriglyceridemia.  He developed hypoglycemia with glucose of 65 this morning. He is also on IV dextrose infusion to prevent hypoglycemia (10% dextrose infusion preferred because of risk of hypoglycemia).  Hypokalemia: Replete potassium  Peripheral neuropathy: Continue Lyrica  BPH: He is on Flomax  Anxiety depression: Continue Xanax and trazodone.  Obstructive sleep apnea: Continue CPAP at night   Body mass index is 33.5 kg/m.  (Morbid obesity)   Family Communication/Anticipated D/C date and plan/Code Status   DVT prophylaxis: Lovenox Code Status: Full code Family Communication: Plan discussed with patient Disposition Plan: Patient is from home.  Possible discharge to home tomorrow when abdominal pain and nausea improve and patient is able to tolerate a diet.     Subjective:   Nausea is a little better today.  Even though he is still has abdominal pain it's also better than it was yesterday.  He said he was able to eat a banana this  morning.  Objective:    Vitals:   12/02/19 1000 12/02/19 1100 12/02/19 1200 12/02/19 1400  BP: 106/73  105/68 113/75  Pulse: 86  83 91  Resp: 13  11 10   Temp:    98.8 F (37.1 C)  TempSrc:    Axillary  SpO2: 95% 97% 96% 96%  Weight:      Height:        Intake/Output Summary (Last 24 hours) at 12/02/2019 1428 Last data filed at 12/02/2019 1100 Gross per 24 hour  Intake 1689.44 ml  Output 6700 ml  Net -5010.56 ml   Filed Weights   11/26/19 1441  Weight: 112 kg    Exam:  GEN: No acute respiratory distress SKIN: No rash EYES: No pallor or icterus ENT: MMM CV: RRR PULM: Clear to auscultation bilaterally. ABD: Soft, obese, epigastric and left upper quadrant tenderness, no rebound tenderness or guarding, + BS CNS: AAO x 3, non focal EXT: No edema or tenderness   Data Reviewed:   I have personally reviewed following labs and imaging studies:  Labs: Labs show the following:   Basic Metabolic Panel: Recent Labs  Lab 11/29/19 1042 11/29/19 1946 11/30/19 0219 11/30/19 0219 11/30/19 0833 11/30/19 1831 12/01/19 0522 12/01/19 0522 12/01/19 1402 12/02/19 0550  NA 137  --  136  --  139  --  138  --   --  137  K 3.7   < > 3.4*   < > 3.5   < > 3.6   < > 3.9 3.4*  CL 103  --  104  --  100  --  102  --   --  98  CO2 27  --  28  --  29  --  29  --   --  30  GLUCOSE 77  --  99  --  88  --  77  --   --  84  BUN <5*  --  <5*  --  <5*  --  <5*  --   --  <5*  CREATININE 0.84  --  0.82  --  0.93  --  0.91  --   --  0.87  CALCIUM 8.5*  --  8.3*  --  8.5*  --  8.5*  --   --  8.6*  MG  --   --   --   --   --   --   --   --   --  1.9   < > = values in this interval not displayed.   GFR Estimated Creatinine Clearance: 143 mL/min (by C-G formula based on SCr of 0.87 mg/dL). Liver Function Tests: Recent Labs  Lab 11/26/19 1443 11/27/19 0407  AST 34 28  ALT 41 33  ALKPHOS 50 38  BILITOT 2.6* 0.4  PROT 5.9* 5.7*  ALBUMIN 4.8 4.0   Recent Labs  Lab 11/27/19 0407  11/28/19 0218 11/29/19 0323 11/30/19 0219 12/01/19 0522  LIPASE 56* 24 18 22 20    No results for input(s): AMMONIA in the last 168 hours. Coagulation profile No results for input(s): INR, PROTIME in the last 168 hours.  CBC: Recent Labs  Lab 11/26/19 1443 11/27/19 0407  WBC 5.7 7.5  HGB 15.9 14.0  HCT 45.7 40.9  MCV 80.6 80.8  PLT 223 202   Cardiac Enzymes: No results for input(s): CKTOTAL, CKMB, CKMBINDEX, TROPONINI in the last 168 hours. BNP (last 3 results) No results for input(s): PROBNP in the last 8760 hours. CBG: Recent Labs  Lab 12/02/19 0013 12/02/19 0413 12/02/19 0809 12/02/19 1003 12/02/19 1149  GLUCAP 152* 102* 90 148* 143*   D-Dimer: No results for input(s): DDIMER in the last 72 hours. Hgb A1c: No results for input(s): HGBA1C in the last 72 hours. Lipid Profile: Recent Labs    12/01/19 0522 12/02/19 0550  TRIG 1,185* 1,165*   Thyroid function studies: No results for input(s): TSH, T4TOTAL, T3FREE, THYROIDAB in the last 72 hours.  Invalid input(s): FREET3 Anemia work up: No results for input(s): VITAMINB12, FOLATE, FERRITIN, TIBC, IRON, RETICCTPCT in the last 72 hours. Sepsis Labs: Recent Labs  Lab 11/26/19 1443 11/27/19 0407  WBC 5.7 7.5    Microbiology Recent Results (from the past 240 hour(s))  MRSA PCR Screening     Status: None   Collection Time: 11/26/19 11:34 PM   Specimen: Nasopharyngeal  Result Value Ref Range Status   MRSA by PCR NEGATIVE NEGATIVE Final    Comment:        The GeneXpert MRSA Assay (FDA approved for NASAL specimens only), is one component of a comprehensive MRSA colonization surveillance program. It is not intended to diagnose MRSA infection nor to guide or monitor treatment for MRSA infections. Performed at [facility], [address] [place],  [address]     Procedures and diagnostic studies:  No results found.  Medications:   . Chlorhexidine Gluconate Cloth  6 each  Topical Daily  . docusate sodium  100 mg Oral BID  . enoxaparin (LOVENOX) injection  40 mg Subcutaneous Q24H  . gemfibrozil  600 mg Oral BID AC  . lipase/protease/amylase  180,000 Units Oral TID AC  .  lipase/protease/amylase  72,000 Units Oral With snacks  . omega-3 acid ethyl esters  3 g Oral BID  . pregabalin  300 mg Oral QHS  . rosuvastatin  10 mg Oral q1800  . sodium chloride flush  3 mL Intravenous Once  . tamsulosin  0.4 mg Oral Daily   Continuous Infusions: . dextrose 125 mL/hr (12/02/19 0811)  . insulin 0.2 Units/kg/hr (12/02/19 0819)     LOS: 6 days   Areen Trautner  [facility]     12/02/2019, 2:28 PM

## 2019-12-02 NOTE — Progress Notes (Signed)
[facility] for Electrolyte Monitoring and Replacement   Recent Labs: Potassium (mmol/L)  Date Value  12/02/2019 3.4 (L)  11/04/2014 3.6   Magnesium (mg/dL)  Date Value  06/19/2019 1.9  03/18/2014 1.9   Calcium (mg/dL)  Date Value  12/02/2019 8.6 (L)   Calcium, Total (mg/dL)  Date Value  11/04/2014 8.5   Albumin (g/dL)  Date Value  11/27/2019 4.0  11/01/2014 3.0 (L)   Phosphorus (mg/dL)  Date Value  01/21/2018 5.1 (H)   Sodium  Date Value  12/02/2019 137 mmol/L  04/12/2018 129 (A)  11/04/2014 137 mmol/L   Assessment: 44 year old male admitted for hypertriglyceridemia. Patient started on insulin drip at 0.2 units/kg/hr. Pharmacy consulted to manage electrolytes.  Potassium mostly maintaining between 3.5-4.0 with patient receiving supplementation fairly regularly. Trying to check electrolytes approximately q6h. Will need to follow closely while on insulin drip. Continue D10NS to accommodate insulin drip.  -2/19 @ 1831: K 3.8, will give PO potassium chloride 40 mEq x 1 and re-check electrolytes tomorrow AM -2/20 0522  K 3.6   TG 1185. Insulin drip at 22.4 ml/hr. On D10 IVF also Will order KCL PO 40 meq x2 doses Will f/u K ~1400 and with am labs  Goal of Therapy:  Electrolytes WNL, potassium > 4 while on insulin drip  Plan: 2/22 K 3.4 Scr 0.87.  Insulin drip at 22.4 ml/hr + D10W IVF.  * KCL ordered yesterday was discontinued by MD -will order KCL PO 20 meq x1   Chinita Greenland PharmD Clinical Pharmacist 12/02/2019

## 2019-12-03 LAB — BASIC METABOLIC PANEL
Anion gap: 7 (ref 5–15)
BUN: 6 mg/dL (ref 6–20)
CO2: 29 mmol/L (ref 22–32)
Calcium: 8.6 mg/dL — ABNORMAL LOW (ref 8.9–10.3)
Chloride: 101 mmol/L (ref 98–111)
Creatinine, Ser: 0.88 mg/dL (ref 0.61–1.24)
GFR calc Af Amer: >60 mL/min (ref >60)
GFR calc non Af Amer: >60 mL/min (ref >60)
Glucose, Bld: 130 mg/dL — ABNORMAL HIGH (ref 70–99)
Potassium: 3.6 mmol/L (ref 3.5–5.1)
Sodium: 137 mmol/L (ref 135–145)

## 2019-12-03 LAB — GLUCOSE, CAPILLARY
Glucose-Capillary: 101 mg/dL — ABNORMAL HIGH (ref 70–99)
Glucose-Capillary: 135 mg/dL — ABNORMAL HIGH (ref 70–99)
Glucose-Capillary: 147 mg/dL — ABNORMAL HIGH (ref 70–99)
Glucose-Capillary: 155 mg/dL — ABNORMAL HIGH (ref 70–99)
Glucose-Capillary: 197 mg/dL — ABNORMAL HIGH (ref 70–99)
Glucose-Capillary: 83 mg/dL (ref 70–99)

## 2019-12-03 LAB — TRIGLYCERIDES: Triglycerides: 881 mg/dL — ABNORMAL HIGH (ref <150)

## 2019-12-03 MED ORDER — OXYCODONE HCL 5 MG PO TABS: 5.0000 mg | ORAL_TABLET | Freq: Four times a day (QID) | ORAL | 0 refills | Status: DC | PRN

## 2019-12-03 NOTE — Discharge Instructions (Signed)
Follow up with your PCP/Endocrinologist in 1-2 weeks.

## 2019-12-03 NOTE — Progress Notes (Signed)
Patient discharged from hospital at this time.  Wife picked patient up at the front of the hospital.

## 2019-12-03 NOTE — Discharge Summary (Signed)
Physician Discharge Summary  Sergio [name] [id number] DOB: [birth year] DOA: 11/26/2019  PCP: System, Pcp Not In  Admit date: 11/26/2019 Discharge date: 12/03/2019  Discharge disposition: Home   Recommendations for Outpatient Follow-Up:   Follow up with PCP/endocrinologist in 1 to 2 weeks   Discharge Diagnosis:   Principal Problem:   Acute pancreatitis Active Problems:   Hypertriglyceridemia    Discharge Condition: Stable.  Diet recommendation: low fat, low sugar diet  Code status: Full code.    Hospital Course:   Mr. Sergio [name] is a 44 year old man with medical history significant for familial hypertriglyceridemia, recurrent acute pancreatitis, type 2 diabetes mellitus, sleep apnea, depression and anxiety.  He sees Dr. Gabriel Carina, endocrinologist, as an outpatient where he gets IV insulin infusion for severe hypertriglyceridemia about every 2 weeks.  He presented to the emergency room with acute onset of epigastric abdominal pain with extension to both left upper and right upper quadrants with radiation through his back.  This was associated with nausea and vomiting.  He was diagnosed with acute bronchitis.  His lipase was 478 and triglyceride level was greater than 5,000.  He was presented with IV narcotics, IV fluids, antiemetics and IV insulin infusion.  His symptoms slowly improved.  Lipase level normalized and triglycerides went down to 881.  Symptoms improved and he was able to tolerate a diet.  He was deemed stable for discharge to home       Discharge Exam:   Vitals:   12/03/19 0805 12/03/19 0900  BP: 126/88   Pulse: 91 93  Resp: 17 16  Temp:    SpO2: 96% 95%   Vitals:   12/03/19 0700 12/03/19 0800 12/03/19 0805 12/03/19 0900  BP:   126/88   Pulse: 77 86 91 93  Resp: 11 14 17 16   Temp:  98.2 F (36.8 C)    TempSrc:  Oral    SpO2: 92% 93% 96% 95%  Weight:      Height:         GEN: NAD SKIN: No rash EYES: EOMI ENT: MMM CV: RRR PULM: CTA  B ABD: soft, obese, NT, +BS CNS: AAO x 3, non focal EXT: No edema or tenderness   The results of significant diagnostics from this hospitalization (including imaging, microbiology, ancillary and laboratory) are listed below for reference.     Procedures and Diagnostic Studies:   US ABDOMEN LIMITED RUQ  Result Date: 11/26/2019 CLINICAL DATA:  Epigastric pain for 3 days EXAM: ULTRASOUND ABDOMEN LIMITED RIGHT UPPER QUADRANT COMPARISON:  01/15/2018, 06/15/2019 FINDINGS: Gallbladder: Cholecystectomy Common bile duct: Diameter: 4 mm Liver: Diffuse increased liver echotexture again noted consistent with fibrofatty infiltration. No focal abnormalities or intrahepatic biliary duct dilation. Portal vein is patent on color Doppler imaging with normal direction of blood flow towards the liver. Other: None. IMPRESSION: 1. Stable fatty infiltration of the liver. 2. Cholecystectomy. Electronically Signed   By: Randa Ngo M.D.   On: 11/26/2019 18:51     Labs:   Basic Metabolic Panel: Recent Labs  Lab 11/30/19 0219 11/30/19 0219 11/30/19 0833 11/30/19 1831 12/01/19 0522 12/01/19 1402 12/02/19 0550 12/03/19 0448  NA 136  --  139  --  138  --  137 137  K 3.4*   < > 3.5   < > 3.6   < > 3.4* 3.6  CL 104  --  100  --  102  --  98 101  CO2 28  --  29  --  29  --  30 29  GLUCOSE 99  --  88  --  77  --  84 130*  BUN <5*  --  <5*  --  <5*  --  <5* 6  CREATININE 0.82  --  0.93  --  0.91  --  0.87 0.88  CALCIUM 8.3*  --  8.5*  --  8.5*  --  8.6* 8.6*  MG  --   --   --   --   --   --  1.9  --    < > = values in this interval not displayed.   GFR Estimated Creatinine Clearance: 141.4 mL/min (by C-G formula based on SCr of 0.88 mg/dL). Liver Function Tests: Recent Labs  Lab 11/26/19 1443 11/27/19 0407  AST 34 28  ALT 41 33  ALKPHOS 50 38  BILITOT 2.6* 0.4  PROT 5.9* 5.7*  ALBUMIN 4.8 4.0   Recent Labs  Lab 11/27/19 0407 11/28/19 0218 11/29/19 0323 11/30/19 0219 12/01/19 0522   LIPASE 56* 24 18 22 20    No results for input(s): AMMONIA in the last 168 hours. Coagulation profile No results for input(s): INR, PROTIME in the last 168 hours.  CBC: Recent Labs  Lab 11/26/19 1443 11/27/19 0407  WBC 5.7 7.5  HGB 15.9 14.0  HCT 45.7 40.9  MCV 80.6 80.8  PLT 223 202   Cardiac Enzymes: No results for input(s): CKTOTAL, CKMB, CKMBINDEX, TROPONINI in the last 168 hours. BNP: Invalid input(s): POCBNP CBG: Recent Labs  Lab 12/03/19 0157 12/03/19 0355 12/03/19 0601 12/03/19 0759 12/03/19 0958  GLUCAP 147* 135* 101* 83 155*   D-Dimer No results for input(s): DDIMER in the last 72 hours. Hgb A1c No results for input(s): HGBA1C in the last 72 hours. Lipid Profile Recent Labs    12/02/19 0550 12/03/19 0448  TRIG 1,165* 881*   Thyroid function studies No results for input(s): TSH, T4TOTAL, T3FREE, THYROIDAB in the last 72 hours.  Invalid input(s): FREET3 Anemia work up No results for input(s): VITAMINB12, FOLATE, FERRITIN, TIBC, IRON, RETICCTPCT in the last 72 hours. Microbiology Recent Results (from the past 240 hour(s))  MRSA PCR Screening     Status: None   Collection Time: 11/26/19 11:34 PM   Specimen: Nasopharyngeal  Result Value Ref Range Status   MRSA by PCR NEGATIVE NEGATIVE Final    Comment:        The GeneXpert MRSA Assay (FDA approved for NASAL specimens only), is one component of a comprehensive MRSA colonization surveillance program. It is not intended to diagnose MRSA infection nor to guide or monitor treatment for MRSA infections. Performed at [facility], [address], [place], [place] [address]      Discharge Instructions:   Discharge Instructions    Diet Carb Modified   Complete by: As directed    Increase activity slowly   Complete by: As directed      Allergies as of 12/03/2019      Reactions   Codeine Anaphylaxis   Iodinated Diagnostic Agents Other (See Comments), Anaphylaxis   Kidneys stop  working KIDNEYS SHUT DOWN      Medication List    TAKE these medications   acetaminophen 325 MG tablet Commonly known as: TYLENOL Take 2 tablets (650 mg total) by mouth every 6 (six) hours as needed for mild pain (or Fever >/= 101).   ALPRAZolam 0.5 MG tablet Commonly known as: XANAX TAKE 1 TABLET BY MOUTH TWICE DAILY AS NEEDED FOR ANXIETY What changed:   reasons  to take this  additional instructions   gemfibrozil 600 MG tablet Commonly known as: LOPID Take 1 tablet (600 mg total) by mouth 2 (two) times daily before a meal.   NovoLOG 100 UNIT/ML injection Generic drug: insulin aspart Inject 1 Dose into the skin See admin instructions. Per sliding scale   omega-3 acid ethyl esters 1 g capsule Commonly known as: LOVAZA Take 3 capsules (3 g total) by mouth 2 (two) times daily.   omeprazole 20 MG capsule Commonly known as: PRILOSEC Take 20 mg by mouth daily at 6 (six) AM.   oxyCODONE 5 MG immediate release tablet Commonly known as: Roxicodone Take 1 tablet (5 mg total) by mouth every 6 (six) hours as needed for moderate pain. What changed: reasons to take this   Pancrelipase (Lip-Prot-Amyl) 40000-126000 units Cpep Commonly known as: Zenpep Take 1 capsule by mouth 5 (five) times daily. Before meals and  snacks What changed:   how much to take  when to take this  additional instructions   pregabalin 300 MG capsule Commonly known as: LYRICA Take 300 mg by mouth at bedtime.   rosuvastatin 10 MG tablet Commonly known as: CRESTOR Take 1 tablet (10 mg total) by mouth daily at 6 PM.   tamsulosin 0.4 MG Caps capsule Commonly known as: FLOMAX Take 1 capsule (0.4 mg total) by mouth daily.   Toujeo Max SoloStar 300 UNIT/ML Sopn Generic drug: Insulin Glargine (2 Unit Dial) Inject 90 Units into the skin at bedtime.   traZODone 100 MG tablet Commonly known as: DESYREL Take 2 tablets (200 mg total) by mouth daily as needed for sleep. What changed:   how much to  take  when to take this   Trulicity 1.5 0000000 Sopn Generic drug: Dulaglutide Inject 1.5 mg into the skin every Monday.         Time coordinating discharge: 28 minutes  Signed:  Yaritsa Savarino  [facility] 12/03/2019, 10:29 AM

## 2021-10-19 DIAGNOSIS — J301 Allergic rhinitis due to pollen: Secondary | ICD-10-CM | POA: Diagnosis not present

## 2021-10-20 DIAGNOSIS — Z794 Long term (current) use of insulin: Secondary | ICD-10-CM | POA: Diagnosis not present

## 2021-10-20 DIAGNOSIS — Z9641 Presence of insulin pump (external) (internal): Secondary | ICD-10-CM | POA: Diagnosis not present

## 2021-10-20 DIAGNOSIS — E1129 Type 2 diabetes mellitus with other diabetic kidney complication: Secondary | ICD-10-CM | POA: Diagnosis not present

## 2021-10-20 DIAGNOSIS — E781 Pure hyperglyceridemia: Secondary | ICD-10-CM | POA: Diagnosis not present

## 2021-10-20 DIAGNOSIS — E1142 Type 2 diabetes mellitus with diabetic polyneuropathy: Secondary | ICD-10-CM | POA: Diagnosis not present

## 2021-10-20 DIAGNOSIS — R809 Proteinuria, unspecified: Secondary | ICD-10-CM | POA: Diagnosis not present

## 2021-10-21 DIAGNOSIS — Z08 Encounter for follow-up examination after completed treatment for malignant neoplasm: Secondary | ICD-10-CM | POA: Diagnosis not present

## 2021-10-21 DIAGNOSIS — B360 Pityriasis versicolor: Secondary | ICD-10-CM | POA: Diagnosis not present

## 2021-10-21 DIAGNOSIS — Z09 Encounter for follow-up examination after completed treatment for conditions other than malignant neoplasm: Secondary | ICD-10-CM | POA: Diagnosis not present

## 2021-10-21 DIAGNOSIS — Z85828 Personal history of other malignant neoplasm of skin: Secondary | ICD-10-CM | POA: Diagnosis not present

## 2021-10-21 DIAGNOSIS — D485 Neoplasm of uncertain behavior of skin: Secondary | ICD-10-CM | POA: Diagnosis not present

## 2021-10-21 DIAGNOSIS — Z872 Personal history of diseases of the skin and subcutaneous tissue: Secondary | ICD-10-CM | POA: Diagnosis not present

## 2021-11-05 DIAGNOSIS — J301 Allergic rhinitis due to pollen: Secondary | ICD-10-CM | POA: Diagnosis not present

## 2021-11-09 DIAGNOSIS — J301 Allergic rhinitis due to pollen: Secondary | ICD-10-CM | POA: Diagnosis not present

## 2021-11-30 DIAGNOSIS — D034 Melanoma in situ of scalp and neck: Secondary | ICD-10-CM | POA: Diagnosis not present

## 2021-11-30 DIAGNOSIS — J301 Allergic rhinitis due to pollen: Secondary | ICD-10-CM | POA: Diagnosis not present

## 2021-12-07 DIAGNOSIS — J301 Allergic rhinitis due to pollen: Secondary | ICD-10-CM | POA: Diagnosis not present

## 2021-12-14 DIAGNOSIS — J301 Allergic rhinitis due to pollen: Secondary | ICD-10-CM | POA: Diagnosis not present

## 2021-12-19 ENCOUNTER — Inpatient Hospital Stay
Admission: EM | Admit: 2021-12-19 | Discharge: 2021-12-29 | DRG: 439 | Disposition: A | Discharge status: Home / Self Care | Payer: 59 | Attending: Internal Medicine | Admitting: Internal Medicine

## 2021-12-19 ENCOUNTER — Encounter: Payer: Self-pay | Admitting: Emergency Medicine

## 2021-12-19 ENCOUNTER — Emergency Department: Payer: 59

## 2021-12-19 ENCOUNTER — Other Ambulatory Visit: Payer: Self-pay

## 2021-12-19 DIAGNOSIS — R7401 Elevation of levels of liver transaminase levels: Secondary | ICD-10-CM

## 2021-12-19 DIAGNOSIS — Z9884 Bariatric surgery status: Secondary | ICD-10-CM

## 2021-12-19 DIAGNOSIS — F419 Anxiety disorder, unspecified: Secondary | ICD-10-CM | POA: Diagnosis not present

## 2021-12-19 DIAGNOSIS — E781 Pure hyperglyceridemia: Principal | ICD-10-CM | POA: Diagnosis present

## 2021-12-19 DIAGNOSIS — F32A Depression, unspecified: Secondary | ICD-10-CM | POA: Diagnosis not present

## 2021-12-19 DIAGNOSIS — Z20822 Contact with and (suspected) exposure to covid-19: Secondary | ICD-10-CM | POA: Diagnosis not present

## 2021-12-19 DIAGNOSIS — E1149 Type 2 diabetes mellitus with other diabetic neurological complication: Secondary | ICD-10-CM

## 2021-12-19 DIAGNOSIS — I10 Essential (primary) hypertension: Secondary | ICD-10-CM | POA: Diagnosis not present

## 2021-12-19 DIAGNOSIS — Z7985 Long-term (current) use of injectable non-insulin antidiabetic drugs: Secondary | ICD-10-CM | POA: Diagnosis not present

## 2021-12-19 DIAGNOSIS — E876 Hypokalemia: Secondary | ICD-10-CM | POA: Diagnosis not present

## 2021-12-19 DIAGNOSIS — Z91041 Radiographic dye allergy status: Secondary | ICD-10-CM

## 2021-12-19 DIAGNOSIS — Z6836 Body mass index (BMI) 36.0-36.9, adult: Secondary | ICD-10-CM

## 2021-12-19 DIAGNOSIS — E1165 Type 2 diabetes mellitus with hyperglycemia: Secondary | ICD-10-CM | POA: Diagnosis present

## 2021-12-19 DIAGNOSIS — Z9852 Vasectomy status: Secondary | ICD-10-CM | POA: Diagnosis not present

## 2021-12-19 DIAGNOSIS — K859 Acute pancreatitis without necrosis or infection, unspecified: Secondary | ICD-10-CM | POA: Diagnosis not present

## 2021-12-19 DIAGNOSIS — K8689 Other specified diseases of pancreas: Secondary | ICD-10-CM

## 2021-12-19 DIAGNOSIS — Z85828 Personal history of other malignant neoplasm of skin: Secondary | ICD-10-CM | POA: Diagnosis not present

## 2021-12-19 DIAGNOSIS — Z87892 Personal history of anaphylaxis: Secondary | ICD-10-CM

## 2021-12-19 DIAGNOSIS — E871 Hypo-osmolality and hyponatremia: Secondary | ICD-10-CM | POA: Diagnosis not present

## 2021-12-19 DIAGNOSIS — R109 Unspecified abdominal pain: Secondary | ICD-10-CM | POA: Diagnosis not present

## 2021-12-19 DIAGNOSIS — Z8711 Personal history of peptic ulcer disease: Secondary | ICD-10-CM | POA: Diagnosis not present

## 2021-12-19 DIAGNOSIS — Z9641 Presence of insulin pump (external) (internal): Secondary | ICD-10-CM | POA: Diagnosis present

## 2021-12-19 DIAGNOSIS — E669 Obesity, unspecified: Secondary | ICD-10-CM

## 2021-12-19 DIAGNOSIS — R339 Retention of urine, unspecified: Secondary | ICD-10-CM | POA: Diagnosis not present

## 2021-12-19 DIAGNOSIS — Z8249 Family history of ischemic heart disease and other diseases of the circulatory system: Secondary | ICD-10-CM

## 2021-12-19 DIAGNOSIS — E860 Dehydration: Secondary | ICD-10-CM | POA: Diagnosis not present

## 2021-12-19 DIAGNOSIS — G4733 Obstructive sleep apnea (adult) (pediatric): Secondary | ICD-10-CM

## 2021-12-19 DIAGNOSIS — R112 Nausea with vomiting, unspecified: Secondary | ICD-10-CM

## 2021-12-19 DIAGNOSIS — Z79899 Other long term (current) drug therapy: Secondary | ICD-10-CM

## 2021-12-19 DIAGNOSIS — Z885 Allergy status to narcotic agent status: Secondary | ICD-10-CM

## 2021-12-19 DIAGNOSIS — R739 Hyperglycemia, unspecified: Secondary | ICD-10-CM

## 2021-12-19 DIAGNOSIS — G4730 Sleep apnea, unspecified: Secondary | ICD-10-CM | POA: Diagnosis present

## 2021-12-19 DIAGNOSIS — Z794 Long term (current) use of insulin: Secondary | ICD-10-CM | POA: Diagnosis not present

## 2021-12-19 DIAGNOSIS — Z83438 Family history of other disorder of lipoprotein metabolism and other lipidemia: Secondary | ICD-10-CM

## 2021-12-19 HISTORY — DX: Unspecified malignant neoplasm of skin, unspecified: C44.90

## 2021-12-19 LAB — CBC WITH DIFFERENTIAL/PLATELET
Abs Immature Granulocytes: 0.02 10*3/uL (ref 0.00–0.07)
Basophils Absolute: 0.1 10*3/uL (ref 0.0–0.1)
Basophils Relative: 1 %
Eosinophils Absolute: 0.1 10*3/uL (ref 0.0–0.5)
Eosinophils Relative: 1 %
HCT: 47.8 % (ref 39.0–52.0)
Hemoglobin: 16.4 g/dL (ref 13.0–17.0)
Immature Granulocytes: 0 %
Lymphocytes Relative: 34 %
Lymphs Abs: 2 10*3/uL (ref 0.7–4.0)
MCH: 29.9 pg (ref 26.0–34.0)
MCHC: 34.3 g/dL (ref 30.0–36.0)
MCV: 87.2 fL (ref 80.0–100.0)
Monocytes Absolute: 0.3 10*3/uL (ref 0.1–1.0)
Monocytes Relative: 5 %
Neutro Abs: 3.4 10*3/uL (ref 1.7–7.7)
Neutrophils Relative %: 59 %
Platelets: 242 10*3/uL (ref 150–400)
RBC: 5.48 MIL/uL (ref 4.22–5.81)
RDW: 15.9 % — ABNORMAL HIGH (ref 11.5–15.5)
WBC: 5.8 10*3/uL (ref 4.0–10.5)
nRBC: 0.3 % — ABNORMAL HIGH (ref 0.0–0.2)

## 2021-12-19 LAB — RESP PANEL BY RT-PCR (FLU A&B, COVID) ARPGX2
Influenza A by PCR: NEGATIVE
Influenza B by PCR: NEGATIVE
SARS Coronavirus 2 by RT PCR: NEGATIVE

## 2021-12-19 LAB — LIPID PANEL
Cholesterol: 698 mg/dL — ABNORMAL HIGH (ref 0–200)
LDL Cholesterol: UNDETERMINED mg/dL (ref 0–99)
Triglycerides: >5000 mg/dL — ABNORMAL HIGH (ref <150)
VLDL: UNDETERMINED mg/dL (ref 0–40)

## 2021-12-19 LAB — CBG MONITORING, ED: Glucose-Capillary: 282 mg/dL — ABNORMAL HIGH (ref 70–99)

## 2021-12-19 LAB — TSH: TSH: 1.258 u[IU]/mL (ref 0.350–4.500)

## 2021-12-19 MED ORDER — DEXTROSE-NACL 5-0.9 % IV SOLN: INTRAVENOUS | Status: DC

## 2021-12-19 MED ORDER — ONDANSETRON HCL 4 MG/2ML IJ SOLN
4.0000 mg | Freq: Four times a day (QID) | INTRAMUSCULAR | Status: DC | PRN
  Administered 2021-12-19 – 2021-12-28 (×8): 4 mg via INTRAVENOUS
  Filled 2021-12-19 (×8): qty 2

## 2021-12-19 MED ORDER — INSULIN (MYXREDLIN) INFUSION FOR HYPERTRIGLYCERIDEMIA
0.2000 [IU]/kg/h | INTRAVENOUS | Status: DC
  Administered 2021-12-19: 23:00:00 0.1 [IU]/kg/h via INTRAVENOUS
  Administered 2021-12-20 – 2021-12-26 (×30): 0.2 [IU]/kg/h via INTRAVENOUS
  Filled 2021-12-19 (×34): qty 100

## 2021-12-19 MED ORDER — TAMSULOSIN HCL 0.4 MG PO CAPS
0.4000 mg | ORAL_CAPSULE | Freq: Every day | ORAL | Status: DC
  Administered 2021-12-19 – 2021-12-29 (×11): 0.4 mg via ORAL
  Filled 2021-12-19 (×11): qty 1

## 2021-12-19 MED ORDER — DEXTROSE 5 % IV SOLN: INTRAVENOUS | Status: DC

## 2021-12-19 MED ORDER — LACTATED RINGERS IV BOLUS
1000.0000 mL | Freq: Once | INTRAVENOUS | Status: AC
  Administered 2021-12-19: 1000 mL via INTRAVENOUS

## 2021-12-19 MED ORDER — HYDROMORPHONE HCL 1 MG/ML IJ SOLN
2.0000 mg | INTRAMUSCULAR | Status: DC | PRN
  Administered 2021-12-19 – 2021-12-21 (×9): 2 mg via INTRAVENOUS
  Filled 2021-12-19 (×9): qty 2

## 2021-12-19 MED ORDER — HYDROMORPHONE HCL 1 MG/ML IJ SOLN
0.5000 mg | Freq: Once | INTRAMUSCULAR | Status: AC
  Administered 2021-12-19: 0.5 mg via INTRAVENOUS
  Filled 2021-12-19: qty 1

## 2021-12-19 MED ORDER — HYDROMORPHONE HCL 1 MG/ML IJ SOLN
1.0000 mg | Freq: Once | INTRAMUSCULAR | Status: AC
  Administered 2021-12-19: 1 mg via INTRAVENOUS
  Filled 2021-12-19: qty 1

## 2021-12-19 MED ORDER — INSULIN ASPART 100 UNIT/ML IJ SOLN: 100.0000 [IU] | Freq: Once | INTRAMUSCULAR | Status: DC

## 2021-12-19 MED ORDER — ONDANSETRON HCL 4 MG/2ML IJ SOLN
4.0000 mg | Freq: Once | INTRAMUSCULAR | Status: AC
  Administered 2021-12-19: 4 mg via INTRAVENOUS
  Filled 2021-12-19: qty 2

## 2021-12-19 NOTE — ED Triage Notes (Signed)
Pt to ED via POV, c/o epigastric abd pain that radiates to his back. Pt states he thinks it is a pancreatitis flare up due to his elevates triglycerides. Pt states has been taking leftover oxycodone without relief of pain.  ?

## 2021-12-19 NOTE — Assessment & Plan Note (Addendum)
Continue insulin infusion in stepdown unit. Continue dextrose infusion to prevent hypoglycemia. Trend triglycerides, likely DC once <1000. Continue endocrinology follow-up as an outpatient infusions as scheduled call if concern for needing to be seen sooner. Uses insulin pump at home. Pain control with as needed IV Dilaudid, transition to oral pain medication once. tolerating p.o. Antiemetics as needed. N.p.o. plus ice chips for now.  3/13: Triglycerides improved to 2480

## 2021-12-19 NOTE — Assessment & Plan Note (Addendum)
BP stable, mildly elevated at times.  ?Appears not on antihypertensives at home.  Monitor BP. ?

## 2021-12-19 NOTE — H&P (Signed)
History and Physical    Sergio [name] QTM:[id number] DOB: 06-25-1976 DOA: 12/19/2021  DOS: the patient was seen and examined on 12/19/2021  PCP: Peggye Form, NP   Patient coming from: Home  I have personally briefly reviewed patient's old medical records in [place]  CC: abd pain, N/V HPI: 46 year old white male with a history of diabetes type 2, on insulin pump, history of familial hypertriglyceridemia, history of recurrent pancreatitis due to hypertriglyceridemia, OSA on CPAP, hypertension presents to the ER today with 2 to 3 days of worsening abdominal pain.  Patient states that he normally gets IV insulin infusions in the endocrinology office every 2 weeks to help control his hypertriglyceridemia.  Unfortunately he is missed his last 2 office appointments due to outpatient surgery he had.  Patient knows that his triglycerides are very high.  He has been taking p.o. oxycodone 10 mg to help try to control the pain but this has not worked.  He states he started having nausea vomiting today.  He knew that it was his pancreatitis acting up again.  Started having some low-grade fevers as well.  Patient states last time he need to be admitted to hospital for his triglycerides were about 2 years ago.  EMR reviewed.  He is very consistent about going to endocrinology every 2 weeks for his outpatient infusions.  Labs showed a triglyceride greater than 5000.  Total cholesterol of 698.  Sodium 130, potassium 4.1, chloride 95, bicarb 20  Lipase 89  CT abdomen showed no acute intra-abdominal findings.  [facility] hospitalist contacted for admission.    ED Course: TG >5000. Lipase 89. CT negative. Started on IV insulin gtts.  Review of Systems:  Review of Systems  Constitutional:  Positive for fever and malaise/fatigue.  HENT: Negative.    Eyes: Negative.   Respiratory: Negative.    Cardiovascular: Negative.   Gastrointestinal:  Positive for abdominal pain, nausea and vomiting.   Genitourinary: Negative.   Musculoskeletal: Negative.   Skin: Negative.   Neurological: Negative.   Endo/Heme/Allergies: Negative.   Psychiatric/Behavioral: Negative.    All other systems reviewed and are negative.  Past Medical History:  Diagnosis Date   Anxiety    Chicken pox    Depression    Diabetes mellitus    DM2   Familial hypertriglyceridemia    severe   Gastric ulcer 2009   HTN (hypertension)    Morbid obesity ([place])    s/p bariatric sleeve surgery 01/2015   Recurrent acute pancreatitis    secondary to hypertriglyceridemia    Skin cancer    Sleep apnea 1999   uses CPAP    Past Surgical History:  Procedure Laterality Date   CHOLECYSTECTOMY     LAPAROSCOPIC GASTRIC SLEEVE RESECTION     LUMBAR [place] SURGERY  2008   TONSILLECTOMY  2003   VASECTOMY       reports that he has never smoked. He has never used smokeless tobacco. He reports that he does not drink alcohol and does not use drugs.  Allergies  Allergen Reactions   Codeine Anaphylaxis   Iodinated Contrast Media Other (See Comments) and Anaphylaxis    Kidneys stop working KIDNEYS SHUT DOWN    Family History  Adopted: Yes  Problem Relation Age of Onset   Hypertension Mother    Hyperlipidemia Mother    Hypertension Father    Hyperlipidemia Father     Prior to Admission medications   Medication Sig Start Date End Date Taking? Authorizing Provider  pregabalin (LYRICA) 300 MG capsule Take 300 mg by mouth at bedtime.    Yes [provider]  sertraline (ZOLOFT) 25 MG tablet Take 25 mg by mouth daily. 11/16/21  Yes [provider]  TRULICITY 1.5 OE/7.0JJ SOPN Inject 1.5 mg into the skin every Monday.  05/17/18  Yes [provider]  acetaminophen (TYLENOL) 325 MG tablet Take 2 tablets (650 mg total) by mouth every 6 (six) hours as needed for mild pain (or Fever >/= 101). 06/20/19   Gouru, Aruna, MD  ALPRAZolam (XANAX) 0.5 MG tablet TAKE 1 TABLET BY MOUTH TWICE DAILY AS NEEDED FOR  ANXIETY Patient not taking: Reported on 12/19/2021 09/20/18   Crecencio Mc, MD  gemfibrozil (LOPID) 600 MG tablet Take 1 tablet (600 mg total) by mouth 2 (two) times daily before a meal. Patient not taking: Reported on 12/19/2021 06/20/19   [name], MD  insulin aspart (NOVOLOG) 100 UNIT/ML injection Inject 1 Dose into the skin See admin instructions. Per sliding scale Patient not taking: Reported on 12/19/2021 09/11/15   [provider]  Insulin Glargine, 2 Unit Dial, (TOUJEO MAX SOLOSTAR) 300 UNIT/ML SOPN Inject 90 Units into the skin at bedtime. Patient not taking: Reported on 12/19/2021 06/20/19   [name], MD  omega-3 acid ethyl esters (LOVAZA) 1 g capsule Take 3 capsules (3 g total) by mouth 2 (two) times daily. Patient not taking: Reported on 12/19/2021 06/20/19   [name], MD  omeprazole (PRILOSEC) 20 MG capsule Take 20 mg by mouth daily at 6 (six) AM. Patient not taking: Reported on 12/19/2021    [provider]  oxyCODONE (ROXICODONE) 5 MG immediate release tablet Take 1 tablet (5 mg total) by mouth every 6 (six) hours as needed for moderate pain. Patient not taking: Reported on 12/19/2021 12/03/19   Jennye Boroughs, MD  Pancrelipase, Lip-Prot-Amyl, (ZENPEP) 40000-126000 units CPEP Take 1 capsule by mouth 5 (five) times daily. Before meals and  snacks Patient not taking: Reported on 12/19/2021 06/02/17   Crecencio Mc, MD  rosuvastatin (CRESTOR) 10 MG tablet Take 1 tablet (10 mg total) by mouth daily at 6 PM. Patient not taking: Reported on 12/19/2021 06/20/19   [name], MD  tamsulosin (FLOMAX) 0.4 MG CAPS capsule Take 1 capsule (0.4 mg total) by mouth daily. Patient not taking: Reported on 12/19/2021 06/21/19   [name], MD  traZODone (DESYREL) 100 MG tablet Take 2 tablets (200 mg total) by mouth daily as needed for sleep. Patient not taking: Reported on 12/19/2021 07/11/18   Crecencio Mc, MD    Physical Exam: Vitals:   12/19/21 1957 12/19/21 2010 12/19/21  2115 12/19/21 2155  BP:  (!) 148/97  (!) 147/96  Pulse: 100 96 88 92  Resp: 18 (!) 22  18  Temp:      TempSrc:      SpO2: 96% 95% 93% 94%  Weight:      Height:        Physical Exam Vitals and nursing note reviewed.  Constitutional:      General: He is not in acute distress.    Appearance: Normal appearance. He is obese. He is not ill-appearing, toxic-appearing or diaphoretic.  HENT:     Head: Normocephalic and atraumatic.     Nose: Nose normal. No rhinorrhea.  Eyes:     General: No scleral icterus. Cardiovascular:     Rate and Rhythm: Normal rate.     Pulses: Normal pulses.  Pulmonary:     Effort:  Pulmonary effort is normal. No respiratory distress.     Breath sounds: Normal breath sounds. No wheezing or rales.  Abdominal:     General: Abdomen is protuberant. Bowel sounds are normal.     Tenderness: There is abdominal tenderness in the epigastric area. There is no guarding or rebound.     Hernia: No hernia is present.  Musculoskeletal:     Right lower leg: No edema.     Left lower leg: No edema.  Skin:    General: Skin is warm and dry.     Capillary Refill: Capillary refill takes less than 2 seconds.  Neurological:     General: No focal deficit present.     Mental Status: He is alert and oriented to person, place, and time.     Labs on Admission: I have personally reviewed following labs and imaging studies  CBC: Recent Labs  Lab 12/19/21 1953  WBC 5.8  NEUTROABS 3.4  HGB 16.4  HCT 47.8  MCV 87.2  PLT 924   Basic Metabolic Panel: Recent Labs  Lab 12/19/21 1953  NA 130*  K 4.1  CL 95*  CO2 20*  GLUCOSE 294*  BUN 10  CREATININE 0.90  CALCIUM 9.4   GFR: Estimated Creatinine Clearance: 136.7 mL/min (by C-G formula based on SCr of 0.9 mg/dL). Liver Function Tests: Recent Labs  Lab 12/19/21 1953  AST 165*  ALT 182*  ALKPHOS 164*  BILITOT 1.7*  PROT 8.2*  ALBUMIN 4.1   Recent Labs  Lab 12/19/21 1953  LIPASE 89*   No results for input(s):  AMMONIA in the last 168 hours. Coagulation Profile: No results for input(s): INR, PROTIME in the last 168 hours. Cardiac Enzymes: No results for input(s): CKTOTAL, CKMB, CKMBINDEX, TROPONINI, TROPONINIHS in the last 168 hours. BNP (last 3 results) No results for input(s): PROBNP in the last 8760 hours. HbA1C: No results for input(s): HGBA1C in the last 72 hours. CBG: No results for input(s): GLUCAP in the last 168 hours. Lipid Profile: Recent Labs    12/19/21 1953  CHOL 698*  HDL NOT REPORTED DUE TO HIGH TRIGLYCERIDES  LDLCALC UNABLE TO CALCULATE IF TRIGLYCERIDE OVER 400 mg/dL  TRIG >5,000*  CHOLHDL NOT REPORTED DUE TO HIGH TRIGLYCERIDES   Thyroid Function Tests: Recent Labs    12/19/21 1953  TSH 1.258   Anemia Panel: No results for input(s): VITAMINB12, FOLATE, FERRITIN, TIBC, IRON, RETICCTPCT in the last 72 hours. Urine analysis:    Component Value Date/Time   COLORURINE YELLOW (A) 11/27/2019 0237   APPEARANCEUR CLEAR (A) 11/27/2019 0237   APPEARANCEUR Clear 10/28/2014 1723   LABSPEC 1.024 11/27/2019 0237   LABSPEC 1.023 10/28/2014 1723   PHURINE 5.0 11/27/2019 0237   GLUCOSEU 150 (A) 11/27/2019 0237   GLUCOSEU >=500 10/28/2014 1723   HGBUR NEGATIVE 11/27/2019 0237   BILIRUBINUR NEGATIVE 11/27/2019 0237   BILIRUBINUR Negative 10/28/2014 1723   KETONESUR 20 (A) 11/27/2019 0237   PROTEINUR 30 (A) 11/27/2019 0237   NITRITE NEGATIVE 11/27/2019 0237   LEUKOCYTESUR NEGATIVE 11/27/2019 0237   LEUKOCYTESUR Negative 10/28/2014 1723    Radiological Exams on Admission: I have personally reviewed images CT ABDOMEN PELVIS WO CONTRAST  Result Date: 12/19/2021 CLINICAL DATA:  Epigastric abdominal pain EXAM: CT ABDOMEN AND PELVIS WITHOUT CONTRAST TECHNIQUE: Multidetector CT imaging of the abdomen and pelvis was performed following the standard protocol without IV contrast. RADIATION DOSE REDUCTION: This exam was performed according to the departmental dose-optimization program  which includes automated exposure control, adjustment of  the mA and/or kV according to patient size and/or use of iterative reconstruction technique. COMPARISON:  01/15/2018 FINDINGS: Lower chest: No acute abnormality Hepatobiliary: Prior cholecystectomy. Diffuse fatty infiltration of the liver. No focal hepatic abnormality. Pancreas: No focal abnormality or ductal dilatation. Spleen: Splenomegaly with a craniocaudal length of 16.3 cm compared to 17 cm previously. No focal abnormality. Adrenals/Urinary Tract: No focal hepatic abnormality. Gallbladder unremarkable. Stomach/Bowel: Normal appendix. Stomach, large and small bowel grossly unremarkable. Prior gastric surgery/sleeve. Vascular/Lymphatic: No evidence of aneurysm or adenopathy. Reproductive: No visible focal abnormality. Other: No free fluid or free air. Musculoskeletal: No acute bony abnormality. IMPRESSION: No acute findings. Stable hepatic steatosis. Stable splenomegaly. Electronically Signed   By: Rolm Baptise M.D.   On: 12/19/2021 20:37    EKG: I have personally reviewed EKG: NSR    Assessment/Plan Principal Problem:   Acute recurrent pancreatitis - due to hypertriglyceridemia Active Problems:   Sleep apnea   DM (diabetes mellitus), type 2 with neurological complications (HCC)   HTN (hypertension)   Obesity (BMI 30-39.9)   Pancreatic insufficiency    Assessment and Plan: * Acute recurrent pancreatitis - due to hypertriglyceridemia Admit to stepdown bed. IV insulin for hypertriglyceridemia. Pt states his TG never gets below 1000. He has missed 2 office based IV-insulin infusions over the last month due to needing surgery. Po oxycodone at home was not helping with his pain. Pt is very knowledgeable about his disease. He wears an insulin pump normally. Will continue with IV dilaudid 2 mg q2h prn. Pt states he usually transitions to 1 mg q2-3h and then to po oxycodone 10 mg. Usually takes 2-3 days for IV to decrease his TG enough to go  home. He states he usually goes home when his TG are around 1000. Continue IV dextrose. Po ice chips. Continue npo status.  Pancreatic insufficiency Will hold pancreatic enzymes while NPO. Please restart when pt allowed to eat.  Obesity (BMI 30-39.9) Chronic.  HTN (hypertension) Stable.  DM (diabetes mellitus), type 2 with neurological complications (HCC) Will hold SQ insulin while he is on IV insulin gtts.  Sleep apnea Continue home CPAP.    DVT prophylaxis: Lovenox Code Status: Full Code Family Communication: no family at bedside  Disposition Plan: return home  Consults called: none  Admission status: Inpatient, Step Down Unit   Kristopher Oppenheim, DO [facility] 12/19/2021, 10:20 PM

## 2021-12-19 NOTE — Assessment & Plan Note (Signed)
Will hold pancreatic enzymes while NPO. Please restart when pt allowed to eat.

## 2021-12-19 NOTE — Subjective & Objective (Signed)
CC: abd pain, N/V ?HPI: ?46 year old white male with a history of diabetes type 2, on insulin pump, history of familial hypertriglyceridemia, history of recurrent pancreatitis due to hypertriglyceridemia, OSA on CPAP, hypertension presents to the ER today with 2 to 3 days of worsening abdominal pain.  Patient states that he normally gets IV insulin infusions in the endocrinology office every 2 weeks to help control his hypertriglyceridemia.  Unfortunately he is missed his last 2 office appointments due to outpatient surgery he had.  Patient knows that his triglycerides are very high.  He has been taking p.o. oxycodone 10 mg to help try to control the pain but this has not worked.  He states he started having nausea vomiting today.  He knew that it was his pancreatitis acting up again.  Started having some low-grade fevers as well. ? ?Patient states last time he need to be admitted to hospital for his triglycerides were about 2 years ago. ? ?EMR reviewed.  He is very consistent about going to endocrinology every 2 weeks for his outpatient infusions. ? ?Labs showed a triglyceride greater than 5000.  Total cholesterol of 698. ? ?Sodium 130, potassium 4.1, chloride 95, bicarb 20 ? ?Lipase 89 ? ?CT abdomen showed no acute intra-abdominal findings. ? ?[facility] hospitalist contacted for admission. ? ?

## 2021-12-19 NOTE — ED Provider Notes (Signed)
? ?[facility] ?Provider Note ? ? ? Event Date/Time  ? First MD Initiated Contact with Patient 12/19/21 1942   ?  (approximate) ? ? ?History  ? ?Abdominal Pain ? ? ?HPI ? ?Sergio [name] is a 46 y.o. male  man with medical history significant for familial hypertriglyceridemia, recurrent acute pancreatitis, type 2 diabetes mellitus, sleep apnea, depression and anxiety who presents for evaluation of 2 to 3 days of worsening epigastric abdominal pain associate with nausea and vomiting.  Patient states this is similar to pancreatitis attacks he has had in the past related to his high triglycerides.  States he missed his last insulin infusion because he had it was skin cancer surgery on the scalp and has been over a month since he had any insulin infusions.  States has not wearing his Dexcom and insulin pump and that he has not had any issues with his sugars but he took it off work for coming into the emergency room.  No diarrhea, urinary symptoms, lower abdominal pain, back pain, chest pain, cough, shortness of breath, headache, earache, sore throat or other acute sick symptoms.  He denies any EtOH use, illicit drug use or tobacco abuse.  States he has been taking Percocets at home which he had leftover from a prior episode without much improvement of his pain.  No other acute concerns at this time. ? ?  ? ? ?Physical Exam  ?Triage Vital Signs: ?ED Triage Vitals  ?Enc Vitals Group  ?   BP 12/19/21 1949 (!) 150/104  ?   Pulse Rate 12/19/21 1949 (!) 105  ?   Resp 12/19/21 1949 20  ?   Temp 12/19/21 1949 98.2 ?F (36.8 ?C)  ?   Temp Source 12/19/21 1949 Oral  ?   SpO2 12/19/21 1949 95 %  ?   Weight 12/19/21 1947 260 lb (117.9 kg)  ?   Height 12/19/21 1947 5' 11"  (1.803 m)  ?   Head Circumference --   ?   Peak Flow --   ?   Pain Score 12/19/21 1945 8  ?   Pain Loc --   ?   Pain Edu? --   ?   Excl. in [place]? --   ? ? ?Most recent vital signs: ?Vitals:  ? 12/19/21 2010 12/19/21 2115  ?BP: (!) 148/97    ?Pulse: 96 88  ?Resp: (!) 22   ?Temp:    ?SpO2: 95% 93%  ? ? ?General: Awake, uncomfortable appearing. ?CV:  Good peripheral perfusion.  2+ radial pulse. ?Resp:  Normal effort.  Clear bilaterally. ?Abd:  No distention.  Tenderness in epigastrium but otherwise soft with no tenderness in the lower quadrants. ?Other:  Dry mucous membranes. ? ? ?ED Results / Procedures / Treatments  ?Labs ?(all labs ordered are listed, but only abnormal results are displayed) ?Labs Reviewed  ?CBC WITH DIFFERENTIAL/PLATELET - Abnormal; Notable for the following components:  ?    Result Value  ? RDW 15.9 (*)   ? nRBC 0.3 (*)   ? All other components within normal limits  ?COMPREHENSIVE METABOLIC PANEL - Abnormal; Notable for the following components:  ? Sodium 130 (*)   ? Chloride 95 (*)   ? CO2 20 (*)   ? Glucose, Bld 294 (*)   ? Total Protein 8.2 (*)   ? AST 165 (*)   ? ALT 182 (*)   ? Alkaline Phosphatase 164 (*)   ? Total Bilirubin 1.7 (*)   ? All  other components within normal limits  ?LIPASE, BLOOD - Abnormal; Notable for the following components:  ? Lipase 89 (*)   ? All other components within normal limits  ?LIPID PANEL - Abnormal; Notable for the following components:  ? Cholesterol 698 (*)   ? Triglycerides >5,000 (*)   ? All other components within normal limits  ?RESP PANEL BY RT-PCR (FLU A&B, COVID) ARPGX2  ?TSH  ?URINALYSIS, COMPLETE (UACMP) WITH MICROSCOPIC  ?LDL CHOLESTEROL, DIRECT  ?CBG MONITORING, ED  ?CBG MONITORING, ED  ?CBG MONITORING, ED  ?CBG MONITORING, ED  ?CBG MONITORING, ED  ?CBG MONITORING, ED  ?CBG MONITORING, ED  ?CBG MONITORING, ED  ?CBG MONITORING, ED  ?CBG MONITORING, ED  ?CBG MONITORING, ED  ?CBG MONITORING, ED  ? ? ? ?EKG ? ?EKG is remarkable sinus rhythm with a ventricular rate of 98, normal axis, unremarkable intervals without evidence of acute ischemia or significant arrhythmia. ? ? ?RADIOLOGY ? ?CT abdomen pelvis on my interpretation without evidence of clear acute pancreatitis, SBO,  diverticulitis, appendicitis or other clear acute process.  I also reviewed radiology interpretation and agree with their findings of same including stable splenomegaly and some hepatic steatosis. ? ? ?PROCEDURES: ? ?Critical Care performed: Yes, see critical care procedure note(s) ? ?.Critical Care ?Performed by: Lucrezia Starch, MD ?Authorized by: Lucrezia Starch, MD  ? ?Critical care provider statement:  ?  Critical care time (minutes):  30 ?  Critical care was necessary to treat or prevent imminent or life-threatening deterioration of the following conditions:  Metabolic crisis (Hypertriglyceridemia requiring insulin drip.) ?  Critical care was time spent personally by me on the following activities:  Development of treatment plan with patient or surrogate, discussions with consultants, evaluation of patient's response to treatment, examination of patient, ordering and review of laboratory studies, ordering and review of radiographic studies, ordering and performing treatments and interventions, pulse oximetry, re-evaluation of patient's condition and review of old charts ? ? ?MEDICATIONS ORDERED IN ED: ?Medications  ?insulin (MYXREDLIN) 100 units/100 mL infusion for hypertriglyceridemia-induced pancreatitis (has no administration in time range)  ?dextrose 5 % solution (has no administration in time range)  ?HYDROmorphone (DILAUDID) injection 2 mg (has no administration in time range)  ?ondansetron (ZOFRAN) injection 4 mg (has no administration in time range)  ?ondansetron [facility]) injection 4 mg (4 mg Intravenous Given 12/19/21 2014)  ?lactated ringers bolus 1,000 mL (0 mLs Intravenous Stopped 12/19/21 2134)  ?HYDROmorphone (DILAUDID) injection 1 mg (1 mg Intravenous Given 12/19/21 2014)  ?HYDROmorphone (DILAUDID) injection 0.5 mg (0.5 mg Intravenous Given 12/19/21 2141)  ? ? ? ?IMPRESSION / MDM / ASSESSMENT AND PLAN / ED COURSE  ?I reviewed the triage vital signs and the nursing notes. ?             ?                ? ?Differential diagnosis includes, but is not limited to recurrent pancreatitis, gastritis, atypical ACS, SBO, colitis hepatitis, cholecystitis and metabolic derangements such as DKA. ? ?EKG very reassuring and overall I have a low suspicion for ACS at this time. ? ?CT abdomen pelvis on my interpretation without evidence of clear acute pancreatitis, SBO, diverticulitis, appendicitis or other clear acute process.  I also reviewed radiology interpretation and agree with their findings of same including stable splenomegaly and some hepatic steatosis. ? ?CBC shows no leukocytosis or acute anemia.  CMP is remarkable for sodium of 130, bicarb of 20, glucose of 294, AST of 165,  ALT of 182, alk phos of 164 and a T. bili of 1.7 with an anion gap of 15.  This is not suggestive of DKA.  Lipase 89 and absence of any findings of pancreatitis on CT is not suggestive of acute pancreatitis.  TSH is within normal limits.  Lipid panel does show triglycerides greater than 5000 and cholesterol of 698 and it seems the patient is fairly symptomatic from this and constantly be causing his transaminitis.  Low suspicion for acute cholestatic process at this time. ? ?I will start on insulin drip and admit to medicine service.  I discussed with the patient he is amenable with plan.  Also discussed with admitting hospitalist will place orders. ? ?  ? ? ?FINAL CLINICAL IMPRESSION(S) / ED DIAGNOSES  ? ?Final diagnoses:  ?Hypertriglyceridemia  ?Hyperglycemia  ?Transaminitis  ?Nausea and vomiting, unspecified vomiting type  ?Dehydration  ? ? ? ?Rx / DC Orders  ? ?ED Discharge Orders   ? ? None  ? ?  ? ? ? ?Note:  This document was prepared using Dragon voice recognition software and may include unintentional dictation errors. ?  ?Lucrezia Starch, MD ?12/19/21 2206 ? ?

## 2021-12-19 NOTE — ED Notes (Signed)
Lab called and notified of patient hx of high triglycerides and difficulty running labs due to such. Spoke with Veterinary surgeon.  ?

## 2021-12-19 NOTE — ED Notes (Addendum)
Pt given urinal and instructed on use ?

## 2021-12-19 NOTE — Assessment & Plan Note (Addendum)
Body mass index is 36.26 kg/m. Complicates overall care and prognosis.  Recommend lifestyle modifications including physical activity and diet for weight loss and overall long-term health.   

## 2021-12-19 NOTE — Assessment & Plan Note (Addendum)
Currently on insulin drip for severe hypertriglyceridemia with associated pancreatitis.  Also on dextrose infusion. ?Monitor CBGs closely. ?

## 2021-12-19 NOTE — Assessment & Plan Note (Addendum)
Continue home CPAP ?

## 2021-12-20 LAB — GLUCOSE, CAPILLARY
Glucose-Capillary: 197 mg/dL — ABNORMAL HIGH (ref 70–99)
Glucose-Capillary: 165 mg/dL — ABNORMAL HIGH (ref 70–99)
Glucose-Capillary: 144 mg/dL — ABNORMAL HIGH (ref 70–99)
Glucose-Capillary: 168 mg/dL — ABNORMAL HIGH (ref 70–99)
Glucose-Capillary: 144 mg/dL — ABNORMAL HIGH (ref 70–99)
Glucose-Capillary: 132 mg/dL — ABNORMAL HIGH (ref 70–99)
Glucose-Capillary: 126 mg/dL — ABNORMAL HIGH (ref 70–99)
Glucose-Capillary: 126 mg/dL — ABNORMAL HIGH (ref 70–99)
Glucose-Capillary: 149 mg/dL — ABNORMAL HIGH (ref 70–99)
Glucose-Capillary: 159 mg/dL — ABNORMAL HIGH (ref 70–99)
Glucose-Capillary: 160 mg/dL — ABNORMAL HIGH (ref 70–99)
Glucose-Capillary: 161 mg/dL — ABNORMAL HIGH (ref 70–99)
Glucose-Capillary: 172 mg/dL — ABNORMAL HIGH (ref 70–99)
Glucose-Capillary: 181 mg/dL — ABNORMAL HIGH (ref 70–99)
Glucose-Capillary: 205 mg/dL — ABNORMAL HIGH (ref 70–99)
Glucose-Capillary: 209 mg/dL — ABNORMAL HIGH (ref 70–99)
Glucose-Capillary: 215 mg/dL — ABNORMAL HIGH (ref 70–99)

## 2021-12-20 LAB — CBC WITH DIFFERENTIAL/PLATELET
Abs Immature Granulocytes: 0.01 10*3/uL (ref 0.00–0.07)
Basophils Absolute: 0 10*3/uL (ref 0.0–0.1)
Basophils Relative: 1 %
Eosinophils Absolute: 0.1 10*3/uL (ref 0.0–0.5)
Eosinophils Relative: 1 %
HCT: 43.4 % (ref 39.0–52.0)
Hemoglobin: 14.8 g/dL (ref 13.0–17.0)
Immature Granulocytes: 0 %
Lymphocytes Relative: 30 %
Lymphs Abs: 1.6 10*3/uL (ref 0.7–4.0)
MCH: 28 pg (ref 26.0–34.0)
MCHC: 34.1 g/dL (ref 30.0–36.0)
MCV: 82.2 fL (ref 80.0–100.0)
Monocytes Absolute: 0.4 10*3/uL (ref 0.1–1.0)
Monocytes Relative: 7 %
Neutro Abs: 3.2 10*3/uL (ref 1.7–7.7)
Neutrophils Relative %: 61 %
Platelets: 236 10*3/uL (ref 150–400)
RBC: 5.28 MIL/uL (ref 4.22–5.81)
RDW: 15.8 % — ABNORMAL HIGH (ref 11.5–15.5)
WBC: 5.3 10*3/uL (ref 4.0–10.5)
nRBC: 0.4 % — ABNORMAL HIGH (ref 0.0–0.2)

## 2021-12-20 LAB — URINALYSIS, COMPLETE (UACMP) WITH MICROSCOPIC
Bacteria, UA: NONE SEEN
Glucose, UA: >=500 mg/dL — AB
Hgb urine dipstick: NEGATIVE
Ketones, ur: 80 mg/dL — AB
Leukocytes,Ua: NEGATIVE
Nitrite: NEGATIVE
Protein, ur: 100 mg/dL — AB
Specific Gravity, Urine: 1.03 (ref 1.005–1.030)
pH: 5 (ref 5.0–8.0)

## 2021-12-20 LAB — MRSA NEXT GEN BY PCR, NASAL: MRSA by PCR Next Gen: NOT DETECTED

## 2021-12-20 LAB — CBG MONITORING, ED
Glucose-Capillary: 246 mg/dL — ABNORMAL HIGH (ref 70–99)
Glucose-Capillary: 248 mg/dL — ABNORMAL HIGH (ref 70–99)
Glucose-Capillary: 255 mg/dL — ABNORMAL HIGH (ref 70–99)

## 2021-12-20 LAB — HIV ANTIBODY (ROUTINE TESTING W REFLEX): HIV Screen 4th Generation wRfx: NONREACTIVE

## 2021-12-20 LAB — TRIGLYCERIDES: Triglycerides: >5000 mg/dL — ABNORMAL HIGH (ref <150)

## 2021-12-20 MED ORDER — ENOXAPARIN SODIUM 40 MG/0.4ML IJ SOSY: 40.0000 mg | PREFILLED_SYRINGE | INTRAMUSCULAR | Status: DC

## 2021-12-20 MED ORDER — SERTRALINE HCL 50 MG PO TABS
100.0000 mg | ORAL_TABLET | Freq: Every day | ORAL | Status: DC
Start: 2021-12-21 — End: 2021-12-20

## 2021-12-20 MED ORDER — SERTRALINE HCL 50 MG PO TABS: 25.0000 mg | ORAL_TABLET | Freq: Every day | ORAL | Status: DC

## 2021-12-20 MED ORDER — PREGABALIN 75 MG PO CAPS
300.0000 mg | ORAL_CAPSULE | Freq: Every day | ORAL | Status: DC
  Administered 2021-12-20 – 2021-12-28 (×9): 300 mg via ORAL
  Filled 2021-12-20 (×9): qty 4

## 2021-12-20 MED ORDER — LISINOPRIL 5 MG PO TABS
10.0000 mg | ORAL_TABLET | Freq: Every day | ORAL | Status: DC
  Administered 2021-12-20 – 2021-12-29 (×10): 10 mg via ORAL
  Filled 2021-12-20 (×6): qty 2
  Filled 2021-12-20: qty 1
  Filled 2021-12-20 (×3): qty 2

## 2021-12-20 MED ORDER — ACETAMINOPHEN 650 MG RE SUPP: 650.0000 mg | Freq: Four times a day (QID) | RECTAL | Status: DC | PRN

## 2021-12-20 MED ORDER — DEXTROSE 10 % IV SOLN: INTRAVENOUS | Status: DC

## 2021-12-20 MED ORDER — TRAZODONE HCL 100 MG PO TABS
200.0000 mg | ORAL_TABLET | Freq: Every day | ORAL | Status: DC | PRN
  Administered 2021-12-23 – 2021-12-26 (×4): 200 mg via ORAL
  Filled 2021-12-20 (×2): qty 2
  Filled 2021-12-20: qty 4
  Filled 2021-12-20: qty 2

## 2021-12-20 MED ORDER — ENOXAPARIN SODIUM 60 MG/0.6ML IJ SOSY
0.5000 mg/kg | PREFILLED_SYRINGE | INTRAMUSCULAR | Status: DC
  Administered 2021-12-20 – 2021-12-28 (×9): 60 mg via SUBCUTANEOUS
  Filled 2021-12-20 (×9): qty 0.6

## 2021-12-20 MED ORDER — OMEGA-3-ACID ETHYL ESTERS 1 G PO CAPS
3.0000 g | ORAL_CAPSULE | Freq: Two times a day (BID) | ORAL | Status: DC
  Administered 2021-12-20 (×2): 3 g via ORAL
  Filled 2021-12-20 (×2): qty 3

## 2021-12-20 MED ORDER — PROCHLORPERAZINE EDISYLATE 10 MG/2ML IJ SOLN
10.0000 mg | Freq: Four times a day (QID) | INTRAMUSCULAR | Status: DC | PRN
  Administered 2021-12-20 (×2): 10 mg via INTRAVENOUS
  Filled 2021-12-20 (×4): qty 2

## 2021-12-20 MED ORDER — CHLORHEXIDINE GLUCONATE CLOTH 2 % EX PADS
6.0000 | MEDICATED_PAD | Freq: Every day | CUTANEOUS | Status: DC
  Administered 2021-12-20 – 2021-12-27 (×8): 6 via TOPICAL

## 2021-12-20 MED ORDER — GEMFIBROZIL 600 MG PO TABS
600.0000 mg | ORAL_TABLET | Freq: Two times a day (BID) | ORAL | Status: DC
  Administered 2021-12-20 – 2021-12-29 (×19): 600 mg via ORAL
  Filled 2021-12-20 (×21): qty 1

## 2021-12-20 MED ORDER — ACETAMINOPHEN 325 MG PO TABS
650.0000 mg | ORAL_TABLET | Freq: Four times a day (QID) | ORAL | Status: DC | PRN
  Administered 2021-12-20: 650 mg via ORAL
  Filled 2021-12-20: qty 2

## 2021-12-20 MED ORDER — DEXTROSE 50 % IV SOLN
25.0000 g | INTRAVENOUS | Status: DC | PRN
  Administered 2021-12-24: 25 g via INTRAVENOUS
  Filled 2021-12-20: qty 50

## 2021-12-20 MED ORDER — SERTRALINE HCL 50 MG PO TABS
100.0000 mg | ORAL_TABLET | Freq: Every day | ORAL | Status: DC
  Administered 2021-12-20 – 2021-12-29 (×10): 100 mg via ORAL
  Filled 2021-12-20 (×10): qty 2

## 2021-12-20 MED ORDER — ROSUVASTATIN CALCIUM 10 MG PO TABS
10.0000 mg | ORAL_TABLET | Freq: Every day | ORAL | Status: DC
  Administered 2021-12-20 – 2021-12-28 (×9): 10 mg via ORAL
  Filled 2021-12-20 (×9): qty 1

## 2021-12-20 MED ORDER — ALPRAZOLAM 0.5 MG PO TABS
0.5000 mg | ORAL_TABLET | Freq: Two times a day (BID) | ORAL | Status: DC | PRN
  Administered 2021-12-21 – 2021-12-26 (×6): 0.5 mg via ORAL
  Filled 2021-12-20 (×6): qty 1

## 2021-12-20 NOTE — ED Notes (Signed)
Called stepdown to give report to receiving RN. Per Network engineer, not ready for patient.  ?

## 2021-12-20 NOTE — Hospital Course (Signed)
46 year old white male with a history of diabetes type 2, on insulin pump, history of familial hypertriglyceridemia, history of recurrent pancreatitis due to hypertriglyceridemia, OSA on CPAP, hypertension presented to the ER on the evening of 12/19/2021 with 2 to 3 days of worsening abdominal pain not improving with use of oxycodone prescription, nausea and vomiting with low-grade fevers.  Patient undergoes outpatient IV insulin infusions with endocrinology every 2 weeks, had recently missed 2 of those appointments due to outpatient surgeries.  Evaluation in the ED showed triglycerides above 5000.  He was started on insulin infusion and admitted to stepdown unit.

## 2021-12-20 NOTE — Progress Notes (Signed)
PHARMACIST - PHYSICIAN COMMUNICATION ? ?CONCERNING:  Enoxaparin (Lovenox) for DVT Prophylaxis  ? ? ?RECOMMENDATION: ?Patient was prescribed enoxaprin '40mg'$  q24 hours for VTE prophylaxis.  ? Danley Danker Weights  ? 12/19/21 1947  ?Weight: 117.9 kg (260 lb)  ? ? ?Body mass index is 36.26 kg/m?. ? ?Estimated Creatinine Clearance: 136.7 mL/min (by C-G formula based on SCr of 0.9 mg/dL). ? ? ?Based on Ramos patient is candidate for enoxaparin 0.'5mg'$ /kg TBW SQ every 24 hours based on BMI being >30. ? ?DESCRIPTION: ?Pharmacy has adjusted enoxaparin dose per [facility] policy. ? ?Patient is now receiving enoxaparin 60 mg every 24 hours  ? ? ?Vira Blanco, PharmD ?Clinical Pharmacist  ?12/20/2021 ?1:01 AM ? ?

## 2021-12-20 NOTE — Plan of Care (Signed)
  Problem: Education: Goal: Knowledge of Pancreatitis treatment and prevention will improve Outcome: Progressing   Problem: Health Behavior/Discharge Planning: Goal: Ability to formulate a plan to maintain an alcohol-free life will improve Outcome: Progressing   Problem: Nutritional: Goal: Ability to achieve adequate nutritional intake will improve Outcome: Progressing   Problem: Clinical Measurements: Goal: Complications related to the disease process, condition or treatment will be avoided or minimized Outcome: Progressing   

## 2021-12-20 NOTE — Progress Notes (Signed)
?Progress Note ? ? ?Patient: Sergio [name]:[id number] DOB: December 20, 1975 DOA: 12/19/2021     1 ?DOS: the patient was seen and examined on 12/20/2021 ?  ?Brief hospital course: ?46 year old white male with a history of diabetes type 2, on insulin pump, history of familial hypertriglyceridemia, history of recurrent pancreatitis due to hypertriglyceridemia, OSA on CPAP, hypertension presented to the ER on the evening of 12/19/2021 with 2 to 3 days of worsening abdominal pain not improving with use of oxycodone prescription, nausea and vomiting with low-grade fevers.  Patient undergoes outpatient IV insulin infusions with endocrinology every 2 weeks, had recently missed 2 of those appointments due to outpatient surgeries. ? ?Evaluation in the ED showed triglycerides above 5000.  He was started on insulin infusion and admitted to stepdown unit. ? ?Assessment and Plan: ?* Acute recurrent pancreatitis - due to hypertriglyceridemia ?Continue insulin infusion in stepdown unit. ?Continue dextrose infusion to prevent hypoglycemia. ?Trend triglycerides, likely DC once <1000. ?Continue endocrinology follow-up as an outpatient infusions as scheduled call if concern for needing to be seen sooner. ?Uses insulin pump at home. ?Pain control with as needed IV Dilaudid, transition to oral pain medication once. tolerating p.o. ?Antiemetics as needed. ?N.p.o. plus ice chips for now. ? ? ? ?Pancreatic insufficiency ?Will hold pancreatic enzymes while NPO. Please restart when pt allowed to eat. ? ?Obesity (BMI 30-39.9) ?Body mass index is 36.26 kg/m?[name] ?Complicates overall care and prognosis.  Recommend lifestyle modifications including physical activity and diet for weight loss and overall long-term health. ? ?HTN (hypertension) ?BP stable, mildly elevated at times.  ?Appears not on antihypertensives at home.  Monitor BP. ? ?DM (diabetes mellitus), type 2 with neurological complications ([place]) ?Currently on insulin drip ? ?Sleep  apnea ?Continue home CPAP. ? ? ? ? ?  ? ?Subjective: Patient seen awake resting in bed in stepdown unit this morning.  He reports ongoing episodes of nausea, says it hits hard and suddenly.  Still having some intermittent epigastric abdominal pain but a little bit better.  Overall, patient feels like things are on the right track. ? ?Physical Exam: ?Vitals:  ? 12/20/21 1022 12/20/21 1103 12/20/21 1200 12/20/21 1300  ?BP: 115/67 114/79 128/80 (!) 142/72  ?Pulse: 84 71 74 75  ?Resp: 17 (!) 8 (!) 26 13  ?Temp:      ?TempSrc:      ?SpO2: 95% (!) 87% 94% 96%  ?Weight:      ?Height:      ? ?General exam: awake, alert, no acute distress ?HEENT: Wearing nasal CPAP mask moist mucus membranes, hearing grossly normal  ?Respiratory system: CTAB, no wheezes, rales or rhonchi, normal respiratory effort. ?Cardiovascular system: normal S1/S2, RRR, no JVD, murmurs, rubs, gallops, no pedal edema.   ?Gastrointestinal system: soft, bowel sounds present, epigastric tenderness ?Central nervous system: A&O x3. no gross focal neurologic deficits, normal speech ?Extremities: moves all, no edema, normal tone ?Skin: dry, intact, normal temperature, normal color ?Psychiatry: normal mood, congruent affect, judgement and insight appear normal ? ? ? ?Data Reviewed: ? ?Labs reviewed and notable for CBGs now in the mid 100s.  CMP notable for sodium 130, chloride 95, glucose 239, alk phos 160, lipase 140, AST 165, ALT 286, total bili 3.1, triglycerides greater than 5000, CBC normal other than mildly elevated RDW 15.8.  Urinalysis negative for any infection.  MRSA PCR screen negative ? ?Family Communication: None at bedside, will attempt to call.  Patient is able to update ? ?Disposition: ?Status is: Inpatient ?Remains inpatient appropriate  because: Severity of illness continuing on insulin infusion ? ? ? Planned Discharge Destination: Home ? ? ? ?Time spent: 35 minutes ? ?Author: ?Ezekiel Slocumb, DO ?12/20/2021 2:18 PM ? ?For on call review  [email or web address].  ?

## 2021-12-21 LAB — COMPREHENSIVE METABOLIC PANEL
ALT: UNDETERMINED U/L (ref 0–44)
AST: UNDETERMINED U/L (ref 15–41)
Albumin: 3.9 g/dL (ref 3.5–5.0)
Alkaline Phosphatase: UNDETERMINED U/L (ref 38–126)
Anion gap: UNDETERMINED (ref 5–15)
BUN: 7 mg/dL (ref 6–20)
CO2: 27 mmol/L (ref 22–32)
Calcium: 8.4 mg/dL — ABNORMAL LOW (ref 8.9–10.3)
Chloride: 87 mmol/L — ABNORMAL LOW (ref 98–111)
Creatinine, Ser: 0.85 mg/dL (ref 0.61–1.24)
GFR, Estimated: >60 mL/min (ref >60)
Glucose, Bld: UNDETERMINED mg/dL (ref 70–99)
Potassium: UNDETERMINED mmol/L (ref 3.5–5.1)
Sodium: UNDETERMINED mmol/L (ref 135–145)
Total Bilirubin: UNDETERMINED mg/dL (ref 0.3–1.2)
Total Protein: 5.4 g/dL — ABNORMAL LOW (ref 6.5–8.1)

## 2021-12-21 LAB — GLUCOSE, CAPILLARY
Glucose-Capillary: 101 mg/dL — ABNORMAL HIGH (ref 70–99)
Glucose-Capillary: 109 mg/dL — ABNORMAL HIGH (ref 70–99)
Glucose-Capillary: 112 mg/dL — ABNORMAL HIGH (ref 70–99)
Glucose-Capillary: 114 mg/dL — ABNORMAL HIGH (ref 70–99)
Glucose-Capillary: 117 mg/dL — ABNORMAL HIGH (ref 70–99)
Glucose-Capillary: 117 mg/dL — ABNORMAL HIGH (ref 70–99)
Glucose-Capillary: 119 mg/dL — ABNORMAL HIGH (ref 70–99)
Glucose-Capillary: 122 mg/dL — ABNORMAL HIGH (ref 70–99)
Glucose-Capillary: 124 mg/dL — ABNORMAL HIGH (ref 70–99)
Glucose-Capillary: 127 mg/dL — ABNORMAL HIGH (ref 70–99)
Glucose-Capillary: 127 mg/dL — ABNORMAL HIGH (ref 70–99)
Glucose-Capillary: 128 mg/dL — ABNORMAL HIGH (ref 70–99)
Glucose-Capillary: 129 mg/dL — ABNORMAL HIGH (ref 70–99)
Glucose-Capillary: 136 mg/dL — ABNORMAL HIGH (ref 70–99)
Glucose-Capillary: 141 mg/dL — ABNORMAL HIGH (ref 70–99)
Glucose-Capillary: 151 mg/dL — ABNORMAL HIGH (ref 70–99)
Glucose-Capillary: 181 mg/dL — ABNORMAL HIGH (ref 70–99)
Glucose-Capillary: 206 mg/dL — ABNORMAL HIGH (ref 70–99)
Glucose-Capillary: 208 mg/dL — ABNORMAL HIGH (ref 70–99)
Glucose-Capillary: 214 mg/dL — ABNORMAL HIGH (ref 70–99)
Glucose-Capillary: 223 mg/dL — ABNORMAL HIGH (ref 70–99)
Glucose-Capillary: 229 mg/dL — ABNORMAL HIGH (ref 70–99)
Glucose-Capillary: 229 mg/dL — ABNORMAL HIGH (ref 70–99)
Glucose-Capillary: 230 mg/dL — ABNORMAL HIGH (ref 70–99)
Glucose-Capillary: 238 mg/dL — ABNORMAL HIGH (ref 70–99)
Glucose-Capillary: 249 mg/dL — ABNORMAL HIGH (ref 70–99)
Glucose-Capillary: 263 mg/dL — ABNORMAL HIGH (ref 70–99)

## 2021-12-21 LAB — LIPASE, BLOOD: Lipase: UNDETERMINED U/L (ref 11–51)

## 2021-12-21 LAB — TRIGLYCERIDES: Triglycerides: 2480 mg/dL — ABNORMAL HIGH (ref <150)

## 2021-12-21 MED ORDER — OMEGA-3-ACID ETHYL ESTERS 1 G PO CAPS
3.0000 g | ORAL_CAPSULE | Freq: Every day | ORAL | Status: DC
  Administered 2021-12-21 – 2021-12-28 (×8): 3 g via ORAL
  Filled 2021-12-21 (×8): qty 3

## 2021-12-21 MED ORDER — HYDROMORPHONE HCL 1 MG/ML IJ SOLN
1.0000 mg | INTRAMUSCULAR | Status: DC | PRN
  Administered 2021-12-21 – 2021-12-25 (×32): 1 mg via INTRAVENOUS
  Filled 2021-12-21 (×32): qty 1

## 2021-12-21 MED ORDER — DEXTROSE 5 % IV SOLN: INTRAVENOUS | Status: DC

## 2021-12-21 MED ORDER — DEXTROSE 10 % IV SOLN: INTRAVENOUS | Status: DC

## 2021-12-21 MED ORDER — HYDROMORPHONE HCL 1 MG/ML IJ SOLN
1.0000 mg | INTRAMUSCULAR | Status: DC | PRN
  Filled 2021-12-21: qty 1

## 2021-12-21 NOTE — Progress Notes (Addendum)
DO [facility] [place] notified via secure chat that triglyceride level returned at 2480. CBG 230 at 0700, trending down with result of 101 at 1334. Messaged to notify DO to see if changes wanted to D5 going at 100 currently.  ? ?D5 discontinued and new order placed for D10 at 151m/hr. ? ?Sergio [name] lying comfortably in bed at this time. All needs met.   ?

## 2021-12-21 NOTE — Progress Notes (Signed)
?  Progress Note ? ? ?Patient: Sergio [name] YTK:[id number] DOB: 27-Nov-1975 DOA: 12/19/2021     2 ?DOS: the patient was seen and examined on 12/21/2021 ?  ?Brief hospital course: ?46 year old white male with a history of diabetes type 2, on insulin pump, history of familial hypertriglyceridemia, history of recurrent pancreatitis due to hypertriglyceridemia, OSA on CPAP, hypertension presented to the ER on the evening of 12/19/2021 with 2 to 3 days of worsening abdominal pain not improving with use of oxycodone prescription, nausea and vomiting with low-grade fevers.  Patient undergoes outpatient IV insulin infusions with endocrinology every 2 weeks, had recently missed 2 of those appointments due to outpatient surgeries. ? ?Evaluation in the ED showed triglycerides above 5000.  He was started on insulin infusion and admitted to stepdown unit. ? ?Assessment and Plan: ?* Acute recurrent pancreatitis - due to hypertriglyceridemia ?Continue insulin infusion in stepdown unit. ?Continue dextrose infusion to prevent hypoglycemia. ?Trend triglycerides, likely DC once <1000. ?Continue endocrinology follow-up as an outpatient infusions as scheduled call if concern for needing to be seen sooner. ?Uses insulin pump at home. ?Pain control with as needed IV Dilaudid, transition to oral pain medication once. tolerating p.o. ?Antiemetics as needed. ?N.p.o. plus ice chips for now. ? ?3/13: Triglycerides improved to 2480 ? ? ?Pancreatic insufficiency ?Will hold pancreatic enzymes while NPO. Please restart when pt allowed to eat. ? ?Obesity (BMI 30-39.9) ?Body mass index is 36.26 kg/m?[name] ?Complicates overall care and prognosis.  Recommend lifestyle modifications including physical activity and diet for weight loss and overall long-term health. ? ?HTN (hypertension) ?BP stable, mildly elevated at times.  ?Appears not on antihypertensives at home.  Monitor BP. ? ?DM (diabetes mellitus), type 2 with neurological complications ([place]) ?Currently  on insulin drip for severe hypertriglyceridemia with associated pancreatitis.  Also on dextrose infusion. ?Monitor CBGs closely. ? ?Sleep apnea ?Continue home CPAP. ? ? ? ? ?  ? ?Subjective: Patient seen awake resting in bed in ICU this morning.  Reports overall feeling somewhat better.  Continues to have epigastric abdominal pain, intermittent nausea although improved.  Requests fish oil to be given at bedtime.  He denies any other acute complaints at this time. ? ?Physical Exam: ?Vitals:  ? 12/21/21 0400 12/21/21 0500 12/21/21 0600 12/21/21 0700  ?BP: 112/73 126/84 122/80 122/81  ?Pulse: 69 61 68 64  ?Resp:  (!) 7 11 (!) 8  ?Temp:    97.9 ?F (36.6 ?C)  ?TempSrc:    Oral  ?SpO2: 96% 93% 95% 97%  ?Weight:      ?Height:      ? ?General exam: awake, alert, no acute distress ?HEENT: Wearing nasal CPAP, moist mucus membranes, hearing grossly normal  ?Respiratory system: normal respiratory effort, symmetric chest rise. ?Cardiovascular system: normal S1/S2,  RRR, no peripheral edema.   ?Gastrointestinal system: Epigastric tenderness improved, no rebound or guarding ?Central nervous system: A&O x3. no gross focal neurologic deficits, normal speech ?Extremities: moves all, no edema, normal tone ?Skin: dry, intact, normal temperature ?Psychiatry: normal mood, congruent affect, judgement and insight appear normal ? ? ?Data Reviewed: ? ?The majority of labs are unavailable, were unable to be processed.  Triglycerides improved down to 2480 from above 5000 ? ?Family Communication: None ? ?Disposition: ?Status is: Inpatient ?Remains inpatient appropriate because: Severity of illness remaining on IV insulin infusion ? ? ? Planned Discharge Destination: Home ? ? ? ?Time spent: 35 minutes ? ?Author: ?Ezekiel Slocumb, DO ?12/21/2021 1:21 PM ? ?For on call review [email or web address].  ?

## 2021-12-22 DIAGNOSIS — K859 Acute pancreatitis without necrosis or infection, unspecified: Secondary | ICD-10-CM | POA: Diagnosis not present

## 2021-12-22 LAB — COMPREHENSIVE METABOLIC PANEL
ALT: 182 U/L — ABNORMAL HIGH (ref 0–44)
ALT: 286 U/L — ABNORMAL HIGH (ref 0–44)
ALT: 293 U/L — ABNORMAL HIGH (ref 0–44)
AST: 165 U/L — ABNORMAL HIGH (ref 15–41)
AST: 165 U/L — ABNORMAL HIGH (ref 15–41)
Albumin: 4.1 g/dL (ref 3.5–5.0)
Albumin: 3.7 g/dL (ref 3.5–5.0)
Albumin: 3.3 g/dL — ABNORMAL LOW (ref 3.5–5.0)
Alkaline Phosphatase: 164 U/L — ABNORMAL HIGH (ref 38–126)
Alkaline Phosphatase: 160 U/L — ABNORMAL HIGH (ref 38–126)
Alkaline Phosphatase: 137 U/L — ABNORMAL HIGH (ref 38–126)
Anion gap: 15 (ref 5–15)
Anion gap: 12 (ref 5–15)
Anion gap: 9 (ref 5–15)
BUN: 10 mg/dL (ref 6–20)
BUN: 9 mg/dL (ref 6–20)
BUN: 5 mg/dL — ABNORMAL LOW (ref 6–20)
CO2: 20 mmol/L — ABNORMAL LOW (ref 22–32)
CO2: 23 mmol/L (ref 22–32)
CO2: 28 mmol/L (ref 22–32)
Calcium: 9.4 mg/dL (ref 8.9–10.3)
Calcium: 9 mg/dL (ref 8.9–10.3)
Calcium: 8.6 mg/dL — ABNORMAL LOW (ref 8.9–10.3)
Chloride: 95 mmol/L — ABNORMAL LOW (ref 98–111)
Chloride: 95 mmol/L — ABNORMAL LOW (ref 98–111)
Chloride: 87 mmol/L — ABNORMAL LOW (ref 98–111)
Creatinine, Ser: 0.9 mg/dL (ref 0.61–1.24)
Creatinine, Ser: 0.8 mg/dL (ref 0.61–1.24)
Creatinine, Ser: 0.65 mg/dL (ref 0.61–1.24)
GFR, Estimated: >60 mL/min (ref >60)
GFR, Estimated: >60 mL/min (ref >60)
GFR, Estimated: >60 mL/min (ref >60)
Glucose, Bld: 294 mg/dL — ABNORMAL HIGH (ref 70–99)
Glucose, Bld: 239 mg/dL — ABNORMAL HIGH (ref 70–99)
Glucose, Bld: 119 mg/dL — ABNORMAL HIGH (ref 70–99)
Potassium: 4.1 mmol/L (ref 3.5–5.1)
Potassium: 4.7 mmol/L (ref 3.5–5.1)
Sodium: 130 mmol/L — ABNORMAL LOW (ref 135–145)
Sodium: 130 mmol/L — ABNORMAL LOW (ref 135–145)
Sodium: 124 mmol/L — ABNORMAL LOW (ref 135–145)
Total Bilirubin: 1.7 mg/dL — ABNORMAL HIGH (ref 0.3–1.2)
Total Bilirubin: 3.1 mg/dL — ABNORMAL HIGH (ref 0.3–1.2)
Total Protein: 8.2 g/dL — ABNORMAL HIGH (ref 6.5–8.1)
Total Protein: 7 g/dL (ref 6.5–8.1)
Total Protein: 6.7 g/dL (ref 6.5–8.1)

## 2021-12-22 LAB — GLUCOSE, CAPILLARY
Glucose-Capillary: 102 mg/dL — ABNORMAL HIGH (ref 70–99)
Glucose-Capillary: 104 mg/dL — ABNORMAL HIGH (ref 70–99)
Glucose-Capillary: 107 mg/dL — ABNORMAL HIGH (ref 70–99)
Glucose-Capillary: 108 mg/dL — ABNORMAL HIGH (ref 70–99)
Glucose-Capillary: 111 mg/dL — ABNORMAL HIGH (ref 70–99)
Glucose-Capillary: 113 mg/dL — ABNORMAL HIGH (ref 70–99)
Glucose-Capillary: 115 mg/dL — ABNORMAL HIGH (ref 70–99)
Glucose-Capillary: 116 mg/dL — ABNORMAL HIGH (ref 70–99)
Glucose-Capillary: 116 mg/dL — ABNORMAL HIGH (ref 70–99)
Glucose-Capillary: 116 mg/dL — ABNORMAL HIGH (ref 70–99)
Glucose-Capillary: 117 mg/dL — ABNORMAL HIGH (ref 70–99)
Glucose-Capillary: 120 mg/dL — ABNORMAL HIGH (ref 70–99)
Glucose-Capillary: 120 mg/dL — ABNORMAL HIGH (ref 70–99)
Glucose-Capillary: 127 mg/dL — ABNORMAL HIGH (ref 70–99)
Glucose-Capillary: 128 mg/dL — ABNORMAL HIGH (ref 70–99)
Glucose-Capillary: 135 mg/dL — ABNORMAL HIGH (ref 70–99)
Glucose-Capillary: 136 mg/dL — ABNORMAL HIGH (ref 70–99)
Glucose-Capillary: 152 mg/dL — ABNORMAL HIGH (ref 70–99)
Glucose-Capillary: 76 mg/dL (ref 70–99)
Glucose-Capillary: 79 mg/dL (ref 70–99)
Glucose-Capillary: 94 mg/dL (ref 70–99)
Glucose-Capillary: 98 mg/dL (ref 70–99)
Glucose-Capillary: 99 mg/dL (ref 70–99)

## 2021-12-22 LAB — LIPASE, BLOOD
Lipase: 109 U/L — ABNORMAL HIGH (ref 11–51)
Lipase: 140 U/L — ABNORMAL HIGH (ref 11–51)
Lipase: 46 U/L (ref 11–51)

## 2021-12-22 LAB — MAGNESIUM: Magnesium: 1.8 mg/dL (ref 1.7–2.4)

## 2021-12-22 LAB — TRIGLYCERIDES: Triglycerides: 2813 mg/dL — ABNORMAL HIGH (ref <150)

## 2021-12-22 MED ORDER — HYDROMORPHONE HCL 1 MG/ML IJ SOLN
1.0000 mg | Freq: Once | INTRAMUSCULAR | Status: AC
  Administered 2021-12-22: 1 mg via INTRAVENOUS
  Filled 2021-12-22: qty 1

## 2021-12-22 MED ORDER — DEXTROSE-NACL 5-0.9 % IV SOLN: INTRAVENOUS | Status: DC

## 2021-12-22 MED ORDER — SODIUM CHLORIDE 0.9 % IV SOLN: INTRAVENOUS | Status: DC

## 2021-12-22 MED ORDER — SODIUM CHLORIDE 1 G PO TABS
1.0000 g | ORAL_TABLET | Freq: Three times a day (TID) | ORAL | Status: DC
  Administered 2021-12-22 – 2021-12-29 (×21): 1 g via ORAL
  Filled 2021-12-22 (×22): qty 1

## 2021-12-22 NOTE — Assessment & Plan Note (Addendum)
Sodium again decreased to 127, most likely being on D10 and poor p.o. intake as hyponatremia labs with hypotonic urine, low sodium and normal serum osmolality. ?-Start him on normal saline ?-Monitor sodium   ?

## 2021-12-22 NOTE — Progress Notes (Signed)
?  Transition of Care (TOC) Screening Note ? ? ?Patient Details  ?Name: Sergio [name] ?Date of Birth: Feb 06, 1976 ? ? ?Transition of Care (TOC) CM/SW Contact:    ?Shelbie Hutching, RN ?Phone Number: ?12/22/2021, 5:06 PM ? ? ? ?Transition of Care Department [facility]) has reviewed patient and no TOC needs have been identified at this time. We will continue to monitor patient advancement through interdisciplinary progression rounds. If new patient transition needs arise, please place a TOC consult. ?  ?

## 2021-12-22 NOTE — Progress Notes (Signed)
?Progress Note ? ? ?Patient: Sergio [name] VEH:[id number] DOB: 1976-09-26 DOA: 12/19/2021     3 ?DOS: the patient was seen and examined on 12/22/2021 ?  ?Brief hospital course: ?46 year old white male with a history of diabetes type 2, on insulin pump, history of familial hypertriglyceridemia, history of recurrent pancreatitis due to hypertriglyceridemia, OSA on CPAP, hypertension presented to the ER on the evening of 12/19/2021 with 2 to 3 days of worsening abdominal pain not improving with use of oxycodone prescription, nausea and vomiting with low-grade fevers.  Patient undergoes outpatient IV insulin infusions with endocrinology every 2 weeks, had recently missed 2 of those appointments due to outpatient surgeries. ? ?Evaluation in the ED showed triglycerides above 5000.  He was started on insulin infusion and admitted to stepdown unit. ? ?3/14: TG's 2813, tried on clear liquids but had worse abdominal pain ? ?Assessment and Plan: ?* Acute recurrent pancreatitis - due to hypertriglyceridemia ?Continue insulin infusion in stepdown unit. ?Continue dextrose infusion to prevent hypoglycemia. ?Trend triglycerides, likely DC once <1000. ?Endocrinology follow-up for infusions as scheduled, call if concern for needing to be seen sooner. ?Uses insulin pump at home. ?--PRN IV Dilaudid, transition to PO when able. ?--Antiemetics as needed. ?--Clear liquids (as tolerated), NPO ex sips if abdominal pain worsens. ? ?3/13: Triglycerides improved to 2480 ?3/14: TG's up slightly 2813 ? ? ?Hyponatremia ?Na 124 this AM, due to being on dextrose infusions and NPO.  Add NS fluids and follow BMP.   ? ?Pancreatic insufficiency ?Will hold pancreatic enzymes while NPO. Resume when tolerating PO intake. ? ?Obesity (BMI 30-39.9) ?Body mass index is 36.26 kg/m?[name] ?Complicates overall care and prognosis.  Recommend lifestyle modifications including physical activity and diet for weight loss and overall long-term health. ? ?HTN  (hypertension) ?BP stable, mildly elevated at times.  ?Appears not on antihypertensives at home.  Monitor BP. ? ?DM (diabetes mellitus), type 2 with neurological complications ([place]) ?Currently on insulin drip for severe hypertriglyceridemia with associated pancreatitis.  Also on dextrose infusion. ?Monitor CBGs closely. ? ?Sleep apnea ?Continue home CPAP. ? ? ? ? ?  ? ?Subjective: Pt awake resting in bed when seen in ICU this AM.  He requests to try clear liquids today, feeling somewhat better.   ? ?Physical Exam: ?Vitals:  ? 12/22/21 1240 12/22/21 1300 12/22/21 1400 12/22/21 1445  ?BP:  126/80 113/67   ?Pulse: 74 69 65 76  ?Resp: '17 11 10 13  '$ ?Temp:      ?TempSrc:      ?SpO2: 98% 98% 97% 98%  ?Weight:      ?Height:      ? ?General exam: awake, alert, no acute distress ?HEENT: wearing nasal CPAP mask, moist mucus membranes, hearing grossly normal  ?Respiratory system: normal respiratory effort. ?Cardiovascular system: normal S1/S2, RRR, no pedal edema.   ?Gastrointestinal system: soft, epigastric tenderness improved, non-distended. ?Central nervous system: A&O x4. no gross focal neurologic deficits, normal speech ?Extremities: moves all, no edema, normal tone ?Skin: dry, intact, no rashes seen on visualized skin ?Psychiatry: normal mood, congruent affect, judgement and insight appear normal ? ? ? ?Data Reviewed: ? ?Labs notable for TG's 2813, Na 124, Cl 87 , glucose 119, BUN 5, Ca 8.6, Alkphos 137, albumin 3.3, ALT 293  ?(Some values not in results, not processed - K, AST, Tbili) ? ?Family Communication: none ? ?Disposition: ?Status is: Inpatient ?Remains inpatient appropriate because: remains on insulin infusion and IV fluids, not tolerating PO intake ? ? Planned Discharge Destination: Home ? ? ? ?  Time spent: 45 minutes ? ?Author: ?Ezekiel Slocumb, DO ?12/22/2021 4:43 PM ? ?For on call review [email or web address].  ?

## 2021-12-23 DIAGNOSIS — K859 Acute pancreatitis without necrosis or infection, unspecified: Secondary | ICD-10-CM | POA: Diagnosis not present

## 2021-12-23 LAB — GLUCOSE, CAPILLARY
Glucose-Capillary: 100 mg/dL — ABNORMAL HIGH (ref 70–99)
Glucose-Capillary: 101 mg/dL — ABNORMAL HIGH (ref 70–99)
Glucose-Capillary: 106 mg/dL — ABNORMAL HIGH (ref 70–99)
Glucose-Capillary: 109 mg/dL — ABNORMAL HIGH (ref 70–99)
Glucose-Capillary: 118 mg/dL — ABNORMAL HIGH (ref 70–99)
Glucose-Capillary: 129 mg/dL — ABNORMAL HIGH (ref 70–99)
Glucose-Capillary: 130 mg/dL — ABNORMAL HIGH (ref 70–99)
Glucose-Capillary: 138 mg/dL — ABNORMAL HIGH (ref 70–99)
Glucose-Capillary: 139 mg/dL — ABNORMAL HIGH (ref 70–99)
Glucose-Capillary: 139 mg/dL — ABNORMAL HIGH (ref 70–99)
Glucose-Capillary: 145 mg/dL — ABNORMAL HIGH (ref 70–99)
Glucose-Capillary: 145 mg/dL — ABNORMAL HIGH (ref 70–99)
Glucose-Capillary: 146 mg/dL — ABNORMAL HIGH (ref 70–99)
Glucose-Capillary: 160 mg/dL — ABNORMAL HIGH (ref 70–99)
Glucose-Capillary: 163 mg/dL — ABNORMAL HIGH (ref 70–99)
Glucose-Capillary: 189 mg/dL — ABNORMAL HIGH (ref 70–99)
Glucose-Capillary: 76 mg/dL (ref 70–99)
Glucose-Capillary: 80 mg/dL (ref 70–99)
Glucose-Capillary: 80 mg/dL (ref 70–99)
Glucose-Capillary: 88 mg/dL (ref 70–99)
Glucose-Capillary: 90 mg/dL (ref 70–99)
Glucose-Capillary: 91 mg/dL (ref 70–99)
Glucose-Capillary: 98 mg/dL (ref 70–99)

## 2021-12-23 LAB — LDL CHOLESTEROL, DIRECT: Direct LDL: UNDETERMINED mg/dL (ref 0–99)

## 2021-12-23 LAB — COMPREHENSIVE METABOLIC PANEL
ALT: 207 U/L — ABNORMAL HIGH (ref 0–44)
AST: 80 U/L — ABNORMAL HIGH (ref 15–41)
Albumin: 3.3 g/dL — ABNORMAL LOW (ref 3.5–5.0)
Alkaline Phosphatase: 127 U/L — ABNORMAL HIGH (ref 38–126)
Anion gap: UNDETERMINED (ref 5–15)
BUN: <5 mg/dL — ABNORMAL LOW (ref 6–20)
CO2: 20 mmol/L — ABNORMAL LOW (ref 22–32)
Calcium: 8.4 mg/dL — ABNORMAL LOW (ref 8.9–10.3)
Chloride: 92 mmol/L — ABNORMAL LOW (ref 98–111)
Creatinine, Ser: 0.73 mg/dL (ref 0.61–1.24)
GFR, Estimated: >60 mL/min (ref >60)
Glucose, Bld: 117 mg/dL — ABNORMAL HIGH (ref 70–99)
Potassium: UNDETERMINED mmol/L (ref 3.5–5.1)
Sodium: UNDETERMINED mmol/L (ref 135–145)
Total Bilirubin: 1.9 mg/dL — ABNORMAL HIGH (ref 0.3–1.2)
Total Protein: UNDETERMINED g/dL (ref 6.5–8.1)

## 2021-12-23 LAB — VITAMIN B12: Vitamin B-12: 362 pg/mL (ref 180–914)

## 2021-12-23 LAB — MAGNESIUM: Magnesium: 2.1 mg/dL (ref 1.7–2.4)

## 2021-12-23 LAB — TRIGLYCERIDES: Triglycerides: 1531 mg/dL — ABNORMAL HIGH (ref <150)

## 2021-12-23 LAB — VITAMIN D 25 HYDROXY (VIT D DEFICIENCY, FRACTURES): Vit D, 25-Hydroxy: 8.4 ng/mL — ABNORMAL LOW (ref 30–100)

## 2021-12-23 LAB — PHOSPHORUS: Phosphorus: 4.5 mg/dL (ref 2.5–4.6)

## 2021-12-23 NOTE — Progress Notes (Signed)
[facility] Progress Note ? ?Patient: Sergio [name]    UUV:[id number]  DOA: 12/19/2021    ? ?Date of Service: the patient was seen and examined on 12/23/2021 ? ?Chief Complaint  ?Patient presents with  ? Abdominal Pain  ? ?Brief hospital course: ?Taken from previous note ?46 year old white male with a history of diabetes type 2, on insulin pump, history of familial hypertriglyceridemia, history of recurrent pancreatitis due to hypertriglyceridemia, OSA on CPAP, hypertension presented to the ER on the evening of 12/19/2021 with 2 to 3 days of worsening abdominal pain not improving with use of oxycodone prescription, nausea and vomiting with low-grade fevers.  Patient undergoes outpatient IV insulin infusions with endocrinology every 2 weeks, had recently missed 2 of those appointments due to outpatient surgeries. ?  ?Evaluation in the ED showed triglycerides above 5000.  He was started on insulin infusion and admitted to stepdown unit. ?  ?3/14: TG's 2813, tried on clear liquids but had worse abdominal pain ?  ? ? ?Assessment and Plan: ?* Acute recurrent pancreatitis - due to hypertriglyceridemia ?Continue insulin infusion in stepdown unit. ?Continue dextrose infusion to prevent hypoglycemia. ?Trend triglycerides, likely DC once <1000. ?Endocrinology follow-up for infusions as scheduled, call if concern for needing to be seen sooner. ?Uses insulin pump at home. ?--PRN IV Dilaudid, transition to PO when able. ?--Antiemetics as needed. ?--Clear liquids (as tolerated), NPO ex sips if abdominal pain worsens. ?  ?3/13: Triglycerides improved to 2480 ?3/14: TG's up slightly 2813 ?3/15 TG 1531 improving  ? ?Hyponatremia ?Na 124 this AM, due to being on dextrose infusions and NPO.   ?S/p NS fluids ?BMP unable to measure sodium due to ? ? ?Pancreatic insufficiency ?Will hold pancreatic enzymes while NPO. Resume when tolerating PO intake. ?  ?Obesity (BMI 30-39.9) ?Body mass index is 36.26 kg/m?[name] ?Complicates overall care  and prognosis.  Recommend lifestyle modifications including physical activity and diet for weight loss and overall long-term health. ?  ?HTN (hypertension) ?BP stable, mildly elevated at times.  ?Appears not on antihypertensives at home.  Monitor BP. ?  ?DM (diabetes mellitus), type 2 with neurological complications ([place]) ?Currently on insulin drip for severe hypertriglyceridemia with associated pancreatitis.  Also on dextrose infusion. ?Monitor CBGs closely. ?  ?Sleep apnea ?Continue home CPAP. ?  ? ? ?Body mass index is 36.26 kg/m?.  ?Interventions: ?  ? ?   ?Diet: CLD ?DVT Prophylaxis: Subcutaneous Lovenox  ? ?Advance goals of care discussion: Full code ? ?Family Communication: family was not present at bedside, at the time of interview.  ?The pt provided permission to discuss medical plan with the family. Opportunity was given to ask question and all questions were answered satisfactorily.  ? ?Disposition:  ?Pt is from Home, admitted with pancreatitis and hypertriglyceridemia, still has elevated triglycerides and he is on insulin IV infusion, which precludes a safe discharge. ?Discharge to home, when clinically stable. ? ?Subjective: No significant overnight events, patient is still having abdominal pain 7/10, unable to tolerate p.o. diet, still on clear liquid diet, does not feel like he can advance it.  Denied any chest pain or palpitation, no any other active issues. ? ? ?Physical Exam: ?General:  alert oriented to time, place, and person.  ?Appear in no distress, affect appropriate ?Eyes: PERRLA ?ENT: Oral Mucosa Clear, moist  ?Neck: no JVD,  ?Cardiovascular: S1 and S2 Present, no Murmur,  ?Respiratory: good respiratory effort, Bilateral Air entry equal and Decreased, no Crackles, no wheezes ?Abdomen: Bowel Sound present, Soft and  generalized tenderness,  ?Skin: No rashes ?Extremities: non Pedal edema, o calf tenderness ?Neurologic: without any new focal findings ?Gait not checked due to patient safety  concerns ? ?Vitals:  ? 12/23/21 0820 12/23/21 1100 12/23/21 1218 12/23/21 1441  ?BP: 136/82 123/74  126/80  ?Pulse: 66 64  69  ?Resp: 11     ?Temp:   98.3 ?F (36.8 ?C)   ?TempSrc:   Oral   ?SpO2: 97% 95%  95%  ?Weight:      ?Height:      ? ? ?Intake/Output Summary (Last 24 hours) at 12/23/2021 1622 ?Last data filed at 12/23/2021 1438 ?Gross per 24 hour  ?Intake 4498.4 ml  ?Output 4495 ml  ?Net 3.4 ml  ? ?Filed Weights  ? 12/19/21 1947  ?Weight: 117.9 kg  ? ? ?Data Reviewed: ?I have personally reviewed and interpreted daily labs, tele strips, imagings as discussed above. ?I reviewed all nursing notes, pharmacy notes, vitals, pertinent old records ?I have discussed plan of care as described above with RN and patient/family. ? ?CBC: ?Recent Labs  ?Lab 12/19/21 ?1953 12/20/21 ?0355  ?WBC 5.8 5.3  ?NEUTROABS 3.4 3.2  ?HGB 16.4 14.8  ?HCT 47.8 43.4  ?MCV 87.2 82.2  ?PLT 242 236  ? ?Basic Metabolic Panel: ?Recent Labs  ?Lab 12/19/21 ?1953 12/20/21 ?0409 12/21/21 ?9741 12/22/21 ?0423 12/23/21 ?6384  ?NA 130* 130* UNABLE TO REPORT DUE TO HEMOLYSIS AND LIPEMIA 124* UNABLE TO REPORT DUE TO HEMOLYSIS AND LIPEMIA  ?K 4.1 4.7 UNABLE TO REPORT DUE TO HEMOLYSIS AND LIPEMIA SPECIMEN HEMOLYZED. HEMOLYSIS MAY AFFECT INTEGRITY OF RESULTS. UNABLE TO REPORT DUE TO HEMOLYSIS AND LIPEMIA  ?CL 95* 95* 87* 87* 92*  ?CO2 20* '23 27 28 '$ 20*  ?GLUCOSE 294* 239* UNABLE TO REPORT DUE TO HEMOLYSIS AND LIPEMIA 119* 117*  ?BUN '10 9 7 '$ 5* <5*  ?CREATININE 0.90 0.80 0.85 0.65 0.73  ?CALCIUM 9.4 9.0 8.4* 8.6* 8.4*  ?MG  --  1.8  --   --  2.1  ?PHOS  --   --   --   --  4.5  ? ? ?Studies: ?No results found.  ?Scheduled Meds: ? Chlorhexidine Gluconate Cloth  6 each Topical Daily  ? enoxaparin (LOVENOX) injection  0.5 mg/kg Subcutaneous Q24H  ? gemfibrozil  600 mg Oral BID AC  ? lisinopril  10 mg Oral Daily  ? omega-3 acid ethyl esters  3 g Oral QHS  ? pregabalin  300 mg Oral QHS  ? rosuvastatin  10 mg Oral q1800  ? sertraline  100 mg Oral Daily  ? sodium  chloride  1 g Oral TID WC  ? tamsulosin  0.4 mg Oral Daily  ? ?Continuous Infusions: ? dextrose 150 mL/hr at 12/23/21 1438  ? insulin 0.2 Units/kg/hr (12/23/21 1438)  ? ?PRN Meds: acetaminophen **OR** acetaminophen, ALPRAZolam, dextrose, HYDROmorphone (DILAUDID) injection, ondansetron (ZOFRAN) IV, prochlorperazine, traZODone ? ?Time spent: 35 minutes ? ?Author: ?Val Riles MD ?[facility] ?12/23/2021 4:22 PM ? ?To reach On-call, see care teams to locate the attending and reach out to them via [email or web address]. ?If 7PM-7AM, please contact night-coverage ?If you still have difficulty reaching the attending provider, please page the [facility] (Director on Call) for [facility] on amion for assistance. ? ?

## 2021-12-23 NOTE — Plan of Care (Signed)
?  Problem: Education: ?Goal: Knowledge of Pancreatitis treatment and prevention will improve ?Outcome: Progressing ?  ?Problem: Clinical Measurements: ?Goal: Ability to maintain clinical measurements within normal limits will improve ?Outcome: Progressing ?  ?Problem: Coping: ?Goal: Level of anxiety will decrease ?Outcome: Progressing ?  ?Problem: Pain Managment: ?Goal: General experience of comfort will improve ?Outcome: Progressing ?  ?Problem: Safety: ?Goal: Ability to remain free from injury will improve ?Outcome: Progressing ?  ?

## 2021-12-23 NOTE — Progress Notes (Signed)
Pt remains on continuous fluids with insulin gtt. Blood sugars experienced a drop at 76 and D10 rate increased. Blood sugars have since rebounded.  ? ?Pt still experiencing acute generalized pain relieved by prn. No complaints at this time, will continue to monitor.  ?

## 2021-12-24 DIAGNOSIS — K859 Acute pancreatitis without necrosis or infection, unspecified: Secondary | ICD-10-CM | POA: Diagnosis not present

## 2021-12-24 LAB — BASIC METABOLIC PANEL
Anion gap: 7 (ref 5–15)
BUN: <5 mg/dL — ABNORMAL LOW (ref 6–20)
CO2: 32 mmol/L (ref 22–32)
Calcium: 8.6 mg/dL — ABNORMAL LOW (ref 8.9–10.3)
Chloride: 94 mmol/L — ABNORMAL LOW (ref 98–111)
Creatinine, Ser: 0.9 mg/dL (ref 0.61–1.24)
GFR, Estimated: >60 mL/min (ref >60)
Glucose, Bld: 134 mg/dL — ABNORMAL HIGH (ref 70–99)
Potassium: 3.4 mmol/L — ABNORMAL LOW (ref 3.5–5.1)
Sodium: 133 mmol/L — ABNORMAL LOW (ref 135–145)

## 2021-12-24 LAB — MAGNESIUM: Magnesium: 2.2 mg/dL (ref 1.7–2.4)

## 2021-12-24 LAB — GLUCOSE, CAPILLARY
Glucose-Capillary: 116 mg/dL — ABNORMAL HIGH (ref 70–99)
Glucose-Capillary: 117 mg/dL — ABNORMAL HIGH (ref 70–99)
Glucose-Capillary: 119 mg/dL — ABNORMAL HIGH (ref 70–99)
Glucose-Capillary: 122 mg/dL — ABNORMAL HIGH (ref 70–99)
Glucose-Capillary: 123 mg/dL — ABNORMAL HIGH (ref 70–99)
Glucose-Capillary: 124 mg/dL — ABNORMAL HIGH (ref 70–99)
Glucose-Capillary: 125 mg/dL — ABNORMAL HIGH (ref 70–99)
Glucose-Capillary: 126 mg/dL — ABNORMAL HIGH (ref 70–99)
Glucose-Capillary: 134 mg/dL — ABNORMAL HIGH (ref 70–99)
Glucose-Capillary: 138 mg/dL — ABNORMAL HIGH (ref 70–99)
Glucose-Capillary: 145 mg/dL — ABNORMAL HIGH (ref 70–99)
Glucose-Capillary: 146 mg/dL — ABNORMAL HIGH (ref 70–99)
Glucose-Capillary: 147 mg/dL — ABNORMAL HIGH (ref 70–99)
Glucose-Capillary: 149 mg/dL — ABNORMAL HIGH (ref 70–99)
Glucose-Capillary: 151 mg/dL — ABNORMAL HIGH (ref 70–99)
Glucose-Capillary: 155 mg/dL — ABNORMAL HIGH (ref 70–99)
Glucose-Capillary: 155 mg/dL — ABNORMAL HIGH (ref 70–99)
Glucose-Capillary: 156 mg/dL — ABNORMAL HIGH (ref 70–99)
Glucose-Capillary: 157 mg/dL — ABNORMAL HIGH (ref 70–99)
Glucose-Capillary: 163 mg/dL — ABNORMAL HIGH (ref 70–99)
Glucose-Capillary: 175 mg/dL — ABNORMAL HIGH (ref 70–99)
Glucose-Capillary: 195 mg/dL — ABNORMAL HIGH (ref 70–99)
Glucose-Capillary: 202 mg/dL — ABNORMAL HIGH (ref 70–99)

## 2021-12-24 LAB — PHOSPHORUS: Phosphorus: 4 mg/dL (ref 2.5–4.6)

## 2021-12-24 LAB — CBC
HCT: 45.1 % (ref 39.0–52.0)
Hemoglobin: 14.5 g/dL (ref 13.0–17.0)
MCH: 26.8 pg (ref 26.0–34.0)
MCHC: 32.2 g/dL (ref 30.0–36.0)
MCV: 83.2 fL (ref 80.0–100.0)
Platelets: 173 10*3/uL (ref 150–400)
RBC: 5.42 MIL/uL (ref 4.22–5.81)
RDW: 15.5 % (ref 11.5–15.5)
WBC: 3.9 10*3/uL — ABNORMAL LOW (ref 4.0–10.5)
nRBC: 0 % (ref 0.0–0.2)

## 2021-12-24 LAB — LIPASE, BLOOD: Lipase: 48 U/L (ref 11–51)

## 2021-12-24 LAB — TRIGLYCERIDES: Triglycerides: 1064 mg/dL — ABNORMAL HIGH (ref <150)

## 2021-12-24 MED ORDER — POTASSIUM CHLORIDE CRYS ER 20 MEQ PO TBCR
40.0000 meq | EXTENDED_RELEASE_TABLET | Freq: Once | ORAL | Status: AC
  Administered 2021-12-24: 40 meq via ORAL
  Filled 2021-12-24: qty 2

## 2021-12-24 NOTE — Progress Notes (Signed)
Per pharmacist Cristie Hem, if pts BS is anywhere between 100-200 I do not need to titrate the D10. If it falls below 100 assess the need for amp of D50. ?

## 2021-12-24 NOTE — Progress Notes (Signed)
Pts 0830 bs check was lower than set goal, per pharmacist (alex) keep D10 at 150 and recheck in 1 hour. Adjusted rate from 125 to 150 per order, will continue to monitor. ?

## 2021-12-24 NOTE — Progress Notes (Signed)
[facility] Progress Note ? ?Patient: Sergio [name]    NID:[id number]  DOA: 12/19/2021    ? ?Date of Service: the patient was seen and examined on 12/24/2021 ? ?Chief Complaint  ?Patient presents with  ? Abdominal Pain  ? ?Brief hospital course: ?Taken from previous note ?46 year old white male with a history of diabetes type 2, on insulin pump, history of familial hypertriglyceridemia, history of recurrent pancreatitis due to hypertriglyceridemia, OSA on CPAP, hypertension presented to the ER on the evening of 12/19/2021 with 2 to 3 days of worsening abdominal pain not improving with use of oxycodone prescription, nausea and vomiting with low-grade fevers.  Patient undergoes outpatient IV insulin infusions with endocrinology every 2 weeks, had recently missed 2 of those appointments due to outpatient surgeries. ?  ?Evaluation in the ED showed triglycerides above 5000.  He was started on insulin infusion and admitted to stepdown unit. ?  ?3/16: TG's 1443 still having abdominal pain unable to tolerate diet ?  ? ? ?Assessment and Plan: ?* Acute recurrent pancreatitis - due to hypertriglyceridemia ?Continue insulin infusion in stepdown unit. ?Continue dextrose infusion to prevent hypoglycemia. ?Trend triglycerides, likely DC once <1000. ?Endocrinology follow-up for infusions as scheduled, call if concern for needing to be seen sooner. ?Uses insulin pump at home. ?--PRN IV Dilaudid, transition to PO when able. ?--Antiemetics as needed. ?--Clear liquids (as tolerated), NPO ex sips if abdominal pain worsens. ?  ?3/13: Triglycerides improved to 2480 ?3/14: TG's up slightly 2813 ?TG 1531 --1064 gradually improving  ? ? ?Hyponatremia ?Na 124 --133 gradually improving ?Continue to monitor BMP daily ? ? ?Hypokalemia, potassium repleted. ?Monitor and replete as needed. ? ? ?Pancreatic insufficiency ?Will hold pancreatic enzymes while NPO. Resume when tolerating PO intake. ?  ?Obesity (BMI 30-39.9) ?Body mass index is 36.26  kg/m?[name] ?Complicates overall care and prognosis.  Recommend lifestyle modifications including physical activity and diet for weight loss and overall long-term health. ?  ?HTN (hypertension) ?BP stable, mildly elevated at times.  ?Appears not on antihypertensives at home.  Monitor BP. ?  ?DM (diabetes mellitus), type 2 with neurological complications ([place]) ?Currently on insulin drip for severe hypertriglyceridemia with associated pancreatitis.  Also on dextrose infusion. ?Patient is on Trulicity at home which can also cause pancreatitis but patient would like to continue after discharge and follow-up with his endocrinologist ?Monitor CBGs closely. ?  ?Sleep apnea ?Continue home CPAP. ?  ?Acute urinary retention, Foley catheter was inserted ?Started Flomax ?DC Foley when patient is more ambulatory ? ?Body mass index is 36.26 kg/m?.  ?Interventions: ?  ? ?   ?Diet: CLD ?DVT Prophylaxis: Subcutaneous Lovenox  ? ?Advance goals of care discussion: Full code ? ?Family Communication: family was not present at bedside, at the time of interview.  ?The pt provided permission to discuss medical plan with the family. Opportunity was given to ask question and all questions were answered satisfactorily.  ? ?Disposition:  ?Pt is from Home, admitted with pancreatitis and hypertriglyceridemia, still has elevated triglycerides and he is on insulin IV infusion, which precludes a safe discharge. ?Discharge to home, when clinically stable. ? ?Subjective: No significant overnight events, patient with complaint of abdominal pain 6/10, still on clear liquid diet, unable to advance due to abdominal pain. ?Patient denied any chest pain or palpitations, no any other active issue ? ? ?Physical Exam: ?General:  alert oriented to time, place, and person.  ?Appear in no distress, affect appropriate ?Eyes: PERRLA ?ENT: Oral Mucosa Clear, moist  ?Neck: no  JVD,  ?Cardiovascular: S1 and S2 Present, no Murmur,  ?Respiratory: good respiratory effort,  Bilateral Air entry equal and Decreased, no Crackles, no wheezes ?Abdomen: Bowel Sound present, Soft and mild generalized tenderness,  ?Skin: No rashes ?Extremities: non Pedal edema, o calf tenderness ?Neurologic: without any new focal findings ?Gait not checked due to patient safety concerns ? ?Vitals:  ? 12/24/21 1035 12/24/21 1200 12/24/21 1300 12/24/21 1400  ?BP: 138/70 131/72 (!) 106/59 (!) 94/52  ?Pulse: 76 71 79 71  ?Resp: '10 19 17 13  '$ ?Temp:      ?TempSrc:      ?SpO2: 96% 96% 94% 96%  ?Weight:      ?Height:      ? ? ?Intake/Output Summary (Last 24 hours) at 12/24/2021 1535 ?Last data filed at 12/24/2021 1232 ?Gross per 24 hour  ?Intake 2840.32 ml  ?Output 5275 ml  ?Net -2434.68 ml  ? ?Filed Weights  ? 12/19/21 1947 12/24/21 0500  ?Weight: 117.9 kg 112 kg  ? ? ?Data Reviewed: ?I have personally reviewed and interpreted daily labs, tele strips, imagings as discussed above. ?I reviewed all nursing notes, pharmacy notes, vitals, pertinent old records ?I have discussed plan of care as described above with RN and patient/family. ? ?CBC: ?Recent Labs  ?Lab 12/19/21 ?1953 12/20/21 ?0409 12/24/21 ?0404  ?WBC 5.8 5.3 3.9*  ?NEUTROABS 3.4 3.2  --   ?HGB 16.4 14.8 14.5  ?HCT 47.8 43.4 45.1  ?MCV 87.2 82.2 83.2  ?PLT 242 236 173  ? ?Basic Metabolic Panel: ?Recent Labs  ?Lab 12/20/21 ?0409 12/21/21 ?4431 12/22/21 ?0423 12/23/21 ?5400 12/24/21 ?0404  ?NA 130* UNABLE TO REPORT DUE TO HEMOLYSIS AND LIPEMIA 124* UNABLE TO REPORT DUE TO HEMOLYSIS AND LIPEMIA 133*  ?K 4.7 UNABLE TO REPORT DUE TO HEMOLYSIS AND LIPEMIA SPECIMEN HEMOLYZED. HEMOLYSIS MAY AFFECT INTEGRITY OF RESULTS. UNABLE TO REPORT DUE TO HEMOLYSIS AND LIPEMIA 3.4*  ?CL 95* 87* 87* 92* 94*  ?CO2 '23 27 28 '$ 20* 32  ?GLUCOSE 239* UNABLE TO REPORT DUE TO HEMOLYSIS AND LIPEMIA 119* 117* 134*  ?BUN 9 7 5* <5* <5*  ?CREATININE 0.80 0.85 0.65 0.73 0.90  ?CALCIUM 9.0 8.4* 8.6* 8.4* 8.6*  ?MG 1.8  --   --  2.1 2.2  ?PHOS  --   --   --  4.5 4.0  ? ? ?Studies: ?No results found.   ?Scheduled Meds: ? Chlorhexidine Gluconate Cloth  6 each Topical Daily  ? enoxaparin (LOVENOX) injection  0.5 mg/kg Subcutaneous Q24H  ? gemfibrozil  600 mg Oral BID AC  ? lisinopril  10 mg Oral Daily  ? omega-3 acid ethyl esters  3 g Oral QHS  ? pregabalin  300 mg Oral QHS  ? rosuvastatin  10 mg Oral q1800  ? sertraline  100 mg Oral Daily  ? sodium chloride  1 g Oral TID WC  ? tamsulosin  0.4 mg Oral Daily  ? ?Continuous Infusions: ? dextrose 150 mL/hr at 12/24/21 1348  ? insulin 0.2 Units/kg/hr (12/24/21 1216)  ? ?PRN Meds: acetaminophen **OR** acetaminophen, ALPRAZolam, dextrose, HYDROmorphone (DILAUDID) injection, ondansetron (ZOFRAN) IV, prochlorperazine, traZODone ? ?Time spent: 35 minutes ? ?Author: ?Val Riles MD ?[facility] ?12/24/2021 3:35 PM ? ?To reach On-call, see care teams to locate the attending and reach out to them via [email or web address]. ?If 7PM-7AM, please contact night-coverage ?If you still have difficulty reaching the attending provider, please page the [facility] (Director on Call) for [facility] on amion for assistance. ? ?

## 2021-12-24 NOTE — Plan of Care (Signed)
?  Problem: Education: ?Goal: Knowledge of Pancreatitis treatment and prevention will improve ?Outcome: Progressing ?  ?Problem: Health Behavior/Discharge Planning: ?Goal: Ability to formulate a plan to maintain an alcohol-free life will improve ?Outcome: Progressing ?  ?Problem: Nutritional: ?Goal: Ability to achieve adequate nutritional intake will improve ?Outcome: Progressing ?  ?Problem: Clinical Measurements: ?Goal: Complications related to the disease process, condition or treatment will be avoided or minimized ?Outcome: Progressing ?  ?Problem: Education: ?Goal: Knowledge of General Education information will improve ?Description: Including pain rating scale, medication(s)/side effects and non-pharmacologic comfort measures ?Outcome: Progressing ?  ?Problem: Health Behavior/Discharge Planning: ?Goal: Ability to manage health-related needs will improve ?Outcome: Progressing ?  ?Problem: Clinical Measurements: ?Goal: Ability to maintain clinical measurements within normal limits will improve ?Outcome: Progressing ?Goal: Will remain free from infection ?Outcome: Progressing ?Goal: Diagnostic test results will improve ?Outcome: Progressing ?Goal: Respiratory complications will improve ?Outcome: Progressing ?Goal: Cardiovascular complication will be avoided ?Outcome: Progressing ?  ?Problem: Activity: ?Goal: Risk for activity intolerance will decrease ?Outcome: Progressing ?  ?Problem: Nutrition: ?Goal: Adequate nutrition will be maintained ?Outcome: Progressing ?  ?Problem: Coping: ?Goal: Level of anxiety will decrease ?Outcome: Progressing ?  ?Problem: Elimination: ?Goal: Will not experience complications related to bowel motility ?Outcome: Progressing ?Goal: Will not experience complications related to urinary retention ?Outcome: Progressing ?  ?Problem: Pain Managment: ?Goal: General experience of comfort will improve ?Outcome: Progressing ?  ?Problem: Safety: ?Goal: Ability to remain free from injury will  improve ?Outcome: Progressing ?  ?Problem: Skin Integrity: ?Goal: Risk for impaired skin integrity will decrease ?Outcome: Progressing ?  ?

## 2021-12-24 NOTE — Progress Notes (Signed)
Pts 0930 BS check bs was 116, pharmacist notified and to continue D10 gtt at 150, recheck BS in 1 hour and reassess then. ?

## 2021-12-24 NOTE — Progress Notes (Signed)
Pts 1030 BS came back as 202, message MD and pharmacist, if they wanted me to adjust D10 rate, I was advised by pharmacist just to leave him where he is and recheck in 1 hour. ?

## 2021-12-25 DIAGNOSIS — K859 Acute pancreatitis without necrosis or infection, unspecified: Secondary | ICD-10-CM | POA: Diagnosis not present

## 2021-12-25 LAB — BASIC METABOLIC PANEL
Anion gap: 6 (ref 5–15)
BUN: <5 mg/dL — ABNORMAL LOW (ref 6–20)
CO2: 31 mmol/L (ref 22–32)
Calcium: 8.5 mg/dL — ABNORMAL LOW (ref 8.9–10.3)
Chloride: 95 mmol/L — ABNORMAL LOW (ref 98–111)
Creatinine, Ser: 0.71 mg/dL (ref 0.61–1.24)
GFR, Estimated: >60 mL/min (ref >60)
Glucose, Bld: 156 mg/dL — ABNORMAL HIGH (ref 70–99)
Potassium: 3.5 mmol/L (ref 3.5–5.1)
Sodium: 132 mmol/L — ABNORMAL LOW (ref 135–145)

## 2021-12-25 LAB — CBC
HCT: 45.6 % (ref 39.0–52.0)
Hemoglobin: 14.5 g/dL (ref 13.0–17.0)
MCH: 26.5 pg (ref 26.0–34.0)
MCHC: 31.8 g/dL (ref 30.0–36.0)
MCV: 83.4 fL (ref 80.0–100.0)
Platelets: 165 10*3/uL (ref 150–400)
RBC: 5.47 MIL/uL (ref 4.22–5.81)
RDW: 15.7 % — ABNORMAL HIGH (ref 11.5–15.5)
WBC: 3.8 10*3/uL — ABNORMAL LOW (ref 4.0–10.5)
nRBC: 0 % (ref 0.0–0.2)

## 2021-12-25 LAB — GLUCOSE, CAPILLARY
Glucose-Capillary: 159 mg/dL — ABNORMAL HIGH (ref 70–99)
Glucose-Capillary: 154 mg/dL — ABNORMAL HIGH (ref 70–99)
Glucose-Capillary: 159 mg/dL — ABNORMAL HIGH (ref 70–99)
Glucose-Capillary: 181 mg/dL — ABNORMAL HIGH (ref 70–99)
Glucose-Capillary: 150 mg/dL — ABNORMAL HIGH (ref 70–99)
Glucose-Capillary: 133 mg/dL — ABNORMAL HIGH (ref 70–99)
Glucose-Capillary: 159 mg/dL — ABNORMAL HIGH (ref 70–99)
Glucose-Capillary: 137 mg/dL — ABNORMAL HIGH (ref 70–99)
Glucose-Capillary: 133 mg/dL — ABNORMAL HIGH (ref 70–99)
Glucose-Capillary: 104 mg/dL — ABNORMAL HIGH (ref 70–99)
Glucose-Capillary: 96 mg/dL (ref 70–99)
Glucose-Capillary: 166 mg/dL — ABNORMAL HIGH (ref 70–99)
Glucose-Capillary: 201 mg/dL — ABNORMAL HIGH (ref 70–99)
Glucose-Capillary: 206 mg/dL — ABNORMAL HIGH (ref 70–99)
Glucose-Capillary: 186 mg/dL — ABNORMAL HIGH (ref 70–99)
Glucose-Capillary: 184 mg/dL — ABNORMAL HIGH (ref 70–99)
Glucose-Capillary: 176 mg/dL — ABNORMAL HIGH (ref 70–99)
Glucose-Capillary: 221 mg/dL — ABNORMAL HIGH (ref 70–99)
Glucose-Capillary: 260 mg/dL — ABNORMAL HIGH (ref 70–99)
Glucose-Capillary: 157 mg/dL — ABNORMAL HIGH (ref 70–99)
Glucose-Capillary: 169 mg/dL — ABNORMAL HIGH (ref 70–99)

## 2021-12-25 LAB — MAGNESIUM: Magnesium: 1.9 mg/dL (ref 1.7–2.4)

## 2021-12-25 LAB — LIPASE, BLOOD: Lipase: 52 U/L — ABNORMAL HIGH (ref 11–51)

## 2021-12-25 LAB — PHOSPHORUS: Phosphorus: 4 mg/dL (ref 2.5–4.6)

## 2021-12-25 LAB — TRIGLYCERIDES: Triglycerides: 721 mg/dL — ABNORMAL HIGH (ref <150)

## 2021-12-25 MED ORDER — OXYCODONE HCL 5 MG PO TABS
10.0000 mg | ORAL_TABLET | Freq: Four times a day (QID) | ORAL | Status: DC | PRN
  Administered 2021-12-25 – 2021-12-29 (×12): 10 mg via ORAL
  Filled 2021-12-25 (×12): qty 2

## 2021-12-25 MED ORDER — ENSURE ENLIVE PO LIQD
237.0000 mL | Freq: Three times a day (TID) | ORAL | Status: DC
  Administered 2021-12-25 – 2021-12-26 (×2): 237 mL via ORAL

## 2021-12-25 MED ORDER — HYDROMORPHONE HCL 1 MG/ML IJ SOLN
1.0000 mg | INTRAMUSCULAR | Status: DC | PRN
  Administered 2021-12-25 – 2021-12-28 (×10): 1 mg via INTRAVENOUS
  Filled 2021-12-25 (×10): qty 1

## 2021-12-25 NOTE — Progress Notes (Signed)
[facility] Progress Note ? ?Patient: Sergio [name]    GUY:[id number]  DOA: 12/19/2021    ? ?Date of Service: the patient was seen and examined on 12/25/2021 ? ?Chief Complaint  ?Patient presents with  ? Abdominal Pain  ? ?Brief hospital course: ?Taken from previous note ?46 year old white male with a history of diabetes type 2, on insulin pump, history of familial hypertriglyceridemia, history of recurrent pancreatitis due to hypertriglyceridemia, OSA on CPAP, hypertension presented to the ER on the evening of 12/19/2021 with 2 to 3 days of worsening abdominal pain not improving with use of oxycodone prescription, nausea and vomiting with low-grade fevers.  Patient undergoes outpatient IV insulin infusions with endocrinology every 2 weeks, had recently missed 2 of those appointments due to outpatient surgeries. ?  ?Evaluation in the ED showed triglycerides above 5000.  He was started on insulin infusion and admitted to stepdown unit. ?  ?3/17: TG's 563 still having abdominal pain unable to tolerate diet ?  ? ? ?Assessment and Plan: ?* Acute recurrent pancreatitis - due to hypertriglyceridemia ?Continue insulin infusion in stepdown unit. ?Continue dextrose infusion to prevent hypoglycemia. ?Trend triglycerides, likely DC once <1000. ?Endocrinology follow-up for infusions as scheduled, call if concern for needing to be seen sooner. ?Uses insulin pump at home. ?--PRN IV Dilaudid, transition to PO when able. ?--Antiemetics as needed. ?3/17 advance diet to full liquid ?3/17 started oxycodone 10 mg p.o. every 6 hourly as needed, continue Dilaudid 1 mg Q4 hourly as needed for breakthrough pain, use Tylenol as needed as well ? ?3/13: Triglycerides improved to 2480 ?3/14: TG's up slightly 2813 ?TG 1531 --1064---721  gradually improving  ? ? ?Hyponatremia ?Na 124 --133 gradually improving ?Continue to monitor BMP daily ? ? ?Hypokalemia, potassium repleted. ?Monitor and replete as needed. ? ? ?Pancreatic  insufficiency ?Will hold pancreatic enzymes while NPO. Resume when tolerating PO intake. ?  ?Obesity (BMI 30-39.9) ?Body mass index is 36.26 kg/m?[name] ?Complicates overall care and prognosis.  Recommend lifestyle modifications including physical activity and diet for weight loss and overall long-term health. ?  ?HTN (hypertension) ?BP stable, mildly elevated at times.  ?Appears not on antihypertensives at home.  Monitor BP. ?  ?DM (diabetes mellitus), type 2 with neurological complications ([place]) ?Currently on insulin drip for severe hypertriglyceridemia with associated pancreatitis.  Also on dextrose infusion. ?Patient is on Trulicity at home which can also cause pancreatitis but patient would like to continue after discharge and follow-up with his endocrinologist ?Monitor CBGs closely. ?  ?Sleep apnea ?Continue home CPAP. ?  ?Acute urinary retention, Foley catheter was inserted ?Started Flomax ?DC Foley when patient is more ambulatory ? ?Body mass index is 36.26 kg/m?.  ?Interventions: ?  ? ?   ?Diet: CLD ?DVT Prophylaxis: Subcutaneous Lovenox  ? ?Advance goals of care discussion: Full code ? ?Family Communication: family was not present at bedside, at the time of interview.  ?The pt provided permission to discuss medical plan with the family. Opportunity was given to ask question and all questions were answered satisfactorily.  ? ?Disposition:  ?Pt is from Home, admitted with pancreatitis and hypertriglyceridemia, still has elevated triglycerides and he is on insulin IV infusion, which precludes a safe discharge. ?Discharge to home, when clinically stable. ? ?Subjective: No significant overnight events, patient still having abdominal pain 6/10, not well controlled, we will change pain management as discussed above in the assessment and plan.  Patient is tolerating clear liquid and want to advance to full liquid today.  We  will order Ensure in between meals as well. ?Patient denied any other active  issues ? ? ? ?Physical Exam: ?General:  alert oriented to time, place, and person.  ?Appear in no distress, affect appropriate ?Eyes: PERRLA ?ENT: Oral Mucosa Clear, moist  ?Neck: no JVD,  ?Cardiovascular: S1 and S2 Present, no Murmur,  ?Respiratory: good respiratory effort, Bilateral Air entry equal and Decreased, no Crackles, no wheezes ?Abdomen: Bowel Sound present, Soft and mild generalized tenderness,  ?Skin: No rashes ?Extremities: non Pedal edema, o calf tenderness ?Neurologic: without any new focal findings ?Gait not checked due to patient safety concerns ? ?Vitals:  ? 12/25/21 0900 12/25/21 1000 12/25/21 1100 12/25/21 1200  ?BP: 129/63 128/74 124/81 139/83  ?Pulse: 66 66 75 65  ?Resp: 12 (!) 0 11 (!) 0  ?Temp:  98 ?F (36.7 ?C)  97.9 ?F (36.6 ?C)  ?TempSrc:  Oral  Oral  ?SpO2: 96% 96% 99% 98%  ?Weight:      ?Height:      ? ? ?Intake/Output Summary (Last 24 hours) at 12/25/2021 1503 ?Last data filed at 12/25/2021 1402 ?Gross per 24 hour  ?Intake 5191.03 ml  ?Output 4950 ml  ?Net 241.03 ml  ? ?Filed Weights  ? 12/19/21 1947 12/24/21 0500 12/25/21 0357  ?Weight: 117.9 kg 112 kg 112.2 kg  ? ? ?Data Reviewed: ?I have personally reviewed and interpreted daily labs, tele strips, imagings as discussed above. ?I reviewed all nursing notes, pharmacy notes, vitals, pertinent old records ?I have discussed plan of care as described above with RN and patient/family. ? ?CBC: ?Recent Labs  ?Lab 12/19/21 ?1953 12/20/21 ?0409 12/24/21 ?0404 12/25/21 ?0221  ?WBC 5.8 5.3 3.9* 3.8*  ?NEUTROABS 3.4 3.2  --   --   ?HGB 16.4 14.8 14.5 14.5  ?HCT 47.8 43.4 45.1 45.6  ?MCV 87.2 82.2 83.2 83.4  ?PLT 242 236 173 165  ? ?Basic Metabolic Panel: ?Recent Labs  ?Lab 12/20/21 ?0409 12/21/21 ?8250 12/22/21 ?0423 12/23/21 ?5397 12/24/21 ?0404 12/25/21 ?0221  ?NA 130* UNABLE TO REPORT DUE TO HEMOLYSIS AND LIPEMIA 124* UNABLE TO REPORT DUE TO HEMOLYSIS AND LIPEMIA 133* 132*  ?K 4.7 UNABLE TO REPORT DUE TO HEMOLYSIS AND LIPEMIA SPECIMEN HEMOLYZED.  HEMOLYSIS MAY AFFECT INTEGRITY OF RESULTS. UNABLE TO REPORT DUE TO HEMOLYSIS AND LIPEMIA 3.4* 3.5  ?CL 95* 87* 87* 92* 94* 95*  ?CO2 '23 27 28 '$ 20* 32 31  ?GLUCOSE 239* UNABLE TO REPORT DUE TO HEMOLYSIS AND LIPEMIA 119* 117* 134* 156*  ?BUN 9 7 5* <5* <5* <5*  ?CREATININE 0.80 0.85 0.65 0.73 0.90 0.71  ?CALCIUM 9.0 8.4* 8.6* 8.4* 8.6* 8.5*  ?MG 1.8  --   --  2.1 2.2 1.9  ?PHOS  --   --   --  4.5 4.0 4.0  ? ? ?Studies: ?No results found.  ?Scheduled Meds: ? Chlorhexidine Gluconate Cloth  6 each Topical Daily  ? enoxaparin (LOVENOX) injection  0.5 mg/kg Subcutaneous Q24H  ? feeding supplement  237 mL Oral TID BM  ? gemfibrozil  600 mg Oral BID AC  ? lisinopril  10 mg Oral Daily  ? omega-3 acid ethyl esters  3 g Oral QHS  ? pregabalin  300 mg Oral QHS  ? rosuvastatin  10 mg Oral q1800  ? sertraline  100 mg Oral Daily  ? sodium chloride  1 g Oral TID WC  ? tamsulosin  0.4 mg Oral Daily  ? ?Continuous Infusions: ? dextrose 125 mL/hr at 12/25/21 1402  ? insulin 0.2  Units/kg/hr (12/25/21 1402)  ? ?PRN Meds: acetaminophen **OR** acetaminophen, ALPRAZolam, dextrose, HYDROmorphone (DILAUDID) injection, ondansetron (ZOFRAN) IV, prochlorperazine, traZODone ? ?Time spent: 35 minutes ? ?Author: ?Val Riles MD ?[facility] ?12/25/2021 3:03 PM ? ?To reach On-call, see care teams to locate the attending and reach out to them via [email or web address]. ?If 7PM-7AM, please contact night-coverage ?If you still have difficulty reaching the attending provider, please page the [facility] (Director on Call) for [facility] on amion for assistance. ? ?

## 2021-12-26 DIAGNOSIS — K859 Acute pancreatitis without necrosis or infection, unspecified: Secondary | ICD-10-CM | POA: Diagnosis not present

## 2021-12-26 LAB — GLUCOSE, CAPILLARY
Glucose-Capillary: 110 mg/dL — ABNORMAL HIGH (ref 70–99)
Glucose-Capillary: 123 mg/dL — ABNORMAL HIGH (ref 70–99)
Glucose-Capillary: 127 mg/dL — ABNORMAL HIGH (ref 70–99)
Glucose-Capillary: 141 mg/dL — ABNORMAL HIGH (ref 70–99)
Glucose-Capillary: 141 mg/dL — ABNORMAL HIGH (ref 70–99)
Glucose-Capillary: 150 mg/dL — ABNORMAL HIGH (ref 70–99)
Glucose-Capillary: 158 mg/dL — ABNORMAL HIGH (ref 70–99)
Glucose-Capillary: 171 mg/dL — ABNORMAL HIGH (ref 70–99)
Glucose-Capillary: 193 mg/dL — ABNORMAL HIGH (ref 70–99)
Glucose-Capillary: 207 mg/dL — ABNORMAL HIGH (ref 70–99)
Glucose-Capillary: 210 mg/dL — ABNORMAL HIGH (ref 70–99)
Glucose-Capillary: 239 mg/dL — ABNORMAL HIGH (ref 70–99)

## 2021-12-26 LAB — PHOSPHORUS: Phosphorus: 3.8 mg/dL (ref 2.5–4.6)

## 2021-12-26 LAB — BASIC METABOLIC PANEL
Anion gap: 6 (ref 5–15)
BUN: 6 mg/dL (ref 6–20)
CO2: 30 mmol/L (ref 22–32)
Calcium: 9 mg/dL (ref 8.9–10.3)
Chloride: 96 mmol/L — ABNORMAL LOW (ref 98–111)
Creatinine, Ser: 0.8 mg/dL (ref 0.61–1.24)
GFR, Estimated: >60 mL/min (ref >60)
Glucose, Bld: 132 mg/dL — ABNORMAL HIGH (ref 70–99)
Potassium: 3.6 mmol/L (ref 3.5–5.1)
Sodium: 132 mmol/L — ABNORMAL LOW (ref 135–145)

## 2021-12-26 LAB — CBC
HCT: 45.2 % (ref 39.0–52.0)
Hemoglobin: 14.1 g/dL (ref 13.0–17.0)
MCH: 26.4 pg (ref 26.0–34.0)
MCHC: 31.2 g/dL (ref 30.0–36.0)
MCV: 84.5 fL (ref 80.0–100.0)
Platelets: 168 10*3/uL (ref 150–400)
RBC: 5.35 MIL/uL (ref 4.22–5.81)
RDW: 15.3 % (ref 11.5–15.5)
WBC: 4.6 10*3/uL (ref 4.0–10.5)
nRBC: 0 % (ref 0.0–0.2)

## 2021-12-26 LAB — LIPASE, BLOOD: Lipase: 64 U/L — ABNORMAL HIGH (ref 11–51)

## 2021-12-26 LAB — TRIGLYCERIDES
Triglycerides: 680 mg/dL — ABNORMAL HIGH (ref <150)
Triglycerides: 950 mg/dL — ABNORMAL HIGH (ref <150)

## 2021-12-26 LAB — MAGNESIUM: Magnesium: 2 mg/dL (ref 1.7–2.4)

## 2021-12-26 MED ORDER — INSULIN ASPART 100 UNIT/ML IJ SOLN
0.0000 [IU] | Freq: Three times a day (TID) | INTRAMUSCULAR | Status: DC
  Administered 2021-12-26 (×2): 7 [IU] via SUBCUTANEOUS
  Filled 2021-12-26 (×2): qty 1

## 2021-12-26 MED ORDER — PANCRELIPASE (LIP-PROT-AMYL) 36000-114000 UNITS PO CPEP
36000.0000 [IU] | ORAL_CAPSULE | Freq: Three times a day (TID) | ORAL | Status: DC
  Administered 2021-12-26 – 2021-12-29 (×9): 36000 [IU] via ORAL
  Filled 2021-12-26 (×10): qty 1

## 2021-12-26 NOTE — Progress Notes (Signed)
Patient alert and oriented x4, room air, NSR, afebrile. Patients triglycerides at goal, insulin discontinued per MD. Mosetta Anis given to floor RN. ?

## 2021-12-26 NOTE — Progress Notes (Signed)
[facility] Progress Note ? ?Patient: Sergio [name]    BUL:[id number]  DOA: 12/19/2021    ? ?Date of Service: the patient was seen and examined on 12/26/2021 ? ?Chief Complaint  ?Patient presents with  ? Abdominal Pain  ? ?Brief hospital course: ?Taken from previous note ?46 year old white male with a history of diabetes type 2, on insulin pump, history of familial hypertriglyceridemia, history of recurrent pancreatitis due to hypertriglyceridemia, OSA on CPAP, hypertension presented to the ER on the evening of 12/19/2021 with 2 to 3 days of worsening abdominal pain not improving with use of oxycodone prescription, nausea and vomiting with low-grade fevers.  Patient undergoes outpatient IV insulin infusions with endocrinology every 2 weeks, had recently missed 2 of those appointments due to outpatient surgeries. ?  ?Evaluation in the ED showed triglycerides above 5000.  He was started on insulin infusion and admitted to stepdown unit. ?  ?3/18: TG's 680, c/o abdominal pain 6/10, tolerating diet, ?  ? ? ?Assessment and Plan: ?* Acute recurrent pancreatitis - due to hypertriglyceridemia ?S/p Insulin infusion and IV dextrose given in stepdown unit, d/c on 3/18 ?Trend triglycerides 680 on 3/18 ?Endocrinology follow-up for infusions as scheduled, call if concern for needing to be seen sooner. ?Uses insulin pump at home. ?--PRN IV Dilaudid, transition to PO when able. ?--Antiemetics as needed. ?3/18 advanced to soft diet today ?3/17 started oxycodone 10 mg p.o. every 6 hourly as needed, continue Dilaudid 1 mg Q4 hourly as needed for breakthrough pain, use Tylenol as needed as well ?3/13: Triglycerides improved to 2480 ?3/14: TG's up slightly 2813 ?TG 1531 --1064---721--680  gradually improving  ? ? ?Hyponatremia ?Na 124 --133 gradually improving ?Continue to monitor BMP daily ? ? ?Hypokalemia, potassium repleted. ?Monitor and replete as needed. ? ? ?Pancreatic insufficiency ?held pancreatic enzymes since admission,   ?3/18 resumed as we have advance diet ? ?  ?Obesity (BMI 30-39.9) ?Body mass index is 36.26 kg/m?[name] ?Complicates overall care and prognosis.  Recommend lifestyle modifications including physical activity and diet for weight loss and overall long-term health. ?  ?HTN (hypertension) ?BP stable, mildly elevated at times.  ?Appears not on antihypertensives at home.  Monitor BP. ?  ?DM (diabetes mellitus), type 2 with neurological complications ([place]) ?S/p insulin drip with dextrose due to hypertriglyceridemia with associated pancreatitis.   ?Patient is on Trulicity at home which can also cause pancreatitis but patient would like to continue after discharge and follow-up with his endocrinologist ?Monitor CBGs closely. ?3/18 started Humalog sliding scale ?  ?Sleep apnea ?Continue home CPAP. ?  ?Acute urinary retention, Foley catheter was inserted ?Started Flomax ?DC Foley when patient is more ambulatory ? ?Body mass index is 36.26 kg/m?.  ?Interventions: ?  ? ?   ?Diet: CLD ?DVT Prophylaxis: Subcutaneous Lovenox  ? ?Advance goals of care discussion: Full code ? ?Family Communication: family was not present at bedside, at the time of interview.  ?The pt provided permission to discuss medical plan with the family. Opportunity was given to ask question and all questions were answered satisfactorily.  ? ?Disposition:  ?Pt is from Home, admitted with pancreatitis and hypertriglyceridemia, still has elevated triglycerides and he is on insulin IV infusion, which precludes a safe discharge. ?Discharge to home, when clinically stable. ? ?Subjective: No significant overnight events, patient is feeling improvement in the pain, still complaining of 6/10, tolerating diet well, would like to advance to soft diet today.  Denied any nausea vomiting.  Denied any chest pain or palpitation, no  shortness of breath. ? ?Physical Exam: ?General:  alert oriented to time, place, and person.  ?Appear in no distress, affect appropriate ?Eyes:  PERRLA ?ENT: Oral Mucosa Clear, moist  ?Neck: no JVD,  ?Cardiovascular: S1 and S2 Present, no Murmur,  ?Respiratory: good respiratory effort, Bilateral Air entry equal and Decreased, no Crackles, no wheezes ?Abdomen: Bowel Sound present, Soft and mild generalized tenderness,  ?Skin: No rashes ?Extremities: non Pedal edema, o calf tenderness ?Neurologic: without any new focal findings ?Gait not checked due to patient safety concerns ? ?Vitals:  ? 12/26/21 0800 12/26/21 0900 12/26/21 1000 12/26/21 1131  ?BP: 113/64 120/73 128/75 132/78  ?Pulse: 65 70 78 74  ?Resp: 16 (!) '2 14 18  '$ ?Temp: 98 ?F (36.7 ?C)   98 ?F (36.7 ?C)  ?TempSrc: Oral   Oral  ?SpO2: 99% 97% 96% 97%  ?Weight:      ?Height:      ? ? ?Intake/Output Summary (Last 24 hours) at 12/26/2021 1436 ?Last data filed at 12/26/2021 1100 ?Gross per 24 hour  ?Intake 3138.02 ml  ?Output 7550 ml  ?Net -4411.98 ml  ? ?Filed Weights  ? 12/19/21 1947 12/24/21 0500 12/25/21 0357  ?Weight: 117.9 kg 112 kg 112.2 kg  ? ? ?Data Reviewed: ?I have personally reviewed and interpreted daily labs, tele strips, imagings as discussed above. ?I reviewed all nursing notes, pharmacy notes, vitals, pertinent old records ?I have discussed plan of care as described above with RN and patient/family. ? ?CBC: ?Recent Labs  ?Lab 12/19/21 ?1953 12/20/21 ?0409 12/24/21 ?0404 12/25/21 ?0221 12/26/21 ?4917  ?WBC 5.8 5.3 3.9* 3.8* 4.6  ?NEUTROABS 3.4 3.2  --   --   --   ?HGB 16.4 14.8 14.5 14.5 14.1  ?HCT 47.8 43.4 45.1 45.6 45.2  ?MCV 87.2 82.2 83.2 83.4 84.5  ?PLT 242 236 173 165 168  ? ?Basic Metabolic Panel: ?Recent Labs  ?Lab 12/20/21 ?0409 12/21/21 ?9150 12/22/21 ?0423 12/23/21 ?5697 12/24/21 ?0404 12/25/21 ?0221 12/26/21 ?0434  ?NA 130*   < > 124* UNABLE TO REPORT DUE TO HEMOLYSIS AND LIPEMIA 133* 132* 132*  ?K 4.7   < > SPECIMEN HEMOLYZED. HEMOLYSIS MAY AFFECT INTEGRITY OF RESULTS. UNABLE TO REPORT DUE TO HEMOLYSIS AND LIPEMIA 3.4* 3.5 3.6  ?CL 95*   < > 87* 92* 94* 95* 96*  ?CO2 23   < > 28  20* 32 31 30  ?GLUCOSE 239*   < > 119* 117* 134* 156* 132*  ?BUN 9   < > 5* <5* <5* <5* 6  ?CREATININE 0.80   < > 0.65 0.73 0.90 0.71 0.80  ?CALCIUM 9.0   < > 8.6* 8.4* 8.6* 8.5* 9.0  ?MG 1.8  --   --  2.1 2.2 1.9 2.0  ?PHOS  --   --   --  4.5 4.0 4.0 3.8  ? < > = values in this interval not displayed.  ? ? ?Studies: ?No results found.  ?Scheduled Meds: ? Chlorhexidine Gluconate Cloth  6 each Topical Daily  ? enoxaparin (LOVENOX) injection  0.5 mg/kg Subcutaneous Q24H  ? feeding supplement  237 mL Oral TID BM  ? gemfibrozil  600 mg Oral BID AC  ? insulin aspart  0-20 Units Subcutaneous TID WC  ? lisinopril  10 mg Oral Daily  ? omega-3 acid ethyl esters  3 g Oral QHS  ? pregabalin  300 mg Oral QHS  ? rosuvastatin  10 mg Oral q1800  ? sertraline  100 mg Oral Daily  ?  sodium chloride  1 g Oral TID WC  ? tamsulosin  0.4 mg Oral Daily  ? ?Continuous Infusions: ? ? ?PRN Meds: acetaminophen **OR** acetaminophen, ALPRAZolam, dextrose, HYDROmorphone (DILAUDID) injection, ondansetron (ZOFRAN) IV, oxyCODONE, prochlorperazine, traZODone ? ?Time spent: 35 minutes ? ?Author: ?Val Riles MD ?[facility] ?12/26/2021 2:36 PM ? ?To reach On-call, see care teams to locate the attending and reach out to them via [email or web address]. ?If 7PM-7AM, please contact night-coverage ?If you still have difficulty reaching the attending provider, please page the [facility] (Director on Call) for [facility] on amion for assistance. ? ?

## 2021-12-27 DIAGNOSIS — K859 Acute pancreatitis without necrosis or infection, unspecified: Secondary | ICD-10-CM | POA: Diagnosis not present

## 2021-12-27 LAB — BASIC METABOLIC PANEL
Anion gap: 8 (ref 5–15)
BUN: 11 mg/dL (ref 6–20)
CO2: 26 mmol/L (ref 22–32)
Calcium: 9.2 mg/dL (ref 8.9–10.3)
Chloride: 97 mmol/L — ABNORMAL LOW (ref 98–111)
Creatinine, Ser: 0.75 mg/dL (ref 0.61–1.24)
GFR, Estimated: >60 mL/min (ref >60)
Glucose, Bld: 203 mg/dL — ABNORMAL HIGH (ref 70–99)
Potassium: 4.4 mmol/L (ref 3.5–5.1)
Sodium: 131 mmol/L — ABNORMAL LOW (ref 135–145)

## 2021-12-27 LAB — GLUCOSE, CAPILLARY
Glucose-Capillary: 181 mg/dL — ABNORMAL HIGH (ref 70–99)
Glucose-Capillary: 220 mg/dL — ABNORMAL HIGH (ref 70–99)
Glucose-Capillary: 202 mg/dL — ABNORMAL HIGH (ref 70–99)
Glucose-Capillary: 148 mg/dL — ABNORMAL HIGH (ref 70–99)
Glucose-Capillary: 127 mg/dL — ABNORMAL HIGH (ref 70–99)
Glucose-Capillary: 136 mg/dL — ABNORMAL HIGH (ref 70–99)
Glucose-Capillary: 101 mg/dL — ABNORMAL HIGH (ref 70–99)
Glucose-Capillary: 106 mg/dL — ABNORMAL HIGH (ref 70–99)
Glucose-Capillary: 108 mg/dL — ABNORMAL HIGH (ref 70–99)
Glucose-Capillary: 101 mg/dL — ABNORMAL HIGH (ref 70–99)
Glucose-Capillary: 107 mg/dL — ABNORMAL HIGH (ref 70–99)
Glucose-Capillary: 118 mg/dL — ABNORMAL HIGH (ref 70–99)
Glucose-Capillary: 135 mg/dL — ABNORMAL HIGH (ref 70–99)
Glucose-Capillary: 160 mg/dL — ABNORMAL HIGH (ref 70–99)

## 2021-12-27 LAB — CBC
HCT: 46.4 % (ref 39.0–52.0)
Hemoglobin: 14.6 g/dL (ref 13.0–17.0)
MCH: 26.2 pg (ref 26.0–34.0)
MCHC: 31.5 g/dL (ref 30.0–36.0)
MCV: 83.3 fL (ref 80.0–100.0)
Platelets: 175 10*3/uL (ref 150–400)
RBC: 5.57 MIL/uL (ref 4.22–5.81)
RDW: 15.1 % (ref 11.5–15.5)
WBC: 5.4 10*3/uL (ref 4.0–10.5)
nRBC: 0 % (ref 0.0–0.2)

## 2021-12-27 LAB — TRIGLYCERIDES: Triglycerides: 1094 mg/dL — ABNORMAL HIGH (ref <150)

## 2021-12-27 LAB — LIPASE, BLOOD: Lipase: 84 U/L — ABNORMAL HIGH (ref 11–51)

## 2021-12-27 MED ORDER — DEXTROSE 10 % IV SOLN: INTRAVENOUS | Status: DC

## 2021-12-27 MED ORDER — INSULIN (MYXREDLIN) INFUSION FOR HYPERTRIGLYCERIDEMIA
0.1000 [IU]/kg/h | INTRAVENOUS | Status: DC
  Administered 2021-12-27 – 2021-12-29 (×6): 0.1 [IU]/kg/h via INTRAVENOUS
  Filled 2021-12-27 (×6): qty 100

## 2021-12-27 MED ORDER — DEXTROSE 5 % IV SOLN: INTRAVENOUS | Status: DC

## 2021-12-27 NOTE — Progress Notes (Signed)
[facility] Progress Note ? ?Patient: Sergio [name]    XNT:[id number]  DOA: 12/19/2021    ? ?Date of Service: the patient was seen and examined on 12/27/2021 ? ?Chief Complaint  ?Patient presents with  ? Abdominal Pain  ? ?Brief hospital course: ?Taken from previous note ?46 year old white male with a history of diabetes type 2, on insulin pump, history of familial hypertriglyceridemia, history of recurrent pancreatitis due to hypertriglyceridemia, OSA on CPAP, hypertension presented to the ER on the evening of 12/19/2021 with 2 to 3 days of worsening abdominal pain not improving with use of oxycodone prescription, nausea and vomiting with low-grade fevers.  Patient undergoes outpatient IV insulin infusions with endocrinology every 2 weeks, had recently missed 2 of those appointments due to outpatient surgeries. ?  ?Evaluation in the ED showed triglycerides above 5000.  He was started on insulin infusion and admitted to stepdown unit. ?  ?3/18: TG's 680, c/o abdominal pain 6/10, tolerating diet, ?3/19 1094 again elevated ? ?Assessment and Plan: ?* Acute recurrent pancreatitis - due to hypertriglyceridemia ?S/p Insulin infusion and IV dextrose given in stepdown unit, d/c on 3/18 ?Trend triglycerides 680 on 3/18 ?Endocrinology follow-up for infusions as scheduled, call if concern for needing to be seen sooner. ?Uses insulin pump at home. ?--PRN IV Dilaudid, transition to PO when able. ?--Antiemetics as needed. ?3/18 advanced to soft diet today ?3/17 started oxycodone 10 mg p.o. every 6 hourly as needed, continue Dilaudid 1 mg Q4 hourly as needed for breakthrough pain, use Tylenol as needed as well ?3/13: Triglycerides improved to 2480 ?3/14: TG's up slightly 2813 ?TG 1531 --1064---721--680  gradually improving  ?3/19 TG 1094, but patient's symptoms are improving.  Restarted heparin IV infusion with dextrose infusion to prevent hypoglycemia ?Patient would like to go home tomorrow if symptoms of pancreatitis  improves.  Patient has an appointment with PCP on Wednesday, he gets insulin and dextrose infusion in the office as an outpatient.  Consider discharging him tomorrow if remains stable ? ? ?Hyponatremia ?Na 124 --133--131 gradually improving ?Continue to monitor BMP daily ? ? ?Hypokalemia, potassium repleted. ?Monitor and replete as needed. ? ? ?Pancreatic insufficiency ?held pancreatic enzymes since admission,  ?3/18 resumed as we have advance diet ? ?  ?Obesity (BMI 30-39.9) ?Body mass index is 36.26 kg/m?[name] ?Complicates overall care and prognosis.  Recommend lifestyle modifications including physical activity and diet for weight loss and overall long-term health. ?  ?HTN (hypertension) ?BP stable, mildly elevated at times.  ?Appears not on antihypertensives at home.  Monitor BP. ?  ?DM (diabetes mellitus), type 2 with neurological complications ([place]) ?S/p insulin drip with dextrose due to hypertriglyceridemia with associated pancreatitis.   ?Patient is on Trulicity at home which can also cause pancreatitis but patient would like to continue after discharge and follow-up with his endocrinologist ?Monitor CBGs closely. ?3/18 started Humalog sliding scale ?3/19 restarted insulin IV infusion due to elevated triglyceride level ? ? ?Sleep apnea ?Continue home CPAP. ?  ?Acute urinary retention, Foley catheter was inserted ?Started Flomax ?DC Foley when patient is more ambulatory ?Discontinue Foley catheter tomorrow a.m. before discharge ? ?Body mass index is 36.26 kg/m?.  ?Interventions: ?  ? ?   ?Diet: CLD ?DVT Prophylaxis: Subcutaneous Lovenox  ? ?Advance goals of care discussion: Full code ? ?Family Communication: family was not present at bedside, at the time of interview.  ?The pt provided permission to discuss medical plan with the family. Opportunity was given to ask question and all questions were  answered satisfactorily.  ? ?Disposition:  ?Pt is from Home, admitted with pancreatitis and hypertriglyceridemia,  still has elevated triglycerides and he is on insulin IV infusion, which precludes a safe discharge. ?Discharge to home, when clinically stable. ? ?Subjective: No significant overnight events, abdominal pain 5/10, feels overall improving, able to tolerate diet, denies any nausea vomiting.  No any other active issues. ?Initially patient not wanted to be restarted on insulin infusion as his symptoms are improving but patient was advised to start insulin infusion as triglyceride can go high and symptoms can get worse.  Patient agreed to restart insulin infusion with dextrose but would like to go home tomorrow if symptoms will improve as he has an appointment with PCP on Wednesday. ? ? ?Physical Exam: ?General:  alert oriented to time, place, and person.  ?Appear in no distress, affect appropriate ?Eyes: PERRLA ?ENT: Oral Mucosa Clear, moist  ?Neck: no JVD,  ?Cardiovascular: S1 and S2 Present, no Murmur,  ?Respiratory: good respiratory effort, Bilateral Air entry equal and Decreased, no Crackles, no wheezes ?Abdomen: Bowel Sound present, Soft and mild generalized tenderness,  ?Skin: No rashes ?Extremities: non Pedal edema, o calf tenderness ?Neurologic: without any new focal findings ?Gait not checked due to patient safety concerns ? ?Vitals:  ? 12/27/21 0649 12/27/21 1036 12/27/21 1300 12/27/21 1400  ?BP: 122/88  116/75 122/83  ?Pulse: 74 97 100 99  ?Resp: 18 (!) '9 19 19  '$ ?Temp: 97.6 ?F (36.4 ?C) 98.7 ?F (37.1 ?C)    ?TempSrc: Oral Oral    ?SpO2: 96% 93% 95% 95%  ?Weight:  111.7 kg    ?Height:  '5\' 11"'$  (1.803 m)    ? ? ?Intake/Output Summary (Last 24 hours) at 12/27/2021 1518 ?Last data filed at 12/27/2021 0430 ?Gross per 24 hour  ?Intake 250.44 ml  ?Output 1000 ml  ?Net -749.56 ml  ? ?Filed Weights  ? 12/25/21 0357 12/27/21 0500 12/27/21 1036  ?Weight: 112.2 kg 118.1 kg 111.7 kg  ? ? ?Data Reviewed: ?I have personally reviewed and interpreted daily labs, tele strips, imagings as discussed above. ?I reviewed all nursing  notes, pharmacy notes, vitals, pertinent old records ?I have discussed plan of care as described above with RN and patient/family. ? ?CBC: ?Recent Labs  ?Lab 12/24/21 ?0404 12/25/21 ?0221 12/26/21 ?0434 12/27/21 ?9417  ?WBC 3.9* 3.8* 4.6 5.4  ?HGB 14.5 14.5 14.1 14.6  ?HCT 45.1 45.6 45.2 46.4  ?MCV 83.2 83.4 84.5 83.3  ?PLT 173 165 168 175  ? ?Basic Metabolic Panel: ?Recent Labs  ?Lab 12/23/21 ?4081 12/24/21 ?0404 12/25/21 ?0221 12/26/21 ?0434 12/27/21 ?4481  ?NA UNABLE TO REPORT DUE TO HEMOLYSIS AND LIPEMIA 133* 132* 132* 131*  ?K UNABLE TO REPORT DUE TO HEMOLYSIS AND LIPEMIA 3.4* 3.5 3.6 4.4  ?CL 92* 94* 95* 96* 97*  ?CO2 20* 32 '31 30 26  '$ ?GLUCOSE 117* 134* 156* 132* 203*  ?BUN <5* <5* <5* 6 11  ?CREATININE 0.73 0.90 0.71 0.80 0.75  ?CALCIUM 8.4* 8.6* 8.5* 9.0 9.2  ?MG 2.1 2.2 1.9 2.0  --   ?PHOS 4.5 4.0 4.0 3.8  --   ? ? ?Studies: ?No results found.  ?Scheduled Meds: ? Chlorhexidine Gluconate Cloth  6 each Topical Daily  ? enoxaparin (LOVENOX) injection  0.5 mg/kg Subcutaneous Q24H  ? feeding supplement  237 mL Oral TID BM  ? gemfibrozil  600 mg Oral BID AC  ? lipase/protease/amylase  36,000 Units Oral TID with meals  ? lisinopril  10 mg Oral Daily  ?  omega-3 acid ethyl esters  3 g Oral QHS  ? pregabalin  300 mg Oral QHS  ? rosuvastatin  10 mg Oral q1800  ? sertraline  100 mg Oral Daily  ? sodium chloride  1 g Oral TID WC  ? tamsulosin  0.4 mg Oral Daily  ? ?Continuous Infusions: ? dextrose 50 mL/hr at 12/27/21 1042  ? insulin 0.1 Units/kg/hr (12/27/21 1047)  ? ? ?PRN Meds: acetaminophen **OR** acetaminophen, ALPRAZolam, dextrose, HYDROmorphone (DILAUDID) injection, ondansetron (ZOFRAN) IV, oxyCODONE, prochlorperazine, traZODone ? ?Time spent: 35 minutes ? ?Author: ?Val Riles MD ?[facility] ?12/27/2021 3:18 PM ? ?To reach On-call, see care teams to locate the attending and reach out to them via [email or web address]. ?If 7PM-7AM, please contact night-coverage ?If you still have difficulty reaching the attending  provider, please page the [facility] (Director on Call) for [facility] on amion for assistance. ? ?

## 2021-12-27 NOTE — Plan of Care (Signed)
?  Problem: Education: ?Goal: Knowledge of Pancreatitis treatment and prevention will improve ?Outcome: Progressing ?  ?Problem: Health Behavior/Discharge Planning: ?Goal: Ability to formulate a plan to maintain an alcohol-free life will improve ?Outcome: Progressing ?  ?Problem: Nutritional: ?Goal: Ability to achieve adequate nutritional intake will improve ?Outcome: Progressing ?  ?Problem: Clinical Measurements: ?Goal: Complications related to the disease process, condition or treatment will be avoided or minimized ?Outcome: Progressing ?  ?Problem: Education: ?Goal: Knowledge of General Education information will improve ?Description: Including pain rating scale, medication(s)/side effects and non-pharmacologic comfort measures ?Outcome: Progressing ?  ?Problem: Health Behavior/Discharge Planning: ?Goal: Ability to manage health-related needs will improve ?Outcome: Progressing ?  ?Problem: Clinical Measurements: ?Goal: Ability to maintain clinical measurements within normal limits will improve ?Outcome: Progressing ?Goal: Will remain free from infection ?Outcome: Progressing ?Goal: Diagnostic test results will improve ?Outcome: Progressing ?Goal: Respiratory complications will improve ?Outcome: Progressing ?Goal: Cardiovascular complication will be avoided ?Outcome: Progressing ?  ?Problem: Activity: ?Goal: Risk for activity intolerance will decrease ?Outcome: Progressing ?  ?Problem: Nutrition: ?Goal: Adequate nutrition will be maintained ?Outcome: Progressing ?  ?Problem: Coping: ?Goal: Level of anxiety will decrease ?Outcome: Progressing ?  ?Problem: Elimination: ?Goal: Will not experience complications related to bowel motility ?Outcome: Progressing ?Goal: Will not experience complications related to urinary retention ?Outcome: Progressing ?  ?Problem: Pain Managment: ?Goal: General experience of comfort will improve ?Outcome: Progressing ?  ?Problem: Safety: ?Goal: Ability to remain free from injury will  improve ?Outcome: Progressing ?  ?Problem: Skin Integrity: ?Goal: Risk for impaired skin integrity will decrease ?Outcome: Progressing ?  ?

## 2021-12-28 DIAGNOSIS — K859 Acute pancreatitis without necrosis or infection, unspecified: Secondary | ICD-10-CM | POA: Diagnosis not present

## 2021-12-28 LAB — CBC
HCT: 45.6 % (ref 39.0–52.0)
Hemoglobin: 14.8 g/dL (ref 13.0–17.0)
MCH: 26.1 pg (ref 26.0–34.0)
MCHC: 32.5 g/dL (ref 30.0–36.0)
MCV: 80.4 fL (ref 80.0–100.0)
Platelets: 166 10*3/uL (ref 150–400)
RBC: 5.67 MIL/uL (ref 4.22–5.81)
RDW: 14.8 % (ref 11.5–15.5)
WBC: 5.1 10*3/uL (ref 4.0–10.5)
nRBC: 0 % (ref 0.0–0.2)

## 2021-12-28 LAB — GLUCOSE, CAPILLARY
Glucose-Capillary: 189 mg/dL — ABNORMAL HIGH (ref 70–99)
Glucose-Capillary: 162 mg/dL — ABNORMAL HIGH (ref 70–99)
Glucose-Capillary: 161 mg/dL — ABNORMAL HIGH (ref 70–99)
Glucose-Capillary: 162 mg/dL — ABNORMAL HIGH (ref 70–99)
Glucose-Capillary: 166 mg/dL — ABNORMAL HIGH (ref 70–99)
Glucose-Capillary: 132 mg/dL — ABNORMAL HIGH (ref 70–99)
Glucose-Capillary: 142 mg/dL — ABNORMAL HIGH (ref 70–99)
Glucose-Capillary: 171 mg/dL — ABNORMAL HIGH (ref 70–99)
Glucose-Capillary: 144 mg/dL — ABNORMAL HIGH (ref 70–99)
Glucose-Capillary: 141 mg/dL — ABNORMAL HIGH (ref 70–99)
Glucose-Capillary: 126 mg/dL — ABNORMAL HIGH (ref 70–99)
Glucose-Capillary: 131 mg/dL — ABNORMAL HIGH (ref 70–99)
Glucose-Capillary: 147 mg/dL — ABNORMAL HIGH (ref 70–99)
Glucose-Capillary: 239 mg/dL — ABNORMAL HIGH (ref 70–99)
Glucose-Capillary: 100 mg/dL — ABNORMAL HIGH (ref 70–99)
Glucose-Capillary: 210 mg/dL — ABNORMAL HIGH (ref 70–99)
Glucose-Capillary: 290 mg/dL — ABNORMAL HIGH (ref 70–99)
Glucose-Capillary: 259 mg/dL — ABNORMAL HIGH (ref 70–99)
Glucose-Capillary: 216 mg/dL — ABNORMAL HIGH (ref 70–99)
Glucose-Capillary: 153 mg/dL — ABNORMAL HIGH (ref 70–99)

## 2021-12-28 LAB — BASIC METABOLIC PANEL
Anion gap: 5 (ref 5–15)
BUN: 15 mg/dL (ref 6–20)
CO2: 30 mmol/L (ref 22–32)
Calcium: 8.9 mg/dL (ref 8.9–10.3)
Chloride: 92 mmol/L — ABNORMAL LOW (ref 98–111)
Creatinine, Ser: 0.92 mg/dL (ref 0.61–1.24)
GFR, Estimated: >60 mL/min (ref >60)
Glucose, Bld: 140 mg/dL — ABNORMAL HIGH (ref 70–99)
Potassium: 4.2 mmol/L (ref 3.5–5.1)
Sodium: 127 mmol/L — ABNORMAL LOW (ref 135–145)

## 2021-12-28 LAB — TRIGLYCERIDES: Triglycerides: 680 mg/dL — ABNORMAL HIGH (ref <150)

## 2021-12-28 LAB — LIPASE, BLOOD: Lipase: 48 U/L (ref 11–51)

## 2021-12-28 LAB — OSMOLALITY: Osmolality: 280 mOsm/kg (ref 275–295)

## 2021-12-28 LAB — SODIUM, URINE, RANDOM: Sodium, Ur: 14 mmol/L

## 2021-12-28 LAB — PHOSPHORUS: Phosphorus: 5.2 mg/dL — ABNORMAL HIGH (ref 2.5–4.6)

## 2021-12-28 LAB — OSMOLALITY, URINE: Osmolality, Ur: 152 mOsm/kg — ABNORMAL LOW (ref 300–900)

## 2021-12-28 LAB — MAGNESIUM: Magnesium: 1.9 mg/dL (ref 1.7–2.4)

## 2021-12-28 MED ORDER — SODIUM CHLORIDE 0.9 % IV SOLN: INTRAVENOUS | Status: AC

## 2021-12-28 NOTE — Progress Notes (Signed)
?Progress Note ? ? ?Patient: Sergio [name] HER:[id number] DOB: 09-28-1976 DOA: 12/19/2021     9 ?DOS: the patient was seen and examined on 12/28/2021 ?  ?Brief hospital course: ?46 year old white male with a history of diabetes type 2, on insulin pump, history of familial hypertriglyceridemia, history of recurrent pancreatitis due to hypertriglyceridemia, OSA on CPAP, hypertension presented to the ER on the evening of 12/19/2021 with 2 to 3 days of worsening abdominal pain not improving with use of oxycodone prescription, nausea and vomiting with low-grade fevers.  Patient undergoes outpatient IV insulin infusions with endocrinology every 2 weeks, had recently missed 2 of those appointments due to outpatient surgeries. ? ?Evaluation in the ED showed triglycerides above 5000.  He was started on insulin infusion and admitted to stepdown unit. ? ?3/14: TG's 2813, tried on clear liquids but had worse abdominal pain. ? ?3/18: TG's 680, c/o abdominal pain 6/10, tolerating diet, ?3/19 1094 again elevated. ? ?3/20: I took over care today.  No nausea or vomiting.  He was very concerned of increasing triglyceride with diet.  Advised to resume diet and follow-up with weekly insulin infusion as an outpatient.  Triglyceride 680 today. ?Also developed hyponatremia with sodium of 127, most likely secondary to poor p.o. intake as urinary sodium low at 14, hypotonic urine and normal serum osmolality.  Starting him on normal saline. ? ? ? ?Assessment and Plan: ?* Acute recurrent pancreatitis - due to hypertriglyceridemia ?Continue insulin infusion in stepdown unit. ?Continue dextrose infusion to prevent hypoglycemia. ?Trend triglycerides, likely DC tomorrow if remain below the 1000, today at 680. ?Endocrinology follow-up for infusions as scheduled, call if concern for needing to be seen sooner. ?Uses insulin pump at home. ?--PRN IV Dilaudid, transition to PO when able. ?--Antiemetics as needed. ?-- Start him on diet ? ?3/13:  Triglycerides improved to 2480 ?3/14: TG's up slightly 2813 ? ? ?DM (diabetes mellitus), type 2 with neurological complications ([place]) ?Currently on insulin drip for severe hypertriglyceridemia with associated pancreatitis.  Also on dextrose infusion. ?Monitor CBGs closely. ? ?HTN (hypertension) ?BP stable, mildly elevated at times.  ?Appears not on antihypertensives at home.  Monitor BP. ? ?Hyponatremia ?Sodium again decreased to 127, most likely being on D10 and poor p.o. intake as hyponatremia labs with hypotonic urine, low sodium and normal serum osmolality. ?-Start him on normal saline ?-Monitor sodium   ? ?Pancreatic insufficiency ?- Continue with [place] ? ?Sleep apnea ?-Continue home CPAP. ? ?Obesity (BMI 30-39.9) ?Body mass index is 36.26 kg/m?[name] ?Complicates overall care and prognosis.  Recommend lifestyle modifications including physical activity and diet for weight loss and overall long-term health. ? ?  ? ?Subjective: Patient was seen and examined today.  Denies any pain, nausea or vomiting.  He has not started the diet yet, stating that he is afraid that his triglycerides will go up.  Encouraged to start diet as he will need outpatient monitoring and insulin infusion which he was getting it before. ? ?Physical Exam: ?Vitals:  ? 12/28/21 0800 12/28/21 0900 12/28/21 1000 12/28/21 1100  ?BP: 127/90 116/84 (!) 106/57 126/87  ?Pulse: 70 66 65 66  ?Resp: 18     ?Temp:      ?TempSrc:      ?SpO2: 97% 97% 96% 94%  ?Weight:      ?Height:      ? ?General.  Well-developed, obese gentleman, in no acute distress. ?Pulmonary.  Lungs clear bilaterally, normal respiratory effort. ?CV.  Regular rate and rhythm, no JVD, rub or murmur. ?  Abdomen.  Soft, mild LUQ tenderness, nondistended, BS positive. ?CNS.  Alert and oriented x3.  No focal neurologic deficit. ?Extremities.  No edema, no cyanosis, pulses intact and symmetrical. ?Psychiatry.  Judgment and insight appears normal. ? ?Data Reviewed: ?I personally reviewed prior  notes, labs and images ? ?Family Communication: Discussed with patient ? ?Disposition: ?Status is: Inpatient ?Remains inpatient appropriate because: Severity of illness ? ? Planned Discharge Destination: Home ? ?DVT prophylaxis.  SCDs ? ?Time spent: 50 minutes ? ?This record has been created using Systems analyst. Errors have been sought and corrected,but may not always be located. Such creation errors do not reflect on the standard of care. ? ?Author: ?Lorella Nimrod, MD ?12/28/2021 4:38 PM ? ?For on call review [email or web address].  ?

## 2021-12-29 DIAGNOSIS — K859 Acute pancreatitis without necrosis or infection, unspecified: Secondary | ICD-10-CM | POA: Diagnosis not present

## 2021-12-29 LAB — GLUCOSE, CAPILLARY
Glucose-Capillary: 119 mg/dL — ABNORMAL HIGH (ref 70–99)
Glucose-Capillary: 174 mg/dL — ABNORMAL HIGH (ref 70–99)
Glucose-Capillary: 203 mg/dL — ABNORMAL HIGH (ref 70–99)
Glucose-Capillary: 150 mg/dL — ABNORMAL HIGH (ref 70–99)
Glucose-Capillary: 133 mg/dL — ABNORMAL HIGH (ref 70–99)
Glucose-Capillary: 121 mg/dL — ABNORMAL HIGH (ref 70–99)
Glucose-Capillary: 103 mg/dL — ABNORMAL HIGH (ref 70–99)
Glucose-Capillary: 106 mg/dL — ABNORMAL HIGH (ref 70–99)
Glucose-Capillary: 104 mg/dL — ABNORMAL HIGH (ref 70–99)

## 2021-12-29 LAB — TRIGLYCERIDES: Triglycerides: 770 mg/dL — ABNORMAL HIGH (ref <150)

## 2021-12-29 LAB — CBC
HCT: 45.2 % (ref 39.0–52.0)
Hemoglobin: 14.8 g/dL (ref 13.0–17.0)
MCH: 26.4 pg (ref 26.0–34.0)
MCHC: 32.7 g/dL (ref 30.0–36.0)
MCV: 80.6 fL (ref 80.0–100.0)
Platelets: 159 10*3/uL (ref 150–400)
RBC: 5.61 MIL/uL (ref 4.22–5.81)
RDW: 14.6 % (ref 11.5–15.5)
WBC: 5.4 10*3/uL (ref 4.0–10.5)
nRBC: 0 % (ref 0.0–0.2)

## 2021-12-29 LAB — BASIC METABOLIC PANEL
Anion gap: 8 (ref 5–15)
BUN: 11 mg/dL (ref 6–20)
CO2: 28 mmol/L (ref 22–32)
Calcium: 8.7 mg/dL — ABNORMAL LOW (ref 8.9–10.3)
Chloride: 92 mmol/L — ABNORMAL LOW (ref 98–111)
Creatinine, Ser: 0.9 mg/dL (ref 0.61–1.24)
GFR, Estimated: >60 mL/min (ref >60)
Glucose, Bld: 114 mg/dL — ABNORMAL HIGH (ref 70–99)
Potassium: 3.9 mmol/L (ref 3.5–5.1)
Sodium: 128 mmol/L — ABNORMAL LOW (ref 135–145)

## 2021-12-29 LAB — LIPASE, BLOOD: Lipase: 58 U/L — ABNORMAL HIGH (ref 11–51)

## 2021-12-29 LAB — SODIUM: Sodium: 130 mmol/L — ABNORMAL LOW (ref 135–145)

## 2021-12-29 MED ORDER — ROSUVASTATIN CALCIUM 20 MG PO TABS: 20.0000 mg | ORAL_TABLET | Freq: Every day | ORAL | 1 refills | Status: DC

## 2021-12-29 MED ORDER — GEMFIBROZIL 600 MG PO TABS: 600.0000 mg | ORAL_TABLET | Freq: Two times a day (BID) | ORAL | 2 refills | Status: DC

## 2021-12-29 MED ORDER — OXYCODONE HCL 10 MG PO TABS: 10.0000 mg | ORAL_TABLET | Freq: Four times a day (QID) | ORAL | 0 refills | Status: DC | PRN

## 2021-12-29 MED ORDER — LISINOPRIL 10 MG PO TABS: 10.0000 mg | ORAL_TABLET | Freq: Every day | ORAL | 2 refills | Status: DC

## 2021-12-29 MED ORDER — OMEGA-3-ACID ETHYL ESTERS 1 G PO CAPS
3.0000 g | ORAL_CAPSULE | Freq: Every day | ORAL | 2 refills | Status: DC
Start: 2021-12-29 — End: 2024-04-09

## 2021-12-29 NOTE — Discharge Summary (Signed)
?Physician Discharge Summary ?  ?Patient: Sergio [name] MRN: [id number] DOB: [birth year]  ?Admit date:     12/19/2021  ?Discharge date: 12/29/21  ?Discharge Physician: Lorella Nimrod  ? ?PCP: Peggye Form, NP  ? ?Recommendations at discharge:  ?Please obtain BMP within a week ?Follow-up with primary care provider within a week ? ?Discharge Diagnoses: ?Principal Problem: ?  Acute recurrent pancreatitis - due to hypertriglyceridemia ?Active Problems: ?  DM (diabetes mellitus), type 2 with neurological complications ([place]) ?  HTN (hypertension) ?  Hyponatremia ?  Pancreatic insufficiency ?  Sleep apnea ?  Obesity (BMI 30-39.9) ? ? ?Hospital Course: ?46 year old white male with a history of diabetes type 2, on insulin pump, history of familial hypertriglyceridemia, history of recurrent pancreatitis due to hypertriglyceridemia, OSA on CPAP, hypertension presented to the ER on the evening of 12/19/2021 with 2 to 3 days of worsening abdominal pain not improving with use of oxycodone prescription, nausea and vomiting with low-grade fevers.  Patient undergoes outpatient IV insulin infusions with endocrinology every 2 weeks, had recently missed 2 of those appointments due to outpatient surgeries. ? ?Evaluation in the ED showed triglycerides above 5000.  He was started on insulin infusion and admitted to stepdown unit. ? ?3/14: TG's 2813, tried on clear liquids but had worse abdominal pain. ? ?3/18: TG's 680, c/o abdominal pain 6/10, tolerating diet, ?3/19 1094 again elevated. ? ?3/20: I took over care today.  No nausea or vomiting.  He was very concerned of increasing triglyceride with diet.  Advised to resume diet and follow-up with weekly insulin infusion as an outpatient.  Triglyceride 680 today. ?Also developed hyponatremia with sodium of 127, most likely secondary to continuous use of D10 and poor p.o. intake as urinary sodium low at 14, hypotonic urine and normal serum osmolality.   ? ?Patient started responding to normal  saline after stopping D10.  Triglyceride at 770 on the day of discharge.  Patient was also on Zoloft, dose was increased to 100 mg during current hospitalization, that can also contribute with hyponatremia.  Sodium at 130 on discharge.  He will resume his home dose of Zoloft at 25 mg daily and need to have his sodium levels checked within a week and if remained low then he will get benefit from discontinuing Zoloft.  He was also advised to limit free water intake and can use some added salt. ? ?We increased the dose of Crestor and he will continue his home dose of gemfibrozil and omega-3. ?He was also advised to continue weekly insulin infusions to keep the triglyceride levels under good control. ? ?He was also provided with oxycodone to be used as needed as she continues to have some left upper quadrant pain. ?He will continue use of Creon. ? ?Patient will continue current medications and follow-up with his providers for further recommendations. ? ? ?Assessment and Plan: ?* Acute recurrent pancreatitis - due to hypertriglyceridemia ?Continue insulin infusion in stepdown unit. ?Continue dextrose infusion to prevent hypoglycemia. ?Trend triglycerides, likely DC tomorrow if remain below the 1000, today at 680. ?Endocrinology follow-up for infusions as scheduled, call if concern for needing to be seen sooner. ?Uses insulin pump at home. ?--PRN IV Dilaudid, transition to PO when able. ?--Antiemetics as needed. ?-- Start him on diet ? ?3/13: Triglycerides improved to 2480 ?3/14: TG's up slightly 2813 ? ? ?DM (diabetes mellitus), type 2 with neurological complications ([place]) ?Currently on insulin drip for severe hypertriglyceridemia with associated pancreatitis.  Also on dextrose infusion. ?Monitor CBGs  closely. ? ?HTN (hypertension) ?BP stable, mildly elevated at times.  ?Appears not on antihypertensives at home.  Monitor BP. ? ?Hyponatremia ?Sodium again decreased to 127, most likely being on D10 and poor p.o. intake as  hyponatremia labs with hypotonic urine, low sodium and normal serum osmolality. ?-Start him on normal saline ?-Monitor sodium   ? ?Pancreatic insufficiency ?- Continue with Creon ? ?Sleep apnea ?-Continue home CPAP. ? ?Obesity (BMI 30-39.9) ?Body mass index is 36.26 kg/m?[name] ?Complicates overall care and prognosis.  Recommend lifestyle modifications including physical activity and diet for weight loss and overall long-term health. ? ? ?Consultants: PCCM ?Procedures performed: None ?Disposition: Home ?Diet recommendation:  ?Discharge Diet Orders (From admission, onward)  ? ?  Start     Ordered  ? 12/29/21 0000  Diet - low sodium heart healthy       ? 12/29/21 1318  ? ?  ?  ? ?  ? ?Cardiac diet ?DISCHARGE MEDICATION: ?Allergies as of 12/29/2021   ? ?   Reactions  ? Codeine Anaphylaxis  ? Iodinated Contrast Media Other (See Comments), Anaphylaxis  ? Kidneys stop working ?KIDNEYS SHUT DOWN  ? ?  ? ?  ?Medication List  ?  ? ?STOP taking these medications   ? ?insulin aspart 100 UNIT/ML injection ?Commonly known as: novoLOG ?  ?omeprazole 20 MG capsule ?Commonly known as: PRILOSEC ?  ?Toujeo Max SoloStar 300 UNIT/ML Solostar Pen ?Generic drug: insulin glargine (2 Unit Dial) ?  ?traZODone 100 MG tablet ?Commonly known as: DESYREL ?  ? ?  ? ?TAKE these medications   ? ?acetaminophen 325 MG tablet ?Commonly known as: TYLENOL ?Take 2 tablets (650 mg total) by mouth every 6 (six) hours as needed for mild pain (or Fever >/= 101). ?  ?ALPRAZolam 0.5 MG tablet ?Commonly known as: Duanne Moron ?TAKE 1 TABLET BY MOUTH TWICE DAILY AS NEEDED FOR ANXIETY ?  ?gemfibrozil 600 MG tablet ?Commonly known as: LOPID ?Take 1 tablet (600 mg total) by mouth 2 (two) times daily before a meal. ?  ?lisinopril 10 MG tablet ?Commonly known as: ZESTRIL ?Take 1 tablet (10 mg total) by mouth daily. ?Start taking on: December 30, 2021 ?  ?omega-3 acid ethyl esters 1 g capsule ?Commonly known as: LOVAZA ?Take 3 capsules (3 g total) by mouth at bedtime. ?What changed:  when to take this ?  ?Oxycodone HCl 10 MG Tabs ?Take 1 tablet (10 mg total) by mouth every 6 (six) hours as needed for moderate pain. ?What changed:  ?medication strength ?how much to take ?  ?Pancrelipase (Lip-Prot-Amyl) 40000-126000 units Cpep ?Commonly known as: Zenpep ?Take 1 capsule by mouth 5 (five) times daily. Before meals and  snacks ?  ?pregabalin 300 MG capsule ?Commonly known as: LYRICA ?Take 300 mg by mouth at bedtime. ?  ?rosuvastatin 20 MG tablet ?Commonly known as: CRESTOR ?Take 1 tablet (20 mg total) by mouth daily at 6 PM. ?What changed:  ?medication strength ?how much to take ?  ?sertraline 25 MG tablet ?Commonly known as: ZOLOFT ?Take 25 mg by mouth daily. ?  ?tamsulosin 0.4 MG Caps capsule ?Commonly known as: FLOMAX ?Take 1 capsule (0.4 mg total) by mouth daily. ?  ?Trulicity 1.5 DJ/2.4QA Sopn ?Generic drug: Dulaglutide ?Inject 1.5 mg into the skin every Monday. ?  ? ?  ? ? Follow-up Information   ? ? Fields, Colan Neptune, NP. Schedule an appointment as soon as possible for a visit in 1 week(s).   ?Specialty: Family Medicine ?Contact information: ?[id number] ?[place] [address] ?[phone] ? ? ?  ?  ? ?  ?  ? ?  ? ?Discharge Exam: ?Filed Weights  ? 12/25/21 0357 12/27/21 0500 12/27/21 1036  ?Weight: 112.2 kg 118.1 kg 111.7 kg  ? ?General.     In no acute distress. ?Pulmonary.  Lungs clear bilaterally, normal respiratory effort. ?CV.  Regular rate and rhythm, no JVD, rub or murmur. ?Abdomen.  Soft, nontender, nondistended, BS positive. ?CNS.  Alert and oriented x3.  No focal neurologic deficit. ?Extremities.  No edema, no cyanosis, pulses intact and symmetrical. ?Psychiatry.  Judgment and insight appears normal.  ? ?Condition at discharge: stable ? ?The results of significant diagnostics from this hospitalization (including imaging, microbiology, ancillary and laboratory) are listed below for reference.  ? ?Imaging Studies: ?CT ABDOMEN PELVIS WO CONTRAST ? ?Result Date:  12/19/2021 ?CLINICAL DATA:  Epigastric abdominal pain EXAM: CT ABDOMEN AND PELVIS WITHOUT CONTRAST TECHNIQUE: Multidetector CT imaging of the abdomen and pelvis was performed following the standard protocol without

## 2022-01-04 DIAGNOSIS — R300 Dysuria: Secondary | ICD-10-CM | POA: Diagnosis not present

## 2022-01-04 DIAGNOSIS — J301 Allergic rhinitis due to pollen: Secondary | ICD-10-CM | POA: Diagnosis not present

## 2022-01-04 DIAGNOSIS — K859 Acute pancreatitis without necrosis or infection, unspecified: Secondary | ICD-10-CM | POA: Diagnosis not present

## 2022-01-11 DIAGNOSIS — J301 Allergic rhinitis due to pollen: Secondary | ICD-10-CM | POA: Diagnosis not present

## 2022-01-18 DIAGNOSIS — J301 Allergic rhinitis due to pollen: Secondary | ICD-10-CM | POA: Diagnosis not present

## 2022-01-20 DIAGNOSIS — E1142 Type 2 diabetes mellitus with diabetic polyneuropathy: Secondary | ICD-10-CM | POA: Diagnosis not present

## 2022-01-20 DIAGNOSIS — E1129 Type 2 diabetes mellitus with other diabetic kidney complication: Secondary | ICD-10-CM | POA: Diagnosis not present

## 2022-01-20 DIAGNOSIS — E781 Pure hyperglyceridemia: Secondary | ICD-10-CM | POA: Diagnosis not present

## 2022-01-20 DIAGNOSIS — Z9641 Presence of insulin pump (external) (internal): Secondary | ICD-10-CM | POA: Diagnosis not present

## 2022-01-25 DIAGNOSIS — J301 Allergic rhinitis due to pollen: Secondary | ICD-10-CM | POA: Diagnosis not present

## 2022-01-26 DIAGNOSIS — J301 Allergic rhinitis due to pollen: Secondary | ICD-10-CM | POA: Diagnosis not present

## 2022-02-01 DIAGNOSIS — J301 Allergic rhinitis due to pollen: Secondary | ICD-10-CM | POA: Diagnosis not present

## 2022-02-03 DIAGNOSIS — E781 Pure hyperglyceridemia: Secondary | ICD-10-CM | POA: Diagnosis not present

## 2022-02-18 DIAGNOSIS — E781 Pure hyperglyceridemia: Secondary | ICD-10-CM | POA: Diagnosis not present

## 2022-02-22 DIAGNOSIS — J301 Allergic rhinitis due to pollen: Secondary | ICD-10-CM | POA: Diagnosis not present

## 2022-03-04 DIAGNOSIS — E1165 Type 2 diabetes mellitus with hyperglycemia: Secondary | ICD-10-CM | POA: Diagnosis not present

## 2022-03-04 DIAGNOSIS — R809 Proteinuria, unspecified: Secondary | ICD-10-CM | POA: Diagnosis not present

## 2022-03-04 DIAGNOSIS — Z794 Long term (current) use of insulin: Secondary | ICD-10-CM | POA: Diagnosis not present

## 2022-03-04 DIAGNOSIS — E781 Pure hyperglyceridemia: Secondary | ICD-10-CM | POA: Diagnosis not present

## 2022-03-04 DIAGNOSIS — E1142 Type 2 diabetes mellitus with diabetic polyneuropathy: Secondary | ICD-10-CM | POA: Diagnosis not present

## 2022-03-04 DIAGNOSIS — E1129 Type 2 diabetes mellitus with other diabetic kidney complication: Secondary | ICD-10-CM | POA: Diagnosis not present

## 2022-03-17 ENCOUNTER — Ambulatory Visit (INDEPENDENT_AMBULATORY_CARE_PROVIDER_SITE_OTHER): Payer: 59 | Admitting: Psychiatry

## 2022-03-17 ENCOUNTER — Encounter: Payer: Self-pay | Admitting: Psychiatry

## 2022-03-17 VITALS — BP 160/100 | HR 88 | Temp 97.9°F | Wt 249.0 lb

## 2022-03-17 DIAGNOSIS — F331 Major depressive disorder, recurrent, moderate: Secondary | ICD-10-CM | POA: Diagnosis not present

## 2022-03-17 DIAGNOSIS — F419 Anxiety disorder, unspecified: Secondary | ICD-10-CM

## 2022-03-17 DIAGNOSIS — K760 Fatty (change of) liver, not elsewhere classified: Secondary | ICD-10-CM | POA: Insufficient documentation

## 2022-03-17 MED ORDER — BUPROPION HCL 75 MG PO TABS: 75.0000 mg | ORAL_TABLET | Freq: Every morning | ORAL | 1 refills | Status: DC

## 2022-03-17 NOTE — Patient Instructions (Signed)
[email or web address]  [email or web address]  [facility] - [place] - [phone]   Bupropion Tablets (Depression/Mood Disorders) What is this medication? BUPROPION (byoo PROE pee on) treats depression. It increases norepinephrine and dopamine in the brain, hormones that help regulate mood. It belongs to a group of medications called NDRIs. This medicine may be used for other purposes; ask your health care provider or pharmacist if you have questions. COMMON BRAND NAME(S): Wellbutrin What should I tell my care team before I take this medication? They need to know if you have any of these conditions: An eating disorder, such as anorexia or bulimia Bipolar disorder or psychosis Diabetes or high blood sugar, treated with medication Glaucoma Heart disease, previous heart attack, or irregular heart beat Head injury or brain tumor High blood pressure Kidney or liver disease Seizures Suicidal thoughts or a previous suicide attempt Tourette's syndrome Weight loss An unusual or allergic reaction to bupropion, other medications, foods, dyes, or preservatives Pregnant or trying to become pregnant Breast-feeding How should I use this medication? Take this medication by mouth with a glass of water. Follow the directions on the prescription label. You can take it with or without food. If it upsets your stomach, take it with food. Take your medication at regular intervals. Do not take your medication more often than directed. Do not stop taking this medication suddenly except upon the advice of your care team. Stopping this medication too quickly may cause serious side effects or your condition may worsen. A special MedGuide will be given to you by the pharmacist with each prescription and refill. Be sure to read this information carefully each time. Talk to your care team regarding the use of this medication in children. Special care may be needed. Overdosage: If you  think you have taken too much of this medicine contact a poison control center or emergency room at once. NOTE: This medicine is only for you. Do not share this medicine with others. What if I miss a dose? If you miss a dose, take it as soon as you can. If it is less than four hours to your next dose, take only that dose and skip the missed dose. Do not take double or extra doses. What may interact with this medication? Do not take this medication with any of the following: Linezolid MAOIs like Azilect, Carbex, Eldepryl, Marplan, Nardil, and Parnate Methylene blue (injected into a vein) Other medications that contain bupropion like Zyban This medication may also interact with the following: Alcohol Certain medications for anxiety or sleep Certain medications for blood pressure like metoprolol, propranolol Certain medications for depression or psychotic disturbances Certain medications for HIV or AIDS like efavirenz, lopinavir, nelfinavir, ritonavir Certain medications for irregular heart beat like propafenone, flecainide Certain medications for Parkinson's disease like amantadine, levodopa Certain medications for seizures like carbamazepine, phenytoin, phenobarbital Cimetidine Clopidogrel Cyclophosphamide Digoxin Furazolidone Isoniazid Nicotine Orphenadrine Procarbazine Steroid medications like prednisone or cortisone Stimulant medications for attention disorders, weight loss, or to stay awake Tamoxifen Theophylline Thiotepa Ticlopidine Tramadol Warfarin This list may not describe all possible interactions. Give your health care provider a list of all the medicines, herbs, non-prescription drugs, or dietary supplements you use. Also tell them if you smoke, drink alcohol, or use illegal drugs. Some items may interact with your medicine. What should I watch for while using this medication? Tell your care team if your symptoms do not get better or if they get worse. Visit your care  team for regular checks on  your progress. Because it may take several weeks to see the full effects of this medication, it is important to continue your treatment as prescribed. Watch for new or worsening thoughts of suicide or depression. This includes sudden changes in mood, behavior, or thoughts. These changes can happen at any time but are more common in the beginning of treatment or after a change in dose. Call your care team right away if you experience these thoughts or worsening depression. Manic episodes may happen in patients with bipolar disorder who take this medication. Watch for changes in feelings or behaviors such as feeling anxious, nervous, agitated, panicky, irritable, hostile, aggressive, impulsive, severely restless, overly excited and hyperactive, or trouble sleeping. These symptoms can happen at anytime but are more common in the beginning of treatment or after a change in dose. Call your care team right away if you notice any of these symptoms. This medication may cause serious skin reactions. They can happen weeks to months after starting the medication. Contact your care team right away if you notice fevers or flu-like symptoms with a rash. The rash may be red or purple and then turn into blisters or peeling of the skin. Or, you might notice a red rash with swelling of the face, lips or lymph nodes in your neck or under your arms. Avoid drinks that contain alcohol while taking this medication. Drinking large amounts of alcohol, using sleeping or anxiety medications, or quickly stopping the use of these agents while taking this medication may increase your risk for a seizure. Do not drive or use heavy machinery until you know how this medication affects you. This medication can impair your ability to perform these tasks. Do not take this medication close to bedtime. It may prevent you from sleeping. Your mouth may get dry. Chewing sugarless gum or sucking hard candy, and drinking  plenty of water may help. Contact your care team if the problem does not go away or is severe. What side effects may I notice from receiving this medication? Side effects that you should report to your care team as soon as possible: Allergic reactions--skin rash, itching, hives, swelling of the face, lips, tongue, or throat Increase in blood pressure Mood and behavior changes--anxiety, nervousness, confusion, hallucinations, irritability, hostility, thoughts of suicide or self-harm, worsening mood, feelings of depression Redness, blistering, peeling, or loosening of the skin, including inside the mouth Seizures Sudden eye pain or change in vision such as blurry vision, seeing halos around lights, vision loss Side effects that usually do not require medical attention (report to your care team if they continue or are bothersome): Constipation Dizziness Dry mouth Loss of appetite Nausea Tremors or shaking Trouble sleeping This list may not describe all possible side effects. Call your doctor for medical advice about side effects. You may report side effects to FDA at 1-800-FDA-1088. Where should I keep my medication? Keep out of the reach of children and pets. Store at room temperature between 20 and 25 degrees C (68 and 77 degrees F), away from direct sunlight and moisture. Keep tightly closed. Throw away any unused medication after the expiration date. NOTE: This sheet is a summary. It may not cover all possible information. If you have questions about this medicine, talk to your doctor, pharmacist, or health care provider.  2023 Elsevier/Gold Standard (2020-12-10 00:00:00)
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
## 2022-03-18 DIAGNOSIS — E781 Pure hyperglyceridemia: Secondary | ICD-10-CM | POA: Diagnosis not present

## 2022-04-01 DIAGNOSIS — R809 Proteinuria, unspecified: Secondary | ICD-10-CM | POA: Diagnosis not present

## 2022-04-01 DIAGNOSIS — E781 Pure hyperglyceridemia: Secondary | ICD-10-CM | POA: Diagnosis not present

## 2022-04-01 DIAGNOSIS — E1142 Type 2 diabetes mellitus with diabetic polyneuropathy: Secondary | ICD-10-CM | POA: Diagnosis not present

## 2022-04-01 DIAGNOSIS — Z9641 Presence of insulin pump (external) (internal): Secondary | ICD-10-CM | POA: Diagnosis not present

## 2022-04-01 DIAGNOSIS — E1129 Type 2 diabetes mellitus with other diabetic kidney complication: Secondary | ICD-10-CM | POA: Diagnosis not present

## 2022-04-01 DIAGNOSIS — Z91148 Patient's other noncompliance with medication regimen for other reason: Secondary | ICD-10-CM | POA: Diagnosis not present

## 2022-04-01 DIAGNOSIS — Z794 Long term (current) use of insulin: Secondary | ICD-10-CM | POA: Diagnosis not present

## 2022-04-01 DIAGNOSIS — E1165 Type 2 diabetes mellitus with hyperglycemia: Secondary | ICD-10-CM | POA: Diagnosis not present

## 2022-04-14 ENCOUNTER — Encounter: Payer: Self-pay | Admitting: Psychiatry

## 2022-04-14 ENCOUNTER — Ambulatory Visit (INDEPENDENT_AMBULATORY_CARE_PROVIDER_SITE_OTHER): Payer: 59 | Admitting: Psychiatry

## 2022-04-14 VITALS — BP 142/92 | HR 93 | Ht 70.0 in | Wt 266.0 lb

## 2022-04-14 DIAGNOSIS — F331 Major depressive disorder, recurrent, moderate: Secondary | ICD-10-CM | POA: Diagnosis not present

## 2022-04-14 DIAGNOSIS — R413 Other amnesia: Secondary | ICD-10-CM | POA: Diagnosis not present

## 2022-04-14 DIAGNOSIS — F419 Anxiety disorder, unspecified: Secondary | ICD-10-CM

## 2022-04-14 MED ORDER — TRAZODONE HCL 100 MG PO TABS: 50.0000 mg | ORAL_TABLET | Freq: Every evening | ORAL | 1 refills | Status: AC | PRN

## 2022-04-14 NOTE — Progress Notes (Unsigned)
[facility] MD OP Progress Note  04/14/2022 4:12 PM [name]  MRN:  [id number]  Chief Complaint:  Chief Complaint  Patient presents with   Follow-up: 46 year old male with history of depression, anxiety with possible adverse side effects to Wellbutrin, presented for medication management.   HPI: Sergio [name] is a 46 year old Caucasian male, has a history of MDD, anxiety disorder unspecified, type 2 diabetes, familial hypertriglyceridemia, history of pancreatitis, diabetic neuropathy recently started on Wellbutrin, presented for medication management.  Patient reports since starting the Wellbutrin he has noticed his depressive symptoms as improved.  He feels more motivated and energetic to do things.  He does not feel as sad as he used to feel before.  He reports he is sleeping better at night.  The trazodone does help.    However reports he may have possible adverse effect of Wellbutrin, currently struggles with short-term memory loss.  Patient reports he has noticed this a lot in the past few weeks.  He had not even today.  Patient reports while driving to work he did not know how to get there recently.  This was also brought to his attention by his wife and his father.  Patient however reports he likes the Wellbutrin and is not interested in stopping it yet.  He would like to give it more time.  Denies any significant anxiety symptoms.  A SLUMS was completed in session today.  Patient scored 26 out of 30.  Borderline.  Patient reports however would like to give the Wellbutrin a chance before changing this medication.   Denies any other concerns today.    Visit Diagnosis:    ICD-10-CM   1. MDD (major depressive disorder), recurrent episode, moderate (HCC)  F33.1 traZODone (DESYREL) 100 MG tablet    2. Anxiety disorder, unspecified type  F41.9     3. Memory loss  R41.3       Past Psychiatric History: Reviewed past psychiatric history from progress note on 03/17/2022.  Past trials of  medications like Zoloft, Xanax.  Past Medical History:  Past Medical History:  Diagnosis Date   Anxiety    Chicken pox    Depression    Diabetes mellitus    DM2   Familial hypertriglyceridemia    severe   Gastric ulcer 2009   HTN (hypertension)    Morbid obesity ([place])    s/p bariatric sleeve surgery 01/2015   Recurrent acute pancreatitis    secondary to hypertriglyceridemia    Skin cancer    Sleep apnea 1999   uses CPAP    Past Surgical History:  Procedure Laterality Date   CHOLECYSTECTOMY     LAPAROSCOPIC GASTRIC SLEEVE RESECTION     LUMBAR [place] SURGERY  2008   TONSILLECTOMY  2003   VASECTOMY      Family Psychiatric History: Reviewed family psychiatric history from progress note on 03/17/2022.  Family History:  Family History  Adopted: Yes  Problem Relation Age of Onset   Hypertension Mother    Hyperlipidemia Mother    Hypertension Father    Hyperlipidemia Father     Social History: Reviewed social history from progress note on 03/17/2022. Social History   Socioeconomic History   Marital status: Married    Spouse name: Not on file   Number of children: 3   Years of education: Not on file   Highest education level: Bachelor's degree (e.g., BA, AB, BS)  Occupational History   Occupation: Corporate treasurer  Tobacco Use   Smoking status: Never   Smokeless tobacco: Never  Vaping Use   Vaping Use: Never used  Substance and Sexual Activity   Alcohol use: No   Drug use: No   Sexual activity: Yes    Partners: Male  Other Topics Concern   Not on file  Social History Narrative   Daily Caffeine Use:  2-3 week/Soda   Regular Exercise -  NO   Social Determinants of Health   Financial Resource Strain: Unknown (01/16/2018)   Overall Financial Resource Strain (CARDIA)    Difficulty of Paying Living Expenses: Patient refused  Food Insecurity: Unknown (01/16/2018)   Hunger Vital Sign    Worried About Running Out of  Food in the Last Year: Patient refused    [place] in the Last Year: Patient refused  Transportation Needs: Unknown (01/16/2018)   [place] - Transportation    Lack of Transportation (Medical): Patient refused    Lack of Transportation (Non-Medical): Patient refused  Physical Activity: Unknown (01/16/2018)   Exercise Vital Sign    Days of Exercise per Week: Patient refused    Minutes of Exercise per Session: Patient refused  Stress: Unknown (01/16/2018)   [employer] of Stress : Patient refused  Social Connections: Unknown (01/16/2018)   Social Connection and Isolation Panel [NHANES]    Frequency of Communication with Friends and Family: Patient refused    Frequency of Social Gatherings with Friends and Family: Patient refused    Attends Religious Services: Patient refused    Active Member of Clubs or Organizations: Patient refused    Attends Archivist Meetings: Patient refused    Marital Status: Patient refused    Allergies:  Allergies  Allergen Reactions   Codeine Anaphylaxis   Iodinated Contrast Media Other (See Comments) and Anaphylaxis    Kidneys stop working KIDNEYS SHUT DOWN    Metabolic Disorder Labs: Lab Results  Component Value Date   HGBA1C 6.9 (H) 06/15/2019   MPG 151.33 06/15/2019   MPG 249 01/28/2017   No results found for: "PROLACTIN" Lab Results  Component Value Date   CHOL 698 (H) 12/19/2021   TRIG 770 (H) 12/29/2021   HDL NOT REPORTED DUE TO HIGH TRIGLYCERIDES 12/19/2021   CHOLHDL NOT REPORTED DUE TO HIGH TRIGLYCERIDES 12/19/2021   VLDL UNABLE TO CALCULATE IF TRIGLYCERIDE OVER 400 mg/dL 12/19/2021   LDLCALC UNABLE TO CALCULATE IF TRIGLYCERIDE OVER 400 mg/dL 12/19/2021   LDLCALC UNABLE TO CALCULATE IF TRIGLYCERIDE OVER 400 mg/dL 01/16/2018   Lab Results  Component Value Date   TSH 1.258 12/19/2021   TSH 1.906 06/15/2019    Therapeutic Level Labs: No results  found for: "LITHIUM" No results found for: "VALPROATE" No results found for: "CBMZ"  Current Medications: Current Outpatient Medications  Medication Sig Dispense Refill   acetaminophen (TYLENOL) 325 MG tablet Take 2 tablets (650 mg total) by mouth every 6 (six) hours as needed for mild pain (or Fever >/= 101).     ALPRAZolam (XANAX) 0.5 MG tablet TAKE 1 TABLET BY MOUTH TWICE DAILY AS NEEDED FOR ANXIETY (Patient taking differently: 0.5 mg. Takes rarely as needed) 30 tablet 3   buPROPion (WELLBUTRIN) 75 MG tablet Take 1 tablet (75 mg total) by mouth in the morning. 30 tablet 1   Continuous Blood Gluc Transmit (DEXCOM G6 TRANSMITTER) MISC Use to monitor blood sugar.  Replace every 3 months     gemfibrozil (LOPID) 600 MG  tablet Take 1 tablet (600 mg total) by mouth 2 (two) times daily before a meal. 60 tablet 2   insulin lispro (HUMALOG) 100 UNIT/ML KwikPen Inject 200 Units into the skin. Takes differently     lisinopril (ZESTRIL) 10 MG tablet Take 1 tablet (10 mg total) by mouth daily. 30 tablet 2   omega-3 acid ethyl esters (LOVAZA) 1 g capsule Take 3 capsules (3 g total) by mouth at bedtime. 90 capsule 2   pregabalin (LYRICA) 300 MG capsule Take 300 mg by mouth at bedtime.      rosuvastatin (CRESTOR) 20 MG tablet Take 1 tablet (20 mg total) by mouth daily at 6 PM. 90 tablet 1   traZODone (DESYREL) 100 MG tablet Take 0.5-1 tablets (50-100 mg total) by mouth at bedtime as needed for sleep. 90 tablet 1   TRULICITY 1.5 LF/8.1OF SOPN Inject 1.5 mg into the skin every Monday.   4   No current facility-administered medications for this visit.     Musculoskeletal: Strength & Muscle Tone: within normal limits Gait & Station: normal Patient leans: N/A  Psychiatric Specialty Exam: Review of Systems  Psychiatric/Behavioral:  Positive for dysphoric mood (improving).   All other systems reviewed and are negative.   Blood pressure (!) 142/92, pulse 93, height '5\' 10"'$  (1.778 m), weight 266 lb (120.7  kg).Body mass index is 38.17 kg/m.  General Appearance: Casual  Eye Contact:  Fair  Speech:  Clear and Coherent  Volume:  Normal  Mood:  Depressed improving  Affect:  Congruent  Thought Process:  Goal Directed and Descriptions of Associations: Intact  Orientation:  Full (Time, Place, and Person)  Thought Content: Logical   Suicidal Thoughts:  No  Homicidal Thoughts:  No  Memory:  Immediate;   Fair Recent;   Limited Remote;   Fair  Judgement:  Fair  Insight:  Fair  Psychomotor Activity:  Normal  Concentration:  Concentration: Fair and Attention Span: Fair  Recall:  [employer] of Knowledge: Fair  Language: Fair  Akathisia:  No  Handed:  Right  AIMS (if indicated): not done  Assets:  Communication Skills Desire for Improvement Housing Intimacy Social Support Talents/Skills  ADL's:  Intact  Cognition: WNL  Sleep:  Fair   Screenings: GAD-7    Flowsheet Row Office Visit from 03/17/2022 in [facility]  Total GAD-7 Score 10      PHQ2-9    [place] Visit from 03/17/2022 in [facility] Office Visit from 11/28/2017 in [facility]  PHQ-2 Total Score 3 2  PHQ-9 Total Score 12 5      [place] Office Visit from 03/17/2022 in [facility] to Hosp-Admission (Discharged) from 12/19/2021 in [facility] ICU/CCU  C-SSRS RISK CATEGORY No Risk No Risk        Assessment and Plan: ROBERTO ROMANOSKI is a 46 year old Caucasian male with history of depression, anxiety, diabetes mellitus, neuropathy, hypertriglyceridemia, history of pancreatitis, obstructive sleep apnea on CPAP, history of bariatric surgery was evaluated in office today.  Patient with some improvement with regards to his depression symptoms on the Wellbutrin however with possible side effects of short-term memory loss, will benefit from following plan.  Plan MDD-improving Continue  Wellbutrin 75 mg p.o. daily in the morning.  Patient would like to give this more time.  Will reevaluate in 2 weeks from now. She does have low-dose Xanax from a previous prescription which she takes rarely. Continue  trazodone 50 to 100 mg p.o. nightly as needed for sleep Referred for CBT-pending  Anxiety disorder unspecified-improving Provided CBT resources.  Memory loss-completed SLUMS in session today.  Scored 26 out of 30.  Will monitor this closely.  We will consider changing Wellbutrin to another antidepressant if his memory concerns does not get better.  Patient however likes the Wellbutrin and would like to give it more time.   Follow-up in clinic in 2 weeks or sooner if needed.   This note was generated in part or whole with voice recognition software. Voice recognition is usually quite accurate but there are transcription errors that can and very often do occur. I apologize for any typographical errors that were not detected and corrected.      Ursula Alert, MD 04/14/2022, 4:12 PM

## 2022-04-22 DIAGNOSIS — E781 Pure hyperglyceridemia: Secondary | ICD-10-CM | POA: Diagnosis not present

## 2022-04-22 DIAGNOSIS — E1129 Type 2 diabetes mellitus with other diabetic kidney complication: Secondary | ICD-10-CM | POA: Diagnosis not present

## 2022-04-22 DIAGNOSIS — E1142 Type 2 diabetes mellitus with diabetic polyneuropathy: Secondary | ICD-10-CM | POA: Diagnosis not present

## 2022-04-22 DIAGNOSIS — E1165 Type 2 diabetes mellitus with hyperglycemia: Secondary | ICD-10-CM | POA: Diagnosis not present

## 2022-04-22 DIAGNOSIS — R809 Proteinuria, unspecified: Secondary | ICD-10-CM | POA: Diagnosis not present

## 2022-04-22 DIAGNOSIS — Z9641 Presence of insulin pump (external) (internal): Secondary | ICD-10-CM | POA: Diagnosis not present

## 2022-04-22 DIAGNOSIS — Z794 Long term (current) use of insulin: Secondary | ICD-10-CM | POA: Diagnosis not present

## 2022-04-28 ENCOUNTER — Encounter: Payer: Self-pay | Admitting: Psychiatry

## 2022-04-28 ENCOUNTER — Ambulatory Visit (INDEPENDENT_AMBULATORY_CARE_PROVIDER_SITE_OTHER): Payer: 59 | Admitting: Psychiatry

## 2022-04-28 VITALS — BP 127/87 | HR 89 | Temp 97.8°F | Wt 245.2 lb

## 2022-04-28 DIAGNOSIS — F419 Anxiety disorder, unspecified: Secondary | ICD-10-CM

## 2022-04-28 DIAGNOSIS — F3341 Major depressive disorder, recurrent, in partial remission: Secondary | ICD-10-CM | POA: Diagnosis not present

## 2022-04-28 DIAGNOSIS — R413 Other amnesia: Secondary | ICD-10-CM | POA: Diagnosis not present

## 2022-04-28 MED ORDER — BUPROPION HCL 75 MG PO TABS: 75.0000 mg | ORAL_TABLET | Freq: Every morning | ORAL | 0 refills | Status: DC

## 2022-04-28 NOTE — Progress Notes (Signed)
[facility] MD OP Progress Note  04/28/2022 1:00 PM Sergio [name]  MRN:  [id number]  Chief Complaint:  Chief Complaint  Patient presents with   Follow-up: 46 year old male with history of depression, anxiety, presented for medication management.   HPI: Sergio [name] is a 46 year old Caucasian male, has a history of MDD, anxiety disorder, type 2 diabetes mellitus, family and hypertriglyceridemia, history of pancreatitis, diabetic neuropathy, was evaluated in office today, presented for medication management.  Patient today reports he has noticed that the Wellbutrin has been more helpful.  The side effects that he had has improved, currently does not have any significant short-term memory problems.  Patient reports he feels more motivated and energetic and his depressive symptoms have improved.  Patient reports sleep is good.  He has been using the trazodone only as needed, he has been able to sleep without any sleep medication few nights a week.  That is an improvement.  Patient reports his family has also observed a definite change in his mood.  Patient denies any suicidality, homicidality or perceptual disturbances.  Patient with elevated blood pressure in session today, blood pressure was repeated and it was within normal limits.  Patient denies any other concerns today.  Visit Diagnosis:    ICD-10-CM   1. MDD (major depressive disorder), recurrent, in partial remission (HCC)  F33.41 buPROPion (WELLBUTRIN) 75 MG tablet    2. Anxiety disorder, unspecified type  F41.9     3. Memory loss  R41.3       Past Psychiatric History: Reviewed past psychiatric history from progress note on 03/17/2022.  Past trials of medications like Zoloft, Xanax  Past Medical History:  Past Medical History:  Diagnosis Date   Anxiety    Chicken pox    Depression    Diabetes mellitus    DM2   Familial hypertriglyceridemia    severe   Gastric ulcer 2009   HTN (hypertension)    Morbid obesity ([place])    s/p  bariatric sleeve surgery 01/2015   Recurrent acute pancreatitis    secondary to hypertriglyceridemia    Skin cancer    Sleep apnea 1999   uses CPAP    Past Surgical History:  Procedure Laterality Date   CHOLECYSTECTOMY     LAPAROSCOPIC GASTRIC SLEEVE RESECTION     LUMBAR [place] SURGERY  2008   TONSILLECTOMY  2003   VASECTOMY      Family Psychiatric History: Reviewed family psychiatric history from progress note on 03/17/2022.  Family History:  Family History  Adopted: Yes  Problem Relation Age of Onset   Hypertension Mother    Hyperlipidemia Mother    Hypertension Father    Hyperlipidemia Father     Social History: Reviewed social history from progress note on 03/17/2022. Social History   Socioeconomic History   Marital status: Married    Spouse name: Not on file   Number of children: 3   Years of education: Not on file   Highest education level: Bachelor's degree (e.g., BA, AB, BS)  Occupational History   Occupation: Corporate treasurer  Tobacco Use   Smoking status: Never   Smokeless tobacco: Never  Vaping Use   Vaping Use: Never used  Substance and Sexual Activity   Alcohol use: No   Drug use: No   Sexual activity: Yes    Partners: Male  Other Topics Concern   Not on file  Social History Narrative   Daily Caffeine Use:  2-3  week/Soda   Regular Exercise -  NO   Social Determinants of Health   Financial Resource Strain: Unknown (01/16/2018)   Overall Financial Resource Strain (CARDIA)    Difficulty of Paying Living Expenses: Patient refused  Food Insecurity: Unknown (01/16/2018)   Hunger Vital Sign    Worried About Running Out of Food in the Last Year: Patient refused    [place] in the Last Year: Patient refused  Transportation Needs: Unknown (01/16/2018)   [place] - Hydrologist (Medical): Patient refused    Lack of Transportation (Non-Medical): Patient refused  Physical  Activity: Unknown (01/16/2018)   Exercise Vital Sign    Days of Exercise per Week: Patient refused    Minutes of Exercise per Session: Patient refused  Stress: Unknown (01/16/2018)   [place] of Stress : Patient refused  Social Connections: Unknown (01/16/2018)   Social Connection and Isolation Panel [NHANES]    Frequency of Communication with Friends and Family: Patient refused    Frequency of Social Gatherings with Friends and Family: Patient refused    Attends Religious Services: Patient refused    Active Member of Clubs or Organizations: Patient refused    Attends Archivist Meetings: Patient refused    Marital Status: Patient refused    Allergies:  Allergies  Allergen Reactions   Codeine Anaphylaxis   Iodinated Contrast Media Other (See Comments) and Anaphylaxis    Kidneys stop working KIDNEYS SHUT DOWN    Metabolic Disorder Labs: Lab Results  Component Value Date   HGBA1C 6.9 (H) 06/15/2019   MPG 151.33 06/15/2019   MPG 249 01/28/2017   No results found for: "PROLACTIN" Lab Results  Component Value Date   CHOL 698 (H) 12/19/2021   TRIG 770 (H) 12/29/2021   HDL NOT REPORTED DUE TO HIGH TRIGLYCERIDES 12/19/2021   CHOLHDL NOT REPORTED DUE TO HIGH TRIGLYCERIDES 12/19/2021   VLDL UNABLE TO CALCULATE IF TRIGLYCERIDE OVER 400 mg/dL 12/19/2021   LDLCALC UNABLE TO CALCULATE IF TRIGLYCERIDE OVER 400 mg/dL 12/19/2021   LDLCALC UNABLE TO CALCULATE IF TRIGLYCERIDE OVER 400 mg/dL 01/16/2018   Lab Results  Component Value Date   TSH 1.258 12/19/2021   TSH 1.906 06/15/2019    Therapeutic Level Labs: No results found for: "LITHIUM" No results found for: "VALPROATE" No results found for: "CBMZ"  Current Medications: Current Outpatient Medications  Medication Sig Dispense Refill   acetaminophen (TYLENOL) 325 MG tablet Take 2 tablets (650 mg total) by mouth every 6 (six) hours as needed for  mild pain (or Fever >/= 101).     Continuous Blood Gluc Transmit (DEXCOM G6 TRANSMITTER) MISC Use to monitor blood sugar.  Replace every 3 months     gemfibrozil (LOPID) 600 MG tablet Take 1 tablet (600 mg total) by mouth 2 (two) times daily before a meal. 60 tablet 2   HUMALOG KWIKPEN 200 UNIT/ML KwikPen SMARTSIG:28 Unit(s) SUB-Q Twice Daily     insulin lispro (HUMALOG) 100 UNIT/ML KwikPen Inject 200 Units into the skin. Takes differently     lisinopril (ZESTRIL) 10 MG tablet Take 1 tablet (10 mg total) by mouth daily. 30 tablet 2   omega-3 acid ethyl esters (LOVAZA) 1 g capsule Take 3 capsules (3 g total) by mouth at bedtime. 90 capsule 2   pregabalin (LYRICA) 300 MG capsule Take 300 mg by mouth at bedtime.      rosuvastatin (CRESTOR) 20 MG tablet  Take 1 tablet (20 mg total) by mouth daily at 6 PM. 90 tablet 1   traZODone (DESYREL) 100 MG tablet Take 0.5-1 tablets (50-100 mg total) by mouth at bedtime as needed for sleep. 90 tablet 1   TRULICITY 1.5 DX/4.1OI SOPN Inject 1.5 mg into the skin every Monday.   4   ALPRAZolam (XANAX) 0.5 MG tablet TAKE 1 TABLET BY MOUTH TWICE DAILY AS NEEDED FOR ANXIETY (Patient not taking: Reported on 04/28/2022) 30 tablet 3   buPROPion (WELLBUTRIN) 75 MG tablet Take 1 tablet (75 mg total) by mouth in the morning. 90 tablet 0   No current facility-administered medications for this visit.     Musculoskeletal: Strength & Muscle Tone: within normal limits Gait & Station: normal Patient leans: N/A  Psychiatric Specialty Exam: Review of Systems  Psychiatric/Behavioral:  Positive for dysphoric mood.   All other systems reviewed and are negative.   Blood pressure 127/87, pulse 89, temperature 97.8 F (36.6 C), temperature source Temporal, weight 245 lb 3.2 oz (111.2 kg).Body mass index is 35.18 kg/m.  General Appearance: Casual  Eye Contact:  Good  Speech:  Clear and Coherent  Volume:  Normal  Mood:  Depressed improving  Affect:  Congruent  Thought  Process:  Goal Directed and Descriptions of Associations: Intact  Orientation:  Full (Time, Place, and Person)  Thought Content: Logical   Suicidal Thoughts:  No  Homicidal Thoughts:  No  Memory:  Immediate;   Fair Recent;   Fair Remote;   Fair  Judgement:  Fair  Insight:  Good  Psychomotor Activity:  Normal  Concentration:  Concentration: Fair and Attention Span: Fair  Recall:  [place] of Knowledge: Good  Language: Fair  Akathisia:  No  Handed:  Right  AIMS (if indicated): done  Assets:  Communication Skills Desire for Improvement Housing Intimacy Social Support Transportation  ADL's:  Intact  Cognition: WNL  Sleep:  Fair   Screenings: AIMS    [place] Office Visit from 04/28/2022 in [facility] Total Score 0      GAD-7    [place] Office Visit from 04/28/2022 in [facility] Office Visit from 03/17/2022 in [facility]  Total GAD-7 Score 3 10      PHQ2-9    [place] Visit from 04/28/2022 in [facility] Office Visit from 03/17/2022 in [facility] Office Visit from 11/28/2017 in [facility]  PHQ-2 Total Score '2 3 2  '$ PHQ-9 Total Score '7 12 5      '$ [place] Visit from 04/28/2022 in [place] [facility] Office Visit from 03/17/2022 in [facility] ED to Hosp-Admission (Discharged) from 12/19/2021 in [facility] ICU/CCU  C-SSRS RISK CATEGORY No Risk No Risk No Risk        Assessment and Plan: Sergio [name] is a 46 year old Caucasian male with history of depression, anxiety, diabetes mellitus, neuropathy, hypertriglyceridemia, history of pancreatitis, obstructive sleep apnea on CPAP, history of bariatric surgery was evaluated in office today.  Patient is currently improving on the Wellbutrin, denies any  significant side effects at this time.  Will benefit from the following plan.  Plan MDD in partial remission Wellbutrin 75 mg p.o. daily in the morning Trazodone 50-100 mg p.o. nightly as needed  Anxiety disorder unspecified-improving Patient was provided CBT resources.  Memory loss-improving [place] -04/14/2022-26 out of 30. Currently reports memory as improving.  Follow-up in clinic in 5 to 6 weeks or sooner if needed.  This note was generated in part or whole with voice recognition software. Voice recognition is usually quite accurate but there are transcription errors that can and very often do occur. I apologize for any typographical errors that were not detected and corrected.    Ursula Alert, MD 04/28/2022, 1:00 PM

## 2022-04-29 DIAGNOSIS — D225 Melanocytic nevi of trunk: Secondary | ICD-10-CM | POA: Diagnosis not present

## 2022-04-29 DIAGNOSIS — L981 Factitial dermatitis: Secondary | ICD-10-CM | POA: Diagnosis not present

## 2022-04-29 DIAGNOSIS — Z86006 Personal history of melanoma in-situ: Secondary | ICD-10-CM | POA: Diagnosis not present

## 2022-04-29 DIAGNOSIS — D2271 Melanocytic nevi of right lower limb, including hip: Secondary | ICD-10-CM | POA: Diagnosis not present

## 2022-04-29 DIAGNOSIS — Z85828 Personal history of other malignant neoplasm of skin: Secondary | ICD-10-CM | POA: Diagnosis not present

## 2022-04-29 DIAGNOSIS — D2261 Melanocytic nevi of right upper limb, including shoulder: Secondary | ICD-10-CM | POA: Diagnosis not present

## 2022-04-29 DIAGNOSIS — Z08 Encounter for follow-up examination after completed treatment for malignant neoplasm: Secondary | ICD-10-CM | POA: Diagnosis not present

## 2022-05-06 DIAGNOSIS — E781 Pure hyperglyceridemia: Secondary | ICD-10-CM | POA: Diagnosis not present

## 2022-05-31 DIAGNOSIS — Z91148 Patient's other noncompliance with medication regimen for other reason: Secondary | ICD-10-CM | POA: Diagnosis not present

## 2022-05-31 DIAGNOSIS — E1165 Type 2 diabetes mellitus with hyperglycemia: Secondary | ICD-10-CM | POA: Diagnosis not present

## 2022-05-31 DIAGNOSIS — E1142 Type 2 diabetes mellitus with diabetic polyneuropathy: Secondary | ICD-10-CM | POA: Diagnosis not present

## 2022-05-31 DIAGNOSIS — E1129 Type 2 diabetes mellitus with other diabetic kidney complication: Secondary | ICD-10-CM | POA: Diagnosis not present

## 2022-05-31 DIAGNOSIS — Z794 Long term (current) use of insulin: Secondary | ICD-10-CM | POA: Diagnosis not present

## 2022-05-31 DIAGNOSIS — E781 Pure hyperglyceridemia: Secondary | ICD-10-CM | POA: Diagnosis not present

## 2022-05-31 DIAGNOSIS — R809 Proteinuria, unspecified: Secondary | ICD-10-CM | POA: Diagnosis not present

## 2022-06-22 DIAGNOSIS — E781 Pure hyperglyceridemia: Secondary | ICD-10-CM | POA: Diagnosis not present

## 2022-06-22 DIAGNOSIS — E1165 Type 2 diabetes mellitus with hyperglycemia: Secondary | ICD-10-CM | POA: Diagnosis not present

## 2022-06-22 DIAGNOSIS — E1142 Type 2 diabetes mellitus with diabetic polyneuropathy: Secondary | ICD-10-CM | POA: Diagnosis not present

## 2022-06-22 DIAGNOSIS — E1129 Type 2 diabetes mellitus with other diabetic kidney complication: Secondary | ICD-10-CM | POA: Diagnosis not present

## 2022-06-22 DIAGNOSIS — R809 Proteinuria, unspecified: Secondary | ICD-10-CM | POA: Diagnosis not present

## 2022-06-22 DIAGNOSIS — Z794 Long term (current) use of insulin: Secondary | ICD-10-CM | POA: Diagnosis not present

## 2022-06-30 ENCOUNTER — Ambulatory Visit (INDEPENDENT_AMBULATORY_CARE_PROVIDER_SITE_OTHER): Payer: 59 | Admitting: Psychiatry

## 2022-06-30 ENCOUNTER — Encounter: Payer: Self-pay | Admitting: Psychiatry

## 2022-06-30 VITALS — BP 147/97 | HR 82 | Temp 97.8°F | Wt 246.6 lb

## 2022-06-30 DIAGNOSIS — F419 Anxiety disorder, unspecified: Secondary | ICD-10-CM

## 2022-06-30 DIAGNOSIS — R413 Other amnesia: Secondary | ICD-10-CM

## 2022-06-30 DIAGNOSIS — F3341 Major depressive disorder, recurrent, in partial remission: Secondary | ICD-10-CM | POA: Insufficient documentation

## 2022-06-30 DIAGNOSIS — F3342 Major depressive disorder, recurrent, in full remission: Secondary | ICD-10-CM | POA: Diagnosis not present

## 2022-06-30 NOTE — Progress Notes (Unsigned)
[facility] MD OP Progress Note  06/30/2022 9:30 AM Sergio [name]  MRN:  [id number]  Chief Complaint:  Chief Complaint  Patient presents with   Follow-up: 46 year old male with history of depression, anxiety presented for medication management of the developing side effects to Wellbutrin.   HPI: Sergio [name] is a 46 year old Caucasian male married, lives in [place], employed, has a history of depression, anxiety, type 2 diabetes mellitus, familial hypertriglyceridemia, history of pancreatitis, diabetic neuropathy was evaluated in office today.  Patient today reports he stopped taking the Wellbutrin 3 weeks ago.  Reports his memory problems persisted and he also felt at times it got worse.  He hence stopped taking it since he felt the Wellbutrin could have triggered it.  Since stopping the Wellbutrin he has not had any significant memory issues although he continues to struggle with short-term memory like if he goes to the store with the list of things to buy and if he does not have a hard copy of list with him he tends to forget a few things.  Patient otherwise reports he feels better without the Wellbutrin.  He had struggled with motivation prior to being started on Wellbutrin.  Patient however reports he feels better, more energetic and motivated.  Reports he feels ' normal."  Patient reports he has a history of going through episodes of being down, unmotivated, struggles with anhedonia for a period of time.  Patient also reports he has this anxiety and feels on edge when he goes through these episodes when he feels too overwhelmed to even get out of his bed.  Patient reports those days he does the bare minimum to function.  Patient denies any significant hypomanic or manic symptoms.  He does not know his family history since he was adopted.  Patient does report sleep problems on and off however the trazodone does help.  He uses it as needed.  Denies side effects.  Denies any suicidality,  homicidality or perceptual disturbances.  Patient appeared to be alert, oriented to person place time situation.  3 word memory immediate 3 out of 3, after 5 minutes 3 out of 3.  Was able to spell the word 'world ' forward and backward. Patient could not do serial sevens.  Reports he recently went through a flareup of pancreatitis, currently doing better.  Patient denies any other concerns today.  Visit Diagnosis:    ICD-10-CM   1. MDD (major depressive disorder), recurrent, in full remission ([place])  F33.42     2. Anxiety disorder, unspecified type  F41.9     3. Memory loss  R41.3       Past Psychiatric History: Reviewed past psychiatric history from progress note on 03/17/2022.  Past trials of medications like Zoloft, Xanax.  Past Medical History:  Past Medical History:  Diagnosis Date   Anxiety    Chicken pox    Depression    Diabetes mellitus    DM2   Familial hypertriglyceridemia    severe   Gastric ulcer 2009   HTN (hypertension)    Morbid obesity ([facility])    s/p bariatric sleeve surgery 01/2015   Recurrent acute pancreatitis    secondary to hypertriglyceridemia    Skin cancer    Sleep apnea 1999   uses CPAP    Past Surgical History:  Procedure Laterality Date   CHOLECYSTECTOMY     LAPAROSCOPIC GASTRIC SLEEVE RESECTION     LUMBAR [place] SURGERY  2008   TONSILLECTOMY  2003   VASECTOMY  Family Psychiatric History: Reviewed family psychiatric history from progress note on 03/17/2022.  Family History:  Family History  Adopted: Yes  Problem Relation Age of Onset   Hypertension Mother    Hyperlipidemia Mother    Hypertension Father    Hyperlipidemia Father     Social History: Reviewed social history from progress note on 03/17/2022. Social History   Socioeconomic History   Marital status: Married    Spouse name: Not on file   Number of children: 3   Years of education: Not on file   Highest education level: Bachelor's degree (e.g., BA, AB, BS)   Occupational History   Occupation: Corporate treasurer  Tobacco Use   Smoking status: Never   Smokeless tobacco: Never  Vaping Use   Vaping Use: Never used  Substance and Sexual Activity   Alcohol use: No   Drug use: No   Sexual activity: Yes    Partners: Male  Other Topics Concern   Not on file  Social History Narrative   Daily Caffeine Use:  2-3 week/Soda   Regular Exercise -  NO   Social Determinants of Health   Financial Resource Strain: Unknown (01/16/2018)   Overall Financial Resource Strain (CARDIA)    Difficulty of Paying Living Expenses: Patient refused  Food Insecurity: Unknown (01/16/2018)   Hunger Vital Sign    Worried About Running Out of Food in the Last Year: Patient refused    [place] in the Last Year: Patient refused  Transportation Needs: Unknown (01/16/2018)   [place] - Transportation    Lack of Transportation (Medical): Patient refused    Lack of Transportation (Non-Medical): Patient refused  Physical Activity: Unknown (01/16/2018)   Exercise Vital Sign    Days of Exercise per Week: Patient refused    Minutes of Exercise per Session: Patient refused  Stress: Unknown (01/16/2018)   [employer] [place] of Stress : Patient refused  Social Connections: Unknown (01/16/2018)   Social Connection and Isolation Panel [NHANES]    Frequency of Communication with Friends and Family: Patient refused    Frequency of Social Gatherings with Friends and Family: Patient refused    Attends Religious Services: Patient refused    Active Member of Clubs or Organizations: Patient refused    Attends Archivist Meetings: Patient refused    Marital Status: Patient refused    Allergies:  Allergies  Allergen Reactions   Codeine Anaphylaxis   Iodinated Contrast Media Other (See Comments) and Anaphylaxis    Kidneys stop working KIDNEYS SHUT DOWN     Metabolic Disorder Labs: Lab Results  Component Value Date   HGBA1C 6.9 (H) 06/15/2019   MPG 151.33 06/15/2019   MPG 249 01/28/2017   No results found for: "PROLACTIN" Lab Results  Component Value Date   CHOL 698 (H) 12/19/2021   TRIG 770 (H) 12/29/2021   HDL NOT REPORTED DUE TO HIGH TRIGLYCERIDES 12/19/2021   CHOLHDL NOT REPORTED DUE TO HIGH TRIGLYCERIDES 12/19/2021   VLDL UNABLE TO CALCULATE IF TRIGLYCERIDE OVER 400 mg/dL 12/19/2021   LDLCALC UNABLE TO CALCULATE IF TRIGLYCERIDE OVER 400 mg/dL 12/19/2021   LDLCALC UNABLE TO CALCULATE IF TRIGLYCERIDE OVER 400 mg/dL 01/16/2018   Lab Results  Component Value Date   TSH 1.258 12/19/2021   TSH 1.906 06/15/2019    Therapeutic Level Labs: No results found for: "LITHIUM" No results found for: "VALPROATE" No results found for: "CBMZ"  Current Medications: Current Outpatient Medications  Medication Sig Dispense Refill   acetaminophen (TYLENOL) 325 MG tablet Take 2 tablets (650 mg total) by mouth every 6 (six) hours as needed for mild pain (or Fever >/= 101).     Continuous Blood Gluc Sensor (DEXCOM G6 SENSOR) MISC SMARTSIG:Topical Every 10 Days     Continuous Blood Gluc Transmit (DEXCOM G6 TRANSMITTER) MISC Use to monitor blood sugar.  Replace every 3 months     gemfibrozil (LOPID) 600 MG tablet Take 1 tablet (600 mg total) by mouth 2 (two) times daily before a meal. 60 tablet 2   HUMALOG KWIKPEN 200 UNIT/ML KwikPen SMARTSIG:28 Unit(s) SUB-Q Twice Daily     insulin lispro (HUMALOG) 100 UNIT/ML KwikPen Inject 200 Units into the skin. Takes differently     lisinopril (ZESTRIL) 10 MG tablet Take 1 tablet (10 mg total) by mouth daily. 30 tablet 2   omega-3 acid ethyl esters (LOVAZA) 1 g capsule Take 3 capsules (3 g total) by mouth at bedtime. 90 capsule 2   pregabalin (LYRICA) 300 MG capsule Take 300 mg by mouth at bedtime.      rosuvastatin (CRESTOR) 20 MG tablet Take 1 tablet (20 mg total) by mouth daily at 6 PM. 90 tablet 1    traZODone (DESYREL) 100 MG tablet Take 0.5-1 tablets (50-100 mg total) by mouth at bedtime as needed for sleep. 90 tablet 1   TRULICITY 1.5 HY/0.7PX SOPN Inject 3 mg into the skin every Monday.  4   ALPRAZolam (XANAX) 0.5 MG tablet TAKE 1 TABLET BY MOUTH TWICE DAILY AS NEEDED FOR ANXIETY (Patient not taking: Reported on 04/28/2022) 30 tablet 3   No current facility-administered medications for this visit.     Musculoskeletal: Strength & Muscle Tone: within normal limits Gait & Station: normal Patient leans: N/A  Psychiatric Specialty Exam: Review of Systems  Psychiatric/Behavioral: Negative.    All other systems reviewed and are negative.   Blood pressure (!) 147/97, pulse 82, temperature 97.8 F (36.6 C), temperature source Temporal, weight 246 lb 9.6 oz (111.9 kg).Body mass index is 35.38 kg/m.  General Appearance: Casual  Eye Contact:  Fair  Speech:  Normal Rate  Volume:  Normal  Mood:  Euthymic  Affect:  Congruent  Thought Process:  Goal Directed and Descriptions of Associations: Intact  Orientation:  Full (Time, Place, and Person)  Thought Content: Logical   Suicidal Thoughts:  No  Homicidal Thoughts:  No  Memory:  Immediate;   Fair Recent;   Fair Remote;   Fair does report short-term memory problems although improving.  Judgement:  Fair  Insight:  Fair  Psychomotor Activity:  Normal  Concentration:  Concentration: Fair and Attention Span: Fair  Recall:  [employer] of Knowledge: Fair  Language: Fair  Akathisia:  No  Handed:  Right  AIMS (if indicated): not done  Assets:  Communication Skills Desire for Improvement Housing Social Support Talents/Skills  ADL's:  Intact  Cognition: WNL  Sleep:  Fair   Screenings: [place] Office Visit from 04/28/2022 in [facility] Total Score 0      [facility] Office Visit from 06/30/2022 in [facility] Office Visit from 04/28/2022  in [facility] Visit from 03/17/2022 in [facility]  Total GAD-7 Score '3 3 10      '$ PHQ2-9    [facility] [place] Visit from 06/30/2022 in [facility] Office Visit from 04/28/2022 in [facility] Office Visit from 03/17/2022 in [facility] Office Visit from 11/28/2017 in [facility]  PHQ-2 Total Score '2 2 3 2  '$ PHQ-9 Total Score '7 7 12 5      '$ [place] Office Visit from 06/30/2022 in [place] [facility] Office Visit from 04/28/2022 in [facility] Office Visit from 03/17/2022 in [facility] No Risk No Risk No Risk        Assessment and Plan: Sergio [name] is a 46 year old Caucasian male with history of depression, anxiety, diabetes mellitus, neuropathy, hypertriglyceridemia, history of pancreatitis, obstructive sleep apnea on CPAP, history of bariatric surgery was evaluated in office today.  Patient with adverse side effects to Wellbutrin currently noncompliant, will benefit from the following plan.  Plan MDD in remission Discontinue Wellbutrin for side effects Continue trazodone 50-100 mg p.o. nightly as needed Patient as noted above does report mood swings, will consider adding a mood stabilizer as needed in the future. Patient to keep a log of his symptoms and bring it to next session.  Anxiety disorder unspecified-improving Patient was provided CBT resources.  Memory loss-improving [place]-04/14/2022-26 out of 30. Will repeat as needed.  Follow-up in clinic in 2 months or sooner if needed.  This note was generated in part or whole with voice recognition software. Voice recognition is usually quite accurate but there are transcription errors that can and very often do occur. I apologize for any typographical errors that  were not detected and corrected.     Ursula Alert, MD 07/01/2022, 8:22 AM

## 2022-07-08 DIAGNOSIS — E1165 Type 2 diabetes mellitus with hyperglycemia: Secondary | ICD-10-CM | POA: Diagnosis not present

## 2022-07-22 DIAGNOSIS — E1165 Type 2 diabetes mellitus with hyperglycemia: Secondary | ICD-10-CM | POA: Diagnosis not present

## 2022-08-09 DIAGNOSIS — E781 Pure hyperglyceridemia: Secondary | ICD-10-CM | POA: Diagnosis not present

## 2022-08-23 DIAGNOSIS — E1165 Type 2 diabetes mellitus with hyperglycemia: Secondary | ICD-10-CM | POA: Diagnosis not present

## 2022-08-31 ENCOUNTER — Encounter: Payer: Self-pay | Admitting: Psychiatry

## 2022-08-31 ENCOUNTER — Ambulatory Visit (INDEPENDENT_AMBULATORY_CARE_PROVIDER_SITE_OTHER): Payer: 59 | Admitting: Psychiatry

## 2022-08-31 VITALS — BP 148/87 | HR 87 | Temp 97.9°F | Ht 70.0 in | Wt 259.0 lb

## 2022-08-31 DIAGNOSIS — F330 Major depressive disorder, recurrent, mild: Secondary | ICD-10-CM | POA: Diagnosis not present

## 2022-08-31 DIAGNOSIS — F418 Other specified anxiety disorders: Secondary | ICD-10-CM | POA: Diagnosis not present

## 2022-08-31 MED ORDER — PROPRANOLOL HCL 10 MG PO TABS: 10.0000 mg | ORAL_TABLET | Freq: Two times a day (BID) | ORAL | 1 refills | Status: DC | PRN

## 2022-08-31 MED ORDER — BUSPIRONE HCL 10 MG PO TABS: 10.0000 mg | ORAL_TABLET | Freq: Two times a day (BID) | ORAL | 1 refills | Status: DC

## 2022-08-31 NOTE — Progress Notes (Unsigned)
[facility] MD OP Progress Note  08/31/2022 1:35 PM Sergio [name]  MRN:  [id number]  Chief Complaint:  Chief Complaint  Patient presents with   Follow-up   Anxiety   Depression   Medication Refill   HPI: Sergio [name] is a 46 year old Caucasian male, married, lives in [place], employed, has a history of depression, anxiety, type 2 diabetes mellitus, family and hypertriglyceridemia, history of pancreatitis, diabetic neuropathy was evaluated in office today.  Patient today going through situational stressors, reports his 18 year old son and 45 year old son both were expelled from the private school that they were attending, likely due to behavioral problems.  Patient reports this came as a surprise to him since they were only contacted by email.  Patient reports his parents are currently being home schooled.  He continues to have problems and it has been triggering his anxiety and depression.  Reports [place] since he tapered himself off of the Wellbutrin he has not had any memory problems.  However currently he feels he needs something to manage his work.  He can go through days when he feels extremely anxious, sad, irritable and overwhelmed.  Other days when he feels okay.  Patient reports sleep is overall okay on the trazodone.  Patient denies any suicidality, homicidality or perceptual disturbances.  Denies any other concerns today.  Visit Diagnosis:    ICD-10-CM   1. MDD (major depressive disorder), recurrent episode, mild (HCC)  F33.0 busPIRone (BUSPAR) 10 MG tablet    propranolol (INDERAL) 10 MG tablet    2. Other specified anxiety disorders  F41.8 busPIRone (BUSPAR) 10 MG tablet    propranolol (INDERAL) 10 MG tablet   generalized anxiety not occurring more days than not.      Past Psychiatric History: Reviewed past psychiatric history from progress note on 03/17/2022.  Past trials of medications like Zoloft, Xanax.  Past Medical History:  Past Medical History:  Diagnosis Date    Anxiety    Chicken pox    Depression    Diabetes mellitus    DM2   Familial hypertriglyceridemia    severe   Gastric ulcer 2009   HTN (hypertension)    Morbid obesity ([place])    s/p bariatric sleeve surgery 01/2015   Recurrent acute pancreatitis    secondary to hypertriglyceridemia    Skin cancer    Sleep apnea 1999   uses CPAP    Past Surgical History:  Procedure Laterality Date   CHOLECYSTECTOMY     LAPAROSCOPIC GASTRIC SLEEVE RESECTION     LUMBAR [place] SURGERY  2008   TONSILLECTOMY  2003   VASECTOMY      Family Psychiatric History: Reviewed family psychiatric history from progress note on 03/17/2022.  Family History:  Family History  Adopted: Yes  Problem Relation Age of Onset   Hypertension Mother    Hyperlipidemia Mother    Hypertension Father    Hyperlipidemia Father     Social History: Reviewed social history from progress note on 03/17/2022. Social History   Socioeconomic History   Marital status: Married    Spouse name: Not on file   Number of children: 3   Years of education: Not on file   Highest education level: Bachelor's degree (e.g., BA, AB, BS)  Occupational History   Occupation: Corporate treasurer  Tobacco Use   Smoking status: Never   Smokeless tobacco: Never  Vaping Use   Vaping Use: Never used  Substance and Sexual Activity   Alcohol  use: No   Drug use: No   Sexual activity: Yes    Partners: Male  Other Topics Concern   Not on file  Social History Narrative   Daily Caffeine Use:  2-3 week/Soda   Regular Exercise -  NO   Social Determinants of Health   Financial Resource Strain: Unknown (01/16/2018)   Overall Financial Resource Strain (CARDIA)    Difficulty of Paying Living Expenses: Patient refused  Food Insecurity: Unknown (01/16/2018)   Hunger Vital Sign    Worried About Running Out of Food in the Last Year: Patient refused    [place] in the Last Year: Patient refused   Transportation Needs: Unknown (01/16/2018)   [place] - Transportation    Lack of Transportation (Medical): Patient refused    Lack of Transportation (Non-Medical): Patient refused  Physical Activity: Unknown (01/16/2018)   Exercise Vital Sign    Days of Exercise per Week: Patient refused    Minutes of Exercise per Session: Patient refused  Stress: Unknown (01/16/2018)   [place] of Stress : Patient refused  Social Connections: Unknown (01/16/2018)   Social Connection and Isolation Panel [NHANES]    Frequency of Communication with Friends and Family: Patient refused    Frequency of Social Gatherings with Friends and Family: Patient refused    Attends Religious Services: Patient refused    Active Member of Clubs or Organizations: Patient refused    Attends Archivist Meetings: Patient refused    Marital Status: Patient refused    Allergies:  Allergies  Allergen Reactions   Codeine Anaphylaxis   Iodinated Contrast Media Other (See Comments) and Anaphylaxis    Kidneys stop working KIDNEYS SHUT DOWN    Metabolic Disorder Labs: Lab Results  Component Value Date   HGBA1C 6.9 (H) 06/15/2019   MPG 151.33 06/15/2019   MPG 249 01/28/2017   No results found for: "PROLACTIN" Lab Results  Component Value Date   CHOL 698 (H) 12/19/2021   TRIG 770 (H) 12/29/2021   HDL NOT REPORTED DUE TO HIGH TRIGLYCERIDES 12/19/2021   CHOLHDL NOT REPORTED DUE TO HIGH TRIGLYCERIDES 12/19/2021   VLDL UNABLE TO CALCULATE IF TRIGLYCERIDE OVER 400 mg/dL 12/19/2021   LDLCALC UNABLE TO CALCULATE IF TRIGLYCERIDE OVER 400 mg/dL 12/19/2021   LDLCALC UNABLE TO CALCULATE IF TRIGLYCERIDE OVER 400 mg/dL 01/16/2018   Lab Results  Component Value Date   TSH 1.258 12/19/2021   TSH 1.906 06/15/2019    Therapeutic Level Labs: No results found for: "LITHIUM" No results found for: "VALPROATE" No results found for:  "CBMZ"  Current Medications: Current Outpatient Medications  Medication Sig Dispense Refill   acetaminophen (TYLENOL) 325 MG tablet Take 2 tablets (650 mg total) by mouth every 6 (six) hours as needed for mild pain (or Fever >/= 101).     busPIRone (BUSPAR) 10 MG tablet Take 1 tablet (10 mg total) by mouth 2 (two) times daily. 60 tablet 1   Continuous Blood Gluc Sensor (DEXCOM G6 SENSOR) MISC SMARTSIG:Topical Every 10 Days     Continuous Blood Gluc Transmit (DEXCOM G6 TRANSMITTER) MISC Use to monitor blood sugar.  Replace every 3 months     gemfibrozil (LOPID) 600 MG tablet Take 1 tablet (600 mg total) by mouth 2 (two) times daily before a meal. 60 tablet 2   HUMALOG KWIKPEN 200 UNIT/ML KwikPen SMARTSIG:28 Unit(s) SUB-Q Twice Daily     insulin lispro (HUMALOG) 100 UNIT/ML KwikPen Inject  200 Units into the skin. Takes differently     lisinopril (ZESTRIL) 10 MG tablet Take 1 tablet (10 mg total) by mouth daily. 30 tablet 2   omega-3 acid ethyl esters (LOVAZA) 1 g capsule Take 3 capsules (3 g total) by mouth at bedtime. 90 capsule 2   pregabalin (LYRICA) 300 MG capsule Take 300 mg by mouth at bedtime.      propranolol (INDERAL) 10 MG tablet Take 1 tablet (10 mg total) by mouth 2 (two) times daily as needed. For severe anxiety attacks only 60 tablet 1   rosuvastatin (CRESTOR) 20 MG tablet Take 1 tablet (20 mg total) by mouth daily at 6 PM. 90 tablet 1   traZODone (DESYREL) 100 MG tablet Take 0.5-1 tablets (50-100 mg total) by mouth at bedtime as needed for sleep. 90 tablet 1   TRULICITY 3 ZJ/6.9CV SOPN Inject into the skin.     No current facility-administered medications for this visit.     Musculoskeletal: Strength & Muscle Tone: within normal limits Gait & Station: normal Patient leans: N/A  Psychiatric Specialty Exam: Review of Systems  Psychiatric/Behavioral:  Positive for decreased concentration and dysphoric mood. The patient is nervous/anxious.   All other systems reviewed and are  negative.   Blood pressure (!) 148/87, pulse 87, temperature 97.9 F (36.6 C), temperature source Oral, height '5\' 10"'$  (1.778 m), weight 259 lb (117.5 kg).Body mass index is 37.16 kg/m.  General Appearance: Casual  Eye Contact:  Fair  Speech:  Clear and Coherent  Volume:  Normal  Mood:  Anxious and Depressed  Affect:  Congruent  Thought Process:  Goal Directed and Descriptions of Associations: Intact  Orientation:  Full (Time, Place, and Person)  Thought Content: Logical   Suicidal Thoughts:  No  Homicidal Thoughts:  No  Memory:  Immediate;   Fair Recent;   Fair Remote;   Fair  Judgement:  Fair  Insight:  Fair  Psychomotor Activity:  Normal  Concentration:  Concentration: Fair and Attention Span: Fair  Recall:  [employer] of Knowledge: Fair  Language: Fair  Akathisia:  No  Handed:  Right  AIMS (if indicated): not done  Assets:  Communication Skills Desire for Improvement Housing Social Support  ADL's:  Intact  Cognition: WNL  Sleep:  Fair   Screenings: [facility] Office Visit from 04/28/2022 in [facility] Total Score 0      [place] Office Visit from 06/30/2022 in [facility] Office Visit from 04/28/2022 in [facility] Visit from 03/17/2022 in [place]  Total GAD-7 Score '3 3 10      '$ PHQ2-9    [place] Visit from 08/31/2022 in [place] Visit from 06/30/2022 in [facility] Visit from 04/28/2022 in [facility] Visit from 03/17/2022 in [facility] Office Visit from 11/28/2017 in [facility]  PHQ-2 Total Score '2 2 2 3 2  '$ PHQ-9 Total Score '6 7 7 12 5      '$ [place] Visit from 08/31/2022 in [facility] Office  Visit from 06/30/2022 in [facility] Office Visit from 04/28/2022 in [facility] No Risk No Risk No Risk        Assessment and Plan: Sergio [name] is a 46 year old Caucasian male with history  of depression, anxiety, diabetes mellitus, neuropathy, hypertriglyceridemia, history of pancreatitis, obstructive sleep apnea on CPAP, history of bariatric surgery was evaluated in the office today.  Patient with worsening anxiety, depression, current psychosocial stressors as noted above, will benefit from the following plan.  Plan MDD-unstable Start BuSpar 10 mg p.o. twice daily Referral for CBT  Other specified anxiety disorder, generalized anxiety not occurring more days than not-unstable Start BuSpar 10 mg p.o. twice daily Start propranolol 10 mg p.o. twice daily as needed for severe anxiety attacks Referred for CBT.  Follow-up in clinic in 4 to 5 weeks or sooner if needed.   This note was generated in part or whole with voice recognition software. Voice recognition is usually quite accurate but there are transcription errors that can and very often do occur. I apologize for any typographical errors that were not detected and corrected.    Ursula Alert, MD 08/31/2022, 1:35 PM

## 2022-08-31 NOTE — Patient Instructions (Signed)
Propranolol Tablets What is this medication? PROPRANOLOL (proe PRAN oh lole) treats many conditions such as high blood pressure, tremors, and a type of arrhythmia known as AFib (atrial fibrillation). It works by lowering your blood pressure and heart rate, making it easier for your heart to pump blood to the rest of your body. It may be used to prevent migraine headaches. It works by relaxing the blood vessels in the brain that cause migraines. It belongs to a group of medications called beta blockers. This medicine may be used for other purposes; ask your health care provider or pharmacist if you have questions. COMMON BRAND NAME(S): Inderal What should I tell my care team before I take this medication? They need to know if you have any of these conditions: Circulation problems or blood vessel disease Diabetes History of heart attack or heart disease, vasospastic angina Kidney disease Liver disease Lung or breathing disease, like asthma or emphysema Pheochromocytoma Slow heart rate Thyroid disease An unusual or allergic reaction to propranolol, other beta-blockers, medications, foods, dyes, or preservatives Pregnant or trying to get pregnant Breast-feeding How should I use this medication? Take this medication by mouth. Take it as directed on the prescription label at the same time every day. Keep taking it unless your care team tells you to stop. Talk to your care team about the use of this medication in children. Special care may be needed. Overdosage: If you think you have taken too much of this medicine contact a poison control center or emergency room at once. NOTE: This medicine is only for you. Do not share this medicine with others. What if I miss a dose? If you miss a dose, take it as soon as you can. If it is almost time for your next dose, take only that dose. Do not take double or extra doses. What may interact with this medication? Do not take this medication with any of the  following: Feverfew Phenothiazines like chlorpromazine, mesoridazine, prochlorperazine, thioridazine This medication may also interact with the following: Aluminum hydroxide gel Antipyrine Antiviral medications for HIV or AIDS Barbiturates like phenobarbital Certain medications for blood pressure, heart disease, irregular heart beat Cimetidine Ciprofloxacin Diazepam Fluconazole Haloperidol Isoniazid Medications for cholesterol like cholestyramine or colestipol Medications for mental depression Medications for migraine headache like almotriptan, eletriptan, frovatriptan, naratriptan, rizatriptan, sumatriptan, zolmitriptan NSAIDs, medications for pain and inflammation, like ibuprofen or naproxen Phenytoin Rifampin Teniposide Theophylline Thyroid medications Tolbutamide Warfarin Zileuton This list may not describe all possible interactions. Give your health care provider a list of all the medicines, herbs, non-prescription drugs, or dietary supplements you use. Also tell them if you smoke, drink alcohol, or use illegal drugs. Some items may interact with your medicine. What should I watch for while using this medication? Visit your care team for regular checks on your progress. Check your blood pressure as directed. Ask your care team what your blood pressure should be. Also, find out when you should contact him or her. Do not treat yourself for coughs, colds, or pain while you are using this medication without asking your care team for advice. Some medications may increase your blood pressure. You may get drowsy or dizzy. Do not drive, use machinery, or do anything that needs mental alertness until you know how this medication affects you. Do not stand up or sit up quickly, especially if you are an older patient. This reduces the risk of dizzy or fainting spells. Alcohol may interfere with the effect of this medication. Avoid alcoholic drinks.  This medication may increase blood sugar.  Ask your care team if changes in diet or medications are needed if you have diabetes. What side effects may I notice from receiving this medication? Side effects that you should report to your care team as soon as possible: Allergic reactions--skin rash, itching, hives, swelling of the face, lips, tongue, or throat Heart failure--shortness of breath, swelling of the ankles, feet, or hands, sudden weight gain, unusual weakness or fatigue Low blood pressure--dizziness, feeling faint or lightheaded, blurry vision Raynaud's--cool, numb, or painful fingers or toes that may change color from pale, to blue, to red Redness, blistering, peeling, or loosening of the skin, including inside the mouth Slow heartbeat--dizziness, feeling faint or lightheaded, confusion, trouble breathing, unusual weakness or fatigue Worsening mood, feelings of depression Side effects that usually do not require medical attention (report to your care team if they continue or are bothersome): Change in sex drive or performance Diarrhea Dizziness Fatigue Headache This list may not describe all possible side effects. Call your doctor for medical advice about side effects. You may report side effects to FDA at 1-800-FDA-1088. Where should I keep my medication? Keep out of the reach of children and pets. Store at room temperature between 20 and 25 degrees C (68 and 77 degrees F). Protect from light. Throw away any unused medication after the expiration date. NOTE: This sheet is a summary. It may not cover all possible information. If you have questions about this medicine, talk to your doctor, pharmacist, or health care provider.  2023 Elsevier/Gold Standard (2021-01-04 00:00:00) Buspirone Tablets What is this medication? BUSPIRONE (byoo SPYE rone) treats anxiety. It works by balancing the levels of dopamine and serotonin in your brain, substances that help regulate mood. This medicine may be used for other purposes; ask your  health care provider or pharmacist if you have questions. COMMON BRAND NAME(S): BuSpar, Buspar Dividose What should I tell my care team before I take this medication? They need to know if you have any of these conditions: Kidney or liver disease An unusual or allergic reaction to buspirone, other medications, foods, dyes, or preservatives Pregnant or trying to get pregnant Breast-feeding How should I use this medication? Take this medication by mouth with a glass of water. Follow the directions on the prescription label. You may take this medication with or without food. To ensure that this medication always works the same way for you, you should take it either always with or always without food. Take your doses at regular intervals. Do not take your medication more often than directed. Do not stop taking except on the advice of your care team. Talk to your care team about the use of this medication in children. Special care may be needed. Overdosage: If you think you have taken too much of this medicine contact a poison control center or emergency room at once. NOTE: This medicine is only for you. Do not share this medicine with others. What if I miss a dose? If you miss a dose, take it as soon as you can. If it is almost time for your next dose, take only that dose. Do not take double or extra doses. What may interact with this medication? Do not take this medication with any of the following: Linezolid MAOIs like Carbex, Eldepryl, Marplan, Nardil, and Parnate Methylene blue Procarbazine This medication may also interact with the following: Diazepam Digoxin Diltiazem Erythromycin Grapefruit juice Haloperidol Medications for mental depression or mood problems Medications for seizures like carbamazepine,  phenobarbital and phenytoin Nefazodone Other medications for anxiety Rifampin Ritonavir Some antifungal medications like itraconazole, ketoconazole, and  voriconazole Verapamil Warfarin This list may not describe all possible interactions. Give your health care provider a list of all the medicines, herbs, non-prescription drugs, or dietary supplements you use. Also tell them if you smoke, drink alcohol, or use illegal drugs. Some items may interact with your medicine. What should I watch for while using this medication? Visit your care team for regular checks on your progress. It may take 1 to 2 weeks before your anxiety gets better. This medication may affect your coordination, reaction time, or judgment. Do not drive or operate machinery until you know how this medication affects you. Sit up or stand slowly to reduce the risk of dizzy or fainting spells. Drinking alcohol with this medication can increase the risk of these side effects. What side effects may I notice from receiving this medication? Side effects that you should report to your care team as soon as possible: Allergic reactions--skin rash, itching, hives, swelling of the face, lips, tongue, or throat Irritability, confusion, fast or irregular heartbeat, muscle stiffness, twitching muscles, sweating, high fever, seizure, chills, vomiting, diarrhea, which may be signs of serotonin syndrome Side effects that usually do not require medical attention (report to your care team if they continue or are bothersome): Anxiety, nervousness Dizziness Drowsiness Headache Nausea Trouble sleeping This list may not describe all possible side effects. Call your doctor for medical advice about side effects. You may report side effects to FDA at 1-800-FDA-1088. Where should I keep my medication? Keep out of the reach of children. Store at room temperature below 30 degrees C (86 degrees F). Protect from light. Keep container tightly closed. Throw away any unused medication after the expiration date. NOTE: This sheet is a summary. It may not cover all possible information. If you have questions about  this medicine, talk to your doctor, pharmacist, or health care provider.  2023 Elsevier/Gold Standard (2007-11-18 00:00:00)

## 2022-09-06 DIAGNOSIS — E781 Pure hyperglyceridemia: Secondary | ICD-10-CM | POA: Diagnosis not present

## 2022-09-07 ENCOUNTER — Ambulatory Visit (INDEPENDENT_AMBULATORY_CARE_PROVIDER_SITE_OTHER): Payer: 59 | Admitting: Licensed Clinical Social Worker

## 2022-09-07 ENCOUNTER — Encounter: Payer: Self-pay | Admitting: Licensed Clinical Social Worker

## 2022-09-07 DIAGNOSIS — F330 Major depressive disorder, recurrent, mild: Secondary | ICD-10-CM | POA: Diagnosis not present

## 2022-09-07 DIAGNOSIS — F419 Anxiety disorder, unspecified: Secondary | ICD-10-CM | POA: Diagnosis not present

## 2022-09-07 NOTE — Plan of Care (Signed)
  Problem: Depression Goal:  Depression  Description: Reduce overall frequency, intensity and duration of depression so that daily functioning is not impaired per pt self report 3 out of 5 sessions documented.   Outcome: Not Progressing Goal: STG: [place] WILL PARTICIPATE IN AT LEAST 80% OF SCHEDULED INDIVIDUAL PSYCHOTHERAPY SESSIONS Outcome: Not Progressing   Problem: Anxiety  Goal:  Anxiety  Description: Reduce overall frequency, intensity and duration of anxiety so that daily functioning is not impaired per pt self report 3 out of 5 sessions.   Outcome: Not Progressing Goal: STG: Patient will participate in at least 80% of scheduled individual psychotherapy sessions Outcome: Not Progressing   Problem: Trauma  Goal:  Trauma  Description: Pt will explore coping skills and learn coping and grounding skills to reduce symptoms of PTSD and prepare to handle future stressful situations as evidenced by implementing coping skills per pt report 3 out of 5 documented sessions.   Outcome: Not Progressing
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
## 2022-09-22 ENCOUNTER — Ambulatory Visit (INDEPENDENT_AMBULATORY_CARE_PROVIDER_SITE_OTHER): Payer: 59 | Admitting: Licensed Clinical Social Worker

## 2022-09-22 DIAGNOSIS — F330 Major depressive disorder, recurrent, mild: Secondary | ICD-10-CM

## 2022-09-22 DIAGNOSIS — F419 Anxiety disorder, unspecified: Secondary | ICD-10-CM

## 2022-09-22 NOTE — Progress Notes (Signed)
THERAPIST PROGRESS NOTE  Session Time: 9:00am-9:55am   Participation Level: Active  Behavioral Response: Well GroomedAlertEuthymic  Type of Therapy: Individual Therapy  Treatment Goals addressed: Anxiety Reduce overall frequency, intensity and duration of anxiety so that daily functioning is not impaired per pt self report 3 out of 5 sessions.   ProgressTowards Goals: Progressing  Interventions: CBT and Supportive  Summary: Sergio [name] is a 46 y.o. male who presents with continuing symptoms of anxiety and depression. Pt stated that he has been anxious about his sons and stated that his youngest son has been having anxiety and that his middle son is still living with his grandparents. Pt reports that he has tried being supportive with his children and that he only wants that best for them.  Pt stated that he has noticed that some days he has no motivation to get out of bed. Pt reports that when he has a schedule or something that has to do that he can get out of bed. Pt reported that when someone else like his oldest son is depending on him for work that he is able to get out of bed and get down what he needs to.   Pt was able to explore his motivation in session and stated that he will try and do a new hobby or something that he enjoys to help alleviate anxiety. Pt reports that in the past he was part of a hunting club and that he enjoyed it. Pt stated that he would look into a new hobby to do when he has time. Pt stated that he would change the environment and open his blinds in the morning to let sunlight in and change what he does on the days that he does not feel motivated and has no plans or work.   Pt was able to explore how a schedule can help with motivation and help him stay accountable for what he needs to accomplish.  Pt denies SI/HI or A/V hallucinations.Pt was cooperative during visit and was engaged throughout the visit. Pt does not report any other concerns at the time  of visit.  Pt was oriented to time, place and situation.   Suicidal/Homicidal: Nowithout intent/plan  Therapist Response: used CBT techniques to explore how pts thoughts impact his feelings and actions. Discussed with the pt his motivation and the importance of changing ones environment to help improve motivation and explored the benefits of a weekly schedule to help stay on track of what needs to be accomplished and stay motivated in completing all tasks. Explored with the pt coping skills to help alleviate anxiety and educated on the benefits of healthy coping skills.   Allowed pt to explore thoughts and feelings associated with life situations and external stressors. Encouraged expression of feelings and used empathic listening.   Continued Recommendations as followed: Self-care behaviors, positive social engagements, focusing on positive physical and emotional wellness, and focusing on life/work balance.     Plan: Return again on 10/08/2022.  Diagnosis:  Encounter Diagnoses  Name Primary?   Anxiety disorder, unspecified type Yes   MDD (major depressive disorder), recurrent episode, mild ([place])      Collaboration of Care: none   Patient/Guardian was advised Release of Information must be obtained prior to any record release in order to collaborate their care with an outside provider. Patient/Guardian was advised if they have not already done so to contact the registration department to sign all necessary forms in order for [place] to release information regarding  their care.   Consent: Patient/Guardian gives verbal consent for treatment and assignment of benefits for services provided during this visit. Patient/Guardian expressed understanding and agreed to proceed.   Lorenda Hatchet 09/22/2022

## 2022-09-27 DIAGNOSIS — E781 Pure hyperglyceridemia: Secondary | ICD-10-CM | POA: Diagnosis not present

## 2022-10-08 ENCOUNTER — Ambulatory Visit (INDEPENDENT_AMBULATORY_CARE_PROVIDER_SITE_OTHER): Payer: 59 | Admitting: Licensed Clinical Social Worker

## 2022-10-08 DIAGNOSIS — F419 Anxiety disorder, unspecified: Secondary | ICD-10-CM

## 2022-10-08 DIAGNOSIS — F330 Major depressive disorder, recurrent, mild: Secondary | ICD-10-CM | POA: Diagnosis not present

## 2022-10-08 NOTE — Progress Notes (Signed)
THERAPIST PROGRESS NOTE  Session Time: 9:00am-9:59am    Participation Level: Active  Behavioral Response: Well GroomedAlertEuthymic  Type of Therapy: Individual Therapy  Treatment Goals addressed: Anxiety: Reduce overall frequency, intensity and duration of anxiety so that daily functioning is not impaired per pt self report 3 out of 5 sessions.   ProgressTowards Goals: Progressing  Interventions: CBT and Supportive  Summary: Sergio [name] is a 46 y.o. male who presents with continuing symptoms of anxiety and depression. Pt reported that he has noticed that he is irritable a few days of the week and stated that due to his sister that he has been more irritable with what she has been saying and the choices that she makes. Pt was able to explore how he controls only his own behaviors and feelings and was able to explore ways that he can set boundaries and cope with his feelings of anxiety.     LCSW provided mood monitoring and treatment progress review in the context of this episode of treatment. LCSW reviewed the pt's mood status since last session.  Allowed pt to explore thoughts and feelings associated with life situations and external stressors. Encouraged expression of feelings and used empathic listening. Pt was oriented to time, place and situation. LCSW validated the pts feelings and thoughts and showed unconditional positive regard.     Pt stated that he has noticed that his motivation has not changed since the last visit and that he was not able to join the hunting club that he discussed at the last visit. Pt reported that he still had a desire to join the hunting club but that he has not yet.  Pt was able to explore new ways that he could cope with his anxiety and stated that he was going to try and do something for himself everyday for a few minutes. Pt was able to explore his motivation in session and stated that he will try and do a new hobby or something that he enjoys to  help alleviate anxiety. Pt was able to explore his mood  in session and was able to discuss ways to improve his overall mood.   Pt was able to explore cognitive distractions and stated that he would try to focus in on an object or his environment to distract himself. Pt stated that he has social support from his wife and oldest son and that helps with coping. Pt was able to explore boundaries and how they are beneficial for him.   Pt denies SI/HI or A/V hallucinations.Pt was cooperative during visit and was engaged throughout the visit. Pt does not report any other concerns at the time of visit.  Pt was oriented to time, place and situation.   Suicidal/Homicidal: Nowithout intent/plan  Therapist Response: Encouraged the pt to participate in activities in which they will experience success and achievement. Explored with the pt distraction as a coping mechanism and explored cognitive distractions, behavioral distraction and physiological distractions. Provided pt with examples of each form of distractions and explored with pt new ways that they could implement the distractions into their daily routine when they face difficult emotions and stressors. Explored with the pt ways to improve motivation.   Used CBT techniques to explore how pts thoughts impact his feelings and actions. Discussed with the pt his motivation and the importance of changing ones environment to help improve motivation and explored the benefits of a weekly schedule to help stay on track of what needs to be accomplished and stay  motivated in completing all tasks. Explored with the pt coping skills to help alleviate anxiety and educated on the benefits of healthy coping skills.    Continued Recommendations as followed: Self-care behaviors, positive social engagements, focusing on positive physical and emotional wellness, and focusing on life/work balance.     Plan: Return again on 10/26/2022 for therapy.   Diagnosis:  Encounter  Diagnoses  Name Primary?   Anxiety disorder, unspecified type Yes   MDD (major depressive disorder), recurrent episode, mild ([place])      Collaboration of Care: Pt encouraged to continue care with psychiatrist of record Dr. Shea Evans.     Patient/Guardian was advised Release of Information must be obtained prior to any record release in order to collaborate their care with an outside provider. Patient/Guardian was advised if they have not already done so to contact the registration department to sign all necessary forms in order for [place] to release information regarding their care.   Consent: Patient/Guardian gives verbal consent for treatment and assignment of benefits for services provided during this visit. Patient/Guardian expressed understanding and agreed to proceed.   Lorenda Hatchet 10/08/2022

## 2022-10-26 ENCOUNTER — Ambulatory Visit: Payer: 59 | Admitting: Licensed Clinical Social Worker

## 2022-10-26 DIAGNOSIS — F419 Anxiety disorder, unspecified: Secondary | ICD-10-CM | POA: Diagnosis not present

## 2022-10-26 DIAGNOSIS — F330 Major depressive disorder, recurrent, mild: Secondary | ICD-10-CM

## 2022-10-26 NOTE — Progress Notes (Addendum)
THERAPIST PROGRESS NOTE  Session Time: 9:00am-10:00am   Participation Level: Active  Behavioral Response: Well GroomedAlertAnxious  Type of Therapy: Individual Therapy  Treatment Goals addressed: Depression: Reduce overall frequency, intensity and duration of anxiety so that daily functioning is not impaired per pt self report 3 out of 5 sessions.    Intervention: Will work with pt to learn and implement coping skills that result in a reduction of depression and improve daily functioning per pt self report 3 out of 5 documented sessions.   ProgressTowards Goals: Progressing  Interventions: CBT and Supportive  Pt presented in person at [facility] office. Pt and LCSW were present during the visit.    Summary: Sergio [name] is a 47 y.o. male who presents with continuing symptoms of anxiety and depression. Pt stated that he has been having medical issues have been causing him distress. Pt stated that he has been noticing that some days he has little interest and pleasure in doing things. Pt stated that he has been trying to do cope with his feelings of depression.   Pt stated that he has enjoyed having an office where he is able to do his work outside of his home. Pt reported that he has a space that is his own office and that it has been a nice balance between his home life and his work life. Pt stated that the new office he has has also been a safe place where he can go when he needs to get away and reflect.    LCSW provided mood monitoring and treatment progress review in the context of this episode of treatment. LCSW reviewed the pt's mood status since last session.  Allowed pt to explore thoughts and feelings associated with life situations and external stressors. Encouraged expression of feelings and used empathic listening. Pt was oriented to time, place and situation. LCSW validated the pts feelings and thoughts and showed unconditional positive regard.    Pt stated that he would use the 5-4-3-2-1 technique discussed in session and see how impactful it is for him to use the technique to stay grounded when he is overwhelmed. Pt was able to explore how he has used guided imagery in the past and how it was helpful in calming him and impacting him mood.   Pt stated that he has not used the three good experience in the past to build positive experiences but was engaged in session when discussing how positive experiences can help with improving mood and symptoms of depression.   Pt denies SI/HI or A/V hallucinations. Pt was cooperative during visit and was engaged throughout the visit. Pt does not report any other concerns at the time of visit. Encouraged pt to take medications as prescribed by their psychiatrist.    Discussed with the pt the transition of a new therapist and provided the pt with the choice of continuing services with the new therapist and answered any questions that the pt had about the transition.    Suicidal/Homicidal: Nowithout intent/plan  Therapist Response: Educated on grounding technique 5-4-3-2-1 technique to help control difficult emotions and symptoms by turning attention away from those uncomfortable thoughts, memories or worries and focusing on the present moment. Using the 5-4-3-2-1 technique to take in the details of their surrounding by using the five senses. Explored each step with the pt and engaged the pt to try the technique at home when they are facing difficult emotions. What are five things that you can see by exploring  small details in their environment to include an object, the way that the light reflects off another surface or any other detail of what they see. Next exploring four things that you can feel. Noticing the sensations of how an object feels, how is the chair that they are sitting on feels, how does sun feel on your skin, tuning in to the different sensations of touch. Exploring three things that you can  hear to include the sound of the wind, the sound of a clock and other surrounding sounds that are in the environment you are in. Then noticing two things that the pt can smell, to include the air around then do they smell fresh cut grass, a candle or food. Lastly, exploring taste and if there is anything they can taste to include gum, food or another flavors. Engaged with the pt to strive to notice the small details of this technique that usually they would tune out such as distant noises and objects that they see on a daily basis. LCSW encouraged the pt to explore their thoughts and feelings about the technique and how it could be helpful to allow the pt to be present and mindful in the moment.    Educated the pt on positive coping skills to help with depression. Explored with the pt the negative thinking is a feature of depression and that the positive experiences are minimized as a result. Explored how gratitude can help focus on the positive experiences, and not magnify the negative experiences. Explored with the pt to write about three positive experiences from their day. Engaged the pt to look at all positive experiences and that they can be small. Provided the pt with examples of positive experiences. Explored with the pt why the good experience was meaningful to them, engaged with the pt to explore how they can experience more of this good experience they had. Encouraged the pt to repeat this exercise every day for two weeks to strive to acknowledge positive experiences.     Used CBT techniques to explore how pts thoughts impact his feelings and actions. Discussed with the pt his motivation and the importance of changing ones environment to help improve motivation and explored the benefits of a weekly schedule to help stay on track of what needs to be accomplished and stay motivated in completing all tasks. Explored with the pt coping skills to help alleviate anxiety and educated on the benefits of  healthy coping skills.    Continued Recommendations as followed: Self-care behaviors, positive social engagements, focusing on positive physical and emotional wellness, and focusing on life/work balance.     Plan: Return again on 11/11/2022.   Diagnosis:  Encounter Diagnoses  Name Primary?   Anxiety disorder, unspecified type Yes   MDD (major depressive disorder), recurrent episode, mild ([place])      Collaboration of Care: Pt encouraged to continue care with psychiatrist of record Dr. Shea Evans.     Patient/Guardian was advised Release of Information must be obtained prior to any record release in order to collaborate their care with an outside provider. Patient/Guardian was advised if they have not already done so to contact the registration department to sign all necessary forms in order for [place] to release information regarding their care.   Consent: Patient/Guardian gives verbal consent for treatment and assignment of benefits for services provided during this visit. Patient/Guardian expressed understanding and agreed to proceed.   Lorenda Hatchet 10/26/2022

## 2022-10-27 ENCOUNTER — Ambulatory Visit: Payer: No Typology Code available for payment source | Admitting: Psychiatry

## 2022-10-28 DIAGNOSIS — G4733 Obstructive sleep apnea (adult) (pediatric): Secondary | ICD-10-CM | POA: Diagnosis not present

## 2022-10-28 DIAGNOSIS — E1142 Type 2 diabetes mellitus with diabetic polyneuropathy: Secondary | ICD-10-CM | POA: Diagnosis not present

## 2022-10-28 DIAGNOSIS — R809 Proteinuria, unspecified: Secondary | ICD-10-CM | POA: Diagnosis not present

## 2022-10-28 DIAGNOSIS — R7309 Other abnormal glucose: Secondary | ICD-10-CM | POA: Diagnosis not present

## 2022-10-28 DIAGNOSIS — E781 Pure hyperglyceridemia: Secondary | ICD-10-CM | POA: Diagnosis not present

## 2022-10-28 DIAGNOSIS — Z1339 Encounter for screening examination for other mental health and behavioral disorders: Secondary | ICD-10-CM | POA: Diagnosis not present

## 2022-10-28 DIAGNOSIS — Z794 Long term (current) use of insulin: Secondary | ICD-10-CM | POA: Diagnosis not present

## 2022-10-28 DIAGNOSIS — E1129 Type 2 diabetes mellitus with other diabetic kidney complication: Secondary | ICD-10-CM | POA: Diagnosis not present

## 2022-11-05 DIAGNOSIS — E1165 Type 2 diabetes mellitus with hyperglycemia: Secondary | ICD-10-CM | POA: Diagnosis not present

## 2022-11-05 DIAGNOSIS — Z794 Long term (current) use of insulin: Secondary | ICD-10-CM | POA: Diagnosis not present

## 2022-11-05 DIAGNOSIS — E119 Type 2 diabetes mellitus without complications: Secondary | ICD-10-CM | POA: Diagnosis not present

## 2022-11-09 DIAGNOSIS — E781 Pure hyperglyceridemia: Secondary | ICD-10-CM | POA: Diagnosis not present

## 2022-11-11 ENCOUNTER — Ambulatory Visit (INDEPENDENT_AMBULATORY_CARE_PROVIDER_SITE_OTHER): Payer: No Typology Code available for payment source | Admitting: Licensed Clinical Social Worker

## 2022-11-11 DIAGNOSIS — F330 Major depressive disorder, recurrent, mild: Secondary | ICD-10-CM

## 2022-11-11 DIAGNOSIS — F419 Anxiety disorder, unspecified: Secondary | ICD-10-CM | POA: Diagnosis not present

## 2022-11-11 NOTE — Progress Notes (Signed)
THERAPIST PROGRESS NOTE  Session Time: 8:11am-9:00am   Participation Level: Active  Behavioral Response: Well GroomedAlertAnxious  Type of Therapy: Individual Therapy  Treatment Goals addressed: Depression: Reduce overall frequency, intensity and duration of anxiety so that daily functioning is not impaired per pt self report 3 out of 5 sessions.    Intervention: Will work with pt to learn and implement coping skills that result in a reduction of depression and improve daily functioning per pt self report 3 out of 5 documented sessions.   ProgressTowards Goals: Progressing  Interventions: CBT and Supportive  Pt presented in person at [facility] office. Pt and LCSW were present during the visit.  LCSW Alvy Beal was present during the visit.   Summary: Sergio [name] is a 47 y.o. male who presents with continuing symptoms of anxiety and depression. Pt stated that he has been having medical issues have been causing him distress. Pt was able to discuss his mood and was able to explore that he was spending time with his family and that his wife was a supportive person in his life. Pt stated that he enjoys spending time with his son and watching him play basketball. Pt stated that he will continue to try and use his coping skills and that his office is still his safe place.    LCSW provided mood monitoring and treatment progress review in the context of this episode of treatment. LCSW reviewed the pt's mood status since last session.  Allowed pt to explore thoughts and feelings associated with life situations and external stressors. Encouraged expression of feelings and used empathic listening. Pt was oriented to time, place and situation. LCSW validated the pts feelings and thoughts and showed unconditional positive regard.   PHQ-9 score on 10/26/2022 was a 6 and on 11/11/2022 PHQ-9 score was a 6. GAD-7 score was a score of a 3 on 10/26/2022 and on  11/11/2022 score was  a 5.   Pt stated that he is looking forward to a retreat with his wife in the future and that he has been to one in the past. 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 Pt stated that he was looking forward to going on a mens retreat with his son and getting away from his job and being at his cabin. Pt was able to explore his social support and stated that he has been part of a support group that has been helpful to go to and talk to others that are facing similar stressors.    Pt denies SI/HI or A/V hallucinations. Pt was cooperative during visit and was engaged throughout the visit. Pt does not report any other concerns at the time of visit. Encouraged pt to take medications as prescribed by their psychiatrist.    Continued Recommendations as followed: Self-care behaviors, positive social engagements, focusing on positive physical and emotional wellness, and focusing on life/work balance.    Pt is continuing to apply interventions/techniques learned in session into daily life situations. Pt is currently on track to meet goals utilizing interventions that are discussed in session. Treatment to continue as indicated. Personal growth and progress toward goals noted above.    Suicidal/Homicidal: Nowithout intent/plan  Therapist Response: Educated on the importance of healthy coping skills with the pt. Explored the benefits of healthy coping skills and engaged the pt to discuss ways that they are  coping with anxiety and depression. Encouraged the pt to explore new ways to help alleviate anxiety and determined new ways to help with distraction and coping in session. Discussed the benefits of exercise as a way of coping with anxiety and stress. Explored different ways that the pt could cope to include listening to music, being in nature, trying a new hobby and other activities that the pt could try  to help with coping.     Used CBT techniques to explore how pts thoughts impact his feelings and actions. Discussed with the pt his motivation and the importance of changing ones environment to help improve motivation and explored the benefits of a weekly schedule to help stay on track of what needs to be accomplished and stay motivated in completing all tasks. Explored with the pt coping skills to help alleviate anxiety and educated on the benefits of healthy coping skills.    Explored with pt how one important way to regain emotional health is to develop and utilize healthy social supports. Engaged the pt to explore who their support system is to include any family members, friends, other resources or social groups that they are part of feel and important to. Explored that the support must be someone or a group that they trust and affirm their individuality. Encouraged the pt to explore any barriers that stand in the way in developing their support system to include: difficult time reaching out to others, low self-esteem, hard time making and keeping friends and any other barriers that prevent them from having social supports. Explored what the pt feels that they need and want from their support system and empowered the pt to commit themselves to develop a support sytem and to not give up on developing and utilizing their healthy support system. Explored with the pt how it makes them feel to be supportive and looked at how they can look for new supports and resources to help them with providing positive support.         Plan: Discussed with the pt the transition of a new therapist and provided the pt with the choice of continuing services with the new therapist and answered any questions that the pt had about the transition. Pt agreed to the transition.    Diagnosis:  Encounter Diagnoses  Name Primary?   Anxiety disorder, unspecified type Yes   MDD (major depressive disorder), recurrent episode,  mild ([place])        Collaboration of Care: Pt encouraged to continue care with psychiatrist of record Dr. Shea Evans.     Patient/Guardian was advised Release of Information must be obtained prior to any record release in order to collaborate their care with an outside provider. Patient/Guardian was advised if they have not already done so to contact the registration department to sign all necessary forms in order for [place] to release information regarding their care.   Consent: Patient/Guardian gives verbal consent for treatment and assignment of benefits for services provided during this visit. Patient/Guardian expressed understanding and agreed to proceed.   Lorenda Hatchet 11/11/2022

## 2022-11-19 DIAGNOSIS — F909 Attention-deficit hyperactivity disorder, unspecified type: Secondary | ICD-10-CM | POA: Diagnosis not present

## 2022-11-19 DIAGNOSIS — F32A Depression, unspecified: Secondary | ICD-10-CM | POA: Diagnosis not present

## 2022-11-24 ENCOUNTER — Ambulatory Visit: Payer: No Typology Code available for payment source | Admitting: Psychiatry

## 2022-11-29 DIAGNOSIS — Z794 Long term (current) use of insulin: Secondary | ICD-10-CM | POA: Diagnosis not present

## 2022-11-29 DIAGNOSIS — Z9641 Presence of insulin pump (external) (internal): Secondary | ICD-10-CM | POA: Diagnosis not present

## 2022-11-29 DIAGNOSIS — E1165 Type 2 diabetes mellitus with hyperglycemia: Secondary | ICD-10-CM | POA: Diagnosis not present

## 2022-11-29 DIAGNOSIS — R809 Proteinuria, unspecified: Secondary | ICD-10-CM | POA: Diagnosis not present

## 2022-11-29 DIAGNOSIS — E1129 Type 2 diabetes mellitus with other diabetic kidney complication: Secondary | ICD-10-CM | POA: Diagnosis not present

## 2022-11-29 DIAGNOSIS — E781 Pure hyperglyceridemia: Secondary | ICD-10-CM | POA: Diagnosis not present

## 2022-11-29 DIAGNOSIS — R109 Unspecified abdominal pain: Secondary | ICD-10-CM | POA: Diagnosis not present

## 2022-11-29 DIAGNOSIS — E1142 Type 2 diabetes mellitus with diabetic polyneuropathy: Secondary | ICD-10-CM | POA: Diagnosis not present

## 2022-12-06 ENCOUNTER — Ambulatory Visit (INDEPENDENT_AMBULATORY_CARE_PROVIDER_SITE_OTHER): Payer: 59 | Admitting: Licensed Clinical Social Worker

## 2022-12-06 DIAGNOSIS — F330 Major depressive disorder, recurrent, mild: Secondary | ICD-10-CM

## 2022-12-06 DIAGNOSIS — F419 Anxiety disorder, unspecified: Secondary | ICD-10-CM

## 2022-12-06 NOTE — Progress Notes (Unsigned)
THERAPIST PROGRESS NOTE  Session Time: 4:00PM-4:57PM  Participation Level: Active  Behavioral Response: Casual, Neat, and Well GroomedAlertEuthymic  Type of Therapy: Individual Therapy  Treatment Goals addressed:   Reduce overall frequency, intensity and duration of anxiety so that daily functioning is not impaired per pt self report 3 out of 5 sessions.    Reduce the amount of anger-related incidents/outbursts by 50% as evidenced by self-report   ProgressTowards Goals: Progressing  Interventions: CBT, DBT, Strength-based, Reframing, and Other: ACT  Summary: Sergio [name] is a 47 y.o. male who presents with mixed sxs of anxiety and depression re: recently recovering from pancreatitis and wife having upcoming procedure and pt experiencing role change within his marriage. Pt endorsing crying spells, uncontrollable worry, anticipatory grief, lack of interest, difficulty sleeping, fatigue, difficulty concentrating, and difficulty relaxing. Pt oriented to person, place, and time.   Pt processed recent experience endorsing a pancreatitis flare up. Pt reported following medical advice and ceased food consumption for 4 days to assist his body in resetting. Pt reported he took pain medication as prescribed Sunday and Monday, had an infusion Monday, and today reports feeling physically better.   Pt reports that he has to view his relationship with food like he has an addiction in order to honor the diet that has been prescribed to him by his providers. Pt reports that this mindset has assisted him in better taking care of himself.   Pt reports that he continues to go to [facility] support group 2x/week to find community with other people who also have chronic illnesses. Pt reports that he is talking a conference about his experiences in a couple of months.   Pt processed emotions re: his wife's upcoming procedure and shared that he is experiencing difficulty being the caregiver  instead of the pt. Pt reports that he has been crying whenever he thinks about the procedure. Invited pt to utilize therapeutic space to understand why he is crying. Pt reported that the tears to him are from not being able to control the situation and from loving his wife. Practiced sitting in discomfort and explored the concept of neutrality over positivity when facing difficulty experiences. Provided pt psychoed re: difference between depression and circumstantial sadness/fear in order to validate emotions present. Invited pt to explore the bothness of his emotions of both trusting God and also being worried about the situation.   Provided psychoeducation to pt re: anticipatory grief. Discussed being mindful of crossing bridges before life has presented one to cross.   Pt shared that his wife is the caring matriarch of the family and that he is the [place] patriarch with a soft side buried underneath the gruffness. Invited pt to practice acceptance of this exploration of a new role of being a caregiver. Discussed love being education and invited pt to learn from wife how to be a caregiver and invited pt to offer wife support on how to be the pt.   Practiced reframing tears as a sign of weakness and instead viewing them as liquid reminders to name our struggles or to tell our loved ones that we love them.   Pt identified coping skills to be prayer, getting outside, and helping others.   Pt identified support system to be his friends at [employer] and his church community.   Pt reported that these skills feel helpful to his situation.   Suicidal/Homicidal: Nowithout intent/plan  Therapist Response:  LCSW practiced active listening and met pt with a strengths based  perspective to assist pt in exploring strengths in moments of feeling weak.   LCSW introduced pt to Acceptance and Commitment Therapy. Pt engaged in discussion on how to explore what they must accept in order to commit to what  they have identified as important. Introduced pt to values directed goal exploration in order to identify goals of importance.    Introduced pt to Dialectical Behavior Therapy and the importance of acceptance and change. Discussed concept of radical acceptance and also assisted pt in learning barriers to engaging in radical acceptance in journey towards change. Discussed importance of leaning into the dialectic and taught pt "and also" statements to assist in engaging with the bothness of change.    Provided pt psychoeducation re: anticipatory grief.   Provided pt homework of engaging with education with wife so they can best teach each other how to fulfill their new roles.   Plan: Return again in 2 weeks.  Diagnosis: Anxiety disorder, unspecified type  MDD (major depressive disorder), recurrent episode, mild ([place])  Collaboration of Care: Psychiatrist [facility] Dr. Shea Evans  Patient/Guardian was advised Release of Information must be obtained prior to any record release in order to collaborate their care with an outside provider. Patient/Guardian was advised if they have not already done so to contact the registration department to sign all necessary forms in order for [place] to release information regarding their care.   Consent: Patient/Guardian gives verbal consent for treatment and assignment of benefits for services provided during this visit. Patient/Guardian expressed understanding and agreed to proceed.   Blair Dolphin, LCSW 12/06/2022

## 2022-12-14 DIAGNOSIS — G4733 Obstructive sleep apnea (adult) (pediatric): Secondary | ICD-10-CM | POA: Diagnosis not present

## 2022-12-18 DIAGNOSIS — G4733 Obstructive sleep apnea (adult) (pediatric): Secondary | ICD-10-CM | POA: Diagnosis not present

## 2022-12-20 ENCOUNTER — Ambulatory Visit: Payer: No Typology Code available for payment source | Admitting: Licensed Clinical Social Worker

## 2022-12-20 DIAGNOSIS — F419 Anxiety disorder, unspecified: Secondary | ICD-10-CM

## 2022-12-20 DIAGNOSIS — F330 Major depressive disorder, recurrent, mild: Secondary | ICD-10-CM | POA: Diagnosis not present

## 2022-12-21 NOTE — Progress Notes (Signed)
THERAPIST PROGRESS NOTE  Session Time: 4:00PM-4:55PM  Participation Level: Active  Behavioral Response: Casual, Neat, and Well GroomedAlertDepressed  Type of Therapy: Individual Therapy  Treatment Goals addressed:  Reduce overall frequency, intensity and duration of depression so that daily functioning is not impaired per pt self report 3 out of 5 sessions documented.   Reduce overall frequency, intensity and duration of anxiety so that daily functioning is not impaired per pt self report 3 out of 5 sessions.   Pt will explore coping skills and learn coping and grounding skills to reduce symptoms of PTSD and prepare to handle future stressful situations as evidenced by implementing coping skills per pt report 3 out of 5 documented sessions.   Sergio [name] will reduce the amount of anger-related incidents/outbursts by 50% as evidenced by self-report.  ProgressTowards Goals: Progressing  Interventions: CBT, DBT, Solution Focused, Strength-based, and Other: ACT  Summary: Sergio [name] is a 47 y.o. male who presents with continued sxs of anxiety and depression re: wife's health and son's health. Sxs endorsed include but are not limited to irregular sleep, lack of interest, feeling depressed, negative self affect, worry, difficulty controlling worry, difficulty concentrating, and irritability. Pt oriented to person, place, and time. Pt denies SI/HI or A/V hallucinations. Pt was cooperative during visit and was engaged throughout the visit. Pt does not report any other concerns at the time of visit.  Pt reported that his wife's procedure went well and pt reported feeling more competent in how to assume caregiver role. Pt reported that he practiced skills discussed in previous session and shared that learning how to caregive and teaching how to be the pt assisted pt and his wife in getting through experience.   Pt reported that his newest concern is re: his son who he shared was admitted inpatient  due to increased mental health concerns. Pt utilized therapeutic space to process difficulties tied to being part of son's support system during this time. Pt named concern of anger being response to son's behaviors. Introduced pt to emotion regulation work and provided psychoed re: role of emotions and how to develop working relationship with emotions. Shared with patient that getting rid of emotions may be dangerous as they do serve a purpose for pt. Discussed importance of working alongside emotions as opposed to being controlled by emotions. Explored the space between stimulus and response to assist pt in finding space to control emotions and choose his response to the stimulus that risks increasing pt emotional reactivity.   Pt reported that he is coping by going to the mountains this weekend and spending time with his wife and son to assist in providing them all a reset post son's hospitalization.    Pt reported improvements in utilizing distress tolerance skill of radical acceptance. Pt identified ways they have exercised control and how they discerned radical acceptance to be a necessary skill for their situation. Pt shared barriers to radical acceptance and discussed ways they plan to overcome those barriers in order to best utilize radical acceptance.    LCSW provided mood monitoring and treatment progress review in the context of this episode of treatment. LCSW reviewed the pt's mood status since last session.   Pt is continuing to apply interventions/techniques learned in session into daily life situations. Pt is currently on track to meet goals utilizing interventions that are discussed in session. Treatment to continue as indicated. Personal growth and progress toward goals noted above.  Continued Recommendations as followed: Self-care behaviors, positive social engagements,  focusing on positive physical and emotional wellness, and focusing on life/work balance.   Suicidal/Homicidal:  Nowithout intent/plan  Therapist Response:  Approached pt with strengths based perspective to assist pt in exploring strengths in moments of feeling low.    Provided pt education re: acceptance. Discussed how to make acceptance accessible at all parts of pt's healing journey.    LCSW practiced active listening to validate pt participation, build rapport, and create safe space for pt to feel heard as they are disclosing their thoughts and feelings.    LCSW utilized therapeutic conversation skills informed by CBT, DBT, and ACT to expose pt to multiple ways of thinking about healing and to provide pt to access to multiple interventions.   Introduced pt to emotion regulation work and provided psychoed re: role of emotions and how to develop working relationship with emotions. Shared with patient that getting rid of emotions may be dangerous as they do serve a purpose for pt. Discussed importance of working alongside emotions as opposed to being controlled by emotions.    Plan: Return again in 3 weeks.  Diagnosis: MDD (major depressive disorder), recurrent episode, mild (HCC)  Anxiety disorder, unspecified type    12/20/2022    4:55 PM 12/06/2022    5:03 PM 11/11/2022   11:37 AM  Depression screen PHQ 2/9  Decreased Interest 1 1 1   Down, Depressed, Hopeless 1 1 1   PHQ - 2 Score 2 2 2   Altered sleeping 2 1 1   Tired, decreased energy 2 1 1   Change in appetite 1 0 0  Feeling bad or failure about yourself  2 0 0  Trouble concentrating 2 2 2   Moving slowly or fidgety/restless 0 0 0  Suicidal thoughts 0 0 0  PHQ-9 Score 11 6 6   Difficult doing work/chores Very difficult Somewhat difficult Somewhat difficult       12/20/2022    4:56 PM 12/06/2022    5:05 PM 11/11/2022   11:56 AM 10/26/2022   10:08 AM  GAD 7 : Generalized Anxiety Score  Nervous, Anxious, on Edge 2 1 2 1   Control/stop worrying 0 0 0 0  Worry too much - different things 0 0 0 0  Trouble relaxing 2 2 1 1   Restless 0 0 0 0   Easily annoyed or irritable 2 2 2 1   Afraid - awful might happen 0 0 0 0  Total GAD 7 Score 6 5 5 3   Anxiety Difficulty Somewhat difficult Somewhat difficult Somewhat difficult Somewhat difficult      Collaboration of Care: Psychiatrist AEB Dr. Shea Evans  Patient/Guardian was advised Release of Information must be obtained prior to any record release in order to collaborate their care with an outside provider. Patient/Guardian was advised if they have not already done so to contact the registration department to sign all necessary forms in order for [place] to release information regarding their care.   Consent: Patient/Guardian gives verbal consent for treatment and assignment of benefits for services provided during this visit. Patient/Guardian expressed understanding and agreed to proceed.   Blair Dolphin, LCSW 12/21/2022

## 2022-12-27 DIAGNOSIS — E1142 Type 2 diabetes mellitus with diabetic polyneuropathy: Secondary | ICD-10-CM | POA: Diagnosis not present

## 2022-12-27 DIAGNOSIS — E1129 Type 2 diabetes mellitus with other diabetic kidney complication: Secondary | ICD-10-CM | POA: Diagnosis not present

## 2022-12-27 DIAGNOSIS — E1165 Type 2 diabetes mellitus with hyperglycemia: Secondary | ICD-10-CM | POA: Diagnosis not present

## 2022-12-27 DIAGNOSIS — Z9641 Presence of insulin pump (external) (internal): Secondary | ICD-10-CM | POA: Diagnosis not present

## 2022-12-27 DIAGNOSIS — Z794 Long term (current) use of insulin: Secondary | ICD-10-CM | POA: Diagnosis not present

## 2022-12-27 DIAGNOSIS — R809 Proteinuria, unspecified: Secondary | ICD-10-CM | POA: Diagnosis not present

## 2022-12-27 DIAGNOSIS — E781 Pure hyperglyceridemia: Secondary | ICD-10-CM | POA: Diagnosis not present

## 2023-01-04 DIAGNOSIS — Z08 Encounter for follow-up examination after completed treatment for malignant neoplasm: Secondary | ICD-10-CM | POA: Diagnosis not present

## 2023-01-04 DIAGNOSIS — D2262 Melanocytic nevi of left upper limb, including shoulder: Secondary | ICD-10-CM | POA: Diagnosis not present

## 2023-01-04 DIAGNOSIS — D2271 Melanocytic nevi of right lower limb, including hip: Secondary | ICD-10-CM | POA: Diagnosis not present

## 2023-01-04 DIAGNOSIS — D2261 Melanocytic nevi of right upper limb, including shoulder: Secondary | ICD-10-CM | POA: Diagnosis not present

## 2023-01-04 DIAGNOSIS — Z86006 Personal history of melanoma in-situ: Secondary | ICD-10-CM | POA: Diagnosis not present

## 2023-01-04 DIAGNOSIS — X32XXXA Exposure to sunlight, initial encounter: Secondary | ICD-10-CM | POA: Diagnosis not present

## 2023-01-04 DIAGNOSIS — Z85828 Personal history of other malignant neoplasm of skin: Secondary | ICD-10-CM | POA: Diagnosis not present

## 2023-01-04 DIAGNOSIS — L570 Actinic keratosis: Secondary | ICD-10-CM | POA: Diagnosis not present

## 2023-01-04 DIAGNOSIS — D225 Melanocytic nevi of trunk: Secondary | ICD-10-CM | POA: Diagnosis not present

## 2023-01-11 ENCOUNTER — Ambulatory Visit (INDEPENDENT_AMBULATORY_CARE_PROVIDER_SITE_OTHER): Payer: 59 | Admitting: Licensed Clinical Social Worker

## 2023-01-11 DIAGNOSIS — F330 Major depressive disorder, recurrent, mild: Secondary | ICD-10-CM | POA: Diagnosis not present

## 2023-01-11 DIAGNOSIS — F419 Anxiety disorder, unspecified: Secondary | ICD-10-CM

## 2023-01-13 DIAGNOSIS — G4733 Obstructive sleep apnea (adult) (pediatric): Secondary | ICD-10-CM | POA: Diagnosis not present

## 2023-01-17 DIAGNOSIS — E1165 Type 2 diabetes mellitus with hyperglycemia: Secondary | ICD-10-CM | POA: Diagnosis not present

## 2023-01-17 DIAGNOSIS — E1129 Type 2 diabetes mellitus with other diabetic kidney complication: Secondary | ICD-10-CM | POA: Diagnosis not present

## 2023-01-17 DIAGNOSIS — E1142 Type 2 diabetes mellitus with diabetic polyneuropathy: Secondary | ICD-10-CM | POA: Diagnosis not present

## 2023-01-17 DIAGNOSIS — Z794 Long term (current) use of insulin: Secondary | ICD-10-CM | POA: Diagnosis not present

## 2023-01-17 DIAGNOSIS — E781 Pure hyperglyceridemia: Secondary | ICD-10-CM | POA: Diagnosis not present

## 2023-01-17 DIAGNOSIS — R809 Proteinuria, unspecified: Secondary | ICD-10-CM | POA: Diagnosis not present

## 2023-01-17 DIAGNOSIS — Z9641 Presence of insulin pump (external) (internal): Secondary | ICD-10-CM | POA: Diagnosis not present

## 2023-01-21 NOTE — Progress Notes (Signed)
THERAPIST PROGRESS NOTE  Session Time: 4:02PM-4:57PM  Participation Level: Active  Behavioral Response: Casual and NeatAlertAnxious  Type of Therapy: Individual Therapy  Treatment Goals addressed:  Reduce overall frequency, intensity and duration of depression so that daily functioning is not impaired per pt self report 3 out of 5 sessions documented.   Reduce overall frequency, intensity and duration of anxiety so that daily functioning is not impaired per pt self report 3 out of 5 sessions.    ProgressTowards Goals: Progressing  Interventions: CBT, DBT, Motivational Interviewing, Solution Focused, Strength-based, and Other: ACT  Summary: Sergio [name] is a 47 y.o. male who presents with mild sxs of anxiety and depression including but not limited to low mood, fatigue, irritability, worry, difficulty controlling worry, and disorganization. Pt oriented to person, place, and time. Pt denies SI/HI or A/V hallucinations. Pt was cooperative during visit and was engaged throughout the visit. Pt does not report any other concerns at the time of visit.  Pt reported that his son is home from the hospital and is requesting resources. Provided pt handout for IOP, PHP, and support groups run by [facility] along with the names of some therapists who could provide his son support.   Pt utilized therapeutic space to process grief re: grandmother being in hospice. Pt reported that he is assuming support system role for his mother during this time. Pt acknowledged that all people grieve differently.   Pt reported that he is struggling to implement organizational systems at work that would assist him in having more work life balance. Practiced problem solving with pt and provided pt space to brainstorm options and create an action plan. LCSW introduced pt to action planning. Pt practiced naming long term goal and worked with LCSW to define measurable "mile marker" goals that feel achievable to herself as she  is now to assist her in pursuing her long term goal.   Discussed time management skill and provided pt with a specialized to do list skill to assist pt in rebuilding mental stamina and improving task follow through. Informed pt that to complete the to do list, they must make a mass to do list at the beginning of the week and assign tasks to each day of the week being sure not to surpass more than 5 tasks a day (2 of which must be self care relevant). Pt is to create their own minimum using this to do list. If pt completes all 5 tasks for the day and feels energized to do more, they may complete a task for the next day. Pt should not replace that task on the following day's schedule until pt has engaged in mindfulness that following day after completing the remaining 4 tasks. This is to encourage listening to the body and to create room to assess stamina and reward pt with rest.   LCSW provided mood monitoring and treatment progress review in the context of this episode of treatment. LCSW reviewed the pt's mood status since last session.   Pt is continuing to apply interventions/techniques learned in session into daily life situations. Pt is currently on track to meet goals utilizing interventions that are discussed in session. Treatment to continue as indicated. Personal growth and progress toward goals noted above.  Continued Recommendations as followed: Self-care behaviors, positive social engagements, focusing on positive physical and emotional wellness, and focusing on life/work balance.     Suicidal/Homicidal: Nowithout intent/plan  Therapist Response:  Provided pt education re: acceptance. Discussed how to make acceptance  accessible at all parts of pt's healing journey.   Approached pt with strengths based perspective to assist pt in exploring strengths in moments of feeling low.   LCSW practiced active listening to validate pt participation, build rapport, and create safe space for pt to  feel heard as they are disclosing their thoughts and feelings.   LCSW utilized therapeutic conversation skills informed by CBT, DBT, and ACT to expose pt to multiple ways of thinking about healing and to provide pt to access to multiple interventions.  Practiced problem solving skills with pt.   Plan: Return again in 2 weeks.  Diagnosis: MDD (major depressive disorder), recurrent episode, mild  Anxiety disorder, unspecified type    01/11/2023    4:53 PM 12/20/2022    4:56 PM 12/06/2022    5:05 PM 11/11/2022   11:56 AM  GAD 7 : Generalized Anxiety Score  Nervous, Anxious, on Edge 1 2 1 2   Control/stop worrying 0 0 0 0  Worry too much - different things 0 0 0 0  Trouble relaxing 1 2 2 1   Restless 0 0 0 0  Easily annoyed or irritable 1 2 2 2   Afraid - awful might happen 0 0 0 0  Total GAD 7 Score 3 6 5 5   Anxiety Difficulty Somewhat difficult Somewhat difficult Somewhat difficult Somewhat difficult       01/11/2023    4:53 PM 12/20/2022    4:55 PM 12/06/2022    5:03 PM  Depression screen PHQ 2/9  Decreased Interest 1 1 1   Down, Depressed, Hopeless 1 1 1   PHQ - 2 Score 2 2 2   Altered sleeping 1 2 1   Tired, decreased energy 1 2 1   Change in appetite 0 1 0  Feeling bad or failure about yourself  1 2 0  Trouble concentrating 1 2 2   Moving slowly or fidgety/restless 0 0 0  Suicidal thoughts 0 0 0  PHQ-9 Score 6 11 6   Difficult doing work/chores Somewhat difficult Very difficult Somewhat difficult    Collaboration of Care: Psychiatrist AEB Dr. Elna Breslow  Patient/Guardian was advised Release of Information must be obtained prior to any record release in order to collaborate their care with an outside provider. Patient/Guardian was advised if they have not already done so to contact the registration department to sign all necessary forms in order for [place] to release information regarding their care.   Consent: Patient/Guardian gives verbal consent for treatment and assignment of benefits  for services provided during this visit. Patient/Guardian expressed understanding and agreed to proceed.   Geoffry Paradise, LCSW 01/21/2023

## 2023-01-24 ENCOUNTER — Ambulatory Visit: Payer: 59 | Admitting: Licensed Clinical Social Worker

## 2023-01-24 DIAGNOSIS — F330 Major depressive disorder, recurrent, mild: Secondary | ICD-10-CM

## 2023-01-24 DIAGNOSIS — F419 Anxiety disorder, unspecified: Secondary | ICD-10-CM

## 2023-02-09 NOTE — Progress Notes (Signed)
THERAPIST PROGRESS NOTE  Session Time: 4:00PM-4:45PM  Participation Level: Active  Behavioral Response: Casual and NeatAlertAnxious  Type of Therapy: Individual Therapy  Treatment Goals addressed:  Reduce overall frequency, intensity and duration of depression so that daily functioning is not impaired per pt self report 3 out of 5 sessions documented.   Reduce overall frequency, intensity and duration of anxiety so that daily functioning is not impaired per pt self report 3 out of 5 sessions.   Sergio [name] will reduce the amount of anger-related incidents/outbursts by 50% as evidenced by self-report  ProgressTowards Goals: Progressing  Interventions: CBT, DBT, Motivational Interviewing, Solution Focused, Strength-based, and Other: ACT  Summary: Sergio [name] is a 47 y.o. male who presents with mild sxs of anxiety and depression including but not limited to low mood, fatigue, irritability, worry, and difficulty controlling worry. Pt oriented to person, place, and time. Pt denies SI/HI or A/V hallucinations. Pt was cooperative during visit and was engaged throughout the visit. Pt does not report any other concerns at the time of visit.  Pt utilized therapeutic space to explore why he lacks desire to engage in health maintenance practices such as regularly using medications and insulin as prescribed. 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 Discussed role confusion and role dissonance contributing to lack of motivation.   Invited pt to discuss concerns of medication not assisting to providers. Pt reported desiring noticeable improvements from treatments. Provided pt metaphor of houses not looking dirty after one day of neglect but after several weeks, we may notice a difference.   Invited pt to practice showing up for self the way he encourages his wife to show up for herself and her health.   LCSW provided mood monitoring and treatment  progress review in the context of this episode of treatment. LCSW reviewed the pt's mood status since last session.   Pt is continuing to apply interventions/techniques learned in session into daily life situations. Pt is currently on track to meet goals utilizing interventions that are discussed in session. Treatment to continue as indicated. Personal growth and progress toward goals noted above.  Continued Recommendations as followed: Self-care behaviors, positive social engagements, focusing on positive physical and emotional wellness, and focusing on life/work balance.    Suicidal/Homicidal: Nowithout intent/plan  Therapist Response:  Provided pt education re: acceptance. Discussed how to make acceptance accessible at all parts of pt's healing journey.   LCSW practiced active listening to validate pt participation, build rapport, and create safe space for pt to feel heard as they are disclosing their thoughts and feelings.   Approached pt with strengths based perspective to assist pt in exploring strengths in moments of feeling low.   LCSW utilized therapeutic conversation skills informed by CBT, DBT, and ACT to expose pt to multiple ways of thinking about healing and to provide pt to access to multiple interventions.  Practiced problem solving skills with pt.   Plan: Return again in 2 weeks.  Diagnosis: MDD (major depressive disorder), recurrent episode, mild (HCC)  Anxiety disorder, unspecified type    01/24/2023    4:57 PM 01/11/2023    4:53 PM 12/20/2022    4:56 PM 12/06/2022    5:05 PM  GAD 7 : Generalized Anxiety Score  Nervous, Anxious, on Edge 1 1 2 1   Control/stop worrying 0 0 0 0  Worry too much - different things 0 0 0 0  Trouble relaxing 1 1 2  2  Restless 1 0 0 0  Easily annoyed or irritable 1 1 2 2   Afraid - awful might happen 0 0 0 0  Total GAD 7 Score 4 3 6 5   Anxiety Difficulty Somewhat difficult Somewhat difficult Somewhat difficult Somewhat difficult        01/24/2023    4:55 PM 01/11/2023    4:53 PM 12/20/2022    4:55 PM  Depression screen PHQ 2/9  Decreased Interest 1 1 1   Down, Depressed, Hopeless 1 1 1   PHQ - 2 Score 2 2 2   Altered sleeping 1 1 2   Tired, decreased energy 1 1 2   Change in appetite 0 0 1  Feeling bad or failure about yourself  0 1 2  Trouble concentrating 1 1 2   Moving slowly or fidgety/restless 0 0 0  Suicidal thoughts 0 0 0  PHQ-9 Score 5 6 11   Difficult doing work/chores Somewhat difficult Somewhat difficult Very difficult    Collaboration of Care: Psychiatrist AEB Dr. Elna Breslow  Patient/Guardian was advised Release of Information must be obtained prior to any record release in order to collaborate their care with an outside provider. Patient/Guardian was advised if they have not already done so to contact the registration department to sign all necessary forms in order for [place] to release information regarding their care.   Consent: Patient/Guardian gives verbal consent for treatment and assignment of benefits for services provided during this visit. Patient/Guardian expressed understanding and agreed to proceed.   Sergio Paradise, LCSW 01/24/2023

## 2023-02-12 DIAGNOSIS — G4733 Obstructive sleep apnea (adult) (pediatric): Secondary | ICD-10-CM | POA: Diagnosis not present

## 2023-02-13 DIAGNOSIS — E1165 Type 2 diabetes mellitus with hyperglycemia: Secondary | ICD-10-CM | POA: Diagnosis not present

## 2023-02-13 DIAGNOSIS — Z794 Long term (current) use of insulin: Secondary | ICD-10-CM | POA: Diagnosis not present

## 2023-02-13 DIAGNOSIS — E119 Type 2 diabetes mellitus without complications: Secondary | ICD-10-CM | POA: Diagnosis not present

## 2023-02-16 ENCOUNTER — Ambulatory Visit: Payer: 59 | Admitting: Licensed Clinical Social Worker

## 2023-02-16 DIAGNOSIS — F330 Major depressive disorder, recurrent, mild: Secondary | ICD-10-CM

## 2023-02-16 DIAGNOSIS — F419 Anxiety disorder, unspecified: Secondary | ICD-10-CM | POA: Diagnosis not present

## 2023-02-21 DIAGNOSIS — Z794 Long term (current) use of insulin: Secondary | ICD-10-CM | POA: Diagnosis not present

## 2023-02-21 DIAGNOSIS — E1129 Type 2 diabetes mellitus with other diabetic kidney complication: Secondary | ICD-10-CM | POA: Diagnosis not present

## 2023-02-21 DIAGNOSIS — Z9641 Presence of insulin pump (external) (internal): Secondary | ICD-10-CM | POA: Diagnosis not present

## 2023-02-21 DIAGNOSIS — E781 Pure hyperglyceridemia: Secondary | ICD-10-CM | POA: Diagnosis not present

## 2023-02-21 DIAGNOSIS — E1142 Type 2 diabetes mellitus with diabetic polyneuropathy: Secondary | ICD-10-CM | POA: Diagnosis not present

## 2023-02-21 DIAGNOSIS — R809 Proteinuria, unspecified: Secondary | ICD-10-CM | POA: Diagnosis not present

## 2023-02-21 DIAGNOSIS — E1165 Type 2 diabetes mellitus with hyperglycemia: Secondary | ICD-10-CM | POA: Diagnosis not present

## 2023-03-01 ENCOUNTER — Ambulatory Visit: Payer: No Typology Code available for payment source | Admitting: Licensed Clinical Social Worker

## 2023-03-09 DIAGNOSIS — Z794 Long term (current) use of insulin: Secondary | ICD-10-CM | POA: Diagnosis not present

## 2023-03-09 DIAGNOSIS — E1165 Type 2 diabetes mellitus with hyperglycemia: Secondary | ICD-10-CM | POA: Diagnosis not present

## 2023-03-09 DIAGNOSIS — E781 Pure hyperglyceridemia: Secondary | ICD-10-CM | POA: Diagnosis not present

## 2023-03-09 DIAGNOSIS — Z9641 Presence of insulin pump (external) (internal): Secondary | ICD-10-CM | POA: Diagnosis not present

## 2023-03-09 DIAGNOSIS — E1129 Type 2 diabetes mellitus with other diabetic kidney complication: Secondary | ICD-10-CM | POA: Diagnosis not present

## 2023-03-09 DIAGNOSIS — E1142 Type 2 diabetes mellitus with diabetic polyneuropathy: Secondary | ICD-10-CM | POA: Diagnosis not present

## 2023-03-09 DIAGNOSIS — R809 Proteinuria, unspecified: Secondary | ICD-10-CM | POA: Diagnosis not present

## 2023-03-15 ENCOUNTER — Ambulatory Visit (INDEPENDENT_AMBULATORY_CARE_PROVIDER_SITE_OTHER): Payer: 59 | Admitting: Licensed Clinical Social Worker

## 2023-03-15 DIAGNOSIS — F330 Major depressive disorder, recurrent, mild: Secondary | ICD-10-CM

## 2023-03-15 DIAGNOSIS — F419 Anxiety disorder, unspecified: Secondary | ICD-10-CM

## 2023-03-15 DIAGNOSIS — G4733 Obstructive sleep apnea (adult) (pediatric): Secondary | ICD-10-CM | POA: Diagnosis not present

## 2023-03-29 ENCOUNTER — Ambulatory Visit: Payer: 59 | Admitting: Licensed Clinical Social Worker

## 2023-03-29 DIAGNOSIS — F419 Anxiety disorder, unspecified: Secondary | ICD-10-CM

## 2023-03-29 DIAGNOSIS — E119 Type 2 diabetes mellitus without complications: Secondary | ICD-10-CM | POA: Diagnosis not present

## 2023-03-29 DIAGNOSIS — H40003 Preglaucoma, unspecified, bilateral: Secondary | ICD-10-CM | POA: Diagnosis not present

## 2023-03-29 DIAGNOSIS — F330 Major depressive disorder, recurrent, mild: Secondary | ICD-10-CM

## 2023-04-12 ENCOUNTER — Ambulatory Visit: Payer: 59 | Admitting: Licensed Clinical Social Worker

## 2023-04-26 ENCOUNTER — Ambulatory Visit: Payer: 59 | Admitting: Licensed Clinical Social Worker

## 2023-05-17 ENCOUNTER — Ambulatory Visit: Payer: 59 | Admitting: Licensed Clinical Social Worker

## 2023-05-17 DIAGNOSIS — F419 Anxiety disorder, unspecified: Secondary | ICD-10-CM

## 2023-05-17 DIAGNOSIS — F330 Major depressive disorder, recurrent, mild: Secondary | ICD-10-CM

## 2023-05-20 DIAGNOSIS — E1129 Type 2 diabetes mellitus with other diabetic kidney complication: Secondary | ICD-10-CM | POA: Diagnosis not present

## 2023-05-20 DIAGNOSIS — R809 Proteinuria, unspecified: Secondary | ICD-10-CM | POA: Diagnosis not present

## 2023-05-20 DIAGNOSIS — Z794 Long term (current) use of insulin: Secondary | ICD-10-CM | POA: Diagnosis not present

## 2023-05-22 NOTE — Progress Notes (Signed)
THERAPIST PROGRESS NOTE  Session Time: 4:13PM-5:00PM  Participation Level: Active  Behavioral Response: Casual and Well GroomedAlertAnxious and Depressed  Type of Therapy: Individual Therapy  Treatment Goals addressed:  Reduce overall frequency, intensity and duration of anxiety so that daily functioning is not impaired per pt self report 3 out of 5 sessions.   Reduce overall frequency, intensity and duration of depression so that daily functioning is not impaired per pt self report 3 out of 5 sessions documented.   Ihan will reduce the amount of anger-related incidents/outbursts by 50% as evidenced by self-report  ProgressTowards Goals: Progressing  Interventions: CBT, DBT, Strength-based, Reframing, and Other: ACT  Summary: LENUS DEMATTEO is a 47 y.o. male who presents with mild symptoms of anxiety and depression, including but not limited to irritability, fatigue, stress and worry, and difficulty controlling stress and worry. Patient oriented to person place in time. Pt denies SI/HI or A/V hallucinations. Pt was cooperative during visit and was engaged throughout the visit. Pt does not report any other concerns at the time of visit.  Patient utilized therapeutic space to process stress with his youngest son. Patient named that he has been managing his anger and response to his son's actions really well and reports that he has not had any anger outburst in the past several weeks. Patient identified ways he is supporting his son while also setting boundaries.   Patient utilized therapeutic space to process religious frustrations regarding being expected to serve in his church more than he originally signed up for. Patient identified ways he can practice setting boundaries with his church community while also honoring his desire to serve. Encourage patient that he can love his faith and also desire boundaries.   Patient reported that his physical health has improved drastically in the  past several months and he is excited to continue to maintain his health progress.   Patient reported no additional questions or concerns at this time.   LCSW provided mood monitoring and treatment progress review in the context of this episode of treatment. LCSW reviewed the pt's mood status since last session.   Pt is continuing to apply interventions/techniques learned in session into daily life situations. Pt is currently on track to meet goals utilizing interventions that are discussed in session. Treatment to continue as indicated. Personal growth and progress toward goals noted above.  Continued Recommendations as followed: Self-care behaviors, positive social engagements, focusing on positive physical and emotional wellness, and focusing on life/work balance.   Suicidal/Homicidal: Nowithout intent/plan  Therapist Response:  Provided pt education re: acceptance. Discussed how to make acceptance accessible at all parts of pt's healing journey.   Approached pt with strengths based perspective to assist pt in exploring strengths in moments of feeling low.   LCSW practiced active listening to validate pt participation, build rapport, and create safe space for pt to feel heard as they are disclosing their thoughts and feelings.   LCSW utilized therapeutic conversation skills informed by CBT, DBT, and ACT to expose pt to multiple ways of thinking about healing and to provide pt to access to multiple interventions.  Plan: Return again in 2 weeks.  Diagnosis: MDD (major depressive disorder), recurrent episode, mild (HCC)  Anxiety disorder, unspecified type    05/17/2023    4:55 PM 03/29/2023    4:59 PM 02/16/2023    3:57 PM 01/24/2023    4:57 PM  GAD 7 : Generalized Anxiety Score  Nervous, Anxious, on Edge 2 2 1  1  Control/stop worrying 0 0 0 0  Worry too much - different things 1 0 1 0  Trouble relaxing 2 2 0 1  Restless 0 0 0 1  Easily annoyed or irritable 2 2 1 1   Afraid - awful  might happen 0 0 0 0  Total GAD 7 Score 7 6 3 4   Anxiety Difficulty Somewhat difficult Somewhat difficult Somewhat difficult Somewhat difficult       05/17/2023    4:54 PM 03/29/2023    4:59 PM 02/16/2023    3:57 PM  Depression screen PHQ 2/9  Decreased Interest 1 2 1   Down, Depressed, Hopeless 1 2 1   PHQ - 2 Score 2 4 2   Altered sleeping 0 2 1  Tired, decreased energy 2 3 1   Change in appetite 0 0 0  Feeling bad or failure about yourself  0 2 0  Trouble concentrating 2 2 2   Moving slowly or fidgety/restless 0 0 0  Suicidal thoughts 0 0 0  PHQ-9 Score 6 13 6   Difficult doing work/chores Somewhat difficult Very difficult Somewhat difficult    Collaboration of Care: Psychiatrist AEB Dr. Elna Breslow  Patient/Guardian was advised Release of Information must be obtained prior to any record release in order to collaborate their care with an outside provider. Patient/Guardian was advised if they have not already done so to contact the registration department to sign all necessary forms in order for [place] to release information regarding their care.   Consent: Patient/Guardian gives verbal consent for treatment and assignment of benefits for services provided during this visit. Patient/Guardian expressed understanding and agreed to proceed.   Geoffry Paradise, LCSW 05/22/2023

## 2023-05-24 DIAGNOSIS — E1129 Type 2 diabetes mellitus with other diabetic kidney complication: Secondary | ICD-10-CM | POA: Diagnosis not present

## 2023-05-24 DIAGNOSIS — R809 Proteinuria, unspecified: Secondary | ICD-10-CM | POA: Diagnosis not present

## 2023-05-24 DIAGNOSIS — Z794 Long term (current) use of insulin: Secondary | ICD-10-CM | POA: Diagnosis not present

## 2023-05-24 DIAGNOSIS — E781 Pure hyperglyceridemia: Secondary | ICD-10-CM | POA: Diagnosis not present

## 2023-05-24 DIAGNOSIS — E1142 Type 2 diabetes mellitus with diabetic polyneuropathy: Secondary | ICD-10-CM | POA: Diagnosis not present

## 2023-05-31 ENCOUNTER — Ambulatory Visit: Payer: 59 | Admitting: Licensed Clinical Social Worker

## 2023-05-31 DIAGNOSIS — F419 Anxiety disorder, unspecified: Secondary | ICD-10-CM

## 2023-05-31 DIAGNOSIS — F330 Major depressive disorder, recurrent, mild: Secondary | ICD-10-CM

## 2023-06-20 NOTE — Progress Notes (Signed)
THERAPIST PROGRESS NOTE  Session Time: 3:04PM-4:00PM  Participation Level: Active  Behavioral Response: Casual and NeatAlertAnxious  Type of Therapy: Individual Therapy  Treatment Goals addressed:  Reduce overall frequency, intensity and duration of depression so that daily functioning is not impaired per pt self report 3 out of 5 sessions documented.   Reduce overall frequency, intensity and duration of anxiety so that daily functioning is not impaired per pt self report 3 out of 5 sessions.   Sergio [name] will reduce the amount of anger-related incidents/outbursts by 50% as evidenced by self-report  ProgressTowards Goals: Progressing  Interventions: CBT, DBT, Motivational Interviewing, Solution Focused, Strength-based, and Other: ACT  Summary: Sergio [name] is a 47 y.o. male who presents with mild sxs of anxiety and depression including but not limited to low mood, fatigue, irritability, worry, and difficulty controlling worry. Pt oriented to person, place, and time. Pt denies SI/HI or A/V hallucinations. Pt was cooperative during visit and was engaged throughout the visit. Pt does not report any other concerns at the time of visit.  Pt shared that he enjoyed the conference he attended and name improvements in mood.   Pt utilized therapeutic space to process work stress and practice problem solving.   Pt utilized therapeutic space to process parenting stress and anger management techniques.   Pt reported newfound desire to work on taking medical advice post previous session.    LCSW provided mood monitoring and treatment progress review in the context of this episode of treatment. LCSW reviewed the pt's mood status since last session.   Pt is continuing to apply interventions/techniques learned in session into daily life situations. Pt is currently on track to meet goals utilizing interventions that are discussed in session. Treatment to continue as indicated. Personal growth and  progress toward goals noted above.  Continued Recommendations as followed: Self-care behaviors, positive social engagements, focusing on positive physical and emotional wellness, and focusing on life/work balance.    Suicidal/Homicidal: Nowithout intent/plan  Therapist Response:  Provided pt education re: acceptance. Discussed how to make acceptance accessible at all parts of pt's healing journey.   LCSW practiced active listening to validate pt participation, build rapport, and create safe space for pt to feel heard as they are disclosing their thoughts and feelings.   Approached pt with strengths based perspective to assist pt in exploring strengths in moments of feeling low.   LCSW utilized therapeutic conversation skills informed by CBT, DBT, and ACT to expose pt to multiple ways of thinking about healing and to provide pt to access to multiple interventions.  Practiced problem solving skills with pt.   Plan: Return again in 2 weeks.  Diagnosis: MDD (major depressive disorder), recurrent episode, mild (HCC)  Anxiety disorder, unspecified type    05/17/2023    4:55 PM 03/29/2023    4:59 PM 02/16/2023    3:57 PM 01/24/2023    4:57 PM  GAD 7 : Generalized Anxiety Score  Nervous, Anxious, on Edge 2 2 1 1   Control/stop worrying 0 0 0 0  Worry too much - different things 1 0 1 0  Trouble relaxing 2 2 0 1  Restless 0 0 0 1  Easily annoyed or irritable 2 2 1 1   Afraid - awful might happen 0 0 0 0  Total GAD 7 Score 7 6 3 4   Anxiety Difficulty Somewhat difficult Somewhat difficult Somewhat difficult Somewhat difficult       05/17/2023    4:54 PM 03/29/2023  4:59 PM 02/16/2023    3:57 PM  Depression screen PHQ 2/9  Decreased Interest 1 2 1   Down, Depressed, Hopeless 1 2 1   PHQ - 2 Score 2 4 2   Altered sleeping 0 2 1  Tired, decreased energy 2 3 1   Change in appetite 0 0 0  Feeling bad or failure about yourself  0 2 0  Trouble concentrating 2 2 2   Moving slowly or  fidgety/restless 0 0 0  Suicidal thoughts 0 0 0  PHQ-9 Score 6 13 6   Difficult doing work/chores Somewhat difficult Very difficult Somewhat difficult    Collaboration of Care: Psychiatrist AEB Dr. Elna Breslow  Patient/Guardian was advised Release of Information must be obtained prior to any record release in order to collaborate their care with an outside provider. Patient/Guardian was advised if they have not already done so to contact the registration department to sign all necessary forms in order for [place] to release information regarding their care.   Consent: Patient/Guardian gives verbal consent for treatment and assignment of benefits for services provided during this visit. Patient/Guardian expressed understanding and agreed to proceed.   Geoffry Paradise, LCSW 01/24/2023

## 2023-06-21 NOTE — Progress Notes (Signed)
THERAPIST PROGRESS NOTE  Session Time: 4:07PM-4:44PM  Participation Level: Active  Behavioral Response: Casual and Well GroomedAlertAnxious and Depressed  Type of Therapy: Individual Therapy  Treatment Goals addressed:  Reduce overall frequency, intensity and duration of anxiety so that daily functioning is not impaired per pt self report 3 out of 5 sessions.   Reduce overall frequency, intensity and duration of depression so that daily functioning is not impaired per pt self report 3 out of 5 sessions documented.   Cross will reduce the amount of anger-related incidents/outbursts by 50% as evidenced by self-report  ProgressTowards Goals: Progressing  Interventions: CBT, DBT, Strength-based, Reframing, and Other: ACT  Summary: Sergio [name] is a 47 y.o. male who presents with mild symptoms of anxiety and depression, including but not limited to irritability, fatigue, stress and worry, and difficulty controlling stress and worry. Patient oriented to person place in time. Pt denies SI/HI or A/V hallucinations. Pt was cooperative during visit and was engaged throughout the visit. Pt does not report any other concerns at the time of visit.  Patient utilized therapeutic space to process stress with his youngest son. Patient named that he has been managing his anger and response to his son's actions really well and reports that he has not had any anger outburst in the past several weeks. Patient identified ways he is supporting his son while also setting boundaries.   Patient utilized therapeutic space to process religious frustrations regarding being expected to serve in his church more than he originally signed up for. Patient identified ways he can practice setting boundaries with his church community while also honoring his desire to serve. Encourage patient that he can love his faith and also desire boundaries.   Patient reported that his physical health has improved drastically in the  past several months and he is excited to continue to maintain his health progress.   Patient reported no additional questions or concerns at this time.   LCSW provided mood monitoring and treatment progress review in the context of this episode of treatment. LCSW reviewed the pt's mood status since last session.   Pt is continuing to apply interventions/techniques learned in session into daily life situations. Pt is currently on track to meet goals utilizing interventions that are discussed in session. Treatment to continue as indicated. Personal growth and progress toward goals noted above.  Continued Recommendations as followed: Self-care behaviors, positive social engagements, focusing on positive physical and emotional wellness, and focusing on life/work balance.   Suicidal/Homicidal: Nowithout intent/plan  Therapist Response:  Provided pt education re: acceptance. Discussed how to make acceptance accessible at all parts of pt's healing journey.   Approached pt with strengths based perspective to assist pt in exploring strengths in moments of feeling low.   LCSW practiced active listening to validate pt participation, build rapport, and create safe space for pt to feel heard as they are disclosing their thoughts and feelings.   LCSW utilized therapeutic conversation skills informed by CBT, DBT, and ACT to expose pt to multiple ways of thinking about healing and to provide pt to access to multiple interventions.  Plan: Transferring care to new therapist  Diagnosis: MDD (major depressive disorder), recurrent episode, mild (HCC)  Anxiety disorder, unspecified type    05/17/2023    4:55 PM 03/29/2023    4:59 PM 02/16/2023    3:57 PM 01/24/2023    4:57 PM  GAD 7 : Generalized Anxiety Score  Nervous, Anxious, on Edge 2 2 1  1  Control/stop worrying 0 0 0 0  Worry too much - different things 1 0 1 0  Trouble relaxing 2 2 0 1  Restless 0 0 0 1  Easily annoyed or irritable 2 2 1 1   Afraid  - awful might happen 0 0 0 0  Total GAD 7 Score 7 6 3 4   Anxiety Difficulty Somewhat difficult Somewhat difficult Somewhat difficult Somewhat difficult       05/17/2023    4:54 PM 03/29/2023    4:59 PM 02/16/2023    3:57 PM  Depression screen PHQ 2/9  Decreased Interest 1 2 1   Down, Depressed, Hopeless 1 2 1   PHQ - 2 Score 2 4 2   Altered sleeping 0 2 1  Tired, decreased energy 2 3 1   Change in appetite 0 0 0  Feeling bad or failure about yourself  0 2 0  Trouble concentrating 2 2 2   Moving slowly or fidgety/restless 0 0 0  Suicidal thoughts 0 0 0  PHQ-9 Score 6 13 6   Difficult doing work/chores Somewhat difficult Very difficult Somewhat difficult    Collaboration of Care: Psychiatrist AEB Dr. Elna Breslow  Patient/Guardian was advised Release of Information must be obtained prior to any record release in order to collaborate their care with an outside provider. Patient/Guardian was advised if they have not already done so to contact the registration department to sign all necessary forms in order for [place] to release information regarding their care.   Consent: Patient/Guardian gives verbal consent for treatment and assignment of benefits for services provided during this visit. Patient/Guardian expressed understanding and agreed to proceed.   Geoffry Paradise, LCSW 06/21/2023

## 2023-06-21 NOTE — Progress Notes (Signed)
THERAPIST PROGRESS NOTE  Session Time: 4:02PM-4:33PM  Participation Level: Active  Behavioral Response: Casual and NeatAlertAnxious  Type of Therapy: Individual Therapy  Treatment Goals addressed:  Reduce overall frequency, intensity and duration of depression so that daily functioning is not impaired per pt self report 3 out of 5 sessions documented.   Reduce overall frequency, intensity and duration of anxiety so that daily functioning is not impaired per pt self report 3 out of 5 sessions.   Sergio [name] will reduce the amount of anger-related incidents/outbursts by 50% as evidenced by self-report  ProgressTowards Goals: Progressing  Interventions: CBT, DBT, Motivational Interviewing, Solution Focused, Strength-based, and Other: ACT  Summary: Sergio [name] is a 47 y.o. male who presents with mild sxs of anxiety and depression including but not limited to low mood, fatigue, irritability, worry, and difficulty controlling worry. Pt oriented to person, place, and time. Pt denies SI/HI or A/V hallucinations. Pt was cooperative during visit and was engaged throughout the visit. Pt does not report any other concerns at the time of visit.  Pt shared that he enjoyed the conference he attended and name improvements in mood.   Pt utilized therapeutic space to process work stress and practice problem solving.   Pt utilized therapeutic space to process parenting stress and anger management techniques.   Pt reported newfound desire to work on taking medical advice post previous session.    LCSW provided mood monitoring and treatment progress review in the context of this episode of treatment. LCSW reviewed the pt's mood status since last session.   Pt is continuing to apply interventions/techniques learned in session into daily life situations. Pt is currently on track to meet goals utilizing interventions that are discussed in session. Treatment to continue as indicated. Personal growth and  progress toward goals noted above.  Continued Recommendations as followed: Self-care behaviors, positive social engagements, focusing on positive physical and emotional wellness, and focusing on life/work balance.    Suicidal/Homicidal: Nowithout intent/plan  Therapist Response:  Provided pt education re: acceptance. Discussed how to make acceptance accessible at all parts of pt's healing journey.   LCSW practiced active listening to validate pt participation, build rapport, and create safe space for pt to feel heard as they are disclosing their thoughts and feelings.   Approached pt with strengths based perspective to assist pt in exploring strengths in moments of feeling low.   LCSW utilized therapeutic conversation skills informed by CBT, DBT, and ACT to expose pt to multiple ways of thinking about healing and to provide pt to access to multiple interventions.  Practiced problem solving skills with pt.   Plan: Return again in 2 weeks.  Diagnosis: MDD (major depressive disorder), recurrent episode, mild (HCC)  Anxiety disorder, unspecified type    05/17/2023    4:55 PM 03/29/2023    4:59 PM 02/16/2023    3:57 PM 01/24/2023    4:57 PM  GAD 7 : Generalized Anxiety Score  Nervous, Anxious, on Edge 2 2 1 1   Control/stop worrying 0 0 0 0  Worry too much - different things 1 0 1 0  Trouble relaxing 2 2 0 1  Restless 0 0 0 1  Easily annoyed or irritable 2 2 1 1   Afraid - awful might happen 0 0 0 0  Total GAD 7 Score 7 6 3 4   Anxiety Difficulty Somewhat difficult Somewhat difficult Somewhat difficult Somewhat difficult       05/17/2023    4:54 PM 03/29/2023  4:59 PM 02/16/2023    3:57 PM  Depression screen PHQ 2/9  Decreased Interest 1 2 1   Down, Depressed, Hopeless 1 2 1   PHQ - 2 Score 2 4 2   Altered sleeping 0 2 1  Tired, decreased energy 2 3 1   Change in appetite 0 0 0  Feeling bad or failure about yourself  0 2 0  Trouble concentrating 2 2 2   Moving slowly or  fidgety/restless 0 0 0  Suicidal thoughts 0 0 0  PHQ-9 Score 6 13 6   Difficult doing work/chores Somewhat difficult Very difficult Somewhat difficult    Collaboration of Care: Psychiatrist AEB Dr. Elna [name]  Patient/Guardian was advised Release of Information must be obtained prior to any record release in order to collaborate their care with an outside provider. Patient/Guardian was advised if they have not already done so to contact the registration department to sign all necessary forms in order for [place] to release information regarding their care.   Consent: Patient/Guardian gives verbal consent for treatment and assignment of benefits for services provided during this visit. Patient/Guardian expressed understanding and agreed to proceed.   Memory Dance Sergio Vitug, LCSW

## 2023-06-21 NOTE — Progress Notes (Signed)
THERAPIST PROGRESS NOTE  Session Time: 3:03PM-3:56PM  Participation Level: Active  Behavioral Response: Casual and NeatAlertAnxious  Type of Therapy: Individual Therapy  Treatment Goals addressed:  Reduce overall frequency, intensity and duration of depression so that daily functioning is not impaired per pt self report 3 out of 5 sessions documented.   Reduce overall frequency, intensity and duration of anxiety so that daily functioning is not impaired per pt self report 3 out of 5 sessions.   Sergio [name] will reduce the amount of anger-related incidents/outbursts by 50% as evidenced by self-report  ProgressTowards Goals: Progressing  Interventions: CBT, DBT, Motivational Interviewing, Solution Focused, Strength-based, and Other: ACT  Summary: Sergio [name] is a 47 y.o. male who presents with mild sxs of anxiety and depression including but not limited to low mood, fatigue, irritability, worry, and difficulty controlling worry. Pt oriented to person, place, and time. Pt denies SI/HI or A/V hallucinations. Pt was cooperative during visit and was engaged throughout the visit. Pt does not report any other concerns at the time of visit.  Pt shared that he enjoyed the conference he attended and name improvements in mood.   Pt utilized therapeutic space to process work stress and practice problem solving.   Pt utilized therapeutic space to process parenting stress and anger management techniques.   Pt reported newfound desire to work on taking medical advice post previous session.    LCSW provided mood monitoring and treatment progress review in the context of this episode of treatment. LCSW reviewed the pt's mood status since last session.   Pt is continuing to apply interventions/techniques learned in session into daily life situations. Pt is currently on track to meet goals utilizing interventions that are discussed in session. Treatment to continue as indicated. Personal growth and  progress toward goals noted above.  Continued Recommendations as followed: Self-care behaviors, positive social engagements, focusing on positive physical and emotional wellness, and focusing on life/work balance.    Suicidal/Homicidal: Nowithout intent/plan  Therapist Response:  Provided pt education re: acceptance. Discussed how to make acceptance accessible at all parts of pt's healing journey.   LCSW practiced active listening to validate pt participation, build rapport, and create safe space for pt to feel heard as they are disclosing their thoughts and feelings.   Approached pt with strengths based perspective to assist pt in exploring strengths in moments of feeling low.   LCSW utilized therapeutic conversation skills informed by CBT, DBT, and ACT to expose pt to multiple ways of thinking about healing and to provide pt to access to multiple interventions.  Practiced problem solving skills with pt.   Plan: Return again in 2 weeks.  Diagnosis: MDD (major depressive disorder), recurrent episode, mild (HCC)  Anxiety disorder, unspecified type    05/17/2023    4:55 PM 03/29/2023    4:59 PM 02/16/2023    3:57 PM 01/24/2023    4:57 PM  GAD 7 : Generalized Anxiety Score  Nervous, Anxious, on Edge 2 2 1 1   Control/stop worrying 0 0 0 0  Worry too much - different things 1 0 1 0  Trouble relaxing 2 2 0 1  Restless 0 0 0 1  Easily annoyed or irritable 2 2 1 1   Afraid - awful might happen 0 0 0 0  Total GAD 7 Score 7 6 3 4   Anxiety Difficulty Somewhat difficult Somewhat difficult Somewhat difficult Somewhat difficult       05/17/2023    4:54 PM 03/29/2023  4:59 PM 02/16/2023    3:57 PM  Depression screen PHQ 2/9  Decreased Interest 1 2 1   Down, Depressed, Hopeless 1 2 1   PHQ - 2 Score 2 4 2   Altered sleeping 0 2 1  Tired, decreased energy 2 3 1   Change in appetite 0 0 0  Feeling bad or failure about yourself  0 2 0  Trouble concentrating 2 2 2   Moving slowly or  fidgety/restless 0 0 0  Suicidal thoughts 0 0 0  PHQ-9 Score 6 13 6   Difficult doing work/chores Somewhat difficult Very difficult Somewhat difficult    Collaboration of Care: Psychiatrist AEB Dr. Elna Breslow  Patient/Guardian was advised Release of Information must be obtained prior to any record release in order to collaborate their care with an outside provider. Patient/Guardian was advised if they have not already done so to contact the registration department to sign all necessary forms in order for [place] to release information regarding their care.   Consent: Patient/Guardian gives verbal consent for treatment and assignment of benefits for services provided during this visit. Patient/Guardian expressed understanding and agreed to proceed.   Memory Dance Sergio Boehringer, LCSW

## 2023-07-04 ENCOUNTER — Other Ambulatory Visit: Payer: Self-pay | Admitting: Psychiatry

## 2023-07-04 DIAGNOSIS — D485 Neoplasm of uncertain behavior of skin: Secondary | ICD-10-CM | POA: Diagnosis not present

## 2023-07-04 DIAGNOSIS — F331 Major depressive disorder, recurrent, moderate: Secondary | ICD-10-CM

## 2023-07-04 DIAGNOSIS — L538 Other specified erythematous conditions: Secondary | ICD-10-CM | POA: Diagnosis not present

## 2023-07-25 ENCOUNTER — Ambulatory Visit: Payer: 59 | Admitting: Licensed Clinical Social Worker

## 2023-07-25 DIAGNOSIS — F330 Major depressive disorder, recurrent, mild: Secondary | ICD-10-CM | POA: Diagnosis not present

## 2023-07-25 DIAGNOSIS — F419 Anxiety disorder, unspecified: Secondary | ICD-10-CM | POA: Diagnosis not present
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
## 2023-08-15 ENCOUNTER — Ambulatory Visit: Payer: 59 | Admitting: Licensed Clinical Social Worker

## 2023-08-15 DIAGNOSIS — F331 Major depressive disorder, recurrent, moderate: Secondary | ICD-10-CM

## 2023-08-15 NOTE — Progress Notes (Signed)
   THERAPIST PROGRESS NOTE  Session Time: 4:00pm-5:02pm  Participation Level: Active  Behavioral Response: CasualAlertEuthymic  Type of Therapy: Individual Therapy  Treatment Goals addressed: Reduce overall frequency, intensity and duration of depression so that daily functioning is not impaired per pt self report 3 out of 5 sessions documented.    ProgressTowards Goals: Progressing  Interventions: Solution Focused, Strength-based, and Supportive  Summary: Sergio [name] is a 47 y.o. male who presents with symptoms of anxiety and depression.  Patient reports uncontrollable worry and stress due to upcoming life events.  Patient oriented x 5.  Patient denies SI-HI or A-V hallucinations.  Patient was cooperative and engaged throughout session.  Patient began session by processing how his diagnosis of FCS has impacted his social life, mental health, physical wellbeing.  Patient discussed symptoms of FCS as well as his process of getting diagnosed.  Patient discussed limited treatment options and feelings related to meeting with pharmacologist and upcoming [place] conference for [facility].  He discussed feeling nervous about traveling due to inability to consume certain foods however patient demonstrated strength and solution focused mindset.  Processed patient's mentality of his personal belief; live every day to its fullest.  Patient shared about relationship with God and discovering his redefined purpose in life after his diagnosis.  Shared how social interactions have helped him "thrive "following some difficult events in life.   Suicidal/Homicidal: Nowithout intent/plan  Therapist Response: Patient presented for follow-up with clinician. Cln assessed for current stressors, symptoms, and safety issues last session.  Clinician utilized active listening and emotional validation to help the patient feel heard and understood while allowing safe space for the patient to express thoughts and feelings.   Clinician approached the patient with a strength-based perspective in assisting the patient in identifying his solution based mindset as well as praising opportunities where he had reframed unhelpful cognitions/avoided falling into thought traps.   Continued recommendations for treatment as followed: Patient will engage in self-care behaviors, positive social engagements, finding ways to focus on both physical and mental wellbeing, and engaging in mindful techniques to accomplish emotional regulation.  Plan: Return again in 2 weeks.  Diagnosis: MDD (major depressive disorder), recurrent episode, moderate (HCC)   Collaboration of Care: Check in with the patient and will see LCSW per availability. Patient agreed with treatment recommendations. Pt. is scheduled for a follow-up in 2 weeks.   Patient/Guardian was advised Release of Information must be obtained prior to any record release in order to collaborate their care with an outside provider. Patient/Guardian was advised if they have not already done so to contact the registration department to sign all necessary forms in order for [place] to release information regarding their care.   Consent: Patient/Guardian gives verbal consent for treatment and assignment of benefits for services provided during this visit. Patient/Guardian expressed understanding and agreed to proceed.   Dereck Leep, LCSW 08/15/2023

## 2023-09-01 DIAGNOSIS — E1142 Type 2 diabetes mellitus with diabetic polyneuropathy: Secondary | ICD-10-CM | POA: Diagnosis not present

## 2023-09-01 DIAGNOSIS — Z794 Long term (current) use of insulin: Secondary | ICD-10-CM | POA: Diagnosis not present

## 2023-09-01 DIAGNOSIS — R809 Proteinuria, unspecified: Secondary | ICD-10-CM | POA: Diagnosis not present

## 2023-09-01 DIAGNOSIS — E781 Pure hyperglyceridemia: Secondary | ICD-10-CM | POA: Diagnosis not present

## 2023-09-01 DIAGNOSIS — E1129 Type 2 diabetes mellitus with other diabetic kidney complication: Secondary | ICD-10-CM | POA: Diagnosis not present

## 2023-09-05 ENCOUNTER — Ambulatory Visit: Payer: 59 | Admitting: Licensed Clinical Social Worker

## 2023-09-05 DIAGNOSIS — F331 Major depressive disorder, recurrent, moderate: Secondary | ICD-10-CM

## 2023-09-05 NOTE — Progress Notes (Signed)
THERAPIST PROGRESS NOTE  Session Time: 11:00am-12:74mpm  Participation Level: Active  Behavioral Response: CasualAlertEuthymic  Type of Therapy: Individual Therapy  Treatment Goals addressed: Reduce overall frequency, intensity and duration of anxiety so that daily functioning is not impaired per pt self report 3 out of 5 sessions.   Reduce overall frequency, intensity and duration of depression so that daily functioning is not impaired per pt self report 3 out of 5 sessions documented.   ProgressTowards Goals: Progressing  Interventions: CBT, Motivational Interviewing, Solution Focused, Strength-based, and Supportive  Summary: Sergio [name] is a 47 y.o. male who presents with symptoms of anxiety and depression.  Patient reports symptoms of uncontrollable worry, negative mood, restlessness, irritability.  Patient was oriented x 5.  Patient was cooperative engaged.  Patient denies SI/HI/AVH.  Patient utilized therapeutic space to process financial stress related to changes in insurance coverage.  Patient reports he has encountered difficulties obtaining appropriate medication and expressed anxiety about uncertainty related to medical coverage come the new year.  Utilized therapeutic space to further explore anxious symptoms.  Reflected on recent conference he attended for [facility].  Patient expressed joy in his ability to connect with his support system and others diagnosed with FCS.  Patient reflected on all that he learned and how he can use this education when advocating for well-rounded medical care.  Patient identified he felt empowered advocating for his needs with an advisory board and 'Sergio [name]'.  Patient identified he continues to be present in this community to empower other people acknowledging "I have a voice."  Patient utilized strength-based approach and CBT techniques to identify a realistic lens and challenged unhelpful narratives.  Reflected on ways he can continue to work  towards his purpose and support others.  Briefly reflected on the recent holiday and anxiety he had related to attending a family outing.  Patient reflected on controlled versus uncontrollable factors and cites he was pleasantly surprised by how the family outing turned out.  Clinician assisted patient in identifying positive feedback he can give to relatives and appreciation for the support they were able to show him during the holiday season.   Suicidal/Homicidal: Nowithout intent/plan  Therapist Response: Clinician utilized supportive and active listening to create a safe environment for patient to process recent life stressors and symptoms.Cln. use solution focused therapy techniques to help patient identify and build upon personal strengths.  Challenged patient to break unhelpful thought patterns and explore alternative perspectives. Reviewed CBT thought challenging techniques and worked with patient to reframe unhelpful narratives.  Utilized strength-based approach to help patient reflected on motivational factors for improving health as well as purpose in supporting others in his efforts.    Plan: Return again in 2 weeks.  Diagnosis: MDD (major depressive disorder), recurrent episode, moderate (HCC)   Collaboration of Care: AEB psychiatrist can access notes and cln. Will review psychiatrists' notes. Check in with the patient and will see LCSW per availability. Patient agreed with treatment recommendations.   Patient/Guardian was advised Release of Information must be obtained prior to any record release in order to collaborate their care with an outside provider. Patient/Guardian was advised if they have not already done so to contact the registration department to sign all necessary forms in order for [place] to release information regarding their care.   Consent: Patient/Guardian gives verbal consent for treatment and assignment of benefits for services provided during this visit.  Patient/Guardian expressed understanding and agreed to proceed.   Dereck Leep, LCSW 09/05/2023

## 2023-09-06 DIAGNOSIS — D485 Neoplasm of uncertain behavior of skin: Secondary | ICD-10-CM | POA: Diagnosis not present

## 2023-09-06 DIAGNOSIS — L821 Other seborrheic keratosis: Secondary | ICD-10-CM | POA: Diagnosis not present

## 2023-09-06 DIAGNOSIS — E1142 Type 2 diabetes mellitus with diabetic polyneuropathy: Secondary | ICD-10-CM | POA: Diagnosis not present

## 2023-09-06 DIAGNOSIS — L570 Actinic keratosis: Secondary | ICD-10-CM | POA: Diagnosis not present

## 2023-09-06 DIAGNOSIS — Z86006 Personal history of melanoma in-situ: Secondary | ICD-10-CM | POA: Diagnosis not present

## 2023-09-06 DIAGNOSIS — D2262 Melanocytic nevi of left upper limb, including shoulder: Secondary | ICD-10-CM | POA: Diagnosis not present

## 2023-09-06 DIAGNOSIS — D225 Melanocytic nevi of trunk: Secondary | ICD-10-CM | POA: Diagnosis not present

## 2023-09-06 DIAGNOSIS — D2261 Melanocytic nevi of right upper limb, including shoulder: Secondary | ICD-10-CM | POA: Diagnosis not present

## 2023-09-06 DIAGNOSIS — E1165 Type 2 diabetes mellitus with hyperglycemia: Secondary | ICD-10-CM | POA: Diagnosis not present

## 2023-09-06 DIAGNOSIS — E781 Pure hyperglyceridemia: Secondary | ICD-10-CM | POA: Diagnosis not present

## 2023-09-06 DIAGNOSIS — R208 Other disturbances of skin sensation: Secondary | ICD-10-CM | POA: Diagnosis not present

## 2023-09-06 DIAGNOSIS — Z85828 Personal history of other malignant neoplasm of skin: Secondary | ICD-10-CM | POA: Diagnosis not present

## 2023-09-22 ENCOUNTER — Ambulatory Visit: Payer: 59 | Admitting: Licensed Clinical Social Worker

## 2023-09-22 DIAGNOSIS — F331 Major depressive disorder, recurrent, moderate: Secondary | ICD-10-CM | POA: Diagnosis not present

## 2023-09-22 NOTE — Progress Notes (Signed)
   THERAPIST PROGRESS NOTE  Session Time: 3:03pm-4:00pm  Participation Level: Active  Behavioral Response: CasualAlertEuthymic  Type of Therapy: Individual Therapy  Treatment Goals addressed: Reduce overall frequency, intensity and duration of anxiety so that daily functioning is not impaired per pt self report 3 out of 5 sessions.    Reduce overall frequency, intensity and duration of depression so that daily functioning is not impaired per pt self report 3 out of 5 sessions documented.   ProgressTowards Goals: Progressing  Interventions: Solution Focused, Supportive, and Social Skills Training  Summary: [name] is a 47 y.o. male who presents with symptoms of depression.  Patient reports symptoms of uncontrollable worry, negative mood, restlessness, irritability.  Patient was oriented x 5.  Patient was cooperative engaged.  Patient denies SI/HI/AVH.   Patient utilized therapeutic space to process recent diagnosis of autism with his son.  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  Addressed ways in which he plans to problem solve and intervene.  Reflected on ways in which he can appropriately support his sons both as a father and as a boss.  Reflected on current family dynamics and ways in which he can continue to foster both a healthy work environment and a healthy home environment.  Patient reflected on relief he feels now that he has hired additional support and expressed hope to focus more on his personal self-care.  Explored ways in which patient can approach his oldest son in an upcoming conversation.  Problem solved different approaches and worked together to establish the best route to take when communicating expectations with his oldest son while supporting fostering an environment in which his son's diagnosis is considered.  Suicidal/Homicidal: Nowithout intent/plan  Therapist Response:   Clinician utilized supportive and active listening to create a safe environment for patient to process recent life stressors and symptoms. Cln. use solution focused therapy techniques to help patient identify and build upon personal strengths.  Supported patient in processing recent diagnosis of autism with his son.  Worked with patient to identify solutions and supporting his sons both personally and professionally.  Educated patient on symptoms of autism and ways in which he can effectively communicate/support his son moving forward.  Plan: Return again in 3 weeks.  Diagnosis: MDD (major depressive disorder), recurrent episode, moderate (HCC)   Collaboration of Care: AEB psychiatrist can access notes and cln. Will review psychiatrists' notes. Check in with the patient and will see LCSW per availability. Patient agreed with treatment recommendations.   Patient/Guardian was advised Release of Information must be obtained prior to any record release in order to collaborate their care with an outside provider. Patient/Guardian was advised if they have not already done so to contact the registration department to sign all necessary forms in order for [place] to release information regarding their care.   Consent: Patient/Guardian gives verbal consent for treatment and assignment of benefits for services provided during this visit. Patient/Guardian expressed understanding and agreed to proceed.   Dereck Leep, LCSW 09/22/2023

## 2023-10-10 ENCOUNTER — Ambulatory Visit: Payer: 59 | Admitting: Licensed Clinical Social Worker

## 2023-10-19 ENCOUNTER — Ambulatory Visit: Payer: 59 | Admitting: Licensed Clinical Social Worker

## 2023-10-20 ENCOUNTER — Ambulatory Visit: Payer: 59 | Admitting: Licensed Clinical Social Worker

## 2023-10-20 DIAGNOSIS — F331 Major depressive disorder, recurrent, moderate: Secondary | ICD-10-CM

## 2023-10-20 NOTE — Progress Notes (Signed)
   THERAPIST PROGRESS NOTE  Session Time: 10:01am-10:54am  Participation Level: Active  Behavioral Response: CasualAlertEuthymic  Type of Therapy: Individual Therapy  Treatment Goals addressed: Reduce overall frequency, intensity and duration of anxiety so that daily functioning is not impaired per pt self report 3 out of 5 sessions.    Reduce overall frequency, intensity and duration of depression so that daily functioning is not impaired per pt self report 3 out of 5 sessions documented.   ProgressTowards Goals: Progressing  Interventions: CBT, Solution Focused, and Supportive  Summary: Sergio [name] is a 48 y.o. male who presents with symptoms of depression. Patient reports symptoms of uncontrollable worry, negative mood, restlessness, irritability. Patient was oriented x 5. Patient was cooperative engaged. Patient denies SI/HI/AVH.    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  Patient celebrated efforts to model professional behavior for his sons and notes improvements.  Processed relationship with children and supporting them through addiction.  Discussed ways in which patient can foster an environment where his children can confide in him as well as ways in which he can redirect behaviors.  Discussed patient's experience for the holidays with FCS and difficulties engaging in family traditions due to limitations with foods he can consume.  Patient identified support from his wife and celebrated efforts made to create new recipes he plans to bring next year in order to provide opportunities for him to celebrate with his family in traditions.  Patient identified he recently underwent genetic testing and genetic counseling.  Patient processed his experience.  Discussed feelings of invalidation and  ways in which he can share his experience with others diagnosed with FCS.  Explored feelings about his adoption and journey he is beginning to finding his birth parents.  Discussed intentions and boundaries.  Due to insurance changes, patient will be moving frequency of therapeutic session to 1 time a month.  Suicidal/Homicidal: Nowithout intent/plan  Therapist Response: Clinician utilized supportive and active listening to create a safe environment for patient to process recent life stressors and symptoms.  Clinician continue to support patient in discovering ways to model and communicate healthy boundaries.  Supported patient in reframing thoughts regarding his chronic illness and limitations participating in family traditions.  Celebrated patient's efforts to find solutions to his participation in creative ways.  Discussed ways in which patient can support loved ones through addiction.  Processed patient's feelings regarding journey to finding his biological parents.  Plan: Return again in 2 weeks.  Diagnosis: MDD (major depressive disorder), recurrent episode, moderate (HCC)   Collaboration of Care: Check in with the patient and will see LCSW per availability. Patient agreed with treatment recommendations.   Patient/Guardian was advised Release of Information must be obtained prior to any record release in order to collaborate their care with an outside provider. Patient/Guardian was advised if they have not already done so to contact the registration department to sign all necessary forms in order for us  to release information regarding their care.   Consent: Patient/Guardian gives verbal consent for treatment and assignment of benefits for services provided during this visit. Patient/Guardian expressed understanding and agreed to proceed.   Evalene KATHEE Husband, LCSW 10/20/2023

## 2023-11-03 DIAGNOSIS — D225 Melanocytic nevi of trunk: Secondary | ICD-10-CM | POA: Diagnosis not present

## 2023-11-03 DIAGNOSIS — D235 Other benign neoplasm of skin of trunk: Secondary | ICD-10-CM | POA: Diagnosis not present

## 2023-11-23 ENCOUNTER — Ambulatory Visit: Payer: 59 | Admitting: Licensed Clinical Social Worker

## 2023-11-23 DIAGNOSIS — F331 Major depressive disorder, recurrent, moderate: Secondary | ICD-10-CM

## 2023-11-23 NOTE — Progress Notes (Signed)
   THERAPIST PROGRESS NOTE  Session Time: 4:02pm-4:48pm  Participation Level: Active  Behavioral Response: CasualAlertEuthymic  Type of Therapy: Individual Therapy  Treatment Goals addressed: Reduce overall frequency, intensity and duration of anxiety so that daily functioning is not impaired per pt self report 3 out of 5 sessions.    Reduce overall frequency, intensity and duration of depression so that daily functioning is not impaired per pt self report 3 out of 5 sessions documented.   ProgressTowards Goals: Progressing  Interventions: Solution Focused, Supportive, and Other: Mindfulness  Summary: Sergio [name] is a 48 y.o. male who presents with symptoms of depression. Patient reports symptoms of uncontrollable worry, negative mood, restlessness, irritability. Patient was oriented x 5. Patient was cooperative engaged. Patient denies SI/HI/AVH.   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  Worked with clinician to identify ways in which she could establish and maintain healthy boundaries between work and professional relationships.  Patient provided an update in his journey to obtain medical answers as well as the possibility of reaching out to family in support of medical answers.  Patient reflected on sleep and his "addiction" to trazodone.  Clinician and patient worked together to review and improve sleep hygiene.  Clinician educated patient on bilateral tapping as a resource as well as thought dumping.  Patient also identified he will try returning to sleeping with sensory deprivation tools.  Suicidal/Homicidal: Nowithout intent/plan  Therapist Response: Clinician utilized supportive and active listening to create a safe environment for patient to process recent life stressors and symptoms. Cln. use solution focused therapy techniques to help patient improve sleep hygiene.  Plan: Return again in 4 weeks.  Diagnosis: MDD (major  depressive disorder), recurrent episode, moderate (HCC)   Collaboration of Care: AEB psychiatrist can access notes and cln. Will review psychiatrists' notes. Check in with the patient and will see LCSW per availability. Patient agreed with treatment recommendations.  Patient/Guardian was advised Release of Information must be obtained prior to any record release in order to collaborate their care with an outside provider. Patient/Guardian was advised if they have not already done so to contact the registration department to sign all necessary forms in order for [place] to release information regarding their care.   Consent: Patient/Guardian gives verbal consent for treatment and assignment of benefits for services provided during this visit. Patient/Guardian expressed understanding and agreed to proceed.   Dereck Leep, LCSW 11/23/2023

## 2023-12-05 DIAGNOSIS — E1165 Type 2 diabetes mellitus with hyperglycemia: Secondary | ICD-10-CM | POA: Diagnosis not present

## 2023-12-05 DIAGNOSIS — E1142 Type 2 diabetes mellitus with diabetic polyneuropathy: Secondary | ICD-10-CM | POA: Diagnosis not present

## 2023-12-05 DIAGNOSIS — E781 Pure hyperglyceridemia: Secondary | ICD-10-CM | POA: Diagnosis not present

## 2023-12-12 DIAGNOSIS — R809 Proteinuria, unspecified: Secondary | ICD-10-CM | POA: Diagnosis not present

## 2023-12-12 DIAGNOSIS — E1165 Type 2 diabetes mellitus with hyperglycemia: Secondary | ICD-10-CM | POA: Diagnosis not present

## 2023-12-12 DIAGNOSIS — E781 Pure hyperglyceridemia: Secondary | ICD-10-CM | POA: Diagnosis not present

## 2023-12-12 DIAGNOSIS — E1129 Type 2 diabetes mellitus with other diabetic kidney complication: Secondary | ICD-10-CM | POA: Diagnosis not present

## 2023-12-12 DIAGNOSIS — E1142 Type 2 diabetes mellitus with diabetic polyneuropathy: Secondary | ICD-10-CM | POA: Diagnosis not present

## 2023-12-12 DIAGNOSIS — Z794 Long term (current) use of insulin: Secondary | ICD-10-CM | POA: Diagnosis not present

## 2023-12-29 ENCOUNTER — Ambulatory Visit (INDEPENDENT_AMBULATORY_CARE_PROVIDER_SITE_OTHER): Payer: 59 | Admitting: Licensed Clinical Social Worker

## 2023-12-29 DIAGNOSIS — F419 Anxiety disorder, unspecified: Secondary | ICD-10-CM | POA: Diagnosis not present

## 2023-12-29 DIAGNOSIS — F3341 Major depressive disorder, recurrent, in partial remission: Secondary | ICD-10-CM

## 2023-12-29 NOTE — Progress Notes (Signed)
 THERAPIST PROGRESS NOTE  Session Time: 10-11am  Participation Level: Active  Behavioral Response: CasualAlertEuthymic  Type of Therapy: Individual Therapy  Treatment Goals addressed:                   Template: Anxiety Disorder         Problem: Anxiety      Dates: Start:  09/07/22       Description: Reduce overall frequency, intensity and duration of anxiety so that daily functioning is not impaired per pt self report 3 out of 5 sessions.      Disciplines: Interdisciplinary, Neuro Tech        Goal:  Anxiety      Dates: Start:  09/07/22    Expected End:  01/13/24       Description: Reduce overall frequency, intensity and duration of anxiety so that daily functioning is not impaired per pt self report 3 out of 5 sessions.      Disciplines: Interdisciplinary, Neuro Tech                                                   Template: Depression Disorder         Problem: Depression     Dates: Start:  09/07/22       Description: Reduce overall frequency, intensity and duration of depression so that daily functioning is not impaired per pt self report 3 out of 5 sessions documented.      Disciplines: Interdisciplinary, Neuro Tech, Counselor        Goal:  Depression      Dates: Start:  09/07/22    Expected End:  01/13/24       Description: Reduce overall frequency, intensity and duration of depression so that daily functioning is not impaired per pt self report 3 out of 5 sessions documented.     12/29/23: 75% progress; "I don't know that it will ever be 100%."    Disciplines: Interdisciplinary, Neuro Tech                                                Problem: OP Depression     Dates: Start:  12/06/22               Template: PTSD-Traiuma Disorder         Problem: Trauma      Dates: Start:  09/07/22       Description: Pt will explore coping skills and learn coping and grounding skills to reduce symptoms of PTSD and prepare to handle future stressful  situations as evidenced by implementing coping skills per pt report 3 out of 5 documented sessions.     12/29/23: Identifies he would like address his trauma hx moving forward.     Disciplines: Interdisciplinary, Neuro Tech                            No Associated Template         Problem: Communication:     Dates: Start:  09/07/22       Description: Learn three ways to communicate verbally when angry per pt report 3 out of 5 documented  sessions. Be able to express wants and needs through spoken language. Learn to express feelings verbally without acting out per pt report 3 out of 5 documented sessions.    12/29/23: "I think that has gotten better and being able to be honest."     Disciplines: Counselor      ProgressTowards Goals: Progressing  Interventions: CBT, Solution Focused, Supportive, and Reframing  Summary: Sergio [name] is a 48 y.o. male who presents with symptoms of depression. Patient reports symptoms of uncontrollable worry, negative mood, restlessness, irritability. Patient was oriented x 5. Patient was cooperative engaged. Patient denies SI/HI/AVH.    Clinician utilized therapeutic space to review patient's progress in therapy through review all of his treatment plan.  Patient notated improvements with anger and communication.  Patient expressed a desire to begin processing his trauma history through use of EMDR.  Clinician educated patient on components of EMDR and patient consented to beginning EMDR treatment next session.  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  Clinician worked with patient on exploring solutions such as delegating tasks with work.  Continue to explore ways in which patient can use assertive communication to establish healthy barriers with staff at work.  Patient provided updates on his disease of FCS.  Patient expressed hope based on  new medication he will be beginning should it get approved by insurance.  Patient reports increased concerns with recent patterns of behavior with foods, hiding an "addiction."  Patient stated he has been battling with cravings and sneaking foods that he knows are unhealthy for him.  Clinician worked with patient to identify ways in which he can address his restraints and solutions that have worked in the past.  Patient identified a need to seek support from his wife as she can serve as a point of accountability for him moving forward.  Patient also expressed a desire to construct healthy snack bags to leave in his car to reduce temptation.  Clinician educated patient on the 20-minute prolonged exposure technique and patient agreed to make efforts to try this technique.  Patient also expresses solution to utilize alternate gas stations who do not offer the option to purchase snacks.  Suicidal/Homicidal: Nowithout intent/plan  Therapist Response: Clinician utilized supportive and active listening to create a safe environment for patient to process recent life stressors and symptoms. Cln. use solution focused therapy techniques to help patient improve cravings food.  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  Plan: Return again in 2 weeks.  Diagnosis: MDD (major depressive disorder), recurrent, in partial remission (HCC)  Anxiety disorder, unspecified type   Collaboration of Care: Check in with the patient and will see LCSW per availability. Patient agreed with treatment recommendations  Patient/Guardian was advised Release of Information must be obtained prior to any record release in order to collaborate their care with an outside provider. Patient/Guardian was advised if they have not already done so to contact the registration department to sign all necessary forms in order for [place] to  release information regarding their care.   Consent: Patient/Guardian gives verbal consent for treatment and assignment of benefits for services provided during this visit. Patient/Guardian expressed understanding and agreed to proceed.   Dereck Leep, LCSW 12/29/2023

## 2024-01-30 ENCOUNTER — Ambulatory Visit: Payer: 59 | Admitting: Licensed Clinical Social Worker

## 2024-01-30 DIAGNOSIS — F3341 Major depressive disorder, recurrent, in partial remission: Secondary | ICD-10-CM | POA: Diagnosis not present

## 2024-01-30 DIAGNOSIS — F419 Anxiety disorder, unspecified: Secondary | ICD-10-CM | POA: Diagnosis not present

## 2024-01-30 NOTE — Progress Notes (Signed)
 THERAPIST PROGRESS NOTE  Session Time: 4-4:59pm  Participation Level: Active  Behavioral Response: CasualAlertEuthymic  Type of Therapy: Individual Therapy  Treatment Goals addressed:  Template: Anxiety Disorder              Problem: Anxiety      Dates: Start:  09/07/22       Description: Reduce overall frequency, intensity and duration of anxiety so that daily functioning is not impaired per pt self report 3 out of 5 sessions.      Disciplines: Interdisciplinary, Neuro Tech           Goal:  Anxiety      Dates: Start:  09/07/22    Expected End:  01/13/24       Description: Reduce overall frequency, intensity and duration of anxiety so that daily functioning is not impaired per pt self report 3 out of 5 sessions.      Disciplines: Interdisciplinary, Neuro Tech                                                   ProgressTowards Goals: Progressing  Interventions: Supportive, Reframing, and Other: Mindfulness  Summary: Sergio [name] is a 48 y.o. male who presents with symptoms of depression. Patient reports symptoms of uncontrollable worry, negative mood, restlessness, irritability. Patient was oriented x 5. Patient was cooperative engaged. Patient denies SI/HI/AVH.   The patient used the session to identify stressors related to his two jobs that are contributing to his anxiety and stress. He reported difficulty obtaining enough sleep and experiences tension headaches. To manage his anxious symptoms, he has been taking his Xanax  medication. The patient often feels a need for control, and when he encounters situations where he feels out of control, he tends to avoid communication with others because he feels overwhelmed. He noted that he observed his father handling situations in a similar manner while growing up. The patient expressed that he feels he is "spiraling."  During the session, the clinician and the patient reflected on his black-and-white thinking patterns and  high expectations. The patient also expressed concerns about possibly having a diagnosis of autism because he has observed similar behavior in his son, who has also been diagnosed with autism. The clinician highlighted the patient's belief that he can only rely on himself and that he constantly needs to maintain control, which the patient confirmed.  The clinician introduced the patient to several techniques for managing anxiety, including 7-11 breathing, acupressure breathing, and the 5 senses grounding technique. The patient stated that he will practice acupressure breathing using the 5 senses grounding technique regularly to help manage his anxious symptoms.  Additionally, the patient reported that he has been able to adhere to his dietary restrictions better and is no longer seeking out food that falls outside those limitations. He found the solutions discussed in previous sessions to be effective.  Suicidal/Homicidal: Nowithout intent/plan  Therapist Response:  Clinician utilized supportive and active listening to create a safe environment for patient to process recent life stressors and symptoms.  Clinician educated patient on mindfulness techniques to manage anxious symptoms.  Plan: Return again in 2 weeks.  Diagnosis: MDD (major depressive disorder), recurrent, in partial remission (HCC)  Anxiety disorder, unspecified type   Collaboration of Care: : AEB psychiatrist can access notes and cln. Will review psychiatrists' notes. Check in with  the patient and will see LCSW per availability. Patient agreed with treatment recommendations.   Patient/Guardian was advised Release of Information must be obtained prior to any record release in order to collaborate their care with an outside provider. Patient/Guardian was advised if they have not already done so to contact the registration department to sign all necessary forms in order for us  to release information regarding their care.   Consent:  Patient/Guardian gives verbal consent for treatment and assignment of benefits for services provided during this visit. Patient/Guardian expressed understanding and agreed to proceed.   Marvin Slot, LCSW 01/30/2024

## 2024-02-13 ENCOUNTER — Ambulatory Visit (INDEPENDENT_AMBULATORY_CARE_PROVIDER_SITE_OTHER): Admitting: Licensed Clinical Social Worker

## 2024-02-13 DIAGNOSIS — F3341 Major depressive disorder, recurrent, in partial remission: Secondary | ICD-10-CM

## 2024-02-13 DIAGNOSIS — F419 Anxiety disorder, unspecified: Secondary | ICD-10-CM | POA: Diagnosis not present

## 2024-02-13 NOTE — Progress Notes (Signed)
 THERAPIST PROGRESS NOTE  Session Time: 4-5:01pm  Participation Level: Active  Behavioral Response: CasualAlertEuthymic  Type of Therapy: Individual Therapy  Treatment Goals addressed:  Template: Anxiety Disorder                       Problem: Anxiety       Dates: Start:  09/07/22        Description: Reduce overall frequency, intensity and duration of anxiety so that daily functioning is not impaired per pt self report 3 out of 5 sessions.       Disciplines: Interdisciplinary, Neuro Tech            Goal:  Anxiety      Dates: Start:  09/07/22    Expected End:  01/13/24       Description: Reduce overall frequency, intensity and duration of anxiety so that daily functioning is not impaired per pt self report 3 out of 5 sessions.      Disciplines: Interdisciplinary, Neuro Tech        ProgressTowards Goals: Progressing  Interventions: CBT, Solution Focused, Supportive, and Reframing  Summary: Sergio [name] is a 48 y.o. male who presents with symptoms of depression. Patient reports symptoms of uncontrollable worry, negative mood, restlessness, irritability. Patient was oriented x 5. Patient was cooperative engaged. Patient denies SI/HI/AVH.    The clinician assessed the patient's use of breathing techniques discussed in the previous session. The patient reflected on a recent stressful situation that led to feelings of irritability. He reported that he successfully utilized the breathing techniques and managed to move on from the situation without acting on aggressive feelings.  The patient continues to struggle with procrastination regarding certain work tasks. Despite the clinician's efforts to assist him in problem-solving, the patient reported low motivation to complete these tasks.  The clinician evaluated the series of changes the patient is experiencing and how he is delegating tasks. 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 They addressed ways in which the patient can use these experiences to empathize with those he supervises.  The patient also expressed anxiety about his health, citing concerns regarding weight loss. He reflected on medical trauma related to a past surgical procedure. The patient and clinician worked together to identify actionable steps he can take to find answers regarding his current symptoms.  Suicidal/Homicidal: Nowithout intent/plan  Therapist Response: Clinician utilized supportive and active listening to create a safe environment for patient to process recent life stressors and symptoms.  Continue to work with patient through establishing solutions for life situations.  Also utilized CBT techniques to continue to address patient's negative cognitions regarding control.   Plan: Return again in 2 weeks.  Diagnosis: MDD (major depressive disorder), recurrent, in partial remission (HCC)  Anxiety disorder, unspecified type   Collaboration of Care: Check in with the patient and will see LCSW per availability. Patient agreed with treatment recommendations.   Patient/Guardian was advised Release of Information must be obtained prior to any record release in order to collaborate their care with an outside provider. Patient/Guardian was advised if they have not already done so to contact the registration department to sign all necessary forms in order for us  to release information regarding their care.   Consent: Patient/Guardian gives verbal consent for treatment and assignment of benefits for services provided during this visit. Patient/Guardian expressed understanding  and agreed to proceed.   Marvin Slot, LCSW 02/13/2024

## 2024-02-29 ENCOUNTER — Ambulatory Visit (INDEPENDENT_AMBULATORY_CARE_PROVIDER_SITE_OTHER): Admitting: Licensed Clinical Social Worker

## 2024-02-29 DIAGNOSIS — F3341 Major depressive disorder, recurrent, in partial remission: Secondary | ICD-10-CM | POA: Diagnosis not present

## 2024-02-29 DIAGNOSIS — F419 Anxiety disorder, unspecified: Secondary | ICD-10-CM

## 2024-02-29 NOTE — Progress Notes (Signed)
   THERAPIST PROGRESS NOTE  Session Time: 3:30-4:02pm  Participation Level: Active  Behavioral Response: CasualAlertEuthymic  Type of Therapy: Individual Therapy  Treatment Goals addressed: Template: Anxiety Disorder                            Problem: Anxiety        Dates: Start:  09/07/22         Description: Reduce overall frequency, intensity and duration of anxiety so that daily functioning is not impaired per pt self report 3 out of 5 sessions.        Disciplines: Interdisciplinary, Neuro Tech             Goal:  Anxiety      Dates: Start:  09/07/22    Expected End:  01/13/24       Description: Reduce overall frequency, intensity and duration of anxiety so that daily functioning is not impaired per pt self report 3 out of 5 sessions.      Disciplines: Interdisciplinary, Neuro Tech       ProgressTowards Goals: Progressing  Interventions: Solution Focused, Assertiveness Training, Supportive, and Reframing  Summary: ARTAVIOUS TREBILCOCK is a 48 y.o. male who presents with symptoms of depression. Patient reports symptoms of uncontrollable worry, negative mood, restlessness, irritability. Patient was oriented x 5. Patient was cooperative engaged. Patient denies SI/HI/AVH.   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  Patient continues to process ways in which she can release control and delegate tasks.  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  Patient identified he is feeling overwhelmed and burnout as he is taking on too many tasks and therefore sacrificing self-care.  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  Reflected on perspectives from other parties involved.  Identified controllable factors and solutions for better management of patient's schedule.  For homework, patient was  assigned completion of a controlled wheel in efforts to identify areas of his life in which he can release some control.  Suicidal/Homicidal: Nowithout intent/plan  Therapist Response:  Clinician utilized supportive and active listening to create a safe environment for patient to process recent life stressors and symptoms.  Continue to work with patient through establishing solutions for life situations.  Also utilized CBT techniques to continue to address patient's negative cognitions regarding control.  Role-played and addressed use of assertive communication to establish healthy boundaries.  Plan: Return again in 2 weeks.  Diagnosis: MDD (major depressive disorder), recurrent, in partial remission (HCC)  Anxiety disorder, unspecified type  Collaboration of Care: Check in with the patient and will see LCSW per availability. Patient agreed with treatment recommendations.   Patient/Guardian was advised Release of Information must be obtained prior to any record release in order to collaborate their care with an outside provider. Patient/Guardian was advised if they have not already done so to contact the registration department to sign all necessary forms in order for us  to release information regarding their care.   Consent: Patient/Guardian gives verbal consent for treatment and assignment of benefits for services provided during this visit. Patient/Guardian expressed understanding and agreed to proceed.   Marvin Slot, LCSW 02/29/2024

## 2024-03-26 ENCOUNTER — Ambulatory Visit: Admitting: Licensed Clinical Social Worker

## 2024-03-26 DIAGNOSIS — F3341 Major depressive disorder, recurrent, in partial remission: Secondary | ICD-10-CM

## 2024-03-26 DIAGNOSIS — F419 Anxiety disorder, unspecified: Secondary | ICD-10-CM

## 2024-03-26 NOTE — Progress Notes (Signed)
   THERAPIST PROGRESS NOTE  Session Time: 3-3:59pm  Participation Level: Active  Behavioral Response: CasualAlertEuthymic  Type of Therapy: Individual Therapy  Treatment Goals addressed: Reduce overall frequency, intensity and duration of anxiety so that daily functioning is not impaired per pt self report 3 out of 5 sessions.       Disciplines: Interdisciplinary, Neuro Tech            Goal:  Anxiety      Dates: Start:  09/07/22    Expected End:  01/13/24       Description: Reduce overall frequency, intensity and duration of anxiety so that daily functioning is not impaired per pt self report 3 out of 5 sessions.      Disciplines: Interdisciplinary, Neuro Tech    ProgressTowards Goals: Progressing  Interventions: Solution Focused, Strength-based, and Supportive  Summary:Sergio [name] is a 48 y.o. male who presents with symptoms of depression. Patient reports symptoms of uncontrollable worry, negative mood, restlessness, irritability. Patient was oriented x 5. Patient was cooperative engaged. Patient denies SI/HI/AVH.   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  Patient reports through this experience, he identified his purpose of changing lives and supporting those who are going through similar lived experiences.  Patient was tearful reflecting on the impact of being able to support a multitude of populations throughout this experience.  He reflected on future goals he has in mind to beginning continue to support this community.  Identified ways in which his experience has aided in him healing his own journey with medical trauma.   Suicidal/Homicidal: Nowithout intent/plan  Therapist Response: Clinician utilized supportive and active listening to create a safe environment for patient to process recent life stressors and symptoms.  Patient utilized space to reflect on previous  positive experience and ways in which she is utilizing his strengths to feed into his purpose of helping others.  Plan: Return again in 2 weeks.  Diagnosis: MDD (major depressive disorder), recurrent, in partial remission (HCC)  Anxiety disorder, unspecified type   Collaboration of Care: AEB psychiatrist can access notes and cln. Will review psychiatrists' notes. Check in with the patient and will see LCSW per availability. Patient agreed with treatment recommendations.   Patient/Guardian was advised Release of Information must be obtained prior to any record release in order to collaborate their care with an outside provider. Patient/Guardian was advised if they have not already done so to contact the registration department to sign all necessary forms in order for us  to release information regarding their care.   Consent: Patient/Guardian gives verbal consent for treatment and assignment of benefits for services provided during this visit. Patient/Guardian expressed understanding and agreed to proceed.   Marvin Slot, LCSW 03/26/2024

## 2024-03-29 DIAGNOSIS — R809 Proteinuria, unspecified: Secondary | ICD-10-CM | POA: Diagnosis not present

## 2024-03-29 DIAGNOSIS — E781 Pure hyperglyceridemia: Secondary | ICD-10-CM | POA: Diagnosis not present

## 2024-03-29 DIAGNOSIS — F419 Anxiety disorder, unspecified: Secondary | ICD-10-CM | POA: Diagnosis not present

## 2024-03-29 DIAGNOSIS — I10 Essential (primary) hypertension: Secondary | ICD-10-CM | POA: Diagnosis not present

## 2024-03-29 DIAGNOSIS — Z0000 Encounter for general adult medical examination without abnormal findings: Secondary | ICD-10-CM | POA: Diagnosis not present

## 2024-03-29 DIAGNOSIS — G4733 Obstructive sleep apnea (adult) (pediatric): Secondary | ICD-10-CM | POA: Diagnosis not present

## 2024-03-29 DIAGNOSIS — Z794 Long term (current) use of insulin: Secondary | ICD-10-CM | POA: Diagnosis not present

## 2024-03-29 DIAGNOSIS — Z1331 Encounter for screening for depression: Secondary | ICD-10-CM | POA: Diagnosis not present

## 2024-03-29 DIAGNOSIS — E1129 Type 2 diabetes mellitus with other diabetic kidney complication: Secondary | ICD-10-CM | POA: Diagnosis not present

## 2024-03-29 DIAGNOSIS — F331 Major depressive disorder, recurrent, moderate: Secondary | ICD-10-CM | POA: Diagnosis not present

## 2024-03-29 DIAGNOSIS — Z125 Encounter for screening for malignant neoplasm of prostate: Secondary | ICD-10-CM | POA: Diagnosis not present

## 2024-03-29 DIAGNOSIS — E559 Vitamin D deficiency, unspecified: Secondary | ICD-10-CM | POA: Diagnosis not present

## 2024-04-09 ENCOUNTER — Ambulatory Visit (HOSPITAL_BASED_OUTPATIENT_CLINIC_OR_DEPARTMENT_OTHER): Admitting: Internal Medicine

## 2024-04-09 VITALS — BP 130/80 | HR 74 | Ht 70.0 in | Wt 211.7 lb

## 2024-04-09 DIAGNOSIS — E1165 Type 2 diabetes mellitus with hyperglycemia: Secondary | ICD-10-CM

## 2024-04-09 DIAGNOSIS — Z8719 Personal history of other diseases of the digestive system: Secondary | ICD-10-CM

## 2024-04-09 DIAGNOSIS — E781 Pure hyperglyceridemia: Secondary | ICD-10-CM

## 2024-04-09 NOTE — Patient Instructions (Addendum)
 Medication Instructions:  START Tryngolza 80 mg subcutaneously once a month- Prior authorization process started *If you need a refill on your cardiac medications before your next appointment, please call your pharmacy*  Lab: NMR, LP(a) and ApoB in 4 months   Follow-Up: At [facility], you and your health needs are our priority.  As part of our continuing mission to provide you with exceptional heart care, our providers are all part of one team.  This team includes your primary Cardiologist (physician) and Advanced Practice Providers or APPs (Physician Assistants and Nurse Practitioners) who all work together to provide you with the care you need, when you need it.  Your next appointment:  TBD pending lab results and prior authorization

## 2024-04-09 NOTE — Progress Notes (Unsigned)
 LIPID CLINIC CONSULT NOTE  Chief Complaint:  High triglycerides, pancreatitits  Primary Care Physician: Harvey Gaetana CROME, NP  Primary Cardiologist:  None  HPI:  Sergio [name] is a 48 y.o. male who is being seen today for the evaluation of high triglycerides and pancreatitis at the request of Fields, Gaetana CROME, NP. This is a pleasant 48 year old male who referred himself for evaluation management of high triglycerides and pancreatitis.  He believes he has a familial hyperlipidemia.  Sergio [name] had an episode in 2013 where he was hospitalized for 2 weeks for pancreatitis.  His triglycerides were extremely high at 1 point he says over 28,000 but generally his triglycerides remain in the thousands.  He has previously been on a number of medical therapies including statins, fibrate's and fish oils managed by his endocrinologist however he said they ultimately did not see I die and he is not currently taking any of those medications.  He has been on a very strict diet.  Recent labs showed total cholesterol 195 and triglycerides 591.  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  As a disclaimer, I do participate in clinical research with both companies.   Sergio [name] went on to get genetic testing through [facility].  He provided that information today and it was sent through [facility].  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  Patiently, he was noted to have a variant in APO E (E3/E4) is often associated with high cholesterol and an increased risk of Alzheimer's type dementia.  2 variants in LPA were noted, which commonly cause elevated serum levels of LP(a).  2 genes affecting endothelial function, 3 variants associated with increased  risk of obesity and 1 variant associated with statin associated myopathy was noted.  He does have concomitant diabetes with recent A1c of 8.5%. Both parents were noted to have high cholesterol.  PMHx:  Past Medical History:  Diagnosis Date   Anxiety    Chicken pox    Depression    Diabetes mellitus    DM2   Familial hypertriglyceridemia    severe   Gastric ulcer 2009   HTN (hypertension)    Morbid obesity (HCC)    s/p bariatric sleeve surgery 01/2015   Recurrent acute pancreatitis    secondary to hypertriglyceridemia    Skin cancer    Sleep apnea 1999   uses CPAP    Past Surgical History:  Procedure Laterality Date   CHOLECYSTECTOMY     LAPAROSCOPIC GASTRIC SLEEVE RESECTION     LUMBAR DISC SURGERY  2008   TONSILLECTOMY  2003   VASECTOMY      FAMHx:  Family History  Adopted: Yes  Problem Relation Age of Onset   Hypertension Mother    Hyperlipidemia Mother    Hypertension Father    Hyperlipidemia Father     SOCHx:   reports that he has never smoked. He has never used smokeless tobacco. He reports that he does not drink alcohol and does not use drugs.  ALLERGIES:  Allergies  Allergen Reactions   Codeine Anaphylaxis   Iodinated Contrast Media Other (See Comments) and Anaphylaxis    Kidneys stop working KIDNEYS SHUT DOWN    ROS: Pertinent items noted in HPI and remainder of comprehensive ROS otherwise negative.  HOME MEDS: Current Outpatient Medications on File Prior to Visit  Medication Sig Dispense Refill   acetaminophen  (TYLENOL ) 325 MG tablet Take 2 tablets (650 mg total) by mouth every 6 (six) hours as needed for mild pain (or Fever >/= 101).     ALPRAZolam  (XANAX ) 0.5 MG tablet Take 0.5 mg by mouth as needed for anxiety.     pregabalin  (LYRICA ) 300 MG capsule Take 300 mg by mouth at bedtime.      traZODone  (DESYREL ) 100 MG tablet Take 0.5-1 tablets (50-100 mg total) by mouth at bedtime as needed for sleep. 90 tablet 1   TRULICITY 3 MG/0.5ML SOPN  Inject into the skin.     No current facility-administered medications on file prior to visit.    LABS/IMAGING: No results found for this or any previous visit (from the past 48 hours). No results found.  LIPID PANEL:    Component Value Date/Time   CHOL 698 (H) 12/19/2021 1953   CHOL 357 (H) 09/04/2014 0328   TRIG 770 (H) 12/29/2021 0357   TRIG 1,201 (H) 11/04/2014 0427   HDL NOT REPORTED DUE TO HIGH TRIGLYCERIDES 12/19/2021 1953   HDL SEE COMMENT 09/04/2014 0328   CHOLHDL NOT REPORTED DUE TO HIGH TRIGLYCERIDES 12/19/2021 1953   VLDL UNABLE TO CALCULATE IF TRIGLYCERIDE OVER 400 mg/dL 96/88/7976 8046   VLDL SEE COMMENT 09/04/2014 0328   LDLCALC UNABLE TO CALCULATE IF TRIGLYCERIDE OVER 400 mg/dL 96/88/7976 8046   LDLCALC SEE COMMENT 09/04/2014 0328   LDLDIRECT UNABLE TO CALCULATE IF TRIGLYCERIDE IS >1293 mg/dL 96/88/7976 8046    WEIGHTS: Wt Readings from Last 3 Encounters:  04/09/24 211 lb 11.2 oz (96 kg)  12/27/21 246 lb 4.1 oz (111.7 kg)  11/26/19 247 lb (112 kg)    VITALS: BP 130/80 (BP Location: Left Arm, Patient Position: Sitting, Cuff Size: Normal)   Pulse 74   Ht 5' 10 (1.778 m)   Wt 211 lb 11.2 oz (96 kg)   BMI 30.38 kg/m   EXAM: Deferred  EKG: Deferred  ASSESSMENT: Compound heterozygote for LPL and APOA5, 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   Sergio [name] has a history of pancreatitis and was hospitalized for 2 weeks with triglycerides over 28,000.  He has subsequently adopted a low-fat diet which he is he adhered to and has been able to drive his triglycerides down to 591 recently.  This is not on a background of any statin, fibrate or fish oil therapy which she has tried in the past but said it was either ineffective or did not want to continue with it.  Genetic testing shows he is a  compound heterozygote for LPL and APO A5 which could indicate a familial chylomicronemia syndrome or possibly MCAS.  His Moulin score is 9 and his NAFCS score is 30.  He appears clinically to have FCS.  He appears clinically to have FCS.  Based on this I think he be a good candidate for Tryngolza.  I have outlined the criteria below I believe have been addressed in this progress note.  Will reach out for prior authorization for [facility] therapy.  I am also concerned about his failure for lipid-lowering on traditional therapies for triglycerides, particularly statins which might be indicative of a high LP(a) given the fact that he has 2 gene variants for this.  We will repeat  labs, including NMR, ApoB and LP(a) in 3-4 months,  Tryngolza Criteria:  1. Adherence to low-fat diet 2. List of current and prior TG-lowering therapies - outcomes/therapeutic failures, etc 3. H/O acute pancreatitis/hospitalizations for this 4. Genetic test results OR scoring with either Moulin (>10) or NAFCS (>60)   Plan follow-up in 3-4 months after labs resulted.  Sergio KYM Maxcy, MD, [facility], FNLA, FACP  [place]  [facility]  Medical Director of the Advanced Lipid Disorders &  Cardiovascular Risk Reduction Clinic Diplomate of the American Board of Clinical Lipidology Attending Cardiologist  Direct Dial: [phone]  Fax: [phone]  Website:  [email or web address]  Sergio [name] Sergio [name] 04/09/2024, 2:45 PM

## 2024-04-10 ENCOUNTER — Other Ambulatory Visit (HOSPITAL_COMMUNITY): Payer: Self-pay

## 2024-04-10 ENCOUNTER — Ambulatory Visit: Admitting: Licensed Clinical Social Worker

## 2024-04-10 ENCOUNTER — Telehealth: Payer: Self-pay | Admitting: Pharmacy Technician

## 2024-04-10 DIAGNOSIS — F3341 Major depressive disorder, recurrent, in partial remission: Secondary | ICD-10-CM

## 2024-04-10 DIAGNOSIS — F419 Anxiety disorder, unspecified: Secondary | ICD-10-CM

## 2024-04-10 NOTE — Telephone Encounter (Signed)
 Pharmacy Patient Advocate Encounter   Received notification from staff messages-jocabed that prior authorization for Tryngolza 80MG /0.8ML auto-injectors is required/requested.   Insurance verification completed.   The patient is insured through [facility] .   Per test claim: PA required; PA started via CoverMyMeds. KEY [id number] . Waiting for clinical questions to populate.

## 2024-04-10 NOTE — Telephone Encounter (Signed)
 SABRA

## 2024-04-10 NOTE — Progress Notes (Unsigned)
 THERAPIST PROGRESS NOTE  Session Time: 4-4:55 pm  Participation Level: Active  Behavioral Response: CasualAlertEuthymic  Type of Therapy: Individual Therapy  Treatment Goals addressed:  Active     Anger Management     12/29/23: 100% complete; I think we got it pretty much under control    STG: Sergio [name] will complete at least 80% of assigned homework  (Completed/Met)     Start:  12/06/22    Expected End:  07/04/23    Resolved:  05/22/23      LTG: Sergio [name] will reduce the amount of anger-related incidents/outbursts by 50% as evidenced by self-report (Completed/Met)     Start:  12/06/22    Expected End:  07/04/23    Resolved:  05/22/23      Work with Sergio [name] to track symptoms, triggers, and/or skill use through a mood chart, diary card, or journal     Start:  12/06/22         Educate Sergio [name] on anger management skills and the rationale for learning these skills     Start:  12/06/22         Educate Sergio [name] on relaxation techniques and the rationale for learning these techniques     Start:  12/06/22         Educate Sergio [name] on trigger thoughts (thoughts that trigger an anger response)     Start:  12/06/22         Educate Sergio [name] on the 6 categories of cognitive distortions that promote anger (Blaming, Catastrophizing, Global Labeling, Misattributions, Overgeneralization, Demanding)      Start:  12/06/22         Educate Sergio [name] on coping skills to cope with anger arousal      Start:  12/06/22           Anxiety     STG: Sergio [name] will participate in at least 80% of scheduled individual psychotherapy sessions  (Completed/Met)     Start:  12/06/22    Expected End:  07/04/23    Resolved:  05/22/23      STG: Sergio [name] will complete at least 80% of assigned homework  (Completed/Met)     Start:  12/06/22    Expected End:  07/04/23    Resolved:  05/22/23      STG: Sergio [name] will practice problem solving skills 3 times per week for the next 4 weeks.  (Completed/Met)     Start:   12/06/22    Expected End:  07/04/23    Resolved:  05/22/23      Work with Sergio [name] to identify a minimum of 3 consequences of avoidance.      Start:  12/06/22         Work with Sergio [name] to identify a minimum of 3 alternative coping behaviors to avoidance.      Start:  12/06/22           Anxiety      Reduce overall frequency, intensity and duration of anxiety so that daily functioning is not impaired per pt self report 3 out of 5 sessions.       Anxiety  (Progressing)     Start:  09/07/22    Expected End:  01/13/24      Reduce overall frequency, intensity and duration of anxiety so that daily functioning is not impaired per pt self report 3 out of 5 sessions.        STG: Patient will participate in at least 80% of scheduled individual psychotherapy sessions (Progressing)  Start:  09/07/22    Expected End:  01/13/24         Anxiety      Start:  09/07/22      Will work with the pt using CBT/DBT/REBT techniques to help the pt verbalize an understanding of the cognitive, physiological, and behavioral components of anxiety and its treatment. This will be done by using worksheets, interactive activities, CBT/ABC thought logs, modeling, homework, role playing and journaling. Will work with pt to  learn and implement coping skills that result in a reduction of anxiety and improve daily functioning per pt self report 3 out of 5 documented sessions.           Anxiety      Start:  09/07/22      Learn and Implement coping skills that result in the reduction of anxiety and worry and improve daily functioning per pt self report 3 out of 5 documented sessions.    12/29/23: 75% progress; Identifies tools and tips from therapy have aided in managing sxs.       Work with patient to track symptoms, triggers and/or skill use through a mood chart, diary card, or journal     Start:  09/07/22         Perform psychoeducation regarding anxiety disorders     Start:  09/07/22         Provide  patient with educational information and reading material on anxiety, its causes, and symptoms     Start:  09/07/22         Work with patient to identify the major components of a recent episode of anxiety: physical symptoms, major thoughts and images, and major behaviors they experienced     Start:  09/07/22         Staff will work with patient to identify a minimum of 3 alternative coping behaviors to avoidance     Start:  09/07/22           Communication:     Learn three ways to communicate verbally when angry per pt report 3 out of 5 documented sessions. Be able to express wants and needs through spoken language. Learn to express feelings verbally without acting out per pt report 3 out of 5 documented sessions.   12/29/23: I think that has gotten better and being able to be honest.       Depression     Reduce overall frequency, intensity and duration of depression so that daily functioning is not impaired per pt self report 3 out of 5 sessions documented.       Depression  (Progressing)     Start:  09/07/22    Expected End:  01/13/24      Reduce overall frequency, intensity and duration of depression so that daily functioning is not impaired per pt self report 3 out of 5 sessions documented.    12/29/23: 75% progress; I don't know that it will ever be 100%.      STG: Sergio [name] WILL PARTICIPATE IN AT LEAST 80% OF SCHEDULED INDIVIDUAL PSYCHOTHERAPY SESSIONS (Completed/Met)     Start:  09/07/22    Expected End:  07/04/23    Resolved:  05/22/23      Depression      Start:  09/07/22      Will work with pt to learn and implement calming skills to reduce overall depression and manage depression symptoms. Some of the techniques that will be used during session will be deep breathing exercises, visual  imagery, progressive muscle relaxation, postreduction relative to the benefits of self-care and other breathing exercises.       WORK WITH Sergio [name] TO TRACK SYMPTOMS, TRIGGERS AND/OR  SKILL USE THROUGH A MOOD CHART, DIARY CARD, OR JOURNAL     Start:  09/07/22         PROVIDE Sergio [name] WITH EDUCATIONAL INFORMATION AND READING MATERIAL ON DISSOCIATION, ITS CAUSES, AND SYMPTOMS     Start:  09/07/22         WORK WITH Sergio [name] TO IDENTIFY THE MAJOR COMPONENTS OF A RECENT EPISODE OF DEPRESSION: PHYSICAL SYMPTOMS, MAJOR THOUGHTS AND IMAGES, AND MAJOR BEHAVIORS THEY EXPERIENCED     Start:  09/07/22         REVIEW PLEASE SKILLS (TREAT PHYSICAL ILLNESS, BALANCE EATING, AVOID MOOD-ALTERING SUBSTANCES, BALANCE SLEEP AND GET EXERCISE) WITH Sergio [name]     Start:  09/07/22         Educate patient on: Depression     Start:  09/07/22      Will work with the pt using CBT/DBT/REBT techniques to help the pt verbalize an understanding of the cognitive, physiological, and behavioral components of depression and its treatment. This will be done by using worksheets, interactive activities, CBT/ABC thought logs, modeling, homework, role playing and journaling. Will work with pt to  learn and implement coping skills that result in a reduction of depression and improve daily functioning per pt self report 3 out of 5 documented sessions.          OP Depression       Trauma      Pt will explore coping skills and learn coping and grounding skills to reduce symptoms of PTSD and prepare to handle future stressful situations as evidenced by implementing coping skills per pt report 3 out of 5 documented sessions.    12/29/23: Identifies Sergio [name] would like address his trauma hx moving forward.      Trauma  (Completed/Met)     Start:  09/07/22    Expected End:  07/04/23    Resolved:  05/22/23   Pt will explore coping skills and learn coping and grounding skills to reduce symptoms of PTSD and prepare to handle future stressful situations as evidenced by implementing coping skills per pt report 3 out of 5 documented sessions.        Trauma      Start:  09/07/22      Engage in relaxation techniques such as  yoga, medication, grounding techniques, and deep breathing exercises to decrease overall stress levels and improve ability to manage symptoms of  PTSD as evidenced by pt self report 3 out of 5 sessions.        EDUCATE Sergio [name] ON COMMON REACTIONS TO A TRAUMATIC EXPERIENCE     Start:  09/07/22         WORK WITH Sergio [name] TO CONSTRUCT AN "IN VIVO EXPOSURE HIERARCHY" A LIST OF THE SITUATIONS, PEOPLE, & PLACES THAT Sergio [name] AVOIDS BECAUSE OF THEIR TRAUMA     Start:  09/07/22            ProgressTowards Goals: Progressing  Interventions: {CHL AMB BH Type of Intervention:21022753}  Summary: Sergio [name] is a 48 y.o. male who presents with symptoms of depression. Patient reports symptoms of uncontrollable worry, negative mood, restlessness, irritability. Patient was oriented x 5. Patient was cooperative engaged. Patient denies SI/HI/AVH.    Suicidal/Homicidal: Nowithout intent/plan  Therapist Response: Clinician utilized supportive and active listening to create a safe environment for patient to  process recent life stressors and symptoms.  Patient utilized space to reflect on previous positive experience and ways in which she is utilizing his strengths to feed into his purpose of helping others.   Plan: Return again in 2 weeks.  Diagnosis: MDD (major depressive disorder), recurrent, in partial remission (HCC)  Anxiety disorder, unspecified type   Collaboration of Care: AEB psychiatrist can access notes and cln. Will review psychiatrists' notes. Check in with the patient and will see LCSW per availability. Patient agreed with treatment recommendations.   Patient/Guardian was advised Release of Information must be obtained prior to any record release in order to collaborate their care with an outside provider. Patient/Guardian was advised if they have not already done so to contact the registration department to sign all necessary forms in order for us  to release information regarding their care.    Consent: Patient/Guardian gives verbal consent for treatment and assignment of benefits for services provided during this visit. Patient/Guardian expressed understanding and agreed to proceed.   Evalene KATHEE Husband, LCSW 04/10/2024

## 2024-04-11 ENCOUNTER — Other Ambulatory Visit (HOSPITAL_COMMUNITY): Payer: Self-pay

## 2024-04-11 NOTE — Telephone Encounter (Signed)
 Had to fax form in manually 1:21pm 04/11/24

## 2024-04-12 NOTE — Telephone Encounter (Signed)
   Faxed labs from 08/2023 with triglycerides 1,200 and labs from 12/05/23 with triglycerides 987

## 2024-04-16 NOTE — Telephone Encounter (Signed)
 Sent more information to [employer]

## 2024-04-18 ENCOUNTER — Telehealth: Payer: Self-pay | Admitting: Pharmacy Technician

## 2024-04-18 DIAGNOSIS — H40003 Preglaucoma, unspecified, bilateral: Secondary | ICD-10-CM | POA: Diagnosis not present

## 2024-04-18 NOTE — Telephone Encounter (Signed)
 I called the patient and left a message to see if he can send me the test. Thank you

## 2024-04-18 NOTE — Telephone Encounter (Signed)
 Faxed more information for the appeal of tryngolza

## 2024-04-18 NOTE — Telephone Encounter (Signed)
 Pharmacy Patient Advocate Encounter  Received notification from [facility] that Prior Authorization for Demetria has been DENIED.  Full denial letter will be uploaded to the media tab. See denial reason below.   PA #/Case ID/Reference #: [id number]    I sent in the office visit note stating the findings but I don't see the actual test. Insurance wants the actual test to prove the findings. Is the test somewhere proving this ? then I can appeal the decision, thank you!     Per note:

## 2024-04-18 NOTE — Telephone Encounter (Signed)
 Patient called back and will send me the test results

## 2024-04-18 NOTE — Telephone Encounter (Signed)
 FYI

## 2024-04-23 NOTE — Telephone Encounter (Signed)
 I called [phone] to check on the appeal and they asked me to fax to [phone]. I faxed 8:58am 04/23/24

## 2024-04-26 ENCOUNTER — Ambulatory Visit: Admitting: Licensed Clinical Social Worker

## 2024-04-26 DIAGNOSIS — F3341 Major depressive disorder, recurrent, in partial remission: Secondary | ICD-10-CM | POA: Diagnosis not present

## 2024-04-26 DIAGNOSIS — F419 Anxiety disorder, unspecified: Secondary | ICD-10-CM

## 2024-04-26 NOTE — Progress Notes (Signed)
 THERAPIST PROGRESS NOTE  Session Time: 4-5pm  Participation Level: Active  Behavioral Response: CasualAlertEuthymic  Type of Therapy: Individual Therapy  Treatment Goals addressed:  Active     Anger Management     12/29/23: 100% complete; I think we got it pretty much under control    STG: Akim will complete at least 80% of assigned homework  (Completed/Met)     Start:  12/06/22    Expected End:  07/04/23    Resolved:  05/22/23      LTG: [name] will reduce the amount of anger-related incidents/outbursts by 50% as evidenced by self-report (Completed/Met)     Start:  12/06/22    Expected End:  07/04/23    Resolved:  05/22/23      Work with [name] to track symptoms, triggers, and/or skill use through a mood chart, diary card, or journal     Start:  12/06/22         Educate Rafferty on anger management skills and the rationale for learning these skills     Start:  12/06/22         Educate Dawon on relaxation techniques and the rationale for learning these techniques     Start:  12/06/22         Educate Tywon on trigger thoughts (thoughts that trigger an anger response)     Start:  12/06/22         Educate Mikie on the 6 categories of cognitive distortions that promote anger (Blaming, Catastrophizing, Global Labeling, Misattributions, Overgeneralization, Demanding)      Start:  12/06/22         Educate Toney on coping skills to cope with anger arousal      Start:  12/06/22           Anxiety     STG: Terelle will participate in at least 80% of scheduled individual psychotherapy sessions  (Completed/Met)     Start:  12/06/22    Expected End:  07/04/23    Resolved:  05/22/23      STG: [name] will complete at least 80% of assigned homework  (Completed/Met)     Start:  12/06/22    Expected End:  07/04/23    Resolved:  05/22/23      STG: [name] will practice problem solving skills 3 times per week for the next 4 weeks.  (Completed/Met)     Start:  12/06/22     Expected End:  07/04/23    Resolved:  05/22/23      Work with [name] to identify a minimum of 3 consequences of avoidance.      Start:  12/06/22         Work with [name] to identify a minimum of 3 alternative coping behaviors to avoidance.      Start:  12/06/22           Anxiety      Reduce overall frequency, intensity and duration of anxiety so that daily functioning is not impaired per pt self report 3 out of 5 sessions.       Anxiety  (Progressing)     Start:  09/07/22    Expected End:  01/13/24      Reduce overall frequency, intensity and duration of anxiety so that daily functioning is not impaired per pt self report 3 out of 5 sessions.        STG: Patient will participate in at least 80% of scheduled individual psychotherapy sessions (Progressing)  Start:  09/07/22    Expected End:  01/13/24         Anxiety      Start:  09/07/22      Will work with the pt using CBT/DBT/REBT techniques to help the pt verbalize an understanding of the cognitive, physiological, and behavioral components of anxiety and its treatment. This will be done by using worksheets, interactive activities, CBT/ABC thought logs, modeling, homework, role playing and journaling. Will work with pt to  learn and implement coping skills that result in a reduction of anxiety and improve daily functioning per pt self report 3 out of 5 documented sessions.           Anxiety      Start:  09/07/22      Learn and Implement coping skills that result in the reduction of anxiety and worry and improve daily functioning per pt self report 3 out of 5 documented sessions.    12/29/23: 75% progress; Identifies tools and tips from therapy have aided in managing sxs.       Work with patient to track symptoms, triggers and/or skill use through a mood chart, diary card, or journal     Start:  09/07/22         Perform psychoeducation regarding anxiety disorders     Start:  09/07/22         Provide patient with  educational information and reading material on anxiety, its causes, and symptoms     Start:  09/07/22         Work with patient to identify the major components of a recent episode of anxiety: physical symptoms, major thoughts and images, and major behaviors they experienced     Start:  09/07/22         Staff will work with patient to identify a minimum of 3 alternative coping behaviors to avoidance     Start:  09/07/22           Communication:     Learn three ways to communicate verbally when angry per pt report 3 out of 5 documented sessions. Be able to express wants and needs through spoken language. Learn to express feelings verbally without acting out per pt report 3 out of 5 documented sessions.   12/29/23: I think that has gotten better and being able to be honest.       Depression     Reduce overall frequency, intensity and duration of depression so that daily functioning is not impaired per pt self report 3 out of 5 sessions documented.       Depression  (Progressing)     Start:  09/07/22    Expected End:  01/13/24      Reduce overall frequency, intensity and duration of depression so that daily functioning is not impaired per pt self report 3 out of 5 sessions documented.    12/29/23: 75% progress; I don't know that it will ever be 100%.      STG: Vernie WILL PARTICIPATE IN AT LEAST 80% OF SCHEDULED INDIVIDUAL PSYCHOTHERAPY SESSIONS (Completed/Met)     Start:  09/07/22    Expected End:  07/04/23    Resolved:  05/22/23      Depression      Start:  09/07/22      Will work with pt to learn and implement calming skills to reduce overall depression and manage depression symptoms. Some of the techniques that will be used during session will be deep breathing exercises, visual  imagery, progressive muscle relaxation, postreduction relative to the benefits of self-care and other breathing exercises.       WORK WITH Lavell TO TRACK SYMPTOMS, TRIGGERS AND/OR SKILL USE  THROUGH A MOOD CHART, DIARY CARD, OR JOURNAL     Start:  09/07/22         PROVIDE Kyri WITH EDUCATIONAL INFORMATION AND READING MATERIAL ON DISSOCIATION, ITS CAUSES, AND SYMPTOMS     Start:  09/07/22         WORK WITH Thaddus TO IDENTIFY THE MAJOR COMPONENTS OF A RECENT EPISODE OF DEPRESSION: PHYSICAL SYMPTOMS, MAJOR THOUGHTS AND IMAGES, AND MAJOR BEHAVIORS THEY EXPERIENCED     Start:  09/07/22         REVIEW PLEASE SKILLS (TREAT PHYSICAL ILLNESS, BALANCE EATING, AVOID MOOD-ALTERING SUBSTANCES, BALANCE SLEEP AND GET EXERCISE) WITH Muriel     Start:  09/07/22         Educate patient on: Depression     Start:  09/07/22      Will work with the pt using CBT/DBT/REBT techniques to help the pt verbalize an understanding of the cognitive, physiological, and behavioral components of depression and its treatment. This will be done by using worksheets, interactive activities, CBT/ABC thought logs, modeling, homework, role playing and journaling. Will work with pt to  learn and implement coping skills that result in a reduction of depression and improve daily functioning per pt self report 3 out of 5 documented sessions.          OP Depression       Trauma      Pt will explore coping skills and learn coping and grounding skills to reduce symptoms of PTSD and prepare to handle future stressful situations as evidenced by implementing coping skills per pt report 3 out of 5 documented sessions.    12/29/23: Identifies he would like address his trauma hx moving forward.      Trauma  (Completed/Met)     Start:  09/07/22    Expected End:  07/04/23    Resolved:  05/22/23   Pt will explore coping skills and learn coping and grounding skills to reduce symptoms of PTSD and prepare to handle future stressful situations as evidenced by implementing coping skills per pt report 3 out of 5 documented sessions.        Trauma      Start:  09/07/22      Engage in relaxation techniques such as yoga,  medication, grounding techniques, and deep breathing exercises to decrease overall stress levels and improve ability to manage symptoms of  PTSD as evidenced by pt self report 3 out of 5 sessions.        EDUCATE Landy ON COMMON REACTIONS TO A TRAUMATIC EXPERIENCE     Start:  09/07/22         WORK WITH Abhay TO CONSTRUCT AN "IN VIVO EXPOSURE HIERARCHY" A LIST OF THE SITUATIONS, PEOPLE, & PLACES THAT Lorcan AVOIDS BECAUSE OF THEIR TRAUMA     Start:  09/07/22            ProgressTowards Goals: Progressing  Interventions: CBT, Solution Focused, Assertiveness Training, and Reframing  Summary: CHE RACHAL is a 48 y.o. male who presents with symptoms of depression. Patient reports symptoms of uncontrollable worry, negative mood, restlessness, irritability. Patient was oriented x 5. Patient was cooperative engaged. Patient denies SI/HI/AVH.    The patient utilized the therapeutic space to update his medical goals and reflect on positive changes, noting that he  feels more comfortable with his overall health. He discussed stressors related to changes at work and the impact of having his family fill professional roles after having difficult conversations with his children. The patient also explored strategies for discussing healthy relationships with his children and examined the dynamics of those relationships.  He expressed pride in his ability to problem-solve and in managing his responsibilities more effectively.   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 The clinician worked with the patient on reframing negative thoughts he has about needing to maintain control.  Towards the end of the session, the patient reflected on his progress and expressed a desire to begin processing his unresolved trauma history in future sessions.  Suicidal/Homicidal: Nowithout  intent/plan  Therapist Response:  Clinician utilized supportive and active listening to create a safe environment for patient to process recent life stressors and symptoms.   Explored use of assertive communication and role play to aid in establishing boundaries.  Identified solutions to addressing barriers. Explored patient readiness to process his trauma history.   Plan: Return again in 2 weeks.  Diagnosis: MDD (major depressive disorder), recurrent, in partial remission (HCC)  Anxiety disorder, unspecified type    Collaboration of Care: AEB psychiatrist can access notes and cln. Will review psychiatrists' notes. Check in with the patient and will see LCSW per availability. Patient agreed with treatment recommendations.   Patient/Guardian was advised Release of Information must be obtained prior to any record release in order to collaborate their care with an outside provider. Patient/Guardian was advised if they have not already done so to contact the registration department to sign all necessary forms in order for us  to release information regarding their care.   Consent: Patient/Guardian gives verbal consent for treatment and assignment of benefits for services provided during this visit. Patient/Guardian expressed understanding and agreed to proceed.   Evalene KATHEE Husband, LCSW 04/26/2024

## 2024-04-26 NOTE — Telephone Encounter (Signed)
 [id number] document control number for [facility]. They said they did receive the document on 04/23/24. They said could hear in 72 hours. Called [phone] option 2

## 2024-04-30 NOTE — Telephone Encounter (Addendum)
 Hi, the prior authorization for Sergio [name] was denied stating:   I called [phone] to make sure they got the testing. I scanned the testing and denial in media for reference. They said they received the genetic testing we sent. 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

## 2024-05-07 DIAGNOSIS — H40003 Preglaucoma, unspecified, bilateral: Secondary | ICD-10-CM | POA: Diagnosis not present

## 2024-05-07 DIAGNOSIS — E119 Type 2 diabetes mellitus without complications: Secondary | ICD-10-CM | POA: Diagnosis not present

## 2024-05-09 ENCOUNTER — Institutional Professional Consult (permissible substitution) (HOSPITAL_BASED_OUTPATIENT_CLINIC_OR_DEPARTMENT_OTHER): Admitting: Internal Medicine

## 2024-05-10 ENCOUNTER — Ambulatory Visit: Admitting: Licensed Clinical Social Worker

## 2024-05-10 ENCOUNTER — Telehealth: Payer: Self-pay | Admitting: Internal Medicine

## 2024-05-10 DIAGNOSIS — F3341 Major depressive disorder, recurrent, in partial remission: Secondary | ICD-10-CM

## 2024-05-10 DIAGNOSIS — F419 Anxiety disorder, unspecified: Secondary | ICD-10-CM | POA: Diagnosis not present

## 2024-05-10 NOTE — Telephone Encounter (Signed)
 Calling in about the appeal for the patient. Please advise

## 2024-05-10 NOTE — Telephone Encounter (Signed)
 Spoke to pharmacist at [facility] calling about appeal that was submitted for [employer].Insurance advised to add # [id number] to top of appeal form.Message to to pharmacy team.

## 2024-05-10 NOTE — Progress Notes (Signed)
 THERAPIST PROGRESS NOTE  Session Time: 3-3:32pm  Participation Level: Active  Behavioral Response: CasualAlertEuthymic  Type of Therapy: Individual Therapy  Treatment Goals addressed:  Active     Anger Management     12/29/23: 100% complete; I think we got it pretty much under control    STG: Sergio [name] will complete at least 80% of assigned homework  (Completed/Met)     Start:  12/06/22    Expected End:  07/04/23    Resolved:  05/22/23      LTG: Sergio [name] will reduce the amount of anger-related incidents/outbursts by 50% as evidenced by self-report (Completed/Met)     Start:  12/06/22    Expected End:  07/04/23    Resolved:  05/22/23      Work with Sergio [name] to track symptoms, triggers, and/or skill use through a mood chart, diary card, or journal     Start:  12/06/22         Educate Sergio [name] on anger management skills and the rationale for learning these skills     Start:  12/06/22         Educate Sergio [name] on relaxation techniques and the rationale for learning these techniques     Start:  12/06/22         Educate Sergio [name] on trigger thoughts (thoughts that trigger an anger response)     Start:  12/06/22         Educate Sergio [name] on the 6 categories of cognitive distortions that promote anger (Blaming, Catastrophizing, Global Labeling, Misattributions, Overgeneralization, Demanding)      Start:  12/06/22         Educate Sergio [name] on coping skills to cope with anger arousal      Start:  12/06/22           Anxiety     STG: Sergio [name] will participate in at least 80% of scheduled individual psychotherapy sessions  (Completed/Met)     Start:  12/06/22    Expected End:  07/04/23    Resolved:  05/22/23      STG: Sergio [name] will complete at least 80% of assigned homework  (Completed/Met)     Start:  12/06/22    Expected End:  07/04/23    Resolved:  05/22/23      STG: Sergio [name] will practice problem solving skills 3 times per week for the next 4 weeks.  (Completed/Met)     Start:  12/06/22     Expected End:  07/04/23    Resolved:  05/22/23      Work with Sergio [name] to identify a minimum of 3 consequences of avoidance.      Start:  12/06/22         Work with Sergio [name] to identify a minimum of 3 alternative coping behaviors to avoidance.      Start:  12/06/22           Anxiety      Reduce overall frequency, intensity and duration of anxiety so that daily functioning is not impaired per pt self report 3 out of 5 sessions.       Anxiety  (Progressing)     Start:  09/07/22    Expected End:  01/13/24      Reduce overall frequency, intensity and duration of anxiety so that daily functioning is not impaired per pt self report 3 out of 5 sessions.        STG: Patient will participate in at least 80% of scheduled individual psychotherapy sessions (Progressing)  Start:  09/07/22    Expected End:  01/13/24         Anxiety      Start:  09/07/22      Will work with the pt using CBT/DBT/REBT techniques to help the pt verbalize an understanding of the cognitive, physiological, and behavioral components of anxiety and its treatment. This will be done by using worksheets, interactive activities, CBT/ABC thought logs, modeling, homework, role playing and journaling. Will work with pt to  learn and implement coping skills that result in a reduction of anxiety and improve daily functioning per pt self report 3 out of 5 documented sessions.           Anxiety      Start:  09/07/22      Learn and Implement coping skills that result in the reduction of anxiety and worry and improve daily functioning per pt self report 3 out of 5 documented sessions.    12/29/23: 75% progress; Identifies tools and tips from therapy have aided in managing sxs.       Work with patient to track symptoms, triggers and/or skill use through a mood chart, diary card, or journal     Start:  09/07/22         Perform psychoeducation regarding anxiety disorders     Start:  09/07/22         Provide patient  with educational information and reading material on anxiety, its causes, and symptoms     Start:  09/07/22         Work with patient to identify the major components of a recent episode of anxiety: physical symptoms, major thoughts and images, and major behaviors they experienced     Start:  09/07/22         Staff will work with patient to identify a minimum of 3 alternative coping behaviors to avoidance     Start:  09/07/22           Communication:     Learn three ways to communicate verbally when angry per pt report 3 out of 5 documented sessions. Be able to express wants and needs through spoken language. Learn to express feelings verbally without acting out per pt report 3 out of 5 documented sessions.   12/29/23: I think that has gotten better and being able to be honest.       Depression     Reduce overall frequency, intensity and duration of depression so that daily functioning is not impaired per pt self report 3 out of 5 sessions documented.       Depression  (Progressing)     Start:  09/07/22    Expected End:  01/13/24      Reduce overall frequency, intensity and duration of depression so that daily functioning is not impaired per pt self report 3 out of 5 sessions documented.    12/29/23: 75% progress; I don't know that it will ever be 100%.      STG: Sergio [name] WILL PARTICIPATE IN AT LEAST 80% OF SCHEDULED INDIVIDUAL PSYCHOTHERAPY SESSIONS (Completed/Met)     Start:  09/07/22    Expected End:  07/04/23    Resolved:  05/22/23      Depression      Start:  09/07/22      Will work with pt to learn and implement calming skills to reduce overall depression and manage depression symptoms. Some of the techniques that will be used during session will be deep breathing exercises, visual  imagery, progressive muscle relaxation, postreduction relative to the benefits of self-care and other breathing exercises.       WORK WITH Sergio [name] TO TRACK SYMPTOMS, TRIGGERS AND/OR SKILL  USE THROUGH A MOOD CHART, DIARY CARD, OR JOURNAL     Start:  09/07/22         PROVIDE Sergio [name] WITH EDUCATIONAL INFORMATION AND READING MATERIAL ON DISSOCIATION, ITS CAUSES, AND SYMPTOMS     Start:  09/07/22         WORK WITH Sergio [name] TO IDENTIFY THE MAJOR COMPONENTS OF A RECENT EPISODE OF DEPRESSION: PHYSICAL SYMPTOMS, MAJOR THOUGHTS AND IMAGES, AND MAJOR BEHAVIORS THEY EXPERIENCED     Start:  09/07/22         REVIEW PLEASE SKILLS (TREAT PHYSICAL ILLNESS, BALANCE EATING, AVOID MOOD-ALTERING SUBSTANCES, BALANCE SLEEP AND GET EXERCISE) WITH Johari     Start:  09/07/22         Educate patient on: Depression     Start:  09/07/22      Will work with the pt using CBT/DBT/REBT techniques to help the pt verbalize an understanding of the cognitive, physiological, and behavioral components of depression and its treatment. This will be done by using worksheets, interactive activities, CBT/ABC thought logs, modeling, homework, role playing and journaling. Will work with pt to  learn and implement coping skills that result in a reduction of depression and improve daily functioning per pt self report 3 out of 5 documented sessions.          OP Depression       Trauma      Pt will explore coping skills and learn coping and grounding skills to reduce symptoms of PTSD and prepare to handle future stressful situations as evidenced by implementing coping skills per pt report 3 out of 5 documented sessions.    12/29/23: Identifies he would like address his trauma hx moving forward.      Trauma  (Completed/Met)     Start:  09/07/22    Expected End:  07/04/23    Resolved:  05/22/23   Pt will explore coping skills and learn coping and grounding skills to reduce symptoms of PTSD and prepare to handle future stressful situations as evidenced by implementing coping skills per pt report 3 out of 5 documented sessions.        Trauma      Start:  09/07/22      Engage in relaxation techniques such as yoga,  medication, grounding techniques, and deep breathing exercises to decrease overall stress levels and improve ability to manage symptoms of  PTSD as evidenced by pt self report 3 out of 5 sessions.        EDUCATE [place] ON COMMON REACTIONS TO A TRAUMATIC EXPERIENCE     Start:  09/07/22         WORK WITH Ikaika TO CONSTRUCT AN "IN VIVO EXPOSURE HIERARCHY" A LIST OF THE SITUATIONS, PEOPLE, & PLACES THAT Jamaul AVOIDS BECAUSE OF THEIR TRAUMA     Start:  09/07/22            ProgressTowards Goals: Progressing  Interventions: Strength-based and Supportive  Summary: Sergio [name] is a 48 y.o. male who presents with symptoms of depression. Patient reports symptoms of uncontrollable worry, negative mood, restlessness, irritability. Patient was oriented x 5. Patient was cooperative engaged. Patient denies SI/HI/AVH.    Patient utilized therapeutic space identifying he feels mentally and emotionally burnt out.  Patient identified changes to his medication regimen citing he stopped  taking trazodone  due to anxiety about side effects.  Patient reports this occurred 1 month ago.  Clinician assessed for patient sleep hygiene with patient identifying techniques such as noise cancellation headphones, use of the calm app, and reading before bed.  Patient reports he is working toward 8 hours of sleep at night with occasional 26-minute naps in the middle of the day.  Clinician followed up with patient regarding future of his therapeutic treatment following a conversation related to patient taking a break from therapy.  Patient reports a desire to spread out sessions to once a month with the intention of terminating therapeutic interventions, the fall.  Clinician assessed for patient's readiness to address his trauma history with patient denying readiness at this time.  Clinician assessed for situational instances that he feels could result in him returning to therapeutic services.  Clinician advised patient to reach  back out should he need to resume services.   The clinician readministered the PHQ-9 and GAD-7 assessments. The patient's anxiety scores decreased from 8 to 2, and depression scores also decreased from 8 to 2. The patient shares he is coping well with situational stressors and actively working towards problem solving.    Suicidal/Homicidal: Nowithout intent/plan  Therapist Response: Clinician utilized supportive and active listening to create a safe environment for patient to process recent life stressors and symptoms.  Clinician assessed for current symptoms, safety, stressors since last session.  Plan: Return again in 2 weeks.  Diagnosis: MDD (major depressive disorder), recurrent, in partial remission (HCC)  Anxiety disorder, unspecified type   Collaboration of Care: AEB psychiatrist can access notes and cln. Will review psychiatrists' notes. Check in with the patient and will see LCSW per availability. Patient agreed with treatment recommendations.   Patient/Guardian was advised Release of Information must be obtained prior to any record release in order to collaborate their care with an outside provider. Patient/Guardian was advised if they have not already done so to contact the registration department to sign all necessary forms in order for us  to release information regarding their care.   Consent: Patient/Guardian gives verbal consent for treatment and assignment of benefits for services provided during this visit. Patient/Guardian expressed understanding and agreed to proceed.   Sergio KATHEE Husband, LCSW 05/10/2024

## 2024-05-11 ENCOUNTER — Telehealth: Payer: Self-pay | Admitting: Pharmacy Technician

## 2024-05-11 ENCOUNTER — Other Ambulatory Visit (HOSPITAL_COMMUNITY): Payer: Self-pay

## 2024-05-11 DIAGNOSIS — E781 Pure hyperglyceridemia: Secondary | ICD-10-CM

## 2024-05-11 DIAGNOSIS — Z79899 Other long term (current) drug therapy: Secondary | ICD-10-CM

## 2024-05-11 NOTE — Telephone Encounter (Signed)
 Clarified with tech that reference number [id number] was sent with appeal. Appeal status is pending.

## 2024-05-11 NOTE — Telephone Encounter (Signed)
 [facility] called back and said we needed to get another denial before they can help him with assistance. They will send me a form to fill out and send in to [facility]   reference number [id number]

## 2024-05-11 NOTE — Telephone Encounter (Signed)
 I spoke to [employer] and they said they did give him the medication for free while waiting on the prior authorization so he has been taking it. They are going to let us  know if there is anything they can do to try to get this approved or get this free from the manufacturer for the patient. They will call me back once they hear   [facility] phone number is [phone]

## 2024-05-11 NOTE — Telephone Encounter (Signed)
 Faxed appeal to [facility] fax [phone]  05/11/24 3:48p

## 2024-05-16 NOTE — Telephone Encounter (Signed)
 I called [phone] -[facility] and they said all they can see is the denial. She told me to call [employer] member services to chk on appeal. I called [phone] [employer] and hit option 2 =there-she could see I sent the appeal- but they told me to call [phone] to get appeal update. However, at that number -they said I need to call [phone] =but that number is [facility] and [facility] does not handle the appeal. I called [phone] [employer] member [employer] again and hit option 5 to chk on appeal. There -they said as of yesterday they have it routed to the appeal team but there has not been an appeal case launched yet.   [id number] is the document control number now they said. reference number was [id number]   They said I need to get the patient to sign the authorization form to submit the appeal on their behalf. They said they have to receive that and then they will launch the appeal case. Right now they show it is still pending due to waiting on that form. They said wait 72 hours after sending authorization form to see if the appeal case got launched. She said to attach [facility] [id number] because that has the most complete version of the appeal. She is faxing me the authorization form.   I got form. I lmom for pt to call me back so I can send him the form to sign so I can send to [employer]

## 2024-05-16 NOTE — Telephone Encounter (Signed)
 Faxed authorization to [employer] at [phone]. Patient aware. He said he is due for a shot on 05/25/24 and [facility] is going to go ahead and send him a shot now so he doesn't miss a dose.

## 2024-05-16 NOTE — Telephone Encounter (Signed)
 Spoke to [employer] and said they still do not have auth please fax to this fax number [phone]

## 2024-05-16 NOTE — Telephone Encounter (Signed)
 Faxed to [phone] at 5:07p 05/16/24  Faxed again 8:18am 05/17/24

## 2024-05-17 NOTE — Telephone Encounter (Signed)
 SABRA

## 2024-05-17 NOTE — Telephone Encounter (Signed)
 After sending to the fax number [facility] wanted us  to send to, they wrote back saying [employer] handles the appeal -which is who I sent the appeals to on 05/16/24 @4 :11pm. [employer] is working on [employer]

## 2024-05-23 NOTE — Telephone Encounter (Signed)
 Appeal case number is [id number]. The appeal has a due date of 06/10/24. They said they received my fax from 05/16/24. They said I can call 5 days before the due date to check on the status if we have not heard the decision before then. [phone] option 5

## 2024-05-30 ENCOUNTER — Other Ambulatory Visit (HOSPITAL_COMMUNITY): Payer: Self-pay

## 2024-05-30 ENCOUNTER — Ambulatory Visit: Admitting: Licensed Clinical Social Worker

## 2024-05-30 DIAGNOSIS — F3342 Major depressive disorder, recurrent, in full remission: Secondary | ICD-10-CM | POA: Diagnosis not present

## 2024-05-30 DIAGNOSIS — F419 Anxiety disorder, unspecified: Secondary | ICD-10-CM

## 2024-05-30 DIAGNOSIS — E781 Pure hyperglyceridemia: Secondary | ICD-10-CM | POA: Diagnosis not present

## 2024-05-30 NOTE — Progress Notes (Signed)
 THERAPIST PROGRESS NOTE  Session Time: 10:97m-10:43am  Participation Level: Active  Behavioral Response: CasualAlertEuthymic  Type of Therapy: Individual Therapy  Treatment Goals addressed:  Active     Anger Management     12/29/23: 100% complete; I think we got it pretty much under control    STG: Sergio [name] will complete at least 80% of assigned homework  (Completed/Met)     Start:  12/06/22    Expected End:  07/04/23    Resolved:  05/22/23      LTG: Sergio [name] will reduce the amount of anger-related incidents/outbursts by 50% as evidenced by self-report (Completed/Met)     Start:  12/06/22    Expected End:  07/04/23    Resolved:  05/22/23      Work with Sergio [name] to track symptoms, triggers, and/or skill use through a mood chart, diary card, or journal     Start:  12/06/22         Educate Sergio [name] on anger management skills and the rationale for learning these skills     Start:  12/06/22         Educate Sergio [name] on relaxation techniques and the rationale for learning these techniques     Start:  12/06/22         Educate Sergio [name] on trigger thoughts (thoughts that trigger an anger response)     Start:  12/06/22         Educate Sergio [name] on the 6 categories of cognitive distortions that promote anger (Blaming, Catastrophizing, Global Labeling, Misattributions, Overgeneralization, Demanding)      Start:  12/06/22         Educate Sergio [name] on coping skills to cope with anger arousal      Start:  12/06/22           Anxiety     STG: Sergio [name] will participate in at least 80% of scheduled individual psychotherapy sessions  (Completed/Met)     Start:  12/06/22    Expected End:  07/04/23    Resolved:  05/22/23      STG: Sergio [name] will complete at least 80% of assigned homework  (Completed/Met)     Start:  12/06/22    Expected End:  07/04/23    Resolved:  05/22/23      STG: Sergio [name] will practice problem solving skills 3 times per week for the next 4 weeks.  (Completed/Met)     Start:   12/06/22    Expected End:  07/04/23    Resolved:  05/22/23      Work with Sergio [name] to identify a minimum of 3 consequences of avoidance.      Start:  12/06/22         Work with Sergio [name] to identify a minimum of 3 alternative coping behaviors to avoidance.      Start:  12/06/22           Anxiety      Reduce overall frequency, intensity and duration of anxiety so that daily functioning is not impaired per pt self report 3 out of 5 sessions.       Anxiety  (Progressing)     Start:  09/07/22    Expected End:  01/13/24      Reduce overall frequency, intensity and duration of anxiety so that daily functioning is not impaired per pt self report 3 out of 5 sessions.        STG: Patient will participate in at least 80% of scheduled individual psychotherapy sessions (Progressing)  Start:  09/07/22    Expected End:  01/13/24         Anxiety      Start:  09/07/22      Will work with the pt using CBT/DBT/REBT techniques to help the pt verbalize an understanding of the cognitive, physiological, and behavioral components of anxiety and its treatment. This will be done by using worksheets, interactive activities, CBT/ABC thought logs, modeling, homework, role playing and journaling. Will work with pt to  learn and implement coping skills that result in a reduction of anxiety and improve daily functioning per pt self report 3 out of 5 documented sessions.           Anxiety      Start:  09/07/22      Learn and Implement coping skills that result in the reduction of anxiety and worry and improve daily functioning per pt self report 3 out of 5 documented sessions.    12/29/23: 75% progress; Identifies tools and tips from therapy have aided in managing sxs.       Work with patient to track symptoms, triggers and/or skill use through a mood chart, diary card, or journal     Start:  09/07/22         Perform psychoeducation regarding anxiety disorders     Start:  09/07/22         Provide  patient with educational information and reading material on anxiety, its causes, and symptoms     Start:  09/07/22         Work with patient to identify the major components of a recent episode of anxiety: physical symptoms, major thoughts and images, and major behaviors they experienced     Start:  09/07/22         Staff will work with patient to identify a minimum of 3 alternative coping behaviors to avoidance     Start:  09/07/22           Communication:     Learn three ways to communicate verbally when angry per pt report 3 out of 5 documented sessions. Be able to express wants and needs through spoken language. Learn to express feelings verbally without acting out per pt report 3 out of 5 documented sessions.   12/29/23: I think that has gotten better and being able to be honest.       Depression     Reduce overall frequency, intensity and duration of depression so that daily functioning is not impaired per pt self report 3 out of 5 sessions documented.       Depression  (Progressing)     Start:  09/07/22    Expected End:  01/13/24      Reduce overall frequency, intensity and duration of depression so that daily functioning is not impaired per pt self report 3 out of 5 sessions documented.    12/29/23: 75% progress; I don't know that it will ever be 100%.      STG: Sergio [name] WILL PARTICIPATE IN AT LEAST 80% OF SCHEDULED INDIVIDUAL PSYCHOTHERAPY SESSIONS (Completed/Met)     Start:  09/07/22    Expected End:  07/04/23    Resolved:  05/22/23      Depression      Start:  09/07/22      Will work with pt to learn and implement calming skills to reduce overall depression and manage depression symptoms. Some of the techniques that will be used during session will be deep breathing exercises, visual  imagery, progressive muscle relaxation, postreduction relative to the benefits of self-care and other breathing exercises.       WORK WITH Sergio [name] TO TRACK SYMPTOMS, TRIGGERS AND/OR  SKILL USE THROUGH A MOOD CHART, DIARY CARD, OR JOURNAL     Start:  09/07/22         PROVIDE Sergio [name] WITH EDUCATIONAL INFORMATION AND READING MATERIAL ON DISSOCIATION, ITS CAUSES, AND SYMPTOMS     Start:  09/07/22         WORK WITH Sergio [name] TO IDENTIFY THE MAJOR COMPONENTS OF A RECENT EPISODE OF DEPRESSION: PHYSICAL SYMPTOMS, MAJOR THOUGHTS AND IMAGES, AND MAJOR BEHAVIORS THEY EXPERIENCED     Start:  09/07/22         REVIEW PLEASE SKILLS (TREAT PHYSICAL ILLNESS, BALANCE EATING, AVOID MOOD-ALTERING SUBSTANCES, BALANCE SLEEP AND GET EXERCISE) WITH Sergio [name]     Start:  09/07/22         Educate patient on: Depression     Start:  09/07/22      Will work with the pt using CBT/DBT/REBT techniques to help the pt verbalize an understanding of the cognitive, physiological, and behavioral components of depression and its treatment. This will be done by using worksheets, interactive activities, CBT/ABC thought logs, modeling, homework, role playing and journaling. Will work with pt to  learn and implement coping skills that result in a reduction of depression and improve daily functioning per pt self report 3 out of 5 documented sessions.          OP Depression       Trauma      Pt will explore coping skills and learn coping and grounding skills to reduce symptoms of PTSD and prepare to handle future stressful situations as evidenced by implementing coping skills per pt report 3 out of 5 documented sessions.    12/29/23: Identifies he would like address his trauma hx moving forward.      Trauma  (Completed/Met)     Start:  09/07/22    Expected End:  07/04/23    Resolved:  05/22/23   Pt will explore coping skills and learn coping and grounding skills to reduce symptoms of PTSD and prepare to handle future stressful situations as evidenced by implementing coping skills per pt report 3 out of 5 documented sessions.        Trauma      Start:  09/07/22      Engage in relaxation techniques such as  yoga, medication, grounding techniques, and deep breathing exercises to decrease overall stress levels and improve ability to manage symptoms of  PTSD as evidenced by pt self report 3 out of 5 sessions.        EDUCATE Sergio [name] ON COMMON REACTIONS TO A TRAUMATIC EXPERIENCE     Start:  09/07/22         WORK WITH Sergio [name] TO CONSTRUCT AN "IN VIVO EXPOSURE HIERARCHY" A LIST OF THE SITUATIONS, PEOPLE, & PLACES THAT Sergio [name] AVOIDS BECAUSE OF THEIR TRAUMA     Start:  09/07/22            ProgressTowards Goals: Progressing  Interventions: Solution Focused, Assertiveness Training, and Supportive  Summary: Sergio [name] is a 48 y.o. male who presents with symptoms of depression. Patient reports symptoms of uncontrollable worry, negative mood, restlessness, irritability. Patient was oriented x 5. Patient was cooperative engaged. Patient denies SI/HI/AVH.     Patient continues to report marked improvement with anxiety and depression citing he is coping well.  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  Patient identified the impact of feeling he is prioritizing the care of others at the expense of his family needs.  Addressed ways in which he can improve communication and use of barriers with business partners.  Clinician and patient worked together to Paediatric nurse use of assertive communication while also processing possible solutions to ensuring values of both parties are met within the business venture.  Patient has 1 more appointment scheduled but will determine if he needs to keep his scheduled appointment based on continual progress.  Patient as of today's session for reports he feels ready to terminate services based on progress.  Suicidal/Homicidal: Nowithout intent/plan  Therapist Response: Clinician utilized supportive and active listening to create a safe environment for patient to process recent life stressors and symptoms.  Clinician  assessed for current symptoms, safety, stressors since last session.  Role-played ways in which patient can utilize assertive communication and work towards identifying possible solutions to address anxiety around financial strains.    Plan: Return again in 4 weeks.  Diagnosis: MDD (major depressive disorder), recurrent, in full remission (HCC)  Anxiety disorder, unspecified type   Collaboration of Care: Check in with the patient and will see LCSW per availability. Patient agreed with treatment recommendations.   Patient/Guardian was advised Release of Information must be obtained prior to any record release in order to collaborate their care with an outside provider. Patient/Guardian was advised if they have not already done so to contact the registration department to sign all necessary forms in order for us  to release information regarding their care.   Consent: Patient/Guardian gives verbal consent for treatment and assignment of benefits for services provided during this visit. Patient/Guardian expressed understanding and agreed to proceed.   Evalene KATHEE Husband, LCSW 05/30/2024

## 2024-06-05 ENCOUNTER — Other Ambulatory Visit (HOSPITAL_COMMUNITY): Payer: Self-pay

## 2024-06-06 ENCOUNTER — Other Ambulatory Visit (HOSPITAL_COMMUNITY): Payer: Self-pay

## 2024-06-06 NOTE — Telephone Encounter (Signed)
 I called and spoke to [employer] and they said there was a hearing yesterday at 2pm but they told me it was not something the patient nor the provider could of attended. I told [employer] we were not aware of this meeting until yesterday when the patient called after he received a letter in the mail. They said we will hear by 06/10/24 the decision that was made. I called the patient and made him aware. He said the letter he received said he could of attended the meeting and/or had his provider to attend.  Appeal case number is [id number]    [phone] option 5

## 2024-06-06 NOTE — Telephone Encounter (Signed)
 Office called to say they are going to ref fax the appeal instruction. Please advise

## 2024-06-07 DIAGNOSIS — Z08 Encounter for follow-up examination after completed treatment for malignant neoplasm: Secondary | ICD-10-CM | POA: Diagnosis not present

## 2024-06-07 DIAGNOSIS — D2272 Melanocytic nevi of left lower limb, including hip: Secondary | ICD-10-CM | POA: Diagnosis not present

## 2024-06-07 DIAGNOSIS — D2262 Melanocytic nevi of left upper limb, including shoulder: Secondary | ICD-10-CM | POA: Diagnosis not present

## 2024-06-07 DIAGNOSIS — Z86006 Personal history of melanoma in-situ: Secondary | ICD-10-CM | POA: Diagnosis not present

## 2024-06-07 DIAGNOSIS — D225 Melanocytic nevi of trunk: Secondary | ICD-10-CM | POA: Diagnosis not present

## 2024-06-07 DIAGNOSIS — D2271 Melanocytic nevi of right lower limb, including hip: Secondary | ICD-10-CM | POA: Diagnosis not present

## 2024-06-07 DIAGNOSIS — L821 Other seborrheic keratosis: Secondary | ICD-10-CM | POA: Diagnosis not present

## 2024-06-07 DIAGNOSIS — D2261 Melanocytic nevi of right upper limb, including shoulder: Secondary | ICD-10-CM | POA: Diagnosis not present

## 2024-06-12 ENCOUNTER — Other Ambulatory Visit (HOSPITAL_COMMUNITY): Payer: Self-pay

## 2024-06-13 ENCOUNTER — Ambulatory Visit: Admitting: Licensed Clinical Social Worker

## 2024-06-13 NOTE — Telephone Encounter (Signed)
 Sergio [name] said they are sending me the appeal denial. I will send to [facility] and see what they say

## 2024-06-13 NOTE — Telephone Encounter (Signed)
 Hi, his appeal for tryngolza was denied. I called [facility]- and they said they will see if they can still get him the medication since the appeal was denied. He wanted me to let you know the appeal was denied. There was a hearing [employer] had that none of us  was aware of until after the fact.         Appeal denial scanned in. I called [facility] to let them know we got the appeal denial and asked where to fax them the denial. Faxed to: [phone]   I called the patient and made him aware of the appeal denial. He asked for the appeal denial so I emailed it to him.

## 2024-06-14 ENCOUNTER — Telehealth: Payer: Self-pay | Admitting: Pharmacy Technician

## 2024-06-14 NOTE — Telephone Encounter (Signed)
 Hi, just letting you know that he has been approved for patient assistance for tryngloza through [facility]. So he will continue to receive this at no cost to him. I spoke to the patient to let him know about patient assistance and he said he has not missed a dose, his last injection was 05/22/24 and he is set to be receiving this again from [facility]. He wanted me to let you know he just got his labs done at [facility] on 05/30/24 and his triglycerides are down to 153.

## 2024-06-14 NOTE — Telephone Encounter (Signed)
    Patient notified via telephone and provider made aware with message.

## 2024-06-20 NOTE — Addendum Note (Signed)
 Addended by: LORING ANDRIETTE HERO on: 06/20/2024 10:06 AM   Modules accepted: Orders

## 2024-06-20 NOTE — Telephone Encounter (Signed)
 CBC ordered for patient, per MD Yes .SABRA Tryngolza can decrease platelets, however, there was no associated increased bleeding risk in the trial.  Ok to add CBC to lipid profile in 3 months.   Dr. VEAR

## 2024-06-27 ENCOUNTER — Ambulatory Visit: Admitting: Licensed Clinical Social Worker

## 2024-06-27 DIAGNOSIS — F3342 Major depressive disorder, recurrent, in full remission: Secondary | ICD-10-CM | POA: Diagnosis not present

## 2024-06-27 NOTE — Progress Notes (Signed)
 THERAPIST PROGRESS NOTE  Session Time: 3:05-3:51pm  Participation Level: Active  Behavioral Response: CasualAlertEuthymic  Type of Therapy: Individual Therapy  Treatment Goals addressed:  Active     Anxiety      Reduce overall frequency, intensity and duration of anxiety so that daily functioning is not impaired per pt self report 3 out of 5 sessions.       Anxiety  (Completed/Met)     Start:  09/07/22    Expected End:  01/13/24    Resolved:  06/27/24   Reduce overall frequency, intensity and duration of anxiety so that daily functioning is not impaired per pt self report 3 out of 5 sessions.        STG: Patient will participate in at least 80% of scheduled individual psychotherapy sessions (Completed/Met)     Start:  09/07/22    Expected End:  01/13/24    Resolved:  06/27/24      Anxiety      Start:  09/07/22      Will work with the pt using CBT/DBT/REBT techniques to help the pt verbalize an understanding of the cognitive, physiological, and behavioral components of anxiety and its treatment. This will be done by using worksheets, interactive activities, CBT/ABC thought logs, modeling, homework, role playing and journaling. Will work with pt to  learn and implement coping skills that result in a reduction of anxiety and improve daily functioning per pt self report 3 out of 5 documented sessions.           Anxiety      Start:  09/07/22      Learn and Implement coping skills that result in the reduction of anxiety and worry and improve daily functioning per pt self report 3 out of 5 documented sessions.    12/29/23: 75% progress; Identifies tools and tips from therapy have aided in managing sxs.       Work with patient to track symptoms, triggers and/or skill use through a mood chart, diary card, or journal     Start:  09/07/22         Perform psychoeducation regarding anxiety disorders     Start:  09/07/22         Provide patient with educational information  and reading material on anxiety, its causes, and symptoms     Start:  09/07/22         Work with patient to identify the major components of a recent episode of anxiety: physical symptoms, major thoughts and images, and major behaviors they experienced     Start:  09/07/22         Staff will work with patient to identify a minimum of 3 alternative coping behaviors to avoidance     Start:  09/07/22           Depression     Reduce overall frequency, intensity and duration of depression so that daily functioning is not impaired per pt self report 3 out of 5 sessions documented.       Depression  (Completed/Met)     Start:  09/07/22    Expected End:  01/13/24    Resolved:  06/27/24   Reduce overall frequency, intensity and duration of depression so that daily functioning is not impaired per pt self report 3 out of 5 sessions documented.    12/29/23: 75% progress; I don't know that it will ever be 100%.      STG: Sergio [name] WILL PARTICIPATE IN AT LEAST 80% OF  SCHEDULED INDIVIDUAL PSYCHOTHERAPY SESSIONS (Completed/Met)     Start:  09/07/22    Expected End:  07/04/23    Resolved:  05/22/23      Depression      Start:  09/07/22      Will work with pt to learn and implement calming skills to reduce overall depression and manage depression symptoms. Some of the techniques that will be used during session will be deep breathing exercises, visual imagery, progressive muscle relaxation, postreduction relative to the benefits of self-care and other breathing exercises.       WORK WITH Sergio [name] TO TRACK SYMPTOMS, TRIGGERS AND/OR SKILL USE THROUGH A MOOD CHART, DIARY CARD, OR JOURNAL     Start:  09/07/22         PROVIDE Sergio [name] WITH EDUCATIONAL INFORMATION AND READING MATERIAL ON DISSOCIATION, ITS CAUSES, AND SYMPTOMS     Start:  09/07/22         WORK WITH Sergio [name] TO IDENTIFY THE MAJOR COMPONENTS OF A RECENT EPISODE OF DEPRESSION: PHYSICAL SYMPTOMS, MAJOR THOUGHTS AND IMAGES, AND MAJOR  BEHAVIORS THEY EXPERIENCED     Start:  09/07/22         REVIEW PLEASE SKILLS (TREAT PHYSICAL ILLNESS, BALANCE EATING, AVOID MOOD-ALTERING SUBSTANCES, BALANCE SLEEP AND GET EXERCISE) WITH Sergio [name]     Start:  09/07/22         Educate patient on: Depression     Start:  09/07/22      Will work with the pt using CBT/DBT/REBT techniques to help the pt verbalize an understanding of the cognitive, physiological, and behavioral components of depression and its treatment. This will be done by using worksheets, interactive activities, CBT/ABC thought logs, modeling, homework, role playing and journaling. Will work with pt to  learn and implement coping skills that result in a reduction of depression and improve daily functioning per pt self report 3 out of 5 documented sessions.          OP Depression        ProgressTowards Goals: Met  Interventions: Strength-based and Supportive  Summary: Sergio [name] is a 48 y.o. male who presents with symptoms of depression. Patient reports symptoms of uncontrollable worry, negative mood, restlessness, irritability. Patient was oriented x 5. Patient was cooperative engaged. Patient denies SI/HI/AVH.    Cln utilized the first half of session to review patients progress. See progress notes documented above. The patient reports he has learned the significance of open dialogue and expressing his emotions.  Patient is also acknowledge the significance of holding others accountable through establishment of boundaries.  Patient identified prior to beginning therapeutic services he believes his boundaries were not respected nor enforced correctly.  Patient also notes improvements with understanding he does not have to be in control all the time as well as intentionally carving out time to prioritize self-care.  The clinician readministered the PHQ-9 and GAD-7 assessments. Patients scores remained low enough to no qualify for diagnosis of GAD and MDD.   Based on  patient's progress, patient agrees to terminate therapeutic services.  Clinician updated patient's treatment plan based on completion of current therapeutic goals.  Resources were shared with the patient and patient was reminded that he can always resume services should symptoms resurface.  Reflected on coping skills and reviewed patient's supportive network.  Suicidal/Homicidal: Nowithout intent/plan  Therapist Response: Clinician utilized supportive and active listening to create a safe environment for patient to process recent life stressors and symptoms. Clinician assessed for current symptoms, safety, stressors  since last session.  Clinician reflected on patient's progress and takeaways from therapeutic interventions.  Updated patient's treatment plan and reminded him of future resources should he need to resume therapeutic services.  Plan: Based on patient's progress, he and the clinician have decided to terminate services.  Patient was reminded should he need to return to services he can call and schedule with clinician.  Diagnosis: MDD (major depressive disorder), recurrent, in full remission (HCC)  Patient/Guardian was advised Release of Information must be obtained prior to any record release in order to collaborate their care with an outside provider. Patient/Guardian was advised if they have not already done so to contact the registration department to sign all necessary forms in order for us  to release information regarding their care.   Consent: Patient/Guardian gives verbal consent for treatment and assignment of benefits for services provided during this visit. Patient/Guardian expressed understanding and agreed to proceed.   Evalene KATHEE Husband, LCSW 06/27/2024

## 2024-07-25 ENCOUNTER — Encounter (HOSPITAL_BASED_OUTPATIENT_CLINIC_OR_DEPARTMENT_OTHER): Payer: Self-pay | Admitting: Internal Medicine

## 2024-07-25 LAB — CBC
Hematocrit: 45.8 % (ref 37.5–51.0)
Hemoglobin: 14.9 g/dL (ref 13.0–17.7)
MCH: 28.1 pg (ref 26.6–33.0)
MCHC: 32.5 g/dL (ref 31.5–35.7)
MCV: 86 fL (ref 79–97)
Platelets: 182 x10E3/uL (ref 150–450)
RBC: 5.31 x10E6/uL (ref 4.14–5.80)
RDW: 13.1 % (ref 11.6–15.4)
WBC: 5.1 x10E3/uL (ref 3.4–10.8)

## 2024-07-25 LAB — NMR, LIPOPROFILE
Cholesterol, Total: 162 mg/dL (ref 100–199)
HDL Particle Number: 23.5 umol/L — ABNORMAL LOW (ref >=30.5)
HDL-C: 26 mg/dL — ABNORMAL LOW (ref >39)
LDL Particle Number: 1429 nmol/L — ABNORMAL HIGH (ref <1000)
LDL Size: 20 nm — ABNORMAL LOW (ref >20.5)
LDL-C (NIH Calc): 103 mg/dL — ABNORMAL HIGH (ref 0–99)
LP-IR Score: 87 — ABNORMAL HIGH (ref <=45)
Small LDL Particle Number: 1007 nmol/L — ABNORMAL HIGH (ref <=527)
Triglycerides: 185 mg/dL — ABNORMAL HIGH (ref 0–149)

## 2024-07-25 LAB — LIPOPROTEIN A (LPA): Lipoprotein (a): 214.7 nmol/L — ABNORMAL HIGH (ref <75.0)

## 2024-07-25 LAB — APOLIPOPROTEIN B: Apolipoprotein B: 98 mg/dL — ABNORMAL HIGH (ref <90)

## 2024-07-26 DIAGNOSIS — E781 Pure hyperglyceridemia: Secondary | ICD-10-CM | POA: Diagnosis not present

## 2024-08-05 ENCOUNTER — Ambulatory Visit: Payer: Self-pay | Admitting: Internal Medicine

## 2024-08-05 DIAGNOSIS — E781 Pure hyperglyceridemia: Secondary | ICD-10-CM

## 2024-08-06 ENCOUNTER — Other Ambulatory Visit (HOSPITAL_COMMUNITY): Payer: Self-pay

## 2024-08-06 ENCOUNTER — Telehealth: Payer: Self-pay | Admitting: Pharmacy Technician

## 2024-08-06 NOTE — Telephone Encounter (Signed)
 Pharmacy Patient Advocate Encounter   Received notification from Physician's Office that prior authorization for Repatha is required/requested.   Insurance verification completed.   The patient is insured through [employer].   Per test claim: PA required; PA submitted to above mentioned insurance via Latent Key/confirmation #/EOC faxed  Status is pending

## 2024-08-06 NOTE — Telephone Encounter (Signed)
 [employer] cancelled PA-  Must use preferred pharmacy or contact [employer]

## 2024-08-15 MED ORDER — EZETIMIBE 10 MG PO TABS: 10.0000 mg | ORAL_TABLET | Freq: Every day | ORAL | 3 refills | Status: AC

## 2024-10-08 NOTE — Addendum Note (Signed)
 Addended by: LORING ANDRIETTE HERO on: 10/08/2024 04:22 PM   Modules accepted: Orders

## 2024-10-19 ENCOUNTER — Telehealth: Payer: Self-pay | Admitting: Internal Medicine

## 2024-10-19 NOTE — Telephone Encounter (Signed)
 Johnnie with [facility] calling to ask that preauth be initiated for pryngolza. Please advise  [phone]

## 2024-10-22 ENCOUNTER — Other Ambulatory Visit (HOSPITAL_COMMUNITY): Payer: Self-pay

## 2024-10-22 ENCOUNTER — Telehealth: Payer: Self-pay | Admitting: Pharmacy Technician

## 2024-10-22 NOTE — Telephone Encounter (Signed)
 PA request has been Submitted. New Encounter has been or will be created for follow up. For additional info see Pharmacy Prior Auth telephone encounter from 10/22/24.

## 2024-10-22 NOTE — Telephone Encounter (Signed)
" ° ° °  This was denied back in sept at [employer] and he has been receiving patient assistance. He now has new insurance and we have to try a prior authorization on that. He will still receive assistance until we hear from his new insurance per [employer].   Pharmacy Patient Advocate Encounter   Received notification from Pt Calls Messages that prior authorization for [facility] is required/requested.   Insurance verification completed.   The patient is insured through [employer].   Per test claim: PA required; PA submitted to above mentioned insurance via Latent Key/confirmation #/EOC [facility] Status is pending  "

## 2024-10-25 NOTE — Telephone Encounter (Signed)
 Pharmacy Patient Advocate Encounter  Received notification from [employer] that Prior Authorization for tryngolza has been APPROVED from 10/24/24 to 10/24/25   PA #/Case ID/Reference #: [id number]
